# Patient Record
Sex: Female | Born: 1940
Health system: Southern US, Community
[De-identification: ages and names within clinical notes are randomized; demographics above are authoritative.]

## PROBLEM LIST (undated history)

## (undated) DIAGNOSIS — I639 Cerebral infarction, unspecified: Secondary | ICD-10-CM

## (undated) DIAGNOSIS — F419 Anxiety disorder, unspecified: Secondary | ICD-10-CM

## (undated) DIAGNOSIS — E7800 Pure hypercholesterolemia, unspecified: Secondary | ICD-10-CM

## (undated) DIAGNOSIS — I10 Essential (primary) hypertension: Secondary | ICD-10-CM

## (undated) DIAGNOSIS — K219 Gastro-esophageal reflux disease without esophagitis: Secondary | ICD-10-CM

## (undated) DIAGNOSIS — R011 Cardiac murmur, unspecified: Secondary | ICD-10-CM

## (undated) DIAGNOSIS — H9192 Unspecified hearing loss, left ear: Secondary | ICD-10-CM

## (undated) DIAGNOSIS — E039 Hypothyroidism, unspecified: Secondary | ICD-10-CM

## (undated) DIAGNOSIS — M1990 Unspecified osteoarthritis, unspecified site: Secondary | ICD-10-CM

## (undated) DIAGNOSIS — I350 Nonrheumatic aortic (valve) stenosis: Secondary | ICD-10-CM

## (undated) DIAGNOSIS — R112 Nausea with vomiting, unspecified: Secondary | ICD-10-CM

## (undated) DIAGNOSIS — C801 Malignant (primary) neoplasm, unspecified: Secondary | ICD-10-CM

## (undated) DIAGNOSIS — K5792 Diverticulitis of intestine, part unspecified, without perforation or abscess without bleeding: Secondary | ICD-10-CM

## (undated) DIAGNOSIS — F32A Depression, unspecified: Secondary | ICD-10-CM

## (undated) DIAGNOSIS — G5600 Carpal tunnel syndrome, unspecified upper limb: Secondary | ICD-10-CM

## (undated) DIAGNOSIS — F329 Major depressive disorder, single episode, unspecified: Secondary | ICD-10-CM

## (undated) DIAGNOSIS — F0781 Postconcussional syndrome: Secondary | ICD-10-CM

## (undated) DIAGNOSIS — R413 Other amnesia: Secondary | ICD-10-CM

## (undated) DIAGNOSIS — Z9889 Other specified postprocedural states: Secondary | ICD-10-CM

## (undated) HISTORY — PX: EYE SURGERY: SHX253

## (undated) HISTORY — DX: Diverticulitis of intestine, part unspecified, without perforation or abscess without bleeding: K57.92

## (undated) HISTORY — DX: Hypothyroidism, unspecified: E03.9

## (undated) HISTORY — PX: CHOLECYSTECTOMY: SHX55

## (undated) HISTORY — PX: CARDIAC CATHETERIZATION: SHX172

---

## 1984-02-19 HISTORY — PX: ABDOMINAL HYSTERECTOMY: SHX81

## 1993-02-18 HISTORY — PX: OTHER SURGICAL HISTORY: SHX169

## 1999-02-19 HISTORY — PX: OTHER SURGICAL HISTORY: SHX169

## 1999-10-26 ENCOUNTER — Ambulatory Visit (HOSPITAL_BASED_OUTPATIENT_CLINIC_OR_DEPARTMENT_OTHER): Admission: RE | Admit: 1999-10-26 | Discharge: 1999-10-26 | Payer: Self-pay | Admitting: Neurosurgery

## 1999-11-27 ENCOUNTER — Ambulatory Visit (HOSPITAL_COMMUNITY): Admission: RE | Admit: 1999-11-27 | Discharge: 1999-11-27 | Payer: Self-pay | Admitting: Neurosurgery

## 1999-11-27 ENCOUNTER — Encounter: Payer: Self-pay | Admitting: Neurosurgery

## 1999-12-19 ENCOUNTER — Encounter: Payer: Self-pay | Admitting: Neurosurgery

## 1999-12-19 ENCOUNTER — Ambulatory Visit (HOSPITAL_COMMUNITY): Admission: RE | Admit: 1999-12-19 | Discharge: 1999-12-19 | Payer: Self-pay | Admitting: Neurosurgery

## 2000-01-02 ENCOUNTER — Ambulatory Visit (HOSPITAL_COMMUNITY): Admission: RE | Admit: 2000-01-02 | Discharge: 2000-01-02 | Payer: Self-pay | Admitting: Neurosurgery

## 2000-01-02 ENCOUNTER — Encounter: Payer: Self-pay | Admitting: Neurosurgery

## 2001-02-18 HISTORY — PX: OTHER SURGICAL HISTORY: SHX169

## 2001-06-27 ENCOUNTER — Encounter: Payer: Self-pay | Admitting: Neurosurgery

## 2001-06-27 ENCOUNTER — Ambulatory Visit (HOSPITAL_COMMUNITY): Admission: RE | Admit: 2001-06-27 | Discharge: 2001-06-27 | Payer: Self-pay | Admitting: Neurosurgery

## 2001-07-16 ENCOUNTER — Encounter: Payer: Self-pay | Admitting: Neurosurgery

## 2001-07-16 ENCOUNTER — Encounter: Admission: RE | Admit: 2001-07-16 | Discharge: 2001-07-16 | Payer: Self-pay | Admitting: Neurosurgery

## 2001-08-04 ENCOUNTER — Encounter: Payer: Self-pay | Admitting: Neurosurgery

## 2001-08-04 ENCOUNTER — Encounter: Admission: RE | Admit: 2001-08-04 | Discharge: 2001-08-04 | Payer: Self-pay | Admitting: Neurosurgery

## 2001-08-31 ENCOUNTER — Encounter: Admission: RE | Admit: 2001-08-31 | Discharge: 2001-08-31 | Payer: Self-pay | Admitting: Neurosurgery

## 2001-08-31 ENCOUNTER — Encounter: Payer: Self-pay | Admitting: Neurosurgery

## 2002-01-14 ENCOUNTER — Inpatient Hospital Stay (HOSPITAL_COMMUNITY): Admission: EM | Admit: 2002-01-14 | Discharge: 2002-01-18 | Payer: Self-pay

## 2002-01-14 ENCOUNTER — Encounter: Payer: Self-pay | Admitting: Orthopedic Surgery

## 2002-01-15 ENCOUNTER — Encounter: Payer: Self-pay | Admitting: Orthopedic Surgery

## 2003-03-15 ENCOUNTER — Encounter: Admission: RE | Admit: 2003-03-15 | Discharge: 2003-03-15 | Payer: Self-pay | Admitting: Orthopedic Surgery

## 2007-02-19 HISTORY — PX: OTHER SURGICAL HISTORY: SHX169

## 2008-02-19 HISTORY — PX: BACK SURGERY: SHX140

## 2008-10-08 ENCOUNTER — Encounter: Admission: RE | Admit: 2008-10-08 | Discharge: 2008-10-08 | Payer: Self-pay | Admitting: Specialist

## 2008-12-14 ENCOUNTER — Ambulatory Visit (HOSPITAL_COMMUNITY): Admission: RE | Admit: 2008-12-14 | Discharge: 2008-12-15 | Payer: Self-pay | Admitting: Specialist

## 2010-05-24 LAB — URINALYSIS, ROUTINE W REFLEX MICROSCOPIC
Bilirubin Urine: NEGATIVE
Hgb urine dipstick: NEGATIVE
Ketones, ur: NEGATIVE mg/dL
Nitrite: NEGATIVE
Protein, ur: NEGATIVE mg/dL
Urobilinogen, UA: 0.2 mg/dL (ref 0.0–1.0)

## 2010-05-24 LAB — COMPREHENSIVE METABOLIC PANEL
ALT: 53 U/L — ABNORMAL HIGH (ref 0–35)
AST: 51 U/L — ABNORMAL HIGH (ref 0–37)
Albumin: 3.7 g/dL (ref 3.5–5.2)
CO2: 27 mEq/L (ref 19–32)
Calcium: 9.4 mg/dL (ref 8.4–10.5)
Creatinine, Ser: 0.92 mg/dL (ref 0.4–1.2)
Glucose, Bld: 110 mg/dL — ABNORMAL HIGH (ref 70–99)
Total Protein: 7.4 g/dL (ref 6.0–8.3)

## 2010-05-24 LAB — URINE MICROSCOPIC-ADD ON

## 2010-05-24 LAB — CBC
MCHC: 33.3 g/dL (ref 30.0–36.0)
MCV: 92.4 fL (ref 78.0–100.0)
Platelets: 208 10*3/uL (ref 150–400)
RBC: 5.17 MIL/uL — ABNORMAL HIGH (ref 3.87–5.11)
RDW: 13.7 % (ref 11.5–15.5)

## 2010-07-06 NOTE — Op Note (Signed)
NAMETERENA, BOHAN                         ACCOUNT NO.:  [id number]   MEDICAL RECORD NO.:  [id number]                   PATIENT TYPE:  INP   LOCATION:  0450                                 FACILITY:  [facility]   PHYSICIAN:  Dionne Ano. Everlene Other, M.D.         DATE OF BIRTH:  Jun 18, 1940   DATE OF PROCEDURE:  01/15/2002  DATE OF DISCHARGE:                                 OPERATIVE REPORT   PREOPERATIVE DIAGNOSES:  1. Status post fall with fracture-dislocation, left elbow with comminuted     complex fractures of the ulna and radius.  2. Right heel laceration.  3. Left rib fractures.   POSTOPERATIVE DIAGNOSES:  1. Status post fall with fracture-dislocation, left elbow with comminuted     complex fractures of the ulna and radius.  2. Right heel laceration.  3. Left rib fractures   PROCEDURES:  1. Open reduction and internal fixation comminuted complex left ulna     fracture about the olecranon.  2. Reduction of the dislocation, left elbow.  3. Open reduction and internal fixation left radial head fracture.  4. Cubital tunnel release in situ (ulnar nerve release at the elbow), left     upper extremity.  5. Stress radiography.   SURGEON:  Dionne Ano. Amanda Pea, M.D.   ASSISTANT:  Roma Schanz, P.A.   ANESTHESIA:  General.   TOURNIQUET TIME:  Less than two hours.   ESTIMATED BLOOD LOSS:  Less than 75 cc.   DRAINS:  One.   COMPLICATIONS:  None.   INDICATIONS FOR THE PROCEDURE:  This patient is a very pleasant 70 year old  female who fell down stairs on 01/14/02.  She was seen and admitted to the  hospital by myself.  Following this, the patent underwent preoperative  workup which revealed the fracture-dislocation as noted.  She underwent a  plain radiograph and CT scan for preop evaluation purposes and was then  taken to the operative suite on 01/15/02 with the risks and benefits  outlined to her at length.  I specifically discussed with her risks of  bleeding,  infection, anesthesia, damage to normal structures, and failure of  the surgery to __________ restoring function.  With this mind, she desires  to proceed.  All questions have been encouraged and answered preoperatively.   OPERATIVE PROCEDURE IN DETAIL:  The patient was seen by myself and  anesthesia, underwent consent and was then given preoperative antibiotics  and taken to the operative suite where she underwent a general anesthetic  under the direction of Mack Guise, M.D.  She was laid supine and  appropriately padded.  I then held the arm and removed the previous splint  which was applied in the emergency room, and then performed a sterile prep  and drape after turning the patient into the lateral decubitus position with  all pressure points well padded and a bean bag holding her in position.  I  taped the necessary areas so  that there would be good security of the body.  Once this was done, the patient underwent sterile draping.  Once sterile  draping was accomplished, the arm was elevated, and tourniquet was  insufflated to 250 mmHg.  The patient then underwent a posterior midline  incision.  Dissection was carried through the skin with a knife blade.  Subcu was incised.  The triceps fascia was identified and a plane between  the fascia and subcu was created medially and laterally without difficulty.  This was done sharply under 4.0 loupe magnification.  Fracture site was  identified.  The fracture site was irrigated, and following irrigation of  the fracture site, the patient underwent skin flap creation to my  satisfaction and then underwent identification of the fracture.  I palpated  the ulnar nerve and following this, noted that the patient had recurred her  dislocation tendency from her reduction previously performed in the  emergency room.  I then very carefully curettaged and identified the  fracture site about the proximal ulna which was comminuted and complex.  The   patient had a large free fragment in the wound itself.  She also had a  dislocation of the radiocapitellar joint with a large portion of the radial  head in the soft tissue well proximal to this.  At this point in time, I  created a Kocher interval between the anconeus and ECU.  I captured the free  fragment which was one third of the radial head articular surface.  This was  free floating in the soft tissues.  I saved this for later repair.  Once  this was done, I irrigated the radiocapitellar joint removing loose bodies  and foreign debris and following removal of this, then performed reduction  of the radiocapitellar joint to my satisfaction.  This reduced nicely.  Following this, it was held in this position, and then the patient underwent  a very complex ORIF of the proximal ulna with an Accumed plate.  This was  done by initially achieving provisional fixation with K-wire, placing the  plate, checking x-rays, under live fluoroscopy, and then performing  application of the plate with compression mode in mind.  This was done to my  satisfaction without difficulty.  It held the fracture nicely.  I should  note that I took meticulous care and performed tedious dissection in gaining  anatomic restoration of the ulna.  This had a significant dislocation  tendency prior to the ORIF.  Once the ORIF was complete to my satisfaction,  the patient did have good stability for the most part.  I was pleased with  the ORIF, the screw and plate position.  I should note that the most crucial  screw was a bicortical screw which did engage the anterior portion of the  ulna, and this was approximately a 60-mm screw.  This captured the ulna  nicely and provided excellent fixation.  I additionally placed some Vicryl  suture around periosteal edges where comminution was present.  Following  this, I then noted that the patient did have a significant amount of swelling in the cubital tunnel region and thus, I  released the ulnar nerve  in situ.  This was done to my satisfaction without difficulty, releasing the  ulnar nerve, arcade of Struthers, and, of course, the cubital tunnel and two  heads of the FCU.  This nerve sat nicely without constriction.  There was a  bloody hematoma noted in the canal (cubital tunnel).  With this  performed, I  then turned attention back towards the radial head.  I noted that the elbow  was most stable with the forearm in supination due to the fracture  configuration about the radial head.  I took care to preserve the lateral  ulna/humeral ligament complex and following this, performed ORIF of the  radial head with a [facility] headless screw which was countersunk below  the surface of the articular margin.  This was done under 4.0 loupe  magnification with fluoroscopic control.  I should note that I was very  pleased with the purchase and placement.  There was some comminution distal  to this radial head.  I should note that this comminution was present from  the original fracture-dislocation itself.  Once the radial head and ulna  underwent ORIF as necessary, I then performed live fluoroscopy to ensure  stability.  The patient was stable at 30 degrees extension and full flexion,  headache no medial-lateral instability.  Once this was noted, I then  deflated the tourniquet.  Following securing hemostasis, I closed the Kocher  interval with Fibrewire suture and following this, I covered portions of the  plate with Vicryl followed by closure of the subcu with 2-0 and 3-0 Vicryl.  I did place a medium Hemovac drain to allow for egress of fluid.  I then  closed the skin with staple clips.  Adaptic was placed followed by placement  of a long arm posterior plaster splint with a Boyd bridge.  This was done to  my satisfaction without difficulty.  She had excellent refill and good pulse  following this.  She was carefully transported to a recovery room bed after   extubation and was then transferred to the recovery room where she was noted  to be  stable, awake, alert, and oriented after a period of observation and  neurovascularly intact.  Will monitor her care closely, continue her status  as an inpatient and proceed as necessary.  All questions have been  encouraged and answered with the family.                                               Dionne Ano. Everlene Other, M.D.    Nash Mantis  D:  01/16/2002  T:  01/16/2002  Job:  [id number]

## 2010-07-06 NOTE — Op Note (Signed)
[place] [facility]  Patient:    Cheryl Cooper, Cheryl Cooper                      MRN: [id number] Proc. Date: 10/26/99 Adm. Date:  10/26/99 Attending:  Donalee Citrin P                           Operative Report  PREOPERATIVE DIAGNOSIS:  Right carpal tunnel syndrome.  POSTOPERATIVE DIAGNOSIS:  Right carpal tunnel syndrome.  PROCEDURE:  Right carpal tunnel release.  ANESTHESIA:  MAC with bier block.  SURGEON:  Donzetta Sprung. Roney Jaffe., MD  OPERATIVE PROCEDURE:  The patient was brought in the operating room and was induced under MAC anesthesia with bier block placed, and tourniquet applied. The right hand was prepped and draped in usual sterile fashion and placed in the hand holder.  An incision was drawn out just on the ulnar side of the palmar crease and this was incised with a 15 blade scalpel.  Then Metzenbaum and hemostats were used to dissect down to the flexor retinaculum.  This was isolated and then incised with a 15 blade scalpel after the tendon was incised all the way through to the outer surface of the nerve sheath was visualized.  Hemostats were used to free up the tendon from the nerve sheath and this was carefully divided with Metzenbaum scissors both extending towards the distal end towards the fingers as well as this procedure was repeated proximally back towards the wrist.  At the end of the incision of the tendon the hemostats were placed overlying the nerve both deepening the incision towards the fingers and proximally back to where there was noted to be no further compression on the median nerve.  The median nerve was noted to be intact.  Care was taken not to disrupt any of the cutaneous branches as well as to avoid the median recurrent if visualized. This was not visualized and felt to be more radial to its takeoff.  At the end of the case the wound as copiously irrigated and closed.  Skin was approximated with 3-0 nylon Dermalon suture in an  interrupted vertical mattress fashion.  Then the wound was dressed with fluffs and an Ace wrap and patient was to follow up in a week.  At the end of the case, all needle counts and sponge counts were correct, and the patient was sent to the recovery room in stable condition. DD:  11/07/99 TD:  11/08/99 Job: [id number]

## 2010-07-06 NOTE — Discharge Summary (Signed)
NAMELAQUANTA, Cheryl Cooper                         ACCOUNT NO.:  [id number]   MEDICAL RECORD NO.:  [id number]                   PATIENT TYPE:  INP   LOCATION:  0450                                 FACILITY:  [facility]   PHYSICIAN:  Dionne Ano. Everlene Other, M.D.         DATE OF BIRTH:  1940-12-18   DATE OF ADMISSION:  01/14/2002  DATE OF DISCHARGE:  01/18/2002                                 DISCHARGE SUMMARY   HISTORY:  The patient was admitted to [facility] secondary to  multiple injuries, status post fall down a flight of stairs.  The patient  was seen and diagnosed with a fracture dislocation of her left elbow,  laceration to the right heel, and left rib fractures.   HOSPITAL COURSE:  She was admitted to the hospital and following admission  on January 14, 2002, was taken to the operative suite on January 15, 2002,  where she underwent a complex open reduction and internal fixation of her  left elbow with plate and screws, as well as head screw fixation of the  radial head and in situ decompression.  She tolerated the surgery well  without difficulty and had no postoperative complications.   She was stable on postoperative day #1, #2, and #3.  She remained  hospitalized for pain management and therapeutic endeavors.  At the time of  her discharge, her right heel was clean, dry, and intact.  Her left upper  extremity examination revealed a neurovascular examination which was normal.  She had a splint maintained and will continue this splint.  The patient  still has some pain with her left rib fractures with deep inspiration.  However, she is using her incentive spirometer quite well and pushing up to  2000 on it.  She will continue this.  At the present time, she is afebrile.  Vital signs are stable.  She is tolerating a regular diet.  She is stable  for discharge.   DISPOSITION:  She will be discharged home.   FOLLOWUP:  She will return to see [place] in the office in ten days  with a  therapy appointment immediately following.   DISCHARGE MEDICATIONS:  1. Percocet to be taken as directed.  2. Robaxin to be taken as directed.   DISCHARGE INSTRUCTIONS:  I have discussed her dos and do nots, etc.  She  will have a sling for discharge.  She will notify us should any unanswered  questions, problems, or concerns arise.   DISCHARGE DIAGNOSES:  1. Fracture dislocation of the left elbow; status post open reduction     internal fixation January 15, 2002.  2. Right heel laceration.  3. Left rib fractures.  Dionne Ano. Everlene Other, M.D.    Nash Mantis  D:  01/18/2002  T:  01/18/2002  Job:  [id number]

## 2011-11-08 ENCOUNTER — Encounter (HOSPITAL_COMMUNITY): Payer: Self-pay | Admitting: Pharmacy Technician

## 2011-11-08 NOTE — Progress Notes (Signed)
NEED ORDERS FOR 9-30 PRE OP

## 2011-11-15 NOTE — Patient Instructions (Addendum)
20 Cheryl Cooper  11/15/2011   Your procedure is scheduled on:  11-21-2011  Report to [facility] at 0530  AM.  Call this number if you have problems the morning of surgery: [phone]   Remember:   Do not eat food or drink liquids:After Midnight.  .  Take these medicines the morning of surgery with A SIP OF WATER: synthroid, omeprazole, lamotrigene   Do not wear jewelry or make up.  Do not wear lotions, powders, or perfumes. You may wear deodorant.    Do not bring valuables to the hospital.  Contacts, dentures or bridgework may not be worn into surgery.  Leave suitcase in the car. After surgery it may be brought to your room.  For patients admitted to the hospital, checkout time is 11:00 AM the day of discharge                             Patients discharged the day of surgery will not be allowed to drive home. If going home same day of surgery, you must have someone stay with you the first 24 hours at home and arrange for some one to drive you home from hospital.    Special Instructions: See [facility] Preparing for Surgery instruction sheet. Women do not shave legs or underarms for 12 hours before showers. Men may shave face morning of surgery.    Please read over the following fact sheets that you were given: MRSA Information  [facility] pre op nurse phone number [phone], call if needed

## 2011-11-18 ENCOUNTER — Ambulatory Visit (HOSPITAL_COMMUNITY)
Admission: RE | Admit: 2011-11-18 | Discharge: 2011-11-18 | Disposition: A | Discharge status: Home / Self Care | Payer: Medicare Other | Source: Ambulatory Visit | Attending: Specialist | Admitting: Specialist

## 2011-11-18 ENCOUNTER — Encounter (HOSPITAL_COMMUNITY): Payer: Self-pay

## 2011-11-18 ENCOUNTER — Encounter (HOSPITAL_COMMUNITY)
Admission: RE | Admit: 2011-11-18 | Discharge: 2011-11-18 | Disposition: A | Discharge status: Home / Self Care | Payer: Medicare Other | Source: Ambulatory Visit | Attending: Specialist | Admitting: Specialist

## 2011-11-18 DIAGNOSIS — Z01818 Encounter for other preprocedural examination: Secondary | ICD-10-CM | POA: Insufficient documentation

## 2011-11-18 DIAGNOSIS — Z01812 Encounter for preprocedural laboratory examination: Secondary | ICD-10-CM | POA: Insufficient documentation

## 2011-11-18 DIAGNOSIS — Z01810 Encounter for preprocedural cardiovascular examination: Secondary | ICD-10-CM | POA: Insufficient documentation

## 2011-11-18 HISTORY — DX: Gastro-esophageal reflux disease without esophagitis: K21.9

## 2011-11-18 HISTORY — DX: Other specified postprocedural states: Z98.890

## 2011-11-18 HISTORY — DX: Nausea with vomiting, unspecified: R11.2

## 2011-11-18 HISTORY — DX: Essential (primary) hypertension: I10

## 2011-11-18 HISTORY — DX: Unspecified hearing loss, left ear: H91.92

## 2011-11-18 LAB — CBC
MCV: 88.2 fL (ref 78.0–100.0)
Platelets: 220 10*3/uL (ref 150–400)
RDW: 13.6 % (ref 11.5–15.5)
WBC: 6.3 10*3/uL (ref 4.0–10.5)

## 2011-11-18 LAB — BASIC METABOLIC PANEL
Calcium: 9.6 mg/dL (ref 8.4–10.5)
Chloride: 103 mEq/L (ref 96–112)
Creatinine, Ser: 0.86 mg/dL (ref 0.50–1.10)
GFR calc Af Amer: 78 mL/min — ABNORMAL LOW (ref >90)

## 2011-11-21 ENCOUNTER — Encounter (HOSPITAL_COMMUNITY): Payer: Self-pay | Admitting: *Deleted

## 2011-11-21 ENCOUNTER — Ambulatory Visit (HOSPITAL_COMMUNITY): Payer: Medicare Other | Admitting: Anesthesiology

## 2011-11-21 ENCOUNTER — Encounter (HOSPITAL_COMMUNITY)
Admission: RE | Disposition: A | Discharge status: Home / Self Care | Payer: Self-pay | Source: Ambulatory Visit | Attending: Specialist

## 2011-11-21 ENCOUNTER — Encounter (HOSPITAL_COMMUNITY): Payer: Self-pay | Admitting: Anesthesiology

## 2011-11-21 ENCOUNTER — Observation Stay (HOSPITAL_COMMUNITY)
Admission: RE | Admit: 2011-11-21 | Discharge: 2011-11-22 | Disposition: A | Discharge status: Home / Self Care | Payer: Medicare Other | Source: Ambulatory Visit | Attending: Specialist | Admitting: Specialist

## 2011-11-21 DIAGNOSIS — I10 Essential (primary) hypertension: Secondary | ICD-10-CM | POA: Insufficient documentation

## 2011-11-21 DIAGNOSIS — M25819 Other specified joint disorders, unspecified shoulder: Secondary | ICD-10-CM | POA: Insufficient documentation

## 2011-11-21 DIAGNOSIS — E119 Type 2 diabetes mellitus without complications: Secondary | ICD-10-CM | POA: Insufficient documentation

## 2011-11-21 DIAGNOSIS — H9190 Unspecified hearing loss, unspecified ear: Secondary | ICD-10-CM | POA: Insufficient documentation

## 2011-11-21 DIAGNOSIS — Z79899 Other long term (current) drug therapy: Secondary | ICD-10-CM | POA: Insufficient documentation

## 2011-11-21 DIAGNOSIS — S43429A Sprain of unspecified rotator cuff capsule, initial encounter: Principal | ICD-10-CM | POA: Insufficient documentation

## 2011-11-21 DIAGNOSIS — M75101 Unspecified rotator cuff tear or rupture of right shoulder, not specified as traumatic: Secondary | ICD-10-CM | POA: Diagnosis present

## 2011-11-21 DIAGNOSIS — X58XXXA Exposure to other specified factors, initial encounter: Secondary | ICD-10-CM | POA: Insufficient documentation

## 2011-11-21 DIAGNOSIS — K219 Gastro-esophageal reflux disease without esophagitis: Secondary | ICD-10-CM | POA: Insufficient documentation

## 2011-11-21 HISTORY — PX: SHOULDER OPEN ROTATOR CUFF REPAIR: SHX2407

## 2011-11-21 LAB — GLUCOSE, CAPILLARY
Glucose-Capillary: 104 mg/dL — ABNORMAL HIGH (ref 70–99)
Glucose-Capillary: 154 mg/dL — ABNORMAL HIGH (ref 70–99)
Glucose-Capillary: 167 mg/dL — ABNORMAL HIGH (ref 70–99)
Glucose-Capillary: 157 mg/dL — ABNORMAL HIGH (ref 70–99)

## 2011-11-21 SURGERY — REPAIR, ROTATOR CUFF, OPEN
Anesthesia: General | Site: Shoulder | Laterality: Right | Wound class: Clean

## 2011-11-21 MED ORDER — ACETAMINOPHEN 650 MG RE SUPP: 650.0000 mg | Freq: Four times a day (QID) | RECTAL | Status: DC | PRN

## 2011-11-21 MED ORDER — ACETAMINOPHEN 10 MG/ML IV SOLN: 1000.0000 mg | Freq: Once | INTRAVENOUS | Status: DC | PRN

## 2011-11-21 MED ORDER — CEFAZOLIN SODIUM-DEXTROSE 2-3 GM-% IV SOLR
INTRAVENOUS | Status: DC | PRN
  Administered 2011-11-21: 2 g via INTRAVENOUS

## 2011-11-21 MED ORDER — INSULIN ASPART 100 UNIT/ML ~~LOC~~ SOLN
0.0000 [IU] | Freq: Three times a day (TID) | SUBCUTANEOUS | Status: DC
  Administered 2011-11-21 (×2): 2 [IU] via SUBCUTANEOUS
  Administered 2011-11-22 (×2): 1 [IU] via SUBCUTANEOUS

## 2011-11-21 MED ORDER — METHOCARBAMOL 500 MG PO TABS
500.0000 mg | ORAL_TABLET | Freq: Four times a day (QID) | ORAL | Status: DC | PRN
  Administered 2011-11-21: 500 mg via ORAL
  Filled 2011-11-21: qty 1

## 2011-11-21 MED ORDER — MEPERIDINE HCL 50 MG/ML IJ SOLN: 6.2500 mg | INTRAMUSCULAR | Status: DC | PRN

## 2011-11-21 MED ORDER — HYDROCODONE-ACETAMINOPHEN 5-325 MG PO TABS: 1.0000 | ORAL_TABLET | ORAL | Status: DC | PRN

## 2011-11-21 MED ORDER — SODIUM CHLORIDE 0.45 % IV SOLN
INTRAVENOUS | Status: AC
  Administered 2011-11-21: 17:00:00 via INTRAVENOUS

## 2011-11-21 MED ORDER — LACTATED RINGERS IV SOLN
INTRAVENOUS | Status: DC | PRN
  Administered 2011-11-21 (×2): via INTRAVENOUS

## 2011-11-21 MED ORDER — ONDANSETRON HCL 4 MG/2ML IJ SOLN
4.0000 mg | Freq: Four times a day (QID) | INTRAMUSCULAR | Status: DC | PRN
  Administered 2011-11-21: 4 mg via INTRAVENOUS
  Filled 2011-11-21: qty 2

## 2011-11-21 MED ORDER — LIDOCAINE HCL (CARDIAC) 20 MG/ML IV SOLN
INTRAVENOUS | Status: DC | PRN
  Administered 2011-11-21: 80 mg via INTRAVENOUS

## 2011-11-21 MED ORDER — KETOROLAC TROMETHAMINE 15 MG/ML IJ SOLN
INTRAMUSCULAR | Status: DC | PRN
  Administered 2011-11-21: 15 mg via INTRAVENOUS

## 2011-11-21 MED ORDER — BUPIVACAINE-EPINEPHRINE (PF) 0.5% -1:200000 IJ SOLN
INTRAMUSCULAR | Status: AC
  Filled 2011-11-21: qty 10

## 2011-11-21 MED ORDER — LOSARTAN POTASSIUM 25 MG PO TABS
25.0000 mg | ORAL_TABLET | Freq: Every day | ORAL | Status: DC
  Administered 2011-11-21 – 2011-11-22 (×2): 25 mg via ORAL
  Filled 2011-11-21 (×3): qty 1

## 2011-11-21 MED ORDER — MORPHINE SULFATE 2 MG/ML IJ SOLN: 1.0000 mg | INTRAMUSCULAR | Status: DC | PRN

## 2011-11-21 MED ORDER — ACETAMINOPHEN 325 MG PO TABS: 650.0000 mg | ORAL_TABLET | Freq: Four times a day (QID) | ORAL | Status: DC | PRN

## 2011-11-21 MED ORDER — DEXTROSE 5 % IV SOLN
500.0000 mg | Freq: Four times a day (QID) | INTRAVENOUS | Status: DC | PRN
  Administered 2011-11-22: 500 mg via INTRAVENOUS
  Filled 2011-11-21: qty 5

## 2011-11-21 MED ORDER — PROMETHAZINE HCL 25 MG/ML IJ SOLN: 6.2500 mg | INTRAMUSCULAR | Status: DC | PRN

## 2011-11-21 MED ORDER — PANTOPRAZOLE SODIUM 40 MG PO TBEC
40.0000 mg | DELAYED_RELEASE_TABLET | Freq: Every day | ORAL | Status: DC
  Administered 2011-11-22: 40 mg via ORAL
  Filled 2011-11-21 (×2): qty 1

## 2011-11-21 MED ORDER — ROCURONIUM BROMIDE 100 MG/10ML IV SOLN
INTRAVENOUS | Status: DC | PRN
  Administered 2011-11-21: 40 mg via INTRAVENOUS

## 2011-11-21 MED ORDER — LEVOTHYROXINE SODIUM 75 MCG PO TABS
75.0000 ug | ORAL_TABLET | Freq: Every day | ORAL | Status: DC
  Administered 2011-11-22: 75 ug via ORAL
  Filled 2011-11-21 (×2): qty 1

## 2011-11-21 MED ORDER — ENOXAPARIN SODIUM 30 MG/0.3ML ~~LOC~~ SOLN
30.0000 mg | Freq: Two times a day (BID) | SUBCUTANEOUS | Status: AC
  Administered 2011-11-22: 30 mg via SUBCUTANEOUS
  Filled 2011-11-21: qty 0.3

## 2011-11-21 MED ORDER — OXYCODONE-ACETAMINOPHEN 5-325 MG PO TABS
1.0000 | ORAL_TABLET | ORAL | Status: DC | PRN
  Administered 2011-11-21: 1 via ORAL
  Administered 2011-11-21: 2 via ORAL
  Filled 2011-11-21 (×2): qty 2

## 2011-11-21 MED ORDER — GLYCOPYRROLATE 0.2 MG/ML IJ SOLN
INTRAMUSCULAR | Status: DC | PRN
  Administered 2011-11-21: 0.4 mg via INTRAVENOUS

## 2011-11-21 MED ORDER — PHENOL 1.4 % MT LIQD
1.0000 | OROMUCOSAL | Status: DC | PRN
  Filled 2011-11-21: qty 177

## 2011-11-21 MED ORDER — SODIUM CHLORIDE 0.9 % IR SOLN
Status: DC | PRN
  Administered 2011-11-21: 07:00:00

## 2011-11-21 MED ORDER — DOCUSATE SODIUM 100 MG PO CAPS
100.0000 mg | ORAL_CAPSULE | Freq: Two times a day (BID) | ORAL | Status: DC
  Administered 2011-11-21 – 2011-11-22 (×3): 100 mg via ORAL

## 2011-11-21 MED ORDER — EZETIMIBE-SIMVASTATIN 10-80 MG PO TABS
1.0000 | ORAL_TABLET | Freq: Every day | ORAL | Status: DC
  Administered 2011-11-21: 1 via ORAL
  Filled 2011-11-21 (×2): qty 1

## 2011-11-21 MED ORDER — OXYCODONE HCL 5 MG/5ML PO SOLN
5.0000 mg | Freq: Once | ORAL | Status: DC | PRN
  Filled 2011-11-21: qty 5

## 2011-11-21 MED ORDER — CEFAZOLIN SODIUM-DEXTROSE 2-3 GM-% IV SOLR
INTRAVENOUS | Status: AC
  Filled 2011-11-21: qty 50

## 2011-11-21 MED ORDER — ONDANSETRON HCL 4 MG PO TABS: 4.0000 mg | ORAL_TABLET | Freq: Four times a day (QID) | ORAL | Status: DC | PRN

## 2011-11-21 MED ORDER — CEFAZOLIN SODIUM-DEXTROSE 2-3 GM-% IV SOLR
2.0000 g | Freq: Four times a day (QID) | INTRAVENOUS | Status: AC
  Administered 2011-11-21 (×2): 2 g via INTRAVENOUS
  Filled 2011-11-21 (×2): qty 50

## 2011-11-21 MED ORDER — HYDROMORPHONE HCL PF 1 MG/ML IJ SOLN
INTRAMUSCULAR | Status: AC
  Filled 2011-11-21: qty 1

## 2011-11-21 MED ORDER — BUPIVACAINE-EPINEPHRINE 0.5% -1:200000 IJ SOLN
INTRAMUSCULAR | Status: DC | PRN
  Administered 2011-11-21: 10 mL

## 2011-11-21 MED ORDER — FENTANYL CITRATE 0.05 MG/ML IJ SOLN
INTRAMUSCULAR | Status: DC | PRN
  Administered 2011-11-21: 100 ug via INTRAVENOUS
  Administered 2011-11-21: 50 ug via INTRAVENOUS
  Administered 2011-11-21: 100 ug via INTRAVENOUS

## 2011-11-21 MED ORDER — PROPOFOL 10 MG/ML IV BOLUS
INTRAVENOUS | Status: DC | PRN
  Administered 2011-11-21: 160 mg via INTRAVENOUS

## 2011-11-21 MED ORDER — METOCLOPRAMIDE HCL 5 MG/ML IJ SOLN
5.0000 mg | Freq: Three times a day (TID) | INTRAMUSCULAR | Status: DC | PRN
  Administered 2011-11-21: 10 mg via INTRAVENOUS
  Filled 2011-11-21: qty 2

## 2011-11-21 MED ORDER — HYDROMORPHONE HCL PF 1 MG/ML IJ SOLN
0.2500 mg | INTRAMUSCULAR | Status: DC | PRN
  Administered 2011-11-21 (×3): 0.25 mg via INTRAVENOUS

## 2011-11-21 MED ORDER — KETOROLAC TROMETHAMINE 30 MG/ML IJ SOLN
30.0000 mg | Freq: Four times a day (QID) | INTRAMUSCULAR | Status: DC
  Administered 2011-11-21 – 2011-11-22 (×5): 30 mg via INTRAVENOUS
  Filled 2011-11-21 (×8): qty 1

## 2011-11-21 MED ORDER — OXYCODONE HCL 5 MG PO TABS: 5.0000 mg | ORAL_TABLET | Freq: Once | ORAL | Status: DC | PRN

## 2011-11-21 MED ORDER — METOCLOPRAMIDE HCL 10 MG PO TABS: 5.0000 mg | ORAL_TABLET | Freq: Three times a day (TID) | ORAL | Status: DC | PRN

## 2011-11-21 MED ORDER — LACTATED RINGERS IV SOLN
INTRAVENOUS | Status: DC
  Administered 2011-11-21: 1000 mL via INTRAVENOUS

## 2011-11-21 MED ORDER — EPHEDRINE SULFATE 50 MG/ML IJ SOLN
INTRAMUSCULAR | Status: DC | PRN
  Administered 2011-11-21 (×2): 10 mg via INTRAVENOUS
  Administered 2011-11-21: 5 mg via INTRAVENOUS

## 2011-11-21 MED ORDER — MENTHOL 3 MG MT LOZG
1.0000 | LOZENGE | OROMUCOSAL | Status: DC | PRN
  Filled 2011-11-21: qty 9

## 2011-11-21 MED ORDER — NEOSTIGMINE METHYLSULFATE 1 MG/ML IJ SOLN
INTRAMUSCULAR | Status: DC | PRN
  Administered 2011-11-21: 3 mg via INTRAVENOUS

## 2011-11-21 MED ORDER — LAMOTRIGINE 100 MG PO TABS
100.0000 mg | ORAL_TABLET | Freq: Every morning | ORAL | Status: DC
  Administered 2011-11-22: 100 mg via ORAL
  Filled 2011-11-21: qty 1

## 2011-11-21 MED ORDER — ONDANSETRON HCL 4 MG/2ML IJ SOLN
INTRAMUSCULAR | Status: DC | PRN
  Administered 2011-11-21: 4 mg via INTRAVENOUS

## 2011-11-21 MED ORDER — DEXAMETHASONE SODIUM PHOSPHATE 10 MG/ML IJ SOLN
INTRAMUSCULAR | Status: DC | PRN
  Administered 2011-11-21: 10 mg via INTRAVENOUS

## 2011-11-21 MED ORDER — MIDAZOLAM HCL 5 MG/5ML IJ SOLN
INTRAMUSCULAR | Status: DC | PRN
  Administered 2011-11-21 (×2): 1 mg via INTRAVENOUS

## 2011-11-21 SURGICAL SUPPLY — 49 items
ANCHOR ROTATOR CUFF #2 (Anchor) ×8 IMPLANT
BAG ZIPLOCK 12X15 (MISCELLANEOUS) ×2 IMPLANT
BENZOIN TINCTURE PRP APPL 2/3 (GAUZE/BANDAGES/DRESSINGS) IMPLANT
BLADE OSCILLATING/SAGITTAL (BLADE) ×1
BLADE SW THK.38XMED LNG THN (BLADE) ×1 IMPLANT
BNDG COHESIVE 4X5 WHT NS (GAUZE/BANDAGES/DRESSINGS) ×2 IMPLANT
BUR OVAL CARBIDE 4.0 (BURR) ×2 IMPLANT
CHLORAPREP W/TINT 26ML (MISCELLANEOUS) IMPLANT
CLEANER TIP ELECTROSURG 2X2 (MISCELLANEOUS) ×2 IMPLANT
CLOTH BEACON ORANGE TIMEOUT ST (SAFETY) ×2 IMPLANT
CLSR STERI-STRIP ANTIMIC 1/2X4 (GAUZE/BANDAGES/DRESSINGS) ×2 IMPLANT
DECANTER SPIKE VIAL GLASS SM (MISCELLANEOUS) ×2 IMPLANT
DRAPE ORTHO SPLIT 77X108 STRL (DRAPES) ×1
DRAPE POUCH INSTRU U-SHP 10X18 (DRAPES) ×2 IMPLANT
DRAPE SURG ORHT 6 SPLT 77X108 (DRAPES) ×1 IMPLANT
DRSG EMULSION OIL 3X3 NADH (GAUZE/BANDAGES/DRESSINGS) ×2 IMPLANT
DRSG PAD ABDOMINAL 8X10 ST (GAUZE/BANDAGES/DRESSINGS) ×2 IMPLANT
DURAPREP 26ML APPLICATOR (WOUND CARE) ×2 IMPLANT
ELECT NEEDLE TIP 2.8 STRL (NEEDLE) ×2 IMPLANT
ELECT REM PT RETURN 9FT ADLT (ELECTROSURGICAL) ×2
ELECTRODE REM PT RTRN 9FT ADLT (ELECTROSURGICAL) ×1 IMPLANT
GLOVE BIOGEL PI IND STRL 8 (GLOVE) ×1 IMPLANT
GLOVE BIOGEL PI INDICATOR 8 (GLOVE) ×1
GLOVE INDICATOR 6.5 STRL GRN (GLOVE) IMPLANT
GLOVE SURG SS PI 8.0 STRL IVOR (GLOVE) ×4 IMPLANT
GOWN PREVENTION PLUS LG XLONG (DISPOSABLE) ×2 IMPLANT
GOWN STRL REIN XL XLG (GOWN DISPOSABLE) ×2 IMPLANT
KIT BASIN OR (CUSTOM PROCEDURE TRAY) ×2 IMPLANT
MANIFOLD NEPTUNE II (INSTRUMENTS) ×2 IMPLANT
NEEDLE MA TROC 1/2 (NEEDLE) IMPLANT
NEEDLE MA TROC 1/2 CIR (NEEDLE) IMPLANT
PACK SHOULDER CUSTOM OPM052 (CUSTOM PROCEDURE TRAY) ×2 IMPLANT
POSITIONER SURGICAL ARM (MISCELLANEOUS) ×2 IMPLANT
PUSHLOCK PEEK 4.5X24 (Orthopedic Implant) ×4 IMPLANT
SLING ARM IMMOBILIZER LRG (SOFTGOODS) IMPLANT
SLING ULTRA II S (ORTHOPEDIC SUPPLIES) ×2 IMPLANT
SPONGE GAUZE 4X4 12PLY (GAUZE/BANDAGES/DRESSINGS) ×2 IMPLANT
SPONGE LAP 4X18 X RAY DECT (DISPOSABLE) ×2 IMPLANT
STRIP CLOSURE SKIN 1/2X4 (GAUZE/BANDAGES/DRESSINGS) ×2 IMPLANT
SUT BONE WAX W31G (SUTURE) ×2 IMPLANT
SUT ETHIBOND 0 (SUTURE) IMPLANT
SUT ETHIBOND 2 OS 4 DA (SUTURE) IMPLANT
SUT PROLENE 3 0 PS 2 (SUTURE) ×2 IMPLANT
SUT VIC AB 1 CT1 27 (SUTURE) ×1
SUT VIC AB 1 CT1 27XBRD ANTBC (SUTURE) ×1 IMPLANT
SUT VIC AB 1-0 CT2 27 (SUTURE) ×2 IMPLANT
SUT VIC AB 2-0 CT2 27 (SUTURE) ×2 IMPLANT
SUT VICRYL 0 UR6 27IN ABS (SUTURE) ×4 IMPLANT
SUT VICRYL 0-0 OS 2 NEEDLE (SUTURE) IMPLANT

## 2011-11-21 NOTE — Progress Notes (Signed)
Utilization review completed.  

## 2011-11-21 NOTE — Op Note (Signed)
NAMELITSY, EPTING               ACCOUNT NO.:  [id number]  MEDICAL RECORD NO.:  [id number]  LOCATION:  1621                         FACILITY:  [facility]  PHYSICIAN:  Jene Every, M.D.    DATE OF BIRTH:  [birth year]  DATE OF PROCEDURE:  11/21/2011 DATE OF DISCHARGE:                              OPERATIVE REPORT   PREOPERATIVE DIAGNOSES:  Retracted rotator cuff tear, biceps tendon, right shoulder, and impingement syndrome.  POSTOPERATIVE DIAGNOSES:  Retracted rotator cuff tear, biceps tendon, right shoulder, and impingement syndrome.  PROCEDURE PERFORMED: 1. Mini open rotator cuff repair utilizing Mitek suture anchors and     push lock. 2. Subacromial decompression. 3. Biceps tenodesis.  ANESTHESIA:  General.  ASSISTANT:  Skip Mayer, PA.  HISTORY:  A 71 year old, retracted tear, painful rotator cuff, indicated for decompression and repair.  Risk and benefits discussed including bleeding, infection, damage to neurovascular structures, suboptimal range of motion, recurrent tear, nonunion, etc.  TECHNIQUE:  With the patient in supine beach-chair position, after induction of adequate general anesthesia, 2 g Kefzol, the right shoulder and upper extremity was prepped and draped in usual sterile fashion.  A small incision was made over the anterolateral aspect of the acromion. Subcutaneous tissue was dissected.  Electrocautery was utilized to achieve hemostasis.  A 0.25% Marcaine with epinephrine was infiltrated in the subcutaneous tissue and in the deltoid.  The raphe between the anterolateral heads were identified and divided in line with skin incision 2 cm, elevated from the anterolateral and anteromedial aspect of the acromion, preserving the deltoid attachment.  Large anterolateral spur removed with 3 mm Kerrison.  Detached CA ligament.  Bare bone was noted in the rotator cuff repair, infraspinatus and supraspinatus subluxation of biceps medially.  We spent considerable time  meticulously freeing up the rotator cuff infraspinatus, supraspinatus to the level of glenoid.  Irrigating the wound, there was some attenuation of the tendon noted.  A Baer rongeur was utilized to perform a bony trough in the lateral aspect of the articular surface both posteriorly and anteriorly. I advanced the leaflet of the infraspinatus and supraspinatus anterolaterally, placed 2 Mitek suture anchors at the lateral aspect of the articular surface, threaded up them through the advanced tendon into the trough and sutured them with a good surgical knot.  Then 2 leading edges were drawn anterolaterally over the lateral aspect of the greater tuberosity, placing the push lock after utilizing all insertion of the push lock with appropriate tension.  Both had resistance to pullout. Next, in similar fashion, I placed a suture anchor right in the biceps groove as this was subluxed medially and portion of the subscap.  I pulled the subscap biceps tendon into the trough after preparing the bed and placed the suture anchor within that bed of the bicipital groove, threaded up through the biceps tendon and the medial portion of the subscap and advanced it posterolaterally after I placed a good surgical knot on that and threaded over the posterolateral aspect of the humerus with a push lock after utilizing all insertion of push lock and appropriate tension.  We then approximated the anterior and posterior leaflets and then we sewed from side-to-side with #1 Vicryl interrupted  figure-of-eight sutures.  I copiously irrigated the wound.  Full coverage was noted.  Repaired the raphe with #1 Vicryl interrupted figure-of-eight sutures, subcu with 2-0 Vicryl simple sutures.  Skin was reapproximated with 4-0 subcuticular Prolene.  Wound reinforced with Steri-Strips.  Sterile dressing applied.  Placed an abduction pillow, extubated without difficulty, and transported to recovery room in satisfactory  condition.  The patient tolerated the procedure well.  No complications.  Assistant was Skip Mayer, [place].  Minimal blood loss.     Jene Every, M.D.     Cordelia Pen  D:  11/21/2011  T:  11/21/2011  Job:  [id number]

## 2011-11-21 NOTE — Anesthesia Postprocedure Evaluation (Signed)
Anesthesia Post Note  Patient: Cheryl Cooper  Procedure(s) Performed: Procedure(s) (LRB): ROTATOR CUFF REPAIR SHOULDER OPEN (Right)  Anesthesia type: General  Patient location: PACU  Post pain: Pain level controlled  Post assessment: Post-op Vital signs reviewed  Last Vitals: BP 133/63  Pulse 78  Temp 36.1 C  Resp 12  SpO2 99%  Post vital signs: Reviewed  Level of consciousness: sedated  Complications: No apparent anesthesia complications

## 2011-11-21 NOTE — Plan of Care (Signed)
Problem: Phase I Progression Outcomes Goal: Sutures/staples intact Outcome: Not Applicable Date Met:  11/21/11 UTA r/t bulky dressing

## 2011-11-21 NOTE — Brief Op Note (Signed)
11/21/2011  9:15 AM  PATIENT:  Cheryl Cooper  71 y.o. female  PRE-OPERATIVE DIAGNOSIS:  Rotator Cuff Tear on the Right  POST-OPERATIVE DIAGNOSIS:  Rotator Cuff Tear on the Right  PROCEDURE:  Procedure(s) (LRB) with comments: ROTATOR CUFF REPAIR SHOULDER OPEN (Right) - with subacromial decompression Biceps tenodesis SURGEON:  Surgeon(s) and Role:    * Javier Docker, MD - Primary  PHYSICIAN ASSISTANT:   ASSISTANTS: Skip Mayer   ANESTHESIA:   general  EBL:  Total I/O In: 1000 [I.V.:1000] Out: -   BLOOD ADMINISTERED:none  DRAINS: none   LOCAL MEDICATIONS USED:  MARCAINE     SPECIMEN:  No Specimen  DISPOSITION OF SPECIMEN:  N/A  COUNTS:  YES  TOURNIQUET:  * No tourniquets in log *  DICTATION: .Other Dictation: Dictation Number [id number]  PLAN OF CARE: Admit to inpatient   PATIENT DISPOSITION:  PACU - hemodynamically stable.   Delay start of Pharmacological VTE agent (>24hrs) due to surgical blood loss or risk of bleeding: no

## 2011-11-21 NOTE — H&P (Signed)
Cheryl Cooper is an 71 y.o. female.   Chief Complaint: right shoulder pain HPI: Tear rotator cuff right.  Past Medical History  Diagnosis Date  . Hypertension   . GERD (gastroesophageal reflux disease)   . Diabetes mellitus     diet controlled  . PONV (postoperative nausea and vomiting) yrs ago, none recent  . Deafness in left ear     Past Surgical History  Procedure Date  . Abdominal hysterectomy 1986  . R foot surgery 1995  . Right hand surgery 2001  . Left rotator cuff 2001  . Left elobow surgery 2003  . Right index finger surgery 2009  . Back surgery 2010    lower    History reviewed. No pertinent family history. Social History:  reports that she has never smoked. She has never used smokeless tobacco. She reports that she does not drink alcohol or use illicit drugs.  Allergies:  Allergies  Allergen Reactions  . Codeine Nausea And Vomiting  . Other Nausea And Vomiting    Clidinium & chlordiazepoxide (Librax)    Medications Prior to Admission  Medication Sig Dispense Refill  . ezetimibe-simvastatin (VYTORIN) 10-80 MG per tablet Take 1 tablet by mouth at bedtime.      . lamoTRIgine (LAMICTAL) 100 MG tablet Take 100 mg by mouth every morning.       [name] levothyroxine (SYNTHROID, LEVOTHROID) 75 MCG tablet Take 75 mcg by mouth daily with breakfast.      . losartan (COZAAR) 25 MG tablet Take 25 mg by mouth daily with breakfast.      . omeprazole (PRILOSEC) 40 MG capsule Take 40 mg by mouth every morning.       [name] L-Methylfolate-B6-B12 (VITACIRC-B) 3-35-2 MG TABS Take 1 tablet by mouth daily.        Results for orders placed during the hospital encounter of 11/21/11 (from the past 48 hour(s))  GLUCOSE, CAPILLARY     Status: Abnormal   Collection Time   11/21/11  6:01 AM      Component Value Range Comment   Glucose-Capillary 104 (*) 70 - 99 mg/dL    No results found.  Review of Systems  Musculoskeletal: Positive for joint pain.  All other systems reviewed and are  negative.    Blood pressure 148/82, pulse 56, temperature 97.8 F (36.6 C), resp. rate 20, SpO2 97.00%. Physical Exam  Vitals reviewed. Constitutional: She appears well-developed.  HENT:  Head: Normocephalic.  Eyes: Pupils are equal, round, and reactive to light.  Neck: Normal range of motion.  Cardiovascular: Normal rate.   Respiratory: Effort normal.  GI: Soft.  Musculoskeletal: She exhibits tenderness.       Weak abduction right. Tender right SA. + impingement. NVI.  Neurological: She is alert.  Skin: Skin is warm and dry.  Psychiatric: She has a normal mood and affect.   MRI retracted tear right RC. XRay type 2 acromion  Assessment/Plan Right RCT retracted. Plan RCR, SAD. Risks and benefits discussed.  Yusef Lamp C 11/21/2011, 7:19 AM

## 2011-11-21 NOTE — Accreditation Note (Signed)
.  dp

## 2011-11-21 NOTE — Transfer of Care (Signed)
Immediate Anesthesia Transfer of Care Note  Patient: Cheryl Cooper  Procedure(s) Performed: Procedure(s) (LRB) with comments: ROTATOR CUFF REPAIR SHOULDER OPEN (Right) - with subacromial decompression  Patient Location: PACU  Anesthesia Type: General  Level of Consciousness: awake, alert  and oriented  Airway & Oxygen Therapy: Patient Spontanous Breathing and Patient connected to face mask oxygen  Post-op Assessment: Report given to PACU RN and Post -op Vital signs reviewed and stable  Post vital signs: Reviewed and stable  Complications: No apparent anesthesia complications

## 2011-11-21 NOTE — Anesthesia Preprocedure Evaluation (Addendum)
Anesthesia Evaluation  Patient identified by MRN, date of birth, ID band Patient awake    Reviewed: Allergy & Precautions, H&P , NPO status , Patient's Chart, lab work & pertinent test results  History of Anesthesia Complications (+) PONV  Airway Mallampati: III TM Distance: >3 FB Neck ROM: Full  Mouth opening: Limited Mouth Opening  Dental  (+) Teeth Intact and Dental Advisory Given   Pulmonary neg pulmonary ROS,  breath sounds clear to auscultation  Pulmonary exam normal       Cardiovascular Exercise Tolerance: Good hypertension, Pt. on medications - Past MI and - CHF Rhythm:Regular Rate:Normal     Neuro/Psych negative neurological ROS  negative psych ROS   GI/Hepatic Neg liver ROS, GERD-  Medicated,  Endo/Other  diabetes, Type 2  Renal/GU negative Renal ROS     Musculoskeletal negative musculoskeletal ROS (+)   Abdominal   Peds  Hematology negative hematology ROS (+)   Anesthesia Other Findings   Reproductive/Obstetrics                          Anesthesia Physical Anesthesia Plan  ASA: III  Anesthesia Plan: General   Post-op Pain Management:    Induction: Intravenous  Airway Management Planned: Oral ETT  Additional Equipment:   Intra-op Plan:   Post-operative Plan: Extubation in OR  Informed Consent: I have reviewed the patients History and Physical, chart, labs and discussed the procedure including the risks, benefits and alternatives for the proposed anesthesia with the patient or authorized representative who has indicated his/her understanding and acceptance.   Dental advisory given  Plan Discussed with: CRNA  Anesthesia Plan Comments:         Anesthesia Quick Evaluation

## 2011-11-22 ENCOUNTER — Encounter (HOSPITAL_COMMUNITY): Payer: Self-pay | Admitting: Specialist

## 2011-11-22 LAB — BASIC METABOLIC PANEL
GFR calc Af Amer: 80 mL/min — ABNORMAL LOW (ref >90)
GFR calc non Af Amer: 69 mL/min — ABNORMAL LOW (ref >90)
Glucose, Bld: 134 mg/dL — ABNORMAL HIGH (ref 70–99)
Potassium: 4.3 mEq/L (ref 3.5–5.1)
Sodium: 136 mEq/L (ref 135–145)

## 2011-11-22 NOTE — Progress Notes (Signed)
Pt discharged home today by Larena Sox, RN. Dressing CDI to R shoulder. Sling to RUE in place. IV d/c'd. Discharge instructions & RX were given to her by Larena Sox, RN. Husband assisted pt with d/c.

## 2011-11-22 NOTE — Progress Notes (Signed)
Discharge instructions given to patient with husband at bedside. Scripts given . Patient verbalized understanding. Patient discharged via wheelchair.

## 2011-11-22 NOTE — Care Management Note (Signed)
    Page 1 of 2   11/22/2011     3:23:24 PM   CARE MANAGEMENT NOTE 11/22/2011  Patient:  Cheryl Cooper, Cheryl Cooper   Account Number:  [id number]  Date Initiated:  11/22/2011  Documentation initiated by:  Colleen Can  Subjective/Objective Assessment:   dx rotator cuff repair     Action/Plan:   Home upon discharge; no DME or HH needs   Anticipated DC Date:  11/22/2011   Anticipated DC Plan:  HOME/SELF CARE  In-house referral  NA      DC Planning Services  CM consult      PAC Choice  NA   Choice offered to / List presented to:  NA   DME arranged  NA      DME agency  NA     HH arranged  NA      HH agency  NA   Status of service:  Completed, signed off Medicare Important Message given?  NA - LOS <3 / Initial given by admissions (If response is "NO", the following Medicare IM given date fields will be blank) Date Medicare IM given:   Date Additional Medicare IM given:    Discharge Disposition:  HOME/SELF CARE  Per UR Regulation:    If discussed at Long Length of Stay Meetings, dates discussed:    Comments:

## 2011-11-22 NOTE — Evaluation (Addendum)
Occupational Therapy Evaluation Patient Details Name: Cheryl Cooper MRN: [id number] DOB: [birth year] Today's Date: 11/22/2011 Time: 1200-1240 and 1038 1047 OT Time Calculation (min): 40 min + 9 = 49  OT Assessment / Plan / Recommendation Clinical Impression  This 71 year old female was admitted for R RCR.  all education was completed and pt.husband verbalize all.  She will follow up with Dr. Shelle Iron for further OT.      OT Assessment  Progress rehab of shoulder as ordered by MD at follow-up appointment    Follow Up Recommendations       Barriers to Discharge      Equipment Recommendations  None recommended by OT    Recommendations for Other Services    Frequency       Precautions / Restrictions Precautions Precautions: Shoulder (sling on except bathing/dressing)   Pertinent Vitals/Pain R shoulder sore, premedicated    ADL  Upper Body Bathing: Performed;Moderate assistance Where Assessed - Upper Body Bathing: Unsupported sitting Upper Body Dressing: Performed;Maximal assistance Where Assessed - Upper Body Dressing: Unsupported sitting Lower Body Dressing: Performed;Minimal assistance Where Assessed - Lower Body Dressing: Unsupported sit to stand Toilet Transfer: Performed;Supervision/safety.  Pt does not have PT needs  Toilet Transfer Method: Sit to stand Transfers/Ambulation Related to ADLs: Reviewed protocol with pt/husband for shoulder.  Pt/husband verbalize education for adls (do not move shoulder, proper position of sling, pillow under arm while in bed, adl modifications).  Husband practiced sling twice.  He donned this with supervision.  Pt also knows sequence.  Did not change dressing as order asked for dressing supplies to room ADL Comments: Pt does need cues not to actively move arm during adls    OT Diagnosis:    OT Problem List:   OT Treatment Interventions:     OT Goals    Visit Information  Last OT Received On: 11/22/11 Assistance Needed: +1    Subjective  Data  Subjective: My husband has to have surgery in a month   Prior Functioning     Home Living Lives With: Spouse Available Help at Discharge: Family Bathroom Shower/Tub: Walk-in Stage manager: Standard Home Adaptive Equipment: Bedside commode/3-in-1 Prior Function Level of Independence: Independent         Vision/Perception     Cognition  Overall Cognitive Status: Appears within functional limits for tasks assessed/performed Arousal/Alertness: Awake/alert Orientation Level: Appears intact for tasks assessed Behavior During Session: [facility] for tasks performed    Extremity/Trunk Assessment Right Upper Extremity Assessment RUE ROM/Strength/Tone: Due to precautions Left Upper Extremity Assessment LUE ROM/Strength/Tone: Within functional levels     Mobility Bed Mobility Bed Mobility: Supine to Sit Supine to Sit: 6: Modified independent (Device/Increase time);HOB elevated Transfers Transfers: Sit to Stand Sit to Stand: 5: Supervision     Shoulder Instructions Donning/doffing sling/immobilizer: Maximal assistance   Exercise     Balance     End of Session    GO Functional Assessment Tool Used: clinical judgment/observation Functional Limitation: Self care Self Care Current Status (Z3086): At least 20 percent but less than 40 percent impaired, limited or restricted Self Care Goal Status (V7846): At least 20 percent but less than 40 percent impaired, limited or restricted Self Care Discharge Status [phone]): At least 20 percent but less than 40 percent impaired, limited or restricted   Duane Trias 11/22/2011, 1:52 PM Marica Otter, OTR/L [phone] 11/22/2011

## 2011-11-22 NOTE — Discharge Summary (Signed)
Patient ID: Cheryl Cooper MRN: [id number] DOB/AGE: Sep 06, 1940 71 y.o.  Admit date: 11/21/2011 Discharge date: 11/22/2011  Admission Diagnoses:  Principal Problem:  *Rotator cuff tear, right   Discharge Diagnoses:  Same  Past Medical History  Diagnosis Date  . Hypertension   . GERD (gastroesophageal reflux disease)   . Diabetes mellitus     diet controlled  . PONV (postoperative nausea and vomiting) yrs ago, none recent  . Deafness in left ear     Surgeries: Procedure(s): ROTATOR CUFF REPAIR SHOULDER OPEN on 11/21/2011   Consultants:    Discharged Condition: Improved  Hospital Course: Cheryl Cooper is an 71 y.o. female who was admitted 11/21/2011 for operative treatment ofRotator cuff tear, right. Patient has severe unremitting pain that affects sleep, daily activities, and work/hobbies. After pre-op clearance the patient was taken to the operating room on 11/21/2011 and underwent  Procedure(s): ROTATOR CUFF REPAIR SHOULDER OPEN.  Tolerated well. No complications.  Patient was given perioperative antibiotics: Anti-infectives     Start     Dose/Rate Route Frequency Ordered Stop   11/21/11 1400   ceFAZolin (ANCEF) IVPB 2 g/50 mL premix        2 g 100 mL/hr over 30 Minutes Intravenous Every 6 hours 11/21/11 1033 11/21/11 1959   11/21/11 0725   polymyxin B 500,000 Units, bacitracin 50,000 Units in sodium chloride irrigation 0.9 % 500 mL irrigation  Status:  Discontinued          As needed 11/21/11 0726 11/21/11 8295           Patient was given sequential compression devices, early ambulation, and chemoprophylaxis to prevent DVT.  Patient benefited maximally from hospital stay and there were no complications.    Recent vital signs: Patient Vitals for the past 24 hrs:  BP Temp Temp src Pulse Resp SpO2  11/22/11 0432 128/72 mmHg 98.2 F (36.8 C) Oral 79  14  94 %  11/22/11 0157 135/77 mmHg 98.4 F (36.9 C) Oral 76  16  91 %  11/21/11 2130 122/74 mmHg 98.5 F (36.9  C) Oral 88  20  90 %  11/21/11 1333 157/82 mmHg 97.5 F (36.4 C) Oral 99  16  95 %     Recent laboratory studies:  Basename 11/22/11 0401  WBC --  HGB --  HCT --  PLT --  NA 136  K 4.3  CL 102  CO2 24  BUN 12  CREATININE 0.84  GLUCOSE 134*  INR --  CALCIUM 9.2     Discharge Medications:     Medication List     As of 11/22/2011 12:47 PM    TAKE these medications         ezetimibe-simvastatin 10-80 MG per tablet   Commonly known as: VYTORIN   Take 1 tablet by mouth at bedtime.      HYDROcodone-acetaminophen 5-325 MG per tablet   Commonly known as: NORCO/VICODIN   Take 1-2 tablets by mouth every 4 (four) hours as needed for pain. MAXIMUM TOTAL ACETAMINOPHEN DOSE IS 4000 MG PER DAY      lamoTRIgine 100 MG tablet   Commonly known as: LAMICTAL   Take 100 mg by mouth every morning.      levothyroxine 75 MCG tablet   Commonly known as: SYNTHROID, LEVOTHROID   Take 75 mcg by mouth daily with breakfast.      losartan 25 MG tablet   Commonly known as: COZAAR   Take 25 mg by mouth daily with  breakfast.      omeprazole 40 MG capsule   Commonly known as: PRILOSEC   Take 40 mg by mouth every morning.      VITACIRC-B 3-35-2 MG Tabs   Take 1 tablet by mouth daily.        Diagnostic Studies: Dg Chest 2 View  11/18/2011  *RADIOLOGY REPORT*  Clinical Data: Preop rotator cuff repair  CHEST - 2 VIEW  Comparison: 12/12/2008  Findings: Heart size is normal.  No pleural effusion or edema.  No airspace consolidation identified.  Chronic bronchitic changes noted.  Review of the visualized osseous structures is unremarkable.  IMPRESSION:  1.  Chronic bronchitic changes. No acute findings.   Original Report Authenticated By: Rosealee Albee, M.D.     Disposition:       Discharge Orders    Future Orders Please Complete By Expires   Diet - low sodium heart healthy      Call MD / Call 911      Comments:   If you experience chest pain or shortness of breath, CALL 911 and be  transported to the hospital emergency room.  If you develope a fever above 101 F, pus (white drainage) or increased drainage or redness at the wound, or calf pain, call your surgeon's office.   Constipation Prevention      Comments:   Drink plenty of fluids.  Prune juice may be helpful.  You may use a stool softener, such as Colace (over the counter) 100 mg twice a day.  Use MiraLax (over the counter) for constipation as needed.   Increase activity slowly as tolerated         Follow-up Information    Follow up with Javier Docker, MD.   Contact information:   [facility] [address] [place] [address] [phone]           Signed: Javier Docker 11/22/2011, 12:47 PM

## 2011-11-22 NOTE — Progress Notes (Signed)
Subjective: 1 Day Post-Op Procedure(s) (LRB): ROTATOR CUFF REPAIR SHOULDER OPEN (Right) Patient reports pain as 2 on 0-10 scale.    Objective: Vital signs in last 24 hours: Temp:  [97.5 F (36.4 C)-98.5 F (36.9 C)] 98.2 F (36.8 C) (10/04 0432) Pulse Rate:  [76-99] 79  (10/04 0432) Resp:  [14-20] 14  (10/04 0432) BP: (122-157)/(72-82) 128/72 mmHg (10/04 0432) SpO2:  [90 %-95 %] 94 % (10/04 0432)  Intake/Output from previous day: 10/03 0701 - 10/04 0700 In: 4505.4 [P.O.:840; I.V.:3615.4; IV Piggyback:50] Out: -  Intake/Output this shift: Total I/O In: 240 [P.O.:240] Out: -   No results found for this basename: HGB:5 in the last 72 hours No results found for this basename: WBC:2,RBC:2,HCT:2,PLT:2 in the last 72 hours  Basename 11/22/11 0401  NA 136  K 4.3  CL 102  CO2 24  BUN 12  CREATININE 0.84  GLUCOSE 134*  CALCIUM 9.2   No results found for this basename: LABPT:2,INR:2 in the last 72 hours  Neurologically intact Neurovascular intact Sensation intact distally Intact pulses distally Dorsiflexion/Plantar flexion intact Incision: dressing C/D/I and no drainage Compartment soft  Assessment/Plan: 1 Day Post-Op Procedure(s) (LRB): ROTATOR CUFF REPAIR SHOULDER OPEN (Right) Advance diet Discharge home with home health  Cheryl Cooper C 11/22/2011, 12:42 PM

## 2011-12-16 ENCOUNTER — Ambulatory Visit: Payer: Medicare Other | Attending: Specialist | Admitting: Physical Therapy

## 2011-12-16 DIAGNOSIS — M25519 Pain in unspecified shoulder: Secondary | ICD-10-CM | POA: Insufficient documentation

## 2011-12-16 DIAGNOSIS — M25619 Stiffness of unspecified shoulder, not elsewhere classified: Secondary | ICD-10-CM | POA: Insufficient documentation

## 2011-12-16 DIAGNOSIS — IMO0001 Reserved for inherently not codable concepts without codable children: Secondary | ICD-10-CM | POA: Insufficient documentation

## 2011-12-19 ENCOUNTER — Ambulatory Visit: Payer: Medicare Other | Admitting: Physical Therapy

## 2011-12-23 ENCOUNTER — Ambulatory Visit: Payer: Medicare Other | Attending: Specialist | Admitting: Physical Therapy

## 2011-12-23 DIAGNOSIS — M25519 Pain in unspecified shoulder: Secondary | ICD-10-CM | POA: Insufficient documentation

## 2011-12-23 DIAGNOSIS — M25619 Stiffness of unspecified shoulder, not elsewhere classified: Secondary | ICD-10-CM | POA: Insufficient documentation

## 2011-12-23 DIAGNOSIS — IMO0001 Reserved for inherently not codable concepts without codable children: Secondary | ICD-10-CM | POA: Insufficient documentation

## 2011-12-26 ENCOUNTER — Ambulatory Visit: Payer: Medicare Other | Admitting: Physical Therapy

## 2011-12-30 ENCOUNTER — Ambulatory Visit: Payer: Medicare Other | Admitting: Physical Therapy

## 2012-01-02 ENCOUNTER — Ambulatory Visit: Payer: Medicare Other | Admitting: Physical Therapy

## 2012-01-06 ENCOUNTER — Ambulatory Visit: Payer: Medicare Other | Admitting: Physical Therapy

## 2012-01-09 ENCOUNTER — Ambulatory Visit: Payer: Medicare Other | Admitting: Physical Therapy

## 2012-01-13 ENCOUNTER — Ambulatory Visit: Payer: Medicare Other | Admitting: Physical Therapy

## 2012-01-15 ENCOUNTER — Ambulatory Visit: Payer: Medicare Other | Admitting: Physical Therapy

## 2012-01-20 ENCOUNTER — Ambulatory Visit: Payer: Medicare Other | Attending: Specialist | Admitting: Physical Therapy

## 2012-01-20 DIAGNOSIS — M25619 Stiffness of unspecified shoulder, not elsewhere classified: Secondary | ICD-10-CM | POA: Insufficient documentation

## 2012-01-20 DIAGNOSIS — M25519 Pain in unspecified shoulder: Secondary | ICD-10-CM | POA: Insufficient documentation

## 2012-01-20 DIAGNOSIS — IMO0001 Reserved for inherently not codable concepts without codable children: Secondary | ICD-10-CM | POA: Insufficient documentation

## 2012-01-24 ENCOUNTER — Ambulatory Visit: Payer: Medicare Other | Admitting: Physical Therapy

## 2012-01-27 ENCOUNTER — Ambulatory Visit: Payer: Medicare Other | Admitting: Physical Therapy

## 2012-01-30 ENCOUNTER — Ambulatory Visit: Payer: Medicare Other | Admitting: Physical Therapy

## 2012-02-04 ENCOUNTER — Ambulatory Visit: Payer: Medicare Other | Admitting: Physical Therapy

## 2012-02-07 ENCOUNTER — Ambulatory Visit: Payer: Medicare Other | Admitting: Physical Therapy

## 2012-02-10 ENCOUNTER — Ambulatory Visit: Payer: Medicare Other | Admitting: Physical Therapy

## 2012-02-13 ENCOUNTER — Ambulatory Visit: Payer: Medicare Other | Admitting: Physical Therapy

## 2012-02-17 ENCOUNTER — Ambulatory Visit: Payer: Medicare Other | Admitting: Physical Therapy

## 2013-02-16 ENCOUNTER — Telehealth: Payer: Self-pay | Admitting: Psychology

## 2013-02-16 NOTE — Telephone Encounter (Signed)
Patient called requesting an appointment.  Not a patient at the [facility].  Referred her to [facility].

## 2014-12-01 ENCOUNTER — Encounter: Payer: Self-pay | Admitting: *Deleted

## 2014-12-02 ENCOUNTER — Ambulatory Visit (INDEPENDENT_AMBULATORY_CARE_PROVIDER_SITE_OTHER): Payer: Medicare Other | Admitting: Diagnostic Neuroimaging

## 2014-12-02 ENCOUNTER — Encounter: Payer: Self-pay | Admitting: Diagnostic Neuroimaging

## 2014-12-02 VITALS — BP 134/76 | HR 57 | Ht 63.0 in | Wt 161.2 lb

## 2014-12-02 DIAGNOSIS — R42 Dizziness and giddiness: Secondary | ICD-10-CM

## 2014-12-02 DIAGNOSIS — R269 Unspecified abnormalities of gait and mobility: Secondary | ICD-10-CM | POA: Diagnosis not present

## 2014-12-02 DIAGNOSIS — M542 Cervicalgia: Secondary | ICD-10-CM

## 2014-12-02 DIAGNOSIS — F0781 Postconcussional syndrome: Secondary | ICD-10-CM

## 2014-12-02 DIAGNOSIS — R292 Abnormal reflex: Secondary | ICD-10-CM | POA: Diagnosis not present

## 2014-12-02 DIAGNOSIS — G44309 Post-traumatic headache, unspecified, not intractable: Secondary | ICD-10-CM

## 2014-12-02 NOTE — Patient Instructions (Signed)
Thank you for coming to see [place] at [facility]. I hope we have been able to provide you high quality care today.  You may receive a patient satisfaction survey over the next few weeks. We would appreciate your feedback and comments so that we may continue to improve ourselves and the health of our patients.  - I will check MRI brain and neck, ultrasound of heart and neck, lab testing - I will setup physical therapy evaluation - caution with balance, walking, driving   ~~~~~~~~~~~~~~~~~~~~~~~~~~~~~~~~~~~~~~~~~~~~~~~~~~~~~~~~~~~~~~~~~  DR. PENUMALLI'S GUIDE TO HAPPY AND HEALTHY LIVING These are some of my general health and wellness recommendations. Some of them may apply to you better than others. Please use common sense as you try these suggestions and feel free to ask me any questions.   ACTIVITY/FITNESS Mental, social, emotional and physical stimulation are very important for brain and body health. Try learning a new activity (arts, music, language, sports, games).  Keep moving your body to the best of your abilities. You can do this at home, inside or outside, the park, community center, gym or anywhere you like. Consider a physical therapist or personal trainer to get started.   NUTRITION Eat more plants: colorful vegetables, nuts, seeds and berries.  Eat less sugar, salt, preservatives and processed foods.  Avoid toxins such as cigarettes and alcohol.  Drink water when you are thirsty. Warm water with a slice of lemon is an excellent morning drink to start the day.  Consider these websites for more information The Nutrition Source (https://www.henry-hernandez.biz/) [employer] Nutrition (WindowBlog.ch)   RELAXATION Consider practicing mindfulness meditation or other relaxation techniques such as deep breathing, prayer, yoga, tai chi, massage. See website mindful.org or the apps Headspace or Calm to help get  started.   SLEEP Try to get at least 7-8+ hours sleep per day. Regular exercise and reduced caffeine will help you sleep better. Practice good sleep hygeine techniques. See website sleep.org for more information.   PLANNING Prepare estate planning, living will, healthcare POA documents. Sometimes this is best planned with the help of an attorney. Theconversationproject.org and agingwithdignity.org are excellent resources.

## 2014-12-02 NOTE — Progress Notes (Signed)
[facility]  PATIENT: Cheryl Cooper DOB: 11/08/1940  REFERRING CLINICIAN: Everly  HISTORY FROM: patient and husband  REASON FOR VISIT: new consult    HISTORICAL  CHIEF COMPLAINT:  Chief Complaint  Patient presents with  . Concussion    rm 7, New Patient, husband- Herschel    HISTORY OF PRESENT ILLNESS:   74 year old right-handed female here for evaluation of postconcussion syndrome.  11/17/2014 patient was visiting family in [place], in the garage putting a broom away. All of a sudden she felt very dizzy and felt like she was losing her balance. She does not remember exactly what happened next. Apparently she staggered backwards, fell down onto the concrete floor and had a severe scalp laceration. Patient tried to stand up from laying position but was unable to sit up. She felt very swimmy headed. Ambulance was called by her family and she is taken to local emergency room. Her blood pressure was noted to be 189/90. She had CT scan of the head which was apparently unremarkable. EKG and blood testing were unremarkable. She had "staples" to her scalp and was discharged from the emergency room.  Since then patient has continued to have dizziness, pressure on the bitemporal region, neck pain, slowness of words, physical movements and balance. No vision changes.  Over the past 6 months patient has had increasing balance and gait difficulties. She fell twice in the last 6 months. She's been having more balance difficulties in general. More close calls and stumbling/stripping on objects.   REVIEW OF SYSTEMS: Full 14 system review of systems performed and notable only fheadache sleepiness dizziness depression anxiety decreased energy.   ALLERGIES: Allergies  Allergen Reactions  . Codeine Nausea And Vomiting  . Other Nausea And Vomiting    Clidinium & chlordiazepoxide (Librax), dizziness    HOME MEDICATIONS: Outpatient Prescriptions Prior to Visit  Medication  Sig Dispense Refill  . ezetimibe-simvastatin (VYTORIN) 10-80 MG per tablet Take 1 tablet by mouth at bedtime.    [name] HYDROcodone-acetaminophen (NORCO) 5-325 MG per tablet Take 1-2 tablets by mouth every 4 (four) hours as needed for pain. MAXIMUM TOTAL ACETAMINOPHEN DOSE IS 4000 MG PER DAY 40 tablet 1  . L-Methylfolate-B6-B12 (VITACIRC-B) 3-35-2 MG TABS Take 1 tablet by mouth daily.    [name] lamoTRIgine (LAMICTAL) 100 MG tablet Take 100 mg by mouth every morning.     [name] levothyroxine (SYNTHROID, LEVOTHROID) 75 MCG tablet Take 75 mcg by mouth daily with breakfast.    . omeprazole (PRILOSEC) 40 MG capsule Take 40 mg by mouth every morning.     [name] losartan (COZAAR) 25 MG tablet Take 25 mg by mouth daily with breakfast.     No facility-administered medications prior to visit.    PAST MEDICAL HISTORY: Past Medical History  Diagnosis Date  . Hypertension   . GERD (gastroesophageal reflux disease)   . Diabetes mellitus     diet controlled  . PONV (postoperative nausea and vomiting) yrs ago, none recent  . Deafness in left ear   . Hypothyroid     PAST SURGICAL HISTORY: Past Surgical History  Procedure Laterality Date  . Abdominal hysterectomy  1986  . R foot surgery  1995  . Right hand surgery  2001  . Left rotator cuff  2001  . Left elobow surgery  2003  . Right index finger surgery  2009  . Back surgery  2010    lower  . Shoulder open rotator cuff repair  11/21/2011    Procedure:  ROTATOR CUFF REPAIR SHOULDER OPEN;  Surgeon: Johnn Hai, MD;  Location: [facility];  Service: Orthopedics;  Laterality: Right;  with subacromial decompression    FAMILY HISTORY: Family History  Problem Relation Age of Onset  . Stroke Father   . Stroke Sister   . Stroke Brother   . Stroke Sister     TIA    SOCIAL HISTORY:  Social History   Social History  . Marital Status: Married    Spouse Name: Herschel  . Number of Children: 3  . Years of Education: 14   Occupational History  .      retired    Social History Main Topics  . Smoking status: Never Smoker   . Smokeless tobacco: Never Used  . Alcohol Use: Yes     Comment: glass wine/day  . Drug Use: No  . Sexual Activity: Not on file   Other Topics Concern  . Not on file   Social History Narrative   Married, lives at home with husband   caffeine - 1 Coke daily     PHYSICAL EXAM  GENERAL EXAM/CONSTITUTIONAL: Vitals:  Filed Vitals:   12/02/14 0821  BP: 120/75  Pulse: 59  Height: 5\' 3"  (1.6 m)  Weight: 161 lb 3.2 oz (73.12 kg)     Body mass index is 28.56 kg/(m^2).  Visual Acuity Screening   Right eye Left eye Both eyes  Without correction:  20/30   With correction:     Comments: 12/02/14 wears contact for reading, unable to see letters well in eye exam    Patient is in no distress; well developed, nourished and groomed; neck is supple  CARDIOVASCULAR:  Examination of carotid arteries is normal; no carotid bruits  Regular rate and rhythm, no murmurs  Examination of peripheral vascular system by observation and palpation is normal  EYES:  Ophthalmoscopic exam of optic discs and posterior segments is normal; no papilledema or hemorrhages  MUSCULOSKELETAL:  Gait, strength, tone, movements noted in Neurologic exam below  NEUROLOGIC: MENTAL STATUS:  No flowsheet data found.  awake, alert, oriented to person, place and time  recent and remote memory intact  normal attention and concentration  language fluent, comprehension intact, naming intact,   fund of knowledge appropriate  CRANIAL NERVE:   2nd - no papilledema on fundoscopic exam  2nd, 3rd, 4th, 6th - pupils equal and reactive to light, visual fields full to confrontation, extraocular muscles intact, no nystagmus  5th - facial sensation symmetric  7th - facial strength symmetric  8th - hearing intact  9th - palate elevates symmetrically, uvula midline  11th - shoulder shrug symmetric  12th - tongue protrusion  midline  MASKED FACIES  MOTOR:   normal bulk and tone, full strength in the BUE, BLE; MILD BRADYKINESIA  SENSORY:   normal and symmetric to light touch, temperature, vibration  COORDINATION:   finger-nose-finger, fine finger movements --> SLOW  REFLEXES:   deep tendon reflexes present and symmetric; BRISK IN LEGS  POSITIVE MYERSONS, SNOUT AND PALMOMENTAL REFLEXES  GAIT/STATION:   narrow based gait; UNSTEADY; DECR ARM SWING; MILD ATAXIA    DIAGNOSTIC DATA (LABS, IMAGING, TESTING) - I reviewed patient records, labs, notes, testing and imaging myself where available.  Lab Results  Component Value Date   WBC 6.3 11/18/2011   HGB 14.6 11/18/2011   HCT 43.5 11/18/2011   MCV 88.2 11/18/2011   PLT 220 11/18/2011      Component Value Date/Time   NA 136 11/22/2011 0401  K 4.3 11/22/2011 0401   CL 102 11/22/2011 0401   CO2 24 11/22/2011 0401   GLUCOSE 134* 11/22/2011 0401   BUN 12 11/22/2011 0401   CREATININE 0.84 11/22/2011 0401   CALCIUM 9.2 11/22/2011 0401   PROT 7.4 12/12/2008 1025   ALBUMIN 3.7 12/12/2008 1025   AST 51* 12/12/2008 1025   ALT 53* 12/12/2008 1025   ALKPHOS 60 12/12/2008 1025   BILITOT 0.6 12/12/2008 1025   GFRNONAA 69* 11/22/2011 0401   GFRAA 80* 11/22/2011 0401   No results found for: CHOL, HDL, LDLCALC, LDLDIRECT, TRIG, CHOLHDL No results found for: HGBA1C No results found for: VITAMINB12 No results found for: TSH   10/09/08 MRI LUMBAR [I reviewed images myself and agree with interpretation. -VRP]  1. Advanced degenerative disc disease at L5-S1 with a broad-based bulging annulus and osteophytic ridging contributing to mild foraminal stenosis bilaterally, left greater than right.  2. Annular rent and central disc protrusion at L4-5. This in combination with hypertrophic facet disease creates mild to moderate right lateral recess stenosis and could impinge on the right L5 nerve root. 3. Broad-based bulging annulus at L3-4 but no  significant spinal or foraminal stenosis.     ASSESSMENT AND PLAN  74 y.o. year old female here with 6 months progressive gait and balance difficulty, with episode of lightheadedness, dizziness, syncope, resulting in head trauma and now postconcussion syndrome. Needs further workup.  The progressive 6 month gait and balance difficulty problem and neurologic exam findings of bradykinesia, gait difficulty, masked facies, decreased arm swing, raise possibility of parkinsonism.  The event on 11/13/2014 of dizziness and passing out, leading to head trauma and concussion, could have been either TIA, syncope, balance difficulty or other cause of dizziness.   Ddx:   Post concussion syndrome - Plan: MR Brain Wo Contrast, MR Cervical Spine Wo Contrast, US Carotid Bilateral, ECHOCARDIOGRAM COMPLETE, Vitamin B12, TSH, Hemoglobin A1c, Ambulatory referral to Physical Therapy, CANCELED: Ambulatory referral to Physical Therapy  Gait difficulty - Plan: MR Brain Wo Contrast, MR Cervical Spine Wo Contrast, US Carotid Bilateral, ECHOCARDIOGRAM COMPLETE, Vitamin B12, TSH, Hemoglobin A1c, Ambulatory referral to Physical Therapy, CANCELED: Ambulatory referral to Physical Therapy  Dizziness and giddiness - Plan: MR Brain Wo Contrast, MR Cervical Spine Wo Contrast, US Carotid Bilateral, ECHOCARDIOGRAM COMPLETE, Vitamin B12, TSH, Hemoglobin A1c, Ambulatory referral to Physical Therapy, CANCELED: Ambulatory referral to Physical Therapy  Neck pain - Plan: MR Brain Wo Contrast, MR Cervical Spine Wo Contrast, US Carotid Bilateral, ECHOCARDIOGRAM COMPLETE, Vitamin B12, TSH, Hemoglobin A1c, Ambulatory referral to Physical Therapy, CANCELED: Ambulatory referral to Physical Therapy  Hyperreflexia - Plan: MR Brain Wo Contrast, MR Cervical Spine Wo Contrast, US Carotid Bilateral, ECHOCARDIOGRAM COMPLETE, Vitamin B12, TSH, Hemoglobin A1c, Ambulatory referral to Physical Therapy, CANCELED: Ambulatory referral to Physical  Therapy  Post-traumatic headache, not intractable, unspecified chronicity pattern - Plan: MR Brain Wo Contrast, MR Cervical Spine Wo Contrast, US Carotid Bilateral, ECHOCARDIOGRAM COMPLETE, Vitamin B12, TSH, Hemoglobin A1c, Ambulatory referral to Physical Therapy, CANCELED: Ambulatory referral to Physical Therapy    PLAN: - additional workup (for TIA, metabolic causes; follow up with PCP for other syncope/cardiac evaluations) - PT evaluation to help improve balance  Orders Placed This Encounter  Procedures  . MR Brain Wo Contrast  . MR Cervical Spine Wo Contrast  . US Carotid Bilateral  . Vitamin B12  . TSH  . Hemoglobin A1c  . Ambulatory referral to Physical Therapy  . ECHOCARDIOGRAM COMPLETE   Return in about 6 weeks (around  01/13/2015).  I reviewed images, labs, notes, records myself. I summarized findings and reviewed with patient, for this high risk condition (TIA, syncope, concussion) requiring high complexity decision making.    Penni Bombard, MD 03/54/6568, 1:27 AM Certified in Neurology, Neurophysiology and Neuroimaging  [facility] [address] [place],  [address] [phone]

## 2014-12-03 LAB — HEMOGLOBIN A1C
ESTIMATED AVERAGE GLUCOSE: 148 mg/dL
Hgb A1c MFr Bld: 6.8 % — ABNORMAL HIGH (ref 4.8–5.6)

## 2014-12-03 LAB — VITAMIN B12: Vitamin B-12: 627 pg/mL (ref 211–946)

## 2014-12-03 LAB — TSH: TSH: 3.37 u[IU]/mL (ref 0.450–4.500)

## 2014-12-05 ENCOUNTER — Telehealth: Payer: Self-pay | Admitting: *Deleted

## 2014-12-05 NOTE — Telephone Encounter (Signed)
Per Dr Leta Baptist, spoke with patient and informed her all labs from last week are normal except Hgb A1C is elevated at 6.8. She states that the last time her PCP checked it, it was 6.6 and her PCP stated "that's okay." She states she will let her know what the result is now. She verbalized understanding, appreciation for call.

## 2014-12-05 NOTE — Telephone Encounter (Signed)
Unable to reach patient by phone; unable to leave message.

## 2014-12-05 NOTE — Telephone Encounter (Signed)
Pt called returning [facility] phone call.

## 2014-12-05 NOTE — Telephone Encounter (Signed)
-----   Message from Penni Bombard, MD sent at 12/05/2014 10:45 AM EDT ----- Regarding: RE: Re: lab results Agree.    ----- Message -----    From: Minna Antis, RN    Sent: 12/05/2014  10:41 AM      To: Penni Bombard, MD Subject: Re: lab results                                Her Hgb A1C is a bit high, 6.8, other labs ok. I'll be glad to call her. Do you want her to FU with PCP re: A1C?  TY, MC

## 2014-12-08 ENCOUNTER — Ambulatory Visit
Admission: RE | Admit: 2014-12-08 | Discharge: 2014-12-08 | Disposition: A | Discharge status: Home / Self Care | Payer: Medicare Other | Source: Ambulatory Visit | Attending: Diagnostic Neuroimaging | Admitting: Diagnostic Neuroimaging

## 2014-12-08 ENCOUNTER — Ambulatory Visit (INDEPENDENT_AMBULATORY_CARE_PROVIDER_SITE_OTHER): Payer: Medicare Other

## 2014-12-08 DIAGNOSIS — R269 Unspecified abnormalities of gait and mobility: Secondary | ICD-10-CM

## 2014-12-08 DIAGNOSIS — M542 Cervicalgia: Secondary | ICD-10-CM

## 2014-12-08 DIAGNOSIS — F0781 Postconcussional syndrome: Secondary | ICD-10-CM

## 2014-12-08 DIAGNOSIS — G44309 Post-traumatic headache, unspecified, not intractable: Secondary | ICD-10-CM

## 2014-12-08 DIAGNOSIS — R42 Dizziness and giddiness: Secondary | ICD-10-CM | POA: Diagnosis not present

## 2014-12-08 DIAGNOSIS — R292 Abnormal reflex: Secondary | ICD-10-CM

## 2014-12-08 NOTE — Progress Notes (Signed)
Spoke with patient on 12/05/14 re: Hgb A1C and discussing with her PCP.

## 2014-12-15 ENCOUNTER — Encounter: Payer: Self-pay | Admitting: Physical Therapy

## 2014-12-15 ENCOUNTER — Ambulatory Visit: Payer: Medicare Other | Attending: Diagnostic Neuroimaging | Admitting: Physical Therapy

## 2014-12-15 DIAGNOSIS — R42 Dizziness and giddiness: Secondary | ICD-10-CM | POA: Diagnosis not present

## 2014-12-15 DIAGNOSIS — R262 Difficulty in walking, not elsewhere classified: Secondary | ICD-10-CM | POA: Diagnosis present

## 2014-12-15 DIAGNOSIS — G44301 Post-traumatic headache, unspecified, intractable: Secondary | ICD-10-CM | POA: Diagnosis present

## 2014-12-15 DIAGNOSIS — M542 Cervicalgia: Secondary | ICD-10-CM | POA: Diagnosis present

## 2014-12-15 DIAGNOSIS — W19XXXA Unspecified fall, initial encounter: Secondary | ICD-10-CM | POA: Insufficient documentation

## 2014-12-15 NOTE — Therapy (Signed)
[address] [place], [place], [address] Phone: [phone]   Fax:  [phone]  Physical Therapy Evaluation  Patient Details  Name: JAYDON SOROKA MRN: [id number] Date of Birth: 1940/07/04 Referring Provider: Leta Baptist  Encounter Date: 12/15/2014      PT End of Session - 12/15/14 1354    Visit Number 1   Date for PT Re-Evaluation 02/14/15   PT Start Time 1314   PT Stop Time 1408   PT Time Calculation (min) 54 min   Activity Tolerance Patient limited by pain   Behavior During Therapy [facility] for tasks assessed/performed      Past Medical History  Diagnosis Date  . Hypertension   . GERD (gastroesophageal reflux disease)   . Diabetes mellitus     diet controlled  . PONV (postoperative nausea and vomiting) yrs ago, none recent  . Deafness in left ear   . Hypothyroid     Past Surgical History  Procedure Laterality Date  . Abdominal hysterectomy  1986  . R foot surgery  1995  . Right hand surgery  2001  . Left rotator cuff  2001  . Left elobow surgery  2003  . Right index finger surgery  2009  . Back surgery  2010    lower  . Shoulder open rotator cuff repair  11/21/2011    Procedure: ROTATOR CUFF REPAIR SHOULDER OPEN;  Surgeon: Johnn Hai, MD;  Location: [facility];  Service: Orthopedics;  Laterality: Right;  with subacromial decompression    There were no vitals filed for this visit.  Visit Diagnosis:  Dizziness and giddiness - Plan: PT plan of care cert/re-cert  Falls, initial encounter - Plan: PT plan of care cert/re-cert  Difficulty walking - Plan: PT plan of care cert/re-cert  Neck pain - Plan: PT plan of care cert/re-cert  Intractable post-traumatic headache, unspecified chronicity pattern - Plan: PT plan of care cert/re-cert      Subjective Assessment - 12/15/14 1320    Subjective Patient reports a fall in the garage on 11/13/14, she reports that she fell backward and hit her  head, she does not remember a reason for the fall.  She reports some headaches, neck pain, dizziness.  She has not had a subsequent fall.   Limitations Walking   Patient Stated Goals have no pain, have no fear of falling   Currently in Pain? Yes   Pain Score 7    Pain Location Head   Pain Orientation Upper   Pain Descriptors / Indicators Aching;Heaviness   Pain Type Acute pain   Pain Onset More than a month ago   Pain Frequency Constant   Aggravating Factors  activity at times will increase the HA pain   Pain Relieving Factors rest   Effect of Pain on Daily Activities difficulty with activity, unable to do housework            [facility] PT Assessment - 12/15/14 0001    Assessment   Medical Diagnosis concussion, falls   Referring Provider Penumalli   Onset Date/Surgical Date 11/13/14   Prior Therapy no   Precautions   Precautions Fall   Balance Screen   Has the patient fallen in the past 6 months Yes   How many times? 2   Has the patient had a decrease in activity level because of a fear of falling?  Yes   Is the patient reluctant to leave their home because of a fear of  falling?  No   Home Environment   Additional Comments has stairs, normally does her own housework, was doing some yardwork   Prior Function   Level of Independence Independent   Leisure water aerobics 2x/week   Observation/Other Assessments   Observations BP is supine 150/80, then when standing 90/60   AROM   Overall AROM Comments Cervical ROM decreased 25% with some neck pain, shoulder and LE ROM WFL's   Strength   Overall Strength Comments LE's 4+/5, shoulders 4-/5 with some neck pain   Palpation   Palpation comment very tender and tight in the cervical parapsinals   Ambulation/Gait   Gait Comments gait is without assistive device, uses walls and furniture for balance, has unsteady gait   Standardized Balance Assessment   Standardized Balance Assessment Timed Up and Go Test;Berg Balance Test   Berg  Balance Test   Sit to Stand Able to stand without using hands and stabilize independently   Standing Unsupported Able to stand safely 2 minutes   Sitting with Back Unsupported but Feet Supported on Floor or Stool Able to sit safely and securely 2 minutes   Stand to Sit Sits safely with minimal use of hands   Transfers Able to transfer safely, definite need of hands   Standing Unsupported with Eyes Closed Able to stand 10 seconds with supervision   Standing Ubsupported with Feet Together Able to place feet together independently and stand for 1 minute with supervision   From Standing, Reach Forward with Outstretched Arm Can reach forward >12 cm safely (5")   From Standing Position, Pick up Object from Floor Able to pick up shoe, needs supervision   From Standing Position, Turn to Look Behind Over each Shoulder Looks behind one side only/other side shows less weight shift   Turn 360 Degrees Able to turn 360 degrees safely one side only in 4 seconds or less   Standing Unsupported, Alternately Place Feet on Step/Stool Able to complete 4 steps without aid or supervision   Standing Unsupported, One Foot in Front Able to take small step independently and hold 30 seconds   Standing on One Leg Tries to lift leg/unable to hold 3 seconds but remains standing independently   Total Score 42   Timed Up and Go Test   Normal TUG (seconds) 15  unsteady with turn                   [facility] Adult PT Treatment/Exercise - 12/15/14 0001    Modalities   Modalities Electrical Stimulation   Electrical Stimulation   Electrical Stimulation Location to cervical area   Electrical Stimulation Action IFC   Electrical Stimulation Parameters tolerance   Electrical Stimulation Goals Pain                  PT Short Term Goals - 12/15/14 1437    PT SHORT TERM GOAL #1   Title independent with initial HEP   Time 2   Period Weeks   Status New           PT Long Term Goals - 12/15/14 1437    PT  LONG TERM GOAL #1   Title understand fall risks and orthostatic hypotension   Time 8   Period Weeks   Status New   PT LONG TERM GOAL #2   Title decrease HA severity 50%   Time 8   Period Weeks   Status New   PT LONG TERM GOAL #3   Title decrease  HA frequency 50%   Time 8   Period Weeks   Status New   PT LONG TERM GOAL #4   Title increase Berg balance test score to 47/56   Time 8   Period Weeks   Status New               Plan - 12/22/2014 1355    Clinical Impression Statement Patient reports a fall backwards on 11/13/14.  She sustained a concussion, has suffered from neck pain and HA's since that time.  She has a fear of falls now and reports that she feels unsteady.  Her blood pressure did drop from 15/80 while supine to 90/60 when standing.   Pt will benefit from skilled therapeutic intervention in order to improve on the following deficits Abnormal gait;Decreased balance;Decreased mobility;Decreased range of motion;Decreased strength;Dizziness;Difficulty walking;Increased muscle spasms;Postural dysfunction;Pain   Rehab Potential Good   PT Frequency 2x / week   PT Duration 8 weeks   PT Treatment/Interventions Moist Heat;Electrical Stimulation;Cryotherapy;Ultrasound;Therapeutic activities;Therapeutic exercise;Manual techniques;Patient/family education;Balance training   PT Next Visit Plan Slowly add balance and exercises, treat spasms and pain with modalities   Consulted and Agree with Plan of Care Patient          G-Codes - 12-22-2014 1440    Functional Assessment Tool Used foto   Functional Limitation Carrying, moving and handling objects   Carrying, Moving and Handling Objects Current Status (A2505) At least 60 percent but less than 80 percent impaired, limited or restricted   Carrying, Moving and Handling Objects Goal Status (L9767) At least 40 percent but less than 60 percent impaired, limited or restricted       Problem List Patient Active Problem List    Diagnosis Date Noted  . Rotator cuff tear, right 11/21/2011    Sumner Boast., PT 12/22/14, 2:42 PM  [address] [place], [place], [address] Phone: [phone]   Fax:  [phone]  Name: MITCHELLE SULTAN MRN: [id number] Date of Birth: June 03, 1940

## 2014-12-16 ENCOUNTER — Ambulatory Visit (HOSPITAL_COMMUNITY): Payer: Medicare Other | Attending: Cardiology

## 2014-12-16 ENCOUNTER — Encounter: Payer: Self-pay | Admitting: Diagnostic Neuroimaging

## 2014-12-16 ENCOUNTER — Telehealth: Payer: Self-pay | Admitting: *Deleted

## 2014-12-16 ENCOUNTER — Other Ambulatory Visit: Payer: Self-pay

## 2014-12-16 DIAGNOSIS — R42 Dizziness and giddiness: Secondary | ICD-10-CM

## 2014-12-16 DIAGNOSIS — I5189 Other ill-defined heart diseases: Secondary | ICD-10-CM | POA: Diagnosis not present

## 2014-12-16 DIAGNOSIS — R292 Abnormal reflex: Secondary | ICD-10-CM

## 2014-12-16 DIAGNOSIS — I10 Essential (primary) hypertension: Secondary | ICD-10-CM

## 2014-12-16 DIAGNOSIS — E119 Type 2 diabetes mellitus without complications: Secondary | ICD-10-CM | POA: Insufficient documentation

## 2014-12-16 DIAGNOSIS — F0781 Postconcussional syndrome: Secondary | ICD-10-CM | POA: Diagnosis not present

## 2014-12-16 DIAGNOSIS — I517 Cardiomegaly: Secondary | ICD-10-CM | POA: Diagnosis not present

## 2014-12-16 DIAGNOSIS — G44309 Post-traumatic headache, unspecified, not intractable: Secondary | ICD-10-CM

## 2014-12-16 DIAGNOSIS — I700 Atherosclerosis of aorta: Secondary | ICD-10-CM | POA: Insufficient documentation

## 2014-12-16 DIAGNOSIS — R269 Unspecified abnormalities of gait and mobility: Secondary | ICD-10-CM | POA: Diagnosis not present

## 2014-12-16 DIAGNOSIS — M542 Cervicalgia: Secondary | ICD-10-CM

## 2014-12-16 DIAGNOSIS — R55 Syncope and collapse: Secondary | ICD-10-CM | POA: Insufficient documentation

## 2014-12-16 NOTE — Telephone Encounter (Signed)
Error, patient was called re: lab results

## 2014-12-21 ENCOUNTER — Encounter: Payer: Self-pay | Admitting: Physical Therapy

## 2014-12-21 ENCOUNTER — Ambulatory Visit: Payer: Medicare Other | Attending: Diagnostic Neuroimaging | Admitting: Physical Therapy

## 2014-12-21 DIAGNOSIS — R262 Difficulty in walking, not elsewhere classified: Secondary | ICD-10-CM | POA: Diagnosis present

## 2014-12-21 DIAGNOSIS — G44301 Post-traumatic headache, unspecified, intractable: Secondary | ICD-10-CM | POA: Insufficient documentation

## 2014-12-21 DIAGNOSIS — W19XXXA Unspecified fall, initial encounter: Secondary | ICD-10-CM | POA: Insufficient documentation

## 2014-12-21 DIAGNOSIS — R42 Dizziness and giddiness: Secondary | ICD-10-CM | POA: Diagnosis not present

## 2014-12-21 DIAGNOSIS — M542 Cervicalgia: Secondary | ICD-10-CM | POA: Diagnosis present

## 2014-12-21 NOTE — Therapy (Signed)
[address] [place], [place], [address] Phone: [phone]   Fax:  [phone]  Physical Therapy Treatment  Patient Details  Name: Cheryl Cooper MRN: [id number] Date of Birth: 02-03-1941 Referring Provider: Leta Baptist  Encounter Date: 12/21/2014      PT End of Session - 12/21/14 1603    Visit Number 2   PT Start Time 1761   PT Stop Time 6073   PT Time Calculation (min) 58 min      Past Medical History  Diagnosis Date  . Hypertension   . GERD (gastroesophageal reflux disease)   . Diabetes mellitus     diet controlled  . PONV (postoperative nausea and vomiting) yrs ago, none recent  . Deafness in left ear   . Hypothyroid     Past Surgical History  Procedure Laterality Date  . Abdominal hysterectomy  1986  . R foot surgery  1995  . Right hand surgery  2001  . Left rotator cuff  2001  . Left elobow surgery  2003  . Right index finger surgery  2009  . Back surgery  2010    lower  . Shoulder open rotator cuff repair  11/21/2011    Procedure: ROTATOR CUFF REPAIR SHOULDER OPEN;  Surgeon: Johnn Hai, MD;  Location: [facility];  Service: Orthopedics;  Laterality: Right;  with subacromial decompression    There were no vitals filed for this visit.  Visit Diagnosis:  Dizziness and giddiness  Falls, initial encounter  Difficulty walking  Neck pain  Intractable post-traumatic headache, unspecified chronicity pattern      Subjective Assessment - 12/21/14 1515    Subjective Pt reports she is still dealing with frequent dizziness, and LOB with daily activities. Reports that her neck pain is decreasing some and really only has pain when turning her head. Is still getting about one headache a day, feels like they are brought on by neck pain. Pt reports she is most dizzy when rizing or lowering onto the bed.    Currently in Pain? No/denies                         [facility] Adult PT  Treatment/Exercise - 12/21/14 0001    Exercises   Exercises Neck;Knee/Hip;Shoulder   Knee/Hip Exercises: Machines for Strengthening   Cybex Knee Extension 10# 2x10   Cybex Knee Flexion 20# 2x10   Knee/Hip Exercises: Seated   Sit to Sand 2 sets;10 reps   Shoulder Exercises: Standing   Theraband Level (Shoulder Flexion) Level 2 (Red)  Rows, Extensions 2 sets of 10    Modalities   Modalities Retail buyer Location to cervical area   Electrical Stimulation Action IFC   Electrical Stimulation Parameters tolerance    Electrical Stimulation Goals Pain         Vestibular Treatment/Exercise - 12/21/14 0001    Vestibular Treatment/Exercise   Vestibular Treatment Provided Canalith Repositioning;Habituation   Canalith Repositioning Epley Manuever Left   Habituation Exercises Brandt Daroff    EPLEY MANUEVER LEFT   Number of Reps  2   Overall Response  Improved Symptoms   Nestor Lewandowsky   Number of Reps  2                 PT Short Term Goals - 12/15/14 1437    PT SHORT TERM GOAL #1   Title independent with initial HEP  Time 2   Period Weeks   Status New           PT Long Term Goals - 12/15/14 1437    PT LONG TERM GOAL #1   Title understand fall risks and orthostatic hypotension   Time 8   Period Weeks   Status New   PT LONG TERM GOAL #2   Title decrease HA severity 50%   Time 8   Period Weeks   Status New   PT LONG TERM GOAL #3   Title decrease HA frequency 50%   Time 8   Period Weeks   Status New   PT LONG TERM GOAL #4   Title increase Berg balance test score to 47/56   Time 8   Period Weeks   Status New               Plan - 12/21/14 1655    Clinical Impression Statement Patient notified us about when she turns her head she gets dizzy.  I performed the Epley manuever and had a positive test on the left.  Doing Brandt-Daroff ex's seemed to help some.   PT Next Visit Plan Slowly add  balance and exercises, treat spasms and pain with modalities   Consulted and Agree with Plan of Care Patient        Problem List Patient Active Problem List   Diagnosis Date Noted  . Rotator cuff tear, right 11/21/2011    Sumner Boast., PT 12/21/2014, 4:58 PM  [address] [place], [place], [address] Phone: [phone]   Fax:  [phone]  Name: Cheryl Cooper MRN: [id number] Date of Birth: March 13, 1940

## 2014-12-23 ENCOUNTER — Encounter: Payer: Self-pay | Admitting: Physical Therapy

## 2014-12-23 ENCOUNTER — Ambulatory Visit: Payer: Medicare Other | Admitting: Physical Therapy

## 2014-12-23 DIAGNOSIS — G44301 Post-traumatic headache, unspecified, intractable: Secondary | ICD-10-CM

## 2014-12-23 DIAGNOSIS — R42 Dizziness and giddiness: Secondary | ICD-10-CM

## 2014-12-23 DIAGNOSIS — M542 Cervicalgia: Secondary | ICD-10-CM

## 2014-12-23 DIAGNOSIS — W19XXXA Unspecified fall, initial encounter: Secondary | ICD-10-CM

## 2014-12-23 DIAGNOSIS — R262 Difficulty in walking, not elsewhere classified: Secondary | ICD-10-CM

## 2014-12-23 NOTE — Therapy (Signed)
[address] [place], [place], [address] Phone: [phone]   Fax:  [phone]  Physical Therapy Treatment  Patient Details  Name: Cheryl Cooper MRN: [id number] Date of Birth: [birth year] Referring Provider: Leta Baptist  Encounter Date: 12/23/2014      PT End of Session - 12/23/14 0925    Visit Number 3   Date for PT Re-Evaluation 02/14/15   PT Start Time 0846   PT Stop Time 0940   PT Time Calculation (min) 54 min      Past Medical History  Diagnosis Date  . Hypertension   . GERD (gastroesophageal reflux disease)   . Diabetes mellitus     diet controlled  . PONV (postoperative nausea and vomiting) yrs ago, none recent  . Deafness in left ear   . Hypothyroid     Past Surgical History  Procedure Laterality Date  . Abdominal hysterectomy  1986  . R foot surgery  1995  . Right hand surgery  2001  . Left rotator cuff  2001  . Left elobow surgery  2003  . Right index finger surgery  2009  . Back surgery  2010    lower  . Shoulder open rotator cuff repair  11/21/2011    Procedure: ROTATOR CUFF REPAIR SHOULDER OPEN;  Surgeon: Johnn Hai, MD;  Location: [facility];  Service: Orthopedics;  Laterality: Right;  with subacromial decompression    There were no vitals filed for this visit.  Visit Diagnosis:  Falls, initial encounter  Difficulty walking  Neck pain  Intractable post-traumatic headache, unspecified chronicity pattern  Dizziness and giddiness      Subjective Assessment - 12/23/14 0847    Subjective Pt reports that she has a rough morning, "I couldn't get my balance". Reports that shew is doing all right not swimmy headed.    Currently in Pain? Yes   Pain Score 7    Pain Location Arm   Pain Orientation Left   Pain Descriptors / Indicators Aching                         [facility] Adult PT Treatment/Exercise - 12/23/14 0001    High Level Balance   High Level  Balance Activities Side stepping;Backward walking   High Level Balance Comments LOB x 2 with backwards walking.    Neck Exercises: Standing   Other Standing Exercises neck rotation AROM x10   Knee/Hip Exercises: Aerobic   Nustep L4 x7 min    Knee/Hip Exercises: Machines for Strengthening   Cybex Knee Extension 15# 2x10   Cybex Knee Flexion 25# 2x10   Knee/Hip Exercises: Seated   Abduction/Adduction  2 sets;15 reps   Abd/Adduction Limitations black Tband    Sit to Sand 2 sets;10 reps  yellow weighted ball   Shoulder Exercises: Seated   Row 10 reps;Weights  2 sets    Row Weight (lbs) 20   Shoulder Exercises: Standing   Extension 15 reps;Theraband  2 sets    Theraband Level (Shoulder Extension) Level 2 (Red)   Modalities   Modalities Electrical Stimulation;Moist Heat   Moist Heat Therapy   Number Minutes Moist Heat 15 Minutes   Moist Heat Location Cervical   Electrical Stimulation   Electrical Stimulation Location to cervical area   Electrical Stimulation Action IFC   Electrical Stimulation Parameters tolerance    Electrical Stimulation Goals Pain  PT Short Term Goals - 12/23/14 0928    PT SHORT TERM GOAL #1   Title independent with initial HEP   Status Partially Met           PT Long Term Goals - 12/15/14 1437    PT LONG TERM GOAL #1   Title understand fall risks and orthostatic hypotension   Time 8   Period Weeks   Status New   PT LONG TERM GOAL #2   Title decrease HA severity 50%   Time 8   Period Weeks   Status New   PT LONG TERM GOAL #3   Title decrease HA frequency 50%   Time 8   Period Weeks   Status New   PT LONG TERM GOAL #4   Title increase Berg balance test score to 47/56   Time 8   Period Weeks   Status New               Plan - 12/23/14 1504    Clinical Impression Statement Reports that she had a rough morning. Instructed not to perform vestibular HEP exercises this weekend. Tolerated additional  therapeutic exercises this date with increased resistance. Reports pain when turning her head to the right.   Pt will benefit from skilled therapeutic intervention in order to improve on the following deficits Abnormal gait;Decreased balance;Decreased mobility;Decreased range of motion;Decreased strength;Dizziness;Difficulty walking;Increased muscle spasms;Postural dysfunction;Pain   Rehab Potential Good   PT Frequency 2x / week   PT Duration 8 weeks   PT Treatment/Interventions Moist Heat;Electrical Stimulation;Cryotherapy;Ultrasound;Therapeutic activities;Therapeutic exercise;Manual techniques;Patient/family education;Balance training   PT Next Visit Plan MT to cervical spine        Problem List Patient Active Problem List   Diagnosis Date Noted  . Rotator cuff tear, right 11/21/2011    Scot Jun, PTA  12/23/2014, 9:28 AM  [address] [place], [place], [address] Phone: [phone]   Fax:  [phone]  Name: HILDE CHURCHMAN MRN: [id number] Date of Birth: 1940/03/24

## 2014-12-27 ENCOUNTER — Encounter: Payer: Self-pay | Admitting: Diagnostic Neuroimaging

## 2014-12-27 ENCOUNTER — Encounter: Payer: Self-pay | Admitting: Physical Therapy

## 2014-12-27 ENCOUNTER — Ambulatory Visit: Payer: Medicare Other | Admitting: Physical Therapy

## 2014-12-27 DIAGNOSIS — R42 Dizziness and giddiness: Secondary | ICD-10-CM | POA: Diagnosis not present

## 2014-12-27 DIAGNOSIS — G44301 Post-traumatic headache, unspecified, intractable: Secondary | ICD-10-CM

## 2014-12-27 DIAGNOSIS — R262 Difficulty in walking, not elsewhere classified: Secondary | ICD-10-CM

## 2014-12-27 DIAGNOSIS — M542 Cervicalgia: Secondary | ICD-10-CM

## 2014-12-27 DIAGNOSIS — W19XXXA Unspecified fall, initial encounter: Secondary | ICD-10-CM

## 2014-12-27 NOTE — Therapy (Signed)
[address] [place], [place], [address] Phone: [phone]   Fax:  [phone]  Physical Therapy Treatment  Patient Details  Name: Cheryl Cooper MRN: [id number] Date of Birth: Dec 18, 1940 Referring Provider: Leta Baptist  Encounter Date: 12/27/2014      PT End of Session - 12/27/14 1426    Visit Number 4   Date for PT Re-Evaluation 02/14/15   PT Start Time 8413   PT Stop Time 1427   PT Time Calculation (min) 42 min   Activity Tolerance Patient limited by pain;Patient tolerated treatment well   Behavior During Therapy [facility] for tasks assessed/performed      Past Medical History  Diagnosis Date  . Hypertension   . GERD (gastroesophageal reflux disease)   . Diabetes mellitus     diet controlled  . PONV (postoperative nausea and vomiting) yrs ago, none recent  . Deafness in left ear   . Hypothyroid     Past Surgical History  Procedure Laterality Date  . Abdominal hysterectomy  1986  . R foot surgery  1995  . Right hand surgery  2001  . Left rotator cuff  2001  . Left elobow surgery  2003  . Right index finger surgery  2009  . Back surgery  2010    lower  . Shoulder open rotator cuff repair  11/21/2011    Procedure: ROTATOR CUFF REPAIR SHOULDER OPEN;  Surgeon: Johnn Hai, MD;  Location: [facility];  Service: Orthopedics;  Laterality: Right;  with subacromial decompression    There were no vitals filed for this visit.  Visit Diagnosis:  Difficulty walking  Neck pain  Falls, initial encounter  Dizziness and giddiness  Intractable post-traumatic headache, unspecified chronicity pattern      Subjective Assessment - 12/27/14 1347    Subjective Pt reports that she is doing good, still has some balance issues. Pt reports no spells of dizziness. Still wobbly when she sit ups from laying in bed   Currently in Pain? No/denies   Pain Score 0-No pain            [facility] PT Assessment - 12/27/14  0001    Timed Up and Go Test   TUG Normal TUG   Normal TUG (seconds) 7                     [facility] Adult PT Treatment/Exercise - 12/27/14 0001    High Level Balance   High Level Balance Activities Side stepping;Backward walking   High Level Balance Comments LOB x 1 with backwards walking.    Neck Exercises: Standing   Other Standing Exercises neck rotation AROM x10   Neck Exercises: Seated   Other Seated Exercise cervical retractions x10    Knee/Hip Exercises: Aerobic   Nustep L4 x7 min    Knee/Hip Exercises: Machines for Strengthening   Cybex Knee Extension 15# 2x15   Cybex Knee Flexion 25# 2x15   Knee/Hip Exercises: Standing   Other Standing Knee Exercises Standing march on airex 2x10  Pt tends to leans back with airex marching causing LOB    Knee/Hip Exercises: Seated   Abduction/Adduction  2 sets;15 reps   Abd/Adduction Limitations black Tband    Sit to Sand 2 sets;10 reps  yellow weighted ball    Shoulder Exercises: Seated   Row Weights;15 reps  2 sets   Row Weight (lbs) 20   Other Seated Exercises seated trunt rotations with yellow ball  x10                   PT Short Term Goals - 12/23/14 0928    PT SHORT TERM GOAL #1   Title independent with initial HEP   Status Partially Met           PT Long Term Goals - 12/15/14 1437    PT LONG TERM GOAL #1   Title understand fall risks and orthostatic hypotension   Time 8   Period Weeks   Status New   PT LONG TERM GOAL #2   Title decrease HA severity 50%   Time 8   Period Weeks   Status New   PT LONG TERM GOAL #3   Title decrease HA frequency 50%   Time 8   Period Weeks   Status New   PT LONG TERM GOAL #4   Title increase Berg balance test score to 47/56   Time 8   Period Weeks   Status New               Plan - 12/27/14 1427    Clinical Impression Statement Pt continues to perform therapeutic exercises well. Does report improvement overall no pain just balance issues.  Appointment with neurological MD next week Pt does decrease TUG score to 7 seconds.   Pt will benefit from skilled therapeutic intervention in order to improve on the following deficits Abnormal gait;Decreased balance;Decreased mobility;Decreased range of motion;Decreased strength;Dizziness;Difficulty walking;Increased muscle spasms;Postural dysfunction;Pain   Rehab Potential Good   PT Frequency 2x / week   PT Duration 8 weeks   PT Treatment/Interventions Moist Heat;Electrical Stimulation;Cryotherapy;Ultrasound;Therapeutic activities;Therapeutic exercise;Manual techniques;Patient/family education;Balance training   PT Next Visit Plan balance interventions        Problem List Patient Active Problem List   Diagnosis Date Noted  . Rotator cuff tear, right 11/21/2011    Scot Jun, PTA  12/27/2014, 2:30 PM  [address] [place], [place], [address] Phone: [phone]   Fax:  [phone]  Name: Cheryl Cooper MRN: [id number] Date of Birth: March 29, 1940

## 2014-12-28 ENCOUNTER — Telehealth: Payer: Self-pay | Admitting: *Deleted

## 2014-12-28 NOTE — Telephone Encounter (Signed)
Spoke with patient re: My Chart message. Informed her per Dr Tish Frederickson she may return to driving when she feels well enough to drive safely. Also rescheduled her for earlier FU at her request, will ocme in next week. She understands to arrive 15 min early. She verbalized understanding and appreciation for call.

## 2014-12-29 ENCOUNTER — Ambulatory Visit: Payer: Medicare Other | Admitting: Physical Therapy

## 2014-12-29 ENCOUNTER — Encounter: Payer: Self-pay | Admitting: Physical Therapy

## 2014-12-29 DIAGNOSIS — R262 Difficulty in walking, not elsewhere classified: Secondary | ICD-10-CM

## 2014-12-29 DIAGNOSIS — R42 Dizziness and giddiness: Secondary | ICD-10-CM | POA: Diagnosis not present

## 2014-12-29 DIAGNOSIS — W19XXXA Unspecified fall, initial encounter: Secondary | ICD-10-CM

## 2014-12-29 NOTE — Therapy (Signed)
[address] [place], [place], [address] Phone: [phone]   Fax:  [phone]  Physical Therapy Treatment  Patient Details  Name: Cheryl Cooper MRN: [id number] Date of Birth: August 09, 1940 Referring Provider: Leta Baptist  Encounter Date: 12/29/2014      PT End of Session - 12/29/14 1446    Visit Number 5   Date for PT Re-Evaluation 02/14/15   PT Start Time [id number]   PT Stop Time 1446   PT Time Calculation (min) 41 min   Activity Tolerance Patient tolerated treatment well   Behavior During Therapy [facility] for tasks assessed/performed      Past Medical History  Diagnosis Date  . Hypertension   . GERD (gastroesophageal reflux disease)   . Diabetes mellitus     diet controlled  . PONV (postoperative nausea and vomiting) yrs ago, none recent  . Deafness in left ear   . Hypothyroid     Past Surgical History  Procedure Laterality Date  . Abdominal hysterectomy  1986  . R foot surgery  1995  . Right hand surgery  2001  . Left rotator cuff  2001  . Left elobow surgery  2003  . Right index finger surgery  2009  . Back surgery  2010    lower  . Shoulder open rotator cuff repair  11/21/2011    Procedure: ROTATOR CUFF REPAIR SHOULDER OPEN;  Surgeon: Johnn Hai, MD;  Location: [facility];  Service: Orthopedics;  Laterality: Right;  with subacromial decompression    There were no vitals filed for this visit.  Visit Diagnosis:  Difficulty walking  Falls, initial encounter  Dizziness and giddiness      Subjective Assessment - 12/29/14 1420    Subjective Reports that biggest issue now is her balance.  Feels like she is having less dizziness and les neck pain.   Currently in Pain? No/denies                         [facility] Adult PT Treatment/Exercise - 12/29/14 0001    Ambulation/Gait   Gait Comments gait oustide negotiating curbs, uneven surfaces and slopes   High Level Balance   High  Level Balance Activities Side stepping;Backward walking;Tandem walking;Marching forwards   High Level Balance Comments on airex ball toss, kicks   Knee/Hip Exercises: Aerobic   Nustep L5 x7 min    Knee/Hip Exercises: Machines for Strengthening   Cybex Knee Extension 15# 2x15   Cybex Knee Flexion 25# 2x15                  PT Short Term Goals - 12/29/14 1450    PT SHORT TERM GOAL #1   Title independent with initial HEP   Status Achieved           PT Long Term Goals - 12/29/14 1450    PT LONG TERM GOAL #1   Title understand fall risks and orthostatic hypotension   Status Achieved               Plan - 12/29/14 1446    Clinical Impression Statement Patient with biggest issue being balance wiht difficulty with tandem walk and sudden turns.  She reports that she will be seeing MD next Wednesday.   PT Next Visit Plan write MD note next visit, she is now driving   Consulted and Agree with Plan of Care Patient  Problem List Patient Active Problem List   Diagnosis Date Noted  . Rotator cuff tear, right 11/21/2011    Sumner Boast., PT 12/29/2014, 2:51 PM  [address] [place], [place], [address] Phone: [phone]   Fax:  [phone]  Name: Cheryl Cooper MRN: [id number] Date of Birth: [birth year]

## 2015-01-03 ENCOUNTER — Encounter: Payer: Self-pay | Admitting: Physical Therapy

## 2015-01-03 ENCOUNTER — Ambulatory Visit: Payer: Medicare Other | Admitting: Physical Therapy

## 2015-01-03 DIAGNOSIS — M542 Cervicalgia: Secondary | ICD-10-CM

## 2015-01-03 DIAGNOSIS — W19XXXA Unspecified fall, initial encounter: Secondary | ICD-10-CM

## 2015-01-03 DIAGNOSIS — R262 Difficulty in walking, not elsewhere classified: Secondary | ICD-10-CM

## 2015-01-03 DIAGNOSIS — R42 Dizziness and giddiness: Secondary | ICD-10-CM

## 2015-01-03 NOTE — Therapy (Addendum)
[address] [place], [place], [address] Phone: [phone]   Fax:  [phone]  Physical Therapy Treatment  Patient Details  Name: Cheryl Cooper MRN: [id number] Date of Birth: 12/03/1940 Referring Provider: Leta Baptist  Encounter Date: 01/03/2015      PT End of Session - 01/03/15 1511    Visit Number 6   Date for PT Re-Evaluation 02/14/15   PT Start Time 1430   PT Stop Time 1511   PT Time Calculation (min) 41 min   Activity Tolerance Patient tolerated treatment well   Behavior During Therapy [facility] for tasks assessed/performed      Past Medical History  Diagnosis Date  . Hypertension   . GERD (gastroesophageal reflux disease)   . Diabetes mellitus     diet controlled  . PONV (postoperative nausea and vomiting) yrs ago, none recent  . Deafness in left ear   . Hypothyroid     Past Surgical History  Procedure Laterality Date  . Abdominal hysterectomy  1986  . R foot surgery  1995  . Right hand surgery  2001  . Left rotator cuff  2001  . Left elobow surgery  2003  . Right index finger surgery  2009  . Back surgery  2010    lower  . Shoulder open rotator cuff repair  11/21/2011    Procedure: ROTATOR CUFF REPAIR SHOULDER OPEN;  Surgeon: Johnn Hai, MD;  Location: [facility];  Service: Orthopedics;  Laterality: Right;  with subacromial decompression    There were no vitals filed for this visit.  Visit Diagnosis:  Neck pain  Falls, initial encounter  Difficulty walking  Dizziness and giddiness                       [facility] Adult PT Treatment/Exercise - 01/03/15 0001    Ambulation/Gait   Gait Comments gait outside negotiating curbs, uneven surfaces and slopes; Negotiated  one flight of stairs alternating pattern with one rail   High Level Balance   High Level Balance Activities Side stepping;Backward walking;Tandem walking;Marching forwards   High Level Balance Comments  Standing march on airex 2 x10  Pt tends to lean back   Neck Exercises: Seated   Other Seated Exercise Seated rows #25 2x15    Other Seated Exercise Lat pull down #20 2x10   Knee/Hip Exercises: Aerobic   Nustep L5 x7 min    Knee/Hip Exercises: Machines for Strengthening   Cybex Knee Extension 20# 2x10   Cybex Knee Flexion 25# 2x15   Knee/Hip Exercises: Seated   Sit to Sand 2 sets;10 reps;without UE support  2 sets                   PT Short Term Goals - 12/29/14 1450    PT SHORT TERM GOAL #1   Title independent with initial HEP   Status Achieved           PT Long Term Goals - 12/29/14 1450    PT LONG TERM GOAL #1   Title understand fall risks and orthostatic hypotension   Status Achieved               Plan - 01/03/15 1512    Clinical Impression Statement Reports seeing the MD tomorrow. Still reports issues with balance in the morning. Able to ambulate outside on uneven surfaces but has difficulty with interventions on the airex pad.   Pt will benefit  from skilled therapeutic intervention in order to improve on the following deficits Abnormal gait;Decreased balance;Decreased mobility;Decreased range of motion;Decreased strength;Dizziness;Difficulty walking;Increased muscle spasms;Postural dysfunction;Pain   Rehab Potential Good   PT Frequency 2x / week   PT Duration 8 weeks   PT Treatment/Interventions Moist Heat;Electrical Stimulation;Cryotherapy;Ultrasound;Therapeutic activities;Therapeutic exercise;Manual techniques;Patient/family education;Balance training   PT Next Visit Plan get report from MD       [place]  Visits from Start of Care: 6   Plan: Patient agrees to discharge.  Patient goals were partially met. Patient is being discharged due to being pleased with the current functional level.  ?????       Problem List Patient Active Problem List   Diagnosis Date Noted  . Rotator cuff tear, right 11/21/2011     Scot Jun, PTA  01/03/2015, 3:14 PM  [address] [place], [place], [address] Phone: [phone]   Fax:  [phone]  Name: Cheryl Cooper MRN: [id number] Date of Birth: 05-Feb-1941

## 2015-01-04 ENCOUNTER — Ambulatory Visit (INDEPENDENT_AMBULATORY_CARE_PROVIDER_SITE_OTHER): Payer: Medicare Other | Admitting: Diagnostic Neuroimaging

## 2015-01-04 ENCOUNTER — Encounter: Payer: Self-pay | Admitting: Diagnostic Neuroimaging

## 2015-01-04 VITALS — BP 130/74 | HR 63 | Ht 63.0 in | Wt 164.0 lb

## 2015-01-04 DIAGNOSIS — I639 Cerebral infarction, unspecified: Secondary | ICD-10-CM | POA: Insufficient documentation

## 2015-01-04 DIAGNOSIS — F0781 Postconcussional syndrome: Secondary | ICD-10-CM | POA: Insufficient documentation

## 2015-01-04 DIAGNOSIS — R42 Dizziness and giddiness: Secondary | ICD-10-CM | POA: Diagnosis not present

## 2015-01-04 MED ORDER — ASPIRIN 81 MG PO TABS: 81.0000 mg | ORAL_TABLET | Freq: Every day | ORAL | Status: DC

## 2015-01-04 NOTE — Patient Instructions (Signed)
Thank you for coming to see [place] at [facility]. I hope we have been able to provide you high quality care today.  You may receive a patient satisfaction survey over the next few weeks. We would appreciate your feedback and comments so that we may continue to improve ourselves and the health of our patients.  - continue treatment of blood pressure, diabetes and high cholesterol - start daily aspirin 16m daily   ~~~~~~~~~~~~~~~~~~~~~~~~~~~~~~~~~~~~~~~~~~~~~~~~~~~~~~~~~~~~~~~~~  DR. Veleka Djordjevic'S GUIDE TO HAPPY AND HEALTHY LIVING These are some of my general health and wellness recommendations. Some of them may apply to you better than others. Please use common sense as you try these suggestions and feel free to ask me any questions.   ACTIVITY/FITNESS Mental, social, emotional and physical stimulation are very important for brain and body health. Try learning a new activity (arts, music, language, sports, games).  Keep moving your body to the best of your abilities. You can do this at home, inside or outside, the park, community center, gym or anywhere you like. Consider a physical therapist or personal trainer to get started. Consider the app Sworkit. Fitness trackers such as smart-watches, smart-phones or Fitbits can help as well.   NUTRITION Eat more plants: colorful vegetables, nuts, seeds and berries.  Eat less sugar, salt, preservatives and processed foods.  Avoid toxins such as cigarettes and alcohol.  Drink water when you are thirsty. Warm water with a slice of lemon is an excellent morning drink to start the day.  Consider these websites for more information The Nutrition Source (hhttps://www.henry-hernandez.biz/ [employer] Nutrition (wWindowBlog.ch   RELAXATION Consider practicing mindfulness meditation or other relaxation techniques such as deep breathing, prayer, yoga, tai chi, massage. See website mindful.org or  the apps Headspace or Calm to help get started.   SLEEP Try to get at least 7-8+ hours sleep per day. Regular exercise and reduced caffeine will help you sleep better. Practice good sleep hygeine techniques. See website sleep.org for more information.   PLANNING Prepare estate planning, living will, healthcare POA documents. Sometimes this is best planned with the help of an attorney. Theconversationproject.org and agingwithdignity.org are excellent resources.

## 2015-01-04 NOTE — Progress Notes (Signed)
[facility]  PATIENT: DEZLYN ABERNATHY DOB: 1940-04-24  REFERRING CLINICIAN: Everly  HISTORY FROM: patient and husband  REASON FOR VISIT: follow up   HISTORICAL  CHIEF COMPLAINT:  Chief Complaint  Patient presents with  . Post concussion syndrome    rm 7  . Follow-up    1 month    HISTORY OF PRESENT ILLNESS:   UPDATE 01/04/15: Since last visit, symptoms are improved. Still with general balance diff, esp in early AM and uneven surfaces. Not on daily aspirin. Has done PT eval.   PRIOR HPI (12/02/14): 74 year old right-handed female here for evaluation of postconcussion syndrome. 11/17/2014 patient was visiting family in [place], in the garage putting a broom away. All of a sudden she felt very dizzy and felt like she was losing her balance. She does not remember exactly what happened next. Apparently she staggered backwards, fell down onto the concrete floor and had a severe scalp laceration. Patient tried to stand up from laying position but was unable to sit up. She felt very swimmy headed. Ambulance was called by her family and she is taken to local emergency room. Her blood pressure was noted to be 189/90. She had CT scan of the head which was apparently unremarkable. EKG and blood testing were unremarkable. She had "staples" to her scalp and was discharged from the emergency room. Since then patient has continued to have dizziness, pressure on the bitemporal region, neck pain, slowness of words, physical movements and balance. No vision changes. Over the past 6 months patient has had increasing balance and gait difficulties. She fell twice in the last 6 months. She's been having more balance difficulties in general. More close calls and stumbling/stripping on objects.   REVIEW OF SYSTEMS: Full 14 system review of systems performed and notable only fheadache sleepiness dizziness depression anxiety decreased energy.   ALLERGIES: Allergies  Allergen Reactions  .  Codeine Nausea And Vomiting  . Other Nausea And Vomiting    Clidinium & chlordiazepoxide (Librax), dizziness    HOME MEDICATIONS: Outpatient Prescriptions Prior to Visit  Medication Sig Dispense Refill  . clonazePAM (KLONOPIN) 0.5 MG tablet Take 0.5 mg by mouth daily.    [name] escitalopram (LEXAPRO) 10 MG tablet 10 mg.  3  . esomeprazole (NEXIUM) 40 MG capsule 40 mg.  7  . ezetimibe-simvastatin (VYTORIN) 10-80 MG per tablet Take 1 tablet by mouth at bedtime.    [name] HYDROcodone-acetaminophen (NORCO) 5-325 MG per tablet Take 1-2 tablets by mouth every 4 (four) hours as needed for pain. MAXIMUM TOTAL ACETAMINOPHEN DOSE IS 4000 MG PER DAY 40 tablet 1  . L-Methylfolate-B6-B12 (VITACIRC-B) 3-35-2 MG TABS Take 1 tablet by mouth daily.    [name] lamoTRIgine (LAMICTAL) 100 MG tablet Take 100 mg by mouth every morning.     [name] levothyroxine (SYNTHROID, LEVOTHROID) 75 MCG tablet Take 75 mcg by mouth daily with breakfast.    . losartan (COZAAR) 50 MG tablet 50 mg.  1  . oxybutynin (DITROPAN-XL) 10 MG 24 hr tablet 10 mg.  9  . triamcinolone cream (KENALOG) 0.1 % APP AA ON THE SKIN BID FOR 2 WEEKS  0   No facility-administered medications prior to visit.    PAST MEDICAL HISTORY: Past Medical History  Diagnosis Date  . Hypertension   . GERD (gastroesophageal reflux disease)   . Diabetes mellitus     diet controlled  . PONV (postoperative nausea and vomiting) yrs ago, none recent  . Deafness in left ear   .  Hypothyroid     PAST SURGICAL HISTORY: Past Surgical History  Procedure Laterality Date  . Abdominal hysterectomy  1986  . R foot surgery  1995  . Right hand surgery  2001  . Left rotator cuff  2001  . Left elobow surgery  2003  . Right index finger surgery  2009  . Back surgery  2010    lower  . Shoulder open rotator cuff repair  11/21/2011    Procedure: ROTATOR CUFF REPAIR SHOULDER OPEN;  Surgeon: Johnn Hai, MD;  Location: [facility];  Service: Orthopedics;  Laterality: Right;  with  subacromial decompression    FAMILY HISTORY: Family History  Problem Relation Age of Onset  . Stroke Father   . Stroke Sister   . Stroke Brother   . Stroke Sister     TIA    SOCIAL HISTORY:  Social History   Social History  . Marital Status: Married    Spouse Name: Herschel  . Number of Children: 3  . Years of Education: 14   Occupational History  .      retired   Social History Main Topics  . Smoking status: Never Smoker   . Smokeless tobacco: Never Used  . Alcohol Use: Yes     Comment: glass wine/day  . Drug Use: No  . Sexual Activity: Not on file   Other Topics Concern  . Not on file   Social History Narrative   Married, lives at home with husband   caffeine - 1 Coke daily     PHYSICAL EXAM  GENERAL EXAM/CONSTITUTIONAL: Vitals:  Filed Vitals:   01/04/15 1456  BP: 130/74  Pulse: 63  Height: 5\' 3"  (1.6 m)  Weight: 164 lb (74.39 kg)   Body mass index is 29.06 kg/(m^2). No exam data present  Patient is in no distress; well developed, nourished and groomed; neck is supple  CARDIOVASCULAR:  Examination of carotid arteries is normal; no carotid bruits  Regular rate and rhythm, no murmurs  Examination of peripheral vascular system by observation and palpation is normal  EYES:  Ophthalmoscopic exam of optic discs and posterior segments is normal; no papilledema or hemorrhages  MUSCULOSKELETAL:  Gait, strength, tone, movements noted in Neurologic exam below  NEUROLOGIC: MENTAL STATUS:  No flowsheet data found.  awake, alert, oriented to person, place and time  recent and remote memory intact  normal attention and concentration  language fluent, comprehension intact, naming intact,   fund of knowledge appropriate  CRANIAL NERVE:   2nd - no papilledema on fundoscopic exam  2nd, 3rd, 4th, 6th - pupils equal and reactive to light, visual fields full to confrontation, extraocular muscles intact, no nystagmus  5th - facial sensation  symmetric  7th - facial strength symmetric  8th - hearing intact  9th - palate elevates symmetrically, uvula midline  11th - shoulder shrug symmetric  12th - tongue protrusion midline  MASKED FACIES  MOTOR:   normal bulk and tone, full strength in the BUE, BLE; MILD COGWHEELING IN BUE; MILD BRADYKINESIA IN BUE  SENSORY:   normal and symmetric to light touch, temperature, vibration  COORDINATION:   finger-nose-finger, fine finger movements --> SLOW  REFLEXES:   deep tendon reflexes present and symmetric; BRISK IN LEGS  POSITIVE MYERSONS, SNOUT AND PALMOMENTAL REFLEXES  GAIT/STATION:   narrow based gait; UNSTEADY; DECR ARM SWING; MILD ATAXIA    DIAGNOSTIC DATA (LABS, IMAGING, TESTING) - I reviewed patient records, labs, notes, testing and imaging myself where available.  Lab Results  Component Value Date   WBC 6.3 11/18/2011   HGB 14.6 11/18/2011   HCT 43.5 11/18/2011   MCV 88.2 11/18/2011   PLT 220 11/18/2011      Component Value Date/Time   NA 136 11/22/2011 0401   K 4.3 11/22/2011 0401   CL 102 11/22/2011 0401   CO2 24 11/22/2011 0401   GLUCOSE 134* 11/22/2011 0401   BUN 12 11/22/2011 0401   CREATININE 0.84 11/22/2011 0401   CALCIUM 9.2 11/22/2011 0401   PROT 7.4 12/12/2008 1025   ALBUMIN 3.7 12/12/2008 1025   AST 51* 12/12/2008 1025   ALT 53* 12/12/2008 1025   ALKPHOS 60 12/12/2008 1025   BILITOT 0.6 12/12/2008 1025   GFRNONAA 69* 11/22/2011 0401   GFRAA 80* 11/22/2011 0401   No results found for: CHOL, HDL, LDLCALC, LDLDIRECT, TRIG, CHOLHDL Lab Results  Component Value Date   HGBA1C 6.8* 12/02/2014   Lab Results  Component Value Date   [id number] 12/02/2014   Lab Results  Component Value Date   TSH 3.370 12/02/2014     10/09/08 MRI LUMBAR [I reviewed images myself and agree with interpretation. -VRP]  1. Advanced degenerative disc disease at L5-S1 with a broad-based bulging annulus and osteophytic ridging contributing to mild  foraminal stenosis bilaterally, left greater than right.  2. Annular rent and central disc protrusion at L4-5. This in combination with hypertrophic facet disease creates mild to moderate right lateral recess stenosis and could impinge on the right L5 nerve root. 3. Broad-based bulging annulus at L3-4 but no significant spinal or foraminal stenosis.  12/08/14 MRI brain [I reviewed images myself and agree with interpretation. Chronic ischemic changes. Nothing acute. -VRP]  1. Foci in the left mid pons and left lower midbrain consistent with chronic lacunar infarctions. 2. Moderate number of T2/FLAIR hyperintense foci in the hemispheres most consistent with chronic microvascular ischemic change. The extent is more than expected for age. 3. Mild cortical atrophy 4. There are no acute findings.  12/08/14 MRI cervical spine: [I reviewed images myself and agree with interpretation. No cord issues noted. -VRP]  1. At C3-C4, there is moderate right foraminal narrowing due to mild disc bulging and facet hypertrophy. Although there is no definite nerve root compression there is some encroachment upon the exiting right C4 nerve root. 2. At C6-C7 there is moderate loss of disc height, broad disc bulging, mild uncovertebral spurring. This leads to moderate left foraminal narrowing. Although there is no definite nerve root compression there is some encroachment upon the exiting left C7 nerve root. 3. Degenerative changes at C4-C5 and C5-C6 are less likely to lead to nerve root impingement. 4. The spinal cord appears normal.  5. A possible chronic lacunar infarction in the pons is noted on sagittal images. 6. There are no acute findings.  12/08/14 carotid u/s  - no ICA stenosis  12/16/14 TTE  - Vigorous LV systolic function; grade 1 diastolic dysfunction; aortic sclerosis.      ASSESSMENT AND PLAN  74 y.o. year old female here with progressive gait and balance difficulty, with  episode of lightheadedness, dizziness, syncope, resulting in head trauma and postconcussion syndrome, now improved.  The progressive 6 month gait and balance difficulty problem and neurologic exam findings of bradykinesia, gait difficulty, masked facies, decreased arm swing, raise possibility of parkinsonism. Also could be related to prior brainstem strokes.  The event on 11/13/2014 of dizziness and passing out, leading to head trauma and concussion, could have been either  TIA, syncope or balance difficulty due to prior brainstem strokes.    Dx:   Post concussion syndrome  Dizziness and giddiness  Brainstem stroke (HCC)    PLAN: - continue treatment of BP, diabetes and high cholesterol - start daily aspirin 81mg  daily  Return in about 6 months (around 07/04/2015).    Penni Bombard, MD [id number], [id number] PM Certified in Neurology, Neurophysiology and Neuroimaging  [facility] [address] [place], [place] [address] [phone]

## 2015-01-19 ENCOUNTER — Ambulatory Visit: Payer: Medicare Other | Admitting: Diagnostic Neuroimaging

## 2015-01-26 ENCOUNTER — Encounter: Payer: Self-pay | Admitting: Diagnostic Neuroimaging

## 2015-01-30 ENCOUNTER — Telehealth: Payer: Self-pay | Admitting: *Deleted

## 2015-01-30 NOTE — Telephone Encounter (Addendum)
LVM requesting patient call back to schedule FU in 3-4 weeks, no sooner, for re-evaluaiton per Dr Leta Baptist. Advised she may schedule FU with phone staff. Left this caller's name and office number.  01/30/15 5:05 pm  Did not recieve callback. Sent my chart e mail back to patient and req she call to schedule FU in 3-4 weeks; gave number.

## 2015-01-31 NOTE — Telephone Encounter (Signed)
Patient called back, appointment scheduled 02/22/14.

## 2015-02-14 ENCOUNTER — Ambulatory Visit (INDEPENDENT_AMBULATORY_CARE_PROVIDER_SITE_OTHER): Payer: Medicare Other | Admitting: Diagnostic Neuroimaging

## 2015-02-14 ENCOUNTER — Encounter: Payer: Self-pay | Admitting: Diagnostic Neuroimaging

## 2015-02-14 VITALS — BP 147/79 | HR 53 | Wt 167.0 lb

## 2015-02-14 DIAGNOSIS — I639 Cerebral infarction, unspecified: Secondary | ICD-10-CM

## 2015-02-14 DIAGNOSIS — F0781 Postconcussional syndrome: Secondary | ICD-10-CM | POA: Diagnosis not present

## 2015-02-14 DIAGNOSIS — G20 Parkinson's disease: Secondary | ICD-10-CM | POA: Diagnosis not present

## 2015-02-14 DIAGNOSIS — R42 Dizziness and giddiness: Secondary | ICD-10-CM | POA: Diagnosis not present

## 2015-02-14 NOTE — Patient Instructions (Signed)

## 2015-02-14 NOTE — Progress Notes (Signed)
[facility]  PATIENT: Cheryl Cooper DOB: 06-26-1940  REFERRING CLINICIAN: Everly  HISTORY FROM: patient and husband  REASON FOR VISIT: follow up   HISTORICAL  CHIEF COMPLAINT:  Chief Complaint  Patient presents with  . Balance, post concussion syndrome    rm 6, husband, "balance worse in last 6 weeks'  . Follow-up    6 weeks    HISTORY OF PRESENT ILLNESS:   UPDATE 02/14/15: Since last visit, balance still off. Still with blood pressure fluctuations, esp high BP in evening.   UPDATE 01/04/15: Since last visit, symptoms are improved. Still with general balance diff, esp in early AM and uneven surfaces. Not on daily aspirin. Has done PT eval.   PRIOR HPI (12/02/14): 74 year old right-handed female here for evaluation of postconcussion syndrome. 11/17/2014 patient was visiting family in [place], in the garage putting a broom away. All of a sudden she felt very dizzy and felt like she was losing her balance. She does not remember exactly what happened next. Apparently she staggered backwards, fell down onto the concrete floor and had a severe scalp laceration. Patient tried to stand up from laying position but was unable to sit up. She felt very swimmy headed. Ambulance was called by her family and she is taken to local emergency room. Her blood pressure was noted to be 189/90. She had CT scan of the head which was apparently unremarkable. EKG and blood testing were unremarkable. She had "staples" to her scalp and was discharged from the emergency room. Since then patient has continued to have dizziness, pressure on the bitemporal region, neck pain, slowness of words, physical movements and balance. No vision changes. Over the past 6 months patient has had increasing balance and gait difficulties. She fell twice in the last 6 months. She's been having more balance difficulties in general. More close calls and stumbling/stripping on objects.   REVIEW OF SYSTEMS: Full 14  system review of systems performed and notable only fheadache sleepiness dizziness depression anxiety decreased energy.   ALLERGIES: Allergies  Allergen Reactions  . Codeine Nausea And Vomiting  . Other Nausea And Vomiting    Clidinium & chlordiazepoxide (Librax), dizziness    HOME MEDICATIONS: Outpatient Prescriptions Prior to Visit  Medication Sig Dispense Refill  . aspirin 81 MG tablet Take 1 tablet (81 mg total) by mouth daily. 30 tablet   . clonazePAM (KLONOPIN) 0.5 MG tablet Take 0.5 mg by mouth daily.    [name] escitalopram (LEXAPRO) 10 MG tablet 10 mg.  3  . esomeprazole (NEXIUM) 40 MG capsule 40 mg.  7  . ezetimibe-simvastatin (VYTORIN) 10-80 MG per tablet Take 1 tablet by mouth at bedtime.    [name] HYDROcodone-acetaminophen (NORCO) 5-325 MG per tablet Take 1-2 tablets by mouth every 4 (four) hours as needed for pain. MAXIMUM TOTAL ACETAMINOPHEN DOSE IS 4000 MG PER DAY 40 tablet 1  . L-Methylfolate-B6-B12 (VITACIRC-B) 3-35-2 MG TABS Take 1 tablet by mouth daily.    [name] lamoTRIgine (LAMICTAL) 100 MG tablet Take 100 mg by mouth every morning.     [name] levothyroxine (SYNTHROID, LEVOTHROID) 75 MCG tablet Take 75 mcg by mouth daily with breakfast.    . losartan (COZAAR) 50 MG tablet 50 mg.  1  . oxybutynin (DITROPAN-XL) 10 MG 24 hr tablet 10 mg.  9  . triamcinolone cream (KENALOG) 0.1 % APP AA ON THE SKIN BID FOR 2 WEEKS  0   No facility-administered medications prior to visit.    PAST MEDICAL HISTORY: Past  Medical History  Diagnosis Date  . Hypertension   . GERD (gastroesophageal reflux disease)   . Diabetes mellitus     diet controlled  . PONV (postoperative nausea and vomiting) yrs ago, none recent  . Deafness in left ear   . Hypothyroid     PAST SURGICAL HISTORY: Past Surgical History  Procedure Laterality Date  . Abdominal hysterectomy  1986  . R foot surgery  1995  . Right hand surgery  2001  . Left rotator cuff  2001  . Left elobow surgery  2003  . Right index finger  surgery  2009  . Back surgery  2010    lower  . Shoulder open rotator cuff repair  11/21/2011    Procedure: ROTATOR CUFF REPAIR SHOULDER OPEN;  Surgeon: Johnn Hai, MD;  Location: [facility];  Service: Orthopedics;  Laterality: Right;  with subacromial decompression    FAMILY HISTORY: Family History  Problem Relation Age of Onset  . Stroke Father   . Stroke Sister   . Stroke Brother   . Stroke Sister     TIA    SOCIAL HISTORY:  Social History   Social History  . Marital Status: Married    Spouse Name: Herschel  . Number of Children: 3  . Years of Education: 14   Occupational History  .      retired   Social History Main Topics  . Smoking status: Never Smoker   . Smokeless tobacco: Never Used  . Alcohol Use: Yes     Comment: glass wine/day  . Drug Use: No  . Sexual Activity: Not on file   Other Topics Concern  . Not on file   Social History Narrative   Married, lives at home with husband   caffeine - 1 Coke daily     PHYSICAL EXAM  GENERAL EXAM/CONSTITUTIONAL: Vitals:  Filed Vitals:   02/14/15 1449  BP: 147/79  Pulse: 53  Weight: 167 lb (75.751 kg)   Body mass index is 29.59 kg/(m^2). No exam data present  Patient is in no distress; well developed, nourished and groomed; neck is supple  MASKED FACIES  CARDIOVASCULAR:  Examination of carotid arteries is normal; no carotid bruits  Regular rate and rhythm, no murmurs  Examination of peripheral vascular system by observation and palpation is normal  EYES:  Ophthalmoscopic exam of optic discs and posterior segments is normal; no papilledema or hemorrhages  MUSCULOSKELETAL:  Gait, strength, tone, movements noted in Neurologic exam below  NEUROLOGIC: MENTAL STATUS:  No flowsheet data found.  awake, alert, oriented to person, place and time  recent and remote memory intact  normal attention and concentration  language fluent, comprehension intact, naming intact,   fund of knowledge  appropriate  CRANIAL NERVE:   2nd - no papilledema on fundoscopic exam  2nd, 3rd, 4th, 6th - pupils equal and reactive to light, visual fields full to confrontation, extraocular muscles intact, no nystagmus  5th - facial sensation symmetric  7th - facial strength symmetric  8th - hearing intact  9th - palate elevates symmetrically, uvula midline  11th - shoulder shrug symmetric  12th - tongue protrusion midline  MASKED FACIES  MOTOR:   normal bulk and tone, full strength in the BUE, BLE; MILD COGWHEELING IN BUE; MILD BRADYKINESIA IN BUE AND BLE  SENSORY:   normal and symmetric to light touch, temperature, vibration  COORDINATION:   finger-nose-finger, fine finger movements --> SLOW  REFLEXES:   deep tendon reflexes present and symmetric;  BRISK IN LEGS  POSITIVE MYERSONS, SNOUT AND PALMOMENTAL REFLEXES  GAIT/STATION:   narrow based gait; CAUTIOUS; DECR ARM SWING    DIAGNOSTIC DATA (LABS, IMAGING, TESTING) - I reviewed patient records, labs, notes, testing and imaging myself where available.  Lab Results  Component Value Date   WBC 6.3 11/18/2011   HGB 14.6 11/18/2011   HCT 43.5 11/18/2011   MCV 88.2 11/18/2011   PLT 220 11/18/2011      Component Value Date/Time   NA 136 11/22/2011 0401   K 4.3 11/22/2011 0401   CL 102 11/22/2011 0401   CO2 24 11/22/2011 0401   GLUCOSE 134* 11/22/2011 0401   BUN 12 11/22/2011 0401   CREATININE 0.84 11/22/2011 0401   CALCIUM 9.2 11/22/2011 0401   PROT 7.4 12/12/2008 1025   ALBUMIN 3.7 12/12/2008 1025   AST 51* 12/12/2008 1025   ALT 53* 12/12/2008 1025   ALKPHOS 60 12/12/2008 1025   BILITOT 0.6 12/12/2008 1025   GFRNONAA 69* 11/22/2011 0401   GFRAA 80* 11/22/2011 0401   No results found for: CHOL, HDL, LDLCALC, LDLDIRECT, TRIG, CHOLHDL Lab Results  Component Value Date   HGBA1C 6.8* 12/02/2014   Lab Results  Component Value Date   [id number] 12/02/2014   Lab Results  Component Value Date   TSH  3.370 12/02/2014     10/09/08 MRI LUMBAR [I reviewed images myself and agree with interpretation. -VRP]  1. Advanced degenerative disc disease at L5-S1 with a broad-based bulging annulus and osteophytic ridging contributing to mild foraminal stenosis bilaterally, left greater than right.  2. Annular rent and central disc protrusion at L4-5. This in combination with hypertrophic facet disease creates mild to moderate right lateral recess stenosis and could impinge on the right L5 nerve root. 3. Broad-based bulging annulus at L3-4 but no significant spinal or foraminal stenosis.  12/08/14 MRI brain [I reviewed images myself and agree with interpretation. Chronic ischemic changes. Nothing acute. -VRP]  1. Foci in the left mid pons and left lower midbrain consistent with chronic lacunar infarctions. 2. Moderate number of T2/FLAIR hyperintense foci in the hemispheres most consistent with chronic microvascular ischemic change. The extent is more than expected for age. 3. Mild cortical atrophy 4. There are no acute findings.  12/08/14 MRI cervical spine: [I reviewed images myself and agree with interpretation. No cord issues noted. -VRP]  1. At C3-C4, there is moderate right foraminal narrowing due to mild disc bulging and facet hypertrophy. Although there is no definite nerve root compression there is some encroachment upon the exiting right C4 nerve root. 2. At C6-C7 there is moderate loss of disc height, broad disc bulging, mild uncovertebral spurring. This leads to moderate left foraminal narrowing. Although there is no definite nerve root compression there is some encroachment upon the exiting left C7 nerve root. 3. Degenerative changes at C4-C5 and C5-C6 are less likely to lead to nerve root impingement. 4. The spinal cord appears normal.  5. A possible chronic lacunar infarction in the pons is noted on sagittal images. 6. There are no acute findings.  12/08/14 carotid u/s    - no ICA stenosis  12/16/14 TTE  - Vigorous LV systolic function; grade 1 diastolic dysfunction; aortic sclerosis.      ASSESSMENT AND PLAN  74 y.o. year old female here with progressive gait and balance difficulty, with episode of lightheadedness, dizziness, syncope, resulting in head trauma and postconcussion syndrome, now improved.  The progressive 6 month gait and balance difficulty problem and  neurologic exam findings of bradykinesia, gait difficulty, masked facies, decreased arm swing, raise possibility of parkinsonism (? parkinson's plus vs dementia with lewy bodies). Will hold off on carb/levo at this time since balance issues predominate, not tremor or rigidity. Also balance issues could be related to prior brainstem stroke sequelae.  The event on 11/13/2014 of dizziness and passing out, leading to head trauma and concussion, could have been either TIA, syncope or balance difficulty due to prior brainstem strokes.    Dx:  Post concussion syndrome  Dizziness and giddiness  Brainstem stroke (HCC)  Parkinsonism, unspecified Parkinsonism type ([place])    PLAN: - continue treatment of BP, diabetes and high cholesterol + aspirin 81mg  daily - continue PT exercises  Return in about 6 months (around 08/15/2015).    Penni Bombard, MD [id number], [id number] PM Certified in Neurology, Neurophysiology and Neuroimaging  [facility] [address] [place], [place] [address] [phone]

## 2015-02-23 ENCOUNTER — Ambulatory Visit: Payer: Medicare Other | Admitting: Diagnostic Neuroimaging

## 2015-07-03 ENCOUNTER — Telehealth: Payer: Self-pay | Admitting: Diagnostic Neuroimaging

## 2015-07-03 NOTE — Telephone Encounter (Signed)
Answer service: Pt wanted to get clarification about appt time. I called her back and told pt. She expressed understanding.

## 2015-07-04 ENCOUNTER — Encounter: Payer: Self-pay | Admitting: Diagnostic Neuroimaging

## 2015-07-04 ENCOUNTER — Ambulatory Visit (INDEPENDENT_AMBULATORY_CARE_PROVIDER_SITE_OTHER): Payer: Medicare Other | Admitting: Diagnostic Neuroimaging

## 2015-07-04 VITALS — BP 122/67 | HR 59 | Ht 63.0 in | Wt 159.0 lb

## 2015-07-04 DIAGNOSIS — I639 Cerebral infarction, unspecified: Secondary | ICD-10-CM | POA: Diagnosis not present

## 2015-07-04 DIAGNOSIS — F0781 Postconcussional syndrome: Secondary | ICD-10-CM | POA: Diagnosis not present

## 2015-07-04 NOTE — Progress Notes (Signed)
[facility]  PATIENT: Cheryl Cooper DOB: 25-Nov-1940  REFERRING CLINICIAN: Everly  HISTORY FROM: patient and husband  REASON FOR VISIT: follow up   HISTORICAL  CHIEF COMPLAINT:  Chief Complaint  Patient presents with  . Post concussion syndrome    rm 9, hx CVA, husband- Herschel, "pain in L leg, constant, no relieving facrtors; occas numbness L hand"  . Follow-up    6 month    HISTORY OF PRESENT ILLNESS:   UPDATE 07/04/15: Since last visit, doing better with balance, except fell off treadmill x 1 --> 3 weeks ago. Still with intermittent insomnia, worry/anxiety at night.  UPDATE 02/14/15: Since last visit, balance still off. Still with blood pressure fluctuations, esp high BP in evening.   UPDATE 01/04/15: Since last visit, symptoms are improved. Still with general balance diff, esp in early AM and uneven surfaces. Not on daily aspirin. Has done PT eval.   PRIOR HPI (12/02/14): 75 year old right-handed female here for evaluation of postconcussion syndrome. 11/17/2014 patient was visiting family in [place], in the garage putting a broom away. All of a sudden she felt very dizzy and felt like she was losing her balance. She does not remember exactly what happened next. Apparently she staggered backwards, fell down onto the concrete floor and had a severe scalp laceration. Patient tried to stand up from laying position but was unable to sit up. She felt very swimmy headed. Ambulance was called by her family and she is taken to local emergency room. Her blood pressure was noted to be 189/90. She had CT scan of the head which was apparently unremarkable. EKG and blood testing were unremarkable. She had "staples" to her scalp and was discharged from the emergency room. Since then patient has continued to have dizziness, pressure on the bitemporal region, neck pain, slowness of words, physical movements and balance. No vision changes. Over the past 6 months patient has had  increasing balance and gait difficulties. She fell twice in the last 6 months. She's been having more balance difficulties in general. More close calls and stumbling/stripping on objects.   REVIEW OF SYSTEMS: Full 14 system review of systems performed and notable only for headache excess thirst  hearing loss ringing in ears.   ALLERGIES: Allergies  Allergen Reactions  . Atorvastatin Other (See Comments)    Unbalanced  . Chlordiazepoxide-Clidinium     Other reaction(s): Dizziness (intolerance)  . Codeine Nausea And Vomiting  . Other Nausea And Vomiting    Clidinium & chlordiazepoxide (Librax), dizziness    HOME MEDICATIONS: Outpatient Prescriptions Prior to Visit  Medication Sig Dispense Refill  . aspirin 81 MG tablet Take 1 tablet (81 mg total) by mouth daily. 30 tablet   . clonazePAM (KLONOPIN) 0.5 MG tablet Take 0.5 mg by mouth daily.    [name] escitalopram (LEXAPRO) 10 MG tablet 10 mg.  3  . esomeprazole (NEXIUM) 40 MG capsule 40 mg.  7  . ezetimibe-simvastatin (VYTORIN) 10-80 MG per tablet Take 1 tablet by mouth at bedtime.    [name] HYDROcodone-acetaminophen (NORCO) 5-325 MG per tablet Take 1-2 tablets by mouth every 4 (four) hours as needed for pain. MAXIMUM TOTAL ACETAMINOPHEN DOSE IS 4000 MG PER DAY 40 tablet 1  . L-Methylfolate-B6-B12 (VITACIRC-B) 3-35-2 MG TABS Take 1 tablet by mouth daily.    [name] lamoTRIgine (LAMICTAL) 100 MG tablet Take 100 mg by mouth every morning.     [name] levothyroxine (SYNTHROID, LEVOTHROID) 75 MCG tablet Take 75 mcg by mouth daily with breakfast.    .  losartan (COZAAR) 50 MG tablet 50 mg.  1  . oxybutynin (DITROPAN-XL) 10 MG 24 hr tablet 10 mg.  9  . triamcinolone cream (KENALOG) 0.1 % APP AA ON THE SKIN BID FOR 2 WEEKS  0   No facility-administered medications prior to visit.    PAST MEDICAL HISTORY: Past Medical History  Diagnosis Date  . Hypertension   . GERD (gastroesophageal reflux disease)   . Diabetes mellitus     diet controlled  . PONV  (postoperative nausea and vomiting) yrs ago, none recent  . Deafness in left ear   . Hypothyroid     PAST SURGICAL HISTORY: Past Surgical History  Procedure Laterality Date  . Abdominal hysterectomy  1986  . R foot surgery  1995  . Right hand surgery  2001  . Left rotator cuff  2001  . Left elobow surgery  2003  . Right index finger surgery  2009  . Back surgery  2010    lower  . Shoulder open rotator cuff repair  11/21/2011    Procedure: ROTATOR CUFF REPAIR SHOULDER OPEN;  Surgeon: Johnn Hai, MD;  Location: [facility];  Service: Orthopedics;  Laterality: Right;  with subacromial decompression    FAMILY HISTORY: Family History  Problem Relation Age of Onset  . Stroke Father   . Stroke Sister   . Stroke Brother   . Stroke Sister     TIA    SOCIAL HISTORY:  Social History   Social History  . Marital Status: Married    Spouse Name: Herschel  . Number of Children: 3  . Years of Education: 14   Occupational History  .      retired   Social History Main Topics  . Smoking status: Never Smoker   . Smokeless tobacco: Never Used  . Alcohol Use: Yes     Comment: glass wine/day  . Drug Use: No  . Sexual Activity: Not on file   Other Topics Concern  . Not on file   Social History Narrative   Married, lives at home with husband   caffeine - 1 Coke daily     PHYSICAL EXAM  GENERAL EXAM/CONSTITUTIONAL: Vitals:  Filed Vitals:   07/04/15 0821  BP: 122/67  Pulse: 59  Height: 5\' 3"  (1.6 m)  Weight: 159 lb (72.122 kg)   Body mass index is 28.17 kg/(m^2). No exam data present  Patient is in no distress; well developed, nourished and groomed; neck is supple  MASKED FACIES  CARDIOVASCULAR:  Examination of carotid arteries is normal; no carotid bruits  Regular rate and rhythm, no murmurs  Examination of peripheral vascular system by observation and palpation is normal  EYES:  Ophthalmoscopic exam of optic discs and posterior segments is normal; no  papilledema or hemorrhages  MUSCULOSKELETAL:  Gait, strength, tone, movements noted in Neurologic exam below  NEUROLOGIC: MENTAL STATUS:  No flowsheet data found.  awake, alert, oriented to person, place and time  recent and remote memory intact  normal attention and concentration  language fluent, comprehension intact, naming intact,   fund of knowledge appropriate  CRANIAL NERVE:   2nd - no papilledema on fundoscopic exam  2nd, 3rd, 4th, 6th - pupils equal and reactive to light, visual fields full to confrontation, extraocular muscles intact, no nystagmus  5th - facial sensation symmetric  7th - facial strength symmetric  8th - hearing intact  9th - palate elevates symmetrically, uvula midline  11th - shoulder shrug symmetric  12th - tongue  protrusion midline  MASKED FACIES  MOTOR:   normal bulk and tone, full strength in the BUE, BLE; NO COGWHEELING; MILD BRADYKINESIA IN BUE AND BLE  SENSORY:   normal and symmetric to light touch, temperature, vibration  COORDINATION:   finger-nose-finger, fine finger movements --> SLOW  REFLEXES:   deep tendon reflexes present and symmetric; BRISK IN LEGS  POSITIVE MYERSONS, SNOUT AND PALMOMENTAL REFLEXES  GAIT/STATION:   narrow based gait; CAUTIOUS; DECR RIGHT ARM SWING    DIAGNOSTIC DATA (LABS, IMAGING, TESTING) - I reviewed patient records, labs, notes, testing and imaging myself where available.  Lab Results  Component Value Date   WBC 6.3 11/18/2011   HGB 14.6 11/18/2011   HCT 43.5 11/18/2011   MCV 88.2 11/18/2011   PLT 220 11/18/2011      Component Value Date/Time   NA 136 11/22/2011 0401   K 4.3 11/22/2011 0401   CL 102 11/22/2011 0401   CO2 24 11/22/2011 0401   GLUCOSE 134* 11/22/2011 0401   BUN 12 11/22/2011 0401   CREATININE 0.84 11/22/2011 0401   CALCIUM 9.2 11/22/2011 0401   PROT 7.4 12/12/2008 1025   ALBUMIN 3.7 12/12/2008 1025   AST 51* 12/12/2008 1025   ALT 53* 12/12/2008 1025     ALKPHOS 60 12/12/2008 1025   BILITOT 0.6 12/12/2008 1025   GFRNONAA 69* 11/22/2011 0401   GFRAA 80* 11/22/2011 0401   No results found for: CHOL, HDL, LDLCALC, LDLDIRECT, TRIG, CHOLHDL Lab Results  Component Value Date   HGBA1C 6.8* 12/02/2014   Lab Results  Component Value Date   [id number] 12/02/2014   Lab Results  Component Value Date   TSH 3.370 12/02/2014     10/09/08 MRI LUMBAR [I reviewed images myself and agree with interpretation. -VRP]  1. Advanced degenerative disc disease at L5-S1 with a broad-based bulging annulus and osteophytic ridging contributing to mild foraminal stenosis bilaterally, left greater than right.  2. Annular rent and central disc protrusion at L4-5. This in combination with hypertrophic facet disease creates mild to moderate right lateral recess stenosis and could impinge on the right L5 nerve root. 3. Broad-based bulging annulus at L3-4 but no significant spinal or foraminal stenosis.  12/08/14 MRI brain [I reviewed images myself and agree with interpretation. Chronic ischemic changes. Nothing acute. -VRP]  1. Foci in the left mid pons and left lower midbrain consistent with chronic lacunar infarctions. 2. Moderate number of T2/FLAIR hyperintense foci in the hemispheres most consistent with chronic microvascular ischemic change. The extent is more than expected for age. 3. Mild cortical atrophy 4. There are no acute findings.  12/08/14 MRI cervical spine: [I reviewed images myself and agree with interpretation. No cord issues noted. -VRP]  1. At C3-C4, there is moderate right foraminal narrowing due to mild disc bulging and facet hypertrophy. Although there is no definite nerve root compression there is some encroachment upon the exiting right C4 nerve root. 2. At C6-C7 there is moderate loss of disc height, broad disc bulging, mild uncovertebral spurring. This leads to moderate left foraminal narrowing. Although there is no definite  nerve root compression there is some encroachment upon the exiting left C7 nerve root. 3. Degenerative changes at C4-C5 and C5-C6 are less likely to lead to nerve root impingement. 4. The spinal cord appears normal.  5. A possible chronic lacunar infarction in the pons is noted on sagittal images. 6. There are no acute findings.  12/08/14 carotid u/s  - no ICA stenosis  12/16/14  TTE  - Vigorous LV systolic function; grade 1 diastolic dysfunction; aortic sclerosis.      ASSESSMENT AND PLAN  75 y.o. year old female here with progressive gait and balance difficulty, with episode of lightheadedness, dizziness, syncope, resulting in head trauma and postconcussion syndrome, now improved.  The progressive gait and balance difficulty problem and neurologic exam findings of bradykinesia, gait difficulty, masked facies, decreased arm swing, raise possibility of parkinsonism (? parkinson's plus vs dementia with lewy bodies). Will hold off on carb/levo at this time since balance issues predominate, not tremor or rigidity. Also balance issues could be related to prior brainstem stroke sequelae.  The event on 11/13/2014 of dizziness and passing out, leading to head trauma and concussion, could have been either TIA, syncope or balance difficulty due to prior brainstem strokes.    Dx:  Post concussion syndrome  Brainstem stroke (HCC)    PLAN: I spent 25 minutes of face to face time with patient. Greater than 50% of time was spent in counseling and coordination of care with patient. In summary we discussed:  - continue treatment of BP, diabetes and high cholesterol + aspirin 81mg  daily - continue PT exercises; continue water therapy and consider yoga exercises  Return if symptoms worsen or fail to improve, for return to PCP.    Penni Bombard, MD [id number], [id number] AM Certified in Neurology, Neurophysiology and Neuroimaging  [facility] [address], [place], [place] [address] [phone]

## 2015-07-04 NOTE — Patient Instructions (Signed)
Thank you for coming to see us at [facility]. I hope we have been able to provide you high quality care today.  You may receive a patient satisfaction survey over the next few weeks. We would appreciate your feedback and comments so that we may continue to improve ourselves and the health of our patients.  - continue treatment of blood pressure, diabetes and high cholesterol + aspirin 81mg daily - continue water therapy and consider yoga exercises   ~~~~~~~~~~~~~~~~~~~~~~~~~~~~~~~~~~~~~~~~~~~~~~~~~~~~~~~~~~~~~~~~~  DR. 'S GUIDE TO HAPPY AND HEALTHY LIVING These are some of my general health and wellness recommendations. Some of them may apply to you better than others. Please use common sense as you try these suggestions and feel free to ask me any questions.   ACTIVITY/FITNESS Mental, social, emotional and physical stimulation are very important for brain and body health. Try learning a new activity (arts, music, language, sports, games).  Keep moving your body to the best of your abilities. You can do this at home, inside or outside, the park, community center, gym or anywhere you like. Consider a physical therapist or personal trainer to get started. Consider the app Sworkit. Fitness trackers such as smart-watches, smart-phones or Fitbits can help as well.   NUTRITION Eat more plants: colorful vegetables, nuts, seeds and berries.  Eat less sugar, salt, preservatives and processed foods.  Avoid toxins such as cigarettes and alcohol.  Drink water when you are thirsty. Warm water with a slice of lemon is an excellent morning drink to start the day.  Consider these websites for more information The Nutrition Source (https://www.hsph.[employer].edu/nutritionsource) Precision Nutrition (www.precisionnutrition.com/blog/infographics)   RELAXATION Consider practicing mindfulness meditation or other relaxation techniques such as deep breathing, prayer, yoga, tai  chi, massage. See website mindful.org or the apps Headspace or Calm to help get started.   SLEEP Try to get at least 7-8+ hours sleep per day. Regular exercise and reduced caffeine will help you sleep better. Practice good sleep hygeine techniques. See website sleep.org for more information.   PLANNING Prepare estate planning, living will, healthcare POA documents. Sometimes this is best planned with the help of an attorney. Theconversationproject.org and agingwithdignity.org are excellent resources. 

## 2016-02-15 ENCOUNTER — Ambulatory Visit (INDEPENDENT_AMBULATORY_CARE_PROVIDER_SITE_OTHER): Payer: Medicare Other | Admitting: Diagnostic Neuroimaging

## 2016-02-15 ENCOUNTER — Encounter: Payer: Self-pay | Admitting: Diagnostic Neuroimaging

## 2016-02-15 VITALS — BP 129/70 | HR 57 | Wt 157.2 lb

## 2016-02-15 DIAGNOSIS — I639 Cerebral infarction, unspecified: Secondary | ICD-10-CM

## 2016-02-15 DIAGNOSIS — F0781 Postconcussional syndrome: Secondary | ICD-10-CM

## 2016-02-15 DIAGNOSIS — R269 Unspecified abnormalities of gait and mobility: Secondary | ICD-10-CM

## 2016-02-15 DIAGNOSIS — R42 Dizziness and giddiness: Secondary | ICD-10-CM

## 2016-02-15 DIAGNOSIS — G20 Parkinson's disease: Secondary | ICD-10-CM

## 2016-02-15 NOTE — Progress Notes (Signed)
[facility]  PATIENT: Cheryl Cooper DOB: 1939/10/06  REFERRING CLINICIAN: Everly  HISTORY FROM: patient and husband  REASON FOR VISIT: follow up   HISTORICAL  CHIEF COMPLAINT:  Chief Complaint  Patient presents with  . Post concussion syndrome    rm 7,  husband- Herschel, "fallen x 3 , losing balance"  . Follow-up    6 month    HISTORY OF PRESENT ILLNESS:   UPDATE 02/15/16: Since last visit, balance is getting worse. Esp with bending, lifting, climbing. Has fallen down several times. Leans to the right side.   UPDATE 07/04/15: Since last visit, doing better with balance, except fell off treadmill x 1 --> 3 weeks ago. Still with intermittent insomnia, worry/anxiety at night.  UPDATE 02/14/15: Since last visit, balance still off. Still with blood pressure fluctuations, esp high BP in evening.   UPDATE 01/04/15: Since last visit, symptoms are improved. Still with general balance diff, esp in early AM and uneven surfaces. Not on daily aspirin. Has done PT eval.   PRIOR HPI (12/02/14): 75 year old right-handed female here for evaluation of postconcussion syndrome. 11/17/2014 patient was visiting family in [place], in the garage putting a broom away. All of a sudden she felt very dizzy and felt like she was losing her balance. She does not remember exactly what happened next. Apparently she staggered backwards, fell down onto the concrete floor and had a severe scalp laceration. Patient tried to stand up from laying position but was unable to sit up. She felt very swimmy headed. Ambulance was called by her family and she is taken to local emergency room. Her blood pressure was noted to be 189/90. She had CT scan of the head which was apparently unremarkable. EKG and blood testing were unremarkable. She had "staples" to her scalp and was discharged from the emergency room. Since then patient has continued to have dizziness, pressure on the bitemporal region, neck pain,  slowness of words, physical movements and balance. No vision changes. Over the past 6 months patient has had increasing balance and gait difficulties. She fell twice in the last 6 months. She's been having more balance difficulties in general. More close calls and stumbling/stripping on objects.   REVIEW OF SYSTEMS: Full 14 system review of systems performed and negative: except     ALLERGIES: Allergies  Allergen Reactions  . Atorvastatin Other (See Comments)    Unbalanced  . Chlordiazepoxide-Clidinium     Other reaction(s): Dizziness (intolerance)  . Codeine Nausea And Vomiting  . Other Nausea And Vomiting    Clidinium & chlordiazepoxide (Librax), dizziness    HOME MEDICATIONS: Outpatient Medications Prior to Visit  Medication Sig Dispense Refill  . amLODipine (NORVASC) 5 MG tablet Take 5 mg by mouth.    [name] aspirin 81 MG tablet Take 1 tablet (81 mg total) by mouth daily. 30 tablet   . clonazePAM (KLONOPIN) 0.5 MG tablet Take 0.5 mg by mouth daily.    [name] escitalopram (LEXAPRO) 10 MG tablet 10 mg.  3  . esomeprazole (NEXIUM) 40 MG capsule 40 mg.  7  . ezetimibe-simvastatin (VYTORIN) 10-80 MG per tablet Take 1 tablet by mouth at bedtime.    . lamoTRIgine (LAMICTAL) 100 MG tablet Take 100 mg by mouth every morning.     [name] levothyroxine (SYNTHROID, LEVOTHROID) 75 MCG tablet Take 75 mcg by mouth daily with breakfast.    . oxybutynin (DITROPAN-XL) 10 MG 24 hr tablet 10 mg.  9  . triamcinolone cream (KENALOG) 0.1 % APP  AA ON THE SKIN BID FOR 2 WEEKS  0  . HYDROcodone-acetaminophen (NORCO) 5-325 MG per tablet Take 1-2 tablets by mouth every 4 (four) hours as needed for pain. MAXIMUM TOTAL ACETAMINOPHEN DOSE IS 4000 MG PER DAY 40 tablet 1  . L-Methylfolate-B6-B12 (VITACIRC-B) 3-35-2 MG TABS Take 1 tablet by mouth daily.    [name] losartan (COZAAR) 50 MG tablet 50 mg.  1   No facility-administered medications prior to visit.     PAST MEDICAL HISTORY: Past Medical History:  Diagnosis Date  .  Deafness in left ear   . Diabetes mellitus    diet controlled  . GERD (gastroesophageal reflux disease)   . Hypertension   . Hypothyroid   . PONV (postoperative nausea and vomiting) yrs ago, none recent    PAST SURGICAL HISTORY: Past Surgical History:  Procedure Laterality Date  . ABDOMINAL HYSTERECTOMY  1986  . BACK SURGERY  2010   lower  . left elobow surgery  2003  . left rotator cuff  2001  . r foot surgery  1995  . right hand surgery  2001  . right index finger surgery  2009  . SHOULDER OPEN ROTATOR CUFF REPAIR  11/21/2011   Procedure: ROTATOR CUFF REPAIR SHOULDER OPEN;  Surgeon: Johnn Hai, MD;  Location: [facility];  Service: Orthopedics;  Laterality: Right;  with subacromial decompression    FAMILY HISTORY: Family History  Problem Relation Age of Onset  . Stroke Father   . Stroke Sister   . Stroke Brother   . Diabetes Brother   . Stroke Sister     TIA    SOCIAL HISTORY:  Social History   Social History  . Marital status: Married    Spouse name: Herschel  . Number of children: 3  . Years of education: 14   Occupational History  .      retired   Social History Main Topics  . Smoking status: Never Smoker  . Smokeless tobacco: Never Used  . Alcohol use Yes     Comment: glass wine/day  . Drug use: No  . Sexual activity: Not on file   Other Topics Concern  . Not on file   Social History Narrative   Married, lives at home with husband   caffeine - 1 Coke daily     PHYSICAL EXAM  GENERAL EXAM/CONSTITUTIONAL: Vitals:  Vitals:   02/15/16 1429  BP: 129/70  Pulse: (!) 57  Weight: 157 lb 3.2 oz (71.3 kg)   Body mass index is 27.85 kg/m. No exam data present  Patient is in no distress; well developed, nourished and groomed; neck is supple  MASKED FACIES  CARDIOVASCULAR:  Examination of carotid arteries is normal; no carotid bruits  Regular rate and rhythm, no murmurs  Examination of peripheral vascular system by observation and  palpation is normal  EYES:  Ophthalmoscopic exam of optic discs and posterior segments is normal; no papilledema or hemorrhages  MUSCULOSKELETAL:  Gait, strength, tone, movements noted in Neurologic exam below  NEUROLOGIC: MENTAL STATUS:  No flowsheet data found.  awake, alert, oriented to person, place and time  recent and remote memory intact  normal attention and concentration  language fluent, comprehension intact, naming intact,   fund of knowledge appropriate  CRANIAL NERVE:   2nd - no papilledema on fundoscopic exam  2nd, 3rd, 4th, 6th - pupils equal and reactive to light, visual fields full to confrontation, extraocular muscles intact, no nystagmus  5th - facial sensation symmetric  7th -  facial strength symmetric  8th - hearing intact  9th - palate elevates symmetrically, uvula midline  11th - shoulder shrug symmetric  12th - tongue protrusion midline  MASKED FACIES  MOTOR:   normal bulk and tone, full strength in the BUE, BLE; NO COGWHEELING; MILD BRADYKINESIA IN BUE AND BLE  SENSORY:   normal and symmetric to light touch, temperature, vibration  COORDINATION:   finger-nose-finger, fine finger movements --> SLOW  REFLEXES:   deep tendon reflexes present and symmetric; BRISK IN LEGS  POSITIVE MYERSONS, SNOUT AND PALMOMENTAL REFLEXES  GAIT/STATION:   narrow based gait; CAUTIOUS; DECR RIGHT ARM SWING    DIAGNOSTIC DATA (LABS, IMAGING, TESTING) - I reviewed patient records, labs, notes, testing and imaging myself where available.  Lab Results  Component Value Date   WBC 6.3 11/18/2011   HGB 14.6 11/18/2011   HCT 43.5 11/18/2011   MCV 88.2 11/18/2011   PLT 220 11/18/2011      Component Value Date/Time   NA 136 11/22/2011 0401   K 4.3 11/22/2011 0401   CL 102 11/22/2011 0401   CO2 24 11/22/2011 0401   GLUCOSE 134 (H) 11/22/2011 0401   BUN 12 11/22/2011 0401   CREATININE 0.84 11/22/2011 0401   CALCIUM 9.2 11/22/2011 0401    PROT 7.4 12/12/2008 1025   ALBUMIN 3.7 12/12/2008 1025   AST 51 (H) 12/12/2008 1025   ALT 53 (H) 12/12/2008 1025   ALKPHOS 60 12/12/2008 1025   BILITOT 0.6 12/12/2008 1025   GFRNONAA 69 (L) 11/22/2011 0401   GFRAA 80 (L) 11/22/2011 0401   No results found for: CHOL, HDL, LDLCALC, LDLDIRECT, TRIG, CHOLHDL Lab Results  Component Value Date   HGBA1C 6.8 (H) 12/02/2014   Lab Results  Component Value Date   [id number] 12/02/2014   Lab Results  Component Value Date   TSH 3.370 12/02/2014     10/09/08 MRI LUMBAR [I reviewed images myself and agree with interpretation. -VRP]  1. Advanced degenerative disc disease at L5-S1 with a broad-based bulging annulus and osteophytic ridging contributing to mild foraminal stenosis bilaterally, left greater than right.  2. Annular rent and central disc protrusion at L4-5. This in combination with hypertrophic facet disease creates mild to moderate right lateral recess stenosis and could impinge on the right L5 nerve root. 3. Broad-based bulging annulus at L3-4 but no significant spinal or foraminal stenosis.  12/08/14 MRI brain [I reviewed images myself and agree with interpretation. Chronic ischemic changes. Nothing acute. -VRP]  1. Foci in the left mid pons and left lower midbrain consistent with chronic lacunar infarctions. 2. Moderate number of T2/FLAIR hyperintense foci in the hemispheres most consistent with chronic microvascular ischemic change. The extent is more than expected for age. 3. Mild cortical atrophy 4. There are no acute findings.  12/08/14 MRI cervical spine: [I reviewed images myself and agree with interpretation. No cord issues noted. -VRP]  1. At C3-C4, there is moderate right foraminal narrowing due to mild disc bulging and facet hypertrophy. Although there is no definite nerve root compression there is some encroachment upon the exiting right C4 nerve root. 2. At C6-C7 there is moderate loss of disc height,  broad disc bulging, mild uncovertebral spurring. This leads to moderate left foraminal narrowing. Although there is no definite nerve root compression there is some encroachment upon the exiting left C7 nerve root. 3. Degenerative changes at C4-C5 and C5-C6 are less likely to lead to nerve root impingement. 4. The spinal cord appears normal.  5. A possible chronic lacunar infarction in the pons is noted on sagittal images. 6. There are no acute findings.  12/08/14 carotid u/s  - no ICA stenosis  12/16/14 TTE  - Vigorous LV systolic function; grade 1 diastolic dysfunction; aortic sclerosis.      ASSESSMENT AND PLAN  75 y.o. year old female here with progressive gait and balance difficulty, with episode of lightheadedness, dizziness, syncope, resulting in head trauma and postconcussion syndrome, now improved.  The progressive gait and balance difficulty problem and neurologic exam findings of bradykinesia, gait difficulty, masked facies, decreased arm swing, raise possibility of parkinsonism (? parkinson's plus vs dementia with lewy bodies). Will hold off on carb/levo at this time since balance issues predominate, not tremor or rigidity. Also balance issues could be related to prior brainstem stroke sequelae.  The event on 11/13/2014 of dizziness and passing out, leading to head trauma and concussion, could have been either TIA, syncope or balance difficulty due to prior brainstem strokes.    Dx:  Parkinsonism, unspecified Parkinsonism type ([place])  Dizziness and giddiness  Brainstem stroke ([place])  Post concussion syndrome  Gait difficulty    PLAN: - continue treatment of BP, diabetes and high cholesterol + aspirin 81mg  daily - continue PT exercises; continue water therapy and consider yoga exercises - use rollator walker to help reduce falls  Orders Placed This Encounter  Procedures  . For home use only DME 4 wheeled rolling walker with seat   Return in about 6  months (around 08/15/2016).    Penni Bombard, MD [id number], [id number] PM Certified in Neurology, Neurophysiology and Neuroimaging  [facility] [address] [place], [place] [address] [phone]

## 2016-02-15 NOTE — Patient Instructions (Signed)
-   use rollator walker  - continue physical therapy and water therapy exercises

## 2016-04-25 ENCOUNTER — Ambulatory Visit: Payer: Self-pay | Admitting: Orthopedic Surgery

## 2016-04-26 ENCOUNTER — Ambulatory Visit: Payer: Self-pay | Admitting: Orthopedic Surgery

## 2016-04-26 NOTE — H&P (Signed)
Cheryl Cooper is an 76 y.o. female.   Chief Complaint: R shoulder pain HPI: The patient is a 76 year old female who presents today for follow up of their shoulder. The patient is being followed for their right shoulder pain. They are 1 1/2 month out from flare up. Symptoms reported today include: pain. and report their pain level to be 5 / 10. Current treatment includes: NSAIDs (Aleve prn). The following medication has been used for pain control: antiinflammatory medication (prn). The patient presents today following MRI. The patient has reported improvement of their symptoms with: Cortisone injections.  Cheryl Cooper followup. Recheck right shoulder. She states that the injection did give her some relief. She is still having pain with certain motions and positions. She is hurting, but is considering if she needs surgery may be putting that off a little while there is some question of if her husband will need upcoming surgery on his neck or his back. She is otherwise taking just Aleve as needed. Still doing water aerobics, but she has been able to modify, avoid lifting that arm up overhead.  Past Medical History:  Diagnosis Date  . Deafness in left ear   . Diabetes mellitus    diet controlled  . GERD (gastroesophageal reflux disease)   . Hypertension   . Hypothyroid   . PONV (postoperative nausea and vomiting) yrs ago, none recent    Past Surgical History:  Procedure Laterality Date  . ABDOMINAL HYSTERECTOMY  1986  . BACK SURGERY  2010   lower  . left elobow surgery  2003  . left rotator cuff  2001  . r foot surgery  1995  . right hand surgery  2001  . right index finger surgery  2009  . SHOULDER OPEN ROTATOR CUFF REPAIR  11/21/2011   Procedure: ROTATOR CUFF REPAIR SHOULDER OPEN;  Surgeon: Johnn Hai, MD;  Location: [facility];  Service: Orthopedics;  Laterality: Right;  with subacromial decompression    Family History  Problem Relation Age of Onset  . Stroke Father   . Stroke  Sister   . Stroke Brother   . Diabetes Brother   . Stroke Sister     TIA   Social History:  reports that she has never smoked. She has never used smokeless tobacco. She reports that she drinks alcohol. She reports that she does not use drugs.  Allergies:  Allergies  Allergen Reactions  . Atorvastatin Other (See Comments)    Unbalanced  . Chlordiazepoxide-Clidinium     Other reaction(s): Dizziness (intolerance)  . Codeine Nausea And Vomiting  . Other Nausea And Vomiting    Clidinium & chlordiazepoxide (Librax), dizziness     (Not in a hospital admission)  No results found for this or any previous visit (from the past 48 hour(s)). No results found.  Review of Systems  Constitutional: Negative.   HENT: Negative.   Eyes: Negative.   Respiratory: Negative.   Cardiovascular: Negative.   Gastrointestinal: Negative.   Genitourinary: Negative.   Musculoskeletal: Positive for joint pain.  Skin: Negative.   Neurological: Negative.     There were no vitals taken for this visit. Physical Exam  Constitutional: She is oriented to person, place, and time. She appears well-developed.  HENT:  Head: Normocephalic.  Eyes: Pupils are equal, round, and reactive to light.  Neck: Normal range of motion.  Cardiovascular: Normal rate.   Respiratory: Effort normal.  GI: Soft.  Musculoskeletal:  On exam, she is well nourished, well developed,  awake, alert, and oriented x3, in no acute distress. Mildly tender subacromial space. Positive impingement and secondary impingement testing. Mildly decreased internal rotation. Full forward flexion. No instability. No weakness with upper extremity strength testing, abduction, biceps, triceps, internal and external rotation 5/5.  Neurological: She is alert and oriented to person, place, and time.  Skin: Skin is warm and dry.    MRI images and report reviewed today by Dr. Tonita Cong, retained hardware from prior rotator cuff repair. There is a recurrent full  thickness, full width tear of the supraspinatus tendon at the greater tuberosity with approximately 3.5 cm of retraction of the tendon. There is no associated atrophy. Some mild diffuse subscap and infraspinatus tendinosis, some degenerative changes in the glenohumeral joint including some labral degeneration. Mild thickening of the coracoacromial ligament.  Assessment/Plan Right shoulder recurrent rotator cuff tear.  Discussed relevant anatomy, reviewed her MRI images in detail. She does have a full thickness tear retracted 3.5 cm. There is no associated atrophy. We discussed possible rotator cuff repair. She would like to see if her husband is going to require surgery first, so they can work on the timing of that. I urged her that she does not want to delay this surgery too long, so as to avoid atrophy. If this would be unrepairable then that could lead to an eventual total shoulder replacement. She is aware of that. We discussed the procedure itself as well as risks, complications and alternatives including but not limited to DVT, PE, failure of procedure, need for secondary procedure, anesthesia risk, even death. We will proceed with scheduling appropriately. She does need clearance from her PCP, Dr. Suzy Bouchard. After we obtain that we will proceed with scheduling her for right shoulder mini open revision rotator cuff repair and subacromial decompression. She is still experiencing some relief from the injection. Discussed continued activity modifications in the interim and other postop protocols. The patient was seen in conjunction with Dr. Tonita Cong today.  Plan right shoulder mini-open revision RCR, SAD  Cheryl Kirstein M., PA-C for Dr. Tonita Cong 04/26/2016, 9:00 AM

## 2016-05-03 ENCOUNTER — Other Ambulatory Visit (HOSPITAL_COMMUNITY): Payer: Self-pay | Admitting: Emergency Medicine

## 2016-05-03 NOTE — Progress Notes (Signed)
HgA1C, CMP, CBC 04-17-16 epic LOV 10-04-15 Dr Suzy Bouchard epic ECHO 12-16-14 epic

## 2016-05-03 NOTE — Patient Instructions (Signed)
DIA DONATE  05/03/2016   Your procedure is scheduled on: 05-09-16  Report to [facility] Main  Entrance take [facility]  elevators to 3rd floor to  [place] at 815AM.  Call this number if you have problems the morning of surgery [phone]   Remember: ONLY 1 PERSON MAY GO WITH YOU TO SHORT STAY TO GET  READY MORNING OF [place].  Do not eat food or drink liquids :After Midnight.     Take these medicines the morning of surgery with A SIP OF WATER: amlodipine(norvasc), escitalopram(lexapro), esomeprazole(nexium), lamotrigine(lamictal), levothyroxine(synthroid)                                 You may not have any metal on your body including hair pins and              piercings  Do not wear jewelry, make-up, lotions, powders or perfumes, deodorant             Do not wear nail polish.  Do not shave  48 hours prior to surgery.              Men may shave face and neck.   Do not bring valuables to the hospital. [place].  Contacts, dentures or bridgework may not be worn into surgery.  Leave suitcase in the car. After surgery it may be brought to your room.                Please read over the following fact sheets you were given: _____________________________________________________________________             How to Manage Your Diabetes Before and After Surgery  Why is it important to control my blood sugar before and after surgery? . Improving blood sugar levels before and after surgery helps healing and can limit problems. . A way of improving blood sugar control is eating a healthy diet by: o  Eating less sugar and carbohydrates o  Increasing activity/exercise o  Talking with your doctor about reaching your blood sugar goals . High blood sugars (greater than 180 mg/dL) can raise your risk of infections and slow your recovery, so you will need to focus on controlling your diabetes during the  weeks before surgery. . Make sure that the doctor who takes care of your diabetes knows about your planned surgery including the date and location.  How do I manage my blood sugar before surgery? . Check your blood sugar at least 4 times a day, starting 2 days before surgery, to make sure that the level is not too high or low. o Check your blood sugar the morning of your surgery when you wake up and every 2 hours until you get to the Short Stay unit. . If your blood sugar is less than 70 mg/dL, you will need to treat for low blood sugar: o Do not take insulin. o Treat a low blood sugar (less than 70 mg/dL) with  cup of clear juice (cranberry or apple), 4 glucose tablets, OR glucose gel. o Recheck blood sugar in 15 minutes after treatment (to make sure it is greater than 70 mg/dL). If your blood sugar is not greater than 70 mg/dL on recheck, call  [phone] for further instructions. . Report your blood sugar to the short stay nurse when you get to Short Stay.  . If you are admitted to the hospital after surgery: o Your blood sugar will be checked by the staff and you will probably be given insulin after surgery (instead of oral diabetes medicines) to make sure you have good blood sugar levels. o The goal for blood sugar control after surgery is 80-180 mg/dL.    Patient Signature:  Date:   Nurse Signature:  Date:   Reviewed and Endorsed by [facility] Patient Education Committee, August 2015  [facility] - Preparing for Surgery Before surgery, you can play an important role.  Because skin is not sterile, your skin needs to be as free of germs as possible.  You can reduce the number of germs on your skin by washing with CHG (chlorahexidine gluconate) soap before surgery.  CHG is an antiseptic cleaner which kills germs and bonds with the skin to continue killing germs even after washing. Please DO NOT use if you have an allergy to CHG or antibacterial soaps.  If your skin becomes  reddened/irritated stop using the CHG and inform your nurse when you arrive at Short Stay. Do not shave (including legs and underarms) for at least 48 hours prior to the first CHG shower.  You may shave your face/neck. Please follow these instructions carefully:  1.  Shower with CHG Soap the night before surgery and the  morning of Surgery.  2.  If you choose to wash your hair, wash your hair first as usual with your  normal  shampoo.  3.  After you shampoo, rinse your hair and body thoroughly to remove the  shampoo.                           4.  Use CHG as you would any other liquid soap.  You can apply chg directly  to the skin and wash                       Gently with a scrungie or clean washcloth.  5.  Apply the CHG Soap to your body ONLY FROM THE NECK DOWN.   Do not use on face/ open                           Wound or open sores. Avoid contact with eyes, ears mouth and genitals (private parts).                       Wash face,  Genitals (private parts) with your normal soap.             6.  Wash thoroughly, paying special attention to the area where your surgery  will be performed.  7.  Thoroughly rinse your body with warm water from the neck down.  8.  DO NOT shower/wash with your normal soap after using and rinsing off  the CHG Soap.                9.  Pat yourself dry with a clean towel.            10.  Wear clean pajamas.            11.  Place clean sheets on your bed the night of your first shower and do not  sleep  with pets. Day of Surgery : Do not apply any lotions/deodorants the morning of surgery.  Please wear clean clothes to the hospital/surgery center.   Incentive Spirometer  An incentive spirometer is a tool that can help keep your lungs clear and active. This tool measures how well you are filling your lungs with each breath. Taking long deep breaths may help reverse or decrease the chance of developing breathing (pulmonary) problems (especially infection) following:  A  long period of time when you are unable to move or be active. BEFORE THE PROCEDURE   If the spirometer includes an indicator to show your best effort, your nurse or respiratory therapist will set it to a desired goal.  If possible, sit up straight or lean slightly forward. Try not to slouch.  Hold the incentive spirometer in an upright position. INSTRUCTIONS FOR USE  1. Sit on the edge of your bed if possible, or sit up as far as you can in bed or on a chair. 2. Hold the incentive spirometer in an upright position. 3. Breathe out normally. 4. Place the mouthpiece in your mouth and seal your lips tightly around it. 5. Breathe in slowly and as deeply as possible, raising the piston or the ball toward the top of the column. 6. Hold your breath for 3-5 seconds or for as long as possible. Allow the piston or ball to fall to the bottom of the column. 7. Remove the mouthpiece from your mouth and breathe out normally. 8. Rest for a few seconds and repeat Steps 1 through 7 at least 10 times every 1-2 hours when you are awake. Take your time and take a few normal breaths between deep breaths. 9. The spirometer may include an indicator to show your best effort. Use the indicator as a goal to work toward during each repetition. 10. After each set of 10 deep breaths, practice coughing to be sure your lungs are clear. If you have an incision (the cut made at the time of surgery), support your incision when coughing by placing a pillow or rolled up towels firmly against it. Once you are able to get out of bed, walk around indoors and cough well. You may stop using the incentive spirometer when instructed by your caregiver.  RISKS AND COMPLICATIONS  Take your time so you do not get dizzy or light-headed.  If you are in pain, you may need to take or ask for pain medication before doing incentive spirometry. It is harder to take a deep breath if you are having pain. AFTER USE  Rest and breathe slowly and  easily.  It can be helpful to keep track of a log of your progress. Your caregiver can provide you with a simple table to help with this. If you are using the spirometer at home, follow these instructions: [place] IF:   You are having difficultly using the spirometer.  You have trouble using the spirometer as often as instructed.  Your pain medication is not giving enough relief while using the spirometer.  You develop fever of 100.5 F (38.1 C) or higher. SEEK IMMEDIATE MEDICAL CARE IF:   You cough up bloody sputum that had not been present before.  You develop fever of 102 F (38.9 C) or greater.  You develop worsening pain at or near the incision site. MAKE SURE YOU:   Understand these instructions.  Will watch your condition.  Will get help right away if you are not doing well or get  worse. Document Released: 06/17/2006 Document Revised: 04/29/2011 Document Reviewed: 08/18/2006 [facility] Patient Information 2014 [place], [place].   ________________________________________________________________________

## 2016-05-06 ENCOUNTER — Encounter (HOSPITAL_COMMUNITY): Payer: Self-pay

## 2016-05-06 ENCOUNTER — Encounter (HOSPITAL_COMMUNITY)
Admission: RE | Admit: 2016-05-06 | Discharge: 2016-05-06 | Disposition: A | Discharge status: Home / Self Care | Payer: Medicare Other | Source: Ambulatory Visit | Attending: Specialist | Admitting: Specialist

## 2016-05-06 DIAGNOSIS — Z01810 Encounter for preprocedural cardiovascular examination: Secondary | ICD-10-CM | POA: Insufficient documentation

## 2016-05-06 DIAGNOSIS — I10 Essential (primary) hypertension: Secondary | ICD-10-CM | POA: Insufficient documentation

## 2016-05-06 DIAGNOSIS — Z01812 Encounter for preprocedural laboratory examination: Secondary | ICD-10-CM | POA: Diagnosis not present

## 2016-05-06 DIAGNOSIS — M75101 Unspecified rotator cuff tear or rupture of right shoulder, not specified as traumatic: Secondary | ICD-10-CM | POA: Insufficient documentation

## 2016-05-06 LAB — SURGICAL PCR SCREEN
MRSA, PCR: NEGATIVE
STAPHYLOCOCCUS AUREUS: NEGATIVE

## 2016-05-09 ENCOUNTER — Ambulatory Visit (HOSPITAL_COMMUNITY)
Admission: RE | Admit: 2016-05-09 | Discharge: 2016-05-10 | Disposition: A | Discharge status: Home / Self Care | Payer: Medicare Other | Source: Ambulatory Visit | Attending: Specialist | Admitting: Specialist

## 2016-05-09 ENCOUNTER — Ambulatory Visit (HOSPITAL_COMMUNITY): Payer: Medicare Other | Admitting: Anesthesiology

## 2016-05-09 ENCOUNTER — Encounter (HOSPITAL_COMMUNITY): Payer: Self-pay | Admitting: *Deleted

## 2016-05-09 ENCOUNTER — Encounter (HOSPITAL_COMMUNITY)
Admission: RE | Disposition: A | Discharge status: Home / Self Care | Payer: Self-pay | Source: Ambulatory Visit | Attending: Specialist

## 2016-05-09 DIAGNOSIS — Z79899 Other long term (current) drug therapy: Secondary | ICD-10-CM | POA: Insufficient documentation

## 2016-05-09 DIAGNOSIS — Z888 Allergy status to other drugs, medicaments and biological substances status: Secondary | ICD-10-CM | POA: Insufficient documentation

## 2016-05-09 DIAGNOSIS — M7501 Adhesive capsulitis of right shoulder: Secondary | ICD-10-CM | POA: Insufficient documentation

## 2016-05-09 DIAGNOSIS — K219 Gastro-esophageal reflux disease without esophagitis: Secondary | ICD-10-CM | POA: Diagnosis not present

## 2016-05-09 DIAGNOSIS — M75101 Unspecified rotator cuff tear or rupture of right shoulder, not specified as traumatic: Secondary | ICD-10-CM | POA: Diagnosis present

## 2016-05-09 DIAGNOSIS — I10 Essential (primary) hypertension: Secondary | ICD-10-CM | POA: Insufficient documentation

## 2016-05-09 DIAGNOSIS — G20 Parkinson's disease: Secondary | ICD-10-CM | POA: Diagnosis not present

## 2016-05-09 DIAGNOSIS — M7541 Impingement syndrome of right shoulder: Secondary | ICD-10-CM | POA: Diagnosis not present

## 2016-05-09 DIAGNOSIS — E119 Type 2 diabetes mellitus without complications: Secondary | ICD-10-CM | POA: Insufficient documentation

## 2016-05-09 DIAGNOSIS — H918X2 Other specified hearing loss, left ear: Secondary | ICD-10-CM | POA: Diagnosis not present

## 2016-05-09 DIAGNOSIS — Z90710 Acquired absence of both cervix and uterus: Secondary | ICD-10-CM | POA: Insufficient documentation

## 2016-05-09 DIAGNOSIS — E039 Hypothyroidism, unspecified: Secondary | ICD-10-CM | POA: Diagnosis not present

## 2016-05-09 DIAGNOSIS — Z885 Allergy status to narcotic agent status: Secondary | ICD-10-CM | POA: Diagnosis not present

## 2016-05-09 DIAGNOSIS — Z9889 Other specified postprocedural states: Secondary | ICD-10-CM | POA: Diagnosis not present

## 2016-05-09 DIAGNOSIS — Z833 Family history of diabetes mellitus: Secondary | ICD-10-CM | POA: Diagnosis not present

## 2016-05-09 DIAGNOSIS — M75120 Complete rotator cuff tear or rupture of unspecified shoulder, not specified as traumatic: Secondary | ICD-10-CM | POA: Diagnosis present

## 2016-05-09 DIAGNOSIS — Z823 Family history of stroke: Secondary | ICD-10-CM | POA: Insufficient documentation

## 2016-05-09 DIAGNOSIS — Z7982 Long term (current) use of aspirin: Secondary | ICD-10-CM | POA: Insufficient documentation

## 2016-05-09 HISTORY — PX: SHOULDER OPEN ROTATOR CUFF REPAIR: SHX2407

## 2016-05-09 LAB — GLUCOSE, CAPILLARY
GLUCOSE-CAPILLARY: 206 mg/dL — AB (ref 65–99)
Glucose-Capillary: 132 mg/dL — ABNORMAL HIGH (ref 65–99)

## 2016-05-09 SURGERY — REPAIR, ROTATOR CUFF, OPEN
Anesthesia: General | Laterality: Right

## 2016-05-09 MED ORDER — MIDAZOLAM HCL 2 MG/2ML IJ SOLN
1.0000 mg | INTRAMUSCULAR | Status: DC | PRN
  Administered 2016-05-09: 1 mg via INTRAVENOUS

## 2016-05-09 MED ORDER — METHOCARBAMOL 500 MG PO TABS
500.0000 mg | ORAL_TABLET | Freq: Four times a day (QID) | ORAL | Status: DC | PRN
  Administered 2016-05-09 – 2016-05-10 (×2): 500 mg via ORAL
  Filled 2016-05-09 (×2): qty 1

## 2016-05-09 MED ORDER — SUGAMMADEX SODIUM 200 MG/2ML IV SOLN
INTRAVENOUS | Status: DC | PRN
  Administered 2016-05-09: 200 mg via INTRAVENOUS

## 2016-05-09 MED ORDER — BISACODYL 5 MG PO TBEC: 5.0000 mg | DELAYED_RELEASE_TABLET | Freq: Every day | ORAL | Status: DC | PRN

## 2016-05-09 MED ORDER — PROPOFOL 10 MG/ML IV BOLUS
INTRAVENOUS | Status: AC
  Filled 2016-05-09: qty 20

## 2016-05-09 MED ORDER — CEFAZOLIN SODIUM-DEXTROSE 2-4 GM/100ML-% IV SOLN
2.0000 g | INTRAVENOUS | Status: AC
  Administered 2016-05-09: 2 g via INTRAVENOUS
  Filled 2016-05-09: qty 100

## 2016-05-09 MED ORDER — BUPIVACAINE-EPINEPHRINE (PF) 0.5% -1:200000 IJ SOLN
INTRAMUSCULAR | Status: AC
  Filled 2016-05-09: qty 30

## 2016-05-09 MED ORDER — FENTANYL CITRATE (PF) 100 MCG/2ML IJ SOLN
INTRAMUSCULAR | Status: AC
  Filled 2016-05-09: qty 2

## 2016-05-09 MED ORDER — PHENYLEPHRINE HCL 10 MG/ML IJ SOLN
INTRAVENOUS | Status: DC | PRN
  Administered 2016-05-09: 50 ug/min via INTRAVENOUS

## 2016-05-09 MED ORDER — RISAQUAD PO CAPS
1.0000 | ORAL_CAPSULE | Freq: Every day | ORAL | Status: DC
  Administered 2016-05-10: 1 via ORAL
  Filled 2016-05-09: qty 1

## 2016-05-09 MED ORDER — ONDANSETRON HCL 4 MG PO TABS: 4.0000 mg | ORAL_TABLET | Freq: Four times a day (QID) | ORAL | Status: DC | PRN

## 2016-05-09 MED ORDER — ALUM & MAG HYDROXIDE-SIMETH 200-200-20 MG/5ML PO SUSP: 30.0000 mL | ORAL | Status: DC | PRN

## 2016-05-09 MED ORDER — OXYCODONE HCL 5 MG PO TABS: 5.0000 mg | ORAL_TABLET | Freq: Once | ORAL | Status: DC | PRN

## 2016-05-09 MED ORDER — PANTOPRAZOLE SODIUM 40 MG PO TBEC
40.0000 mg | DELAYED_RELEASE_TABLET | Freq: Every day | ORAL | Status: DC
  Administered 2016-05-10: 40 mg via ORAL
  Filled 2016-05-09: qty 1

## 2016-05-09 MED ORDER — HYDROMORPHONE HCL 1 MG/ML IJ SOLN: 0.5000 mg | INTRAMUSCULAR | Status: DC | PRN

## 2016-05-09 MED ORDER — SUCCINYLCHOLINE CHLORIDE 200 MG/10ML IV SOSY
PREFILLED_SYRINGE | INTRAVENOUS | Status: AC
  Filled 2016-05-09: qty 10

## 2016-05-09 MED ORDER — MIDAZOLAM HCL 2 MG/2ML IJ SOLN
INTRAMUSCULAR | Status: AC
  Administered 2016-05-09: 1 mg via INTRAVENOUS
  Filled 2016-05-09: qty 2

## 2016-05-09 MED ORDER — AMLODIPINE BESYLATE 5 MG PO TABS
5.0000 mg | ORAL_TABLET | Freq: Every day | ORAL | Status: DC
  Administered 2016-05-10: 5 mg via ORAL
  Filled 2016-05-09: qty 1

## 2016-05-09 MED ORDER — FENTANYL CITRATE (PF) 100 MCG/2ML IJ SOLN
50.0000 ug | INTRAMUSCULAR | Status: DC | PRN
  Administered 2016-05-09: 50 ug via INTRAVENOUS

## 2016-05-09 MED ORDER — LACTATED RINGERS IV SOLN
INTRAVENOUS | Status: DC
  Administered 2016-05-09: 09:00:00 via INTRAVENOUS

## 2016-05-09 MED ORDER — POLYETHYLENE GLYCOL 3350 17 G PO PACK: 17.0000 g | PACK | Freq: Every day | ORAL | 0 refills | Status: DC

## 2016-05-09 MED ORDER — ACETAMINOPHEN 325 MG PO TABS
650.0000 mg | ORAL_TABLET | Freq: Four times a day (QID) | ORAL | Status: DC | PRN
  Administered 2016-05-09 – 2016-05-10 (×3): 650 mg via ORAL
  Filled 2016-05-09 (×3): qty 2

## 2016-05-09 MED ORDER — ONDANSETRON HCL 4 MG/2ML IJ SOLN
4.0000 mg | Freq: Four times a day (QID) | INTRAMUSCULAR | Status: DC | PRN
  Administered 2016-05-09: 4 mg via INTRAVENOUS
  Filled 2016-05-09: qty 2

## 2016-05-09 MED ORDER — DOCUSATE SODIUM 100 MG PO CAPS: 100.0000 mg | ORAL_CAPSULE | Freq: Two times a day (BID) | ORAL | 1 refills | Status: DC | PRN

## 2016-05-09 MED ORDER — ONDANSETRON HCL 4 MG/2ML IJ SOLN
INTRAMUSCULAR | Status: AC
  Filled 2016-05-09: qty 2

## 2016-05-09 MED ORDER — LIDOCAINE 2% (20 MG/ML) 5 ML SYRINGE
INTRAMUSCULAR | Status: AC
  Filled 2016-05-09: qty 5

## 2016-05-09 MED ORDER — BUPIVACAINE-EPINEPHRINE 0.5% -1:200000 IJ SOLN
INTRAMUSCULAR | Status: DC | PRN
  Administered 2016-05-09: 10 mL

## 2016-05-09 MED ORDER — METOCLOPRAMIDE HCL 5 MG PO TABS: 5.0000 mg | ORAL_TABLET | Freq: Three times a day (TID) | ORAL | Status: DC | PRN

## 2016-05-09 MED ORDER — LEVOTHYROXINE SODIUM 75 MCG PO TABS
75.0000 ug | ORAL_TABLET | Freq: Every day | ORAL | Status: DC
  Administered 2016-05-10: 75 ug via ORAL
  Filled 2016-05-09: qty 1

## 2016-05-09 MED ORDER — SODIUM CHLORIDE 0.9 % IR SOLN
Status: DC | PRN
  Administered 2016-05-09: 500 mL

## 2016-05-09 MED ORDER — METOCLOPRAMIDE HCL 5 MG/ML IJ SOLN: 5.0000 mg | Freq: Three times a day (TID) | INTRAMUSCULAR | Status: DC | PRN

## 2016-05-09 MED ORDER — DOCUSATE SODIUM 100 MG PO CAPS
100.0000 mg | ORAL_CAPSULE | Freq: Two times a day (BID) | ORAL | Status: DC
  Administered 2016-05-09 – 2016-05-10 (×2): 100 mg via ORAL
  Filled 2016-05-09 (×2): qty 1

## 2016-05-09 MED ORDER — KCL IN DEXTROSE-NACL 20-5-0.45 MEQ/L-%-% IV SOLN
INTRAVENOUS | Status: DC
  Administered 2016-05-09: 14:00:00 via INTRAVENOUS
  Filled 2016-05-09 (×2): qty 1000

## 2016-05-09 MED ORDER — ROCURONIUM BROMIDE 50 MG/5ML IV SOSY
PREFILLED_SYRINGE | INTRAVENOUS | Status: AC
  Filled 2016-05-09: qty 5

## 2016-05-09 MED ORDER — FENTANYL CITRATE (PF) 100 MCG/2ML IJ SOLN
INTRAMUSCULAR | Status: AC
  Administered 2016-05-09: 50 ug via INTRAVENOUS
  Filled 2016-05-09: qty 2

## 2016-05-09 MED ORDER — FENTANYL CITRATE (PF) 100 MCG/2ML IJ SOLN: 25.0000 ug | INTRAMUSCULAR | Status: DC | PRN

## 2016-05-09 MED ORDER — LIDOCAINE 2% (20 MG/ML) 5 ML SYRINGE
INTRAMUSCULAR | Status: DC | PRN
  Administered 2016-05-09: 50 mg via INTRAVENOUS

## 2016-05-09 MED ORDER — DEXAMETHASONE SODIUM PHOSPHATE 10 MG/ML IJ SOLN
INTRAMUSCULAR | Status: DC | PRN
  Administered 2016-05-09: 10 mg via INTRAVENOUS

## 2016-05-09 MED ORDER — EZETIMIBE-SIMVASTATIN 10-80 MG PO TABS
1.0000 | ORAL_TABLET | Freq: Every day | ORAL | Status: DC
  Administered 2016-05-09: 23:00:00 1 via ORAL
  Filled 2016-05-09: qty 1

## 2016-05-09 MED ORDER — ROCURONIUM BROMIDE 10 MG/ML (PF) SYRINGE
PREFILLED_SYRINGE | INTRAVENOUS | Status: DC | PRN
  Administered 2016-05-09: 30 mg via INTRAVENOUS

## 2016-05-09 MED ORDER — CHLORHEXIDINE GLUCONATE 4 % EX LIQD: 60.0000 mL | Freq: Once | CUTANEOUS | Status: DC

## 2016-05-09 MED ORDER — MENTHOL 3 MG MT LOZG: 1.0000 | LOZENGE | OROMUCOSAL | Status: DC | PRN

## 2016-05-09 MED ORDER — ONDANSETRON HCL 4 MG/2ML IJ SOLN: 4.0000 mg | Freq: Four times a day (QID) | INTRAMUSCULAR | Status: DC | PRN

## 2016-05-09 MED ORDER — SUCCINYLCHOLINE CHLORIDE 200 MG/10ML IV SOSY
PREFILLED_SYRINGE | INTRAVENOUS | Status: DC | PRN
  Administered 2016-05-09: 100 mg via INTRAVENOUS

## 2016-05-09 MED ORDER — OXYBUTYNIN CHLORIDE ER 5 MG PO TB24
10.0000 mg | ORAL_TABLET | Freq: Every day | ORAL | Status: DC
  Administered 2016-05-09 – 2016-05-10 (×2): 10 mg via ORAL
  Filled 2016-05-09 (×2): qty 2

## 2016-05-09 MED ORDER — SODIUM CHLORIDE 0.9 % IR SOLN
Status: AC
  Filled 2016-05-09: qty 500000

## 2016-05-09 MED ORDER — CEFAZOLIN SODIUM-DEXTROSE 2-4 GM/100ML-% IV SOLN
2.0000 g | Freq: Four times a day (QID) | INTRAVENOUS | Status: AC
  Administered 2016-05-09: 2 g via INTRAVENOUS
  Filled 2016-05-09: qty 100

## 2016-05-09 MED ORDER — DEXAMETHASONE SODIUM PHOSPHATE 10 MG/ML IJ SOLN
INTRAMUSCULAR | Status: AC
  Filled 2016-05-09: qty 1

## 2016-05-09 MED ORDER — ACETAMINOPHEN 650 MG RE SUPP: 650.0000 mg | Freq: Four times a day (QID) | RECTAL | Status: DC | PRN

## 2016-05-09 MED ORDER — OXYCODONE HCL 5 MG PO TABS
5.0000 mg | ORAL_TABLET | ORAL | Status: DC | PRN
  Filled 2016-05-09: qty 1

## 2016-05-09 MED ORDER — FENTANYL CITRATE (PF) 100 MCG/2ML IJ SOLN
INTRAMUSCULAR | Status: DC | PRN
  Administered 2016-05-09: 100 ug via INTRAVENOUS

## 2016-05-09 MED ORDER — ONDANSETRON HCL 4 MG/2ML IJ SOLN
INTRAMUSCULAR | Status: DC | PRN
  Administered 2016-05-09: 4 mg via INTRAVENOUS

## 2016-05-09 MED ORDER — CLONAZEPAM 0.5 MG PO TABS
0.5000 mg | ORAL_TABLET | Freq: Every day | ORAL | Status: DC
  Administered 2016-05-09: 23:00:00 0.5 mg via ORAL
  Filled 2016-05-09: qty 1

## 2016-05-09 MED ORDER — BUPIVACAINE-EPINEPHRINE (PF) 0.5% -1:200000 IJ SOLN
INTRAMUSCULAR | Status: DC | PRN
Start: 2016-05-09 — End: 2016-05-09
  Administered 2016-05-09: 30 mL via PERINEURAL

## 2016-05-09 MED ORDER — PHENOL 1.4 % MT LIQD
1.0000 | OROMUCOSAL | Status: DC | PRN
Start: 2016-05-09 — End: 2016-05-10

## 2016-05-09 MED ORDER — EZETIMIBE-SIMVASTATIN 10-80 MG PO TABS: 1.0000 | ORAL_TABLET | Freq: Every day | ORAL | Status: DC

## 2016-05-09 MED ORDER — PROPOFOL 10 MG/ML IV BOLUS
INTRAVENOUS | Status: DC | PRN
  Administered 2016-05-09: 150 mg via INTRAVENOUS

## 2016-05-09 MED ORDER — METHOCARBAMOL 1000 MG/10ML IJ SOLN: 500.0000 mg | Freq: Four times a day (QID) | INTRAVENOUS | Status: DC | PRN

## 2016-05-09 MED ORDER — DIPHENHYDRAMINE HCL 12.5 MG/5ML PO ELIX: 12.5000 mg | ORAL_SOLUTION | ORAL | Status: DC | PRN

## 2016-05-09 MED ORDER — OXYCODONE HCL 5 MG/5ML PO SOLN
5.0000 mg | Freq: Once | ORAL | Status: DC | PRN
  Filled 2016-05-09: qty 5

## 2016-05-09 MED ORDER — HYDROCODONE-ACETAMINOPHEN 5-325 MG PO TABS: 1.0000 | ORAL_TABLET | ORAL | 0 refills | Status: DC | PRN

## 2016-05-09 MED ORDER — ESCITALOPRAM OXALATE 10 MG PO TABS
10.0000 mg | ORAL_TABLET | Freq: Every day | ORAL | Status: DC
  Administered 2016-05-10: 10 mg via ORAL
  Filled 2016-05-09: qty 1

## 2016-05-09 MED ORDER — ASPIRIN EC 81 MG PO TBEC
81.0000 mg | DELAYED_RELEASE_TABLET | Freq: Every day | ORAL | Status: DC
  Administered 2016-05-09 – 2016-05-10 (×2): 81 mg via ORAL
  Filled 2016-05-09 (×2): qty 1

## 2016-05-09 MED ORDER — POLYETHYLENE GLYCOL 3350 17 G PO PACK: 17.0000 g | PACK | Freq: Every day | ORAL | Status: DC | PRN

## 2016-05-09 MED ORDER — MAGNESIUM CITRATE PO SOLN: 1.0000 | Freq: Once | ORAL | Status: DC | PRN

## 2016-05-09 MED ORDER — LAMOTRIGINE 100 MG PO TABS
100.0000 mg | ORAL_TABLET | Freq: Every morning | ORAL | Status: DC
  Administered 2016-05-10: 100 mg via ORAL
  Filled 2016-05-09: qty 1

## 2016-05-09 MED ORDER — KETOROLAC TROMETHAMINE 10 MG PO TABS: 10.0000 mg | ORAL_TABLET | Freq: Four times a day (QID) | ORAL | 0 refills | Status: DC | PRN

## 2016-05-09 MED ORDER — ASPIRIN EC 81 MG PO TBEC: 81.0000 mg | DELAYED_RELEASE_TABLET | Freq: Every day | ORAL | 1 refills | Status: DC

## 2016-05-09 SURGICAL SUPPLY — 43 items
ANCHOR NEEDLE 9/16 CIR SZ 8 (NEEDLE) IMPLANT
ANCHOR PEEK 4.75X19.1 SWLK C (Anchor) ×6 IMPLANT
BAG ZIPLOCK 12X15 (MISCELLANEOUS) IMPLANT
BLADE OSCILLATING/SAGITTAL (BLADE)
BLADE SW THK.38XMED LNG THN (BLADE) IMPLANT
CLOSURE WOUND 1/2 X4 (GAUZE/BANDAGES/DRESSINGS) ×1
CLOTH 2% CHLOROHEXIDINE 3PK (PERSONAL CARE ITEMS) ×3 IMPLANT
DRAPE POUCH INSTRU U-SHP 10X18 (DRAPES) ×3 IMPLANT
DRSG AQUACEL AG ADV 3.5X 4 (GAUZE/BANDAGES/DRESSINGS) ×3 IMPLANT
DRSG AQUACEL AG ADV 3.5X 6 (GAUZE/BANDAGES/DRESSINGS) IMPLANT
DURAPREP 26ML APPLICATOR (WOUND CARE) ×3 IMPLANT
ELECT NEEDLE TIP 2.8 STRL (NEEDLE) ×3 IMPLANT
ELECT REM PT RETURN 15FT ADLT (MISCELLANEOUS) ×3 IMPLANT
GLOVE BIOGEL PI IND STRL 7.0 (GLOVE) ×1 IMPLANT
GLOVE BIOGEL PI IND STRL 8 (GLOVE) ×1 IMPLANT
GLOVE BIOGEL PI INDICATOR 7.0 (GLOVE) ×2
GLOVE BIOGEL PI INDICATOR 8 (GLOVE) ×2
GLOVE SURG SS PI 7.0 STRL IVOR (GLOVE) ×3 IMPLANT
GLOVE SURG SS PI 7.5 STRL IVOR (GLOVE) ×3 IMPLANT
GLOVE SURG SS PI 8.0 STRL IVOR (GLOVE) ×3 IMPLANT
GOWN STRL REUS W/TWL XL LVL3 (GOWN DISPOSABLE) ×6 IMPLANT
KIT BASIN OR (CUSTOM PROCEDURE TRAY) ×3 IMPLANT
KIT POSITION SHOULDER SCHLEI (MISCELLANEOUS) ×3 IMPLANT
MANIFOLD NEPTUNE II (INSTRUMENTS) ×3 IMPLANT
MARKER SKIN DUAL TIP RULER LAB (MISCELLANEOUS) ×3 IMPLANT
NEEDLE SCORPION MULTI FIRE (NEEDLE) IMPLANT
PACK SHOULDER (CUSTOM PROCEDURE TRAY) ×3 IMPLANT
POSITIONER SURGICAL ARM (MISCELLANEOUS) IMPLANT
SLING ARM IMMOBILIZER LRG (SOFTGOODS) IMPLANT
SLING ULTRA II L (ORTHOPEDIC SUPPLIES) IMPLANT
STRIP CLOSURE SKIN 1/2X4 (GAUZE/BANDAGES/DRESSINGS) ×2 IMPLANT
SUT BONE WAX W31G (SUTURE) IMPLANT
SUT ETHIBOND NAB CT1 #1 30IN (SUTURE) IMPLANT
SUT FIBERWIRE #2 38 T-5 BLUE (SUTURE)
SUT PROLENE 3 0 PS 2 (SUTURE) ×3 IMPLANT
SUT TIGER TAPE 7 IN WHITE (SUTURE) ×3 IMPLANT
SUT VIC AB 1-0 CT2 27 (SUTURE) ×3 IMPLANT
SUT VIC AB 2-0 CT2 27 (SUTURE) ×3 IMPLANT
SUTURE FIBERWR #2 38 T-5 BLUE (SUTURE) IMPLANT
TAPE FIBER 2MM 7IN #2 BLUE (SUTURE) IMPLANT
TOWEL OR 17X26 10 PK STRL BLUE (TOWEL DISPOSABLE) ×3 IMPLANT
TOWEL OR NON WOVEN STRL DISP B (DISPOSABLE) ×3 IMPLANT
YANKAUER SUCT BULB TIP NO VENT (SUCTIONS) ×3 IMPLANT

## 2016-05-09 NOTE — Transfer of Care (Signed)
Immediate Anesthesia Transfer of Care Note  Patient: Cheryl [place]  Procedure(s) Performed: Procedure(s): Right shoulder mini open revision rotator cuff repair, subacromial decompression (Right)  Patient Location: PACU  Anesthesia Type:General  Level of Consciousness: awake and alert   Airway & Oxygen Therapy: Patient Spontanous Breathing and Patient connected to face mask oxygen  Post-op Assessment: Report given to RN and Post -op Vital signs reviewed and stable  Post vital signs: Reviewed and stable  Last Vitals:  Vitals:   05/09/16 0924 05/09/16 0926  BP:    Pulse: 70 73  Resp: 15 16  Temp:      Last Pain:  Vitals:   05/09/16 0855  TempSrc:   PainSc: 6       Patients Stated Pain Goal: 5 (78/67/67 2094)  Complications: No apparent anesthesia complications

## 2016-05-09 NOTE — Interval H&P Note (Signed)
History and Physical Interval Note:  05/09/2016 8:58 AM  Cheryl [place]  has presented today for surgery, with the diagnosis of Right shoulder recurrent rotator cuff tear  The various methods of treatment have been discussed with the patient and family. After consideration of risks, benefits and other options for treatment, the patient has consented to  Procedure(s): Right shoulder mini open revision rotator cuff repair, subacromial decompression (Right) as a surgical intervention .  The patient's history has been reviewed, patient examined, no change in status, stable for surgery.  I have reviewed the patient's chart and labs.  Questions were answered to the patient's satisfaction.     [name]

## 2016-05-09 NOTE — H&P (View-Only) (Signed)
Cheryl [facility] is an 76 y.o. female.   Chief Complaint: R shoulder pain HPI: The patient is a 76 year old female who presents today for follow up of their shoulder. The patient is being followed for their right shoulder pain. They are 1 1/2 month out from flare up. Symptoms reported today include: pain. and report their pain level to be 5 / 10. Current treatment includes: NSAIDs (Aleve prn). The following medication has been used for pain control: antiinflammatory medication (prn). The patient presents today following MRI. The patient has reported improvement of their symptoms with: Cortisone injections.  Allyn Kenner followup. Recheck right shoulder. She states that the injection did give her some relief. She is still having pain with certain motions and positions. She is hurting, but is considering if she needs surgery may be putting that off a little while there is some question of if her husband will need upcoming surgery on his neck or his back. She is otherwise taking just Aleve as needed. Still doing water aerobics, but she has been able to modify, avoid lifting that arm up overhead.  Past Medical History:  Diagnosis Date  . Deafness in left ear   . Diabetes mellitus    diet controlled  . GERD (gastroesophageal reflux disease)   . Hypertension   . Hypothyroid   . PONV (postoperative nausea and vomiting) yrs ago, none recent    Past Surgical History:  Procedure Laterality Date  . ABDOMINAL HYSTERECTOMY  1986  . BACK SURGERY  2010   lower  . left elobow surgery  2003  . left rotator cuff  2001  . r foot surgery  1995  . right hand surgery  2001  . right index finger surgery  2009  . SHOULDER OPEN ROTATOR CUFF REPAIR  11/21/2011   Procedure: ROTATOR CUFF REPAIR SHOULDER OPEN;  Surgeon: Johnn Hai, MD;  Location: [facility]S;  Service: Orthopedics;  Laterality: Right;  with subacromial decompression    Family History  Problem Relation Age of Onset  . Stroke Father   . Stroke  Sister   . Stroke Brother   . Diabetes Brother   . Stroke Sister     TIA   Social History:  reports that she has never smoked. She has never used smokeless tobacco. She reports that she drinks alcohol. She reports that she does not use drugs.  Allergies:  Allergies  Allergen Reactions  . Atorvastatin Other (See Comments)    Unbalanced  . Chlordiazepoxide-Clidinium     Other reaction(s): Dizziness (intolerance)  . Codeine Nausea And Vomiting  . Other Nausea And Vomiting    Clidinium & chlordiazepoxide (Librax), dizziness     (Not in a hospital admission)  No results found for this or any previous visit (from the past 48 hour(s)). No results found.  Review of Systems  Constitutional: Negative.   HENT: Negative.   Eyes: Negative.   Respiratory: Negative.   Cardiovascular: Negative.   Gastrointestinal: Negative.   Genitourinary: Negative.   Musculoskeletal: Positive for joint pain.  Skin: Negative.   Neurological: Negative.     There were no vitals taken for this visit. Physical Exam  Constitutional: She is oriented to person, place, and time. She appears well-developed.  HENT:  Head: Normocephalic.  Eyes: Pupils are equal, round, and reactive to light.  Neck: Normal range of motion.  Cardiovascular: Normal rate.   Respiratory: Effort normal.  GI: Soft.  Musculoskeletal:  On exam, she is well nourished, well developed,  awake, alert, and oriented x3, in no acute distress. Mildly tender subacromial space. Positive impingement and secondary impingement testing. Mildly decreased internal rotation. Full forward flexion. No instability. No weakness with upper extremity strength testing, abduction, biceps, triceps, internal and external rotation 5/5.  Neurological: She is alert and oriented to person, place, and time.  Skin: Skin is warm and dry.    MRI images and report reviewed today by Dr. Tonita Cong, retained hardware from prior rotator cuff repair. There is a recurrent full  thickness, full width tear of the supraspinatus tendon at the greater tuberosity with approximately 3.5 cm of retraction of the tendon. There is no associated atrophy. Some mild diffuse subscap and infraspinatus tendinosis, some degenerative changes in the glenohumeral joint including some labral degeneration. Mild thickening of the coracoacromial ligament.  Assessment/Plan Right shoulder recurrent rotator cuff tear.  Discussed relevant anatomy, reviewed her MRI images in detail. She does have a full thickness tear retracted 3.5 cm. There is no associated atrophy. We discussed possible rotator cuff repair. She would like to see if her husband is going to require surgery first, so they can work on the timing of that. I urged her that she does not want to delay this surgery too long, so as to avoid atrophy. If this would be unrepairable then that could lead to an eventual total shoulder replacement. She is aware of that. We discussed the procedure itself as well as risks, complications and alternatives including but not limited to DVT, PE, failure of procedure, need for secondary procedure, anesthesia risk, even death. We will proceed with scheduling appropriately. She does need clearance from her PCP, Dr. Suzy Bouchard. After we obtain that we will proceed with scheduling her for right shoulder mini open revision rotator cuff repair and subacromial decompression. She is still experiencing some relief from the injection. Discussed continued activity modifications in the interim and other postop protocols. The patient was seen in conjunction with Dr. Tonita Cong today.  Plan right shoulder mini-open revision RCR, SAD  BISSELL, JACLYN M., PA-C for Dr. Tonita Cong 04/26/2016, 9:00 AM

## 2016-05-09 NOTE — Anesthesia Procedure Notes (Signed)
Anesthesia Regional Block: Interscalene brachial plexus block   Pre-Anesthetic Checklist: ,, timeout performed, Correct Patient, Correct Site, Correct Laterality, Correct Procedure, Correct Position, site marked, Risks and benefits discussed,  Surgical consent,  Pre-op evaluation,  At surgeon's request and post-op pain management  Laterality: Right  Prep: chloraprep       Needles:  Injection technique: Single-shot  Needle Type: Echogenic Stimulator Needle     Needle Length: 5cm  Needle Gauge: 22     Additional Needles:   Procedures: ultrasound guided, nerve stimulator,,,,,,   Nerve Stimulator or Paresthesia:  Response: biceps flexion, 0.45 mA,   Additional Responses:   Narrative:  Start time: 05/09/2016 9:08 AM End time: 05/09/2016 9:14 AM Injection made incrementally with aspirations every 5 mL.  Performed by: Personally  Anesthesiologist: Analucia Hush  Additional Notes: Functioning IV was confirmed and monitors were applied.  A 35mm 22ga Arrow echogenic stimulator needle was used. Sterile prep and drape,hand hygiene and sterile gloves were used.  Negative aspiration and negative test dose prior to incremental administration of local anesthetic. The patient tolerated the procedure well.  Ultrasound guidance: relevent anatomy identified, needle position confirmed, local anesthetic spread visualized around nerve(s), vascular puncture avoided.  Image printed for medical record.

## 2016-05-09 NOTE — Brief Op Note (Signed)
05/09/2016  10:42 AM  PATIENT:  Cheryl [facility]  76 y.o. female  PRE-OPERATIVE DIAGNOSIS:  rotator cuff tear right shoulder  POST-OPERATIVE DIAGNOSIS:  rotator cuff tear right shoulder  PROCEDURE:  Procedure(s): Right shoulder mini open revision rotator cuff repair, subacromial decompression (Right)  SURGEON:  Surgeon(s) and Role:    * Susa Day, MD - Primary  PHYSICIAN ASSISTANT:   ASSISTANTS: [place]   ANESTHESIA:   general  EBL:  No intake/output data recorded.  BLOOD ADMINISTERED:none  DRAINS: none   LOCAL MEDICATIONS USED:  MARCAINE     SPECIMEN:  No Specimen  DISPOSITION OF SPECIMEN:  N/A  COUNTS:  YES  TOURNIQUET:  * No tourniquets in log *  DICTATION: .Other Dictation: Dictation Number [id number]  PLAN OF CARE: Admit for overnight observation  PATIENT DISPOSITION:  PACU - hemodynamically stable.   Delay start of Pharmacological VTE agent (>24hrs) due to surgical blood loss or risk of bleeding: no

## 2016-05-09 NOTE — Progress Notes (Signed)
Assisted Dr. Hodierne with right, ultrasound guided, interscalene  block. Side rails up, monitors on throughout procedure. See vital signs in flow sheet. Tolerated Procedure well. 

## 2016-05-09 NOTE — Discharge Instructions (Signed)

## 2016-05-09 NOTE — Anesthesia Procedure Notes (Signed)
Procedure Name: Intubation Date/Time: 05/09/2016 9:45 AM Performed by: Danley Danker L Patient Re-evaluated:Patient Re-evaluated prior to inductionOxygen Delivery Method: Circle system utilized Preoxygenation: Pre-oxygenation with 100% oxygen Intubation Type: IV induction Ventilation: Mask ventilation without difficulty Laryngoscope Size: Miller and 2 Grade View: Grade II Tube type: Oral Tube size: 7.5 mm Number of attempts: 1 Airway Equipment and Method: Stylet Placement Confirmation: ETT inserted through vocal cords under direct vision,  positive ETCO2 and breath sounds checked- equal and bilateral Secured at: 21 cm Tube secured with: Tape Dental Injury: Teeth and Oropharynx as per pre-operative assessment

## 2016-05-09 NOTE — Anesthesia Preprocedure Evaluation (Signed)
Anesthesia Evaluation  Patient identified by MRN, date of birth, ID band Patient awake    Reviewed: Allergy & Precautions, H&P , NPO status , Patient's Chart, lab work & pertinent test results  History of Anesthesia Complications (+) PONV and history of anesthetic complications  Airway Mallampati: II   Neck ROM: full    Dental   Pulmonary neg pulmonary ROS,    breath sounds clear to auscultation       Cardiovascular hypertension,  Rhythm:regular Rate:Normal     Neuro/Psych Parkinson's dz CVA    GI/Hepatic GERD  ,  Endo/Other  diabetes, Type 2Hypothyroidism   Renal/GU      Musculoskeletal   Abdominal   Peds  Hematology   Anesthesia Other Findings   Reproductive/Obstetrics                             Anesthesia Physical Anesthesia Plan  ASA: III  Anesthesia Plan: General   Post-op Pain Management: GA combined w/ Regional for post-op pain   Induction: Intravenous  Airway Management Planned: Oral ETT  Additional Equipment:   Intra-op Plan:   Post-operative Plan: Extubation in OR  Informed Consent: I have reviewed the patients History and Physical, chart, labs and discussed the procedure including the risks, benefits and alternatives for the proposed anesthesia with the patient or authorized representative who has indicated his/her understanding and acceptance.     Plan Discussed with: CRNA, Anesthesiologist and Surgeon  Anesthesia Plan Comments:         Anesthesia Quick Evaluation

## 2016-05-09 NOTE — Anesthesia Postprocedure Evaluation (Addendum)
Anesthesia Post Note  Patient: Cheryl [place]  Procedure(s) Performed: Procedure(s) (LRB): Right shoulder mini open revision rotator cuff repair, subacromial decompression (Right)  Patient location during evaluation: PACU Anesthesia Type: General Level of consciousness: awake and alert and patient cooperative Pain management: pain level controlled Vital Signs Assessment: post-procedure vital signs reviewed and stable Respiratory status: spontaneous breathing and respiratory function stable Cardiovascular status: stable Anesthetic complications: no       Last Vitals:  Vitals:   05/09/16 1230 05/09/16 1337  BP: (!) 148/64 (!) 162/74  Pulse: 64 68  Resp: 16 16  Temp: 36.5 C 36.6 C    Last Pain:  Vitals:   05/09/16 1337  TempSrc: Oral  PainSc:                  [place] S

## 2016-05-10 ENCOUNTER — Encounter (HOSPITAL_COMMUNITY): Payer: Self-pay

## 2016-05-10 DIAGNOSIS — M75101 Unspecified rotator cuff tear or rupture of right shoulder, not specified as traumatic: Secondary | ICD-10-CM | POA: Diagnosis not present

## 2016-05-10 LAB — BASIC METABOLIC PANEL
Anion gap: 8 (ref 5–15)
BUN: 11 mg/dL (ref 6–20)
CO2: 24 mmol/L (ref 22–32)
Calcium: 9.4 mg/dL (ref 8.9–10.3)
Chloride: 105 mmol/L (ref 101–111)
Creatinine, Ser: 0.91 mg/dL (ref 0.44–1.00)
GFR calc Af Amer: >60 mL/min (ref >60)
GLUCOSE: 183 mg/dL — AB (ref 65–99)
POTASSIUM: 4.3 mmol/L (ref 3.5–5.1)
Sodium: 137 mmol/L (ref 135–145)

## 2016-05-10 NOTE — Progress Notes (Signed)
RN reviewed discharge instructions with patient and family. All questions answered.   Paperwork and prescriptions given.   NT rolled patient down with all belongings to family car. 

## 2016-05-10 NOTE — Progress Notes (Signed)
Subjective: 1 Day Post-Op Procedure(s) (LRB): Right shoulder mini open revision rotator cuff repair, subacromial decompression (Right) Patient reports pain as mild.   Reports block has worn off, denies numbness or tingling Denies CP, SOB, N/V. Is off O2 this AM does not appear SOB. Not tachycardic. States she feels like she has extra phlegm in her throat which was occurring prior to surgery as she has had a cold the last week. She has been using her incentive spirometer.  Objective: Vital signs in last 24 hours: Temp:  [97.2 F (36.2 C)-98.3 F (36.8 C)] 98.3 F (36.8 C) (03/23 0446) Pulse Rate:  [60-75] 75 (03/23 0446) Resp:  [12-21] 18 (03/23 0446) BP: (107-162)/(57-77) 159/71 (03/23 0446) SpO2:  [91 %-100 %] 94 % (03/23 0446)  Intake/Output from previous day: 03/22 0701 - 03/23 0700 In: 2435.8 [P.O.:840; I.V.:1495.8; IV Piggyback:100] Out: 2775 [Urine:2750; Blood:25] Intake/Output this shift: Total I/O In: 457.5 [P.O.:120; I.V.:337.5] Out: 300 [Urine:300]  No results for input(s): HGB in the last 72 hours. No results for input(s): WBC, RBC, HCT, PLT in the last 72 hours.  Recent Labs  05/10/16 0430  NA 137  K 4.3  CL 105  CO2 24  BUN 11  CREATININE 0.91  GLUCOSE 183*  CALCIUM 9.4   No results for input(s): LABPT, INR in the last 72 hours.  Neurologically intact ABD soft Neurovascular intact Sensation intact distally Intact pulses distally Dorsiflexion/Plantar flexion intact Incision: dressing C/D/I and no drainage No cellulitis present Compartment soft no calf pain or sign of DVT  Assessment/Plan: 1 Day Post-Op Procedure(s) (LRB): Right shoulder mini open revision rotator cuff repair, subacromial decompression (Right) Advance diet Up with therapy D/C IV fluids  Continue incentive spirometry PT/OT to see today per protocol Plan D/C today as long as O2 Sat is stable on RA Discussed with Dr. Mliss Fritz, [place] Rolls. 05/10/2016, 8:45 AM

## 2016-05-10 NOTE — Discharge Summary (Signed)
Patient ID: Cheryl Cooper MRN: [id number] DOB/AGE: [birth year] 76 y.o.  Admit date: 05/09/2016 Discharge date: 05/10/2016  Admission Diagnoses:  Principal Problem:   Rotator cuff tear, right Active Problems:   Complete rotator cuff tear   Discharge Diagnoses:  Same  Past Medical History:  Diagnosis Date  . Deafness in right ear   . Diabetes mellitus    diet controlled  . GERD (gastroesophageal reflux disease)   . Hypertension   . Hypothyroid   . Parkinson's disease [facility])    "my doctor says i have a form of parkinsons the middle one that affects you balance"  . PONV (postoperative nausea and vomiting) yrs ago, none recent    Surgeries: Procedure(s): Right shoulder mini open revision rotator cuff repair, subacromial decompression on 05/09/2016   Consultants:   Discharged Condition: Improved  Hospital Course: CAYLE CORDOBA is an 76 y.o. female who was admitted 05/09/2016 for operative treatment ofRotator cuff tear, right. Patient has severe unremitting pain that affects sleep, daily activities, and work/hobbies. After pre-op clearance the patient was taken to the operating room on 05/09/2016 and underwent  Procedure(s): Right shoulder mini open revision rotator cuff repair, subacromial decompression.    Patient was given perioperative antibiotics: Anti-infectives    Start     Dose/Rate Route Frequency Ordered Stop   05/09/16 1600  ceFAZolin (ANCEF) IVPB 2g/100 mL premix     2 g 200 mL/hr over 30 Minutes Intravenous Every 6 hours 05/09/16 1301 05/09/16 1703   05/09/16 1004  polymyxin B 500,000 Units, bacitracin 50,000 Units in sodium chloride irrigation 0.9 % 500 mL irrigation  Status:  Discontinued       As needed 05/09/16 1020 05/09/16 1052   05/09/16 0815  ceFAZolin (ANCEF) IVPB 2g/100 mL premix     2 g 200 mL/hr over 30 Minutes Intravenous On call to O.R. 05/09/16 3149 05/09/16 0950       Patient was given sequential compression devices, early ambulation, and  chemoprophylaxis to prevent DVT.  Patient benefited maximally from hospital stay and there were no complications.    Recent vital signs: Patient Vitals for the past 24 hrs:  BP Temp Temp src Pulse Resp SpO2  05/10/16 0900 138/65 - - 68 16 91 %  05/10/16 0446 (!) 159/71 98.3 F (36.8 C) Oral 75 18 94 %  05/10/16 0155 (!) 147/69 97.9 F (36.6 C) Oral 70 18 92 %  05/09/16 2222 (!) 146/63 97.6 F (36.4 C) Oral 75 16 93 %  05/09/16 1730 (!) 150/64 97.7 F (36.5 C) Oral 69 17 91 %  05/09/16 1526 (!) 152/61 98 F (36.7 C) Oral 73 16 93 %  05/09/16 1431 140/63 97.9 F (36.6 C) Oral 68 16 92 %  05/09/16 1337 (!) 162/74 97.9 F (36.6 C) Oral 68 16 92 %  05/09/16 1230 (!) 148/64 97.7 F (36.5 C) - 64 16 93 %  05/09/16 1215 112/65 97.2 F (36.2 C) - 70 14 93 %  05/09/16 1200 (!) 116/57 - - 70 12 92 %  05/09/16 1145 (!) 107/59 - - 65 17 93 %  05/09/16 1130 115/66 - - 69 17 93 %  05/09/16 1115 120/66 - - 69 12 97 %  05/09/16 1100 (!) 115/58 - - 71 18 94 %  05/09/16 1056 (!) 116/58 97.7 F (36.5 C) - 71 14 94 %     Recent laboratory studies:  Recent Labs  05/10/16 0430  NA 137  K 4.3  CL 105  CO2 24  BUN 11  CREATININE 0.91  GLUCOSE 183*  CALCIUM 9.4     Discharge Medications:   Allergies as of 05/10/2016      Reactions   Atorvastatin Other (See Comments)   Unbalanced   Chlordiazepoxide-clidinium    Dizziness (intolerance)   Codeine Nausea Only   Other    Tomato sauce - severe acid reflux       Medication List    STOP taking these medications   aspirin 81 MG tablet Replaced by:  aspirin EC 81 MG tablet     TAKE these medications   amLODipine 5 MG tablet Commonly known as:  NORVASC Take 5 mg by mouth daily.   aspirin EC 81 MG tablet Take 1 tablet (81 mg total) by mouth daily. Replaces:  aspirin 81 MG tablet   azithromycin 250 MG tablet Commonly known as:  ZITHROMAX Take 250-500 mg by mouth as directed. Take 500mg s on day 1 then take 250mg s daily for the  next 4 days   CALCIUM 600+D PO Take 1 tablet by mouth daily.   clonazePAM 0.5 MG tablet Commonly known as:  KLONOPIN Take 0.5 mg by mouth at bedtime.   CORICIDIN HBP CONGESTION/COUGH PO Take 1 tablet by mouth 2 (two) times daily as needed (congestion).   docusate sodium 100 MG capsule Commonly known as:  COLACE Take 1 capsule (100 mg total) by mouth 2 (two) times daily as needed for mild constipation.   escitalopram 10 MG tablet Commonly known as:  LEXAPRO Take 10 mg by mouth daily.   esomeprazole 40 MG capsule Commonly known as:  NEXIUM Take 40 mg by mouth 2 (two) times daily.   ezetimibe-simvastatin 10-80 MG tablet Commonly known as:  VYTORIN Take 1 tablet by mouth at bedtime.   HYDROcodone-acetaminophen 5-325 MG tablet Commonly known as:  NORCO/VICODIN Take 1-2 tablets by mouth every 4 (four) hours as needed for severe pain.   ketorolac 10 MG tablet Commonly known as:  TORADOL Take 1 tablet (10 mg total) by mouth every 6 (six) hours as needed. Do not take with other anti-inflammatories   lamoTRIgine 100 MG tablet Commonly known as:  LAMICTAL Take 100 mg by mouth every morning.   levothyroxine 75 MCG tablet Commonly known as:  SYNTHROID, LEVOTHROID Take 75 mcg by mouth daily with breakfast.   multivitamin tablet Take 2 tablets by mouth daily.   oxybutynin 10 MG 24 hr tablet Commonly known as:  DITROPAN-XL Take 10 mg by mouth daily.   polyethylene glycol packet Commonly known as:  MIRALAX / GLYCOLAX Take 17 g by mouth daily.   SUPER B COMPLEX/C PO Take 1 tablet by mouth daily.   Vitamin D3 2000 units capsule Take 2,000 Units by mouth daily.       Diagnostic Studies: No results found.  Disposition: 01-Home or Self Care  Discharge Instructions    Call MD / Call 911    Complete by:  As directed    If you experience chest pain or shortness of breath, CALL 911 and be transported to the hospital emergency room.  If you develope a fever above 101 F,  pus (white drainage) or increased drainage or redness at the wound, or calf pain, call your surgeon's office.   Constipation Prevention    Complete by:  As directed    Drink plenty of fluids.  Prune juice may be helpful.  You may use a stool softener, such as Colace (over the counter) 100 mg twice a day.  Use MiraLax (over the counter) for constipation as needed.   Diet - low sodium heart healthy    Complete by:  As directed    Increase activity slowly as tolerated    Complete by:  As directed       Follow-up Information    BEANE,JEFFREY C, MD Follow up in 2 week(s).   Specialty:  Orthopedic Surgery Contact information: [address] [place] [address] [phone]            Signed: Cecilie Kicks. 05/10/2016, 9:34 AM

## 2016-05-10 NOTE — Evaluation (Signed)
Occupational Therapy Evaluation Patient Details Name: YUMALAY CIRCLE MRN: [id number] DOB: April 09, 1940 Today's Date: 05/10/2016    History of Present Illness 76 year old female admitted for revision R RCR and SAD.  PMH:  Parkinson's disease, DM, deaf L ear   Clinical Impression   Pt was admitted for the above.  She had RCR in '13. She is very unsteady and has h/o Parkinson's disease, but has been mod I with ADLs prior to sx. Will see again prior to d/c to perform pendulum exercises and reinforce education with pt/husband.  He was not present during evaluation    Follow Up Recommendations  Other (comment) (pt will follow up with Dr Tonita Cong)    Equipment Recommendations  None recommended by OT    Recommendations for Other Services       Precautions / Restrictions Precautions Precautions: Fall;Shoulder Type of Shoulder Precautions: passive protocol. Sling except bathing/dressing/pendulum exercises Shoulder Interventions: Shoulder sling/immobilizer Precaution Booklet Issued: Yes (comment) Required Braces or Orthoses: Sling Restrictions Weight Bearing Restrictions: Yes Other Position/Activity Restrictions: NWB      Mobility Bed Mobility          supervision, HOB raised        Transfers          sit to stand:  Min A, hand held assistance            Balance Overall balance assessment: History of Falls                                         ADL either performed or assessed with clinical judgement   ADL Overall ADL's : Needs assistance/impaired Eating/Feeding: Independent   Grooming: Set up;Sitting   Upper Body Bathing: Moderate assistance;Sitting   Lower Body Bathing: Sit to/from stand;Minimal assistance   Upper Body Dressing : Maximal assistance;Sitting   Lower Body Dressing: Maximal assistance;Sit to/from stand   Toilet Transfer: Minimal assistance;Ambulation;Comfort height toilet   Toileting- Clothing Manipulation and Hygiene:  Minimal assistance;Sit to/from stand         General ADL Comments: performed ADl and educated on shoulder precautions. Pt has a tendency to move her R shoulder actively. Cued not to do this and immobilizer strap placed around pt's waist.  She has Parkinson's Disease and has had several falls.       Vision         Perception     Praxis      Pertinent Vitals/Pain Pain Assessment: Faces Pain Score: 4  Pain Location: R shoulder Pain Descriptors / Indicators: Aching Pain Intervention(s): Limited activity within patient's tolerance;Monitored during session;Premedicated before session;Repositioned     Hand Dominance     Extremity/Trunk Assessment             Communication Communication Communication:  (deaf in L ear)   Cognition Arousal/Alertness: Awake/alert Behavior During Therapy: WFL for tasks assessed/performed Overall Cognitive Status: Within Functional Limits for tasks assessed                                     General Comments       Exercises     Shoulder Instructions      Home Living Family/patient expects to be discharged to:: Private residence Living Arrangements: Spouse/significant other   Type of Home: House  Bathroom Shower/Tub: Occupational psychologist: Standard     Home Equipment: Bedside commode;Shower seat          Prior Functioning/Environment Level of Independence: Independent                 OT Problem List: Impaired balance (sitting and/or standing);Impaired UE functional use;Decreased knowledge of precautions      OT Treatment/Interventions: Self-care/ADL training;Therapeutic exercise;Patient/family education    OT Goals(Current goals can be found in the care plan section) Acute Rehab OT Goals Patient Stated Goal: good recovery OT Goal Formulation: With patient Time For Goal Achievement: 05/17/16 Potential to Achieve Goals: Good ADL Goals Additional ADL Goal #1: husband will  demonstrate vs verbalize donning of sling and precautions Additional ADL Goal #2: pt will perform pendulum exercises with supervision (passively)  OT Frequency: Min 1X/week   Barriers to D/C:            Co-evaluation              End of Session Nurse Communication: Mobility status;Other (comment) (nausea)  Activity Tolerance: Patient tolerated treatment well Patient left: in bed;with call bell/phone within reach;with bed alarm set  OT Visit Diagnosis: Unsteadiness on feet (R26.81);Pain Pain - Right/Left: Right Pain - part of body: Shoulder                Time: 0865-7846 OT Time Calculation (min): 25 min Charges:  OT General Charges $OT Visit: 1 Procedure OT Evaluation $OT Eval Moderate Complexity: 1 Procedure OT Treatments $Self Care/Home Management : 8-22 mins G-Codes: OT G-codes **NOT FOR INPATIENT CLASS** Functional Assessment Tool Used: Clinical judgement;AM-PAC 6 Clicks Daily Activity Functional Limitation: Self care Self Care Current Status (N6295): At least 40 percent but less than 60 percent impaired, limited or restricted Self Care Goal Status (M8413): At least 40 percent but less than 60 percent impaired, limited or restricted   Lesle Chris, OTR/L [phone] 05/10/2016  Keeton Kassebaum 05/10/2016, 10:37 AM

## 2016-05-10 NOTE — Progress Notes (Signed)
   05/10/16 1200  OT Visit Information  Last OT Received On 05/10/16  Assistance Needed +1  History of Present Illness 76 year old female admitted for revision R RCR and SAD.  PMH:  Parkinson's disease, DM, deaf L ear  Precautions  Precautions Fall;Shoulder  Type of Shoulder Precautions passive protocol. Sling except bathing/dressing/pendulum exercises  Shoulder Interventions Shoulder sling/immobilizer  Precaution Booklet Issued Yes (comment)  Required Braces or Orthoses Sling  Pain Assessment  Pain Score 4  Pain Location R shoulder  Pain Descriptors / Indicators Aching  Pain Intervention(s) Limited activity within patient's tolerance;Monitored during session;Premedicated before session;Repositioned  Cognition  Arousal/Alertness Awake/alert  Behavior During Therapy WFL for tasks assessed/performed  Overall Cognitive Status Within Functional Limits for tasks assessed  ADL  General ADL Comments husband present.  Showed him donning a shirt and donning/doffing sling.  He verbalizes understanding and comfort with this, as well as adjusting back part of shoulder strap if pt does not have enough support.  She was shrugging shoulder up, and we did this.  Attempted pendulums, but pt had poor balance and she was not getting movement in shoulder passively.  Educated to skip these for now.  Educated on elbow to finger AROM  Bed Mobility  General bed mobility comments supervision, HOB raised  Restrictions  Other Position/Activity Restrictions NWB  Transfers  Equipment used 1 person hand held assist  General transfer comment min A sit to stand  OT - End of Session  Activity Tolerance Patient tolerated treatment well  Patient left in bed;with call bell/phone within reach;with bed alarm set;with family/visitor present  OT Assessment/Plan  OT Visit Diagnosis Unsteadiness on feet (R26.81);Pain  Pain - Right/Left Right  Pain - part of body Shoulder  Follow Up Recommendations (pt to follow up with Dr  Tonita Cong)  OT Equipment None recommended by OT (has BSC)  AM-PAC OT "6 Clicks" Daily Activity Outcome Measure  Help from another person eating meals? 4  Help from another person taking care of personal grooming? 3  Help from another person toileting, which includes using toliet, bedpan, or urinal? 3  Help from another person bathing (including washing, rinsing, drying)? 3  Help from another person to put on and taking off regular upper body clothing? 2  Help from another person to put on and taking off regular lower body clothing? 2  6 Click Score 17  ADL G Code Conversion CK  OT Goal Progression  Progress towards OT goals Goals met/education completed, patient discharged from OT (discontinued pendulums for safety)  OT Time Calculation  OT Start Time (ACUTE ONLY) 1122  OT Stop Time (ACUTE ONLY) 1146  OT Time Calculation (min) 24 min  OT G-codes **NOT FOR INPATIENT CLASS**  Self Care Discharge Status (V9166) CK  OT General Charges  $OT Visit 1 Procedure  OT Treatments  $Self Care/Home Management  8-22 mins  Lesle Chris, OTR/L [phone] 05/10/2016

## 2016-05-10 NOTE — Op Note (Signed)
NAME:  Cheryl Cooper, Cheryl Cooper                    ACCOUNT NO.:  MEDICAL RECORD NO.:  [id number]  LOCATION:                                 FACILITY:  PHYSICIAN:  Susa Day, M.D.         DATE OF BIRTH:  DATE OF PROCEDURE:  05/09/2016 DATE OF DISCHARGE:                              OPERATIVE REPORT   PREOPERATIVE DIAGNOSES: 1. Recurrent rotator cuff tear, right shoulder. 2. Impingement syndrome, right shoulder.  POSTOPERATIVE DIAGNOSES: 1. Recurrent rotator cuff tear, right shoulder. 2. Impingement syndrome, right shoulder.  PROCEDURE PERFORMED: 1. Revision of right rotator cuff repair. 2. Subacromial decompression. 3. Bursectomy.  ANESTHESIA:  General.  ASSISTANT:  Cleophas Dunker, PA.  HISTORY:  This is a 76 year old with recurrent pain in the shoulder, noted was a recurrent tear with retraction, which is indicative for repair.  Risks and benefits were discussed including bleeding, infection, suboptimal range of motion, inability to repair, DVT, PE, anesthetic complications, etc.  TECHNIQUE:  With the patient in supine beach-chair position, after induction of adequate anesthesia 2 g of Kefzol, the right shoulder was prepped and draped in the usual sterile fashion.  Surgical marker utilized to delineate the acromion AC joint and coracoid.  She had good range of motion of the shoulder.  Previous surgical incision was utilized 2 cm over the anterior lateral aspect of the acromion.  A 0.25% Marcaine with epinephrine was infiltrated into the deltoid for hemostasis.  I divided the raphae in line with the skin incision and a self-retaining retractor was placed, digitally lysed subacromial adhesions.  Small spur off the anterolateral aspect of the acromion was removed with 3 mm Kerrison.  A retracted severe shredded rotator cuff was noted particularly of the supraspinatus.  It was retracted and multiple attempts to retrieve it resulted in dissolution of the tendon. The subscap  was intact as was the biceps tendon within its groove.  The infraspinatus was torn off as well.  Posteriorly I was able to find the infraspinatus and gently mobilize it anteriorly and laterally.  Had a trough in the bone lateral to the articular process in the mid-substance where the supraspinatus was, and fashioned a pilot hole with an awl, I put a swivel lock in that with fiber tape into the bone after I had threaded the fiber tape through the infraspinatus tendon with a scorpion suture passer.  There was well advancement of the infraspinatus performed.  These ends were placed down in the swivel lock without undue tension in the arm at the side and securing it.  The swivel that was first placed lateral to the articular process and after threading the fiber tape through that, and then threading it through the infraspinatus with a scorpion suture passer, that was then advanced 1.5 cm anteriorly and laterally, and then secured in the second swivel lock over the lateral aspect of the greater tuberosity, fashioning the pilot hole and then inserting it into the 2nd swivel lock with the arm at the side.  No undue tension.  Excellent purchase was achieved.  This helped cover some of the defect noted.  We copiously irrigated the wound.  We had  performed a bursectomy as well.  Again on re-examining, there was no opportunity to close down the insufficiency from the tuberosity to the glenoid due to the supraspinatus.  I therefore copiously irrigated the wound, repaired the raphe with 1 Vicryl, subcu with 2-0, and skin with Prolene.  Sterile dressing applied.  We did have scar tissue that was excised and removed and digitally lysed in the subacromial space. Previous Ethibond suture was incorporated into the bone, was stable, and we did not disrupt that tissue for that purpose.  Placed in a sling, extubated without difficulty, and transported to the recovery room in satisfactory condition.  The  patient tolerated the procedure well.  No complications.  Assistant, Lacie Draft, [place].     Susa Day, M.D.     Geralynn Rile  D:  05/09/2016  T:  05/09/2016  Job:  [id number]

## 2016-05-13 ENCOUNTER — Encounter (HOSPITAL_COMMUNITY): Payer: Self-pay | Admitting: Specialist

## 2016-08-16 ENCOUNTER — Ambulatory Visit: Payer: Medicare Other | Admitting: Diagnostic Neuroimaging

## 2016-10-09 NOTE — Addendum Note (Signed)
Addendum  created 10/09/16 1347 by Sandia Pfund, MD   Sign clinical note    

## 2017-04-09 ENCOUNTER — Ambulatory Visit: Payer: Self-pay | Admitting: Orthopedic Surgery

## 2017-04-16 ENCOUNTER — Ambulatory Visit: Payer: Self-pay | Admitting: Orthopedic Surgery

## 2017-04-16 NOTE — H&P (Signed)
TOTAL HIP ADMISSION H&P  Patient is admitted for right total hip arthroplasty.  Subjective:  Chief Complaint: right hip pain  HPI: Cheryl Cooper, 77 y.o. female, has a history of pain and functional disability in the right hip(s) due to subchondral insufficiency fracture and patient has failed non-surgical conservative treatments for greater than 12 weeks to include flexibility and strengthening excercises, use of assistive devices, weight reduction as appropriate and activity modification.  Onset of symptoms was abrupt starting 2 years ago with rapidlly worsening course since that time.The patient noted no past surgery on the right hip(s).  Patient currently rates pain in the right hip at 10 out of 10 with activity. Patient has night pain, worsening of pain with activity and weight bearing, pain that interfers with activities of daily living, pain with passive range of motion and crepitus. Patient has evidence of subchondral sclerosis, periarticular osteophytes, joint space narrowing and subchondral fracture with collapse by imaging studies. This condition presents safety issues increasing the risk of falls.  There is no current active infection.  Patient Active Problem List   Diagnosis Date Noted  . Complete rotator cuff tear 05/09/2016  . Brainstem stroke ([place]) 01/04/2015  . Dizziness and giddiness 01/04/2015  . Post concussion syndrome 01/04/2015  . Rotator cuff tear, right 11/21/2011   Past Medical History:  Diagnosis Date  . Deafness in right ear   . Diabetes mellitus    diet controlled  . GERD (gastroesophageal reflux disease)   . Hypertension   . Hypothyroid   . Parkinson's disease [facility])    "my doctor says i have a form of parkinsons the middle one that affects you balance"  . PONV (postoperative nausea and vomiting) yrs ago, none recent    Past Surgical History:  Procedure Laterality Date  . ABDOMINAL HYSTERECTOMY  1986  . BACK SURGERY  2010   lower  . left elobow  surgery  2003  . left rotator cuff  2001  . r foot surgery  1995  . right hand surgery  2001  . right index finger surgery  2009  . SHOULDER OPEN ROTATOR CUFF REPAIR  11/21/2011   Procedure: ROTATOR CUFF REPAIR SHOULDER OPEN;  Surgeon: Johnn Hai, MD;  Location: [facility];  Service: Orthopedics;  Laterality: Right;  with subacromial decompression  . SHOULDER OPEN ROTATOR CUFF REPAIR Right 05/09/2016   Procedure: Right shoulder mini open revision rotator cuff repair, subacromial decompression;  Surgeon: Susa Day, MD;  Location: [facility];  Service: Orthopedics;  Laterality: Right;    Current Outpatient Medications  Medication Sig Dispense Refill Last Dose  . acetaminophen (TYLENOL) 500 MG tablet Take 1,000 mg by mouth 2 (two) times daily as needed (for pain.).     [name] alendronate (FOSAMAX) 70 MG tablet Take 70 mg by mouth every Monday. Take with a full glass of water on an empty stomach.     [name] amLODipine (NORVASC) 5 MG tablet Take 5 mg by mouth daily.    05/09/2016 at Unknown time  . aspirin EC 81 MG tablet Take 1 tablet (81 mg total) by mouth daily. (Patient taking differently: Take 81 mg by mouth 4 (four) times a week. ) 14 tablet 1   . Calcium-Vitamin D-Vitamin K (VIACTIV) 034-742-59 MG-UNT-MCG CHEW Chew 2 tablets by mouth daily.     . Cholecalciferol (VITAMIN D3) 2000 units capsule Take 2,000 Units by mouth daily.   Past Month at Unknown time  . Dextromethorphan-Guaifenesin (CORICIDIN HBP CONGESTION/COUGH PO) Take 1 tablet  by mouth 2 (two) times daily as needed (congestion).   Past Month at Unknown time  . escitalopram (LEXAPRO) 10 MG tablet Take 10 mg by mouth daily.   3 05/09/2016 at Unknown time  . esomeprazole (NEXIUM) 40 MG capsule Take 40 mg by mouth 2 (two) times daily.   7 05/09/2016 at Unknown time  . ezetimibe-simvastatin (VYTORIN) 10-80 MG per tablet Take 1 tablet by mouth at bedtime.   05/08/2016 at Unknown time  . lamoTRIgine (LAMICTAL) 100 MG tablet Take 100 mg by mouth daily.     05/09/2016 at Unknown time  . levothyroxine (SYNTHROID, LEVOTHROID) 75 MCG tablet Take 75 mcg by mouth daily with breakfast.   05/09/2016 at Unknown time  . Multiple Vitamin (MULTIVITAMIN) tablet Take 2 tablets by mouth daily. Women's Multivitamin (Gummiest)   Past Month at Unknown time  . oxybutynin (DITROPAN-XL) 10 MG 24 hr tablet Take 10 mg by mouth daily.   9 05/08/2016 at Unknown time  . SUPER B COMPLEX/C PO Take 1 tablet by mouth daily.   Past Month at Unknown time   No current facility-administered medications for this visit.    Allergies  Allergen Reactions  . Atorvastatin Other (See Comments)    Unbalanced  . Chlordiazepoxide-Clidinium     Dizziness (intolerance)  . Codeine Nausea Only  . Other     Tomato sauce - severe acid reflux     Social History   Tobacco Use  . Smoking status: Never Smoker  . Smokeless tobacco: Never Used  Substance Use Topics  . Alcohol use: Yes    Comment: glass wine/day    Family History  Problem Relation Age of Onset  . Stroke Father   . Stroke Sister   . Stroke Brother   . Diabetes Brother   . Stroke Sister        TIA     Review of Systems  Constitutional: Negative.   HENT: Positive for tinnitus.   Eyes: Negative.   Respiratory: Negative.   Cardiovascular: Negative.   Gastrointestinal: Positive for heartburn.  Genitourinary: Negative.   Musculoskeletal: Positive for joint pain.  Skin: Negative.   Neurological: Negative.   Endo/Heme/Allergies: Negative.   Psychiatric/Behavioral: Negative.     Objective:  Physical Exam  Vitals reviewed. Constitutional: She is oriented to person, place, and time. She appears well-developed and well-nourished.  HENT:  Head: Normocephalic and atraumatic.  Eyes: Conjunctivae and EOM are normal. Pupils are equal, round, and reactive to light.  Neck: Normal range of motion. Neck supple.  Cardiovascular: Normal rate, regular rhythm and intact distal pulses.  Respiratory: Effort normal. No  respiratory distress.  GI: Soft. She exhibits no distension.  Genitourinary:  Genitourinary Comments: deferred  Musculoskeletal:       Right shoulder: She exhibits decreased range of motion and crepitus.  Neurological: She is alert and oriented to person, place, and time. She has normal reflexes.  Skin: Skin is warm and dry.  Psychiatric: She has a normal mood and affect. Her behavior is normal. Judgment and thought content normal.    Vital signs in last 24 hours: @VSRANGES @  Labs:   Estimated body mass index is 28.17 kg/m as calculated from the following:   Height as of 05/09/16: 5\' 3"  (1.6 m).   Weight as of 05/09/16: 72.1 kg (159 lb).   Imaging Review Plain radiographs demonstrate severe degenerative joint disease of the right hip(s). The bone quality appears to be adequate for age and reported activity level.  Assessment/Plan:  End stage arthritis, right hip(s)  The patient history, physical examination, clinical judgement of the provider and imaging studies are consistent with end stage degenerative joint disease of the right hip(s) and total hip arthroplasty is deemed medically necessary. The treatment options including medical management, injection therapy, arthroscopy and arthroplasty were discussed at length. The risks and benefits of total hip arthroplasty were presented and reviewed. The risks due to aseptic loosening, infection, stiffness, dislocation/subluxation,  thromboembolic complications and other imponderables were discussed.  The patient acknowledged the explanation, agreed to proceed with the plan and consent was signed. Patient is being admitted for inpatient treatment for surgery, pain control, PT, OT, prophylactic antibiotics, VTE prophylaxis, progressive ambulation and ADL's and discharge planning.The patient is planning to be discharged home with HEP

## 2017-04-16 NOTE — Patient Instructions (Addendum)
Cheryl Cooper  04/16/2017   Your procedure is scheduled on: 04-24-17   Report to [facility] Main  Entrance Report to Admitting at 7:30  AM    Call this number if you have problems the morning of surgery [phone]   Remember: Do not eat food or drink liquids :After Midnight.     Take these medicines the morning of surgery with A SIP OF WATER: Amlodipine (Norvasc), Escitalopram (Lexapro), Levothyroxine (Synthroid), Lamotrigine (Lamitical), and Esomeprazole (Nexium)                                You may not have any metal on your body including hair pins and              piercings  Do not wear jewelry, make-up, lotions, powders or perfumes, deodorant             Do not wear nail polish.  Do not shave  48 hours prior to surgery.               Do not bring valuables to the hospital. [place].  Contacts, dentures or bridgework may not be worn into surgery.  Leave suitcase in the car. After surgery it may be brought to your room.                 Please read over the following fact sheets you were given: _____________________________________________________________________         [facility] - Preparing for Surgery Before surgery, you can play an important role.  Because skin is not sterile, your skin needs to be as free of germs as possible.  You can reduce the number of germs on your skin by washing with CHG (chlorahexidine gluconate) soap before surgery.  CHG is an antiseptic cleaner which kills germs and bonds with the skin to continue killing germs even after washing. Please DO NOT use if you have an allergy to CHG or antibacterial soaps.  If your skin becomes reddened/irritated stop using the CHG and inform your nurse when you arrive at Short Stay. Do not shave (including legs and underarms) for at least 48 hours prior to the first CHG shower.  You may shave your face/neck. Please follow these instructions  carefully:  1.  Shower with CHG Soap the night before surgery and the  morning of Surgery.  2.  If you choose to wash your hair, wash your hair first as usual with your  normal  shampoo.  3.  After you shampoo, rinse your hair and body thoroughly to remove the  shampoo.                           4.  Use CHG as you would any other liquid soap.  You can apply chg directly  to the skin and wash                       Gently with a scrungie or clean washcloth.  5.  Apply the CHG Soap to your body ONLY FROM THE NECK DOWN.   Do not use on face/ open  Wound or open sores. Avoid contact with eyes, ears mouth and genitals (private parts).                       Wash face,  Genitals (private parts) with your normal soap.             6.  Wash thoroughly, paying special attention to the area where your surgery  will be performed.  7.  Thoroughly rinse your body with warm water from the neck down.  8.  DO NOT shower/wash with your normal soap after using and rinsing off  the CHG Soap.                9.  Pat yourself dry with a clean towel.            10.  Wear clean pajamas.            11.  Place clean sheets on your bed the night of your first shower and do not  sleep with pets. Day of Surgery : Do not apply any lotions/deodorants the morning of surgery.  Please wear clean clothes to the hospital/surgery center.  FAILURE TO FOLLOW THESE INSTRUCTIONS MAY RESULT IN THE CANCELLATION OF YOUR SURGERY PATIENT SIGNATURE_________________________________  NURSE SIGNATURE__________________________________  ________________________________________________________________________   Adam Phenix  An incentive spirometer is a tool that can help keep your lungs clear and active. This tool measures how well you are filling your lungs with each breath. Taking long deep breaths may help reverse or decrease the chance of developing breathing (pulmonary) problems (especially infection)  following:  A long period of time when you are unable to move or be active. BEFORE THE PROCEDURE   If the spirometer includes an indicator to show your best effort, your nurse or respiratory therapist will set it to a desired goal.  If possible, sit up straight or lean slightly forward. Try not to slouch.  Hold the incentive spirometer in an upright position. INSTRUCTIONS FOR USE  1. Sit on the edge of your bed if possible, or sit up as far as you can in bed or on a chair. 2. Hold the incentive spirometer in an upright position. 3. Breathe out normally. 4. Place the mouthpiece in your mouth and seal your lips tightly around it. 5. Breathe in slowly and as deeply as possible, raising the piston or the ball toward the top of the column. 6. Hold your breath for 3-5 seconds or for as long as possible. Allow the piston or ball to fall to the bottom of the column. 7. Remove the mouthpiece from your mouth and breathe out normally. 8. Rest for a few seconds and repeat Steps 1 through 7 at least 10 times every 1-2 hours when you are awake. Take your time and take a few normal breaths between deep breaths. 9. The spirometer may include an indicator to show your best effort. Use the indicator as a goal to work toward during each repetition. 10. After each set of 10 deep breaths, practice coughing to be sure your lungs are clear. If you have an incision (the cut made at the time of surgery), support your incision when coughing by placing a pillow or rolled up towels firmly against it. Once you are able to get out of bed, walk around indoors and cough well. You may stop using the incentive spirometer when instructed by your caregiver.  RISKS AND COMPLICATIONS  Take your time so you do not get  dizzy or light-headed.  If you are in pain, you may need to take or ask for pain medication before doing incentive spirometry. It is harder to take a deep breath if you are having pain. AFTER USE  Rest and  breathe slowly and easily.  It can be helpful to keep track of a log of your progress. Your caregiver can provide you with a simple table to help with this. If you are using the spirometer at home, follow these instructions: [place] IF:   You are having difficultly using the spirometer.  You have trouble using the spirometer as often as instructed.  Your pain medication is not giving enough relief while using the spirometer.  You develop fever of 100.5 F (38.1 C) or higher. SEEK IMMEDIATE MEDICAL CARE IF:   You cough up bloody sputum that had not been present before.  You develop fever of 102 F (38.9 C) or greater.  You develop worsening pain at or near the incision site. MAKE SURE YOU:   Understand these instructions.  Will watch your condition.  Will get help right away if you are not doing well or get worse. Document Released: 06/17/2006 Document Revised: 04/29/2011 Document Reviewed: 08/18/2006 Exit[employer] Patient Information 2014 [employer], [place].   ________________________________________________________________________  WHAT IS A BLOOD TRANSFUSION? Blood Transfusion Information  A transfusion is the replacement of blood or some of its parts. Blood is made up of multiple cells which provide different functions.  Red blood cells carry oxygen and are used for blood loss replacement.  White blood cells fight against infection.  Platelets control bleeding.  Plasma helps clot blood.  Other blood products are available for specialized needs, such as hemophilia or other clotting disorders. BEFORE THE TRANSFUSION  Who gives blood for transfusions?   Healthy volunteers who are fully evaluated to make sure their blood is safe. This is blood bank blood. Transfusion therapy is the safest it has ever been in the practice of medicine. Before blood is taken from a donor, a complete history is taken to make sure that person has no history of diseases nor engages in  risky social behavior (examples are intravenous drug use or sexual activity with multiple partners). The donor's travel history is screened to minimize risk of transmitting infections, such as malaria. The donated blood is tested for signs of infectious diseases, such as HIV and hepatitis. The blood is then tested to be sure it is compatible with you in order to minimize the chance of a transfusion reaction. If you or a relative donates blood, this is often done in anticipation of surgery and is not appropriate for emergency situations. It takes many days to process the donated blood. RISKS AND COMPLICATIONS Although transfusion therapy is very safe and saves many lives, the main dangers of transfusion include:   Getting an infectious disease.  Developing a transfusion reaction. This is an allergic reaction to something in the blood you were given. Every precaution is taken to prevent this. The decision to have a blood transfusion has been considered carefully by your caregiver before blood is given. Blood is not given unless the benefits outweigh the risks. AFTER THE TRANSFUSION  Right after receiving a blood transfusion, you will usually feel much better and more energetic. This is especially true if your red blood cells have gotten low (anemic). The transfusion raises the level of the red blood cells which carry oxygen, and this usually causes an energy increase.  The nurse administering the transfusion will  monitor you carefully for complications. HOME CARE INSTRUCTIONS  No special instructions are needed after a transfusion. You may find your energy is better. Speak with your caregiver about any limitations on activity for underlying diseases you may have. SEEK MEDICAL CARE IF:   Your condition is not improving after your transfusion.  You develop redness or irritation at the intravenous (IV) site. SEEK IMMEDIATE MEDICAL CARE IF:  Any of the following symptoms occur over the next 12  hours:  Shaking chills.  You have a temperature by mouth above 102 F (38.9 C), not controlled by medicine.  Chest, back, or muscle pain.  People around you feel you are not acting correctly or are confused.  Shortness of breath or difficulty breathing.  Dizziness and fainting.  You get a rash or develop hives.  You have a decrease in urine output.  Your urine turns a dark color or changes to pink, red, or brown. Any of the following symptoms occur over the next 10 days:  You have a temperature by mouth above 102 F (38.9 C), not controlled by medicine.  Shortness of breath.  Weakness after normal activity.  The white part of the eye turns yellow (jaundice).  You have a decrease in the amount of urine or are urinating less often.  Your urine turns a dark color or changes to pink, red, or brown. Document Released: 02/02/2000 Document Revised: 04/29/2011 Document Reviewed: 09/21/2007 [facility] Patient Information 2014 [place], [place].  _______________________________________________________________________

## 2017-04-16 NOTE — H&P (View-Only) (Signed)
TOTAL HIP ADMISSION H&P  Patient is admitted for right total hip arthroplasty.  Subjective:  Chief Complaint: right hip pain  HPI: Cheryl Cooper, 77 y.o. female, has a history of pain and functional disability in the right hip(s) due to subchondral insufficiency fracture and patient has failed non-surgical conservative treatments for greater than 12 weeks to include flexibility and strengthening excercises, use of assistive devices, weight reduction as appropriate and activity modification.  Onset of symptoms was abrupt starting 2 years ago with rapidlly worsening course since that time.The patient noted no past surgery on the right hip(s).  Patient currently rates pain in the right hip at 10 out of 10 with activity. Patient has night pain, worsening of pain with activity and weight bearing, pain that interfers with activities of daily living, pain with passive range of motion and crepitus. Patient has evidence of subchondral sclerosis, periarticular osteophytes, joint space narrowing and subchondral fracture with collapse by imaging studies. This condition presents safety issues increasing the risk of falls.  There is no current active infection.  Patient Active Problem List   Diagnosis Date Noted  . Complete rotator cuff tear 05/09/2016  . Brainstem stroke ([place]) 01/04/2015  . Dizziness and giddiness 01/04/2015  . Post concussion syndrome 01/04/2015  . Rotator cuff tear, right 11/21/2011   Past Medical History:  Diagnosis Date  . Deafness in right ear   . Diabetes mellitus    diet controlled  . GERD (gastroesophageal reflux disease)   . Hypertension   . Hypothyroid   . Parkinson's disease [facility])    "my doctor says i have a form of parkinsons the middle one that affects you balance"  . PONV (postoperative nausea and vomiting) yrs ago, none recent    Past Surgical History:  Procedure Laterality Date  . ABDOMINAL HYSTERECTOMY  1986  . BACK SURGERY  2010   lower  . left elobow  surgery  2003  . left rotator cuff  2001  . r foot surgery  1995  . right hand surgery  2001  . right index finger surgery  2009  . SHOULDER OPEN ROTATOR CUFF REPAIR  11/21/2011   Procedure: ROTATOR CUFF REPAIR SHOULDER OPEN;  Surgeon: Johnn Hai, MD;  Location: [facility];  Service: Orthopedics;  Laterality: Right;  with subacromial decompression  . SHOULDER OPEN ROTATOR CUFF REPAIR Right 05/09/2016   Procedure: Right shoulder mini open revision rotator cuff repair, subacromial decompression;  Surgeon: Susa Day, MD;  Location: [facility];  Service: Orthopedics;  Laterality: Right;    Current Outpatient Medications  Medication Sig Dispense Refill Last Dose  . acetaminophen (TYLENOL) 500 MG tablet Take 1,000 mg by mouth 2 (two) times daily as needed (for pain.).     [name] alendronate (FOSAMAX) 70 MG tablet Take 70 mg by mouth every Monday. Take with a full glass of water on an empty stomach.     [name] amLODipine (NORVASC) 5 MG tablet Take 5 mg by mouth daily.    05/09/2016 at Unknown time  . aspirin EC 81 MG tablet Take 1 tablet (81 mg total) by mouth daily. (Patient taking differently: Take 81 mg by mouth 4 (four) times a week. ) 14 tablet 1   . Calcium-Vitamin D-Vitamin K (VIACTIV) 161-096-04 MG-UNT-MCG CHEW Chew 2 tablets by mouth daily.     . Cholecalciferol (VITAMIN D3) 2000 units capsule Take 2,000 Units by mouth daily.   Past Month at Unknown time  . Dextromethorphan-Guaifenesin (CORICIDIN HBP CONGESTION/COUGH PO) Take 1 tablet  by mouth 2 (two) times daily as needed (congestion).   Past Month at Unknown time  . escitalopram (LEXAPRO) 10 MG tablet Take 10 mg by mouth daily.   3 05/09/2016 at Unknown time  . esomeprazole (NEXIUM) 40 MG capsule Take 40 mg by mouth 2 (two) times daily.   7 05/09/2016 at Unknown time  . ezetimibe-simvastatin (VYTORIN) 10-80 MG per tablet Take 1 tablet by mouth at bedtime.   05/08/2016 at Unknown time  . lamoTRIgine (LAMICTAL) 100 MG tablet Take 100 mg by mouth daily.     05/09/2016 at Unknown time  . levothyroxine (SYNTHROID, LEVOTHROID) 75 MCG tablet Take 75 mcg by mouth daily with breakfast.   05/09/2016 at Unknown time  . Multiple Vitamin (MULTIVITAMIN) tablet Take 2 tablets by mouth daily. Women's Multivitamin (Gummiest)   Past Month at Unknown time  . oxybutynin (DITROPAN-XL) 10 MG 24 hr tablet Take 10 mg by mouth daily.   9 05/08/2016 at Unknown time  . SUPER B COMPLEX/C PO Take 1 tablet by mouth daily.   Past Month at Unknown time   No current facility-administered medications for this visit.    Allergies  Allergen Reactions  . Atorvastatin Other (See Comments)    Unbalanced  . Chlordiazepoxide-Clidinium     Dizziness (intolerance)  . Codeine Nausea Only  . Other     Tomato sauce - severe acid reflux     Social History   Tobacco Use  . Smoking status: Never Smoker  . Smokeless tobacco: Never Used  Substance Use Topics  . Alcohol use: Yes    Comment: glass wine/day    Family History  Problem Relation Age of Onset  . Stroke Father   . Stroke Sister   . Stroke Brother   . Diabetes Brother   . Stroke Sister        TIA     Review of Systems  Constitutional: Negative.   HENT: Positive for tinnitus.   Eyes: Negative.   Respiratory: Negative.   Cardiovascular: Negative.   Gastrointestinal: Positive for heartburn.  Genitourinary: Negative.   Musculoskeletal: Positive for joint pain.  Skin: Negative.   Neurological: Negative.   Endo/Heme/Allergies: Negative.   Psychiatric/Behavioral: Negative.     Objective:  Physical Exam  Vitals reviewed. Constitutional: She is oriented to person, place, and time. She appears well-developed and well-nourished.  HENT:  Head: Normocephalic and atraumatic.  Eyes: Conjunctivae and EOM are normal. Pupils are equal, round, and reactive to light.  Neck: Normal range of motion. Neck supple.  Cardiovascular: Normal rate, regular rhythm and intact distal pulses.  Respiratory: Effort normal. No  respiratory distress.  GI: Soft. She exhibits no distension.  Genitourinary:  Genitourinary Comments: deferred  Musculoskeletal:       Right shoulder: She exhibits decreased range of motion and crepitus.  Neurological: She is alert and oriented to person, place, and time. She has normal reflexes.  Skin: Skin is warm and dry.  Psychiatric: She has a normal mood and affect. Her behavior is normal. Judgment and thought content normal.    Vital signs in last 24 hours: @VSRANGES @  Labs:   Estimated body mass index is 28.17 kg/m as calculated from the following:   Height as of 05/09/16: 5\' 3"  (1.6 m).   Weight as of 05/09/16: 72.1 kg (159 lb).   Imaging Review Plain radiographs demonstrate severe degenerative joint disease of the right hip(s). The bone quality appears to be adequate for age and reported activity level.  Assessment/Plan:  End stage arthritis, right hip(s)  The patient history, physical examination, clinical judgement of the provider and imaging studies are consistent with end stage degenerative joint disease of the right hip(s) and total hip arthroplasty is deemed medically necessary. The treatment options including medical management, injection therapy, arthroscopy and arthroplasty were discussed at length. The risks and benefits of total hip arthroplasty were presented and reviewed. The risks due to aseptic loosening, infection, stiffness, dislocation/subluxation,  thromboembolic complications and other imponderables were discussed.  The patient acknowledged the explanation, agreed to proceed with the plan and consent was signed. Patient is being admitted for inpatient treatment for surgery, pain control, PT, OT, prophylactic antibiotics, VTE prophylaxis, progressive ambulation and ADL's and discharge planning.The patient is planning to be discharged home with HEP

## 2017-04-16 NOTE — Progress Notes (Addendum)
04-04-17 Surgical clearance from Dr. Suzy Bouchard, EKG,               HGA1C,  CMP (WNL),  CBC w/Diff (WNL) on chart

## 2017-04-17 ENCOUNTER — Other Ambulatory Visit: Payer: Self-pay

## 2017-04-17 ENCOUNTER — Encounter (HOSPITAL_COMMUNITY): Payer: Self-pay

## 2017-04-17 ENCOUNTER — Encounter (HOSPITAL_COMMUNITY)
Admission: RE | Admit: 2017-04-17 | Discharge: 2017-04-17 | Disposition: A | Discharge status: Home / Self Care | Payer: Medicare Other | Source: Ambulatory Visit | Attending: Orthopedic Surgery | Admitting: Orthopedic Surgery

## 2017-04-17 DIAGNOSIS — M1612 Unilateral primary osteoarthritis, left hip: Secondary | ICD-10-CM | POA: Insufficient documentation

## 2017-04-17 DIAGNOSIS — Z01818 Encounter for other preprocedural examination: Secondary | ICD-10-CM | POA: Diagnosis not present

## 2017-04-17 LAB — HEMOGLOBIN A1C
Hgb A1c MFr Bld: 7.1 % — ABNORMAL HIGH (ref 4.8–5.6)
MEAN PLASMA GLUCOSE: 157.07 mg/dL

## 2017-04-17 LAB — SURGICAL PCR SCREEN
MRSA, PCR: NEGATIVE
Staphylococcus aureus: NEGATIVE

## 2017-04-18 LAB — ABO/RH: ABO/RH(D): A POS

## 2017-04-18 NOTE — Pre-Procedure Instructions (Signed)
Hgb A1C results 04/17/17 faxed to Dr. Lyla Glassing via epic.

## 2017-04-23 MED ORDER — TRANEXAMIC ACID 1000 MG/10ML IV SOLN
1000.0000 mg | INTRAVENOUS | Status: AC
  Administered 2017-04-24: 1000 mg via INTRAVENOUS
  Filled 2017-04-23: qty 1100

## 2017-04-24 ENCOUNTER — Inpatient Hospital Stay (HOSPITAL_COMMUNITY): Payer: Medicare Other | Admitting: Anesthesiology

## 2017-04-24 ENCOUNTER — Encounter (HOSPITAL_COMMUNITY): Payer: Self-pay | Admitting: Emergency Medicine

## 2017-04-24 ENCOUNTER — Inpatient Hospital Stay (HOSPITAL_COMMUNITY): Payer: Medicare Other

## 2017-04-24 ENCOUNTER — Inpatient Hospital Stay (HOSPITAL_COMMUNITY)
Admission: RE | Admit: 2017-04-24 | Discharge: 2017-04-25 | DRG: 470 | Disposition: A | Discharge status: Home / Self Care | Payer: Medicare Other | Source: Ambulatory Visit | Attending: Orthopedic Surgery | Admitting: Orthopedic Surgery

## 2017-04-24 ENCOUNTER — Encounter (HOSPITAL_COMMUNITY)
Admission: RE | Disposition: A | Discharge status: Home / Self Care | Payer: Self-pay | Source: Ambulatory Visit | Attending: Orthopedic Surgery

## 2017-04-24 ENCOUNTER — Other Ambulatory Visit: Payer: Self-pay

## 2017-04-24 DIAGNOSIS — M1611 Unilateral primary osteoarthritis, right hip: Principal | ICD-10-CM | POA: Diagnosis present

## 2017-04-24 DIAGNOSIS — K219 Gastro-esophageal reflux disease without esophagitis: Secondary | ICD-10-CM | POA: Diagnosis present

## 2017-04-24 DIAGNOSIS — Z7983 Long term (current) use of bisphosphonates: Secondary | ICD-10-CM

## 2017-04-24 DIAGNOSIS — Z7982 Long term (current) use of aspirin: Secondary | ICD-10-CM | POA: Diagnosis not present

## 2017-04-24 DIAGNOSIS — I10 Essential (primary) hypertension: Secondary | ICD-10-CM | POA: Diagnosis present

## 2017-04-24 DIAGNOSIS — H9192 Unspecified hearing loss, left ear: Secondary | ICD-10-CM | POA: Diagnosis present

## 2017-04-24 DIAGNOSIS — Z419 Encounter for procedure for purposes other than remedying health state, unspecified: Secondary | ICD-10-CM

## 2017-04-24 DIAGNOSIS — Z823 Family history of stroke: Secondary | ICD-10-CM

## 2017-04-24 DIAGNOSIS — Z833 Family history of diabetes mellitus: Secondary | ICD-10-CM

## 2017-04-24 DIAGNOSIS — E039 Hypothyroidism, unspecified: Secondary | ICD-10-CM | POA: Diagnosis present

## 2017-04-24 DIAGNOSIS — Z09 Encounter for follow-up examination after completed treatment for conditions other than malignant neoplasm: Secondary | ICD-10-CM

## 2017-04-24 DIAGNOSIS — E119 Type 2 diabetes mellitus without complications: Secondary | ICD-10-CM | POA: Diagnosis present

## 2017-04-24 HISTORY — PX: TOTAL HIP ARTHROPLASTY: SHX124

## 2017-04-24 LAB — TYPE AND SCREEN
ABO/RH(D): A POS
Antibody Screen: NEGATIVE

## 2017-04-24 LAB — GLUCOSE, CAPILLARY
GLUCOSE-CAPILLARY: 141 mg/dL — AB (ref 65–99)
Glucose-Capillary: 112 mg/dL — ABNORMAL HIGH (ref 65–99)

## 2017-04-24 SURGERY — ARTHROPLASTY, HIP, TOTAL, ANTERIOR APPROACH
Anesthesia: Spinal | Site: Hip | Laterality: Right

## 2017-04-24 MED ORDER — LACTATED RINGERS IV SOLN
INTRAVENOUS | Status: DC
  Administered 2017-04-24: 08:00:00 via INTRAVENOUS

## 2017-04-24 MED ORDER — ISOPROPYL ALCOHOL 70 % SOLN
Status: DC | PRN
  Administered 2017-04-24: 1 via TOPICAL

## 2017-04-24 MED ORDER — KETOROLAC TROMETHAMINE 15 MG/ML IJ SOLN
7.5000 mg | Freq: Four times a day (QID) | INTRAMUSCULAR | Status: AC
  Administered 2017-04-24 – 2017-04-25 (×4): 7.5 mg via INTRAVENOUS
  Filled 2017-04-24 (×4): qty 1

## 2017-04-24 MED ORDER — METHOCARBAMOL 500 MG PO TABS: 500.0000 mg | ORAL_TABLET | Freq: Four times a day (QID) | ORAL | Status: DC | PRN

## 2017-04-24 MED ORDER — CHLORHEXIDINE GLUCONATE 4 % EX LIQD: 60.0000 mL | Freq: Once | CUTANEOUS | Status: DC

## 2017-04-24 MED ORDER — BUPIVACAINE HCL (PF) 0.25 % IJ SOLN
INTRAMUSCULAR | Status: DC | PRN
  Administered 2017-04-24: 30 mL

## 2017-04-24 MED ORDER — LEVOTHYROXINE SODIUM 75 MCG PO TABS
75.0000 ug | ORAL_TABLET | Freq: Every day | ORAL | Status: DC
  Administered 2017-04-25: 08:00:00 75 ug via ORAL
  Filled 2017-04-24: qty 1

## 2017-04-24 MED ORDER — FENTANYL CITRATE (PF) 100 MCG/2ML IJ SOLN: 25.0000 ug | INTRAMUSCULAR | Status: DC | PRN

## 2017-04-24 MED ORDER — ASPIRIN 81 MG PO CHEW
81.0000 mg | CHEWABLE_TABLET | Freq: Two times a day (BID) | ORAL | Status: DC
  Administered 2017-04-24 – 2017-04-25 (×2): 81 mg via ORAL
  Filled 2017-04-24 (×2): qty 1

## 2017-04-24 MED ORDER — PROPOFOL 10 MG/ML IV BOLUS
INTRAVENOUS | Status: AC
  Filled 2017-04-24: qty 40

## 2017-04-24 MED ORDER — SODIUM CHLORIDE 0.9 % IV SOLN: INTRAVENOUS | Status: DC

## 2017-04-24 MED ORDER — MORPHINE SULFATE (PF) 2 MG/ML IV SOLN: 0.5000 mg | INTRAVENOUS | Status: DC | PRN

## 2017-04-24 MED ORDER — SODIUM CHLORIDE 0.9 % IJ SOLN
INTRAMUSCULAR | Status: AC
  Filled 2017-04-24: qty 50

## 2017-04-24 MED ORDER — PROPOFOL 10 MG/ML IV BOLUS
INTRAVENOUS | Status: AC
  Filled 2017-04-24: qty 20

## 2017-04-24 MED ORDER — DOCUSATE SODIUM 100 MG PO CAPS
100.0000 mg | ORAL_CAPSULE | Freq: Two times a day (BID) | ORAL | Status: DC
  Administered 2017-04-24 – 2017-04-25 (×2): 100 mg via ORAL
  Filled 2017-04-24 (×2): qty 1

## 2017-04-24 MED ORDER — PROPOFOL 500 MG/50ML IV EMUL
INTRAVENOUS | Status: DC | PRN
  Administered 2017-04-24: 50 ug/kg/min via INTRAVENOUS

## 2017-04-24 MED ORDER — ONDANSETRON HCL 4 MG PO TABS: 4.0000 mg | ORAL_TABLET | Freq: Four times a day (QID) | ORAL | Status: DC | PRN

## 2017-04-24 MED ORDER — SODIUM CHLORIDE 0.9 % IR SOLN
Status: DC | PRN
  Administered 2017-04-24: 1000 mL

## 2017-04-24 MED ORDER — ONDANSETRON HCL 4 MG/2ML IJ SOLN
INTRAMUSCULAR | Status: DC | PRN
  Administered 2017-04-24: 4 mg via INTRAVENOUS

## 2017-04-24 MED ORDER — ONDANSETRON HCL 4 MG/2ML IJ SOLN
INTRAMUSCULAR | Status: AC
  Filled 2017-04-24: qty 2

## 2017-04-24 MED ORDER — POVIDONE-IODINE 10 % EX SWAB
2.0000 "application " | Freq: Once | CUTANEOUS | Status: AC
  Administered 2017-04-24: 2 via TOPICAL

## 2017-04-24 MED ORDER — DEXAMETHASONE SODIUM PHOSPHATE 10 MG/ML IJ SOLN
INTRAMUSCULAR | Status: AC
  Filled 2017-04-24: qty 1

## 2017-04-24 MED ORDER — METHOCARBAMOL 1000 MG/10ML IJ SOLN
500.0000 mg | Freq: Four times a day (QID) | INTRAMUSCULAR | Status: DC | PRN
  Administered 2017-04-24: 500 mg via INTRAVENOUS
  Filled 2017-04-24: qty 550

## 2017-04-24 MED ORDER — ONDANSETRON HCL 4 MG/2ML IJ SOLN: 4.0000 mg | Freq: Four times a day (QID) | INTRAMUSCULAR | Status: DC | PRN

## 2017-04-24 MED ORDER — BUPIVACAINE IN DEXTROSE 0.75-8.25 % IT SOLN
INTRATHECAL | Status: DC | PRN
  Administered 2017-04-24: 2 mL via INTRATHECAL

## 2017-04-24 MED ORDER — ACETAMINOPHEN 325 MG PO TABS: 325.0000 mg | ORAL_TABLET | Freq: Four times a day (QID) | ORAL | Status: DC | PRN

## 2017-04-24 MED ORDER — SODIUM CHLORIDE 0.9 % IV SOLN
INTRAVENOUS | Status: DC
  Administered 2017-04-24: 15:00:00 via INTRAVENOUS

## 2017-04-24 MED ORDER — FENTANYL CITRATE (PF) 100 MCG/2ML IJ SOLN
INTRAMUSCULAR | Status: AC
  Filled 2017-04-24: qty 2

## 2017-04-24 MED ORDER — DIPHENHYDRAMINE HCL 12.5 MG/5ML PO ELIX: 12.5000 mg | ORAL_SOLUTION | ORAL | Status: DC | PRN

## 2017-04-24 MED ORDER — KETOROLAC TROMETHAMINE 30 MG/ML IJ SOLN
INTRAMUSCULAR | Status: AC
  Filled 2017-04-24: qty 1

## 2017-04-24 MED ORDER — HYDROCODONE-ACETAMINOPHEN 5-325 MG PO TABS
1.0000 | ORAL_TABLET | ORAL | Status: DC | PRN
  Administered 2017-04-24 – 2017-04-25 (×3): 2 via ORAL
  Filled 2017-04-24 (×4): qty 2

## 2017-04-24 MED ORDER — MENTHOL 3 MG MT LOZG: 1.0000 | LOZENGE | OROMUCOSAL | Status: DC | PRN

## 2017-04-24 MED ORDER — SODIUM CHLORIDE 0.9 % IJ SOLN
INTRAMUSCULAR | Status: DC | PRN
  Administered 2017-04-24: 30 mL

## 2017-04-24 MED ORDER — FENTANYL CITRATE (PF) 100 MCG/2ML IJ SOLN
INTRAMUSCULAR | Status: DC | PRN
  Administered 2017-04-24: 50 ug via INTRAVENOUS

## 2017-04-24 MED ORDER — ESCITALOPRAM OXALATE 10 MG PO TABS
10.0000 mg | ORAL_TABLET | Freq: Every day | ORAL | Status: DC
  Administered 2017-04-25: 10 mg via ORAL
  Filled 2017-04-24: qty 1

## 2017-04-24 MED ORDER — ALUM & MAG HYDROXIDE-SIMETH 200-200-20 MG/5ML PO SUSP
30.0000 mL | ORAL | Status: DC | PRN
  Administered 2017-04-24: 21:00:00 30 mL via ORAL
  Filled 2017-04-24: qty 30

## 2017-04-24 MED ORDER — METOCLOPRAMIDE HCL 5 MG/ML IJ SOLN: 5.0000 mg | Freq: Three times a day (TID) | INTRAMUSCULAR | Status: DC | PRN

## 2017-04-24 MED ORDER — EPHEDRINE SULFATE 50 MG/ML IJ SOLN
INTRAMUSCULAR | Status: DC | PRN
  Administered 2017-04-24 (×2): 5 mg via INTRAVENOUS
  Administered 2017-04-24 (×2): 10 mg via INTRAVENOUS
  Administered 2017-04-24 (×2): 5 mg via INTRAVENOUS

## 2017-04-24 MED ORDER — METOCLOPRAMIDE HCL 5 MG PO TABS: 5.0000 mg | ORAL_TABLET | Freq: Three times a day (TID) | ORAL | Status: DC | PRN

## 2017-04-24 MED ORDER — ACETAMINOPHEN 10 MG/ML IV SOLN
1000.0000 mg | INTRAVENOUS | Status: AC
  Administered 2017-04-24: 1000 mg via INTRAVENOUS
  Filled 2017-04-24: qty 100

## 2017-04-24 MED ORDER — PHENOL 1.4 % MT LIQD
1.0000 | OROMUCOSAL | Status: DC | PRN
  Filled 2017-04-24: qty 177

## 2017-04-24 MED ORDER — OXYBUTYNIN CHLORIDE ER 5 MG PO TB24
10.0000 mg | ORAL_TABLET | Freq: Every day | ORAL | Status: DC
  Administered 2017-04-24 – 2017-04-25 (×2): 10 mg via ORAL
  Filled 2017-04-24 (×2): qty 2

## 2017-04-24 MED ORDER — KETOROLAC TROMETHAMINE 30 MG/ML IJ SOLN
INTRAMUSCULAR | Status: DC | PRN
  Administered 2017-04-24: 30 mg

## 2017-04-24 MED ORDER — AMLODIPINE BESYLATE 5 MG PO TABS
5.0000 mg | ORAL_TABLET | Freq: Every day | ORAL | Status: DC
  Administered 2017-04-25: 10:00:00 5 mg via ORAL
  Filled 2017-04-24: qty 1

## 2017-04-24 MED ORDER — LAMOTRIGINE 100 MG PO TABS
100.0000 mg | ORAL_TABLET | Freq: Every day | ORAL | Status: DC
  Administered 2017-04-25: 10:00:00 100 mg via ORAL
  Filled 2017-04-24: qty 1

## 2017-04-24 MED ORDER — CEFAZOLIN SODIUM-DEXTROSE 2-4 GM/100ML-% IV SOLN
2.0000 g | INTRAVENOUS | Status: AC
  Administered 2017-04-24: 2 g via INTRAVENOUS
  Filled 2017-04-24: qty 100

## 2017-04-24 MED ORDER — DEXAMETHASONE SODIUM PHOSPHATE 10 MG/ML IJ SOLN
10.0000 mg | Freq: Once | INTRAMUSCULAR | Status: AC
  Administered 2017-04-25: 10 mg via INTRAVENOUS
  Filled 2017-04-24: qty 1

## 2017-04-24 MED ORDER — WATER FOR IRRIGATION, STERILE IR SOLN
Status: DC | PRN
  Administered 2017-04-24: 2000 mL

## 2017-04-24 MED ORDER — POLYETHYLENE GLYCOL 3350 17 G PO PACK: 17.0000 g | PACK | Freq: Every day | ORAL | Status: DC | PRN

## 2017-04-24 MED ORDER — BUPIVACAINE HCL (PF) 0.25 % IJ SOLN
INTRAMUSCULAR | Status: AC
  Filled 2017-04-24: qty 30

## 2017-04-24 MED ORDER — EZETIMIBE-SIMVASTATIN 10-80 MG PO TABS
1.0000 | ORAL_TABLET | Freq: Every day | ORAL | Status: DC
  Administered 2017-04-24: 1 via ORAL
  Filled 2017-04-24: qty 1

## 2017-04-24 MED ORDER — ISOPROPYL ALCOHOL 70 % SOLN
Status: AC
  Filled 2017-04-24: qty 480

## 2017-04-24 MED ORDER — PANTOPRAZOLE SODIUM 40 MG PO TBEC
80.0000 mg | DELAYED_RELEASE_TABLET | Freq: Every day | ORAL | Status: DC
  Administered 2017-04-25: 80 mg via ORAL
  Filled 2017-04-24: qty 2

## 2017-04-24 MED ORDER — CEFAZOLIN SODIUM-DEXTROSE 2-4 GM/100ML-% IV SOLN
2.0000 g | Freq: Four times a day (QID) | INTRAVENOUS | Status: AC
  Administered 2017-04-24 (×2): 2 g via INTRAVENOUS
  Filled 2017-04-24 (×2): qty 100

## 2017-04-24 SURGICAL SUPPLY — 49 items
BAG DECANTER FOR FLEXI CONT (MISCELLANEOUS) IMPLANT
BAG ZIPLOCK 12X15 (MISCELLANEOUS) IMPLANT
BALL HIP CERAMIC (Hips) ×1 IMPLANT
CAPT HIP TOTAL 2 ×3 IMPLANT
CHLORAPREP W/TINT 26ML (MISCELLANEOUS) ×3 IMPLANT
CLOTH BEACON ORANGE TIMEOUT ST (SAFETY) ×3 IMPLANT
COVER PERINEAL POST (MISCELLANEOUS) ×3 IMPLANT
COVER SURGICAL LIGHT HANDLE (MISCELLANEOUS) ×3 IMPLANT
DECANTER SPIKE VIAL GLASS SM (MISCELLANEOUS) ×3 IMPLANT
DERMABOND ADVANCED (GAUZE/BANDAGES/DRESSINGS) ×4
DERMABOND ADVANCED .7 DNX12 (GAUZE/BANDAGES/DRESSINGS) ×2 IMPLANT
DRAPE SHEET LG 3/4 BI-LAMINATE (DRAPES) ×9 IMPLANT
DRAPE STERI IOBAN 125X83 (DRAPES) ×3 IMPLANT
DRAPE U-SHAPE 47X51 STRL (DRAPES) ×6 IMPLANT
DRSG AQUACEL AG ADV 3.5X10 (GAUZE/BANDAGES/DRESSINGS) ×3 IMPLANT
ELECT PENCIL ROCKER SW 15FT (MISCELLANEOUS) ×3 IMPLANT
ELECT REM PT RETURN 15FT ADLT (MISCELLANEOUS) ×3 IMPLANT
GAUZE SPONGE 4X4 12PLY STRL (GAUZE/BANDAGES/DRESSINGS) ×3 IMPLANT
GLOVE BIO SURGEON STRL SZ8.5 (GLOVE) ×6 IMPLANT
GLOVE BIOGEL M STRL SZ7.5 (GLOVE) ×3 IMPLANT
GLOVE BIOGEL PI IND STRL 8.5 (GLOVE) ×1 IMPLANT
GLOVE BIOGEL PI INDICATOR 8.5 (GLOVE) ×2
GOWN SPEC L3 XXLG W/TWL (GOWN DISPOSABLE) ×3 IMPLANT
GOWN STRL REUS W/ TWL LRG LVL3 (GOWN DISPOSABLE) ×2 IMPLANT
GOWN STRL REUS W/ TWL XL LVL3 (GOWN DISPOSABLE) ×2 IMPLANT
GOWN STRL REUS W/TWL LRG LVL3 (GOWN DISPOSABLE) ×4
GOWN STRL REUS W/TWL XL LVL3 (GOWN DISPOSABLE) ×4
HANDPIECE INTERPULSE COAX TIP (DISPOSABLE) ×2
HIP BALL CERAMIC (Hips) ×3 IMPLANT
HOLDER FOLEY CATH W/STRAP (MISCELLANEOUS) ×3 IMPLANT
HOOD PEEL AWAY FLYTE STAYCOOL (MISCELLANEOUS) ×9 IMPLANT
MARKER SKIN DUAL TIP RULER LAB (MISCELLANEOUS) ×3 IMPLANT
NEEDLE SPNL 18GX3.5 QUINCKE PK (NEEDLE) ×3 IMPLANT
PACK ANTERIOR HIP CUSTOM (KITS) ×3 IMPLANT
SAW OSC TIP CART 19.5X105X1.3 (SAW) ×3 IMPLANT
SEALER BIPOLAR AQUA 6.0 (INSTRUMENTS) ×3 IMPLANT
SET HNDPC FAN SPRY TIP SCT (DISPOSABLE) ×1 IMPLANT
SUT ETHIBOND NAB CT1 #1 30IN (SUTURE) ×6 IMPLANT
SUT MNCRL AB 3-0 PS2 18 (SUTURE) ×3 IMPLANT
SUT MON AB 2-0 CT1 36 (SUTURE) ×6 IMPLANT
SUT STRATAFIX PDO 1 14 VIOLET (SUTURE) ×2
SUT STRATFX PDO 1 14 VIOLET (SUTURE) ×1
SUT VIC AB 2-0 CT1 27 (SUTURE) ×2
SUT VIC AB 2-0 CT1 TAPERPNT 27 (SUTURE) ×1 IMPLANT
SUTURE STRATFX PDO 1 14 VIOLET (SUTURE) ×1 IMPLANT
SYR 50ML LL SCALE MARK (SYRINGE) ×3 IMPLANT
TRAY FOLEY CATH 14FR (SET/KITS/TRAYS/PACK) ×3 IMPLANT
WATER STERILE IRR 1000ML POUR (IV SOLUTION) ×3 IMPLANT
YANKAUER SUCT BULB TIP 10FT TU (MISCELLANEOUS) ×3 IMPLANT

## 2017-04-24 NOTE — Anesthesia Postprocedure Evaluation (Signed)
Anesthesia Post Note  Patient: Cheryl Cooper  Procedure(s) Performed: RIGHT TOTAL HIP ARTHROPLASTY ANTERIOR APPROACH (Right Hip)     Patient location during evaluation: PACU Anesthesia Type: Spinal Level of consciousness: oriented and awake and alert Pain management: pain level controlled Vital Signs Assessment: post-procedure vital signs reviewed and stable Respiratory status: spontaneous breathing, respiratory function stable and patient connected to nasal cannula oxygen Cardiovascular status: blood pressure returned to baseline and stable Postop Assessment: no headache, no backache and no apparent nausea or vomiting Anesthetic complications: no    Last Vitals:  Vitals:   04/24/17 1223 04/24/17 1230  BP: (!) 114/54 107/69  Pulse: 64 62  Resp: 14 19  Temp:    SpO2: 96% 97%    Last Pain:  Vitals:   04/24/17 0818  TempSrc:   PainSc: 2                  Jolena Kittle,W. EDMOND

## 2017-04-24 NOTE — Discharge Instructions (Signed)
°Dr. Atleigh Gruen °Joint Replacement Specialist °[facility] °[address] °[place], [place] [address] °[phone] ° ° °TOTAL HIP REPLACEMENT POSTOPERATIVE DIRECTIONS ° ° ° °Hip Rehabilitation, Guidelines Following Surgery  ° °WEIGHT BEARING °Weight bearing as tolerated with assist device (walker, cane, etc) as directed, use it as long as suggested by your surgeon or therapist, typically at least 4-6 weeks. ° °The results of a hip operation are greatly improved after range of motion and muscle strengthening exercises. Follow all safety measures which are given to protect your hip. If any of these exercises cause increased pain or swelling in your joint, decrease the amount until you are comfortable again. Then slowly increase the exercises. Call your caregiver if you have problems or questions.  ° °HOME CARE INSTRUCTIONS  °Most of the following instructions are designed to prevent the dislocation of your new hip.  °Remove items at home which could result in a fall. This includes throw rugs or furniture in walking pathways.  °Continue medications as instructed at time of discharge. °· You may have some home medications which will be placed on hold until you complete the course of blood thinner medication. °· You may start showering once you are discharged home. Do not remove your dressing. °Do not put on socks or shoes without following the instructions of your caregivers.   °Sit on chairs with arms. Use the chair arms to help push yourself up when arising.  °Arrange for the use of a toilet seat elevator so you are not sitting low.  °· Walk with walker as instructed.  °You may resume a sexual relationship in one month or when given the OK by your caregiver.  °Use walker as long as suggested by your caregivers.  °You may put full weight on your legs and walk as much as is comfortable. °Avoid periods of inactivity such as sitting longer than an hour when not asleep. This helps prevent  blood clots.  °You may return to work once you are cleared by your surgeon.  °Do not drive a car for 6 weeks or until released by your surgeon.  °Do not drive while taking narcotics.  °Wear elastic stockings for two weeks following surgery during the day but you may remove then at night.  °Make sure you keep all of your appointments after your operation with all of your doctors and caregivers. You should call the office at the above phone number and make an appointment for approximately two weeks after the date of your surgery. °Please pick up a stool softener and laxative for home use as long as you are requiring pain medications. °· ICE to the affected hip every three hours for 30 minutes at a time and then as needed for pain and swelling. Continue to use ice on the hip for pain and swelling from surgery. You may notice swelling that will progress down to the foot and ankle.  This is normal after surgery.  Elevate the leg when you are not up walking on it.   °It is important for you to complete the blood thinner medication as prescribed by your doctor. °· Continue to use the breathing machine which will help keep your temperature down.  It is common for your temperature to cycle up and down following surgery, especially at night when you are not up moving around and exerting yourself.  The breathing machine keeps your lungs expanded and your temperature down. ° °RANGE OF MOTION AND STRENGTHENING EXERCISES  °These exercises are   designed to help you keep full movement of your hip joint. Follow your caregiver's or physical therapist's instructions. Perform all exercises about fifteen times, three times per day or as directed. Exercise both hips, even if you have had only one joint replacement. These exercises can be done on a training (exercise) mat, on the floor, on a table or on a bed. Use whatever works the best and is most comfortable for you. Use music or television while you are exercising so that the exercises  are a pleasant break in your day. This will make your life better with the exercises acting as a break in routine you can look forward to.  °Lying on your back, slowly slide your foot toward your buttocks, raising your knee up off the floor. Then slowly slide your foot back down until your leg is straight again.  °Lying on your back spread your legs as far apart as you can without causing discomfort.  °Lying on your side, raise your upper leg and foot straight up from the floor as far as is comfortable. Slowly lower the leg and repeat.  °Lying on your back, tighten up the muscle in the front of your thigh (quadriceps muscles). You can do this by keeping your leg straight and trying to raise your heel off the floor. This helps strengthen the largest muscle supporting your knee.  °Lying on your back, tighten up the muscles of your buttocks both with the legs straight and with the knee bent at a comfortable angle while keeping your heel on the floor.  ° °SKILLED REHAB INSTRUCTIONS: °If the patient is transferred to a skilled rehab facility following release from the hospital, a list of the current medications will be sent to the facility for the patient to continue.  When discharged from the skilled rehab facility, please have the facility set up the patient's Home Health Physical Therapy prior to being released. Also, the skilled facility will be responsible for providing the patient with their medications at time of release from the facility to include their pain medication and their blood thinner medication. If the patient is still at the rehab facility at time of the two week follow up appointment, the skilled rehab facility will also need to assist the patient in arranging follow up appointment in our office and any transportation needs. ° °MAKE SURE YOU:  °Understand these instructions.  °Will watch your condition.  °Will get help right away if you are not doing well or get worse. ° °Pick up stool softner and  laxative for home use following surgery while on pain medications. °Do not remove your dressing. °The dressing is waterproof--it is OK to take showers. °Continue to use ice for pain and swelling after surgery. °Do not use any lotions or creams on the incision until instructed by your surgeon. °Total Hip Protocol. ° ° °

## 2017-04-24 NOTE — Op Note (Signed)
OPERATIVE REPORT  SURGEON: Rod Can, MD   ASSISTANT: Nehemiah Massed, PA-C.  PREOPERATIVE DIAGNOSIS: Right hip arthritis / subchondral insufficiency fracture.   POSTOPERATIVE DIAGNOSIS: Right hip arthritis.   PROCEDURE: Right total hip arthroplasty, anterior approach / subchondral insufficiency fracture.   IMPLANTS: DePuy Tri Lock stem, size 3, std offset. DePuy Pinnacle Cup, size 52 mm. DePuy Altrx liner, size 32 by 52 mm, neutral. DePuy Biolox ceramic head ball, size 32 + 9 mm.  ANESTHESIA:  Spinal  ESTIMATED BLOOD LOSS:-300 mL    ANTIBIOTICS: 2 g Ancef.  DRAINS: None.  COMPLICATIONS: None.   CONDITION: PACU - hemodynamically stable.   BRIEF CLINICAL NOTE: Cheryl Cooper is a 77 y.o. female with a long-standing history of Right hip arthritis. After failing conservative management, the patient was indicated for total hip arthroplasty. The risks, benefits, and alternatives to the procedure were explained, and the patient elected to proceed.  PROCEDURE IN DETAIL: Surgical site was marked by myself in the pre-op holding area. Once inside the operating room, spinal anesthesia was obtained, and a foley catheter was inserted. The patient was then positioned on the Hana table. All bony prominences were well padded. The hip was prepped and draped in the normal sterile surgical fashion. A time-out was called verifying side and site of surgery. The patient received IV antibiotics within 60 minutes of beginning the procedure.  The direct anterior approach to the hip was performed through the Hueter interval. Lateral femoral circumflex vessels were treated with the Auqumantys. The anterior capsule was exposed and an inverted T capsulotomy was made.The femoral neck cut was made to the level of the templated cut. A corkscrew was placed into the head and the head was removed. The femoral head was found to have eburnated bone. The head was passed to the back table and was  measured.  Acetabular exposure was achieved, and the pulvinar and labrum were excised. Sequential reaming of the acetabulum was then performed up to a size 51 mm reamer. A 52 mm cup was then opened and impacted into place at approximately 40 degrees of abduction and 20 degrees of anteversion. The final polyethylene liner was impacted into place and acetabular osteophytes were removed.   I then gained femoral exposure taking care to protect the abductors and greater trochanter. This was performed using standard external rotation, extension, and adduction. The capsule was peeled off the inner aspect of the greater trochanter, taking care to preserve the short external rotators. A cookie cutter was used to enter the femoral canal, and then the femoral canal finder was placed. Sequential broaching was performed up to a size 3. Calcar planer was used on the femoral neck remnant. I placed a std offset neck and a trial head ball. The hip was reduced. Leg lengths and offset were checked fluoroscopically. The hip was dislocated and trial components were removed. The final implants were placed, and the hip was reduced.  Fluoroscopy was used to confirm component position and leg lengths. At 90 degrees of external rotation and full extension, the hip was stable to an anterior directed force.  The wound was copiously irrigated with normal saline using pulse lavage. Marcaine solution was injected into the periarticular soft tissue. The wound was closed in layers using #1 Vicryl and V-Loc for the fascia, 2-0 Vicryl for the subcutaneous fat, 2-0 Monocryl for the deep dermal layer, 3-0 running Monocryl subcuticular stitch, and Dermabond for the skin. Once the glue was fully dried, an Aquacell Ag dressing was applied.  The patient was transported to the recovery room in stable condition. Sponge, needle, and instrument counts were correct at the end of the case x2. The patient tolerated the procedure well and  there were no known complications.  Please note that a surgical assistant was a medical necessity for this procedure to perform it in a safe and expeditious manner. Assistant was necessary to provide appropriate retraction of vital neurovascular structures, to prevent femoral fracture, and to allow for anatomic placement of the prosthesis.

## 2017-04-24 NOTE — Anesthesia Procedure Notes (Addendum)
Procedure Name: MAC Date/Time: 04/24/2017 9:43 AM Performed by: West Pugh, CRNA Pre-anesthesia Checklist: Patient identified, Emergency Drugs available, Suction available, Patient being monitored and Timeout performed Patient Re-evaluated:Patient Re-evaluated prior to induction Oxygen Delivery Method: Simple face mask Placement Confirmation: positive ETCO2 Dental Injury: Teeth and Oropharynx as per pre-operative assessment

## 2017-04-24 NOTE — Anesthesia Procedure Notes (Addendum)
Spinal  Patient location during procedure: OR Start time: 04/24/2017 9:43 AM End time: 04/24/2017 9:52 AM Reason for block: at surgeon's request Staffing Resident/CRNA: West Pugh, CRNA Performed: resident/CRNA  Preanesthetic Checklist Completed: patient identified, site marked, surgical consent, pre-op evaluation, timeout performed, IV checked, risks and benefits discussed and monitors and equipment checked Spinal Block Patient position: sitting Prep: DuraPrep Patient monitoring: heart rate, continuous pulse ox, blood pressure and cardiac monitor Approach: right paramedian Location: L3-4 Injection technique: single-shot Needle Needle type: Pencan  Needle gauge: 24 G Needle length: 9 cm Assessment Sensory level: T4 Additional Notes Expiration of kit checked and confirmed. Patient tolerated procedure well,without complications  with noted clear CSF. Loss of motor and sensory on exam post injection.

## 2017-04-24 NOTE — Evaluation (Signed)
Physical Therapy Evaluation Patient Details Name: Cheryl Cooper MRN: [id number] DOB: 09-Feb-1941 Today's Date: 04/24/2017   History of Present Illness  77 yo female s/p R THA-direct anterior 04/24/17. Hx of Parkinson's, deaf L ear, DM, R RCR.   Clinical Impression  On eval POD 0, pt walked ~100 feet with a RW. Pain rated 4/10 with activity. Pt tolerated activity well. Will follow and progress activity as tolerated.     Follow Up Recommendations Follow surgeon's recommendation for DC plan and follow-up therapies    Equipment Recommendations  Other (comment)(pt is undecided if she wants a bsc)    Recommendations for Other Services       Precautions / Restrictions Precautions Precautions: Fall Restrictions Weight Bearing Restrictions: No RLE Weight Bearing: Weight bearing as tolerated      Mobility  Bed Mobility Overal bed mobility: Needs Assistance Bed Mobility: Supine to Sit     Supine to sit: Supervision     General bed mobility comments: for safety.   Transfers Overall transfer level: Needs assistance Equipment used: Rolling walker (2 wheeled) Transfers: Sit to/from Stand Sit to Stand: Min guard         General transfer comment: VCs safety, hand placement.   Ambulation/Gait Ambulation/Gait assistance: Min assist Ambulation Distance (Feet): 100 Feet Assistive device: Rolling walker (2 wheeled) Gait Pattern/deviations: Step-through pattern;Decreased stride length     General Gait Details: Intermittent assist to steady. Pt moves quickly, and a bit unsafe, at times.   Stairs            Wheelchair Mobility    Modified Rankin (Stroke Patients Only)       Balance                                             Pertinent Vitals/Pain Pain Assessment: 0-10 Pain Score: 4  Pain Location: R hip/LE Pain Descriptors / Indicators: Sore;Aching Pain Intervention(s): Monitored during session;Repositioned;Ice applied    Home Living  Family/patient expects to be discharged to:: Private residence Living Arrangements: Spouse/significant other Available Help at Discharge: Family Type of Home: House Home Access: Stairs to enter Entrance Stairs-Rails: Right Entrance Stairs-Number of Steps: 2 Home Layout: One level Home Equipment: Environmental consultant - 2 wheels;Walker - 4 wheels;Bedside commode      Prior Function Level of Independence: Independent               Hand Dominance        Extremity/Trunk Assessment   Upper Extremity Assessment Upper Extremity Assessment: Overall WFL for tasks assessed    Lower Extremity Assessment Lower Extremity Assessment: Generalized weakness(s/p R THA)    Cervical / Trunk Assessment Cervical / Trunk Assessment: Normal  Communication   Communication: No difficulties  Cognition Arousal/Alertness: Awake/alert Behavior During Therapy: WFL for tasks assessed/performed Overall Cognitive Status: Within Functional Limits for tasks assessed                                        General Comments      Exercises     Assessment/Plan    PT Assessment Patient needs continued PT services  PT Problem List Decreased strength;Decreased range of motion;Decreased balance;Decreased mobility;Decreased activity tolerance;Pain       PT Treatment Interventions DME instruction;Gait training;Functional mobility training;Therapeutic activities;Balance training;Patient/family education;Therapeutic exercise;Stair  training    PT Goals (Current goals can be found in the Care Plan section)  Acute Rehab PT Goals Patient Stated Goal: home PT Goal Formulation: With patient Time For Goal Achievement: 05/08/17 Potential to Achieve Goals: Good    Frequency 7X/week   Barriers to discharge        Co-evaluation               AM-PAC PT "6 Clicks" Daily Activity  Outcome Measure Difficulty turning over in bed (including adjusting bedclothes, sheets and blankets)?:  None Difficulty moving from lying on back to sitting on the side of the bed? : None Difficulty sitting down on and standing up from a chair with arms (e.g., wheelchair, bedside commode, etc,.)?: A Little Help needed moving to and from a bed to chair (including a wheelchair)?: A Little Help needed walking in hospital room?: A Little Help needed climbing 3-5 steps with a railing? : A Little 6 Click Score: 20    End of Session Equipment Utilized During Treatment: Gait belt Activity Tolerance: Patient tolerated treatment well Patient left: in chair;with call bell/phone within reach   PT Visit Diagnosis: Muscle weakness (generalized) (M62.81);Difficulty in walking, not elsewhere classified (R26.2)    Time: 3267-1245 PT Time Calculation (min) (ACUTE ONLY): 18 min   Charges:   PT Evaluation $PT Eval Low Complexity: 1 Low     PT G Codes:         Weston Anna, MPT Pager: [phone]

## 2017-04-24 NOTE — Interval H&P Note (Signed)
History and Physical Interval Note:  04/24/2017 9:34 AM  Cheryl Cooper  has presented today for surgery, with the diagnosis of Degenerative joint disease right hip  The various methods of treatment have been discussed with the patient and family. After consideration of risks, benefits and other options for treatment, the patient has consented to  Procedure(s) with comments: RIGHT TOTAL HIP ARTHROPLASTY ANTERIOR APPROACH (Right) - Needs RNFA as a surgical intervention .  The patient's history has been reviewed, patient examined, no change in status, stable for surgery.  I have reviewed the patient's chart and labs.  Questions were answered to the patient's satisfaction.     Hilton Cork Tai Syfert

## 2017-04-24 NOTE — Transfer of Care (Signed)
Immediate Anesthesia Transfer of Care Note  Patient: Cheryl Cooper  Procedure(s) Performed: Procedure(s) with comments: RIGHT TOTAL HIP ARTHROPLASTY ANTERIOR APPROACH (Right) - Needs RNFA  Patient Location: PACU  Anesthesia Type:MAC and Spinal  Level of Consciousness: Patient easily awoken, sedated, comfortable, cooperative, following commands, responds to stimulation.   Airway & Oxygen Therapy: Patient spontaneously breathing, ventilating well, oxygen via simple oxygen mask.  Post-op Assessment: Report given to PACU RN, vital signs reviewed and stable.   Post vital signs: Reviewed and stable.  Complications: No apparent anesthesia complications  Last Vitals:  Vitals:   04/24/17 0753  BP: 138/75  Pulse: (!) 55  Resp: 18  Temp: (!) 36.4 C  SpO2: 95%    Last Pain:  Vitals:   04/24/17 0818  TempSrc:   PainSc: 2       Patients Stated Pain Goal: 4 (01/60/10 9323)  Complications: No apparent anesthesia complications

## 2017-04-24 NOTE — Anesthesia Preprocedure Evaluation (Addendum)
Anesthesia Evaluation  Patient identified by MRN, date of birth, ID band Patient awake    Reviewed: Allergy & Precautions, H&P , NPO status , Patient's Chart, lab work & pertinent test results  History of Anesthesia Complications (+) PONV  Airway Mallampati: IV  TM Distance: >3 FB Neck ROM: Full    Dental no notable dental hx. (+) Teeth Intact, Dental Advisory Given   Pulmonary neg pulmonary ROS,    Pulmonary exam normal breath sounds clear to auscultation       Cardiovascular hypertension, Pt. on medications  Rhythm:Regular Rate:Normal     Neuro/Psych negative neurological ROS  negative psych ROS   GI/Hepatic Neg liver ROS, GERD  Medicated and Controlled,  Endo/Other  diabetesHypothyroidism   Renal/GU negative Renal ROS  negative genitourinary   Musculoskeletal   Abdominal   Peds  Hematology negative hematology ROS (+)   Anesthesia Other Findings   Reproductive/Obstetrics negative OB ROS                            Anesthesia Physical Anesthesia Plan  ASA: II  Anesthesia Plan: Spinal   Post-op Pain Management:    Induction: Intravenous  PONV Risk Score and Plan: 4 or greater and Ondansetron, Propofol infusion, Dexamethasone and Treatment may vary due to age or medical condition  Airway Management Planned: Simple Face Mask  Additional Equipment:   Intra-op Plan:   Post-operative Plan:   Informed Consent: I have reviewed the patients History and Physical, chart, labs and discussed the procedure including the risks, benefits and alternatives for the proposed anesthesia with the patient or authorized representative who has indicated his/her understanding and acceptance.   Dental advisory given  Plan Discussed with: CRNA  Anesthesia Plan Comments:         Anesthesia Quick Evaluation

## 2017-04-25 LAB — BASIC METABOLIC PANEL
Anion gap: 6 (ref 5–15)
BUN: 10 mg/dL (ref 6–20)
CHLORIDE: 108 mmol/L (ref 101–111)
CO2: 24 mmol/L (ref 22–32)
Calcium: 8.1 mg/dL — ABNORMAL LOW (ref 8.9–10.3)
Creatinine, Ser: 0.76 mg/dL (ref 0.44–1.00)
GFR calc non Af Amer: >60 mL/min (ref >60)
Glucose, Bld: 206 mg/dL — ABNORMAL HIGH (ref 65–99)
Potassium: 4.6 mmol/L (ref 3.5–5.1)
SODIUM: 138 mmol/L (ref 135–145)

## 2017-04-25 LAB — CBC
HCT: 30.7 % — ABNORMAL LOW (ref 36.0–46.0)
HEMOGLOBIN: 9.9 g/dL — AB (ref 12.0–15.0)
MCH: 28.3 pg (ref 26.0–34.0)
MCHC: 32.2 g/dL (ref 30.0–36.0)
MCV: 87.7 fL (ref 78.0–100.0)
PLATELETS: 152 10*3/uL (ref 150–400)
RBC: 3.5 MIL/uL — ABNORMAL LOW (ref 3.87–5.11)
RDW: 15.5 % (ref 11.5–15.5)
WBC: 15.6 10*3/uL — AB (ref 4.0–10.5)

## 2017-04-25 MED ORDER — HYDROCODONE-ACETAMINOPHEN 5-325 MG PO TABS: 1.0000 | ORAL_TABLET | ORAL | 0 refills | Status: DC | PRN

## 2017-04-25 MED ORDER — ASPIRIN 81 MG PO CHEW: 81.0000 mg | CHEWABLE_TABLET | Freq: Two times a day (BID) | ORAL | 1 refills | Status: DC

## 2017-04-25 MED ORDER — DOCUSATE SODIUM 100 MG PO CAPS: 100.0000 mg | ORAL_CAPSULE | Freq: Two times a day (BID) | ORAL | 1 refills | Status: DC

## 2017-04-25 MED ORDER — ONDANSETRON HCL 4 MG PO TABS: 4.0000 mg | ORAL_TABLET | Freq: Four times a day (QID) | ORAL | 0 refills | Status: DC | PRN

## 2017-04-25 MED ORDER — SENNA 8.6 MG PO TABS: 2.0000 | ORAL_TABLET | Freq: Every day | ORAL | 3 refills | Status: DC

## 2017-04-25 NOTE — Progress Notes (Signed)
Spoke with patient at bedside. Confirmed plan for HEP, already arranged. Has RW and 3n1. [phone] 

## 2017-04-25 NOTE — Discharge Summary (Signed)
Physician Discharge Summary  Patient ID: Cheryl Cooper MRN: [id number] DOB/AGE: [birth year] 77 y.o.  Admit date: 04/24/2017 Discharge date: 04/25/2017  Admission Diagnoses:  Osteoarthritis of right hip  Discharge Diagnoses:  Principal Problem:   Osteoarthritis of right hip Active Problems:   Primary osteoarthritis of right hip   Past Medical History:  Diagnosis Date  . Deafness in left ear   . Diabetes mellitus    diet controlled  . GERD (gastroesophageal reflux disease)   . Hypertension   . Hypothyroid   . Parkinson's disease [facility])    "my doctor says i have a form of parkinsons the middle one that affects you balance"  . PONV (postoperative nausea and vomiting) yrs ago, none recent    Surgeries: Procedure(s): RIGHT TOTAL HIP ARTHROPLASTY ANTERIOR APPROACH on 04/24/2017   Consultants (if any):   Discharged Condition: Improved  Hospital Course: Cheryl Cooper is an 77 y.o. female who was admitted 04/24/2017 with a diagnosis of Osteoarthritis of right hip and went to the operating room on 04/24/2017 and underwent the above named procedures.    She was given perioperative antibiotics:  Anti-infectives (From admission, onward)   Start     Dose/Rate Route Frequency Ordered Stop   04/24/17 1600  ceFAZolin (ANCEF) IVPB 2g/100 mL premix     2 g 200 mL/hr over 30 Minutes Intravenous Every 6 hours 04/24/17 1307 04/24/17 2157   04/24/17 0743  ceFAZolin (ANCEF) IVPB 2g/100 mL premix     2 g 200 mL/hr over 30 Minutes Intravenous On call to O.R. 04/24/17 8563 04/24/17 1023    .  She was given sequential compression devices, early ambulation, and ASA for DVT prophylaxis.  She benefited maximally from the hospital stay and there were no complications.    Recent vital signs:  Vitals:   04/25/17 1021 04/25/17 1410  BP: 139/68 (!) 143/57  Pulse: 94 96  Resp: 16 15  Temp: 98.4 F (36.9 C) 98.4 F (36.9 C)  SpO2: 96%     Recent laboratory studies:  Lab Results  Component Value  Date   HGB 9.9 (L) 04/25/2017   HGB 14.6 11/18/2011   HGB 15.9 (H) 12/12/2008   Lab Results  Component Value Date   WBC 15.6 (H) 04/25/2017   PLT 152 04/25/2017   Lab Results  Component Value Date   INR 0.96 12/12/2008   Lab Results  Component Value Date   NA 138 04/25/2017   K 4.6 04/25/2017   CL 108 04/25/2017   CO2 24 04/25/2017   BUN 10 04/25/2017   CREATININE 0.76 04/25/2017   GLUCOSE 206 (H) 04/25/2017    Discharge Medications:   Allergies as of 04/25/2017      Reactions   Atorvastatin Other (See Comments)   Unbalanced   Chlordiazepoxide-clidinium    Dizziness (intolerance)   Codeine Nausea Only   Other    Tomato sauce, garlic, onion - severe acid reflux       Medication List    STOP taking these medications   acetaminophen 500 MG tablet Commonly known as:  TYLENOL   alendronate 70 MG tablet Commonly known as:  FOSAMAX   aspirin EC 81 MG tablet Replaced by:  aspirin 81 MG chewable tablet     TAKE these medications   amLODipine 5 MG tablet Commonly known as:  NORVASC Take 5 mg by mouth daily.   aspirin 81 MG chewable tablet Chew 1 tablet (81 mg total) by mouth 2 (two) times daily. Replaces:  aspirin EC 81 MG tablet   CORICIDIN HBP CONGESTION/COUGH PO Take 1 tablet by mouth 2 (two) times daily as needed (congestion).   docusate sodium 100 MG capsule Commonly known as:  COLACE Take 1 capsule (100 mg total) by mouth 2 (two) times daily.   escitalopram 10 MG tablet Commonly known as:  LEXAPRO Take 10 mg by mouth daily.   esomeprazole 40 MG capsule Commonly known as:  NEXIUM Take 40 mg by mouth 2 (two) times daily.   ezetimibe-simvastatin 10-80 MG tablet Commonly known as:  VYTORIN Take 1 tablet by mouth at bedtime.   HYDROcodone-acetaminophen 5-325 MG tablet Commonly known as:  NORCO/VICODIN Take 1-2 tablets by mouth every 4 (four) hours as needed.   lamoTRIgine 100 MG tablet Commonly known as:  LAMICTAL Take 100 mg by mouth daily.    levothyroxine 75 MCG tablet Commonly known as:  SYNTHROID, LEVOTHROID Take 75 mcg by mouth daily with breakfast.   multivitamin tablet Take 2 tablets by mouth daily. Women's Multivitamin (Gummiest)   ondansetron 4 MG tablet Commonly known as:  ZOFRAN Take 1 tablet (4 mg total) by mouth every 6 (six) hours as needed for nausea.   oxybutynin 10 MG 24 hr tablet Commonly known as:  DITROPAN-XL Take 10 mg by mouth daily.   senna 8.6 MG Tabs tablet Commonly known as:  SENOKOT Take 2 tablets (17.2 mg total) by mouth at bedtime.   SUPER B COMPLEX/C PO Take 1 tablet by mouth daily.   VIACTIV [id number] MG-UNT-MCG Chew Generic drug:  Calcium-Vitamin D-Vitamin K Chew 2 tablets by mouth daily.   Vitamin D3 2000 units capsule Take 2,000 Units by mouth daily.       Diagnostic Studies: Dg Pelvis Portable  Result Date: 04/24/2017 CLINICAL DATA:  Right total hip arthroplasty. EXAM: PORTABLE PELVIS 1-2 VIEWS COMPARISON:  04/24/2017. FINDINGS: Right total hip arthroplasty. Subcutaneous and joint air are seen in association. Left hip is unremarkable. Sacroiliac joints are patent. Degenerative changes are seen at the L5-S1 junction. IMPRESSION: Right total hip arthroplasty with expected postoperative findings. Electronically Signed   By: Lorin Picket M.D.   On: 04/24/2017 12:37   Dg C-arm 1-60 Min-no Report  Result Date: 04/24/2017 Fluoroscopy was utilized by the requesting physician.  No radiographic interpretation.   Dg Hip Operative Unilat W Or W/o Pelvis Right  Result Date: 04/24/2017 CLINICAL DATA:  Intraoperative right anterior hip replacement images. Reported fluoro time is 15.9 seconds with dose of 1.04 mGy EXAM: OPERATIVE right HIP (WITH PELVIS IF PERFORMED) 3 VIEWS TECHNIQUE: Fluoroscopic spot image(s) were submitted for interpretation post-operatively. COMPARISON:  None in PACs FINDINGS: The patient has undergone right total hip joint prosthesis placement. Radiographic positioning  of the prosthetic components is good. The interface with the native bone appears normal. IMPRESSION: No immediate postprocedure complication following right total hip joint prosthesis placement. Electronically Signed   By: David  Martinique M.D.   On: 04/24/2017 11:40    Disposition: 01-Home or Self Care  Discharge Instructions    Call MD / Call 911   Complete by:  As directed    If you experience chest pain or shortness of breath, CALL 911 and be transported to the hospital emergency room.  If you develope a fever above 101 F, pus (white drainage) or increased drainage or redness at the wound, or calf pain, call your surgeon's office.   Constipation Prevention   Complete by:  As directed    Drink plenty of fluids.  Prune juice may  be helpful.  You may use a stool softener, such as Colace (over the counter) 100 mg twice a day.  Use MiraLax (over the counter) for constipation as needed.   Diet - low sodium heart healthy   Complete by:  As directed    Driving restrictions   Complete by:  As directed    No driving for 6 weeks   Increase activity slowly as tolerated   Complete by:  As directed    Lifting restrictions   Complete by:  As directed    No lifting for 6 weeks   TED hose   Complete by:  As directed    Use stockings (TED hose) for 2 weeks on both leg(s).  You may remove them at night for sleeping.      Follow-up Information    Saquoia Sianez, Aaron Edelman, MD. Schedule an appointment as soon as possible for a visit in 2 weeks.   Specialty:  Orthopedic Surgery Why:  For wound re-check Contact information: [address] [place] [address] [phone]            Signed: Hilton Cork Jevaun Strick 04/25/2017, 4:43 PM

## 2017-04-25 NOTE — Progress Notes (Signed)
    Subjective:  Patient reports pain as mild to moderate.  Denies N/V/CP/SOB.   Objective:   VITALS:   Vitals:   04/24/17 1604 04/24/17 2123 04/25/17 0150 04/25/17 0627  BP: 110/60 (!) 110/51 126/62 130/70  Pulse: 76 81  74  Resp: 16 17 16 17   Temp: 97.6 F (36.4 C) 98.5 F (36.9 C) 98.6 F (37 C) 98.4 F (36.9 C)  TempSrc: Oral Oral Oral Oral  SpO2: 97% 92% 92% 95%  Weight:      Height:        NAD Sensation intact distally Intact pulses distally Dorsiflexion/Plantar flexion intact Incision: dressing C/D/I Compartment soft   Lab Results  Component Value Date   WBC 15.6 (H) 04/25/2017   HGB 9.9 (L) 04/25/2017   HCT 30.7 (L) 04/25/2017   MCV 87.7 04/25/2017   PLT 152 04/25/2017   BMET    Component Value Date/Time   NA 138 04/25/2017 0559   K 4.6 04/25/2017 0559   CL 108 04/25/2017 0559   CO2 24 04/25/2017 0559   GLUCOSE 206 (H) 04/25/2017 0559   BUN 10 04/25/2017 0559   CREATININE 0.76 04/25/2017 0559   CALCIUM 8.1 (L) 04/25/2017 0559   GFRNONAA >60 04/25/2017 0559   GFRAA >60 04/25/2017 0559     Assessment/Plan: 1 Day Post-Op   Principal Problem:   Osteoarthritis of right hip Active Problems:   Primary osteoarthritis of right hip   WBAT with walker DVT ppx: ASA, SCDs, TEDS PO pain control PT/OT Dispo: D/C home with HEP   Cheryl Cooper 04/25/2017, 8:05 AM   Rod Can, MD Cell [phone]

## 2017-04-25 NOTE — Progress Notes (Signed)
Physical Therapy Treatment Patient Details Name: Cheryl Cooper MRN: [id number] DOB: 08/01/1940 Today's Date: 04/25/2017    History of Present Illness 76 yo female s/p R THA-direct anterior 04/24/17. Hx of Parkinson's, deaf L ear, DM, R RCR.     PT Comments    POD # 1 pm session.  Assisted with amb in hallway and performed all THR TE's following handout HEP.   Pt has met goals to D/C to home.   Follow Up Recommendations  Follow surgeon's recommendation for DC plan and follow-up therapies;No PT follow up(HEP)     Equipment Recommendations  None recommended by PT    Recommendations for Other Services       Precautions / Restrictions Precautions Precautions: Fall Restrictions Weight Bearing Restrictions: No RLE Weight Bearing: Weight bearing as tolerated    Mobility  Bed Mobility Overal bed mobility: Modified Independent Bed Mobility: Supine to Sit     Supine to sit: Supervision     General bed mobility comments: able with increased time  Transfers Overall transfer level: Needs assistance Equipment used: Rolling walker (2 wheeled) Transfers: Sit to/from Stand Sit to Stand: Supervision         General transfer comment: one VCs safety, hand placement.   Ambulation/Gait Ambulation/Gait assistance: Supervision Ambulation Distance (Feet): 85 Feet Assistive device: Rolling walker (2 wheeled) Gait Pattern/deviations: Step-through pattern;Decreased stride length Gait velocity: WFL   General Gait Details: good alternating gait and functional gait speed.  One VC safety with turns.    Stairs Stairs: Yes   Stair Management: Step to pattern;Forwards;One rail Left Number of Stairs: 2 General stair comments: VCs safety, technique, sequence. Close guard for safety.   Wheelchair Mobility    Modified Rankin (Stroke Patients Only)       Balance                                            Cognition Arousal/Alertness: Awake/alert Behavior  During Therapy: WFL for tasks assessed/performed Overall Cognitive Status: Within Functional Limits for tasks assessed                                        Exercises Total Hip Replacement TE's 10 reps ankle pumps 10 reps knee presses 10 reps heel slides 10 reps SAQ's 10 reps ABD 10 reps standing TE's  Followed by ICE   General Comments        Pertinent Vitals/Pain Pain Assessment: 0-10 Pain Score: 3  Pain Location: R hip/LE Pain Descriptors / Indicators: Sore;Aching;Operative site guarding Pain Intervention(s): Premedicated before session;Monitored during session;Repositioned;Ice applied    Home Living                      Prior Function            PT Goals (current goals can now be found in the care plan section) Progress towards PT goals: Progressing toward goals    Frequency    7X/week      PT Plan Current plan remains appropriate    Co-evaluation              AM-PAC PT "6 Clicks" Daily Activity  Outcome Measure  Difficulty turning over in bed (including adjusting bedclothes, sheets and blankets)?: None Difficulty moving from lying on back   to sitting on the side of the bed? : None Difficulty sitting down on and standing up from a chair with arms (e.g., wheelchair, bedside commode, etc,.)?: None Help needed moving to and from a bed to chair (including a wheelchair)?: A Little Help needed walking in hospital room?: A Little Help needed climbing 3-5 steps with a railing? : A Little 6 Click Score: 21    End of Session Equipment Utilized During Treatment: Gait belt Activity Tolerance: Patient tolerated treatment well Patient left: in bed Nurse Communication: (pt has met goals to D/C to home ) PT Visit Diagnosis: Muscle weakness (generalized) (M62.81);Difficulty in walking, not elsewhere classified (R26.2)     Time: 1418-1441 PT Time Calculation (min) (ACUTE ONLY): 23 min  Charges:  $Gait Training: 8-22  mins $Therapeutic Exercise: 8-22 mins                    G Codes:       {   PTA [facility]  Acute  Rehab Pager      [phone]  

## 2017-04-25 NOTE — Progress Notes (Signed)
Physical Therapy Treatment Patient Details Name: Cheryl Cooper MRN: [id number] DOB: 11-18-1940 Today's Date: 04/25/2017    History of Present Illness 77 yo female s/p R THA-direct anterior 04/24/17. Hx of Parkinson's, deaf L ear, DM, R RCR.     PT Comments    Progressing with mobility. Plan for d/c later today.   Follow Up Recommendations  Follow surgeon's recommendation for DC plan and follow-up therapies     Equipment Recommendations  None recommended by PT    Recommendations for Other Services       Precautions / Restrictions Precautions Precautions: Fall Restrictions Weight Bearing Restrictions: No RLE Weight Bearing: Weight bearing as tolerated    Mobility  Bed Mobility Overal bed mobility: Needs Assistance Bed Mobility: Supine to Sit     Supine to sit: Supervision     General bed mobility comments: for safety.   Transfers Overall transfer level: Needs assistance Equipment used: Rolling walker (2 wheeled) Transfers: Sit to/from Stand Sit to Stand: Supervision         General transfer comment: VCs safety, hand placement.   Ambulation/Gait Ambulation/Gait assistance: Min guard Ambulation Distance (Feet): 150 Feet Assistive device: Rolling walker (2 wheeled) Gait Pattern/deviations: Step-through pattern     General Gait Details: Close guard for safety.   Stairs Stairs: Yes Min guard assist  Stair Management: Step to pattern;Forwards;One rail Left Number of Stairs: 2 General stair comments: VCs safety, technique, sequence. Close guard for safety.   Wheelchair Mobility    Modified Rankin (Stroke Patients Only)       Balance                                            Cognition Arousal/Alertness: Awake/alert Behavior During Therapy: WFL for tasks assessed/performed Overall Cognitive Status: Within Functional Limits for tasks assessed                                        Exercises Total Joint  Exercises Ankle Circles/Pumps: AROM;Both;10 reps;Supine Quad Sets: AROM;Both;10 reps;Supine Heel Slides: AROM;Right;10 reps;Supine Hip ABduction/ADduction: AROM;Right;10 reps;Supine    General Comments        Pertinent Vitals/Pain Pain Assessment: 0-10 Pain Score: 4  Pain Location: R hip/LE Pain Descriptors / Indicators: Sore;Aching Pain Intervention(s): Monitored during session;Repositioned;Ice applied    Home Living                      Prior Function            PT Goals (current goals can now be found in the care plan section) Progress towards PT goals: Progressing toward goals    Frequency    7X/week      PT Plan Current plan remains appropriate    Co-evaluation              AM-PAC PT "6 Clicks" Daily Activity  Outcome Measure  Difficulty turning over in bed (including adjusting bedclothes, sheets and blankets)?: None Difficulty moving from lying on back to sitting on the side of the bed? : None Difficulty sitting down on and standing up from a chair with arms (e.g., wheelchair, bedside commode, etc,.)?: A Little Help needed moving to and from a bed to chair (including a wheelchair)?: A Little Help needed walking in hospital room?:  A Little Help needed climbing 3-5 steps with a railing? : A Little 6 Click Score: 20    End of Session Equipment Utilized During Treatment: Gait belt Activity Tolerance: Patient tolerated treatment well Patient left: in bed;with call bell/phone within reach;with bed alarm set   PT Visit Diagnosis: Muscle weakness (generalized) (M62.81);Difficulty in walking, not elsewhere classified (R26.2)     Time: 0300-9233 PT Time Calculation (min) (ACUTE ONLY): 20 min  Charges:  $Gait Training: 8-22 mins                    G Codes:          Weston Anna, MPT Pager: [phone]

## 2017-04-28 ENCOUNTER — Ambulatory Visit: Payer: Medicare Other | Admitting: Family Medicine

## 2017-06-17 ENCOUNTER — Encounter: Payer: Self-pay | Admitting: Family Medicine

## 2017-06-17 ENCOUNTER — Ambulatory Visit: Payer: Medicare Other | Admitting: Family Medicine

## 2017-06-17 VITALS — BP 132/58 | HR 66 | Temp 98.0°F | Ht 64.0 in | Wt 159.4 lb

## 2017-06-17 DIAGNOSIS — F0781 Postconcussional syndrome: Secondary | ICD-10-CM | POA: Diagnosis not present

## 2017-06-17 DIAGNOSIS — G4700 Insomnia, unspecified: Secondary | ICD-10-CM

## 2017-06-17 MED ORDER — HYDROXYZINE HCL 10 MG PO TABS: 10.0000 mg | ORAL_TABLET | Freq: Every evening | ORAL | 0 refills | Status: DC | PRN

## 2017-06-17 NOTE — Progress Notes (Signed)
Subjective:  By signing my name below, I, Moises Blood, attest that this documentation has been prepared under the direction and in the presence of Merri Ray, MD. Electronically Signed: Moises Blood, [place]. 06/17/2017 , 4:09 PM .  Patient was seen in Room 1 .   Patient ID: Cheryl Cooper, female    DOB: August 21, 1940, 77 y.o.   MRN: [id number] Chief Complaint  Patient presents with  . Establish Care    unable to sleep   HPI Cheryl Cooper is a 77 y.o. female  Here for sleep difficulty. She is a new patient to me. Her PCP is Dr. Suzy Bouchard, Barbarann Ehlers, DO. She has a history of brain stem stroke and post concussion syndrome per problem list. Patient has been seen by neurologist, Dr. Leta Baptist, last seen in Dec 2017. At that time, she had progressive gait and balance difficulty, question of possible Parkinsonism versus balance issues from brain stem stroke sequelae. Appears she has had relatively recent visit on March 7th by Dr. Lyla Glassing with a right total hip arthroplasty. Phone note April 11th noted from [facility] on [facility] requesting refill of klonopin. Appears by that note, office visit was requested but patient declined appointment on the 11th as symptoms had improved from coming off cold [place] with hydrocodone and worsening anxiety. Did review multiple outside and previous records.   Patient requests medication refill of her klonopin 0.5mg . Previously, her OBGYN, Dr. Kris Mouton, prescribed it for her, but he retired a year ago. She was taking klonopin at night. She talked to her PCP (Dr. Suzy Bouchard) about it, but wouldn't prescribe klonopin for her as her she is taking Lamictal. She hasn't taken klonopin lately, and hasn't taken it in a long time. She's tried OTC melatonin and tylenol-PM, but the tylenol made her jittery. She denies currently followed by psychologist; Dr. Suzy Bouchard is prescribing her Lamictal and Lexapro.   She denies balance issues, confusion or  dizziness recently. She states these symptoms were from the concussion. She describes having trouble getting to sleep. She hasn't tried vistaril and hydroxyzine. She denies having a TV in her bedroom. She drinks an occasional caffeine in the afternoon. She started riding her bicycle in the mornings after hip replacement. She denies reading in bed. She has a clock visible to her when in bed. She usually takes a shower at 7:00PM, and usually sleeps at 10-11:00PM. In a typical night, she watches TV between shower and sleep. She's never tried drinking tea before bed time. She denies SI. She denies waking up throughout the night.   Patient Active Problem List   Diagnosis Date Noted  . Osteoarthritis of right hip 04/24/2017  . Primary osteoarthritis of right hip 04/24/2017  . Complete rotator cuff tear 05/09/2016  . Brainstem stroke ([place]) 01/04/2015  . Dizziness and giddiness 01/04/2015  . Post concussion syndrome 01/04/2015  . Rotator cuff tear, right 11/21/2011   Past Medical History:  Diagnosis Date  . Deafness in left ear   . Diabetes mellitus    diet controlled  . GERD (gastroesophageal reflux disease)   . Hypertension   . Hypothyroid   . Parkinson's disease [facility])    "my doctor says i have a form of parkinsons the middle one that affects you balance"  . PONV (postoperative nausea and vomiting) yrs ago, none recent   Past Surgical History:  Procedure Laterality Date  . ABDOMINAL HYSTERECTOMY  1986  . BACK SURGERY  2010   lower  . left  elobow surgery  2003  . left rotator cuff  2001  . r foot surgery  1995  . right hand surgery  2001  . right index finger surgery  2009  . SHOULDER OPEN ROTATOR CUFF REPAIR  11/21/2011   Procedure: ROTATOR CUFF REPAIR SHOULDER OPEN;  Surgeon: Johnn Hai, MD;  Location: [facility];  Service: Orthopedics;  Laterality: Right;  with subacromial decompression  . SHOULDER OPEN ROTATOR CUFF REPAIR Right 05/09/2016   Procedure: Right shoulder mini open  revision rotator cuff repair, subacromial decompression;  Surgeon: Susa Day, MD;  Location: [facility];  Service: Orthopedics;  Laterality: Right;  . TOTAL HIP ARTHROPLASTY Right 04/24/2017   Procedure: RIGHT TOTAL HIP ARTHROPLASTY ANTERIOR APPROACH;  Surgeon: Rod Can, MD;  Location: [facility];  Service: Orthopedics;  Laterality: Right;  Needs RNFA   Allergies  Allergen Reactions  . Atorvastatin Other (See Comments)    Unbalanced  . Chlordiazepoxide-Clidinium     Dizziness (intolerance)  . Codeine Nausea Only  . Other     Tomato sauce, garlic, onion - severe acid reflux    Prior to Admission medications   Medication Sig Start Date End Date Taking? Authorizing Provider  amLODipine (NORVASC) 5 MG tablet Take 5 mg by mouth daily.  04/05/15   [provider]  aspirin 81 MG chewable tablet Chew 1 tablet (81 mg total) by mouth 2 (two) times daily. 04/25/17   Swinteck, Aaron Edelman, MD  Calcium-Vitamin D-Vitamin K (VIACTIV) [id number] MG-UNT-MCG CHEW Chew 2 tablets by mouth daily.    [provider]  Cholecalciferol (VITAMIN D3) 2000 units capsule Take 2,000 Units by mouth daily.    [provider]  Dextromethorphan-Guaifenesin (CORICIDIN HBP CONGESTION/COUGH PO) Take 1 tablet by mouth 2 (two) times daily as needed (congestion).    [provider]  docusate sodium (COLACE) 100 MG capsule Take 1 capsule (100 mg total) by mouth 2 (two) times daily. 04/25/17   Swinteck, Aaron Edelman, MD  escitalopram (LEXAPRO) 10 MG tablet Take 10 mg by mouth daily.  09/03/14   [provider]  esomeprazole (NEXIUM) 40 MG capsule Take 40 mg by mouth 2 (two) times daily.  09/24/14   [provider]  ezetimibe-simvastatin (VYTORIN) 10-80 MG per tablet Take 1 tablet by mouth at bedtime.    [provider]  HYDROcodone-acetaminophen (NORCO/VICODIN) 5-325 MG tablet Take 1-2 tablets by mouth every 4 (four) hours as needed. 04/25/17   Swinteck, Aaron Edelman, MD  lamoTRIgine (LAMICTAL) 100  MG tablet Take 100 mg by mouth daily.     [provider]  levothyroxine (SYNTHROID, LEVOTHROID) 75 MCG tablet Take 75 mcg by mouth daily with breakfast.    [provider]  Multiple Vitamin (MULTIVITAMIN) tablet Take 2 tablets by mouth daily. Women's Multivitamin (Gummiest)    [provider]  ondansetron (ZOFRAN) 4 MG tablet Take 1 tablet (4 mg total) by mouth every 6 (six) hours as needed for nausea. 04/25/17   Swinteck, Aaron Edelman, MD  oxybutynin (DITROPAN-XL) 10 MG 24 hr tablet Take 10 mg by mouth daily.  11/06/14   [provider]  senna (SENOKOT) 8.6 MG TABS tablet Take 2 tablets (17.2 mg total) by mouth at bedtime. 04/25/17   Swinteck, Aaron Edelman, MD  SUPER B COMPLEX/C PO Take 1 tablet by mouth daily.    [provider]   Social History   Socioeconomic History  . Marital status: Married    Spouse name: Herschel  . Number of children: 3  . Years of education:  14  . Highest education level: Not on file  Occupational History    Comment: retired  Scientific laboratory technician  . Financial resource strain: Not on file  . Food insecurity:    Worry: Not on file    Inability: Not on file  . Transportation needs:    Medical: Not on file    Non-medical: Not on file  Tobacco Use  . Smoking status: Never Smoker  . Smokeless tobacco: Never Used  Substance and Sexual Activity  . Alcohol use: Yes    Comment: glass wine/day  . Drug use: No  . Sexual activity: Not on file  Lifestyle  . Physical activity:    Days per week: Not on file    Minutes per session: Not on file  . Stress: Not on file  Relationships  . Social connections:    Talks on phone: Not on file    Gets together: Not on file    Attends religious service: Not on file    Active member of club or organization: Not on file    Attends meetings of clubs or organizations: Not on file    Relationship status: Not on file  . Intimate partner violence:    Fear of current or ex partner: Not on file     Emotionally abused: Not on file    Physically abused: Not on file    Forced sexual activity: Not on file  Other Topics Concern  . Not on file  Social History Narrative   Married, lives at home with husband   caffeine - 1 Coke daily   Review of Systems  Constitutional: Negative for chills, fatigue, fever and unexpected weight change.  Respiratory: Negative for cough.   Gastrointestinal: Negative for constipation, diarrhea, nausea and vomiting.  Skin: Negative for rash and wound.  Neurological: Negative for dizziness, weakness and headaches.  Psychiatric/Behavioral: Positive for sleep disturbance. Negative for confusion, self-injury and suicidal ideas. The patient is not nervous/anxious.        Objective:   Physical Exam  Constitutional: She is oriented to person, place, and time. She appears well-developed and well-nourished. No distress.  HENT:  Head: Normocephalic and atraumatic.  Eyes: Pupils are equal, round, and reactive to light. EOM are normal.  Neck: Neck supple.  Cardiovascular: Normal rate.  Pulmonary/Chest: Effort normal. No respiratory distress.  Musculoskeletal: Normal range of motion.  Neurological: She is alert and oriented to person, place, and time.  Skin: Skin is warm and dry.  Psychiatric: She has a normal mood and affect. Her behavior is normal.  Nursing note and vitals reviewed.   Vitals:   06/17/17 1524  BP: (!) 132/58  Pulse: 66  Temp: 98 F (36.7 C)  TempSrc: Oral  SpO2: 93%  Weight: 159 lb 6.4 oz (72.3 kg)  Height: 5\' 4"  (1.626 m)       Assessment & Plan:   Cheryl Cooper is a 77 y.o. female Insomnia, unspecified type - Plan: hydrOXYzine (ATARAX/VISTARIL) 10 MG tablet  Postconcussion syndrome  Based on age and risks of benzodiazepines, discussed my concerns with prescribing that medication for her at this time.    -Other treatment of insomnia including sleep hygiene was discussed, with information provided on handout.  -  Agreed on  short-term low-dose hydroxyzine for now, but if paradoxical effect of increased irritation or irritability, stop that medication right away.  Potential side effects and risks of hydroxyzine were discussed.  -Consider meeting with psychologist/sleep psychologist if persistent symptoms  Meds ordered  this encounter  Medications  . hydrOXYzine (ATARAX/VISTARIL) 10 MG tablet    Sig: Take 1 tablet (10 mg total) by mouth at bedtime as needed for anxiety (and sleep.).    Dispense:  30 tablet    Refill:  0   Patient Instructions   See information on sleep below.  - avoid any caffeine after lunchtime.  -exercise as recommended by your orthopaedist during the day can help (not within a few hours of sleep).  -turn clock away so you can not see time from bed.  -try relaxing activity before bedtime, and read a book prior to bedtime - try to avoid television or other electronic media within an hour or two before going to sleep.  - low dose hydroxyzine for now if needed, but be careful with side effects of sedation and risk of fall with that medication. Stop if new side effects.   If continued difficulty getting to sleep, it may be worth meeting with counselor or sleep psychologist.   Return to the clinic or go to the nearest emergency room if any of your symptoms worsen or new symptoms occur.   Insomnia Insomnia is a sleep disorder that makes it difficult to fall asleep or to stay asleep. Insomnia can cause tiredness (fatigue), low energy, difficulty concentrating, mood swings, and poor performance at work or school. There are three different ways to classify insomnia:  Difficulty falling asleep.  Difficulty staying asleep.  Waking up too early in the morning.  Any type of insomnia can be long-term (chronic) or short-term (acute). Both are common. Short-term insomnia usually lasts for three months or less. Chronic insomnia occurs at least three times a week for longer than three months. What are  the causes? Insomnia may be caused by another condition, situation, or substance, such as:  Anxiety.  Certain medicines.  Gastroesophageal reflux disease (GERD) or other gastrointestinal conditions.  Asthma or other breathing conditions.  Restless legs syndrome, sleep apnea, or other sleep disorders.  Chronic pain.  Menopause. This may include hot flashes.  Stroke.  Abuse of alcohol, tobacco, or illegal drugs.  Depression.  Caffeine.  Neurological disorders, such as Alzheimer disease.  An overactive thyroid (hyperthyroidism).  The cause of insomnia may not be known. What increases the risk? Risk factors for insomnia include:  Gender. Women are more commonly affected than men.  Age. Insomnia is more common as you get older.  Stress. This may involve your professional or personal life.  Income. Insomnia is more common in people with lower income.  Lack of exercise.  Irregular work schedule or night shifts.  Traveling between different time zones.  What are the signs or symptoms? If you have insomnia, trouble falling asleep or trouble staying asleep is the main symptom. This may lead to other symptoms, such as:  Feeling fatigued.  Feeling nervous about going to sleep.  Not feeling rested in the morning.  Having trouble concentrating.  Feeling irritable, anxious, or depressed.  How is this treated? Treatment for insomnia depends on the cause. If your insomnia is caused by an underlying condition, treatment will focus on addressing the condition. Treatment may also include:  Medicines to help you sleep.  Counseling or therapy.  Lifestyle adjustments.  Follow these instructions at home:  Take medicines only as directed by your health care provider.  Keep regular sleeping and waking hours. Avoid naps.  Keep a sleep diary to help you and your health care provider figure out what could be causing your insomnia.  Include: ? When you sleep. ? When you  wake up during the night. ? How well you sleep. ? How rested you feel the next day. ? Any side effects of medicines you are taking. ? What you eat and drink.  Make your bedroom a comfortable place where it is easy to fall asleep: ? Put up shades or special blackout curtains to block light from outside. ? Use a white noise machine to block noise. ? Keep the temperature cool.  Exercise regularly as directed by your health care provider. Avoid exercising right before bedtime.  Use relaxation techniques to manage stress. Ask your health care provider to suggest some techniques that may work well for you. These may include: ? Breathing exercises. ? Routines to release muscle tension. ? Visualizing peaceful scenes.  Cut back on alcohol, caffeinated beverages, and cigarettes, especially close to bedtime. These can disrupt your sleep.  Do not overeat or eat spicy foods right before bedtime. This can lead to digestive discomfort that can make it hard for you to sleep.  Limit screen use before bedtime. This includes: ? Watching TV. ? Using your smartphone, tablet, and computer.  Stick to a routine. This can help you fall asleep faster. Try to do a quiet activity, brush your teeth, and go to bed at the same time each night.  Get out of bed if you are still awake after 15 minutes of trying to sleep. Keep the lights down, but try reading or doing a quiet activity. When you feel sleepy, go back to bed.  Make sure that you drive carefully. Avoid driving if you feel very sleepy.  Keep all follow-up appointments as directed by your health care provider. This is important. Contact a health care provider if:  You are tired throughout the day or have trouble in your daily routine due to sleepiness.  You continue to have sleep problems or your sleep problems get worse. Get help right away if:  You have serious thoughts about hurting yourself or someone else. This information is not intended to  replace advice given to you by your health care provider. Make sure you discuss any questions you have with your health care provider. Document Released: 02/02/2000 Document Revised: 07/07/2015 Document Reviewed: 11/05/2013 Elsevier Interactive Patient Education  2018 [employer].   IF you received an x-ray today, you will receive an invoice from [facility]. Please contact [facility] at [phone] with questions or concerns regarding your invoice.   IF you received labwork today, you will receive an invoice from [place]. Please contact [facility] at [phone] with questions or concerns regarding your invoice.   Our billing staff will not be able to assist you with questions regarding bills from these companies.  You will be contacted with the lab results as soon as they are available. The fastest way to get your results is to activate your My Chart account. Instructions are located on the last page of this paperwork. If you have not heard from [place] regarding the results in 2 weeks, please contact this office.       I personally performed the services described in this documentation, which was scribed in my presence. The recorded information has been reviewed and considered for accuracy and completeness, addended by me as needed, and agree with information above.  Signed,   Merri Ray, MD [facility] [place].  06/18/17 10:05 PM

## 2017-06-17 NOTE — Patient Instructions (Addendum)
See information on sleep below.  - avoid any caffeine after lunchtime.  -exercise as recommended by your orthopaedist during the day can help (not within a few hours of sleep).  -turn clock away so you can not see time from bed.  -try relaxing activity before bedtime, and read a book prior to bedtime - try to avoid television or other electronic media within an hour or two before going to sleep.  - low dose hydroxyzine for now if needed, but be careful with side effects of sedation and risk of fall with that medication. Stop if new side effects.   If continued difficulty getting to sleep, it may be worth meeting with counselor or sleep psychologist.   Return to the clinic or go to the nearest emergency room if any of your symptoms worsen or new symptoms occur.   Insomnia Insomnia is a sleep disorder that makes it difficult to fall asleep or to stay asleep. Insomnia can cause tiredness (fatigue), low energy, difficulty concentrating, mood swings, and poor performance at work or school. There are three different ways to classify insomnia:  Difficulty falling asleep.  Difficulty staying asleep.  Waking up too early in the morning.  Any type of insomnia can be long-term (chronic) or short-term (acute). Both are common. Short-term insomnia usually lasts for three months or less. Chronic insomnia occurs at least three times a week for longer than three months. What are the causes? Insomnia may be caused by another condition, situation, or substance, such as:  Anxiety.  Certain medicines.  Gastroesophageal reflux disease (GERD) or other gastrointestinal conditions.  Asthma or other breathing conditions.  Restless legs syndrome, sleep apnea, or other sleep disorders.  Chronic pain.  Menopause. This may include hot flashes.  Stroke.  Abuse of alcohol, tobacco, or illegal drugs.  Depression.  Caffeine.  Neurological disorders, such as Alzheimer disease.  An overactive thyroid  (hyperthyroidism).  The cause of insomnia may not be known. What increases the risk? Risk factors for insomnia include:  Gender. Women are more commonly affected than men.  Age. Insomnia is more common as you get older.  Stress. This may involve your professional or personal life.  Income. Insomnia is more common in people with lower income.  Lack of exercise.  Irregular work schedule or night shifts.  Traveling between different time zones.  What are the signs or symptoms? If you have insomnia, trouble falling asleep or trouble staying asleep is the main symptom. This may lead to other symptoms, such as:  Feeling fatigued.  Feeling nervous about going to sleep.  Not feeling rested in the morning.  Having trouble concentrating.  Feeling irritable, anxious, or depressed.  How is this treated? Treatment for insomnia depends on the cause. If your insomnia is caused by an underlying condition, treatment will focus on addressing the condition. Treatment may also include:  Medicines to help you sleep.  Counseling or therapy.  Lifestyle adjustments.  Follow these instructions at home:  Take medicines only as directed by your health care provider.  Keep regular sleeping and waking hours. Avoid naps.  Keep a sleep diary to help you and your health care provider figure out what could be causing your insomnia. Include: ? When you sleep. ? When you wake up during the night. ? How well you sleep. ? How rested you feel the next day. ? Any side effects of medicines you are taking. ? What you eat and drink.  Make your bedroom a comfortable place where it  is easy to fall asleep: ? Put up shades or special blackout curtains to block light from outside. ? Use a white noise machine to block noise. ? Keep the temperature cool.  Exercise regularly as directed by your health care provider. Avoid exercising right before bedtime.  Use relaxation techniques to manage stress. Ask  your health care provider to suggest some techniques that may work well for you. These may include: ? Breathing exercises. ? Routines to release muscle tension. ? Visualizing peaceful scenes.  Cut back on alcohol, caffeinated beverages, and cigarettes, especially close to bedtime. These can disrupt your sleep.  Do not overeat or eat spicy foods right before bedtime. This can lead to digestive discomfort that can make it hard for you to sleep.  Limit screen use before bedtime. This includes: ? Watching TV. ? Using your smartphone, tablet, and computer.  Stick to a routine. This can help you fall asleep faster. Try to do a quiet activity, brush your teeth, and go to bed at the same time each night.  Get out of bed if you are still awake after 15 minutes of trying to sleep. Keep the lights down, but try reading or doing a quiet activity. When you feel sleepy, go back to bed.  Make sure that you drive carefully. Avoid driving if you feel very sleepy.  Keep all follow-up appointments as directed by your health care provider. This is important. Contact a health care provider if:  You are tired throughout the day or have trouble in your daily routine due to sleepiness.  You continue to have sleep problems or your sleep problems get worse. Get help right away if:  You have serious thoughts about hurting yourself or someone else. This information is not intended to replace advice given to you by your health care provider. Make sure you discuss any questions you have with your health care provider. Document Released: 02/02/2000 Document Revised: 07/07/2015 Document Reviewed: 11/05/2013 Elsevier Interactive Patient Education  2018 [employer].   IF you received an x-ray today, you will receive an invoice from [facility]. Please contact [facility] at [phone] with questions or concerns regarding your invoice.   IF you received labwork today, you will receive an  invoice from [facility]. Please contact [facility] at [phone] with questions or concerns regarding your invoice.   Our billing staff will not be able to assist you with questions regarding bills from these companies.  You will be contacted with the lab results as soon as they are available. The fastest way to get your results is to activate your My Chart account. Instructions are located on the last page of this paperwork. If you have not heard from [place] regarding the results in 2 weeks, please contact this office.

## 2017-06-18 ENCOUNTER — Encounter: Payer: Self-pay | Admitting: Family Medicine

## 2017-06-28 ENCOUNTER — Encounter: Payer: Self-pay | Admitting: Family Medicine

## 2017-07-03 ENCOUNTER — Encounter: Payer: Self-pay | Admitting: Family Medicine

## 2017-07-21 ENCOUNTER — Encounter: Payer: Self-pay | Admitting: Family Medicine

## 2017-07-21 ENCOUNTER — Ambulatory Visit: Payer: Medicare Other | Admitting: Family Medicine

## 2017-07-21 VITALS — BP 140/60 | HR 67 | Ht 63.0 in | Wt 154.4 lb

## 2017-07-21 DIAGNOSIS — E785 Hyperlipidemia, unspecified: Secondary | ICD-10-CM

## 2017-07-21 DIAGNOSIS — Z8673 Personal history of transient ischemic attack (TIA), and cerebral infarction without residual deficits: Secondary | ICD-10-CM | POA: Diagnosis not present

## 2017-07-21 DIAGNOSIS — Z8619 Personal history of other infectious and parasitic diseases: Secondary | ICD-10-CM

## 2017-07-21 DIAGNOSIS — F32A Depression, unspecified: Secondary | ICD-10-CM

## 2017-07-21 DIAGNOSIS — G4700 Insomnia, unspecified: Secondary | ICD-10-CM

## 2017-07-21 DIAGNOSIS — F329 Major depressive disorder, single episode, unspecified: Secondary | ICD-10-CM

## 2017-07-21 MED ORDER — EZETIMIBE 10 MG PO TABS: 10.0000 mg | ORAL_TABLET | Freq: Every day | ORAL | 1 refills | Status: DC

## 2017-07-21 MED ORDER — SIMVASTATIN 40 MG PO TABS: 40.0000 mg | ORAL_TABLET | Freq: Every day | ORAL | 1 refills | Status: DC

## 2017-07-21 NOTE — Progress Notes (Signed)
Subjective:  By signing my name below, I, Moises Blood, attest that this documentation has been prepared under the direction and in the presence of Merri Ray, MD. Electronically Signed: Moises Blood, [place]. 07/21/2017 , 12:07 PM .  Patient was seen in Room 10 .   Patient ID: Cheryl Cooper, female    DOB: Jul 03, 1940, 77 y.o.   MRN: [id number] Chief Complaint  Patient presents with  . sleep med    follow up and wants to talk about perscriptions   HPI Cheryl Cooper is a 77 y.o. female  Patient is here for follow up of insomnia. She was seen on April 30th, her previous PCP was Dr. Suzy Bouchard at that time. She had been seen by neurology for post-concussion syndrome, and brain stem stroke. She had requested klonopin to help with sleep as she had taken that previously. He previous PCP was concerned about that medication including with other medications like Lamictal. We discussed sleep hygiene, and short term hydroxyzine was prescribed. Advised to avoid caffeine in the afternoon. She sent an e-mail on May 11th with sounds of paradoxical symptoms of insomnia. I did recommend meeting with her psychiatrist to discuss other medications or treatments with her Lamictal. She is also followed by Dr. Iran Planas at [facility] endocrinology for diabetes, which she's been diet controlled. She also takes synthroid for hypothyroidism.   She mentions her sleep medications cause her to become jittery with tremors. She's tried taking melatonin 12 mg with some relief. She informs trying benadryl and tylenol-PM in the past, but they "make her dance". She isn't able to take a bath. She believes she takes Lamictal for depression; her daughter has a history of bipolar disorder. She denies feeling anxious or depressed. She still has occasional balance issues. She drinks some caffeine/coke/pepsi in the evening when going out to eat.   Hyperlipidemia She was followed by cardiologist in the past years ago, seen for  HTN as she's had history of brain stem strokes. She had been on vytorin 10-80 mg. She has about a week's worth left, and takes it daily. Per her allergy list, she is allergic to atorvastatin and notes feeling unbalanced, but she doesn't recall this.   HTN When followed by cardiologist, her BP was sky high. She is taking amlodipine daily.   Health maintenance Breast cancer screening: she is due for mammogram.  OBGYN: her OBGYN (Dr. Kris Mouton) retired. Intermittent burning, and occasional yeast infections.   Patient Active Problem List   Diagnosis Date Noted  . Osteoarthritis of right hip 04/24/2017  . Primary osteoarthritis of right hip 04/24/2017  . Complete rotator cuff tear 05/09/2016  . Brainstem stroke ([place]) 01/04/2015  . Dizziness and giddiness 01/04/2015  . Post concussion syndrome 01/04/2015  . Rotator cuff tear, right 11/21/2011   Past Medical History:  Diagnosis Date  . Deafness in left ear   . Diabetes mellitus    diet controlled  . GERD (gastroesophageal reflux disease)   . Hypertension   . Hypothyroid   . Parkinson's disease [facility])    "my doctor says i have a form of parkinsons the middle one that affects you balance"  . PONV (postoperative nausea and vomiting) yrs ago, none recent   Past Surgical History:  Procedure Laterality Date  . ABDOMINAL HYSTERECTOMY  1986  . BACK SURGERY  2010   lower  . left elobow surgery  2003  . left rotator cuff  2001  . r foot surgery  1995  .  right hand surgery  2001  . right index finger surgery  2009  . SHOULDER OPEN ROTATOR CUFF REPAIR  11/21/2011   Procedure: ROTATOR CUFF REPAIR SHOULDER OPEN;  Surgeon: Johnn Hai, MD;  Location: [facility];  Service: Orthopedics;  Laterality: Right;  with subacromial decompression  . SHOULDER OPEN ROTATOR CUFF REPAIR Right 05/09/2016   Procedure: Right shoulder mini open revision rotator cuff repair, subacromial decompression;  Surgeon: Susa Day, MD;  Location: [facility];  Service:  Orthopedics;  Laterality: Right;  . TOTAL HIP ARTHROPLASTY Right 04/24/2017   Procedure: RIGHT TOTAL HIP ARTHROPLASTY ANTERIOR APPROACH;  Surgeon: Rod Can, MD;  Location: [facility];  Service: Orthopedics;  Laterality: Right;  Needs RNFA   Allergies  Allergen Reactions  . Atorvastatin Other (See Comments)    Unbalanced  . Chlordiazepoxide-Clidinium     Dizziness (intolerance)  . Codeine Nausea Only  . Other     Tomato sauce, garlic, onion - severe acid reflux    Prior to Admission medications   Medication Sig Start Date End Date Taking? Authorizing Provider  alendronate (FOSAMAX) 70 MG tablet Take 70 mg by mouth once a week. Take with a full glass of water on an empty stomach.    [provider]  amLODipine (NORVASC) 5 MG tablet Take 5 mg by mouth daily.  04/05/15   [provider]  aspirin 81 MG chewable tablet Chew 1 tablet (81 mg total) by mouth 2 (two) times daily. 04/25/17   Swinteck, Aaron Edelman, MD  Calcium-Vitamin D-Vitamin K (VIACTIV) 130-865-78 MG-UNT-MCG CHEW Chew 2 tablets by mouth daily.    [provider]  Cholecalciferol (VITAMIN D3) 2000 units capsule Take 2,000 Units by mouth daily.    [provider]  escitalopram (LEXAPRO) 10 MG tablet Take 10 mg by mouth daily.  09/03/14   [provider]  esomeprazole (NEXIUM) 40 MG capsule Take 40 mg by mouth 2 (two) times daily.  09/24/14   [provider]  ezetimibe-simvastatin (VYTORIN) 10-80 MG per tablet Take 1 tablet by mouth at bedtime.    [provider]  hydrOXYzine (ATARAX/VISTARIL) 10 MG tablet Take 1 tablet (10 mg total) by mouth at bedtime as needed for anxiety (and sleep.). 06/17/17   Wendie Agreste, MD  lamoTRIgine (LAMICTAL) 100 MG tablet Take 100 mg by mouth daily.     [provider]  levothyroxine (SYNTHROID, LEVOTHROID) 75 MCG tablet Take 75 mcg by mouth daily with breakfast.    [provider]  Multiple Vitamin (MULTIVITAMIN) tablet Take 2  tablets by mouth daily. Women's Multivitamin (Gummiest)    [provider]  oxybutynin (DITROPAN-XL) 10 MG 24 hr tablet Take 10 mg by mouth daily.  11/06/14   [provider]  SUPER B COMPLEX/C PO Take 1 tablet by mouth daily.    [provider]   Social History   Socioeconomic History  . Marital status: Married    Spouse name: Herschel  . Number of children: 3  . Years of education: 85  . Highest education level: Not on file  Occupational History    Comment: retired  Scientific laboratory technician  . Financial resource strain: Not on file  . Food insecurity:    Worry: Not on file    Inability: Not on file  . Transportation needs:    Medical: Not on file    Non-medical: Not on file  Tobacco Use  . Smoking status: Never Smoker  . Smokeless tobacco: Never Used  Substance and Sexual  Activity  . Alcohol use: Yes    Comment: glass wine/day  . Drug use: No  . Sexual activity: Not on file  Lifestyle  . Physical activity:    Days per week: Not on file    Minutes per session: Not on file  . Stress: Not on file  Relationships  . Social connections:    Talks on phone: Not on file    Gets together: Not on file    Attends religious service: Not on file    Active member of club or organization: Not on file    Attends meetings of clubs or organizations: Not on file    Relationship status: Not on file  . Intimate partner violence:    Fear of current or ex partner: Not on file    Emotionally abused: Not on file    Physically abused: Not on file    Forced sexual activity: Not on file  Other Topics Concern  . Not on file  Social History Narrative   Married, lives at home with husband   caffeine - 1 Coke daily   Review of Systems  Constitutional: Negative for fatigue and unexpected weight change.  Respiratory: Negative for chest tightness and shortness of breath.   Cardiovascular: Negative for chest pain, palpitations and leg swelling.  Gastrointestinal: Negative for  abdominal pain and blood in stool.  Neurological: Negative for dizziness, syncope, light-headedness and headaches.  Psychiatric/Behavioral: Positive for sleep disturbance.       Objective:   Physical Exam  Constitutional: She is oriented to person, place, and time. She appears well-developed and well-nourished.  HENT:  Head: Normocephalic and atraumatic.  Eyes: Pupils are equal, round, and reactive to light. Conjunctivae and EOM are normal.  Neck: Carotid bruit is not present.  Cardiovascular: Normal rate, regular rhythm, normal heart sounds and intact distal pulses.  Pulmonary/Chest: Effort normal and breath sounds normal.  Abdominal: Soft. She exhibits no pulsatile midline mass. There is no tenderness.  Neurological: She is alert and oriented to person, place, and time.  Skin: Skin is warm and dry.  Psychiatric: She has a normal mood and affect. Her behavior is normal.  Vitals reviewed.   Vitals:   07/21/17 1122  BP: 140/60  Pulse: 67  SpO2: 97%  Weight: 154 lb 6.4 oz (70 kg)  Height: 5\' 3"  (1.6 m)       Assessment & Plan:   MARSHELLE BILGER is a 77 y.o. female Hyperlipidemia, unspecified hyperlipidemia type - Plan: Comprehensive metabolic panel, Lipid panel, ezetimibe (ZETIA) 10 MG tablet, simvastatin (ZOCOR) 40 MG tablet  -Separate Zetia and simvastatin to see if that will be more cost effective.  Check labs.  History of CVA in adulthood  -Monitor blood pressures, continue statin as above for secondary prevention   Insomnia, unspecified type - Plan: Ambulatory referral to Psychiatry Depression, unspecified depression type - Plan: Ambulatory referral to Psychiatry  -Refer to psychiatry discussed her current medication regimen and for consideration of other changes to help with sleep.  Would prefer not to use benzodiazepine given age and previous  balance issues.  Did not tolerate hydroxyzine.    -We did discuss sleep hygiene last visit and asked that she try some of  those techniques again presently.  Could consider medication such as low-dose TCA but again would discuss with psychiatry.  RTC precautions if worse symptoms prior to seeing psychiatry. History of candidiasis - Plan: Ambulatory referral to Gynecology  -Denies current symptoms.  Would like to establish  with GYN.  New referral placed.  RTC precautions if symptomatic for testing/treatment  Meds ordered this encounter  Medications  . ezetimibe (ZETIA) 10 MG tablet    Sig: Take 1 tablet (10 mg total) by mouth daily.    Dispense:  90 tablet    Refill:  1  . simvastatin (ZOCOR) 40 MG tablet    Sig: Take 1 tablet (40 mg total) by mouth at bedtime.    Dispense:  90 tablet    Refill:  1   Patient Instructions    Return for fasting labwork in the next few days to check levels to determine med changes. I do have Lipitor (atorvastatin) as an allergy. Let me know if that is not an allergy. I will separate the cholesterol meds for now, but did decrease the simvastatin to 40mg  per day due to your other medications and possible interaction.   I would like you to meet with a psychiatrist to discuss medications for sleep and your other medications.I will place that referral.  Avoid any caffeine after lunch. See other recommendations for sleep below as we discussed last visit.   Please schedule physical to review other health maintenance.  Call and schedule mammogram at [facility] and make sure they send me a report. here are other locations if you would like to schedule that study elsewhere:  We recommend that you schedule a mammogram for breast cancer screening. Typically, you do not need a referral to do this. Please contact a local imaging center to schedule your mammogram.  [facility] - [phone]  *ask for the Radiology Department [facility] - [phone] or [phone]  [facility] - [phone] [place] [phone] [facility] - [phone]  *ask for the [facility] - [phone]  *ask for the Radiology Department [facility] - [phone]  *ask for the [facility] - [phone]  I will refer you to gynecology as requested.   Return to the clinic or go to the nearest emergency room if any of your symptoms worsen or new symptoms occur.  Insomnia Insomnia is a sleep disorder that makes it difficult to fall asleep or to stay asleep. Insomnia can cause tiredness (fatigue), low energy, difficulty concentrating, mood swings, and poor performance at work or school. There are three different ways to classify insomnia:  Difficulty falling asleep.  Difficulty staying asleep.  Waking up too early in the morning.  Any type of insomnia can be long-term (chronic) or short-term (acute). Both are common. Short-term insomnia usually lasts for three months or less. Chronic insomnia occurs at least three times a week for longer than three months. What are the causes? Insomnia may be caused by another condition, situation, or substance, such as:  Anxiety.  Certain medicines.  Gastroesophageal reflux disease (GERD) or other gastrointestinal conditions.  Asthma or other breathing conditions.  Restless legs syndrome, sleep apnea, or other sleep disorders.  Chronic pain.  Menopause. This may include hot flashes.  Stroke.  Abuse of alcohol, tobacco, or illegal drugs.  Depression.  Caffeine.  Neurological disorders, such as Alzheimer disease.  An overactive thyroid (hyperthyroidism).  The cause of insomnia may not be known. What increases the risk? Risk factors for insomnia include:  Gender. Women are more commonly affected than men.  Age. Insomnia is more common as you get older.  Stress. This may involve your professional or personal life.  Income. Insomnia is more common in people with lower  income.  Lack of exercise.  Irregular work schedule or night shifts.  Traveling between different time zones.  What are the signs or symptoms? If you have insomnia, trouble falling asleep or trouble staying asleep is the main symptom. This may lead to other symptoms, such as:  Feeling fatigued.  Feeling nervous about going to sleep.  Not feeling rested in the morning.  Having trouble concentrating.  Feeling irritable, anxious, or depressed.  How is this treated? Treatment for insomnia depends on the cause. If your insomnia is caused by an underlying condition, treatment will focus on addressing the condition. Treatment may also include:  Medicines to help you sleep.  Counseling or therapy.  Lifestyle adjustments.  Follow these instructions at home:  Take medicines only as directed by your health care provider.  Keep regular sleeping and waking hours. Avoid naps.  Keep a sleep diary to help you and your health care provider figure out what could be causing your insomnia. Include: ? When you sleep. ? When you wake up during the night. ? How well you sleep. ? How rested you feel the next day. ? Any side effects of medicines you are taking. ? What you eat and drink.  Make your bedroom a comfortable place where it is easy to fall asleep: ? Put up shades or special blackout curtains to block light from outside. ? Use a white noise machine to block noise. ? Keep the temperature cool.  Exercise regularly as directed by your health care provider. Avoid exercising right before bedtime.  Use relaxation techniques to manage stress. Ask your health care provider to suggest some techniques that may work well for you. These may include: ? Breathing exercises. ? Routines to release muscle tension. ? Visualizing peaceful scenes.  Cut back on alcohol, caffeinated beverages, and cigarettes, especially close to bedtime. These can disrupt your sleep.  Do not overeat or eat spicy  foods right before bedtime. This can lead to digestive discomfort that can make it hard for you to sleep.  Limit screen use before bedtime. This includes: ? Watching TV. ? Using your smartphone, tablet, and computer.  Stick to a routine. This can help you fall asleep faster. Try to do a quiet activity, brush your teeth, and go to bed at the same time each night.  Get out of bed if you are still awake after 15 minutes of trying to sleep. Keep the lights down, but try reading or doing a quiet activity. When you feel sleepy, go back to bed.  Make sure that you drive carefully. Avoid driving if you feel very sleepy.  Keep all follow-up appointments as directed by your health care provider. This is important. Contact a health care provider if:  You are tired throughout the day or have trouble in your daily routine due to sleepiness.  You continue to have sleep problems or your sleep problems get worse. Get help right away if:  You have serious thoughts about hurting yourself or someone else. This information is not intended to replace advice given to you by your health care provider. Make sure you discuss any questions you have with your health care provider. Document Released: 02/02/2000 Document Revised: 07/07/2015 Document Reviewed: 11/05/2013 Elsevier Interactive Patient Education  2018 [employer].   IF you received an x-ray today, you will receive an invoice from [facility]. Please contact [facility] at [phone] with questions or concerns regarding your invoice.  IF you received labwork today, you will receive an invoice from [facility]. Please contact [facility] at [phone] with questions or concerns regarding your invoice.   Our billing staff will not be able to assist you with questions regarding bills from these companies.  You will be contacted with the lab results as soon as they are available. The fastest way to get your results is to activate your  My Chart account. Instructions are located on the last page of this paperwork. If you have not heard from [place] regarding the results in 2 weeks, please contact this office.       I personally performed the services described in this documentation, which was scribed in my presence. The recorded information has been reviewed and considered for accuracy and completeness, addended by me as needed, and agree with information above.  Signed,   Merri Ray, MD [facility] [place].  07/23/17 9:27 PM

## 2017-07-21 NOTE — Patient Instructions (Addendum)
Return for fasting labwork in the next few days to check levels to determine med changes. I do have Lipitor (atorvastatin) as an allergy. Let me know if that is not an allergy. I will separate the cholesterol meds for now, but did decrease the simvastatin to 40mg  per day due to your other medications and possible interaction.   I would like you to meet with a psychiatrist to discuss medications for sleep and your other medications.I will place that referral.  Avoid any caffeine after lunch. See other recommendations for sleep below as we discussed last visit.   Please schedule physical to review other health maintenance.  Call and schedule mammogram at [facility] and make sure they send me a report. here are other locations if you would like to schedule that study elsewhere:  We recommend that you schedule a mammogram for breast cancer screening. Typically, you do not need a referral to do this. Please contact a local imaging center to schedule your mammogram.  [facility] - [phone]  *ask for the Radiology Department [facility] ([place]) - [phone] or [phone]  [facility] - [phone] [place] [phone] [facility] - [phone]  *ask for the [facility] - [phone]  *ask for the Radiology Department [facility] - [phone]  *ask for the [place] - [phone]  I will refer you to gynecology as requested.   Return to the clinic or go to the nearest emergency room if any of your symptoms worsen or new symptoms occur.  Insomnia Insomnia is a sleep disorder that makes it difficult to fall asleep or to stay asleep. Insomnia can cause tiredness (fatigue), low energy, difficulty concentrating, mood swings, and poor performance at work or school. There are three different ways to classify insomnia:  Difficulty  falling asleep.  Difficulty staying asleep.  Waking up too early in the morning.  Any type of insomnia can be long-term (chronic) or short-term (acute). Both are common. Short-term insomnia usually lasts for three months or less. Chronic insomnia occurs at least three times a week for longer than three months. What are the causes? Insomnia may be caused by another condition, situation, or substance, such as:  Anxiety.  Certain medicines.  Gastroesophageal reflux disease (GERD) or other gastrointestinal conditions.  Asthma or other breathing conditions.  Restless legs syndrome, sleep apnea, or other sleep disorders.  Chronic pain.  Menopause. This may include hot flashes.  Stroke.  Abuse of alcohol, tobacco, or illegal drugs.  Depression.  Caffeine.  Neurological disorders, such as Alzheimer disease.  An overactive thyroid (hyperthyroidism).  The cause of insomnia may not be known. What increases the risk? Risk factors for insomnia include:  Gender. Women are more commonly affected than men.  Age. Insomnia is more common as you get older.  Stress. This may involve your professional or personal life.  Income. Insomnia is more common in people with lower income.  Lack of exercise.  Irregular work schedule or night shifts.  Traveling between different time zones.  What are the signs or symptoms? If you have insomnia, trouble falling asleep or trouble staying asleep is the main symptom. This may lead to other symptoms, such as:  Feeling fatigued.  Feeling nervous about going to sleep.  Not feeling rested in the morning.  Having trouble concentrating.  Feeling irritable, anxious, or depressed.  How is this treated? Treatment for insomnia depends  on the cause. If your insomnia is caused by an underlying condition, treatment will focus on addressing the condition. Treatment may also include:  Medicines to help you sleep.  Counseling or  therapy.  Lifestyle adjustments.  Follow these instructions at home:  Take medicines only as directed by your health care provider.  Keep regular sleeping and waking hours. Avoid naps.  Keep a sleep diary to help you and your health care provider figure out what could be causing your insomnia. Include: ? When you sleep. ? When you wake up during the night. ? How well you sleep. ? How rested you feel the next day. ? Any side effects of medicines you are taking. ? What you eat and drink.  Make your bedroom a comfortable place where it is easy to fall asleep: ? Put up shades or special blackout curtains to block light from outside. ? Use a white noise machine to block noise. ? Keep the temperature cool.  Exercise regularly as directed by your health care provider. Avoid exercising right before bedtime.  Use relaxation techniques to manage stress. Ask your health care provider to suggest some techniques that may work well for you. These may include: ? Breathing exercises. ? Routines to release muscle tension. ? Visualizing peaceful scenes.  Cut back on alcohol, caffeinated beverages, and cigarettes, especially close to bedtime. These can disrupt your sleep.  Do not overeat or eat spicy foods right before bedtime. This can lead to digestive discomfort that can make it hard for you to sleep.  Limit screen use before bedtime. This includes: ? Watching TV. ? Using your smartphone, tablet, and computer.  Stick to a routine. This can help you fall asleep faster. Try to do a quiet activity, brush your teeth, and go to bed at the same time each night.  Get out of bed if you are still awake after 15 minutes of trying to sleep. Keep the lights down, but try reading or doing a quiet activity. When you feel sleepy, go back to bed.  Make sure that you drive carefully. Avoid driving if you feel very sleepy.  Keep all follow-up appointments as directed by your health care provider. This is  important. Contact a health care provider if:  You are tired throughout the day or have trouble in your daily routine due to sleepiness.  You continue to have sleep problems or your sleep problems get worse. Get help right away if:  You have serious thoughts about hurting yourself or someone else. This information is not intended to replace advice given to you by your health care provider. Make sure you discuss any questions you have with your health care provider. Document Released: 02/02/2000 Document Revised: 07/07/2015 Document Reviewed: 11/05/2013 Elsevier Interactive Patient Education  2018 [employer].   IF you received an x-ray today, you will receive an invoice from [facility]. Please contact [facility] at [phone] with questions or concerns regarding your invoice.   IF you received labwork today, you will receive an invoice from [place]. Please contact [facility] at [phone] with questions or concerns regarding your invoice.   Our billing staff will not be able to assist you with questions regarding bills from these companies.  You will be contacted with the lab results as soon as they are available. The fastest way to get your results is to activate your My Chart account. Instructions are located on the last page of this paperwork. If you have not heard from [place] regarding the results  in 2 weeks, please contact this office.

## 2017-07-22 ENCOUNTER — Ambulatory Visit (INDEPENDENT_AMBULATORY_CARE_PROVIDER_SITE_OTHER): Payer: Medicare Other | Admitting: Family Medicine

## 2017-07-22 DIAGNOSIS — E785 Hyperlipidemia, unspecified: Secondary | ICD-10-CM

## 2017-07-22 LAB — COMPREHENSIVE METABOLIC PANEL
ALT: 43 IU/L — ABNORMAL HIGH (ref 0–32)
AST: 44 IU/L — AB (ref 0–40)
Albumin/Globulin Ratio: 1.6 (ref 1.2–2.2)
Albumin: 4.3 g/dL (ref 3.5–4.8)
Alkaline Phosphatase: 56 IU/L (ref 39–117)
BUN/Creatinine Ratio: 15 (ref 12–28)
BUN: 13 mg/dL (ref 8–27)
Bilirubin Total: 0.2 mg/dL (ref 0.0–1.2)
CALCIUM: 9.5 mg/dL (ref 8.7–10.3)
CO2: 25 mmol/L (ref 20–29)
CREATININE: 0.84 mg/dL (ref 0.57–1.00)
Chloride: 103 mmol/L (ref 96–106)
GFR, EST AFRICAN AMERICAN: 79 mL/min/{1.73_m2} (ref >59)
GFR, EST NON AFRICAN AMERICAN: 68 mL/min/{1.73_m2} (ref >59)
Globulin, Total: 2.7 g/dL (ref 1.5–4.5)
Glucose: 129 mg/dL — ABNORMAL HIGH (ref 65–99)
Potassium: 4.6 mmol/L (ref 3.5–5.2)
Sodium: 141 mmol/L (ref 134–144)
TOTAL PROTEIN: 7 g/dL (ref 6.0–8.5)

## 2017-07-22 LAB — LIPID PANEL
CHOL/HDL RATIO: 2.4 ratio (ref 0.0–4.4)
CHOLESTEROL TOTAL: 137 mg/dL (ref 100–199)
HDL: 58 mg/dL (ref >39)
LDL CALC: 62 mg/dL (ref 0–99)
Triglycerides: 87 mg/dL (ref 0–149)
VLDL Cholesterol Cal: 17 mg/dL (ref 5–40)

## 2017-07-22 NOTE — Progress Notes (Signed)
Pt came for a lab only visit. Pt did not see provider

## 2017-07-22 NOTE — Progress Notes (Signed)
Lab only visit - patient not seen.  

## 2017-07-23 ENCOUNTER — Encounter: Payer: Self-pay | Admitting: Family Medicine

## 2017-07-31 ENCOUNTER — Ambulatory Visit: Payer: Medicare Other | Admitting: Gynecology

## 2017-07-31 ENCOUNTER — Encounter: Payer: Self-pay | Admitting: Gynecology

## 2017-07-31 VITALS — BP 124/80 | Ht 63.5 in | Wt 157.0 lb

## 2017-07-31 DIAGNOSIS — N898 Other specified noninflammatory disorders of vagina: Secondary | ICD-10-CM | POA: Diagnosis not present

## 2017-07-31 DIAGNOSIS — N952 Postmenopausal atrophic vaginitis: Secondary | ICD-10-CM

## 2017-07-31 DIAGNOSIS — Z01419 Encounter for gynecological examination (general) (routine) without abnormal findings: Secondary | ICD-10-CM

## 2017-07-31 LAB — WET PREP FOR TRICH, YEAST, CLUE

## 2017-07-31 MED ORDER — NYSTATIN-TRIAMCINOLONE 100000-0.1 UNIT/GM-% EX OINT: 1.0000 "application " | TOPICAL_OINTMENT | Freq: Two times a day (BID) | CUTANEOUS | 0 refills | Status: DC

## 2017-07-31 NOTE — Progress Notes (Signed)
    AMARRIA ANDREASEN 08/18/1940 [id number]        77 y.o.  [id number] new patient presents to establish care as well as complaining of several months of vaginal irritation and itching.  No discharge or odor.  No urinary symptoms such as frequency, dysuria, urgency, low back pain, fever or chills.  Status post hysterectomy a number of years ago due to bleeding.  Still retains her ovaries.  Reports no issues with Pap smears in the past.  Past medical history,surgical history, problem list, medications, allergies, family history and social history were all reviewed and documented as reviewed in the EPIC chart.  ROS:  Performed with pertinent positives and negatives included in the history, assessment and plan.   Additional significant findings : None   Exam: Caryn Bee assistant Vitals:   07/31/17 1401  BP: 124/80  Weight: 157 lb (71.2 kg)  Height: 5' 3.5" (1.613 m)   Body mass index is 27.38 kg/m.  General appearance:  Normal affect, orientation and appearance. Skin: Grossly normal HEENT: Without gross lesions.  No cervical or supraclavicular adenopathy. Thyroid normal.  Lungs:  Clear without wheezing, rales or rhonchi Cardiac: RR, without RMG Abdominal:  Soft, nontender, without masses, guarding, rebound, organomegaly or hernia Breasts:  Examined lying and sitting without masses, retractions, discharge or axillary adenopathy. Pelvic:  Ext, BUS, Vagina: With atrophic changes.  Scant discharge noted.  Adnexa: Without masses or tenderness    Anus and perineum: Normal   Rectovaginal: Normal sphincter tone without palpated masses or tenderness.    Assessment/Plan:  77 y.o. [id number] female for annual gynecologic exam.   1. Vaginal irritation.  Exam shows atrophic changes but no significant discharge.  Wet prep was negative.  Will check urine analysis today.  Discussed possible etiologies.  As it is relatively new in onset unlikely atrophic.  Will cover for low-grade yeast with Mycolog cream  externally twice daily as needed 30 g tube.  We will follow-up if the symptoms persist despite this. 2. Osteopenia.  DEXA 2018 T score -1.6.  FRAX 17% / 3.3%.  Is on alendronate by her other physician.  She will continue to follow-up with them in reference to bone health. 3. Pap smear a number of years ago.  No Pap smear done today.  No history of abnormal Pap smears.  Discussed current screening options and we both agree to stop screening due to age and hysterectomy history. 4. Colonoscopy 2018.  Repeat at their recommended interval. 5. Mammogram 07/2016.  Due now and I reminded her to call and schedule this.  Breast exam normal today. 6. Health maintenance.  Patient asked me if I could prescribe clonazepam that she she has used before for sleep.  Dr. Nyoka Cowden is actively working with her in reference to her sleep issues and I declined to prescribe anything at this time referring her back to him as we discussed one person really needs to oversee this and manage it for her.  No routine lab work done as patient does this elsewhere.  Follow-up in 1 year, sooner as needed.   Anastasio Auerbach MD, 2:18 PM 07/31/2017

## 2017-07-31 NOTE — Patient Instructions (Signed)
Schedule your mammogram as it is due now.  Use the prescribed cream on the outside of the vagina for the itching symptoms.  Follow-up if this does not relieve your itching.  Follow-up in 1 year for exam.

## 2017-08-01 ENCOUNTER — Telehealth: Payer: Self-pay | Admitting: Family Medicine

## 2017-08-01 DIAGNOSIS — F411 Generalized anxiety disorder: Secondary | ICD-10-CM

## 2017-08-01 DIAGNOSIS — G4700 Insomnia, unspecified: Secondary | ICD-10-CM

## 2017-08-01 DIAGNOSIS — F0781 Postconcussional syndrome: Secondary | ICD-10-CM

## 2017-08-01 NOTE — Telephone Encounter (Signed)
Copied from [place] [id number]. Topic: Quick Communication - See Telephone Encounter >> Aug 01, 2017 12:04 PM Percell Belt A wrote: CRM for notification. See Telephone encounter for: 08/01/17.  Pt called in and stated that she was referred to phycologist for meds that she was taking.  She stated it was going to cost her $150.00 to see them and she said that she could not afford to go?  What other options does she have?  She stated that she is up for changing her meds if Dr green would like to  Best number [phone]

## 2017-08-02 LAB — URINALYSIS, COMPLETE W/RFL CULTURE
BACTERIA UA: NONE SEEN /HPF
BILIRUBIN URINE: NEGATIVE
Glucose, UA: NEGATIVE
Hgb urine dipstick: NEGATIVE
Ketones, ur: NEGATIVE
LEUKOCYTE ESTERASE: NEGATIVE
Nitrites, Initial: NEGATIVE
Protein, ur: NEGATIVE
Specific Gravity, Urine: 1.021 (ref 1.001–1.03)
WBC UA: NONE SEEN /HPF (ref 0–5)

## 2017-08-02 LAB — URINE CULTURE
MICRO NUMBER: 90715544
SPECIMEN QUALITY: ADEQUATE

## 2017-08-02 LAB — CULTURE INDICATED

## 2017-08-05 ENCOUNTER — Other Ambulatory Visit: Payer: Self-pay | Admitting: Gynecology

## 2017-08-05 DIAGNOSIS — Z1231 Encounter for screening mammogram for malignant neoplasm of breast: Secondary | ICD-10-CM

## 2017-08-05 NOTE — Telephone Encounter (Signed)
Can we look at other psychiatry options that may be more cost effective? I would like her to be followed by psychiatry and not comfortable changing her meds.

## 2017-08-06 ENCOUNTER — Ambulatory Visit (HOSPITAL_BASED_OUTPATIENT_CLINIC_OR_DEPARTMENT_OTHER)
Admission: RE | Admit: 2017-08-06 | Discharge: 2017-08-06 | Disposition: A | Discharge status: Home / Self Care | Payer: Medicare Other | Source: Ambulatory Visit | Attending: Gynecology | Admitting: Gynecology

## 2017-08-06 DIAGNOSIS — Z1231 Encounter for screening mammogram for malignant neoplasm of breast: Secondary | ICD-10-CM | POA: Insufficient documentation

## 2017-08-06 NOTE — Telephone Encounter (Signed)
Dr. Carlota Raspberry, would it be an option for pt to be seen at the [facility] off  [address] They do have a fund they draw from for pts with limited income.  There is a psychiatrist there.  Thanks.

## 2017-08-06 NOTE — Telephone Encounter (Signed)
I think that is a great option.  If referral needed, let me know.

## 2017-08-06 NOTE — Telephone Encounter (Signed)
Yes, referral to psychiatrist is needed. Please include pt has limited income. Thanks

## 2017-08-07 NOTE — Addendum Note (Signed)
Addended by: Merri Ray R on: 08/07/2017 02:55 PM   Modules accepted: Orders

## 2017-09-10 ENCOUNTER — Telehealth: Payer: Self-pay | Admitting: Family Medicine

## 2017-09-10 NOTE — Telephone Encounter (Signed)
Copied from [place] [id number]. Topic: Quick Communication - See Telephone Encounter >> Sep 10, 2017  2:59 PM Burchel, Abbi R wrote: CRM for notification. See Telephone encounter for: 09/10/17.  Pt states that she had an appt w/psych (Dr. Frederico Hamman) today which was cancelled by the provider.  It has been resched. for 10/15/17, should she keep this appt, and will Dr Carlota Raspberry fill her rxs until the 8/28?  Pt: [phone]

## 2017-09-11 ENCOUNTER — Encounter: Payer: Medicare Other | Admitting: Family Medicine

## 2017-09-18 ENCOUNTER — Ambulatory Visit (INDEPENDENT_AMBULATORY_CARE_PROVIDER_SITE_OTHER): Payer: Medicare Other | Admitting: Family Medicine

## 2017-09-18 ENCOUNTER — Encounter: Payer: Self-pay | Admitting: Family Medicine

## 2017-09-18 ENCOUNTER — Other Ambulatory Visit: Payer: Self-pay

## 2017-09-18 VITALS — BP 130/78 | HR 64 | Temp 97.9°F | Ht 63.0 in | Wt 156.4 lb

## 2017-09-18 DIAGNOSIS — Z23 Encounter for immunization: Secondary | ICD-10-CM | POA: Diagnosis not present

## 2017-09-18 DIAGNOSIS — R519 Headache, unspecified: Secondary | ICD-10-CM

## 2017-09-18 DIAGNOSIS — F0781 Postconcussional syndrome: Secondary | ICD-10-CM | POA: Diagnosis not present

## 2017-09-18 DIAGNOSIS — Z0000 Encounter for general adult medical examination without abnormal findings: Secondary | ICD-10-CM

## 2017-09-18 DIAGNOSIS — G44219 Episodic tension-type headache, not intractable: Secondary | ICD-10-CM

## 2017-09-18 DIAGNOSIS — R51 Headache: Secondary | ICD-10-CM

## 2017-09-18 NOTE — Patient Instructions (Addendum)
Let me know if I can refer you to headache specialist.  Keep follow up with psychiatry as planned.  Keep appointment with ENT tomorrow. If any unsteadiness/dizziness or any further falls, will need to discuss with neurology.  I would recommend meeting with neurology to discuss memory testing and headaches.  Call them for follow up, but let me know if they need a new referral and I can place that order. I will check a blood test for one type of headaches today.   Return to the clinic or go to the nearest emergency room if any of your symptoms worsen or new symptoms occur.   Preventive Care 30 Years and Older, Female Preventive care refers to lifestyle choices and visits with your health care provider that can promote health and wellness. What does preventive care include?  A yearly physical exam. This is also called an annual well check.  Dental exams once or twice a year.  Routine eye exams. Ask your health care provider how often you should have your eyes checked.  Personal lifestyle choices, including: ? Daily care of your teeth and gums. ? Regular physical activity. ? Eating a healthy diet. ? Avoiding tobacco and drug use. ? Limiting alcohol use. ? Practicing safe sex. ? Taking low-dose aspirin every day. ? Taking vitamin and mineral supplements as recommended by your health care provider. What happens during an annual well check? The services and screenings done by your health care provider during your annual well check will depend on your age, overall health, lifestyle risk factors, and family history of disease. Counseling Your health care provider may ask you questions about your:  Alcohol use.  Tobacco use.  Drug use.  Emotional well-being.  Home and relationship well-being.  Sexual activity.  Eating habits.  History of falls.  Memory and ability to understand (cognition).  Work and work Statistician.  Reproductive health.  Screening You may have the  following tests or measurements:  Height, weight, and BMI.  Blood pressure.  Lipid and cholesterol levels. These may be checked every 5 years, or more frequently if you are over 49 years old.  Skin check.  Lung cancer screening. You may have this screening every year starting at age 53 if you have a 30-pack-year history of smoking and currently smoke or have quit within the past 15 years.  Fecal occult blood test (FOBT) of the stool. You may have this test every year starting at age 75.  Flexible sigmoidoscopy or colonoscopy. You may have a sigmoidoscopy every 5 years or a colonoscopy every 10 years starting at age 41.  Hepatitis C blood test.  Hepatitis B blood test.  Sexually transmitted disease (STD) testing.  Diabetes screening. This is done by checking your blood sugar (glucose) after you have not eaten for a while (fasting). You may have this done every 1-3 years.  Bone density scan. This is done to screen for osteoporosis. You may have this done starting at age 42.  Mammogram. This may be done every 1-2 years. Talk to your health care provider about how often you should have regular mammograms.  Talk with your health care provider about your test results, treatment options, and if necessary, the need for more tests. Vaccines Your health care provider may recommend certain vaccines, such as:  Influenza vaccine. This is recommended every year.  Tetanus, diphtheria, and acellular pertussis (Tdap, Td) vaccine. You may need a Td booster every 10 years.  Varicella vaccine. You may need this if you  have not been vaccinated.  Zoster vaccine. You may need this after age 23.  Measles, mumps, and rubella (MMR) vaccine. You may need at least one dose of MMR if you were born in 1957 or later. You may also need a second dose.  Pneumococcal 13-valent conjugate (PCV13) vaccine. One dose is recommended after age 32.  Pneumococcal polysaccharide (PPSV23) vaccine. One dose is  recommended after age 64.  Meningococcal vaccine. You may need this if you have certain conditions.  Hepatitis A vaccine. You may need this if you have certain conditions or if you travel or work in places where you may be exposed to hepatitis A.  Hepatitis B vaccine. You may need this if you have certain conditions or if you travel or work in places where you may be exposed to hepatitis B.  Haemophilus influenzae type b (Hib) vaccine. You may need this if you have certain conditions.  Talk to your health care provider about which screenings and vaccines you need and how often you need them. This information is not intended to replace advice given to you by your health care provider. Make sure you discuss any questions you have with your health care provider. Document Released: 03/03/2015 Document Revised: 10/25/2015 Document Reviewed: 12/06/2014 Elsevier Interactive Patient Education  2018 [employer].      IF you received an x-ray today, you will receive an invoice from [facility]. Please contact [facility] at [phone] with questions or concerns regarding your invoice.   IF you received labwork today, you will receive an invoice from [place]. Please contact [facility] at [phone] with questions or concerns regarding your invoice.   Our billing staff will not be able to assist you with questions regarding bills from these companies.  You will be contacted with the lab results as soon as they are available. The fastest way to get your results is to activate your My Chart account. Instructions are located on the last page of this paperwork. If you have not heard from [place] regarding the results in 2 weeks, please contact this office.

## 2017-09-18 NOTE — Progress Notes (Signed)
Subjective:    Patient ID: Cheryl Cooper, female    DOB: 1940-04-30, 77 y.o.   MRN: [id number]  HPI Here for wellness exam.  Cheryl Cooper is a 77 y.o. female Presents today for: Chief Complaint  Patient presents with  . Annual Exam    CPE   history of multiple medical problems per problem list. Followed by endocrine for diabetes and thyroid, gastroenterology for reflux. Hx of "silent stroke", no recent neuro eval, postconcussion syndrome, and anxiety and insomnia. Planned follow up with psychiatry on 8/28. Has meds, and sleep is better.   Tension headaches once every day or two. No new weakness or slurred speech. Taking tylenol for headache. Has had headaches for years. No recent HA specialist eval, prior treated with biofeedback. Does not want to meet with headache specialist at this time as not new or changing, and improve with tylenol.  Does experience headaches in left temple. No vision changes or darkening of vision.   Cancer screening: Colon: GI is Dr. Liane Comber, [facility]. Colonoscopy 11/05/16 Breast: mammogram 08/07/17 Bone density: on fosamax since December. Has had some GI intolerance but has discussed with GI. Last BMD 11/12/16 - t score -1.6.  Cervical: history of hysterectomy - benign reasons. GYN: Fontaine.   Immunization: No known history of pna vaccine.  Deferring tetanus.  Had shingles vaccine at [facility] on [address].  Flu shot yearly.   Fall screen: 1 fall after hip replacement in March. Had xrays and was ok. No falling or difficulty with balance recently.   Depression screen: Depression screen [facility] 2/9 09/18/2017 07/21/2017 06/17/2017  Decreased Interest 0 0 1  Down, Depressed, Hopeless 0 0 0  PHQ - 2 Score 0 0 1   Vision screen:  Visual Acuity Screening   Right eye Left eye Both eyes  Without correction:  20/200   With correction: 20/30  20/30  not wearing contact in left eye. optho every October.   Functional Status Survey: Is the patient deaf or  have difficulty hearing?: Yes(cant hear in left ear) Does the patient have difficulty seeing, even when wearing glasses/contacts?: No Does the patient have difficulty concentrating, remembering, or making decisions?: No Does the patient have difficulty walking or climbing stairs?: Yes(stairs) Does the patient have difficulty dressing or bathing?: No Does the patient have difficulty doing errands alone such as visiting a doctor's office or shopping?: No Chronic hearing loss on left, followed by ENT Improved walking after hip replacement   Memory screen: 6CIT Screen 09/18/2017  What Year? 0 points  What month? 0 points  What time? 0 points  Count back from 20 2 points  Months in reverse 2 points  Repeat phrase 4 points  Total Score 8  denies difficulty with memory or confusion. Denies ADl or IADL deficits. Brother has a form of dementia.   Dentist: every 6 months.   Exercise: water aerobics twice per week.   Advanced directives: has a living will.   Lipid panel in June.   Patient Active Problem List   Diagnosis Date Noted  . Osteoarthritis of right hip 04/24/2017  . Primary osteoarthritis of right hip 04/24/2017  . Complete rotator cuff tear 05/09/2016  . Brainstem stroke ([place]) 01/04/2015  . Dizziness and giddiness 01/04/2015  . Post concussion syndrome 01/04/2015  . Rotator cuff tear, right 11/21/2011   Past Medical History:  Diagnosis Date  . Deafness in left ear   . Diabetes mellitus    diet controlled  .  GERD (gastroesophageal reflux disease)   . Hypertension   . Hypothyroid   . Parkinson's disease [facility])    "my doctor says i have a form of parkinsons the middle one that affects you balance"  . PONV (postoperative nausea and vomiting) yrs ago, none recent   Past Surgical History:  Procedure Laterality Date  . ABDOMINAL HYSTERECTOMY  1986  . BACK SURGERY  2010   lower  . left elobow surgery  2003  . left rotator cuff  2001  . r foot surgery  1995  . right hand  surgery  2001  . right index finger surgery  2009  . SHOULDER OPEN ROTATOR CUFF REPAIR  11/21/2011   Procedure: ROTATOR CUFF REPAIR SHOULDER OPEN;  Surgeon: Johnn Hai, MD;  Location: [facility];  Service: Orthopedics;  Laterality: Right;  with subacromial decompression  . SHOULDER OPEN ROTATOR CUFF REPAIR Right 05/09/2016   Procedure: Right shoulder mini open revision rotator cuff repair, subacromial decompression;  Surgeon: Susa Day, MD;  Location: [facility];  Service: Orthopedics;  Laterality: Right;  . TOTAL HIP ARTHROPLASTY Right 04/24/2017   Procedure: RIGHT TOTAL HIP ARTHROPLASTY ANTERIOR APPROACH;  Surgeon: Rod Can, MD;  Location: [facility];  Service: Orthopedics;  Laterality: Right;  Needs RNFA   Allergies  Allergen Reactions  . Atorvastatin Other (See Comments)    Unbalanced  . Chlordiazepoxide-Clidinium     Dizziness (intolerance)  . Codeine Nausea Only  . Other     Tomato sauce, garlic, onion - severe acid reflux    Prior to Admission medications   Medication Sig Start Date End Date Taking? Authorizing Provider  alendronate (FOSAMAX) 70 MG tablet Take 70 mg by mouth once a week. Take with a full glass of water on an empty stomach.   Yes [provider]  amLODipine (NORVASC) 5 MG tablet Take 5 mg by mouth daily.  04/05/15  Yes [provider]  aspirin 81 MG chewable tablet Chew 1 tablet (81 mg total) by mouth 2 (two) times daily. 04/25/17  Yes Swinteck, Aaron Edelman, MD  Calcium-Vitamin D-Vitamin K (VIACTIV) 330-076-22 MG-UNT-MCG CHEW Chew 2 tablets by mouth daily.   Yes [provider]  Cholecalciferol (VITAMIN D3) 2000 units capsule Take 2,000 Units by mouth daily.   Yes [provider]  escitalopram (LEXAPRO) 10 MG tablet Take 10 mg by mouth daily.  09/03/14  Yes [provider]  esomeprazole (NEXIUM) 40 MG capsule Take 40 mg by mouth 2 (two) times daily.  09/24/14  Yes [provider]  ezetimibe (ZETIA) 10 MG tablet Take 1 tablet  (10 mg total) by mouth daily. 07/21/17  Yes Wendie Agreste, MD  ezetimibe-simvastatin (VYTORIN) 10-80 MG per tablet Take 1 tablet by mouth at bedtime.   Yes [provider]  hydrOXYzine (ATARAX/VISTARIL) 10 MG tablet Take 1 tablet (10 mg total) by mouth at bedtime as needed for anxiety (and sleep.). 06/17/17  Yes Wendie Agreste, MD  lamoTRIgine (LAMICTAL) 100 MG tablet Take 100 mg by mouth daily.    Yes [provider]  levothyroxine (SYNTHROID, LEVOTHROID) 75 MCG tablet Take 75 mcg by mouth daily with breakfast.   Yes [provider]  Multiple Vitamin (MULTIVITAMIN) tablet Take 2 tablets by mouth daily. Women's Multivitamin (Gummiest)   Yes [provider]  nystatin-triamcinolone ointment (MYCOLOG) Apply 1 application topically 2 (two) times daily. 07/31/17  Yes Fontaine, Belinda Block, MD  oxybutynin (DITROPAN-XL) 10 MG 24 hr tablet Take 10 mg by mouth daily.  11/06/14  Yes [provider]  simvastatin (ZOCOR) 40 MG tablet Take 1 tablet (40 mg total) by mouth at bedtime. 07/21/17  Yes Wendie Agreste, MD  SUPER B COMPLEX/C PO Take 1 tablet by mouth daily.   Yes [provider]   Social History   Socioeconomic History  . Marital status: Married    Spouse name: Herschel  . Number of children: 3  . Years of education: 81  . Highest education level: Not on file  Occupational History    Comment: retired  Scientific laboratory technician  . Financial resource strain: Not on file  . Food insecurity:    Worry: Not on file    Inability: Not on file  . Transportation needs:    Medical: Not on file    Non-medical: Not on file  Tobacco Use  . Smoking status: Never Smoker  . Smokeless tobacco: Never Used  Substance and Sexual Activity  . Alcohol use: Yes    Comment: glass wine/day  . Drug use: No  . Sexual activity: Not Currently    Comment: 1st intercourse 77 yo-Fewer than 5 partners  Lifestyle  . Physical activity:    Days per week: Not on file     Minutes per session: Not on file  . Stress: Not on file  Relationships  . Social connections:    Talks on phone: Not on file    Gets together: Not on file    Attends religious service: Not on file    Active member of club or organization: Not on file    Attends meetings of clubs or organizations: Not on file    Relationship status: Not on file  . Intimate partner violence:    Fear of current or ex partner: Not on file    Emotionally abused: Not on file    Physically abused: Not on file    Forced sexual activity: Not on file  Other Topics Concern  . Not on file  Social History Narrative   Married, lives at home with husband   caffeine - 1 Coke daily    Review of Systems  HENT: Positive for hearing loss (had for years. goint to ENT tomorrow. ) and sneezing.   Allergic/Immunologic: Positive for environmental allergies.  Neurological: Positive for headaches.  Psychiatric/Behavioral: Positive for dysphoric mood (sad mood, no SI. see HPI. ). The patient is nervous/anxious.   All other systems reviewed and are negative.      Objective:   Physical Exam  Constitutional: She is oriented to person, place, and time. She appears well-developed and well-nourished.  HENT:  Head: Normocephalic and atraumatic.  Right Ear: External ear normal.  Left Ear: External ear normal.  Mouth/Throat: Oropharynx is clear and moist.  Eyes: Pupils are equal, round, and reactive to light. Conjunctivae are normal.  Neck: Normal range of motion. Neck supple. No thyromegaly present.  Cardiovascular: Normal rate, regular rhythm, normal heart sounds and intact distal pulses.  No murmur heard. Pulmonary/Chest: Effort normal and breath sounds normal. No respiratory distress. She has no wheezes.  Abdominal: Soft. Bowel sounds are normal. There is no tenderness.  Musculoskeletal: Normal range of motion. She exhibits no edema or tenderness.  Lymphadenopathy:    She has no cervical adenopathy.  Neurological: She  is alert and oriented to person, place, and time.  Skin: Skin is warm and dry. No rash noted.  Psychiatric: She has a normal mood and affect. Her behavior is normal. Thought content normal.  Assessment & Plan:   NELTA CAUDILL is a 77 y.o. female Encounter for Medicare annual wellness exam  -  - anticipatory guidance as below in AVS, screening labs if needed. Health maintenance items as above in HPI discussed/recommended as applicable.   - no concerning responses on depression, fall, or functional status screening. Any positive responses noted as above. Advanced directives discussed as in CH[facility].   Episodic tension-type headache, not intractable - Plan: Sedimentation Rate  -Sed rate reassuring.  Suspected tension headache/chronic headache, episodic.  Did recommend follow-up with neurology given history of postconcussive syndrome and some difficulty with memory testing.  Declined at this time.  Advised to let me know if she changes her mind, RTC/ER precautions if acute worsening.   Need for pneumococcal vaccination - Plan: Pneumococcal conjugate vaccine 13-valent IM given  Left temporal headache - Plan: Sedimentation Rate  - as above.   Postconcussion syndrome  - as above.   No orders of the defined types were placed in this encounter.  Patient Instructions   Let me know if I can refer you to headache specialist.  Keep follow up with psychiatry as planned.  Keep appointment with ENT tomorrow. If any unsteadiness/dizziness or any further falls, will need to discuss with neurology.  I would recommend meeting with neurology to discuss memory testing and headaches.  Call them for follow up, but let me know if they need a new referral and I can place that order. I will check a blood test for one type of headaches today.   Return to the clinic or go to the nearest emergency room if any of your symptoms worsen or new symptoms occur.   Preventive Care 34 Years and Older,  Female Preventive care refers to lifestyle choices and visits with your health care provider that can promote health and wellness. What does preventive care include?  A yearly physical exam. This is also called an annual well check.  Dental exams once or twice a year.  Routine eye exams. Ask your health care provider how often you should have your eyes checked.  Personal lifestyle choices, including: ? Daily care of your teeth and gums. ? Regular physical activity. ? Eating a healthy diet. ? Avoiding tobacco and drug use. ? Limiting alcohol use. ? Practicing safe sex. ? Taking low-dose aspirin every day. ? Taking vitamin and mineral supplements as recommended by your health care provider. What happens during an annual well check? The services and screenings done by your health care provider during your annual well check will depend on your age, overall health, lifestyle risk factors, and family history of disease. Counseling Your health care provider may ask you questions about your:  Alcohol use.  Tobacco use.  Drug use.  Emotional well-being.  Home and relationship well-being.  Sexual activity.  Eating habits.  History of falls.  Memory and ability to understand (cognition).  Work and work Statistician.  Reproductive health.  Screening You may have the following tests or measurements:  Height, weight, and BMI.  Blood pressure.  Lipid and cholesterol levels. These may be checked every 5 years, or more frequently if you are over 15 years old.  Skin check.  Lung cancer screening. You may have this screening every year starting at age 25 if you have a 30-pack-year history of smoking and currently smoke or have quit within the past 15 years.  Fecal occult blood test (FOBT) of the stool. You may have this test every year starting at age  50.  Flexible sigmoidoscopy or colonoscopy. You may have a sigmoidoscopy every 5 years or a colonoscopy every 10 years  starting at age 54.  Hepatitis C blood test.  Hepatitis B blood test.  Sexually transmitted disease (STD) testing.  Diabetes screening. This is done by checking your blood sugar (glucose) after you have not eaten for a while (fasting). You may have this done every 1-3 years.  Bone density scan. This is done to screen for osteoporosis. You may have this done starting at age 74.  Mammogram. This may be done every 1-2 years. Talk to your health care provider about how often you should have regular mammograms.  Talk with your health care provider about your test results, treatment options, and if necessary, the need for more tests. Vaccines Your health care provider may recommend certain vaccines, such as:  Influenza vaccine. This is recommended every year.  Tetanus, diphtheria, and acellular pertussis (Tdap, Td) vaccine. You may need a Td booster every 10 years.  Varicella vaccine. You may need this if you have not been vaccinated.  Zoster vaccine. You may need this after age 58.  Measles, mumps, and rubella (MMR) vaccine. You may need at least one dose of MMR if you were born in 1957 or later. You may also need a second dose.  Pneumococcal 13-valent conjugate (PCV13) vaccine. One dose is recommended after age 61.  Pneumococcal polysaccharide (PPSV23) vaccine. One dose is recommended after age 49.  Meningococcal vaccine. You may need this if you have certain conditions.  Hepatitis A vaccine. You may need this if you have certain conditions or if you travel or work in places where you may be exposed to hepatitis A.  Hepatitis B vaccine. You may need this if you have certain conditions or if you travel or work in places where you may be exposed to hepatitis B.  Haemophilus influenzae type b (Hib) vaccine. You may need this if you have certain conditions.  Talk to your health care provider about which screenings and vaccines you need and how often you need them. This information is  not intended to replace advice given to you by your health care provider. Make sure you discuss any questions you have with your health care provider. Document Released: 03/03/2015 Document Revised: 10/25/2015 Document Reviewed: 12/06/2014 Elsevier Interactive Patient Education  2018 [employer].      IF you received an x-ray today, you will receive an invoice from [facility]. Please contact [facility] at [phone] with questions or concerns regarding your invoice.   IF you received labwork today, you will receive an invoice from [facility]. Please contact [facility] at [phone] with questions or concerns regarding your invoice.   Our billing staff will not be able to assist you with questions regarding bills from these companies.  You will be contacted with the lab results as soon as they are available. The fastest way to get your results is to activate your My Chart account. Instructions are located on the last page of this paperwork. If you have not heard from [place] regarding the results in 2 weeks, please contact this office.       Signed,   Merri Ray, MD [facility].  09/21/17 12:12 PM

## 2017-09-19 LAB — SEDIMENTATION RATE: SED RATE: 11 mm/h (ref 0–40)

## 2017-09-21 ENCOUNTER — Encounter: Payer: Self-pay | Admitting: Family Medicine

## 2017-09-22 ENCOUNTER — Telehealth: Payer: Self-pay | Admitting: Diagnostic Neuroimaging

## 2017-09-22 NOTE — Telephone Encounter (Signed)
Spoke with patient to schedule her to be seen again for new problems of headaches and memory loss. She was last seen Dec 2017. Her husband is having surgery this Thurs; she prefers to come before he has surgery. Scheduled her for Wed, and advised she arrive 30 minutes early, bring new insurance card and medications. She verbalized understanding, appreciation.

## 2017-09-22 NOTE — Telephone Encounter (Signed)
Pt saw Dr Cindee Lame and he suggested she follow up with Dr Mamie Nick. She is having HA above the eyes and some memory issues. Dr Mamie Nick is booked into December. Please call to advise of an appt.

## 2017-09-24 ENCOUNTER — Encounter: Payer: Self-pay | Admitting: Diagnostic Neuroimaging

## 2017-09-24 ENCOUNTER — Ambulatory Visit: Payer: Medicare Other | Admitting: Diagnostic Neuroimaging

## 2017-09-24 VITALS — BP 143/77 | HR 66 | Ht 63.0 in | Wt 156.2 lb

## 2017-09-24 DIAGNOSIS — I639 Cerebral infarction, unspecified: Secondary | ICD-10-CM

## 2017-09-24 DIAGNOSIS — R269 Unspecified abnormalities of gait and mobility: Secondary | ICD-10-CM | POA: Diagnosis not present

## 2017-09-24 DIAGNOSIS — R42 Dizziness and giddiness: Secondary | ICD-10-CM

## 2017-09-24 DIAGNOSIS — R413 Other amnesia: Secondary | ICD-10-CM

## 2017-09-24 DIAGNOSIS — G44209 Tension-type headache, unspecified, not intractable: Secondary | ICD-10-CM

## 2017-09-24 NOTE — Patient Instructions (Signed)
  MEMORY LOSS - caution with driving; ask spouse to monitor symptoms  HEADACHES (tension vs stress headaches) - monitor for now; continue tylenol

## 2017-09-24 NOTE — Progress Notes (Signed)
[facility]  PATIENT: Cheryl Cooper DOB: 1941/01/09  REFERRING CLINICIAN:  HISTORY FROM: patient REASON FOR VISIT: follow up   HISTORICAL  CHIEF COMPLAINT:  Chief Complaint  Patient presents with  . Headache    rm 6, new problems, MMSE 22 "I get stress headaches in my left temple, get them daily"  . Memory Loss    HISTORY OF PRESENT ILLNESS:   UPDATE (09/24/17, VRP): Since last visit, had right hip surgery for right hip fracture, s/p surgery, now balance is better. Some HA and memory loss issues. Symptoms are mild. No alleviating or aggravating factors. Tolerating meds.    HA are left sided, triggered by stress. More stress lately. Throbbing. No nausea / vomiting. No photophobia or phonophobia. Takes tylenol.   Patient does not feel memory loss. No decline in ADLs.    UPDATE 02/15/16: Since last visit, balance is getting worse. Esp with bending, lifting, climbing. Has fallen down several times. Leans to the right side.   UPDATE 07/04/15: Since last visit, doing better with balance, except fell off treadmill x 1 --> 3 weeks ago. Still with intermittent insomnia, worry/anxiety at night.  UPDATE 02/14/15: Since last visit, balance still off. Still with blood pressure fluctuations, esp high BP in evening.   UPDATE 01/04/15: Since last visit, symptoms are improved. Still with general balance diff, esp in early AM and uneven surfaces. Not on daily aspirin. Has done PT eval.   PRIOR HPI (12/02/14): 77 year old right-handed female here for evaluation of postconcussion syndrome. 11/17/2014 patient was visiting family in [place], in the garage putting a broom away. All of a sudden she felt very dizzy and felt like she was losing her balance. She does not remember exactly what happened next. Apparently she staggered backwards, fell down onto the concrete floor and had a severe scalp laceration. Patient tried to stand up from laying position but was unable to sit up. She  felt very swimmy headed. Ambulance was called by her family and she is taken to local emergency room. Her blood pressure was noted to be 189/90. She had CT scan of the head which was apparently unremarkable. EKG and blood testing were unremarkable. She had "staples" to her scalp and was discharged from the emergency room. Since then patient has continued to have dizziness, pressure on the bitemporal region, neck pain, slowness of words, physical movements and balance. No vision changes. Over the past 6 months patient has had increasing balance and gait difficulties. She fell twice in the last 6 months. She's been having more balance difficulties in general. More close calls and stumbling/stripping on objects.   REVIEW OF SYSTEMS: Full 14 system review of systems performed and negative: except     ALLERGIES: Allergies  Allergen Reactions  . Atorvastatin Other (See Comments)    Unbalanced  . Chlordiazepoxide-Clidinium     Dizziness (intolerance)  . Codeine Nausea Only  . Other     Tomato sauce, garlic, onion - severe acid reflux     HOME MEDICATIONS: Outpatient Medications Prior to Visit  Medication Sig Dispense Refill  . alendronate (FOSAMAX) 70 MG tablet Take 70 mg by mouth once a week. Take with a full glass of water on an empty stomach.    [name] amLODipine (NORVASC) 5 MG tablet Take 5 mg by mouth daily.     [name] aspirin 81 MG chewable tablet Chew 1 tablet (81 mg total) by mouth 2 (two) times daily. 60 tablet 1  . Calcium-Vitamin D-Vitamin K (VIACTIV)  [phone] MG-UNT-MCG CHEW Chew 2 tablets by mouth daily.    . Cholecalciferol (VITAMIN D3) 2000 units capsule Take 2,000 Units by mouth daily.    [name] escitalopram (LEXAPRO) 10 MG tablet Take 10 mg by mouth daily.   3  . esomeprazole (NEXIUM) 40 MG capsule Take 40 mg by mouth 2 (two) times daily.   7  . ezetimibe (ZETIA) 10 MG tablet Take 1 tablet (10 mg total) by mouth daily. 90 tablet 1  . ezetimibe-simvastatin (VYTORIN) 10-80 MG per tablet Take  1 tablet by mouth at bedtime.    . hydrOXYzine (ATARAX/VISTARIL) 10 MG tablet Take 1 tablet (10 mg total) by mouth at bedtime as needed for anxiety (and sleep.). 30 tablet 0  . lamoTRIgine (LAMICTAL) 100 MG tablet Take 100 mg by mouth daily.     [name] levothyroxine (SYNTHROID, LEVOTHROID) 75 MCG tablet Take 75 mcg by mouth daily with breakfast.    . Multiple Vitamin (MULTIVITAMIN) tablet Take 2 tablets by mouth daily. Women's Multivitamin (Gummiest)    . nystatin-triamcinolone ointment (MYCOLOG) Apply 1 application topically 2 (two) times daily. 30 g 0  . oxybutynin (DITROPAN-XL) 10 MG 24 hr tablet Take 10 mg by mouth daily.   9  . simvastatin (ZOCOR) 40 MG tablet Take 1 tablet (40 mg total) by mouth at bedtime. 90 tablet 1  . SUPER B COMPLEX/C PO Take 1 tablet by mouth daily.     No facility-administered medications prior to visit.     PAST MEDICAL HISTORY: Past Medical History:  Diagnosis Date  . Deafness in left ear   . Diabetes mellitus    diet controlled  . GERD (gastroesophageal reflux disease)   . Hypertension   . Hypothyroid   . Parkinson's disease [facility])    "my doctor says i have a form of parkinsons the middle one that affects you balance"  . PONV (postoperative nausea and vomiting) yrs ago, none recent    PAST SURGICAL HISTORY: Past Surgical History:  Procedure Laterality Date  . ABDOMINAL HYSTERECTOMY  1986  . BACK SURGERY  2010   lower  . left elobow surgery  2003  . left rotator cuff  2001  . r foot surgery  1995  . right hand surgery  2001  . right index finger surgery  2009  . SHOULDER OPEN ROTATOR CUFF REPAIR  11/21/2011   Procedure: ROTATOR CUFF REPAIR SHOULDER OPEN;  Surgeon: Johnn Hai, MD;  Location: [facility];  Service: Orthopedics;  Laterality: Right;  with subacromial decompression  . SHOULDER OPEN ROTATOR CUFF REPAIR Right 05/09/2016   Procedure: Right shoulder mini open revision rotator cuff repair, subacromial decompression;  Surgeon: Susa Day, MD;   Location: [facility];  Service: Orthopedics;  Laterality: Right;  . TOTAL HIP ARTHROPLASTY Right 04/24/2017   Procedure: RIGHT TOTAL HIP ARTHROPLASTY ANTERIOR APPROACH;  Surgeon: Rod Can, MD;  Location: [facility];  Service: Orthopedics;  Laterality: Right;  Needs RNFA    FAMILY HISTORY: Family History  Problem Relation Age of Onset  . Stroke Father   . Stroke Sister   . Stroke Brother   . Diabetes Brother   . Dementia Brother   . Stroke Sister        TIA    SOCIAL HISTORY:  Social History   Socioeconomic History  . Marital status: Married    Spouse name: Herschel  . Number of children: 3  . Years of education: 5  . Highest education level: Not on file  Occupational History  Comment: retired  Scientific laboratory technician  . Financial resource strain: Not on file  . Food insecurity:    Worry: Not on file    Inability: Not on file  . Transportation needs:    Medical: Not on file    Non-medical: Not on file  Tobacco Use  . Smoking status: Never Smoker  . Smokeless tobacco: Never Used  Substance and Sexual Activity  . Alcohol use: Yes    Comment: glass wine/day  . Drug use: No  . Sexual activity: Not Currently    Comment: 1st intercourse 77 yo-Fewer than 5 partners  Lifestyle  . Physical activity:    Days per week: Not on file    Minutes per session: Not on file  . Stress: Not on file  Relationships  . Social connections:    Talks on phone: Not on file    Gets together: Not on file    Attends religious service: Not on file    Active member of club or organization: Not on file    Attends meetings of clubs or organizations: Not on file    Relationship status: Not on file  . Intimate partner violence:    Fear of current or ex partner: Not on file    Emotionally abused: Not on file    Physically abused: Not on file    Forced sexual activity: Not on file  Other Topics Concern  . Not on file  Social History Narrative   Married, lives at home with husband   caffeine - 1 Coke  daily     PHYSICAL EXAM  GENERAL EXAM/CONSTITUTIONAL: Vitals:  Vitals:   09/24/17 1022  BP: (!) 143/77  Pulse: 66  Weight: 156 lb 3.2 oz (70.9 kg)  Height: 5\' 3"  (1.6 m)     Body mass index is 27.67 kg/m. Wt Readings from Last 3 Encounters:  09/24/17 156 lb 3.2 oz (70.9 kg)  09/18/17 156 lb 6.4 oz (70.9 kg)  07/31/17 157 lb (71.2 kg)     Patient is in no distress; well developed, nourished and groomed; neck is supple  CARDIOVASCULAR:  Examination of carotid arteries is normal; no carotid bruits  Regular rate and rhythm, no murmurs  Examination of peripheral vascular system by observation and palpation is normal  EYES:  Ophthalmoscopic exam of optic discs and posterior segments is normal; no papilledema or hemorrhages  No exam data present  MUSCULOSKELETAL:  Gait, strength, tone, movements noted in Neurologic exam below  NEUROLOGIC: MENTAL STATUS:  MMSE - [place] Exam 09/24/2017  Orientation to time 4  Orientation to Place 4  Registration 3  Attention/ Calculation 2  Recall 1  Language- name 2 objects 2  Language- repeat 1  Language- follow 3 step command 3  Language- read & follow direction 1  Write a sentence 1  Copy design 0  Total score 22    awake, alert, oriented to person, place and time  recent and remote memory intact  normal attention and concentration  language fluent, comprehension intact, naming intact  fund of knowledge appropriate  CRANIAL NERVE:   2nd - no papilledema on fundoscopic exam  2nd, 3rd, 4th, 6th - pupils equal and reactive to light, visual fields full to confrontation, extraocular muscles intact, no nystagmus  5th - facial sensation symmetric  7th - facial strength symmetric  8th - hearing intact  9th - palate elevates symmetrically, uvula midline  11th - shoulder shrug symmetric  12th - tongue protrusion midline  MASKED  FACIES  MOTOR:   MILD COGWHEELING IN RIGHT UPPER EXT  NO  BRADYKINESIA; NO TREMOR  full strength in the BUE, BLE  SENSORY:   normal and symmetric to light touch, temperature, vibration  COORDINATION:   finger-nose-finger, fine finger movements normal  REFLEXES:   deep tendon reflexes TRACE and symmetric  GAIT/STATION:   narrow based gait; DIFF WITH TANDEM      DIAGNOSTIC DATA (LABS, IMAGING, TESTING) - I reviewed patient records, labs, notes, testing and imaging myself where available.  Lab Results  Component Value Date   WBC 15.6 (H) 04/25/2017   HGB 9.9 (L) 04/25/2017   HCT 30.7 (L) 04/25/2017   MCV 87.7 04/25/2017   PLT 152 04/25/2017      Component Value Date/Time   NA 141 07/22/2017 1145   K 4.6 07/22/2017 1145   CL 103 07/22/2017 1145   CO2 25 07/22/2017 1145   GLUCOSE 129 (H) 07/22/2017 1145   GLUCOSE 206 (H) 04/25/2017 0559   BUN 13 07/22/2017 1145   CREATININE 0.84 07/22/2017 1145   CALCIUM 9.5 07/22/2017 1145   PROT 7.0 07/22/2017 1145   ALBUMIN 4.3 07/22/2017 1145   AST 44 (H) 07/22/2017 1145   ALT 43 (H) 07/22/2017 1145   ALKPHOS 56 07/22/2017 1145   BILITOT 0.2 07/22/2017 1145   GFRNONAA 68 07/22/2017 1145   GFRAA 79 07/22/2017 1145   Lab Results  Component Value Date   CHOL 137 07/22/2017   HDL 58 07/22/2017   LDLCALC 62 07/22/2017   TRIG 87 07/22/2017   CHOLHDL 2.4 07/22/2017   Lab Results  Component Value Date   HGBA1C 7.1 (H) 04/17/2017   Lab Results  Component Value Date   PZWCHENI77 824 12/02/2014   Lab Results  Component Value Date   TSH 3.370 12/02/2014     10/09/08 MRI LUMBAR [I reviewed images myself and agree with interpretation. -VRP]  1. Advanced degenerative disc disease at L5-S1 with a broad-based bulging annulus and osteophytic ridging contributing to mild foraminal stenosis bilaterally, left greater than right.  2. Annular rent and central disc protrusion at L4-5. This in combination with hypertrophic facet disease creates mild to moderate right lateral recess  stenosis and could impinge on the right L5 nerve root. 3. Broad-based bulging annulus at L3-4 but no significant spinal or foraminal stenosis.  12/08/14 MRI brain [I reviewed images myself and agree with interpretation. Chronic ischemic changes. Nothing acute. -VRP]  1. Foci in the left mid pons and left lower midbrain consistent with chronic lacunar infarctions. 2. Moderate number of T2/FLAIR hyperintense foci in the hemispheres most consistent with chronic microvascular ischemic change. The extent is more than expected for age. 3. Mild cortical atrophy 4. There are no acute findings.  12/08/14 MRI cervical spine: [I reviewed images myself and agree with interpretation. No cord issues noted. -VRP]  1. At C3-C4, there is moderate right foraminal narrowing due to mild disc bulging and facet hypertrophy. Although there is no definite nerve root compression there is some encroachment upon the exiting right C4 nerve root. 2. At C6-C7 there is moderate loss of disc height, broad disc bulging, mild uncovertebral spurring. This leads to moderate left foraminal narrowing. Although there is no definite nerve root compression there is some encroachment upon the exiting left C7 nerve root. 3. Degenerative changes at C4-C5 and C5-C6 are less likely to lead to nerve root impingement. 4. The spinal cord appears normal.  5. A possible chronic lacunar infarction in the pons is  noted on sagittal images. 6. There are no acute findings.  12/08/14 carotid u/s  - no ICA stenosis  12/16/14 TTE  - Vigorous LV systolic function; grade 1 diastolic dysfunction; aortic sclerosis.      ASSESSMENT AND PLAN  77 y.o. year old female here with progressive gait and balance difficulty, with episode of lightheadedness, dizziness, syncope, resulting in head trauma and postconcussion syndrome, now improved.  Gait and balance difficulty problem and neurologic exam findings of gait difficulty, masked  facies, decreased arm swing, raise possibility of parkinsonism (? parkinson's plus vs dementia with lewy bodies). However, symptoms are mild so will hold off on carb/levo at this time. Also balance issues could be related to prior brainstem stroke sequelae.  The event on 11/13/2014 of dizziness and passing out, leading to head trauma and concussion, could have been either TIA, syncope or balance difficulty due to prior brainstem strokes.    Dx:  Gait difficulty  Brainstem stroke (HCC)  Dizziness and giddiness  Memory loss  Tension headache    PLAN:  GAIT DIFFICULTY  (established, improved; parkinsonism vs brainstem stroke vs deconditioning vs arthritis) - monitor; follow up PT exercises  STROKE PREVENTION (stable) - continue BP, lipid control - continue aspirin 81mg  daily  MEMORY LOSS / NAVIGATION DIFFICULTY (new problem, worsening) - ? Depression, anxiety vs MCI vs mild dementia (DLB?) - caution with driving, finances, ADLs; ask spouse to monitor symptoms  HEADACHES (tension HA vs stress reaction; new problem) - monitor for now; OTC tylenol  Return if symptoms worsen or fail to improve, for return to PCP.    Penni Bombard, MD 10/22/4965, 59:16 AM Certified in Neurology, Neurophysiology and Neuroimaging  [facility] [address] [place], [place] [address] [phone]

## 2017-10-08 ENCOUNTER — Encounter: Payer: Self-pay | Admitting: Family Medicine

## 2017-10-27 ENCOUNTER — Telehealth: Payer: Self-pay | Admitting: Family Medicine

## 2017-10-27 NOTE — Telephone Encounter (Signed)
Copied from [place] [phone]. Topic: General - Other >> Oct 27, 2017 11:02 AM Judyann Munson wrote: Reason for CRM:  kelly - from [place] is calling in regards to a drug interaction.  She is wanting to confirm the MG for simvastatin (ZOCOR) 40 MG tablet. She stated with it being more then 10mg  it doesn't interact with amLODipine (NORVASC) 5 MG tablet.   Please Advise

## 2017-10-28 NOTE — Telephone Encounter (Signed)
Shanon Brow wit [facility] calling to check status of this. Please advise

## 2017-10-29 NOTE — Telephone Encounter (Addendum)
Should be max of 20mg  simvastatin per day while also taking amlodipine. Adjust dose to 20mg  (1/2 of 40mg  per day) to lessen chance of interaction/side effects. I called pharmacy but busy signal x2.  Please contact with this info. Thanks.

## 2017-10-29 NOTE — Telephone Encounter (Signed)
Please advise drug interaction

## 2017-10-31 NOTE — Telephone Encounter (Signed)
Left message on pharmacy voicemail.  Was unable to get through to speak to someone

## 2017-11-13 ENCOUNTER — Other Ambulatory Visit: Payer: Self-pay | Admitting: Family Medicine

## 2017-11-13 NOTE — Telephone Encounter (Signed)
Copied from [place] [phone]. Topic: Quick Communication - See Telephone Encounter >> Nov 13, 2017 10:08 AM Ahmed Prima L wrote: CRM for notification. See Telephone encounter for: 11/13/17.  Patient said she would like Dr Carlota Raspberry to take over this medication for her " oxybutynin (DITROPAN-XL) 10 MG 24 hr tablet " and she would like that to be sent to [facility] [place], [place] - [address] [place] [phone]

## 2017-11-13 NOTE — Telephone Encounter (Signed)
Request for ditropan xl request sent to Dr. Carlota Raspberry Med rx by another provider 2016

## 2017-11-13 NOTE — Telephone Encounter (Signed)
DitropanXL refill Last Refill:11/06/14  Expired Historical provider Last OV: 09/18/17 Med listed but problem not addressed PCP: [facility], [place], [facility]  Original Rx written Dr Kris Mouton in 2015 Refilled 04/11/14. By Dr Kris Mouton

## 2017-11-14 MED ORDER — OXYBUTYNIN CHLORIDE ER 10 MG PO TB24: 10.0000 mg | ORAL_TABLET | Freq: Every day | ORAL | 0 refills | Status: DC

## 2017-11-14 NOTE — Telephone Encounter (Signed)
Refilled, but please return to discuss that med and use prior to current Rx expiring. mychart message sent.

## 2017-12-18 ENCOUNTER — Other Ambulatory Visit: Payer: Self-pay | Admitting: Neurosurgery

## 2017-12-20 ENCOUNTER — Encounter (HOSPITAL_COMMUNITY): Payer: Self-pay | Admitting: *Deleted

## 2017-12-20 NOTE — Progress Notes (Signed)
Patient denies CP, shob, or cardiology visit.

## 2017-12-23 ENCOUNTER — Inpatient Hospital Stay (HOSPITAL_COMMUNITY): Payer: Medicare Other

## 2017-12-23 ENCOUNTER — Observation Stay (HOSPITAL_COMMUNITY)
Admission: RE | Admit: 2017-12-23 | Discharge: 2017-12-23 | Disposition: A | Discharge status: Home / Self Care | Payer: Medicare Other | Source: Ambulatory Visit | Attending: Neurosurgery | Admitting: Neurosurgery

## 2017-12-23 ENCOUNTER — Other Ambulatory Visit: Payer: Self-pay

## 2017-12-23 ENCOUNTER — Encounter (HOSPITAL_COMMUNITY): Payer: Self-pay

## 2017-12-23 ENCOUNTER — Inpatient Hospital Stay (HOSPITAL_COMMUNITY): Payer: Medicare Other | Admitting: Registered Nurse

## 2017-12-23 ENCOUNTER — Encounter (HOSPITAL_COMMUNITY)
Admission: RE | Disposition: A | Discharge status: Home / Self Care | Payer: Self-pay | Source: Ambulatory Visit | Attending: Neurosurgery

## 2017-12-23 DIAGNOSIS — M5137 Other intervertebral disc degeneration, lumbosacral region: Secondary | ICD-10-CM | POA: Insufficient documentation

## 2017-12-23 DIAGNOSIS — Z820 Family history of epilepsy and other diseases of the nervous system: Secondary | ICD-10-CM | POA: Diagnosis not present

## 2017-12-23 DIAGNOSIS — Z419 Encounter for procedure for purposes other than remedying health state, unspecified: Secondary | ICD-10-CM

## 2017-12-23 DIAGNOSIS — Z885 Allergy status to narcotic agent status: Secondary | ICD-10-CM | POA: Insufficient documentation

## 2017-12-23 DIAGNOSIS — Z79899 Other long term (current) drug therapy: Secondary | ICD-10-CM | POA: Diagnosis not present

## 2017-12-23 DIAGNOSIS — E119 Type 2 diabetes mellitus without complications: Secondary | ICD-10-CM | POA: Diagnosis not present

## 2017-12-23 DIAGNOSIS — E7800 Pure hypercholesterolemia, unspecified: Secondary | ICD-10-CM | POA: Insufficient documentation

## 2017-12-23 DIAGNOSIS — E039 Hypothyroidism, unspecified: Secondary | ICD-10-CM | POA: Diagnosis not present

## 2017-12-23 DIAGNOSIS — M48061 Spinal stenosis, lumbar region without neurogenic claudication: Secondary | ICD-10-CM | POA: Insufficient documentation

## 2017-12-23 DIAGNOSIS — F419 Anxiety disorder, unspecified: Secondary | ICD-10-CM | POA: Diagnosis not present

## 2017-12-23 DIAGNOSIS — K219 Gastro-esophageal reflux disease without esophagitis: Secondary | ICD-10-CM | POA: Diagnosis not present

## 2017-12-23 DIAGNOSIS — H9192 Unspecified hearing loss, left ear: Secondary | ICD-10-CM | POA: Diagnosis not present

## 2017-12-23 DIAGNOSIS — R413 Other amnesia: Secondary | ICD-10-CM | POA: Diagnosis not present

## 2017-12-23 DIAGNOSIS — M5412 Radiculopathy, cervical region: Secondary | ICD-10-CM | POA: Diagnosis not present

## 2017-12-23 DIAGNOSIS — Z823 Family history of stroke: Secondary | ICD-10-CM | POA: Insufficient documentation

## 2017-12-23 DIAGNOSIS — G5600 Carpal tunnel syndrome, unspecified upper limb: Secondary | ICD-10-CM | POA: Diagnosis not present

## 2017-12-23 DIAGNOSIS — Z833 Family history of diabetes mellitus: Secondary | ICD-10-CM | POA: Diagnosis not present

## 2017-12-23 DIAGNOSIS — Z7982 Long term (current) use of aspirin: Secondary | ICD-10-CM | POA: Diagnosis not present

## 2017-12-23 DIAGNOSIS — F329 Major depressive disorder, single episode, unspecified: Secondary | ICD-10-CM | POA: Diagnosis not present

## 2017-12-23 DIAGNOSIS — M7138 Other bursal cyst, other site: Secondary | ICD-10-CM | POA: Diagnosis present

## 2017-12-23 DIAGNOSIS — I10 Essential (primary) hypertension: Secondary | ICD-10-CM | POA: Insufficient documentation

## 2017-12-23 DIAGNOSIS — Z96641 Presence of right artificial hip joint: Secondary | ICD-10-CM | POA: Insufficient documentation

## 2017-12-23 DIAGNOSIS — Z90710 Acquired absence of both cervix and uterus: Secondary | ICD-10-CM | POA: Diagnosis not present

## 2017-12-23 DIAGNOSIS — Z888 Allergy status to other drugs, medicaments and biological substances status: Secondary | ICD-10-CM | POA: Insufficient documentation

## 2017-12-23 DIAGNOSIS — Z8673 Personal history of transient ischemic attack (TIA), and cerebral infarction without residual deficits: Secondary | ICD-10-CM | POA: Diagnosis not present

## 2017-12-23 HISTORY — PX: LUMBAR LAMINECTOMY/DECOMPRESSION MICRODISCECTOMY: SHX5026

## 2017-12-23 HISTORY — DX: Depression, unspecified: F32.A

## 2017-12-23 HISTORY — DX: Postconcussional syndrome: F07.81

## 2017-12-23 HISTORY — DX: Cerebral infarction, unspecified: I63.9

## 2017-12-23 HISTORY — DX: Carpal tunnel syndrome, unspecified upper limb: G56.00

## 2017-12-23 HISTORY — DX: Major depressive disorder, single episode, unspecified: F32.9

## 2017-12-23 HISTORY — DX: Other amnesia: R41.3

## 2017-12-23 HISTORY — DX: Anxiety disorder, unspecified: F41.9

## 2017-12-23 HISTORY — DX: Pure hypercholesterolemia, unspecified: E78.00

## 2017-12-23 LAB — CBC WITH DIFFERENTIAL/PLATELET
Abs Immature Granulocytes: 0.05 10*3/uL (ref 0.00–0.07)
Basophils Absolute: 0.1 10*3/uL (ref 0.0–0.1)
Basophils Relative: 1 %
Eosinophils Absolute: 0.1 10*3/uL (ref 0.0–0.5)
Eosinophils Relative: 2 %
HCT: 43.8 % (ref 36.0–46.0)
Hemoglobin: 13 g/dL (ref 12.0–15.0)
Immature Granulocytes: 1 %
Lymphocytes Relative: 42 %
Lymphs Abs: 2.7 10*3/uL (ref 0.7–4.0)
MCH: 24.9 pg — ABNORMAL LOW (ref 26.0–34.0)
MCHC: 29.7 g/dL — ABNORMAL LOW (ref 30.0–36.0)
MCV: 83.7 fL (ref 80.0–100.0)
Monocytes Absolute: 0.6 10*3/uL (ref 0.1–1.0)
Monocytes Relative: 9 %
Neutro Abs: 2.8 10*3/uL (ref 1.7–7.7)
Neutrophils Relative %: 45 %
Platelets: 262 10*3/uL (ref 150–400)
RBC: 5.23 MIL/uL — ABNORMAL HIGH (ref 3.87–5.11)
RDW: 18 % — ABNORMAL HIGH (ref 11.5–15.5)
WBC: 6.3 10*3/uL (ref 4.0–10.5)
nRBC: 0 % (ref 0.0–0.2)

## 2017-12-23 LAB — BASIC METABOLIC PANEL
Anion gap: 7 (ref 5–15)
BUN: 7 mg/dL — ABNORMAL LOW (ref 8–23)
CALCIUM: 9.1 mg/dL (ref 8.9–10.3)
CHLORIDE: 105 mmol/L (ref 98–111)
CO2: 27 mmol/L (ref 22–32)
CREATININE: 0.86 mg/dL (ref 0.44–1.00)
GFR calc Af Amer: >60 mL/min (ref >60)
GFR calc non Af Amer: >60 mL/min (ref >60)
Glucose, Bld: 114 mg/dL — ABNORMAL HIGH (ref 70–99)
Potassium: 3.4 mmol/L — ABNORMAL LOW (ref 3.5–5.1)
Sodium: 139 mmol/L (ref 135–145)

## 2017-12-23 LAB — GLUCOSE, CAPILLARY
Glucose-Capillary: 118 mg/dL — ABNORMAL HIGH (ref 70–99)
Glucose-Capillary: 129 mg/dL — ABNORMAL HIGH (ref 70–99)
Glucose-Capillary: 149 mg/dL — ABNORMAL HIGH (ref 70–99)

## 2017-12-23 LAB — HEMOGLOBIN A1C
Hgb A1c MFr Bld: 6.6 % — ABNORMAL HIGH (ref 4.8–5.6)
Mean Plasma Glucose: 142.72 mg/dL

## 2017-12-23 SURGERY — LUMBAR LAMINECTOMY/DECOMPRESSION MICRODISCECTOMY 1 LEVEL
Anesthesia: General | Site: Back | Laterality: Left

## 2017-12-23 MED ORDER — CEFAZOLIN SODIUM-DEXTROSE 2-4 GM/100ML-% IV SOLN
2.0000 g | INTRAVENOUS | Status: AC
  Administered 2017-12-23: 2 g via INTRAVENOUS
  Filled 2017-12-23: qty 100

## 2017-12-23 MED ORDER — CHLORHEXIDINE GLUCONATE CLOTH 2 % EX PADS: 6.0000 | MEDICATED_PAD | Freq: Once | CUTANEOUS | Status: DC

## 2017-12-23 MED ORDER — HYDROCODONE-ACETAMINOPHEN 5-325 MG PO TABS: 1.0000 | ORAL_TABLET | ORAL | 0 refills | Status: DC | PRN

## 2017-12-23 MED ORDER — GLYCOPYRROLATE PF 0.2 MG/ML IJ SOSY
PREFILLED_SYRINGE | INTRAMUSCULAR | Status: AC
  Filled 2017-12-23: qty 2

## 2017-12-23 MED ORDER — FENTANYL CITRATE (PF) 250 MCG/5ML IJ SOLN
INTRAMUSCULAR | Status: AC
  Filled 2017-12-23: qty 5

## 2017-12-23 MED ORDER — SODIUM CHLORIDE 0.9 % IV SOLN: 250.0000 mL | INTRAVENOUS | Status: DC

## 2017-12-23 MED ORDER — HYDROMORPHONE HCL 1 MG/ML IJ SOLN: 1.0000 mg | INTRAMUSCULAR | Status: DC | PRN

## 2017-12-23 MED ORDER — ESCITALOPRAM OXALATE 10 MG PO TABS: 10.0000 mg | ORAL_TABLET | Freq: Every day | ORAL | Status: DC

## 2017-12-23 MED ORDER — THROMBIN 5000 UNITS EX SOLR
CUTANEOUS | Status: DC | PRN
  Administered 2017-12-23 (×2): 5000 [IU] via TOPICAL

## 2017-12-23 MED ORDER — ONDANSETRON HCL 4 MG PO TABS: 4.0000 mg | ORAL_TABLET | Freq: Four times a day (QID) | ORAL | Status: DC | PRN

## 2017-12-23 MED ORDER — TRAZODONE HCL 50 MG PO TABS
50.0000 mg | ORAL_TABLET | Freq: Every day | ORAL | Status: DC
  Filled 2017-12-23: qty 1

## 2017-12-23 MED ORDER — MIDAZOLAM HCL 2 MG/2ML IJ SOLN
INTRAMUSCULAR | Status: AC
  Filled 2017-12-23: qty 2

## 2017-12-23 MED ORDER — EZETIMIBE 10 MG PO TABS: 10.0000 mg | ORAL_TABLET | Freq: Every day | ORAL | Status: DC

## 2017-12-23 MED ORDER — ACETAMINOPHEN 650 MG RE SUPP: 650.0000 mg | RECTAL | Status: DC | PRN

## 2017-12-23 MED ORDER — MEPERIDINE HCL 50 MG/ML IJ SOLN: 6.2500 mg | INTRAMUSCULAR | Status: DC | PRN

## 2017-12-23 MED ORDER — HYDROCODONE-ACETAMINOPHEN 10-325 MG PO TABS
2.0000 | ORAL_TABLET | ORAL | Status: DC | PRN
  Administered 2017-12-23: 2 via ORAL
  Filled 2017-12-23: qty 2

## 2017-12-23 MED ORDER — BUPIVACAINE HCL (PF) 0.25 % IJ SOLN
INTRAMUSCULAR | Status: DC | PRN
  Administered 2017-12-23: 20 mL

## 2017-12-23 MED ORDER — SODIUM CHLORIDE 0.9% FLUSH: 3.0000 mL | INTRAVENOUS | Status: DC | PRN

## 2017-12-23 MED ORDER — PANTOPRAZOLE SODIUM 40 MG PO TBEC: 40.0000 mg | DELAYED_RELEASE_TABLET | Freq: Every day | ORAL | Status: DC

## 2017-12-23 MED ORDER — CALCIUM CARBONATE-VITAMIN D 500-200 MG-UNIT PO TABS: ORAL_TABLET | Freq: Every day | ORAL | Status: DC

## 2017-12-23 MED ORDER — 0.9 % SODIUM CHLORIDE (POUR BTL) OPTIME
TOPICAL | Status: DC | PRN
  Administered 2017-12-23: 1000 mL

## 2017-12-23 MED ORDER — PHENOL 1.4 % MT LIQD: 1.0000 | OROMUCOSAL | Status: DC | PRN

## 2017-12-23 MED ORDER — SODIUM CHLORIDE 0.9% FLUSH: 3.0000 mL | Freq: Two times a day (BID) | INTRAVENOUS | Status: DC

## 2017-12-23 MED ORDER — LORATADINE 10 MG PO TABS: 10.0000 mg | ORAL_TABLET | Freq: Every day | ORAL | Status: DC | PRN

## 2017-12-23 MED ORDER — OXYBUTYNIN CHLORIDE ER 10 MG PO TB24: 10.0000 mg | ORAL_TABLET | Freq: Every day | ORAL | Status: DC

## 2017-12-23 MED ORDER — FENTANYL CITRATE (PF) 100 MCG/2ML IJ SOLN
INTRAMUSCULAR | Status: DC | PRN
  Administered 2017-12-23 (×4): 50 ug via INTRAVENOUS

## 2017-12-23 MED ORDER — DEXAMETHASONE SODIUM PHOSPHATE 10 MG/ML IJ SOLN
10.0000 mg | INTRAMUSCULAR | Status: AC
  Administered 2017-12-23: 10 mg via INTRAVENOUS
  Filled 2017-12-23: qty 1

## 2017-12-23 MED ORDER — BUPIVACAINE HCL (PF) 0.25 % IJ SOLN
INTRAMUSCULAR | Status: AC
  Filled 2017-12-23: qty 30

## 2017-12-23 MED ORDER — KETOROLAC TROMETHAMINE 15 MG/ML IJ SOLN
30.0000 mg | Freq: Four times a day (QID) | INTRAMUSCULAR | Status: DC
  Administered 2017-12-23: 30 mg via INTRAVENOUS
  Filled 2017-12-23: qty 2

## 2017-12-23 MED ORDER — EPHEDRINE SULFATE-NACL 50-0.9 MG/10ML-% IV SOSY
PREFILLED_SYRINGE | INTRAVENOUS | Status: DC | PRN
  Administered 2017-12-23 (×2): 10 mg via INTRAVENOUS

## 2017-12-23 MED ORDER — SIMVASTATIN 20 MG PO TABS: 40.0000 mg | ORAL_TABLET | Freq: Every day | ORAL | Status: DC

## 2017-12-23 MED ORDER — CYCLOBENZAPRINE HCL 10 MG PO TABS: 10.0000 mg | ORAL_TABLET | Freq: Three times a day (TID) | ORAL | 0 refills | Status: DC | PRN

## 2017-12-23 MED ORDER — FENTANYL CITRATE (PF) 100 MCG/2ML IJ SOLN
INTRAMUSCULAR | Status: AC
  Filled 2017-12-23: qty 2

## 2017-12-23 MED ORDER — CYCLOBENZAPRINE HCL 10 MG PO TABS
10.0000 mg | ORAL_TABLET | Freq: Three times a day (TID) | ORAL | Status: DC | PRN
  Administered 2017-12-23: 10 mg via ORAL
  Filled 2017-12-23: qty 1

## 2017-12-23 MED ORDER — KETOROLAC TROMETHAMINE 30 MG/ML IJ SOLN
INTRAMUSCULAR | Status: DC | PRN
  Administered 2017-12-23: 30 mg via INTRAVENOUS

## 2017-12-23 MED ORDER — LAMOTRIGINE 100 MG PO TABS: 100.0000 mg | ORAL_TABLET | Freq: Every day | ORAL | Status: DC

## 2017-12-23 MED ORDER — LACTATED RINGERS IV SOLN
INTRAVENOUS | Status: DC | PRN
  Administered 2017-12-23 (×2): via INTRAVENOUS

## 2017-12-23 MED ORDER — PROPOFOL 10 MG/ML IV BOLUS
INTRAVENOUS | Status: AC
  Filled 2017-12-23: qty 20

## 2017-12-23 MED ORDER — ONDANSETRON HCL 4 MG/2ML IJ SOLN
INTRAMUSCULAR | Status: DC | PRN
  Administered 2017-12-23: 4 mg via INTRAVENOUS

## 2017-12-23 MED ORDER — NEOSTIGMINE METHYLSULFATE 5 MG/5ML IV SOSY
PREFILLED_SYRINGE | INTRAVENOUS | Status: DC | PRN
  Administered 2017-12-23: 3 mg via INTRAVENOUS

## 2017-12-23 MED ORDER — ADULT MULTIVITAMIN W/MINERALS CH: 1.0000 | ORAL_TABLET | Freq: Every day | ORAL | Status: DC

## 2017-12-23 MED ORDER — SODIUM CHLORIDE 0.9 % IV SOLN
INTRAVENOUS | Status: DC | PRN
  Administered 2017-12-23: 500 mL

## 2017-12-23 MED ORDER — ONDANSETRON HCL 4 MG/2ML IJ SOLN: 4.0000 mg | Freq: Four times a day (QID) | INTRAMUSCULAR | Status: DC | PRN

## 2017-12-23 MED ORDER — LEVOTHYROXINE SODIUM 75 MCG PO TABS: 75.0000 ug | ORAL_TABLET | Freq: Every day | ORAL | Status: DC

## 2017-12-23 MED ORDER — PROPOFOL 10 MG/ML IV BOLUS
INTRAVENOUS | Status: DC | PRN
  Administered 2017-12-23: 20 mg via INTRAVENOUS
  Administered 2017-12-23: 150 mg via INTRAVENOUS

## 2017-12-23 MED ORDER — FENTANYL CITRATE (PF) 100 MCG/2ML IJ SOLN
25.0000 ug | INTRAMUSCULAR | Status: DC | PRN
  Administered 2017-12-23 (×2): 25 ug via INTRAVENOUS

## 2017-12-23 MED ORDER — ROCURONIUM BROMIDE 10 MG/ML (PF) SYRINGE
PREFILLED_SYRINGE | INTRAVENOUS | Status: DC | PRN
  Administered 2017-12-23: 50 mg via INTRAVENOUS

## 2017-12-23 MED ORDER — B COMPLEX-C PO TABS
ORAL_TABLET | Freq: Every day | ORAL | Status: DC
  Administered 2017-12-23: 1 via ORAL
  Filled 2017-12-23: qty 1

## 2017-12-23 MED ORDER — HYDROCODONE-ACETAMINOPHEN 5-325 MG PO TABS: 1.0000 | ORAL_TABLET | ORAL | Status: DC | PRN

## 2017-12-23 MED ORDER — METOCLOPRAMIDE HCL 5 MG/ML IJ SOLN: 10.0000 mg | Freq: Once | INTRAMUSCULAR | Status: DC | PRN

## 2017-12-23 MED ORDER — NEOSTIGMINE METHYLSULFATE 3 MG/3ML IV SOSY
PREFILLED_SYRINGE | INTRAVENOUS | Status: AC
  Filled 2017-12-23: qty 3

## 2017-12-23 MED ORDER — LIDOCAINE 2% (20 MG/ML) 5 ML SYRINGE
INTRAMUSCULAR | Status: DC | PRN
  Administered 2017-12-23: 100 mg via INTRAVENOUS

## 2017-12-23 MED ORDER — DEXAMETHASONE SODIUM PHOSPHATE 10 MG/ML IJ SOLN
INTRAMUSCULAR | Status: AC
  Filled 2017-12-23: qty 1

## 2017-12-23 MED ORDER — EPHEDRINE 5 MG/ML INJ
INTRAVENOUS | Status: AC
  Filled 2017-12-23: qty 10

## 2017-12-23 MED ORDER — KETOROLAC TROMETHAMINE 30 MG/ML IJ SOLN
INTRAMUSCULAR | Status: AC
  Filled 2017-12-23: qty 1

## 2017-12-23 MED ORDER — AMLODIPINE BESYLATE 5 MG PO TABS: 5.0000 mg | ORAL_TABLET | Freq: Every day | ORAL | Status: DC

## 2017-12-23 MED ORDER — ROCURONIUM BROMIDE 50 MG/5ML IV SOSY
PREFILLED_SYRINGE | INTRAVENOUS | Status: AC
  Filled 2017-12-23: qty 5

## 2017-12-23 MED ORDER — CEFAZOLIN SODIUM-DEXTROSE 1-4 GM/50ML-% IV SOLN
1.0000 g | Freq: Three times a day (TID) | INTRAVENOUS | Status: DC
  Administered 2017-12-23: 1 g via INTRAVENOUS
  Filled 2017-12-23: qty 50

## 2017-12-23 MED ORDER — ACETAMINOPHEN 325 MG PO TABS: 650.0000 mg | ORAL_TABLET | ORAL | Status: DC | PRN

## 2017-12-23 MED ORDER — THROMBIN 5000 UNITS EX SOLR
CUTANEOUS | Status: AC
  Filled 2017-12-23: qty 15000

## 2017-12-23 MED ORDER — ONDANSETRON HCL 4 MG/2ML IJ SOLN
INTRAMUSCULAR | Status: AC
  Filled 2017-12-23: qty 2

## 2017-12-23 MED ORDER — MENTHOL 3 MG MT LOZG: 1.0000 | LOZENGE | OROMUCOSAL | Status: DC | PRN

## 2017-12-23 MED ORDER — HEMOSTATIC AGENTS (NO CHARGE) OPTIME
TOPICAL | Status: DC | PRN
  Administered 2017-12-23: 1 via TOPICAL

## 2017-12-23 MED ORDER — GLYCOPYRROLATE PF 0.2 MG/ML IJ SOSY
PREFILLED_SYRINGE | INTRAMUSCULAR | Status: DC | PRN
  Administered 2017-12-23: 0.4 mg via INTRAVENOUS

## 2017-12-23 MED ORDER — LIDOCAINE 2% (20 MG/ML) 5 ML SYRINGE
INTRAMUSCULAR | Status: AC
  Filled 2017-12-23: qty 5

## 2017-12-23 SURGICAL SUPPLY — 58 items
BAG DECANTER FOR FLEXI CONT (MISCELLANEOUS) ×3 IMPLANT
BENZOIN TINCTURE PRP APPL 2/3 (GAUZE/BANDAGES/DRESSINGS) ×3 IMPLANT
BLADE CLIPPER SURG (BLADE) IMPLANT
BUR CUTTER 7.0 ROUND (BURR) ×3 IMPLANT
CANISTER SUCT 3000ML PPV (MISCELLANEOUS) ×3 IMPLANT
CARTRIDGE OIL MAESTRO DRILL (MISCELLANEOUS) ×1 IMPLANT
CLOSURE STERI-STRIP 1/2X4 (GAUZE/BANDAGES/DRESSINGS) ×1
CLOSURE WOUND 1/2 X4 (GAUZE/BANDAGES/DRESSINGS) ×1
CLSR STERI-STRIP ANTIMIC 1/2X4 (GAUZE/BANDAGES/DRESSINGS) ×2 IMPLANT
COVER WAND RF STERILE (DRAPES) ×3 IMPLANT
DECANTER SPIKE VIAL GLASS SM (MISCELLANEOUS) ×3 IMPLANT
DERMABOND ADVANCED (GAUZE/BANDAGES/DRESSINGS) ×2
DERMABOND ADVANCED .7 DNX12 (GAUZE/BANDAGES/DRESSINGS) ×1 IMPLANT
DIFFUSER DRILL AIR PNEUMATIC (MISCELLANEOUS) ×3 IMPLANT
DRAPE HALF SHEET 40X57 (DRAPES) ×3 IMPLANT
DRAPE LAPAROTOMY 100X72X124 (DRAPES) ×3 IMPLANT
DRAPE MICROSCOPE LEICA (MISCELLANEOUS) ×3 IMPLANT
DRAPE SURG 17X23 STRL (DRAPES) ×6 IMPLANT
DRSG OPSITE POSTOP 4X6 (GAUZE/BANDAGES/DRESSINGS) ×3 IMPLANT
DURAPREP 26ML APPLICATOR (WOUND CARE) ×3 IMPLANT
ELECT REM PT RETURN 9FT ADLT (ELECTROSURGICAL) ×3
ELECTRODE REM PT RTRN 9FT ADLT (ELECTROSURGICAL) ×1 IMPLANT
GAUZE 4X4 16PLY RFD (DISPOSABLE) IMPLANT
GAUZE SPONGE 4X4 12PLY STRL (GAUZE/BANDAGES/DRESSINGS) IMPLANT
GLOVE BIO SURGEON STRL SZ 6.5 (GLOVE) ×2 IMPLANT
GLOVE BIO SURGEONS STRL SZ 6.5 (GLOVE) ×1
GLOVE BIOGEL PI IND STRL 6.5 (GLOVE) ×1 IMPLANT
GLOVE BIOGEL PI IND STRL 7.0 (GLOVE) ×2 IMPLANT
GLOVE BIOGEL PI IND STRL 7.5 (GLOVE) ×2 IMPLANT
GLOVE BIOGEL PI IND STRL 8 (GLOVE) ×3 IMPLANT
GLOVE BIOGEL PI INDICATOR 6.5 (GLOVE) ×2
GLOVE BIOGEL PI INDICATOR 7.0 (GLOVE) ×4
GLOVE BIOGEL PI INDICATOR 7.5 (GLOVE) ×4
GLOVE BIOGEL PI INDICATOR 8 (GLOVE) ×6
GLOVE ECLIPSE 7.5 STRL STRAW (GLOVE) ×6 IMPLANT
GLOVE ECLIPSE 9.0 STRL (GLOVE) ×3 IMPLANT
GLOVE EXAM NITRILE XL STR (GLOVE) IMPLANT
GOWN STRL REUS W/ TWL LRG LVL3 (GOWN DISPOSABLE) ×1 IMPLANT
GOWN STRL REUS W/ TWL XL LVL3 (GOWN DISPOSABLE) ×1 IMPLANT
GOWN STRL REUS W/TWL 2XL LVL3 (GOWN DISPOSABLE) ×3 IMPLANT
GOWN STRL REUS W/TWL LRG LVL3 (GOWN DISPOSABLE) ×2
GOWN STRL REUS W/TWL XL LVL3 (GOWN DISPOSABLE) ×2
KIT BASIN OR (CUSTOM PROCEDURE TRAY) ×3 IMPLANT
KIT TURNOVER KIT B (KITS) ×3 IMPLANT
NEEDLE HYPO 22GX1.5 SAFETY (NEEDLE) ×3 IMPLANT
NEEDLE SPNL 22GX3.5 QUINCKE BK (NEEDLE) ×3 IMPLANT
NS IRRIG 1000ML POUR BTL (IV SOLUTION) ×3 IMPLANT
OIL CARTRIDGE MAESTRO DRILL (MISCELLANEOUS) ×3
PACK LAMINECTOMY NEURO (CUSTOM PROCEDURE TRAY) ×3 IMPLANT
PAD ARMBOARD 7.5X6 YLW CONV (MISCELLANEOUS) ×9 IMPLANT
RUBBERBAND STERILE (MISCELLANEOUS) ×6 IMPLANT
SPONGE SURGIFOAM ABS GEL SZ50 (HEMOSTASIS) ×3 IMPLANT
STRIP CLOSURE SKIN 1/2X4 (GAUZE/BANDAGES/DRESSINGS) ×2 IMPLANT
SUT VIC AB 2-0 CT1 18 (SUTURE) ×3 IMPLANT
SUT VIC AB 3-0 SH 8-18 (SUTURE) ×3 IMPLANT
TOWEL GREEN STERILE (TOWEL DISPOSABLE) ×3 IMPLANT
TOWEL GREEN STERILE FF (TOWEL DISPOSABLE) ×3 IMPLANT
WATER STERILE IRR 1000ML POUR (IV SOLUTION) ×3 IMPLANT

## 2017-12-23 NOTE — Anesthesia Postprocedure Evaluation (Signed)
Anesthesia Post Note  Patient: Cheryl Cooper  Procedure(s) Performed: Laminectomy for facet/synovial cyst - left - Lumbar three-Lumbar four (Left Back)     Patient location during evaluation: PACU Anesthesia Type: General Level of consciousness: awake and alert Pain management: pain level controlled Vital Signs Assessment: post-procedure vital signs reviewed and stable Respiratory status: spontaneous breathing, nonlabored ventilation, respiratory function stable and patient connected to nasal cannula oxygen Cardiovascular status: blood pressure returned to baseline and stable Postop Assessment: no apparent nausea or vomiting Anesthetic complications: no    Last Vitals:  Vitals:   12/23/17 1114 12/23/17 1518  BP: 129/67 130/60  Pulse: 69 72  Resp: 18 19  Temp: (!) 36.4 C (!) 36.4 C  SpO2: 95% 92%    Last Pain:  Vitals:   12/23/17 1518  TempSrc: Oral  PainSc:                  Montez Hageman

## 2017-12-23 NOTE — Transfer of Care (Signed)
Immediate Anesthesia Transfer of Care Note  Patient: Cheryl Cooper  Procedure(s) Performed: Laminectomy for facet/synovial cyst - left - Lumbar three-Lumbar four (Left Back)  Patient Location: PACU  Anesthesia Type:General  Level of Consciousness: patient cooperative and responds to stimulation  Airway & Oxygen Therapy: Patient Spontanous Breathing and Patient connected to face mask oxygen  Post-op Assessment: Report given to RN, Post -op Vital signs reviewed and stable and Patient moving all extremities X 4  Post vital signs: Reviewed and stable  Last Vitals:  Vitals Value Taken Time  BP 129/65 12/23/2017  9:47 AM  Temp    Pulse 74 12/23/2017  9:51 AM  Resp 15 12/23/2017  9:51 AM  SpO2 91 % 12/23/2017  9:51 AM  Vitals shown include unvalidated device data.  Last Pain:  Vitals:   12/23/17 0635  TempSrc: Oral  PainSc:       Patients Stated Pain Goal: 1 (13/24/40 1027)  Complications: No apparent anesthesia complications

## 2017-12-23 NOTE — Progress Notes (Signed)
Patient is discharged from room 3C10 at this time. Alert and in stable condition. IV site d/c'd and instructions read to patient and spouse with understanding verbalized. Left unit via wheelchair with all belongings at side.  

## 2017-12-23 NOTE — H&P (Signed)
Cheryl Cooper is an 77 y.o. female.   Chief Complaint: Back and left leg pain HPI: 77 year old female with severe back and left lower extremity pain worsened with standing and ambulation.  Work-up demonstrates evidence of spondylosis with associated stenosis and a large left-sided L3-L4 synovial cyst causing severe compression upon the thecal sac and nerve roots.  Patient has failed conservative management and presents now for laminotomy and resection of synovial cyst in hopes of improving her symptoms.  Past Medical History:  Diagnosis Date  . Anxiety   . Carpal tunnel syndrome   . Deafness in left ear   . Depression   . Diabetes mellitus    diet controlled  . GERD (gastroesophageal reflux disease)   . Hypercholesteremia   . Hypertension   . Hypothyroid   . Memory loss    reports d/t concussion  . PONV (postoperative nausea and vomiting) yrs ago, none recent  . Post concussion syndrome   . Stroke [facility])    reports they were silent    Past Surgical History:  Procedure Laterality Date  . ABDOMINAL HYSTERECTOMY  1986  . BACK SURGERY  2010   lower  . left elobow surgery  2003  . left rotator cuff  2001  . r foot surgery  1995  . right hand surgery  2001  . right index finger surgery  2009  . SHOULDER OPEN ROTATOR CUFF REPAIR  11/21/2011   Procedure: ROTATOR CUFF REPAIR SHOULDER OPEN;  Surgeon: Johnn Hai, MD;  Location: [facility];  Service: Orthopedics;  Laterality: Right;  with subacromial decompression  . SHOULDER OPEN ROTATOR CUFF REPAIR Right 05/09/2016   Procedure: Right shoulder mini open revision rotator cuff repair, subacromial decompression;  Surgeon: Susa Day, MD;  Location: [facility];  Service: Orthopedics;  Laterality: Right;  . TOTAL HIP ARTHROPLASTY Right 04/24/2017   Procedure: RIGHT TOTAL HIP ARTHROPLASTY ANTERIOR APPROACH;  Surgeon: Rod Can, MD;  Location: [facility];  Service: Orthopedics;  Laterality: Right;  Needs RNFA    Family History  Problem  Relation Age of Onset  . Stroke Father   . Stroke Sister   . Stroke Brother   . Diabetes Brother   . Dementia Brother   . Stroke Sister        TIA   Social History:  reports that she has never smoked. She has never used smokeless tobacco. She reports that she drinks about 1.0 standard drinks of alcohol per week. She reports that she does not use drugs.  Allergies:  Allergies  Allergen Reactions  . Other Other (See Comments)    Tomato sauce, garlic, onion - severe acid reflux   . Atorvastatin Other (See Comments)    Unbalanced  . Chlordiazepoxide-Clidinium Other (See Comments)    Dizziness (intolerance)  . Codeine Nausea Only    Medications Prior to Admission  Medication Sig Dispense Refill  . alendronate (FOSAMAX) 70 MG tablet Take 70 mg by mouth every Monday. Take with a full glass of water on an empty stomach.     [name] amLODipine (NORVASC) 5 MG tablet Take 5 mg by mouth daily.     [name] aspirin 81 MG chewable tablet Chew 1 tablet (81 mg total) by mouth 2 (two) times daily. (Patient taking differently: Chew 81 mg by mouth 2 (two) times a week. ) 60 tablet 1  . Calcium Carbonate-Vitamin D (CALCIUM-D PO) Take 2 tablets by mouth daily.    [name] escitalopram (LEXAPRO) 10 MG tablet Take 10 mg by  mouth daily.   3  . esomeprazole (NEXIUM) 40 MG capsule Take 40 mg by mouth daily.   7  . ezetimibe (ZETIA) 10 MG tablet Take 1 tablet (10 mg total) by mouth daily. 90 tablet 1  . lamoTRIgine (LAMICTAL) 100 MG tablet Take 100 mg by mouth daily.     [name] levothyroxine (SYNTHROID, LEVOTHROID) 75 MCG tablet Take 75 mcg by mouth daily with breakfast.    . Multiple Vitamin (MULTIVITAMIN WITH MINERALS) TABS tablet Take 1 tablet by mouth daily.    . naproxen sodium (ALEVE) 220 MG tablet Take 220-440 mg by mouth daily as needed (pain).    [name] oxybutynin (DITROPAN-XL) 10 MG 24 hr tablet Take 1 tablet (10 mg total) by mouth daily. 90 tablet 0  . simvastatin (ZOCOR) 40 MG tablet Take 1 tablet (40 mg total) by mouth at  bedtime. 90 tablet 1  . SUPER B COMPLEX/C PO Take 1 tablet by mouth daily.    . traZODone (DESYREL) 50 MG tablet Take 50 mg by mouth at bedtime.    . hydrOXYzine (ATARAX/VISTARIL) 10 MG tablet Take 1 tablet (10 mg total) by mouth at bedtime as needed for anxiety (and sleep.). (Patient not taking: Reported on 12/19/2017) 30 tablet 0  . loratadine (CLARITIN) 10 MG tablet Take 10 mg by mouth daily as needed for allergies.    [name] nystatin-triamcinolone ointment (MYCOLOG) Apply 1 application topically 2 (two) times daily. (Patient not taking: Reported on 12/19/2017) 30 g 0    Results for orders placed or performed during the hospital encounter of 12/23/17 (from the past 48 hour(s))  CBC WITH DIFFERENTIAL     Status: Abnormal   Collection Time: 12/23/17  6:34 AM  Result Value Ref Range   WBC 6.3 4.0 - 10.5 K/uL   RBC 5.23 (H) 3.87 - 5.11 MIL/uL   Hemoglobin 13.0 12.0 - 15.0 g/dL   HCT 43.8 36.0 - 46.0 %   MCV 83.7 80.0 - 100.0 fL   MCH 24.9 (L) 26.0 - 34.0 pg   MCHC 29.7 (L) 30.0 - 36.0 g/dL   RDW 18.0 (H) 11.5 - 15.5 %   Platelets 262 150 - 400 K/uL   nRBC 0.0 0.0 - 0.2 %   Neutrophils Relative % 45 %   Neutro Abs 2.8 1.7 - 7.7 K/uL   Lymphocytes Relative 42 %   Lymphs Abs 2.7 0.7 - 4.0 K/uL   Monocytes Relative 9 %   Monocytes Absolute 0.6 0.1 - 1.0 K/uL   Eosinophils Relative 2 %   Eosinophils Absolute 0.1 0.0 - 0.5 K/uL   Basophils Relative 1 %   Basophils Absolute 0.1 0.0 - 0.1 K/uL   Immature Granulocytes 1 %   Abs Immature Granulocytes 0.05 0.00 - 0.07 K/uL    Comment: Performed at [facility], [address] [place], [place] [address]  Hemoglobin A1c     Status: Abnormal   Collection Time: 12/23/17  6:34 AM  Result Value Ref Range   Hgb A1c MFr Bld 6.6 (H) 4.8 - 5.6 %    Comment: (NOTE) Pre diabetes:          5.7%-6.4% Diabetes:              >6.4% Glycemic control for   <7.0% adults with diabetes    Mean Plasma Glucose 142.72 mg/dL    Comment: Performed at [facility] [address] [place], [place] [address]  Glucose, capillary     Status:  Abnormal   Collection Time: 12/23/17  6:38 AM  Result Value Ref Range   Glucose-Capillary 118 (H) 70 - 99 mg/dL   No results found.  Pertinent items noted in HPI and remainder of comprehensive ROS otherwise negative.  Blood pressure (!) 173/72, pulse 63, temperature 98.4 F (36.9 C), temperature source Oral, resp. rate 20, SpO2 99 %.  Patient is awake and alert.  She is oriented and appropriate.  Speech is fluent.  Judgment and insight are intact.  Cranial nerve function normal bilateral.  Motor exam reveals weakness of her left-sided hip flexors grading out at 4/5.  She also has weakness of her left-sided quadriceps muscle group grading at 4-/5 with some wasting.  Sensory examination with decreased sensation pinprick light touch in her left L4 dermatome.  Deep tender mixes normal active.  Except her left patellar reflexes diminished.  Achilles reflexes are absent bilaterally.  Examination head ears eyes no throats unremarkable her chest and abdomen are benign.  Extremities are free from injury deformity. Assessment/Plan Left L3-L4 synovial cyst with radiculopathy.  Plan left L3-4 laminotomy and resection of synovial cyst.  Risks and benefits of been explained.  Patient wishes to proceed.  Mallie Mussel A Cephas Revard 12/23/2017, 7:37 AM

## 2017-12-23 NOTE — Anesthesia Preprocedure Evaluation (Signed)
Anesthesia Evaluation  Patient identified by MRN, date of birth, ID band Patient awake    Reviewed: Allergy & Precautions, NPO status , Patient's Chart, lab work & pertinent test results  History of Anesthesia Complications (+) PONV  Airway Mallampati: II  TM Distance: >3 FB Neck ROM: Full    Dental no notable dental hx.    Pulmonary neg pulmonary ROS,    Pulmonary exam normal breath sounds clear to auscultation       Cardiovascular hypertension, Pt. on medications Normal cardiovascular exam Rhythm:Regular Rate:Normal     Neuro/Psych CVA, No Residual Symptoms negative psych ROS   GI/Hepatic negative GI ROS, Neg liver ROS,   Endo/Other  diabetes, Type 2  Renal/GU negative Renal ROS  negative genitourinary   Musculoskeletal negative musculoskeletal ROS (+)   Abdominal   Peds negative pediatric ROS (+)  Hematology negative hematology ROS (+)   Anesthesia Other Findings   Reproductive/Obstetrics negative OB ROS                             Anesthesia Physical Anesthesia Plan  ASA: II  Anesthesia Plan: General   Post-op Pain Management:    Induction: Intravenous  PONV Risk Score and Plan: 4 or greater and Ondansetron, Treatment may vary due to age or medical condition and Dexamethasone  Airway Management Planned: Oral ETT  Additional Equipment:   Intra-op Plan:   Post-operative Plan: Extubation in OR  Informed Consent: I have reviewed the patients History and Physical, chart, labs and discussed the procedure including the risks, benefits and alternatives for the proposed anesthesia with the patient or authorized representative who has indicated his/her understanding and acceptance.   Dental advisory given  Plan Discussed with: CRNA  Anesthesia Plan Comments:        Anesthesia Quick Evaluation

## 2017-12-23 NOTE — Op Note (Signed)
Date of procedure: 12/23/2017  Date of dictation: Same  Service: Neurosurgery  Preoperative diagnosis: Left L3-L4 synovial cyst with radiculopathy  Postoperative diagnosis: Same  Procedure Name: Left L3-L4 decompressive laminotomy with resection of adherent synovial cyst, microdissection  Surgeon:Mischele Detter A.Dazja Houchin, M.D.  Asst. Surgeon: Reinaldo Meeker, NP  Anesthesia: General  Indication: 77 year old female with severe back and left lower extremity pain failing conservative management.  Work-up demonstrates evidence of a large leftward L3-4 dorsal synovial cyst causing marked compression of the thecal sac and left L4 nerve root.  Patient is failed conservative management and presents now for laminotomy and resection of cyst.  Operative note: After induction anesthesia, patient position prone on the Wilson frame and properly padded.  Lumbar region prepped and draped sterilely.  Incision made overlying L3-4.  Dissection performed on the left.  Retractor placed.  X-ray taken in the L4-5 level was exposed.  Dissection redirected one level cephalad.  Retractor replaced.  Laminotomy then performed using high-speed drill and Kerrison Rogers.  Ligament flavum elevated and resected.  Microscope brought in field using microdissection of the spinal canal.  The cyst was tightly adherent to the underlying thecal sac and left L4 nerve root.  This was gradually dissected free and completely resected.  There was no evidence of injury to thecal sac or nerve roots.  Spinal canal was further explored and there was no evidence of any residual compression.  Wound was then irrigated with fanlike solution.  Gelfoam was placed topically for hemostasis.  Wound is then closed in layers with Vicryl sutures.  Steri-Strips and sterile dressing were applied.  No apparent complications.  Patient tolerated the procedure well and she returns to the recovery room postop.

## 2017-12-23 NOTE — Brief Op Note (Signed)
12/23/2017  9:33 AM  PATIENT:  [name]  77 y.o. female  PRE-OPERATIVE DIAGNOSIS:  Synovial cyst  POST-OPERATIVE DIAGNOSIS:  Synovial cyst  PROCEDURE:  Procedure(s): Laminectomy for facet/synovial cyst - left - Lumbar three-Lumbar four (Left)  SURGEON:  Surgeon(s) and Role:    Earnie Larsson, MD - Primary  PHYSICIAN ASSISTANT:   ASSISTANTS: [place]    ANESTHESIA:   general  EBL:  75 cc   BLOOD ADMINISTERED:none  DRAINS: none   LOCAL MEDICATIONS USED:  MARCAINE     SPECIMEN:  No Specimen  DISPOSITION OF SPECIMEN:  N/A  COUNTS:  YES  TOURNIQUET:  * No tourniquets in log *  DICTATION: .Dragon Dictation  PLAN OF CARE: Admit to inpatient   PATIENT DISPOSITION:  PACU - hemodynamically stable.   Delay start of Pharmacological VTE agent (>24hrs) due to surgical blood loss or risk of bleeding: yes

## 2017-12-23 NOTE — Discharge Summary (Signed)
Physician Discharge Summary  Patient ID: Cheryl Cooper MRN: [id number] DOB/AGE: [birth year] 77 y.o.  Admit date: 12/23/2017 Discharge date: 12/23/2017  Admission Diagnoses:  Discharge Diagnoses:  Active Problems:   Synovial cyst of lumbar facet joint   Discharged Condition: good  Hospital Course: Patient admitted to the hospital where she underwent uncomplicated lumbar decompressive surgery with resection of synovial cyst.  Postoperative patient doing very well.  Preoperative back and lower extremity pain much improved.  Standing and walking without difficulty.  Ready for discharge home.  Consults:   Significant Diagnostic Studies:   Treatments:   Discharge Exam: Blood pressure 129/67, pulse 69, temperature (!) 97.5 F (36.4 C), temperature source Oral, resp. rate 18, SpO2 95 %. Awake and alert.  Oriented and appropriate.  Motor and sensory function intact.  Wound clean and dry.  Chest and abdomen benign.  Disposition: Discharge disposition: 01-Home or Self Care        Allergies as of 12/23/2017      Reactions   Other Other (See Comments)   Tomato sauce, garlic, onion - severe acid reflux    Atorvastatin Other (See Comments)   Unbalanced   Chlordiazepoxide-clidinium Other (See Comments)   Dizziness (intolerance)   Codeine Nausea Only      Medication List    TAKE these medications   alendronate 70 MG tablet Commonly known as:  FOSAMAX Take 70 mg by mouth every Monday. Take with a full glass of water on an empty stomach.   amLODipine 5 MG tablet Commonly known as:  NORVASC Take 5 mg by mouth daily.   aspirin 81 MG chewable tablet Chew 1 tablet (81 mg total) by mouth 2 (two) times daily. What changed:  when to take this   CALCIUM-D PO Take 2 tablets by mouth daily.   cyclobenzaprine 10 MG tablet Commonly known as:  FLEXERIL Take 1 tablet (10 mg total) by mouth 3 (three) times daily as needed for muscle spasms.   escitalopram 10 MG tablet Commonly known  as:  LEXAPRO Take 10 mg by mouth daily.   esomeprazole 40 MG capsule Commonly known as:  NEXIUM Take 40 mg by mouth daily.   ezetimibe 10 MG tablet Commonly known as:  ZETIA Take 1 tablet (10 mg total) by mouth daily.   HYDROcodone-acetaminophen 5-325 MG tablet Commonly known as:  NORCO/VICODIN Take 1-2 tablets by mouth every 4 (four) hours as needed for moderate pain ((score 4 to 6)).   hydrOXYzine 10 MG tablet Commonly known as:  ATARAX/VISTARIL Take 1 tablet (10 mg total) by mouth at bedtime as needed for anxiety (and sleep.).   lamoTRIgine 100 MG tablet Commonly known as:  LAMICTAL Take 100 mg by mouth daily.   levothyroxine 75 MCG tablet Commonly known as:  SYNTHROID, LEVOTHROID Take 75 mcg by mouth daily with breakfast.   loratadine 10 MG tablet Commonly known as:  CLARITIN Take 10 mg by mouth daily as needed for allergies.   multivitamin with minerals Tabs tablet Take 1 tablet by mouth daily.   naproxen sodium 220 MG tablet Commonly known as:  ALEVE Take 220-440 mg by mouth daily as needed (pain).   nystatin-triamcinolone ointment Commonly known as:  MYCOLOG Apply 1 application topically 2 (two) times daily.   oxybutynin 10 MG 24 hr tablet Commonly known as:  DITROPAN-XL Take 1 tablet (10 mg total) by mouth daily.   simvastatin 40 MG tablet Commonly known as:  ZOCOR Take 1 tablet (40 mg total) by mouth at bedtime.  SUPER B COMPLEX/C PO Take 1 tablet by mouth daily.   traZODone 50 MG tablet Commonly known as:  DESYREL Take 50 mg by mouth at bedtime.        Signed: Cooper Render Gregori Abril 12/23/2017, 2:26 PM

## 2017-12-23 NOTE — Discharge Instructions (Signed)
Wound Care Keep incision covered and dry for two days.   Do not put any creams, lotions, or ointments on incision. Leave steri-strips on back.  They will fall off by themselves.  Activity Walk each and every day, increasing distance each day. No lifting greater than 5 lbs.  Avoid excessive neck motion. No driving for 2 weeks; may ride as a passenger locally.  Diet Resume your normal diet.   Return to Work Will be discussed at you follow up appointment.  Call Your Doctor If Any of These Occur Redness, drainage, or swelling at the wound.  Temperature greater than 101 degrees. Severe pain not relieved by pain medication. Incision starts to come apart. Follow Up Appt Call today for appointment in 1-2 weeks ([phone]) or for problems.  If you have any hardware placed in your spine, you will need an x-ray before your appointment.  

## 2017-12-24 ENCOUNTER — Encounter (HOSPITAL_COMMUNITY): Payer: Self-pay | Admitting: Neurosurgery

## 2017-12-30 ENCOUNTER — Encounter: Payer: Self-pay | Admitting: Family Medicine

## 2017-12-30 ENCOUNTER — Other Ambulatory Visit: Payer: Self-pay

## 2017-12-30 ENCOUNTER — Ambulatory Visit: Payer: Medicare Other | Admitting: Family Medicine

## 2017-12-30 VITALS — BP 127/76 | HR 66 | Temp 98.4°F | Resp 16 | Ht 63.0 in | Wt 154.2 lb

## 2017-12-30 DIAGNOSIS — J029 Acute pharyngitis, unspecified: Secondary | ICD-10-CM

## 2017-12-30 DIAGNOSIS — T884XXD Failed or difficult intubation, subsequent encounter: Secondary | ICD-10-CM

## 2017-12-30 NOTE — Patient Instructions (Addendum)
  Saltwater Mouthwash 16-ounce bottle of water  1 teaspoon salt  1 teaspoon baking soda Keep a bottle by bathroom and kitchen sinks. Rinse, gargle, and spit after meals, before bed, and anytime your mouth feels dry or sore.    If you have lab work done today you will be contacted with your lab results within the next 2 weeks.  If you have not heard from [place] then please contact us. The fastest way to get your results is to register for My Chart.   IF you received an x-ray today, you will receive an invoice from [facility]. Please contact [facility] at [phone] with questions or concerns regarding your invoice.   IF you received labwork today, you will receive an invoice from [facility]. Please contact [facility] at [phone] with questions or concerns regarding your invoice.   Our billing staff will not be able to assist you with questions regarding bills from these companies.  You will be contacted with the lab results as soon as they are available. The fastest way to get your results is to activate your My Chart account. Instructions are located on the last page of this paperwork. If you have not heard from [place] regarding the results in 2 weeks, please contact this office.

## 2017-12-30 NOTE — Progress Notes (Signed)
Chief Complaint  Patient presents with  . sore throat since last after having surgery    Per pt throat is really dry.    HPI   Pt is here for complaint of dry throat since her surgery on 12/23/17 Based on the notes the patient had an ET tube placed on 12/23/17  She reports that her throat is dry and she has some soreness She does not wheeze or have difficulty drinking water   Past Medical History:  Diagnosis Date  . Anxiety   . Carpal tunnel syndrome   . Deafness in left ear   . Depression   . Diabetes mellitus    diet controlled  . GERD (gastroesophageal reflux disease)   . Hypercholesteremia   . Hypertension   . Hypothyroid   . Memory loss    reports d/t concussion  . PONV (postoperative nausea and vomiting) yrs ago, none recent  . Post concussion syndrome   . Stroke [facility])    reports they were silent    Current Outpatient Medications  Medication Sig Dispense Refill  . alendronate (FOSAMAX) 70 MG tablet Take 70 mg by mouth every Monday. Take with a full glass of water on an empty stomach.     [name] amLODipine (NORVASC) 5 MG tablet Take 5 mg by mouth daily.     [name] aspirin 81 MG chewable tablet Chew 1 tablet (81 mg total) by mouth 2 (two) times daily. (Patient taking differently: Chew 81 mg by mouth 2 (two) times a week. ) 60 tablet 1  . Calcium Carbonate-Vitamin D (CALCIUM-D PO) Take 2 tablets by mouth daily.    [name] escitalopram (LEXAPRO) 10 MG tablet Take 10 mg by mouth daily.   3  . esomeprazole (NEXIUM) 40 MG capsule Take 40 mg by mouth daily.   7  . ezetimibe (ZETIA) 10 MG tablet Take 1 tablet (10 mg total) by mouth daily. 90 tablet 1  . hydrOXYzine (ATARAX/VISTARIL) 10 MG tablet Take 1 tablet (10 mg total) by mouth at bedtime as needed for anxiety (and sleep.). 30 tablet 0  . lamoTRIgine (LAMICTAL) 100 MG tablet Take 100 mg by mouth daily.     [name] levothyroxine (SYNTHROID, LEVOTHROID) 75 MCG tablet Take 75 mcg by mouth daily with breakfast.    . Multiple Vitamin  (MULTIVITAMIN WITH MINERALS) TABS tablet Take 1 tablet by mouth daily.    [name] oxybutynin (DITROPAN-XL) 10 MG 24 hr tablet Take 1 tablet (10 mg total) by mouth daily. 90 tablet 0  . simvastatin (ZOCOR) 40 MG tablet Take 1 tablet (40 mg total) by mouth at bedtime. 90 tablet 1  . SUPER B COMPLEX/C PO Take 1 tablet by mouth daily.    . traZODone (DESYREL) 50 MG tablet Take 50 mg by mouth at bedtime.    . cyclobenzaprine (FLEXERIL) 10 MG tablet Take 1 tablet (10 mg total) by mouth 3 (three) times daily as needed for muscle spasms. (Patient not taking: Reported on 12/30/2017) 30 tablet 0  . HYDROcodone-acetaminophen (NORCO/VICODIN) 5-325 MG tablet Take 1-2 tablets by mouth every 4 (four) hours as needed for moderate pain ((score 4 to 6)). (Patient not taking: Reported on 12/30/2017) 40 tablet 0  . loratadine (CLARITIN) 10 MG tablet Take 10 mg by mouth daily as needed for allergies.    . naproxen sodium (ALEVE) 220 MG tablet Take 220-440 mg by mouth daily as needed (pain).    . nystatin-triamcinolone ointment (MYCOLOG) Apply 1 application topically 2 (two) times daily. (Patient not taking:  Reported on 12/19/2017) 30 g 0   No current facility-administered medications for this visit.     Allergies:  Allergies  Allergen Reactions  . Other Other (See Comments)    Tomato sauce, garlic, onion - severe acid reflux   . Atorvastatin Other (See Comments)    Unbalanced  . Chlordiazepoxide-Clidinium Other (See Comments)    Dizziness (intolerance)  . Codeine Nausea Only    Past Surgical History:  Procedure Laterality Date  . ABDOMINAL HYSTERECTOMY  1986  . BACK SURGERY  2010   lower  . left elobow surgery  2003  . left rotator cuff  2001  . LUMBAR LAMINECTOMY/DECOMPRESSION MICRODISCECTOMY Left 12/23/2017   Procedure: Laminectomy for facet/synovial cyst - left - Lumbar three-Lumbar four;  Surgeon: Earnie Larsson, MD;  Location: [facility];  Service: Neurosurgery;  Laterality: Left;  . r foot surgery  1995  .  right hand surgery  2001  . right index finger surgery  2009  . SHOULDER OPEN ROTATOR CUFF REPAIR  11/21/2011   Procedure: ROTATOR CUFF REPAIR SHOULDER OPEN;  Surgeon: Johnn Hai, MD;  Location: [facility];  Service: Orthopedics;  Laterality: Right;  with subacromial decompression  . SHOULDER OPEN ROTATOR CUFF REPAIR Right 05/09/2016   Procedure: Right shoulder mini open revision rotator cuff repair, subacromial decompression;  Surgeon: Susa Day, MD;  Location: [facility];  Service: Orthopedics;  Laterality: Right;  . TOTAL HIP ARTHROPLASTY Right 04/24/2017   Procedure: RIGHT TOTAL HIP ARTHROPLASTY ANTERIOR APPROACH;  Surgeon: Rod Can, MD;  Location: [facility];  Service: Orthopedics;  Laterality: Right;  Needs RNFA    Social History   Socioeconomic History  . Marital status: Married    Spouse name: Herschel  . Number of children: 3  . Years of education: 63  . Highest education level: Not on file  Occupational History    Comment: retired  Scientific laboratory technician  . Financial resource strain: Not on file  . Food insecurity:    Worry: Not on file    Inability: Not on file  . Transportation needs:    Medical: Not on file    Non-medical: Not on file  Tobacco Use  . Smoking status: Never Smoker  . Smokeless tobacco: Never Used  Substance and Sexual Activity  . Alcohol use: Yes    Alcohol/week: 1.0 standard drinks    Types: 1 Glasses of wine per week    Comment: glass wine/day  . Drug use: No  . Sexual activity: Not Currently    Comment: 1st intercourse 77 yo-Fewer than 5 partners  Lifestyle  . Physical activity:    Days per week: Not on file    Minutes per session: Not on file  . Stress: Not on file  Relationships  . Social connections:    Talks on phone: Not on file    Gets together: Not on file    Attends religious service: Not on file    Active member of club or organization: Not on file    Attends meetings of clubs or organizations: Not on file    Relationship status: Not on  file  Other Topics Concern  . Not on file  Social History Narrative   Married, lives at home with husband   caffeine - 1 Coke daily    Family History  Problem Relation Age of Onset  . Stroke Father   . Stroke Sister   . Stroke Brother   . Diabetes Brother   . Dementia Brother   .  Stroke Sister        TIA     ROS Review of Systems See HPI Constitution: No fevers or chills No malaise No diaphoresis Skin: No rash or itching Eyes: no blurry vision, no double vision GU: no dysuria or hematuria Neuro: no dizziness or headaches all others reviewed and negative   Objective: Vitals:   12/30/17 1137  BP: 127/76  Pulse: 66  Resp: 16  Temp: 98.4 F (36.9 C)  TempSrc: Oral  SpO2: 94%  Weight: 154 lb 3.2 oz (69.9 kg)  Height: 5\' 3"  (1.6 m)    Physical Exam  General: alert, oriented, in NAD Head: normocephalic, atraumatic, no sinus tenderness Eyes: EOM intact, no scleral icterus or conjunctival injection Ears: TM clear bilaterally Nose: mucosa nonerythematous, nonedematous Throat: no pharyngeal exudate or erythema Lymph: no posterior auricular, submental or cervical lymph adenopathy Heart: normal rate, normal sinus rhythm, no murmurs Lungs: clear to auscultation bilaterally, no wheezing   Assessment and Plan Evella was seen today for sore throat since last after having surgery.  Diagnoses and all orders for this visit:  Difficult airway for intubation, subsequent encounter  Sore throat  -  Discussed ET and the irritation that can result At this point there is no vocal cord paralysis Wrote a handwritten prescription for [place]

## 2018-01-05 ENCOUNTER — Other Ambulatory Visit: Payer: Self-pay

## 2018-01-05 ENCOUNTER — Emergency Department (HOSPITAL_COMMUNITY)
Admission: EM | Admit: 2018-01-05 | Discharge: 2018-01-05 | Disposition: A | Discharge status: Home / Self Care | Payer: Medicare Other | Attending: Emergency Medicine | Admitting: Emergency Medicine

## 2018-01-05 ENCOUNTER — Encounter (HOSPITAL_COMMUNITY): Payer: Self-pay | Admitting: Emergency Medicine

## 2018-01-05 DIAGNOSIS — E119 Type 2 diabetes mellitus without complications: Secondary | ICD-10-CM | POA: Insufficient documentation

## 2018-01-05 DIAGNOSIS — Z7982 Long term (current) use of aspirin: Secondary | ICD-10-CM | POA: Insufficient documentation

## 2018-01-05 DIAGNOSIS — E039 Hypothyroidism, unspecified: Secondary | ICD-10-CM | POA: Insufficient documentation

## 2018-01-05 DIAGNOSIS — D692 Other nonthrombocytopenic purpura: Secondary | ICD-10-CM | POA: Diagnosis not present

## 2018-01-05 DIAGNOSIS — Z79899 Other long term (current) drug therapy: Secondary | ICD-10-CM | POA: Diagnosis not present

## 2018-01-05 DIAGNOSIS — R21 Rash and other nonspecific skin eruption: Secondary | ICD-10-CM

## 2018-01-05 DIAGNOSIS — Z8673 Personal history of transient ischemic attack (TIA), and cerebral infarction without residual deficits: Secondary | ICD-10-CM | POA: Insufficient documentation

## 2018-01-05 DIAGNOSIS — I10 Essential (primary) hypertension: Secondary | ICD-10-CM | POA: Diagnosis not present

## 2018-01-05 LAB — CBC WITH DIFFERENTIAL/PLATELET
ABS IMMATURE GRANULOCYTES: 0.04 10*3/uL (ref 0.00–0.07)
BASOS PCT: 1 %
Basophils Absolute: 0 10*3/uL (ref 0.0–0.1)
Eosinophils Absolute: 0.1 10*3/uL (ref 0.0–0.5)
Eosinophils Relative: 2 %
HCT: 36.3 % (ref 36.0–46.0)
HEMOGLOBIN: 10.9 g/dL — AB (ref 12.0–15.0)
IMMATURE GRANULOCYTES: 1 %
LYMPHS ABS: 1.9 10*3/uL (ref 0.7–4.0)
LYMPHS PCT: 33 %
MCH: 25.8 pg — ABNORMAL LOW (ref 26.0–34.0)
MCHC: 30 g/dL (ref 30.0–36.0)
MCV: 85.8 fL (ref 80.0–100.0)
MONOS PCT: 8 %
Monocytes Absolute: 0.5 10*3/uL (ref 0.1–1.0)
NEUTROS ABS: 3.3 10*3/uL (ref 1.7–7.7)
NEUTROS PCT: 55 %
PLATELETS: 370 10*3/uL (ref 150–400)
RBC: 4.23 MIL/uL (ref 3.87–5.11)
RDW: 17.4 % — ABNORMAL HIGH (ref 11.5–15.5)
WBC: 5.9 10*3/uL (ref 4.0–10.5)
nRBC: 0 % (ref 0.0–0.2)

## 2018-01-05 LAB — COMPREHENSIVE METABOLIC PANEL
ALT: 24 U/L (ref 0–44)
AST: 32 U/L (ref 15–41)
Albumin: 2.9 g/dL — ABNORMAL LOW (ref 3.5–5.0)
Alkaline Phosphatase: 46 U/L (ref 38–126)
Anion gap: 7 (ref 5–15)
BUN: 8 mg/dL (ref 8–23)
CHLORIDE: 104 mmol/L (ref 98–111)
CO2: 27 mmol/L (ref 22–32)
CREATININE: 0.86 mg/dL (ref 0.44–1.00)
Calcium: 8.5 mg/dL — ABNORMAL LOW (ref 8.9–10.3)
GFR calc Af Amer: >60 mL/min (ref >60)
GFR calc non Af Amer: >60 mL/min (ref >60)
Glucose, Bld: 113 mg/dL — ABNORMAL HIGH (ref 70–99)
Potassium: 3.7 mmol/L (ref 3.5–5.1)
Sodium: 138 mmol/L (ref 135–145)
Total Bilirubin: 0.6 mg/dL (ref 0.3–1.2)
Total Protein: 6.3 g/dL — ABNORMAL LOW (ref 6.5–8.1)

## 2018-01-05 LAB — SEDIMENTATION RATE: Sed Rate: 20 mm/hr (ref 0–22)

## 2018-01-05 LAB — C-REACTIVE PROTEIN: CRP: 1.6 mg/dL — ABNORMAL HIGH (ref <1.0)

## 2018-01-05 MED ORDER — FENTANYL CITRATE (PF) 100 MCG/2ML IJ SOLN
50.0000 ug | Freq: Once | INTRAMUSCULAR | Status: AC
  Administered 2018-01-05: 50 ug via INTRAVENOUS
  Filled 2018-01-05: qty 2

## 2018-01-05 MED ORDER — HYDROCODONE-ACETAMINOPHEN 5-325 MG PO TABS: 1.0000 | ORAL_TABLET | Freq: Four times a day (QID) | ORAL | 0 refills | Status: DC | PRN

## 2018-01-05 MED ORDER — HYDROCODONE-ACETAMINOPHEN 5-325 MG PO TABS
1.0000 | ORAL_TABLET | Freq: Once | ORAL | Status: AC
  Administered 2018-01-05: 1 via ORAL
  Filled 2018-01-05: qty 1

## 2018-01-05 NOTE — Discharge Instructions (Signed)
Please read and follow all provided instructions.  Your diagnoses today include:  1. Palpable purpura ([place])   2. Rash    Your rash is likely being caused by a problem called vasculitis.  This causes 'purpura' which is the medical name for the type of rash that you have.  There are many reasons that this can occur which is why follow-up with your outpatient doctor is very important.    Tests performed today include:  Blood counts and electrolytes -shows normal platelets and blood counts  Vital signs. See below for your results today.   Medications prescribed:   Vicodin (hydrocodone/acetaminophen) - narcotic pain medication  DO NOT drive or perform any activities that require you to be awake and alert because this medicine can make you drowsy. BE VERY CAREFUL not to take multiple medicines containing Tylenol (also called acetaminophen). Doing so can lead to an overdose which can damage your liver and cause liver failure and possibly death.  Take any prescribed medications only as directed.  Home care instructions:  Follow any educational materials contained in this packet.  BE VERY CAREFUL not to take multiple medicines containing Tylenol (also called acetaminophen). Doing so can lead to an overdose which can damage your liver and cause liver failure and possibly death.   Follow-up instructions: Please follow-up with your primary care provider in the next 5 days for further evaluation of your symptoms.   Return instructions:   Please return to the Emergency Department if you experience worsening symptoms.   Return to the emergency department if you develop a fever, trouble breathing, any kind of bleeding or easy bruising.  Please return if you have any other emergent concerns.  Additional Information:  Your vital signs today were: BP (!) 148/64 (BP Location: Right Arm)    Pulse 76    Temp 97.6 F (36.4 C) (Oral)    Resp 18    SpO2 97%  If your blood pressure (BP) was elevated  above 135/85 this visit, please have this repeated by your doctor within one month. --------------

## 2018-01-05 NOTE — ED Triage Notes (Addendum)
Patient c/o burning rash to bilateral legs since yesterday. Rash extends up to the knee. Ambulatory. Recent back surgery. Denies taking blood thinners.

## 2018-01-05 NOTE — ED Provider Notes (Signed)
[facility] DEPT Provider Note   CSN: [id number] Arrival date & time: 01/05/18  1323     History   Chief Complaint Chief Complaint  Patient presents with  . Rash    HPI Cheryl Cooper is a 77 y.o. female.  Patient with history of diabetes, depression, recent back surgery --presents the emergency department with complaint of a burning red rash which she first noticed on her legs yesterday morning.  Rash was initially "splotchy" and became more red over time.  She denies any fevers, URI symptoms, nausea, vomiting, or diarrhea.  No chest pain or abdominal pain.  No urinary symptoms.  She denies blood in her stool, urine, easy bruising or bleeding otherwise.  She was recently on a course of amoxicillin for her throat.  She has been having pain since she was intubated during surgery.  She last took the antibiotic 3 days ago and does not report other reactions to this.  Patient is also on lamotrigine but has been for a long period of time.  No anticoagulation history.  She has never had a rash like this before.  The onset of this condition was acute. The course is constant. Aggravating factors: none. Alleviating factors: none.       Past Medical History:  Diagnosis Date  . Anxiety   . Carpal tunnel syndrome   . Deafness in left ear   . Depression   . Diabetes mellitus    diet controlled  . GERD (gastroesophageal reflux disease)   . Hypercholesteremia   . Hypertension   . Hypothyroid   . Memory loss    reports d/t concussion  . PONV (postoperative nausea and vomiting) yrs ago, none recent  . Post concussion syndrome   . Stroke [facility])    reports they were silent    Patient Active Problem List   Diagnosis Date Noted  . Synovial cyst of lumbar facet joint 12/23/2017  . Osteoarthritis of right hip 04/24/2017  . Primary osteoarthritis of right hip 04/24/2017  . Complete rotator cuff tear 05/09/2016  . Brainstem stroke ([place]) 01/04/2015  .  Dizziness and giddiness 01/04/2015  . Post concussion syndrome 01/04/2015  . Rotator cuff tear, right 11/21/2011    Past Surgical History:  Procedure Laterality Date  . ABDOMINAL HYSTERECTOMY  1986  . BACK SURGERY  2010   lower  . left elobow surgery  2003  . left rotator cuff  2001  . LUMBAR LAMINECTOMY/DECOMPRESSION MICRODISCECTOMY Left 12/23/2017   Procedure: Laminectomy for facet/synovial cyst - left - Lumbar three-Lumbar four;  Surgeon: Earnie Larsson, MD;  Location: [facility];  Service: Neurosurgery;  Laterality: Left;  . r foot surgery  1995  . right hand surgery  2001  . right index finger surgery  2009  . SHOULDER OPEN ROTATOR CUFF REPAIR  11/21/2011   Procedure: ROTATOR CUFF REPAIR SHOULDER OPEN;  Surgeon: Johnn Hai, MD;  [facility];  Service: Orthopedics;  Laterality: Right;  with subacromial decompression  . SHOULDER OPEN ROTATOR CUFF REPAIR Right 05/09/2016   Procedure: Right shoulder mini open revision rotator cuff repair, subacromial decompression;  Surgeon: Susa Day, MD;  Location: [facility];  Service: Orthopedics;  Laterality: Right;  . TOTAL HIP ARTHROPLASTY Right 04/24/2017   Procedure: RIGHT TOTAL HIP ARTHROPLASTY ANTERIOR APPROACH;  Surgeon: Rod Can, MD;  [facility];  Service: Orthopedics;  Laterality: Right;  Needs RNFA     OB History    Gravida  5   Para  3   Term      Preterm      AB  2   Living  3     SAB  2   TAB      Ectopic      Multiple      Live Births               Home Medications    Prior to Admission medications   Medication Sig Start Date End Date Taking? Authorizing Provider  alendronate (FOSAMAX) 70 MG tablet Take 70 mg by mouth every Monday. Take with a full glass of water on an empty stomach.     [provider]  amLODipine (NORVASC) 5 MG tablet Take 5 mg by mouth daily.  04/05/15   [provider]  aspirin 81 MG chewable tablet Chew 1 tablet (81 mg total) by mouth 2 (two) times  daily. Patient taking differently: Chew 81 mg by mouth 2 (two) times a week.  04/25/17   Swinteck, Aaron Edelman, MD  Calcium Carbonate-Vitamin D (CALCIUM-D PO) Take 2 tablets by mouth daily.    [provider]  cyclobenzaprine (FLEXERIL) 10 MG tablet Take 1 tablet (10 mg total) by mouth 3 (three) times daily as needed for muscle spasms. Patient not taking: Reported on 12/30/2017 12/23/17   Earnie Larsson, MD  escitalopram (LEXAPRO) 10 MG tablet Take 10 mg by mouth daily.  09/03/14   [provider]  esomeprazole (NEXIUM) 40 MG capsule Take 40 mg by mouth daily.  09/24/14   [provider]  ezetimibe (ZETIA) 10 MG tablet Take 1 tablet (10 mg total) by mouth daily. 07/21/17   Wendie Agreste, MD  HYDROcodone-acetaminophen (NORCO/VICODIN) 5-325 MG tablet Take 1-2 tablets by mouth every 4 (four) hours as needed for moderate pain ((score 4 to 6)). Patient not taking: Reported on 12/30/2017 12/23/17   Earnie Larsson, MD  hydrOXYzine (ATARAX/VISTARIL) 10 MG tablet Take 1 tablet (10 mg total) by mouth at bedtime as needed for anxiety (and sleep.). 06/17/17   Wendie Agreste, MD  lamoTRIgine (LAMICTAL) 100 MG tablet Take 100 mg by mouth daily.     [provider]  levothyroxine (SYNTHROID, LEVOTHROID) 75 MCG tablet Take 75 mcg by mouth daily with breakfast.    [provider]  loratadine (CLARITIN) 10 MG tablet Take 10 mg by mouth daily as needed for allergies.    [provider]  Multiple Vitamin (MULTIVITAMIN WITH MINERALS) TABS tablet Take 1 tablet by mouth daily.    [provider]  naproxen sodium (ALEVE) 220 MG tablet Take 220-440 mg by mouth daily as needed (pain).    [provider]  nystatin-triamcinolone ointment (MYCOLOG) Apply 1 application topically 2 (two) times daily. Patient not taking: Reported on 12/19/2017 07/31/17   Fontaine, Belinda Block, MD  oxybutynin (DITROPAN-XL) 10 MG 24 hr tablet Take 1 tablet (10 mg total) by mouth daily. 11/14/17    Wendie Agreste, MD  simvastatin (ZOCOR) 40 MG tablet Take 1 tablet (40 mg total) by mouth at bedtime. 07/21/17   Wendie Agreste, MD  SUPER B COMPLEX/C PO Take 1 tablet by mouth daily.    [provider]  traZODone (DESYREL) 50 MG tablet Take 50 mg by mouth at bedtime.    [provider]    Family History Family History  Problem Relation Age of Onset  . Stroke Father   . Stroke Sister   . Stroke Brother   . Diabetes Brother   .  Dementia Brother   . Stroke Sister        TIA    Social History Social History   Tobacco Use  . Smoking status: Never Smoker  . Smokeless tobacco: Never Used  Substance Use Topics  . Alcohol use: Yes    Alcohol/week: 1.0 standard drinks    Types: 1 Glasses of wine per week    Comment: glass wine/day  . Drug use: No     Allergies   Other; Atorvastatin; Chlordiazepoxide-clidinium; and Codeine   Review of Systems Review of Systems  Constitutional: Negative for fever.  HENT: Negative for rhinorrhea and sore throat.   Eyes: Negative for redness.  Respiratory: Negative for cough.   Cardiovascular: Negative for chest pain.  Gastrointestinal: Negative for abdominal pain, diarrhea, nausea and vomiting.  Genitourinary: Negative for dysuria.  Musculoskeletal: Positive for myalgias.  Skin: Positive for rash.  Neurological: Negative for headaches.     Physical Exam Updated Vital Signs BP (!) 148/64 (BP Location: Right Arm)   Pulse 76   Temp 97.6 F (36.4 C) (Oral)   Resp 18   SpO2 97%   Physical Exam  Constitutional: She appears well-developed and well-nourished.  HENT:  Head: Normocephalic and atraumatic.  Mouth/Throat: Oropharynx is clear and moist.  No intraoral lesions noted.  Eyes: Conjunctivae are normal. Right eye exhibits no discharge. Left eye exhibits no discharge.  Conjunctive is normal without injection.  Neck: Normal range of motion. Neck supple.  No meningeal signs.  Cardiovascular: Normal rate,  regular rhythm and normal heart sounds.  Pulses:      Dorsalis pedis pulses are 2+ on the right side, and 2+ on the left side.  Distal pulses intact.  Pulmonary/Chest: Effort normal and breath sounds normal. No respiratory distress. She has no wheezes. She has no rales.  Abdominal: Soft. There is no tenderness.  Musculoskeletal: She exhibits no edema.  Neurological: She is alert.  Skin: Skin is warm and dry. Rash noted.  Patient with palpable purpuric lesions noted to the bilateral lower extremities up to the hips.  They are more coalescence distally and become more sparse proximally.  Minimal tenderness to palpation.  No warmth to palpation.  Rash spares the upper extremities, abdomen and chest.  Psychiatric: She has a normal mood and affect.  Nursing note and vitals reviewed.        ED Treatments / Results  Labs (all labs ordered are listed, but only abnormal results are displayed) Labs Reviewed  CBC WITH DIFFERENTIAL/PLATELET - Abnormal; Notable for the following components:      Result Value   Hemoglobin 10.9 (*)    MCH 25.8 (*)    RDW 17.4 (*)    All other components within normal limits  COMPREHENSIVE METABOLIC PANEL - Abnormal; Notable for the following components:   Glucose, Bld 113 (*)    Calcium 8.5 (*)    Total Protein 6.3 (*)    Albumin 2.9 (*)    All other components within normal limits  CULTURE, BLOOD (ROUTINE X 2)  CULTURE, BLOOD (ROUTINE X 2)  SEDIMENTATION RATE  C-REACTIVE PROTEIN  CRYOGLOBULIN  ANTINUCLEAR ANTIBODIES, IFA  COMPLEMENT, TOTAL  ANCA TITERS  MPO/PR-3 (ANCA) ANTIBODIES    EKG None  Radiology No results found.  Procedures Procedures (including critical care time)  Medications Ordered in ED Medications  fentaNYL (SUBLIMAZE) injection 50 mcg (50 mcg Intravenous Given 01/05/18 1433)     Initial Impression / Assessment and Plan / ED Course  I have reviewed  the triage vital signs and the nursing notes.  Pertinent labs & imaging  results that were available during my care of the patient were reviewed by me and considered in my medical decision making (see chart for details).     Patient seen and examined. Work-up initiated. Discussed patient with Dr. Regenia Skeeter who will see. Pt appears very well. No fever or other signs of meningitis.  Will check lab work.  Vital signs reviewed and are as follows: BP (!) 148/64 (BP Location: Right Arm)   Pulse 76   Temp 97.6 F (36.4 C) (Oral)   Resp 18   SpO2 97%   3:44 PM Platelets are normal. I discussed patient case with Dr. Maryland Pink of [facility]. He agrees that since the patient appears well she does not necessarily require admission, but he recommends checking additional labs.  These were ordered. Dr. Regenia Skeeter has seen patient, agrees with plan.   Pt updated. She is willing to follow-up with her doctor.  She originally called the office today but was told that she could not get an appointment until Thursday --which prompted them to come to the emergency department.  Anticipate that patient could be discharged home once her labs are drawn.  We discussed signs and symptoms which should cause her to return including the development of any bleeding, fevers, shortness of breath.  She verbalizes understanding agrees with plan.  Final Clinical Impressions(s) / ED Diagnoses   Final diagnoses:  Palpable purpura ([place])  Rash   Patient with palpable purpura developing the past 48 hours.  Initial work-up is reassuring.  Patient does not have any thrombocytopenia or other signs of infection.  Normal blood counts.  She is not febrile.  No concerning features for meningitis.  No anticoagulation.  No active bleeding.  Discussion with internal medicine as above.  Patient and husband are aware of signs and symptoms which should cause them to return.  I sent an electronic message via epic to the patient's PCP requesting close outpatient follow-up.  ED Discharge Orders    None        Carlisle Cater, PA-C 01/05/18 [place], MD 01/05/18 1622

## 2018-01-06 LAB — BLOOD CULTURE ID PANEL (REFLEXED)
Acinetobacter baumannii: NOT DETECTED
CANDIDA GLABRATA: NOT DETECTED
CANDIDA TROPICALIS: NOT DETECTED
Candida albicans: NOT DETECTED
Candida krusei: NOT DETECTED
Candida parapsilosis: NOT DETECTED
ENTEROBACTER CLOACAE COMPLEX: NOT DETECTED
ESCHERICHIA COLI: NOT DETECTED
Enterobacteriaceae species: NOT DETECTED
Enterococcus species: NOT DETECTED
Haemophilus influenzae: NOT DETECTED
KLEBSIELLA PNEUMONIAE: NOT DETECTED
Klebsiella oxytoca: NOT DETECTED
Listeria monocytogenes: NOT DETECTED
NEISSERIA MENINGITIDIS: NOT DETECTED
PROTEUS SPECIES: NOT DETECTED
Pseudomonas aeruginosa: NOT DETECTED
STAPHYLOCOCCUS SPECIES: NOT DETECTED
STREPTOCOCCUS AGALACTIAE: NOT DETECTED
STREPTOCOCCUS PNEUMONIAE: NOT DETECTED
Serratia marcescens: NOT DETECTED
Staphylococcus aureus (BCID): NOT DETECTED
Streptococcus pyogenes: NOT DETECTED
Streptococcus species: DETECTED — AB

## 2018-01-07 LAB — HEPATITIS PANEL, ACUTE
Hep A IgM: NEGATIVE
Hep B C IgM: NEGATIVE
Hepatitis B Surface Ag: NEGATIVE

## 2018-01-08 LAB — CULTURE, BLOOD (ROUTINE X 2): SPECIAL REQUESTS: ADEQUATE

## 2018-01-09 ENCOUNTER — Telehealth: Payer: Self-pay | Admitting: Emergency Medicine

## 2018-01-09 LAB — ANCA TITERS: Atypical P-ANCA titer: <1:20 {titer}

## 2018-01-09 LAB — ANTINUCLEAR ANTIBODIES, IFA: ANTINUCLEAR ANTIBODIES, IFA: NEGATIVE

## 2018-01-09 LAB — COMPLEMENT, TOTAL: Compl, Total (CH50): >60 U/mL

## 2018-01-09 LAB — CRYOGLOBULIN

## 2018-01-09 LAB — MPO/PR-3 (ANCA) ANTIBODIES: ANCA Proteinase 3: 3.5 U/mL (ref 0.0–3.5)

## 2018-01-09 NOTE — Telephone Encounter (Signed)
Post ED Visit - Positive Culture Follow-up  Culture report reviewed by antimicrobial stewardship pharmacist:  []  Elenor Quinones, Pharm.D. []  Heide Guile, Pharm.D., BCPS AQ-ID []  Parks Neptune, Pharm.D., BCPS []  Alycia Rossetti, Pharm.D., BCPS []  [place], Pharm.D., BCPS, AAHIVP []  Legrand Como, Pharm.D., BCPS, AAHIVP [x]  Salome Arnt, PharmD, BCPS []  Johnnette Gourd, PharmD, BCPS []  Hughes Better, PharmD, BCPS []  Leeroy Cha, PharmD  Positive Blood culture Likely contaminant,  no further patient follow-up is required at this time.  Larene Beach Jamel Dunton 01/09/2018, 2:58 PM

## 2018-01-10 LAB — CULTURE, BLOOD (ROUTINE X 2)
CULTURE: NO GROWTH
Special Requests: ADEQUATE

## 2018-01-11 ENCOUNTER — Emergency Department (HOSPITAL_COMMUNITY): Payer: Medicare Other

## 2018-01-11 ENCOUNTER — Encounter (HOSPITAL_COMMUNITY): Payer: Self-pay | Admitting: Emergency Medicine

## 2018-01-11 ENCOUNTER — Observation Stay (HOSPITAL_COMMUNITY)
Admission: EM | Admit: 2018-01-11 | Discharge: 2018-01-13 | Disposition: A | Discharge status: Home / Self Care | Payer: Medicare Other | Attending: Internal Medicine | Admitting: Internal Medicine

## 2018-01-11 ENCOUNTER — Other Ambulatory Visit: Payer: Self-pay

## 2018-01-11 DIAGNOSIS — E869 Volume depletion, unspecified: Secondary | ICD-10-CM | POA: Diagnosis not present

## 2018-01-11 DIAGNOSIS — B370 Candidal stomatitis: Secondary | ICD-10-CM | POA: Diagnosis not present

## 2018-01-11 DIAGNOSIS — L959 Vasculitis limited to the skin, unspecified: Secondary | ICD-10-CM

## 2018-01-11 DIAGNOSIS — E039 Hypothyroidism, unspecified: Secondary | ICD-10-CM | POA: Insufficient documentation

## 2018-01-11 DIAGNOSIS — F329 Major depressive disorder, single episode, unspecified: Secondary | ICD-10-CM | POA: Insufficient documentation

## 2018-01-11 DIAGNOSIS — F419 Anxiety disorder, unspecified: Secondary | ICD-10-CM | POA: Diagnosis not present

## 2018-01-11 DIAGNOSIS — R112 Nausea with vomiting, unspecified: Secondary | ICD-10-CM | POA: Diagnosis present

## 2018-01-11 DIAGNOSIS — E7800 Pure hypercholesterolemia, unspecified: Secondary | ICD-10-CM | POA: Insufficient documentation

## 2018-01-11 DIAGNOSIS — Z885 Allergy status to narcotic agent status: Secondary | ICD-10-CM | POA: Insufficient documentation

## 2018-01-11 DIAGNOSIS — E119 Type 2 diabetes mellitus without complications: Secondary | ICD-10-CM | POA: Diagnosis not present

## 2018-01-11 DIAGNOSIS — Z79899 Other long term (current) drug therapy: Secondary | ICD-10-CM | POA: Insufficient documentation

## 2018-01-11 DIAGNOSIS — K2980 Duodenitis without bleeding: Secondary | ICD-10-CM

## 2018-01-11 DIAGNOSIS — K210 Gastro-esophageal reflux disease with esophagitis: Secondary | ICD-10-CM | POA: Diagnosis not present

## 2018-01-11 DIAGNOSIS — K5750 Diverticulosis of both small and large intestine without perforation or abscess without bleeding: Secondary | ICD-10-CM | POA: Insufficient documentation

## 2018-01-11 DIAGNOSIS — Z96641 Presence of right artificial hip joint: Secondary | ICD-10-CM | POA: Insufficient documentation

## 2018-01-11 DIAGNOSIS — M1611 Unilateral primary osteoarthritis, right hip: Secondary | ICD-10-CM | POA: Diagnosis not present

## 2018-01-11 DIAGNOSIS — Z8673 Personal history of transient ischemic attack (TIA), and cerebral infarction without residual deficits: Secondary | ICD-10-CM | POA: Insufficient documentation

## 2018-01-11 DIAGNOSIS — M5137 Other intervertebral disc degeneration, lumbosacral region: Secondary | ICD-10-CM | POA: Diagnosis not present

## 2018-01-11 DIAGNOSIS — K449 Diaphragmatic hernia without obstruction or gangrene: Secondary | ICD-10-CM | POA: Insufficient documentation

## 2018-01-11 DIAGNOSIS — K269 Duodenal ulcer, unspecified as acute or chronic, without hemorrhage or perforation: Secondary | ICD-10-CM | POA: Insufficient documentation

## 2018-01-11 DIAGNOSIS — K2270 Barrett's esophagus without dysplasia: Principal | ICD-10-CM | POA: Insufficient documentation

## 2018-01-11 DIAGNOSIS — I700 Atherosclerosis of aorta: Secondary | ICD-10-CM | POA: Diagnosis not present

## 2018-01-11 DIAGNOSIS — K2950 Unspecified chronic gastritis without bleeding: Secondary | ICD-10-CM | POA: Diagnosis not present

## 2018-01-11 DIAGNOSIS — M5136 Other intervertebral disc degeneration, lumbar region: Secondary | ICD-10-CM | POA: Diagnosis not present

## 2018-01-11 DIAGNOSIS — I10 Essential (primary) hypertension: Secondary | ICD-10-CM | POA: Insufficient documentation

## 2018-01-11 DIAGNOSIS — Z794 Long term (current) use of insulin: Secondary | ICD-10-CM | POA: Insufficient documentation

## 2018-01-11 DIAGNOSIS — Z7982 Long term (current) use of aspirin: Secondary | ICD-10-CM | POA: Insufficient documentation

## 2018-01-11 LAB — URINALYSIS, ROUTINE W REFLEX MICROSCOPIC
BACTERIA UA: NONE SEEN
BILIRUBIN URINE: NEGATIVE
GLUCOSE, UA: NEGATIVE mg/dL
HGB URINE DIPSTICK: NEGATIVE
KETONES UR: 20 mg/dL — AB
NITRITE: NEGATIVE
PH: 7 (ref 5.0–8.0)
Protein, ur: NEGATIVE mg/dL
Specific Gravity, Urine: >1.046 — ABNORMAL HIGH (ref 1.005–1.030)

## 2018-01-11 LAB — CBC
HCT: 43.6 % (ref 36.0–46.0)
HEMOGLOBIN: 13.6 g/dL (ref 12.0–15.0)
MCH: 25.9 pg — AB (ref 26.0–34.0)
MCHC: 31.2 g/dL (ref 30.0–36.0)
MCV: 83 fL (ref 80.0–100.0)
NRBC: 0 % (ref 0.0–0.2)
Platelets: 533 10*3/uL — ABNORMAL HIGH (ref 150–400)
RBC: 5.25 MIL/uL — AB (ref 3.87–5.11)
RDW: 17.1 % — ABNORMAL HIGH (ref 11.5–15.5)
WBC: 12.6 10*3/uL — ABNORMAL HIGH (ref 4.0–10.5)

## 2018-01-11 LAB — TSH: TSH: 4.032 u[IU]/mL (ref 0.350–4.500)

## 2018-01-11 LAB — COMPREHENSIVE METABOLIC PANEL
ALT: 15 U/L (ref 0–44)
ANION GAP: 11 (ref 5–15)
AST: 18 U/L (ref 15–41)
Albumin: 3 g/dL — ABNORMAL LOW (ref 3.5–5.0)
Alkaline Phosphatase: 38 U/L (ref 38–126)
BUN: 17 mg/dL (ref 8–23)
CO2: 23 mmol/L (ref 22–32)
Calcium: 8.7 mg/dL — ABNORMAL LOW (ref 8.9–10.3)
Chloride: 102 mmol/L (ref 98–111)
Creatinine, Ser: 0.69 mg/dL (ref 0.44–1.00)
GFR calc non Af Amer: >60 mL/min (ref >60)
Glucose, Bld: 152 mg/dL — ABNORMAL HIGH (ref 70–99)
POTASSIUM: 3.9 mmol/L (ref 3.5–5.1)
SODIUM: 136 mmol/L (ref 135–145)
TOTAL PROTEIN: 6.7 g/dL (ref 6.5–8.1)
Total Bilirubin: 0.3 mg/dL (ref 0.3–1.2)

## 2018-01-11 LAB — LIPASE, BLOOD: Lipase: 20 U/L (ref 11–51)

## 2018-01-11 LAB — MAGNESIUM: Magnesium: 2 mg/dL (ref 1.7–2.4)

## 2018-01-11 LAB — GLUCOSE, CAPILLARY: Glucose-Capillary: 171 mg/dL — ABNORMAL HIGH (ref 70–99)

## 2018-01-11 LAB — PHOSPHORUS: Phosphorus: 1.8 mg/dL — ABNORMAL LOW (ref 2.5–4.6)

## 2018-01-11 LAB — C-REACTIVE PROTEIN: CRP: 0.8 mg/dL (ref <1.0)

## 2018-01-11 LAB — I-STAT CG4 LACTIC ACID, ED: Lactic Acid, Venous: 1.38 mmol/L (ref 0.5–1.9)

## 2018-01-11 MED ORDER — MORPHINE SULFATE (PF) 4 MG/ML IV SOLN
6.0000 mg | Freq: Once | INTRAVENOUS | Status: AC
  Administered 2018-01-11: 6 mg via INTRAVENOUS
  Filled 2018-01-11: qty 2

## 2018-01-11 MED ORDER — IOPAMIDOL (ISOVUE-300) INJECTION 61%
100.0000 mL | Freq: Once | INTRAVENOUS | Status: AC | PRN
  Administered 2018-01-11: 100 mL via INTRAVENOUS

## 2018-01-11 MED ORDER — ONDANSETRON HCL 4 MG/2ML IJ SOLN
4.0000 mg | Freq: Once | INTRAMUSCULAR | Status: AC
  Administered 2018-01-11: 4 mg via INTRAVENOUS
  Filled 2018-01-11: qty 2

## 2018-01-11 MED ORDER — METHYLPREDNISOLONE SODIUM SUCC 125 MG IJ SOLR
125.0000 mg | Freq: Once | INTRAMUSCULAR | Status: AC
  Administered 2018-01-11: 125 mg via INTRAVENOUS
  Filled 2018-01-11: qty 2

## 2018-01-11 MED ORDER — ESCITALOPRAM OXALATE 10 MG PO TABS
10.0000 mg | ORAL_TABLET | Freq: Every day | ORAL | Status: DC
  Administered 2018-01-12: 10 mg via ORAL
  Filled 2018-01-11: qty 1

## 2018-01-11 MED ORDER — ALENDRONATE SODIUM 70 MG PO TABS: 70.0000 mg | ORAL_TABLET | ORAL | Status: DC

## 2018-01-11 MED ORDER — AMPICILLIN 500 MG PO CAPS
500.0000 mg | ORAL_CAPSULE | Freq: Two times a day (BID) | ORAL | Status: DC
  Administered 2018-01-11 – 2018-01-12 (×3): 500 mg via ORAL
  Filled 2018-01-11 (×4): qty 1

## 2018-01-11 MED ORDER — AMLODIPINE BESYLATE 5 MG PO TABS
5.0000 mg | ORAL_TABLET | Freq: Every day | ORAL | Status: DC
  Administered 2018-01-11 – 2018-01-12 (×2): 5 mg via ORAL
  Filled 2018-01-11 (×2): qty 1

## 2018-01-11 MED ORDER — PANTOPRAZOLE SODIUM 40 MG IV SOLR
40.0000 mg | Freq: Two times a day (BID) | INTRAVENOUS | Status: DC
  Administered 2018-01-11 – 2018-01-12 (×3): 40 mg via INTRAVENOUS
  Filled 2018-01-11 (×3): qty 40

## 2018-01-11 MED ORDER — TRAZODONE HCL 50 MG PO TABS
50.0000 mg | ORAL_TABLET | Freq: Every day | ORAL | Status: DC
  Administered 2018-01-11: 50 mg via ORAL
  Administered 2018-01-12: 25 mg via ORAL
  Filled 2018-01-11 (×2): qty 1

## 2018-01-11 MED ORDER — EZETIMIBE 10 MG PO TABS
10.0000 mg | ORAL_TABLET | Freq: Every day | ORAL | Status: DC
  Administered 2018-01-11 – 2018-01-12 (×2): 10 mg via ORAL
  Filled 2018-01-11 (×2): qty 1

## 2018-01-11 MED ORDER — SODIUM CHLORIDE (PF) 0.9 % IJ SOLN
INTRAMUSCULAR | Status: AC
  Filled 2018-01-11: qty 50

## 2018-01-11 MED ORDER — INSULIN ASPART 100 UNIT/ML ~~LOC~~ SOLN
0.0000 [IU] | Freq: Three times a day (TID) | SUBCUTANEOUS | Status: DC
  Administered 2018-01-12: 1 [IU] via SUBCUTANEOUS

## 2018-01-11 MED ORDER — ASPIRIN 81 MG PO CHEW: 81.0000 mg | CHEWABLE_TABLET | ORAL | Status: DC

## 2018-01-11 MED ORDER — SODIUM CHLORIDE 0.9 % IV SOLN
INTRAVENOUS | Status: DC
  Administered 2018-01-11 – 2018-01-13 (×5): via INTRAVENOUS

## 2018-01-11 MED ORDER — SODIUM CHLORIDE 0.9 % IV BOLUS
2000.0000 mL | Freq: Once | INTRAVENOUS | Status: AC
  Administered 2018-01-11: 2000 mL via INTRAVENOUS

## 2018-01-11 MED ORDER — CALCIUM CARBONATE-VITAMIN D 500-200 MG-UNIT PO TABS
ORAL_TABLET | Freq: Every day | ORAL | Status: DC
  Administered 2018-01-12: 1 via ORAL
  Filled 2018-01-11: qty 1

## 2018-01-11 MED ORDER — IOPAMIDOL (ISOVUE-300) INJECTION 61%
INTRAVENOUS | Status: AC
  Filled 2018-01-11: qty 100

## 2018-01-11 MED ORDER — ENOXAPARIN SODIUM 40 MG/0.4ML ~~LOC~~ SOLN
40.0000 mg | SUBCUTANEOUS | Status: DC
  Administered 2018-01-11 – 2018-01-12 (×2): 40 mg via SUBCUTANEOUS
  Filled 2018-01-11 (×2): qty 0.4

## 2018-01-11 MED ORDER — ONDANSETRON HCL 4 MG/2ML IJ SOLN: 4.0000 mg | Freq: Four times a day (QID) | INTRAMUSCULAR | Status: DC | PRN

## 2018-01-11 MED ORDER — LEVOTHYROXINE SODIUM 75 MCG PO TABS
75.0000 ug | ORAL_TABLET | Freq: Every day | ORAL | Status: DC
  Administered 2018-01-12 – 2018-01-13 (×2): 75 ug via ORAL
  Filled 2018-01-11 (×2): qty 1

## 2018-01-11 MED ORDER — TRIAMCINOLONE ACETONIDE 0.1 % EX CREA
1.0000 "application " | TOPICAL_CREAM | Freq: Two times a day (BID) | CUTANEOUS | Status: DC
  Administered 2018-01-11 – 2018-01-12 (×3): 1 via TOPICAL
  Filled 2018-01-11 (×2): qty 15

## 2018-01-11 MED ORDER — OXYBUTYNIN CHLORIDE ER 5 MG PO TB24
10.0000 mg | ORAL_TABLET | Freq: Every day | ORAL | Status: DC
  Administered 2018-01-12: 10 mg via ORAL
  Filled 2018-01-11: qty 2

## 2018-01-11 MED ORDER — SIMVASTATIN 40 MG PO TABS
40.0000 mg | ORAL_TABLET | Freq: Every day | ORAL | Status: DC
  Administered 2018-01-11 – 2018-01-12 (×2): 40 mg via ORAL
  Filled 2018-01-11 (×2): qty 1

## 2018-01-11 MED ORDER — PANTOPRAZOLE SODIUM 40 MG IV SOLR
40.0000 mg | Freq: Once | INTRAVENOUS | Status: AC
  Administered 2018-01-11: 40 mg via INTRAVENOUS
  Filled 2018-01-11: qty 40

## 2018-01-11 MED ORDER — LAMOTRIGINE 100 MG PO TABS
100.0000 mg | ORAL_TABLET | Freq: Every day | ORAL | Status: DC
  Administered 2018-01-11 – 2018-01-12 (×2): 100 mg via ORAL
  Filled 2018-01-11 (×2): qty 1

## 2018-01-11 NOTE — ED Triage Notes (Signed)
Patient c/o vomiting since last night. Denies abdominal pain and diarrhea. Dx with purpura to bilateral legs last week. Denies changes in rash.

## 2018-01-11 NOTE — H&P (Signed)
History and Physical  Cheryl Cooper VHQ:[id number] DOB: Feb 09, 1941 DOA: 01/11/2018  Referring physician: ER provider PCP: Wendie Agreste, MD  Outpatient Specialists: Dermatology for new rash, query vasculitic rash. Patient coming from: Home  Chief Complaint: Nausea and vomiting  HPI: Patient is a 77 year old female, with past medical history significant for newly diagnosed rash of the lower extremities (about 2-week ago), for which patient is said to be followed up by dermatologist, and possibility of vasculitic rash is being considered.  Patient also carries a prior diagnosis of CVA, memory loss, hypothyroidism, hypertension, hyperlipidemia, GERD, diabetes mellitus that is diet controlled, depression, deafness of the left ear, anxiety and carpal tunnel syndrome.  Patient developed significant nausea and vomiting about 2 days ago.  No associated fever or chills, no abdominal pain, no diarrhea no joint pains.  Last vomiting was early this morning.  CBC reveals mild leukocytosis, UA revealed moderate leukocyte and specific gravity that is greater than 1.046.  No headache, no neck pain, no shortness of breath, no chest pain and no urinary symptoms.  Patient be admitted for further assessment and management.  ED Course: Patient was volume resuscitated.  GI team has been consulted.  ER physician was worried about possible vasculitis, and has administered 1 dose of IV steroids.  Pertinent labs: Chemistry revealed sodium of 136, potassium of 3.9, chloride 102, CO2 of 23, BUN of 17, creatinine of 0.79 with blood sugar 152.  Lipase is 20.  Lactic acid is 1.38.  CBC reveals WBC of 12.6, hemoglobin of 13.6, hematocrit of 43.6, MCV of 83 with platelet count of 533.  Urinalysis reveals 20 of ketones, moderate leukocyte esterase, specific gravity that is greater than 1.046 and no bacteria was seen.  CT scan of the abdomen and pelvis with contrast revealed findings compatible with acute duodenitis of the mid and  distal portions of the duodenum, occultly excluding ulcer disease, diverticulosis.  Imaging: independently reviewed.   Review of Systems:  Negative for fever, visual changes, new muscle aches, chest pain, SOB, dysuria, bleeding, abdominal pain.  Past Medical History:  Diagnosis Date  . Anxiety   . Carpal tunnel syndrome   . Deafness in left ear   . Depression   . Diabetes mellitus    diet controlled  . GERD (gastroesophageal reflux disease)   . Hypercholesteremia   . Hypertension   . Hypothyroid   . Memory loss    reports d/t concussion  . PONV (postoperative nausea and vomiting) yrs ago, none recent  . Post concussion syndrome   . Stroke [facility])    reports they were silent    Past Surgical History:  Procedure Laterality Date  . ABDOMINAL HYSTERECTOMY  1986  . BACK SURGERY  2010   lower  . left elobow surgery  2003  . left rotator cuff  2001  . LUMBAR LAMINECTOMY/DECOMPRESSION MICRODISCECTOMY Left 12/23/2017   Procedure: Laminectomy for facet/synovial cyst - left - Lumbar three-Lumbar four;  Surgeon: Earnie Larsson, MD;  Location: [place];  Service: Neurosurgery;  Laterality: Left;  . r foot surgery  1995  . right hand surgery  2001  . right index finger surgery  2009  . SHOULDER OPEN ROTATOR CUFF REPAIR  11/21/2011   Procedure: ROTATOR CUFF REPAIR SHOULDER OPEN;  Surgeon: Johnn Hai, MD;  Location: [facility];  Service: Orthopedics;  Laterality: Right;  with subacromial decompression  . SHOULDER OPEN ROTATOR CUFF REPAIR Right 05/09/2016   Procedure: Right shoulder mini open revision rotator cuff repair, subacromial  decompression;  Surgeon: Susa Day, MD;  Location: [facility];  Service: Orthopedics;  Laterality: Right;  . TOTAL HIP ARTHROPLASTY Right 04/24/2017   Procedure: RIGHT TOTAL HIP ARTHROPLASTY ANTERIOR APPROACH;  Surgeon: Rod Can, MD;  Location: [facility];  Service: Orthopedics;  Laterality: Right;  Needs RNFA     reports that she has never smoked. She has never used  smokeless tobacco. She reports that she drinks about 1.0 standard drinks of alcohol per week. She reports that she does not use drugs.  Allergies  Allergen Reactions  . Other Other (See Comments)    Tomato sauce, garlic, onion - severe acid reflux   . Flexeril [Cyclobenzaprine] Other (See Comments)    Per spouse "she felt like a zombie"  . Atorvastatin Other (See Comments)    Unbalanced  . Chlordiazepoxide-Clidinium Other (See Comments)    Dizziness (intolerance)  . Codeine Nausea Only    Family History  Problem Relation Age of Onset  . Stroke Father   . Stroke Sister   . Stroke Brother   . Diabetes Brother   . Dementia Brother   . Stroke Sister        TIA     Prior to Admission medications   Medication Sig Start Date End Date Taking? Authorizing Provider  alendronate (FOSAMAX) 70 MG tablet Take 70 mg by mouth every Monday. Take with a full glass of water on an empty stomach.    Yes [provider]  amLODipine (NORVASC) 5 MG tablet Take 5 mg by mouth daily.  04/05/15  Yes [provider]  ampicillin (PRINCIPEN) 500 MG capsule Take 500 mg by mouth 2 (two) times daily. 01/07/18  Yes [provider]  aspirin 81 MG chewable tablet Chew 1 tablet (81 mg total) by mouth 2 (two) times daily. Patient taking differently: Chew 81 mg by mouth 2 (two) times a week.  04/25/17  Yes Swinteck, Aaron Edelman, MD  Calcium Carbonate-Vitamin D (CALCIUM-D PO) Take 2 tablets by mouth daily.   Yes [provider]  escitalopram (LEXAPRO) 10 MG tablet Take 10 mg by mouth daily.  09/03/14  Yes [provider]  esomeprazole (NEXIUM) 40 MG capsule Take 40 mg by mouth daily.  09/24/14  Yes [provider]  ezetimibe (ZETIA) 10 MG tablet Take 1 tablet (10 mg total) by mouth daily. 07/21/17  Yes Wendie Agreste, MD  hydrOXYzine (ATARAX/VISTARIL) 10 MG tablet Take 1 tablet (10 mg total) by mouth at bedtime as needed for anxiety (and sleep.). 06/17/17  Yes Wendie Agreste, MD  lamoTRIgine (LAMICTAL) 100 MG tablet Take 100 mg by mouth daily.    Yes [provider]  levothyroxine (SYNTHROID, LEVOTHROID) 75 MCG tablet Take 75 mcg by mouth daily with breakfast.   Yes [provider]  naproxen sodium (ALEVE) 220 MG tablet Take 220-440 mg by mouth daily as needed (for pain or headache).    Yes [provider]  oxybutynin (DITROPAN-XL) 10 MG 24 hr tablet Take 1 tablet (10 mg total) by mouth daily. 11/14/17  Yes Wendie Agreste, MD  simvastatin (ZOCOR) 40 MG tablet Take 1 tablet (40 mg total) by mouth at bedtime. 07/21/17  Yes Wendie Agreste, MD  SUPER B COMPLEX/C PO Take 1 tablet by mouth daily.   Yes [provider]  traZODone (DESYREL) 50 MG tablet Take 50 mg by mouth at bedtime.   Yes [provider]  triamcinolone cream (KENALOG) 0.1 % Apply 1 application topically 2 (two) times  daily.   Yes [provider]  HYDROcodone-acetaminophen (NORCO/VICODIN) 5-325 MG tablet Take 1 tablet by mouth every 6 (six) hours as needed. Patient not taking: Reported on 01/11/2018 01/05/18   Carlisle Cater, PA-C    Physical Exam: Vitals:   01/11/18 1153 01/11/18 1330 01/11/18 1530  BP: (!) 159/88 (!) 158/78 (!) 170/85  Pulse: 79 77 72  Resp: 20 20 16   Temp: 97.8 F (36.6 C)    TempSrc: Oral    SpO2: 97% 98% 97%   Constitutional:  . Appears calm and comfortable Eyes:  . No pallor. No jaundice.  ENMT:  . external ears, nose appear normal. Dry buccal mucosa. Neck:  . Neck is supple. No JVD Respiratory:  . CTA bilaterally, no w/r/r.  . Respiratory effort normal. No retractions or accessory muscle use Cardiovascular:  . S1S2 . No LE extremity edema   Abdomen:  . Abdomen is obese, soft and non tender. Organs are difficult to assess. Neurologic:  . Awake and alert. . Moves all limbs.  Skin: Rash both lower legs and lower abdomen.  Wt Readings from Last 3 Encounters:  12/30/17 69.9 kg  09/24/17 70.9 kg    09/18/17 70.9 kg    I have personally reviewed following labs and imaging studies  Labs on Admission:  CBC: Recent Labs  Lab 01/05/18 1431 01/11/18 1202  WBC 5.9 12.6*  NEUTROABS 3.3  --   HGB 10.9* 13.6  HCT 36.3 43.6  MCV 85.8 83.0  PLT 370 242*   Basic Metabolic Panel: Recent Labs  Lab 01/05/18 1431 01/11/18 1202  NA 138 136  K 3.7 3.9  CL 104 102  CO2 27 23  GLUCOSE 113* 152*  BUN 8 17  CREATININE 0.86 0.69  CALCIUM 8.5* 8.7*   Liver Function Tests: Recent Labs  Lab 01/05/18 1431 01/11/18 1202  AST 32 18  ALT 24 15  ALKPHOS 46 38  BILITOT 0.6 0.3  PROT 6.3* 6.7  ALBUMIN 2.9* 3.0*   Recent Labs  Lab 01/11/18 1202  LIPASE 20   No results for input(s): AMMONIA in the last 168 hours. Coagulation Profile: No results for input(s): INR, PROTIME in the last 168 hours. Cardiac Enzymes: No results for input(s): CKTOTAL, CKMB, CKMBINDEX, TROPONINI in the last 168 hours. BNP (last 3 results) No results for input(s): PROBNP in the last 8760 hours. HbA1C: No results for input(s): HGBA1C in the last 72 hours. CBG: No results for input(s): GLUCAP in the last 168 hours. Lipid Profile: No results for input(s): CHOL, HDL, LDLCALC, TRIG, CHOLHDL, LDLDIRECT in the last 72 hours. Thyroid Function Tests: No results for input(s): TSH, T4TOTAL, FREET4, T3FREE, THYROIDAB in the last 72 hours. Anemia Panel: No results for input(s): VITAMINB12, FOLATE, FERRITIN, TIBC, IRON, RETICCTPCT in the last 72 hours. Urine analysis:    Component Value Date/Time   COLORURINE YELLOW 01/11/2018 [place] 01/11/2018 1422   LABSPEC >1.046 (H) 01/11/2018 1422   PHURINE 7.0 01/11/2018 1422   GLUCOSEU NEGATIVE 01/11/2018 1422   HGBUR NEGATIVE 01/11/2018 1422   BILIRUBINUR NEGATIVE 01/11/2018 1422   KETONESUR 20 (A) 01/11/2018 1422   PROTEINUR NEGATIVE 01/11/2018 1422   UROBILINOGEN 0.2 12/12/2008 1020   NITRITE NEGATIVE 01/11/2018 1422   LEUKOCYTESUR MODERATE  (A) 01/11/2018 1422   Sepsis Labs: @LABRCNTIP (procalcitonin:4,lacticidven:4) ) Recent Results (from the past 240 hour(s))  Blood culture (routine x 2)     Status: None   Collection Time: 01/05/18  3:56 PM  Result Value Ref Range  Status   Specimen Description   Final    RIGHT ANTECUBITAL Performed at [facility] [address] [place], [place] [address]    Special Requests   Final    BOTTLES DRAWN AEROBIC AND ANAEROBIC Blood Culture adequate volume Performed at [facility] [address] [place], [place] [address]    Culture   Final    NO GROWTH 5 DAYS Performed at [facility], [place] [address] [place], [place] [address]    Report Status 01/10/2018 FINAL  Final  Blood culture (routine x 2)     Status: Abnormal   Collection Time: 01/05/18  3:56 PM  Result Value Ref Range Status   Specimen Description   Final    LEFT ANTECUBITAL Performed at [facility] [address] [place], [place] [address]    Special Requests   Final    BOTTLES DRAWN AEROBIC AND ANAEROBIC Blood Culture adequate volume Performed at [facility] [address] [place], [place] [address]    Culture  Setup Time   Final    GRAM POSITIVE COCCI IN CHAINS ANAEROBIC BOTTLE ONLY Organism ID to follow CRITICAL RESULT CALLED TO, READ BACK BY AND VERIFIED WITH: Evalyn Casco  RN 14:45 01/06/18 ([email or web address]) Performed at [facility], [place] [address], [place], [place] [address]    Culture VIRIDANS STREPTOCOCCUS (A)  Final   Report Status 01/08/2018 FINAL  Final  Blood Culture ID Panel (Reflexed)     Status: Abnormal   Collection Time: 01/05/18  3:56 PM  Result Value Ref Range Status   Enterococcus species NOT DETECTED NOT DETECTED Final   Listeria monocytogenes NOT DETECTED NOT DETECTED Final   Staphylococcus species NOT DETECTED NOT DETECTED Final   Staphylococcus aureus (BCID) NOT DETECTED NOT DETECTED Final    Streptococcus species DETECTED (A) NOT DETECTED Final    Comment: Not Enterococcus species, Streptococcus agalactiae, Streptococcus pyogenes, or Streptococcus pneumoniae. CRITICAL RESULT CALLED TO, READ BACK BY AND VERIFIED WITH: Evalyn Casco RN 14:45 01/06/18 ([email or web address])    Streptococcus agalactiae NOT DETECTED NOT DETECTED Final   Streptococcus pneumoniae NOT DETECTED NOT DETECTED Final   Streptococcus pyogenes NOT DETECTED NOT DETECTED Final   Acinetobacter baumannii NOT DETECTED NOT DETECTED Final   Enterobacteriaceae species NOT DETECTED NOT DETECTED Final   Enterobacter cloacae complex NOT DETECTED NOT DETECTED Final   Escherichia coli NOT DETECTED NOT DETECTED Final   Klebsiella oxytoca NOT DETECTED NOT DETECTED Final   Klebsiella pneumoniae NOT DETECTED NOT DETECTED Final   Proteus species NOT DETECTED NOT DETECTED Final   Serratia marcescens NOT DETECTED NOT DETECTED Final   Haemophilus influenzae NOT DETECTED NOT DETECTED Final   Neisseria meningitidis NOT DETECTED NOT DETECTED Final   Pseudomonas aeruginosa NOT DETECTED NOT DETECTED Final   Candida albicans NOT DETECTED NOT DETECTED Final   Candida glabrata NOT DETECTED NOT DETECTED Final   Candida krusei NOT DETECTED NOT DETECTED Final   Candida parapsilosis NOT DETECTED NOT DETECTED Final   Candida tropicalis NOT DETECTED NOT DETECTED Final    Comment: Performed at [facility], [place] [address], [place], [place] [address]      Radiological Exams on Admission: Ct Abdomen Pelvis W Contrast  Result Date: 01/11/2018 CLINICAL DATA:  Acute abdominal pain, nausea vomiting. Recent lumbar surgery 12/23/2017 EXAM: CT ABDOMEN AND PELVIS WITH CONTRAST TECHNIQUE: Multidetector CT imaging of the abdomen and pelvis was performed using the standard protocol following bolus administration of  intravenous contrast. CONTRAST:  136mL ISOVUE-300 IOPAMIDOL (ISOVUE-300) INJECTION 61% COMPARISON:  01/15/2002 FINDINGS: Lower chest: Minor basilar  atelectasis, worse on the right. Normal heart size. No pericardial or pleural effusion. Moderate sized hiatal hernia. Hepatobiliary: Slight liver hypoattenuation suggesting mild fatty infiltration. Moderate gallbladder distention. Minimal biliary prominence without obstruction. No focal hepatic abnormality. Hepatic and portal veins are patent. Pancreas: Mild atrophy without ductal dilatation. Spleen: Normal in size without focal abnormality. Adrenals/Urinary Tract: Adrenal glands are unremarkable. Kidneys are normal, without renal calculi, focal lesion, or hydronephrosis. Bladder is unremarkable. Stomach/Bowel: Hiatal hernia again noted. Mid and distal portions of the duodenum demonstrate wall thickening, mucosal enhancement and surrounding retroperitoneal strandy edema compatible with acute duodenitis. No evidence of perforation, pneumatosis, fluid collection, or abscess. No associated obstruction pattern. Scattered colonic diverticulosis. No other acute inflammatory process. Appendix not visualized. Vascular/Lymphatic: Aortic atherosclerosis noted. Negative for aneurysm or occlusive disease. No retroperitoneal hemorrhage or hematoma. Mesenteric and renal vasculature have atherosclerotic origins but appear patent. No veno-occlusive process. No adenopathy. Reproductive: Status post hysterectomy. No adnexal masses. Other: No abdominal wall hernia or abnormality. No abdominopelvic ascites. Musculoskeletal: Degenerative changes noted spine. Postop changes from recent lumbar surgery. Small posterior paraspinous and subcutaneous ill-defined fluid collection measuring 3.8 x 1.6 cm, suspect postop hematoma, seroma, less likely abscess. This is at the L4 level. Remote right hip arthroplasty creating artifact across the pelvis. No acute osseous finding. IMPRESSION: CT findings compatible with acute duodenitis of the mid and distal portions of the duodenum as above, usually infectious/inflammatory, unlikely to be ischemic as  the mesenteric vasculature appear patent. Difficult to exclude underlying ulcer disease. No associated obstruction, perforation, or abscess. Small hiatal hernia Aortic atherosclerosis without aneurysm Diverticulosis Remote hysterectomy Posterior paraspinous and subcutaneous L4 level fluid collection, suspect postoperative as above. Electronically Signed   By: Jerilynn Mages.  Shick M.D.   On: 01/11/2018 14:42    Active Problems:   * No active hospital problems. *   Assessment/Plan Nausea and vomiting: Cause is unclear. Supportive care IVF Monitor renal function and electrolytes. IV Zofran PRN  Volume Depletion: IVF as above  Rash: Cause unclear Patient is seeing a dermatologist ?Need for biopsy  Hypertension: Continue current medication (Amlodipine)  Diabetes Mellitus: Said to be diet controlled Check HbA1c Accucheck ACHS  Hyperlipidemia: Continue Zocor.  DVT prophylaxis: Subcu Lovenox Code Status: Full code Family Communication:  Disposition Plan: Home eventually Consults called: None for now. Admission status: Observation  Time spent: 65 minutes  Dana Allan, MD  [facility] Pager #: [phone] 7PM-7AM contact night coverage as above  01/11/2018, 5:21 PM

## 2018-01-11 NOTE — ED Notes (Signed)
Pt notified of need for urine, pt stated she tried to urinate, but is currently unable.

## 2018-01-11 NOTE — ED Notes (Signed)
ED Provider at bedside. 

## 2018-01-11 NOTE — ED Notes (Signed)
Patient transported to CT 

## 2018-01-11 NOTE — ED Notes (Signed)
ED TO INPATIENT HANDOFF REPORT  Name/Age/Gender Cheryl Cooper 77 y.o. female  Code Status Code Status History    Date Active Date Inactive Code Status Order ID Comments User Context   12/23/2017 1120 12/23/2017 1930 Full Code [id number]  Pool, Henry, MD Inpatient   04/24/2017 1307 04/25/2017 1934 Full Code [id number]  Swinteck, Brian, MD Inpatient   05/09/2016 1301 05/10/2016 1606 Full Code [id number]  Beane, Jeffrey, MD Inpatient    Advance Directive Documentation     Most Recent Value  Type of Advance Directive  Healthcare Power of Attorney, Living will  Pre-existing out of facility DNR order (yellow form or pink MOST form)  -  "MOST" Form in Place?  -      Home/SNF/Other Home  Chief Complaint vomiting, abd pain x 7 days  Level of Care/Admitting Diagnosis ED Disposition    ED Disposition Condition Comment   Admit  Hospital Area: [facility] [[id number]]  Level of Care: Med-Surg [16]  Diagnosis: Nausea and vomiting [744752]  Admitting Physician: OGBATA, SYLVESTER I [[id number]]  Attending Physician: OGBATA, SYLVESTER I [[id number]]  PT Class (Do Not Modify): Observation [104]  PT Acc Code (Do Not Modify): Observation [10022]       Medical History Past Medical History:  Diagnosis Date  . Anxiety   . Carpal tunnel syndrome   . Deafness in left ear   . Depression   . Diabetes mellitus    diet controlled  . GERD (gastroesophageal reflux disease)   . Hypercholesteremia   . Hypertension   . Hypothyroid   . Memory loss    reports d/t concussion  . PONV (postoperative nausea and vomiting) yrs ago, none recent  . Post concussion syndrome   . Stroke (HCC)    reports they were silent    Allergies Allergies  Allergen Reactions  . Other Other (See Comments)    Tomato sauce, garlic, onion - severe acid reflux   . Flexeril [Cyclobenzaprine] Other (See Comments)    Per spouse "she felt like a zombie"  . Atorvastatin Other (See Comments)    Unbalanced  .  Chlordiazepoxide-Clidinium Other (See Comments)    Dizziness (intolerance)  . Codeine Nausea Only    IV Location/Drains/Wounds Patient Lines/Drains/Airways Status   Active Line/Drains/Airways    Name:   Placement date:   Placement time:   Site:   Days:   Peripheral IV 01/05/18 Left Antecubital   01/05/18    1426    Antecubital   6   Peripheral IV 01/11/18 Right Antecubital   01/11/18    1202    Antecubital   less than 1   Incision (Closed) 05/09/16 Right   05/09/16    1039     612   Incision (Closed) 04/24/17 Hip Right   04/24/17    1127     262   Incision (Closed) 12/23/17 Back   12/23/17    0856     19          Labs/Imaging Results for orders placed or performed during the hospital encounter of 01/11/18 (from the past 48 hour(s))  Lipase, blood     Status: None   Collection Time: 01/11/18 12:02 PM  Result Value Ref Range   Lipase 20 11 - 51 U/L    Comment: Performed at [facility], [address] [place], [place] [address]  Comprehensive metabolic panel     Status: Abnormal   Collection Time: 01/11/18 12:02 PM  Result Value Ref   Range   Sodium 136 135 - 145 mmol/L   Potassium 3.9 3.5 - 5.1 mmol/L   Chloride 102 98 - 111 mmol/L   CO2 23 22 - 32 mmol/L   Glucose, Bld 152 (H) 70 - 99 mg/dL   BUN 17 8 - 23 mg/dL   Creatinine, Ser 0.69 0.44 - 1.00 mg/dL   Calcium 8.7 (L) 8.9 - 10.3 mg/dL   Total Protein 6.7 6.5 - 8.1 g/dL   Albumin 3.0 (L) 3.5 - 5.0 g/dL   AST 18 15 - 41 U/L   ALT 15 0 - 44 U/L   Alkaline Phosphatase 38 38 - 126 U/L   Total Bilirubin 0.3 0.3 - 1.2 mg/dL   GFR calc non Af Amer >60 >60 mL/min   GFR calc Af Amer >60 >60 mL/min    Comment: (NOTE) The eGFR has been calculated using the CKD EPI equation. This calculation has not been validated in all clinical situations. eGFR's persistently <60 mL/min signify possible Chronic Kidney Disease.    Anion gap 11 5 - 15    Comment: Performed at [facility], [address] [place], [place] [address]  CBC     Status: Abnormal   Collection Time: 01/11/18 12:02 PM  Result Value Ref Range   WBC 12.6 (H) 4.0 - 10.5 K/uL   RBC 5.25 (H) 3.87 - 5.11 MIL/uL   Hemoglobin 13.6 12.0 - 15.0 g/dL   HCT 43.6 36.0 - 46.0 %   MCV 83.0 80.0 - 100.0 fL   MCH 25.9 (L) 26.0 - 34.0 pg   MCHC 31.2 30.0 - 36.0 g/dL   RDW 17.1 (H) 11.5 - 15.5 %   Platelets 533 (H) 150 - 400 K/uL   nRBC 0.0 0.0 - 0.2 %    Comment: Performed at [facility], [address] [place], [place] [address]  I-Stat CG4 Lactic Acid, ED     Status: None   Collection Time: 01/11/18  1:30 PM  Result Value Ref Range   Lactic Acid, Venous 1.38 0.5 - 1.9 mmol/L  Urinalysis, Routine w reflex microscopic     Status: Abnormal   Collection Time: 01/11/18  2:22 PM  Result Value Ref Range   Color, Urine YELLOW YELLOW   APPearance CLEAR CLEAR   Specific Gravity, Urine >1.046 (H) 1.005 - 1.030   pH 7.0 5.0 - 8.0   Glucose, UA NEGATIVE NEGATIVE mg/dL   Hgb urine dipstick NEGATIVE NEGATIVE   Bilirubin Urine NEGATIVE NEGATIVE   Ketones, ur 20 (A) NEGATIVE mg/dL   Protein, ur NEGATIVE NEGATIVE mg/dL   Nitrite NEGATIVE NEGATIVE   Leukocytes, UA MODERATE (A) NEGATIVE   RBC / HPF 0-5 0 - 5 RBC/hpf   WBC, UA 6-10 0 - 5 WBC/hpf   Bacteria, UA NONE SEEN NONE SEEN   Squamous Epithelial / LPF 0-5 0 - 5    Comment: Performed at [facility], [address] , [place] [address]   Ct Abdomen Pelvis W Contrast  Result Date: 01/11/2018 CLINICAL DATA:  Acute abdominal pain, nausea vomiting. Recent lumbar surgery 12/23/2017 EXAM: CT ABDOMEN AND PELVIS WITH CONTRAST TECHNIQUE: Multidetector CT imaging of the abdomen and pelvis was performed using the standard protocol following bolus administration of intravenous contrast. CONTRAST:  100mL ISOVUE-300 IOPAMIDOL (ISOVUE-300) INJECTION 61% COMPARISON:  01/15/2002 FINDINGS: Lower chest: Minor basilar atelectasis, worse on the right.  Normal heart size. No pericardial or pleural effusion. Moderate sized hiatal hernia. Hepatobiliary: Slight liver   hypoattenuation suggesting mild fatty infiltration. Moderate gallbladder distention. Minimal biliary prominence without obstruction. No focal hepatic abnormality. Hepatic and portal veins are patent. Pancreas: Mild atrophy without ductal dilatation. Spleen: Normal in size without focal abnormality. Adrenals/Urinary Tract: Adrenal glands are unremarkable. Kidneys are normal, without renal calculi, focal lesion, or hydronephrosis. Bladder is unremarkable. Stomach/Bowel: Hiatal hernia again noted. Mid and distal portions of the duodenum demonstrate wall thickening, mucosal enhancement and surrounding retroperitoneal strandy edema compatible with acute duodenitis. No evidence of perforation, pneumatosis, fluid collection, or abscess. No associated obstruction pattern. Scattered colonic diverticulosis. No other acute inflammatory process. Appendix not visualized. Vascular/Lymphatic: Aortic atherosclerosis noted. Negative for aneurysm or occlusive disease. No retroperitoneal hemorrhage or hematoma. Mesenteric and renal vasculature have atherosclerotic origins but appear patent. No veno-occlusive process. No adenopathy. Reproductive: Status post hysterectomy. No adnexal masses. Other: No abdominal wall hernia or abnormality. No abdominopelvic ascites. Musculoskeletal: Degenerative changes noted spine. Postop changes from recent lumbar surgery. Small posterior paraspinous and subcutaneous ill-defined fluid collection measuring 3.8 x 1.6 cm, suspect postop hematoma, seroma, less likely abscess. This is at the L4 level. Remote right hip arthroplasty creating artifact across the pelvis. No acute osseous finding. IMPRESSION: CT findings compatible with acute duodenitis of the mid and distal portions of the duodenum as above, usually infectious/inflammatory, unlikely to be ischemic as the mesenteric vasculature appear  patent. Difficult to exclude underlying ulcer disease. No associated obstruction, perforation, or abscess. Small hiatal hernia Aortic atherosclerosis without aneurysm Diverticulosis Remote hysterectomy Posterior paraspinous and subcutaneous L4 level fluid collection, suspect postoperative as above. Electronically Signed   By: Jerilynn Mages.  Shick M.D.   On: 01/11/2018 14:42    Pending Labs [employer] (From admission, onward)    Start     Ordered   Signed and Held  CBC  (enoxaparin (LOVENOX)    CrCl >/= 30 ml/min)  Once,   R    Comments:  Baseline for enoxaparin therapy IF NOT ALREADY DRAWN.  Notify MD if PLT < 100 K.    Signed and Held   Signed and Held  Creatinine, serum  (enoxaparin (LOVENOX)    CrCl >/= 30 ml/min)  Once,   R    Comments:  Baseline for enoxaparin therapy IF NOT ALREADY DRAWN.    Signed and Held   Signed and Held  Creatinine, serum  (enoxaparin (LOVENOX)    CrCl >/= 30 ml/min)  Weekly,   R    Comments:  while on enoxaparin therapy    Signed and Held   Signed and Held  Magnesium  Once,   R     Signed and Held   Signed and Held  Phosphorus  Once,   R     Signed and Occupational psychologist and Held  Urine culture  Once,   R     Signed and Held   Signed and Held  TSH  Once,   R     Signed and Held   Signed and Held  Hemoglobin A1c  Once,   R     Signed and Held   Signed and Occupational hygienist morning,   R     Signed and Held   Visual merchandiser and Held  CBC  Tomorrow morning,   R     Signed and Held   Signed and Held  ANA, IFA (with reflex)  Once,   R     Signed and Held   Signed and Held  C-reactive protein  Once,  R     Signed and Held   Signed and Held  Magnesium  Tomorrow morning,   R     Signed and Held          Vitals/Pain Today's Vitals   01/11/18 1330 01/11/18 1421 01/11/18 1530 01/11/18 1700  BP: (!) 158/78  (!) 170/85 (!) 155/76  Pulse: 77  72 67  Resp: _0 Temp:      TempSrc:      SpO2: 98%  97% 97%  PainSc:  2       Isolation  Precautions No active isolations  Medications Medications  iopamidol (ISOVUE-300) 61 % injection (has no administration in time range)  sodium chloride (PF) 0.9 % injection (has no administration in time range)  ondansetron (ZOFRAN) injection 4 mg (4 mg Intravenous Given 01/11/18 1323)  sodium chloride 0.9 % bolus 2,000 mL (2,000 mLs Intravenous New Bag/Given 01/11/18 1333)  morphine 4 MG/ML injection 6 mg (6 mg Intravenous Given 01/11/18 1327)  iopamidol (ISOVUE-300) 61 % injection 100 mL (100 mLs Intravenous Contrast Given 01/11/18 1357)  methylPREDNISolone sodium succinate (SOLU-MEDROL) 125 mg/2 mL injection 125 mg (125 mg Intravenous Given 01/11/18 1542)  pantoprazole (PROTONIX) injection 40 mg (40 mg Intravenous Given 01/11/18 1543)    Mobility walks

## 2018-01-11 NOTE — ED Provider Notes (Addendum)
[facility] DEPT Provider Note   CSN: [id number] Arrival date & time: 01/11/18  1142     History   Chief Complaint Chief Complaint  Patient presents with  . Emesis    HPI KORINNA TAT is a 77 y.o. female.  HPI Patient recently seen in the emergency department with rash and purpura of her lower extremities.  She has seen dermatology and this is thought to be a vasculitis.  She presents now with 2 to 3 days of worsening nausea vomiting and upper abdominal pain.  She states inability to keep anything down.  She feels weak and dehydrated.  She reports the rash seems somewhat worse.  She denies low back pain.  No chest pain or shortness of breath.  No fevers or chills.   Past Medical History:  Diagnosis Date  . Anxiety   . Carpal tunnel syndrome   . Deafness in left ear   . Depression   . Diabetes mellitus    diet controlled  . GERD (gastroesophageal reflux disease)   . Hypercholesteremia   . Hypertension   . Hypothyroid   . Memory loss    reports d/t concussion  . PONV (postoperative nausea and vomiting) yrs ago, none recent  . Post concussion syndrome   . Stroke [facility])    reports they were silent    Patient Active Problem List   Diagnosis Date Noted  . Synovial cyst of lumbar facet joint 12/23/2017  . Osteoarthritis of right hip 04/24/2017  . Primary osteoarthritis of right hip 04/24/2017  . Complete rotator cuff tear 05/09/2016  . Brainstem stroke ([place]) 01/04/2015  . Dizziness and giddiness 01/04/2015  . Post concussion syndrome 01/04/2015  . Rotator cuff tear, right 11/21/2011    Past Surgical History:  Procedure Laterality Date  . ABDOMINAL HYSTERECTOMY  1986  . BACK SURGERY  2010   lower  . left elobow surgery  2003  . left rotator cuff  2001  . LUMBAR LAMINECTOMY/DECOMPRESSION MICRODISCECTOMY Left 12/23/2017   Procedure: Laminectomy for facet/synovial cyst - left - Lumbar three-Lumbar four;  Surgeon: Earnie Larsson, MD;   Location: [facility];  Service: Neurosurgery;  Laterality: Left;  . r foot surgery  1995  . right hand surgery  2001  . right index finger surgery  2009  . SHOULDER OPEN ROTATOR CUFF REPAIR  11/21/2011   Procedure: ROTATOR CUFF REPAIR SHOULDER OPEN;  Surgeon: Johnn Hai, MD;  Location: [facility];  Service: Orthopedics;  Laterality: Right;  with subacromial decompression  . SHOULDER OPEN ROTATOR CUFF REPAIR Right 05/09/2016   Procedure: Right shoulder mini open revision rotator cuff repair, subacromial decompression;  Surgeon: Susa Day, MD;  Location: [facility];  Service: Orthopedics;  Laterality: Right;  . TOTAL HIP ARTHROPLASTY Right 04/24/2017   Procedure: RIGHT TOTAL HIP ARTHROPLASTY ANTERIOR APPROACH;  Surgeon: Rod Can, MD;  Location: [facility];  Service: Orthopedics;  Laterality: Right;  Needs RNFA     OB History    Gravida  5   Para  3   Term      Preterm      AB  2   Living  3     SAB  2   TAB      Ectopic      Multiple      Live Births               Home Medications    Prior to Admission medications   Medication Sig Start  Date End Date Taking? Authorizing Provider  alendronate (FOSAMAX) 70 MG tablet Take 70 mg by mouth every Monday. Take with a full glass of water on an empty stomach.    Yes [provider]  amLODipine (NORVASC) 5 MG tablet Take 5 mg by mouth daily.  04/05/15  Yes [provider]  ampicillin (PRINCIPEN) 500 MG capsule Take 500 mg by mouth 2 (two) times daily. 01/07/18  Yes [provider]  aspirin 81 MG chewable tablet Chew 1 tablet (81 mg total) by mouth 2 (two) times daily. Patient taking differently: Chew 81 mg by mouth 2 (two) times a week.  04/25/17  Yes Swinteck, Aaron Edelman, MD  Calcium Carbonate-Vitamin D (CALCIUM-D PO) Take 2 tablets by mouth daily.   Yes [provider]  escitalopram (LEXAPRO) 10 MG tablet Take 10 mg by mouth daily.  09/03/14  Yes [provider]  esomeprazole (NEXIUM) 40 MG  capsule Take 40 mg by mouth daily.  09/24/14  Yes [provider]  ezetimibe (ZETIA) 10 MG tablet Take 1 tablet (10 mg total) by mouth daily. 07/21/17  Yes Wendie Agreste, MD  hydrOXYzine (ATARAX/VISTARIL) 10 MG tablet Take 1 tablet (10 mg total) by mouth at bedtime as needed for anxiety (and sleep.). 06/17/17  Yes Wendie Agreste, MD  lamoTRIgine (LAMICTAL) 100 MG tablet Take 100 mg by mouth daily.    Yes [provider]  levothyroxine (SYNTHROID, LEVOTHROID) 75 MCG tablet Take 75 mcg by mouth daily with breakfast.   Yes [provider]  naproxen sodium (ALEVE) 220 MG tablet Take 220-440 mg by mouth daily as needed (for pain or headache).    Yes [provider]  oxybutynin (DITROPAN-XL) 10 MG 24 hr tablet Take 1 tablet (10 mg total) by mouth daily. 11/14/17  Yes Wendie Agreste, MD  simvastatin (ZOCOR) 40 MG tablet Take 1 tablet (40 mg total) by mouth at bedtime. 07/21/17  Yes Wendie Agreste, MD  SUPER B COMPLEX/C PO Take 1 tablet by mouth daily.   Yes [provider]  traZODone (DESYREL) 50 MG tablet Take 50 mg by mouth at bedtime.   Yes [provider]  triamcinolone cream (KENALOG) 0.1 % Apply 1 application topically 2 (two) times daily.   Yes [provider]  HYDROcodone-acetaminophen (NORCO/VICODIN) 5-325 MG tablet Take 1 tablet by mouth every 6 (six) hours as needed. Patient not taking: Reported on 01/11/2018 01/05/18   Carlisle Cater, PA-C    Family History Family History  Problem Relation Age of Onset  . Stroke Father   . Stroke Sister   . Stroke Brother   . Diabetes Brother   . Dementia Brother   . Stroke Sister        TIA    Social History Social History   Tobacco Use  . Smoking status: Never Smoker  . Smokeless tobacco: Never Used  Substance Use Topics  . Alcohol use: Yes    Alcohol/week: 1.0 standard drinks    Types: 1 Glasses of wine per week    Comment: glass wine/day  . Drug use: No      Allergies   Other; Flexeril [cyclobenzaprine]; Atorvastatin; Chlordiazepoxide-clidinium; and Codeine   Review of Systems Review of Systems  All other systems reviewed and are negative.    Physical Exam Updated Vital Signs BP (!) 158/78   Pulse 77   Temp 97.8 F (36.6 C) (Oral)   Resp 20   SpO2 98%   Physical Exam  Constitutional: She  is oriented to person, place, and time. She appears well-developed and well-nourished. No distress.  HENT:  Head: Normocephalic and atraumatic.  Eyes: EOM are normal.  Neck: Normal range of motion.  Cardiovascular: Normal rate, regular rhythm and normal heart sounds.  Pulmonary/Chest: Effort normal and breath sounds normal.  Abdominal: Soft. She exhibits no distension. There is no tenderness.  Musculoskeletal: Normal range of motion.  Neurological: She is alert and oriented to person, place, and time.  Skin: Skin is warm and dry.  Purpura and vasculitic appearing rash of her lower extremities  Psychiatric: She has a normal mood and affect. Judgment normal.  Nursing note and vitals reviewed.    ED Treatments / Results  Labs (all labs ordered are listed, but only abnormal results are displayed) Labs Reviewed  COMPREHENSIVE METABOLIC PANEL - Abnormal; Notable for the following components:      Result Value   Glucose, Bld 152 (*)    Calcium 8.7 (*)    Albumin 3.0 (*)    All other components within normal limits  CBC - Abnormal; Notable for the following components:   WBC 12.6 (*)    RBC 5.25 (*)    MCH 25.9 (*)    RDW 17.1 (*)    Platelets 533 (*)    All other components within normal limits  LIPASE, BLOOD  URINALYSIS, ROUTINE W REFLEX MICROSCOPIC  I-STAT CG4 LACTIC ACID, ED    EKG None  Radiology Ct Abdomen Pelvis W Contrast  Result Date: 01/11/2018 CLINICAL DATA:  Acute abdominal pain, nausea vomiting. Recent lumbar surgery 12/23/2017 EXAM: CT ABDOMEN AND PELVIS WITH CONTRAST TECHNIQUE: Multidetector CT imaging of  the abdomen and pelvis was performed using the standard protocol following bolus administration of intravenous contrast. CONTRAST:  131mL ISOVUE-300 IOPAMIDOL (ISOVUE-300) INJECTION 61% COMPARISON:  01/15/2002 FINDINGS: Lower chest: Minor basilar atelectasis, worse on the right. Normal heart size. No pericardial or pleural effusion. Moderate sized hiatal hernia. Hepatobiliary: Slight liver hypoattenuation suggesting mild fatty infiltration. Moderate gallbladder distention. Minimal biliary prominence without obstruction. No focal hepatic abnormality. Hepatic and portal veins are patent. Pancreas: Mild atrophy without ductal dilatation. Spleen: Normal in size without focal abnormality. Adrenals/Urinary Tract: Adrenal glands are unremarkable. Kidneys are normal, without renal calculi, focal lesion, or hydronephrosis. Bladder is unremarkable. Stomach/Bowel: Hiatal hernia again noted. Mid and distal portions of the duodenum demonstrate wall thickening, mucosal enhancement and surrounding retroperitoneal strandy edema compatible with acute duodenitis. No evidence of perforation, pneumatosis, fluid collection, or abscess. No associated obstruction pattern. Scattered colonic diverticulosis. No other acute inflammatory process. Appendix not visualized. Vascular/Lymphatic: Aortic atherosclerosis noted. Negative for aneurysm or occlusive disease. No retroperitoneal hemorrhage or hematoma. Mesenteric and renal vasculature have atherosclerotic origins but appear patent. No veno-occlusive process. No adenopathy. Reproductive: Status post hysterectomy. No adnexal masses. Other: No abdominal wall hernia or abnormality. No abdominopelvic ascites. Musculoskeletal: Degenerative changes noted spine. Postop changes from recent lumbar surgery. Small posterior paraspinous and subcutaneous ill-defined fluid collection measuring 3.8 x 1.6 cm, suspect postop hematoma, seroma, less likely abscess. This is at the L4 level. Remote right hip  arthroplasty creating artifact across the pelvis. No acute osseous finding. IMPRESSION: CT findings compatible with acute duodenitis of the mid and distal portions of the duodenum as above, usually infectious/inflammatory, unlikely to be ischemic as the mesenteric vasculature appear patent. Difficult to exclude underlying ulcer disease. No associated obstruction, perforation, or abscess. Small hiatal hernia Aortic atherosclerosis without aneurysm Diverticulosis Remote hysterectomy Posterior paraspinous and subcutaneous L4 level fluid collection, suspect postoperative  as above. Electronically Signed   By: Jerilynn Mages.  Shick M.D.   On: 01/11/2018 14:42    Procedures Procedures (including critical care time)  Medications Ordered in ED Medications  iopamidol (ISOVUE-300) 61 % injection (has no administration in time range)  sodium chloride (PF) 0.9 % injection (has no administration in time range)  methylPREDNISolone sodium succinate (SOLU-MEDROL) 125 mg/2 mL injection 125 mg (has no administration in time range)  ondansetron (ZOFRAN) injection 4 mg (4 mg Intravenous Given 01/11/18 1323)  sodium chloride 0.9 % bolus 2,000 mL (2,000 mLs Intravenous New Bag/Given 01/11/18 1333)  morphine 4 MG/ML injection 6 mg (6 mg Intravenous Given 01/11/18 1327)  iopamidol (ISOVUE-300) 61 % injection 100 mL (100 mLs Intravenous Contrast Given 01/11/18 1357)     Initial Impression / Assessment and Plan / ED Course  I have reviewed the triage vital signs and the nursing notes.  Pertinent labs & imaging results that were available during my care of the patient were reviewed by me and considered in my medical decision making (see chart for details).     I suspect this is adult onset HSP with associated duodenitis based on her rash and her duodenitis seen on CT imaging.  IV steroids now.  Patient will be admitted for symptom control given her progression of symptoms and her acute dehydration.  I think she will benefit from  GI consultation.  No signs of mesenteric ischemia at this time.  No sign of intra-abdominal perforation.  Final Clinical Impressions(s) / ED Diagnoses   Final diagnoses:  HSP ([place] purpura) ([place])  Acute duodenitis    ED Discharge Orders    None       Jola Schmidt, MD 01/11/18 1456    Jola Schmidt, MD 01/11/18 [phone]

## 2018-01-12 ENCOUNTER — Inpatient Hospital Stay: Payer: Medicare Other | Admitting: Family Medicine

## 2018-01-12 ENCOUNTER — Encounter (HOSPITAL_COMMUNITY): Payer: Self-pay | Admitting: *Deleted

## 2018-01-12 ENCOUNTER — Encounter (HOSPITAL_COMMUNITY)
Admission: EM | Disposition: A | Discharge status: Home / Self Care | Payer: Self-pay | Source: Home / Self Care | Attending: Emergency Medicine

## 2018-01-12 DIAGNOSIS — R935 Abnormal findings on diagnostic imaging of other abdominal regions, including retroperitoneum: Secondary | ICD-10-CM

## 2018-01-12 DIAGNOSIS — E119 Type 2 diabetes mellitus without complications: Secondary | ICD-10-CM | POA: Diagnosis not present

## 2018-01-12 DIAGNOSIS — R21 Rash and other nonspecific skin eruption: Secondary | ICD-10-CM | POA: Diagnosis not present

## 2018-01-12 DIAGNOSIS — R1011 Right upper quadrant pain: Secondary | ICD-10-CM | POA: Diagnosis not present

## 2018-01-12 DIAGNOSIS — R112 Nausea with vomiting, unspecified: Secondary | ICD-10-CM | POA: Diagnosis not present

## 2018-01-12 DIAGNOSIS — K2980 Duodenitis without bleeding: Secondary | ICD-10-CM

## 2018-01-12 HISTORY — PX: ESOPHAGOGASTRODUODENOSCOPY (EGD) WITH PROPOFOL: SHX5813

## 2018-01-12 HISTORY — PX: BIOPSY: SHX5522

## 2018-01-12 LAB — CBC
HCT: 33.8 % — ABNORMAL LOW (ref 36.0–46.0)
Hemoglobin: 10.4 g/dL — ABNORMAL LOW (ref 12.0–15.0)
MCH: 26.4 pg (ref 26.0–34.0)
MCHC: 30.8 g/dL (ref 30.0–36.0)
MCV: 85.8 fL (ref 80.0–100.0)
Platelets: 373 10*3/uL (ref 150–400)
RBC: 3.94 MIL/uL (ref 3.87–5.11)
RDW: 17.2 % — ABNORMAL HIGH (ref 11.5–15.5)
WBC: 9.4 10*3/uL (ref 4.0–10.5)
nRBC: 0 % (ref 0.0–0.2)

## 2018-01-12 LAB — GLUCOSE, CAPILLARY
Glucose-Capillary: 125 mg/dL — ABNORMAL HIGH (ref 70–99)
Glucose-Capillary: 103 mg/dL — ABNORMAL HIGH (ref 70–99)
Glucose-Capillary: 102 mg/dL — ABNORMAL HIGH (ref 70–99)

## 2018-01-12 LAB — HEMOGLOBIN A1C
Hgb A1c MFr Bld: 6.6 % — ABNORMAL HIGH (ref 4.8–5.6)
Mean Plasma Glucose: 142.72 mg/dL

## 2018-01-12 LAB — BASIC METABOLIC PANEL
Anion gap: 7 (ref 5–15)
BUN: 12 mg/dL (ref 8–23)
CO2: 22 mmol/L (ref 22–32)
Calcium: 7.7 mg/dL — ABNORMAL LOW (ref 8.9–10.3)
Chloride: 109 mmol/L (ref 98–111)
Creatinine, Ser: 0.7 mg/dL (ref 0.44–1.00)
GFR calc Af Amer: >60 mL/min (ref >60)
GFR calc non Af Amer: >60 mL/min (ref >60)
Glucose, Bld: 156 mg/dL — ABNORMAL HIGH (ref 70–99)
Potassium: 3.6 mmol/L (ref 3.5–5.1)
Sodium: 138 mmol/L (ref 135–145)

## 2018-01-12 LAB — MAGNESIUM: Magnesium: 2.1 mg/dL (ref 1.7–2.4)

## 2018-01-12 SURGERY — ESOPHAGOGASTRODUODENOSCOPY (EGD) WITH PROPOFOL
Anesthesia: Moderate Sedation

## 2018-01-12 MED ORDER — MIDAZOLAM HCL (PF) 5 MG/ML IJ SOLN
INTRAMUSCULAR | Status: AC
  Filled 2018-01-12: qty 1

## 2018-01-12 MED ORDER — BUTAMBEN-TETRACAINE-BENZOCAINE 2-2-14 % EX AERO
INHALATION_SPRAY | CUTANEOUS | Status: DC | PRN
  Administered 2018-01-12: 2 via TOPICAL

## 2018-01-12 MED ORDER — MIDAZOLAM HCL (PF) 5 MG/ML IJ SOLN
INTRAMUSCULAR | Status: DC | PRN
  Administered 2018-01-12 (×7): 1 mg via INTRAVENOUS
  Administered 2018-01-12: 2 mg via INTRAVENOUS
  Administered 2018-01-12 (×2): 1 mg via INTRAVENOUS

## 2018-01-12 MED ORDER — FLUTICASONE PROPIONATE 50 MCG/ACT NA SUSP
1.0000 | Freq: Two times a day (BID) | NASAL | Status: DC
  Administered 2018-01-12 (×2): 1 via NASAL
  Filled 2018-01-12 (×5): qty 16

## 2018-01-12 MED ORDER — FENTANYL CITRATE (PF) 100 MCG/2ML IJ SOLN
INTRAMUSCULAR | Status: AC
  Filled 2018-01-12: qty 2

## 2018-01-12 MED ORDER — MIDAZOLAM HCL (PF) 5 MG/ML IJ SOLN
INTRAMUSCULAR | Status: AC
  Filled 2018-01-12: qty 2

## 2018-01-12 MED ORDER — SODIUM CHLORIDE 0.9 % IV SOLN: INTRAVENOUS | Status: DC

## 2018-01-12 MED ORDER — FENTANYL CITRATE (PF) 100 MCG/2ML IJ SOLN
INTRAMUSCULAR | Status: DC | PRN
  Administered 2018-01-12 (×5): 25 ug via INTRAVENOUS

## 2018-01-12 SURGICAL SUPPLY — 15 items

## 2018-01-12 NOTE — H&P (Signed)
GASTROENTEROLOGY PROCEDURE H&P NOTE   Primary Care Physician: Wendie Agreste, MD  HPI: Cheryl Cooper is a 77 y.o. female who presents for EGD.  Past Medical History:  Diagnosis Date  . Anxiety   . Carpal tunnel syndrome   . Deafness in left ear   . Depression   . Diabetes mellitus    diet controlled  . GERD (gastroesophageal reflux disease)   . Hypercholesteremia   . Hypertension   . Hypothyroid   . Memory loss    reports d/t concussion  . PONV (postoperative nausea and vomiting) yrs ago, none recent  . Post concussion syndrome   . Stroke [facility])    reports they were silent   Past Surgical History:  Procedure Laterality Date  . ABDOMINAL HYSTERECTOMY  1986  . BACK SURGERY  2010   lower  . left elobow surgery  2003  . left rotator cuff  2001  . LUMBAR LAMINECTOMY/DECOMPRESSION MICRODISCECTOMY Left 12/23/2017   Procedure: Laminectomy for facet/synovial cyst - left - Lumbar three-Lumbar four;  Surgeon: Earnie Larsson, MD;  Location: [place];  Service: Neurosurgery;  Laterality: Left;  . r foot surgery  1995  . right hand surgery  2001  . right index finger surgery  2009  . SHOULDER OPEN ROTATOR CUFF REPAIR  11/21/2011   Procedure: ROTATOR CUFF REPAIR SHOULDER OPEN;  Surgeon: Johnn Hai, MD;  Location: [facility];  Service: Orthopedics;  Laterality: Right;  with subacromial decompression  . SHOULDER OPEN ROTATOR CUFF REPAIR Right 05/09/2016   Procedure: Right shoulder mini open revision rotator cuff repair, subacromial decompression;  Surgeon: Susa Day, MD;  Location: [facility];  Service: Orthopedics;  Laterality: Right;  . TOTAL HIP ARTHROPLASTY Right 04/24/2017   Procedure: RIGHT TOTAL HIP ARTHROPLASTY ANTERIOR APPROACH;  Surgeon: Rod Can, MD;  Location: [facility];  Service: Orthopedics;  Laterality: Right;  Needs RNFA   Current Facility-Administered Medications  Medication Dose Route Frequency Provider Last Rate Last Dose  . 0.9 %  sodium chloride infusion    Intravenous Continuous Dana Allan I, MD 100 mL/hr at 01/12/18 0541    . 0.9 %  sodium chloride infusion   Intravenous Continuous Levin Erp, [place]      . Doug Sou Hold] amLODipine (NORVASC) tablet 5 mg  5 mg Oral Daily Dana Allan I, MD   5 mg at 01/12/18 0920  . [MAR Hold] ampicillin (PRINCIPEN) capsule 500 mg  500 mg Oral BID Dana Allan I, MD   500 mg at 01/12/18 0920  . [MAR Hold] aspirin chewable tablet 81 mg  81 mg Oral Once per day on Sun Wed O[place]ata, Sylvester I, MD      . Doug Sou Hold] calcium-vitamin D (OSCAL WITH D) 500-200 MG-UNIT per tablet   Oral Daily Bonnell Public, MD   1 tablet at 01/12/18 0919  . [MAR Hold] enoxaparin (LOVENOX) injection 40 mg  40 mg Subcutaneous Q24H Dana Allan I, MD   40 mg at 01/11/18 2010  . [MAR Hold] escitalopram (LEXAPRO) tablet 10 mg  10 mg Oral Daily Dana Allan I, MD   10 mg at 01/12/18 0919  . [MAR Hold] ezetimibe (ZETIA) tablet 10 mg  10 mg Oral Daily Dana Allan I, MD   10 mg at 01/12/18 0919  . [MAR Hold] fluticasone (FLONASE) 50 MCG/ACT nasal spray 1 spray  1 spray Each Nare BID Cristal Ford, DO   1 spray at 01/12/18 2831  . [MAR Hold] insulin aspart (novoLOG) injection  0-9 Units  0-9 Units Subcutaneous TID WC Dana Allan I, MD   1 Units at 01/12/18 0804  . [MAR Hold] lamoTRIgine (LAMICTAL) tablet 100 mg  100 mg Oral Daily Dana Allan I, MD   100 mg at 01/12/18 0919  . [MAR Hold] levothyroxine (SYNTHROID, LEVOTHROID) tablet 75 mcg  75 mcg Oral Q breakfast Dana Allan I, MD   75 mcg at 01/12/18 0540  . [MAR Hold] ondansetron (ZOFRAN) injection 4 mg  4 mg Intravenous Q6H PRN Bonnell Public, MD      . Doug Sou Hold] oxybutynin (DITROPAN-XL) 24 hr tablet 10 mg  10 mg Oral Daily Dana Allan I, MD   10 mg at 01/12/18 0919  . [MAR Hold] pantoprazole (PROTONIX) injection 40 mg  40 mg Intravenous Q12H Dana Allan I, MD   40 mg at 01/12/18 0920  . [MAR Hold] simvastatin (ZOCOR)  tablet 40 mg  40 mg Oral QHS Dana Allan I, MD   40 mg at 01/11/18 2203  . [MAR Hold] traZODone (DESYREL) tablet 50 mg  50 mg Oral QHS Dana Allan I, MD   50 mg at 01/11/18 2202  . [MAR Hold] triamcinolone cream (KENALOG) 0.1 % 1 application  1 application Topical BID Bonnell Public, MD   1 application at 26/71/24 5809   Allergies  Allergen Reactions  . Other Other (See Comments)    Tomato sauce, garlic, onion - severe acid reflux   . Flexeril [Cyclobenzaprine] Other (See Comments)    Per spouse "she felt like a zombie"  . Atorvastatin Other (See Comments)    Unbalanced  . Chlordiazepoxide-Clidinium Other (See Comments)    Dizziness (intolerance)  . Codeine Nausea Only   Family History  Problem Relation Age of Onset  . Stroke Father   . Stroke Sister   . Stroke Brother   . Diabetes Brother   . Dementia Brother   . Stroke Sister        TIA   Social History   Socioeconomic History  . Marital status: Married    Spouse name: Herschel  . Number of children: 3  . Years of education: 69  . Highest education level: Not on file  Occupational History    Comment: retired  Scientific laboratory technician  . Financial resource strain: Not on file  . Food insecurity:    Worry: Not on file    Inability: Not on file  . Transportation needs:    Medical: Not on file    Non-medical: Not on file  Tobacco Use  . Smoking status: Never Smoker  . Smokeless tobacco: Never Used  Substance and Sexual Activity  . Alcohol use: Yes    Alcohol/week: 1.0 standard drinks    Types: 1 Glasses of wine per week    Comment: glass wine/day  . Drug use: No  . Sexual activity: Not Currently    Comment: 1st intercourse 77 yo-Fewer than 5 partners  Lifestyle  . Physical activity:    Days per week: Not on file    Minutes per session: Not on file  . Stress: Not on file  Relationships  . Social connections:    Talks on phone: Not on file    Gets together: Not on file    Attends religious service: Not  on file    Active member of club or organization: Not on file    Attends meetings of clubs or organizations: Not on file    Relationship status: Not on file  . Intimate  partner violence:    Fear of current or ex partner: Not on file    Emotionally abused: Not on file    Physically abused: Not on file    Forced sexual activity: Not on file  Other Topics Concern  . Not on file  Social History Narrative   Married, lives at home with husband   caffeine - 1 Coke daily    Physical Exam: Vital signs in last 24 hours: Temp:  [97.6 F (36.4 C)-98.3 F (36.8 C)] 98.1 F (36.7 C) (11/25 1505) Pulse Rate:  [66-76] 76 (11/25 1505) Resp:  [16-18] 17 (11/25 1505) BP: (115-170)/(63-85) 150/63 (11/25 1505) SpO2:  [95 %-98 %] 96 % (11/25 1505) Weight:  [69.9 kg] 69.9 kg (11/25 1505) Last BM Date: 01/11/18 GEN: NAD EYE: Sclerae anicteric ENT: MMM CV: RR without R/Gs  RESP: CTAB posteriorly GI: Soft, mild TTP in RUQ, ND NEURO:  Alert & Oriented x 3 SKIN: Purpuric rash on bilateral LEs  Lab Results: Recent Labs    01/11/18 1202 01/12/18 0428  WBC 12.6* 9.4  HGB 13.6 10.4*  HCT 43.6 33.8*  PLT 533* 373   BMET Recent Labs    01/11/18 1202 01/12/18 0428  NA 136 138  K 3.9 3.6  CL 102 109  CO2 23 22  GLUCOSE 152* 156*  BUN 17 12  CREATININE 0.69 0.70  CALCIUM 8.7* 7.7*   LFT Recent Labs    01/11/18 1202  PROT 6.7  ALBUMIN 3.0*  AST 18  ALT 15  ALKPHOS 38  BILITOT 0.3   PT/INR No results for input(s): LABPROT, INR in the last 72 hours.   Impression / Plan: This is a 77 y.o.femalewho presents for   The risks and benefits of endoscopic evaluation were discussed with the patient; these include but are not limited to the risk of perforation, infection, bleeding, missed lesions, lack of diagnosis, severe illness requiring hospitalization, as well as anesthesia and sedation related illnesses.  The patient is agreeable to proceed.    Justice Britain, MD [place]  [facility]astro[facility] Advanced Endoscopy Office # [phone]

## 2018-01-12 NOTE — Op Note (Addendum)
[facility] Patient Name: Cheryl Cooper Procedure Date: 01/12/2018 MRN: [id number] Attending MD: Justice Britain , MD Date of Birth: 02-02-1941 CSN: [id number] Age: 77 Admit Type: Inpatient Procedure:                Upper GI endoscopy Indications:              Abdominal pain in the right upper quadrant,                            Abnormal CT of the GI tract Providers:                Justice Britain, MD, Zenon Mayo, RN, Alan Mulder, Technician Referring MD:              Medicines:                Fentanyl 125 micrograms IV, Midazolam 11 mg IV Complications:            No immediate complications. Estimated Blood Loss:     Estimated blood loss was minimal. Procedure:                Pre-Anesthesia Assessment:                           - Prior to the procedure, a History and Physical                            was performed, and patient medications and                            allergies were reviewed. The patient's tolerance of                            previous anesthesia was also reviewed. The risks                            and benefits of the procedure and the sedation                            options and risks were discussed with the patient.                            All questions were answered, and informed consent                            was obtained. Prior Anticoagulants: The patient has                            taken no previous anticoagulant or antiplatelet                            agents. ASA Grade Assessment: II - A patient with  mild systemic disease. After reviewing the risks                            and benefits, the patient was deemed in                            satisfactory condition to undergo the procedure.                           After obtaining informed consent, the endoscope was                            passed under direct vision. Throughout the                             procedure, the patient's blood pressure, pulse, and                            oxygen saturations were monitored continuously. The                            GIF-H190 ([id number]) Olympus adult endoscope was                            introduced through the mouth, and advanced to the                            second part of duodenum. The upper GI endoscopy was                            accomplished without difficulty. The patient                            tolerated the procedure. Scope In: Scope Out: Findings:      White nummular lesions were noted in the entire esophagus. Biopsies were       taken with a cold forceps for histology to rule out Candida and to rule       out EoE.      LA Grade C (one or more mucosal breaks continuous between tops of 2 or       more mucosal folds, less than 75% circumference) esophagitis with no       bleeding was found at the gastroesophageal junction. Mucosa was biopsied       with a cold forceps for histology at the gastroesophageal junction. One       specimen bottle was sent to pathology.      Hiatal hernia of 3 cm noted.      Striped erythema in the gastric antrum noted.      No other gross lesions were noted in the entire examined stomach.       Biopsies were taken with a cold forceps for histology and Helicobacter       pylori testing from the antrum/incisura/greater curve/lesser curve.      No gross lesions were noted in the duodenal bulb.      The major papilla was normal and was found on  a pantaloon diverticulum       as noted below.      A medium diverticulum was found in the area of the papilla.      Multiple non-bleeding superficial duodenal ulcers with a clean ulcer       base (Forrest Class III) were found in the second portion of the       duodenum, in the area of the papilla and in the third portion of the       duodenum. The largest lesion was 10 mm in largest dimension. Biopsies       were taken with a cold forceps for  histology. Impression:               - White nummular lesions in esophageal mucosa.                            Biopsied.                           - LA Grade C esophagitis. Biopsied.                           - Hiatal hernia of 3 cm noted. Striped erythema in                            the gastric antrum. No other gross lesions in the                            stomach. Biopsied for HP.                           - No gross lesions in the duodenal bulb.                           - Normal major papilla.                           - Duodenal diverticulum.                           - Multiple non-bleeding duodenal ulcers with a                            clean ulcer base (Forrest Class III). Biopsied. Moderate Sedation:      Not Applicable - Patient had care per Anesthesia. Recommendation:           - The patient will be observed post-procedure,                            until all discharge criteria are met.                           - Return patient to hospital ward for ongoing care.                           - Patient has a contact number available for  emergencies. The signs and symptoms of potential                            delayed complications were discussed with the                            patient. Return to normal activities tomorrow.                            Written discharge instructions were provided to the                            patient.                           - Advance diet as tolerated.                           - Please send Gastrin Level (may be artificially                            high due to PPI use) but helpful nonetheless.                           - Continue PPI 40 mg BID moving forward.                           - Observe patient's clinical course.                           - Await pathology results.                           - Repeat upper endoscopy in 3 months to check                            healing.                            - Consider role of MRI/MRCP in 2-3 months before                            repeat EGD to ensure that there is no evidence of a                            mass/lesion.                           - Patient has a primary GI in [place] that she will                            want to follow up with. We are happy to see if she                            wants to  follow up here though.                           - The findings and recommendations were discussed                            with the patient.                           - The findings and recommendations were discussed                            with the referring physician. Procedure Code(s):        --- Professional ---                           [phone], Esophagogastroduodenoscopy, flexible,                            transoral; with biopsy, single or multiple Diagnosis Code(s):        --- Professional ---                           K22.8, Other specified diseases of esophagus                           K20.9, Esophagitis, unspecified                           K26.9, Duodenal ulcer, unspecified as acute or                            chronic, without hemorrhage or perforation                           R10.11, Right upper quadrant pain                           K57.10, Diverticulosis of small intestine without                            perforation or abscess without bleeding                           R93.3, Abnormal findings on diagnostic imaging of                            other parts of digestive tract CPT copyright 2018 [employer]. All rights reserved. The codes documented in this report are preliminary and upon coder review may  be revised to meet current compliance requirements. Justice Britain, MD 01/12/2018 5:11:03 PM Number of Addenda: 0

## 2018-01-12 NOTE — Consult Note (Signed)
Consultation  Referring Provider: Dr. Ree Kida     Primary Care Physician:  Wendie Agreste, MD Primary Gastroenterologist: [facility] gastroenterology       Reason for Consultation: Nausea and vomiting            HPI:   Cheryl Cooper is a 77 y.o. female with a past medical history as listed below including GERD and Barrett's esophagus, also new rash of the lower extremities (about 2 weeks ago) thought possibly vasculitic, who presented to the hospital 01/11/2018 with a complaint of nausea and vomiting.    Today, patient explains that she had surgery on her back about 3 weeks ago and apparently she has a "small throat" so they had to use something extra when they intubated her which made her throat somewhat sore.  She followed up with them and was given some Magic mouthwash which did help with the soreness.  Then developed a rash a couple of weeks ago spread all over her legs for which she is seeing dermatology.      Now over the weekend on 01/09/2018 the patient started with a feeling of a "coating in my throat like mucus or Vaseline spread all down it" and started vomiting.  Patient tells me that she vomited multiple times on Friday and on Saturday, "anytime I would get up I would spit and then vomit".  The last time she had an episode of vomiting was yesterday morning but does continue with this feeling of a coating down her throat.  Patient has not attempted to eat anything, but she has been taking small sips of water which seem to go down okay.  Denies abdominal pain.    History of uncontrolled reflux but patient tells me that she was on Nexium 40 mg twice daily and decreased this to 40 mg every morning and since then has had very little problems with reflux.    Denies fever, chills, weight loss, anorexia, change in bowel habits, melena or symptoms that awaken her from sleep.  ED course: ER physician worried about possible vasculitis and administered a dose of IV steroids,  leukocytosis, UA with moderate leukocytes and specific gravity greater than 1.046; CT scan of the abdomen pelvis with contrast revealed findings compatible with acute duodenitis of the mid and distal portions of the duodenum, occultly excluding ulcer disease, diverticulosis  GI history: 11/10/2017 esophageal manometry-[place]: Normal 10/08/2017 EGD: Do not have actual procedural report, biopsy showed gastric mucosa with surface hyperemia and focal mild chronic inflammation, negative for H. pylori, fragments of gastric mucosa with lamina propria edema, negative for intestinal metaplasia 09/17/2017 esophagram: Marked gastroesophageal reflux, small hiatal hernia, and possible esophagitis, marked tertiary contractions 09/17/2017 office visit-[place]: Patient has been on Nexium 40 mg twice daily and continued with markedly worsening reflux symptoms over the past 1 to 2 months, having to supplement Tums and Gaviscon after eating also nocturnal awakening, history of small hiatal hernia on upper endoscopy and upper GI series in the past, on Fosamax once weekly for osteoporosis; plan: Barium swallow, if unrewarding then EGD for decision about possible fundoplication for treatment of severe reflux  Past Medical History:  Diagnosis Date  . Anxiety   . Carpal tunnel syndrome   . Deafness in left ear   . Depression   . Diabetes mellitus    diet controlled  . GERD (gastroesophageal reflux disease)   . Hypercholesteremia   . Hypertension   . Hypothyroid   . Memory loss  reports d/t concussion  . PONV (postoperative nausea and vomiting) yrs ago, none recent  . Post concussion syndrome   . Stroke [facility])    reports they were silent    Past Surgical History:  Procedure Laterality Date  . ABDOMINAL HYSTERECTOMY  1986  . BACK SURGERY  2010   lower  . left elobow surgery  2003  . left rotator cuff  2001  . LUMBAR LAMINECTOMY/DECOMPRESSION MICRODISCECTOMY Left 12/23/2017   Procedure: Laminectomy  for facet/synovial cyst - left - Lumbar three-Lumbar four;  Surgeon: Earnie Larsson, MD;  Location: [place];  Service: Neurosurgery;  Laterality: Left;  . r foot surgery  1995  . right hand surgery  2001  . right index finger surgery  2009  . SHOULDER OPEN ROTATOR CUFF REPAIR  11/21/2011   Procedure: ROTATOR CUFF REPAIR SHOULDER OPEN;  Surgeon: Johnn Hai, MD;  Location: [facility];  Service: Orthopedics;  Laterality: Right;  with subacromial decompression  . SHOULDER OPEN ROTATOR CUFF REPAIR Right 05/09/2016   Procedure: Right shoulder mini open revision rotator cuff repair, subacromial decompression;  Surgeon: Susa Day, MD;  Location: [facility];  Service: Orthopedics;  Laterality: Right;  . TOTAL HIP ARTHROPLASTY Right 04/24/2017   Procedure: RIGHT TOTAL HIP ARTHROPLASTY ANTERIOR APPROACH;  Surgeon: Rod Can, MD;  Location: [facility];  Service: Orthopedics;  Laterality: Right;  Needs RNFA    Family History  Problem Relation Age of Onset  . Stroke Father   . Stroke Sister   . Stroke Brother   . Diabetes Brother   . Dementia Brother   . Stroke Sister        TIA     Social History   Tobacco Use  . Smoking status: Never Smoker  . Smokeless tobacco: Never Used  Substance Use Topics  . Alcohol use: Yes    Alcohol/week: 1.0 standard drinks    Types: 1 Glasses of wine per week    Comment: glass wine/day  . Drug use: No    Prior to Admission medications   Medication Sig Start Date End Date Taking? Authorizing Provider  alendronate (FOSAMAX) 70 MG tablet Take 70 mg by mouth every Monday. Take with a full glass of water on an empty stomach.    Yes [provider]  amLODipine (NORVASC) 5 MG tablet Take 5 mg by mouth daily.  04/05/15  Yes [provider]  ampicillin (PRINCIPEN) 500 MG capsule Take 500 mg by mouth 2 (two) times daily. 01/07/18  Yes [provider]  aspirin 81 MG chewable tablet Chew 1 tablet (81 mg total) by mouth 2 (two) times daily. Patient  taking differently: Chew 81 mg by mouth 2 (two) times a week.  04/25/17  Yes Swinteck, Aaron Edelman, MD  Calcium Carbonate-Vitamin D (CALCIUM-D PO) Take 2 tablets by mouth daily.   Yes [provider]  escitalopram (LEXAPRO) 10 MG tablet Take 10 mg by mouth daily.  09/03/14  Yes [provider]  esomeprazole (NEXIUM) 40 MG capsule Take 40 mg by mouth daily.  09/24/14  Yes [provider]  ezetimibe (ZETIA) 10 MG tablet Take 1 tablet (10 mg total) by mouth daily. 07/21/17  Yes Wendie Agreste, MD  hydrOXYzine (ATARAX/VISTARIL) 10 MG tablet Take 1 tablet (10 mg total) by mouth at bedtime as needed for anxiety (and sleep.). 06/17/17  Yes Wendie Agreste, MD  lamoTRIgine (LAMICTAL) 100 MG tablet Take 100 mg by mouth daily.    Yes [provider]  levothyroxine (SYNTHROID,  LEVOTHROID) 75 MCG tablet Take 75 mcg by mouth daily with breakfast.   Yes [provider]  naproxen sodium (ALEVE) 220 MG tablet Take 220-440 mg by mouth daily as needed (for pain or headache).    Yes [provider]  oxybutynin (DITROPAN-XL) 10 MG 24 hr tablet Take 1 tablet (10 mg total) by mouth daily. 11/14/17  Yes Wendie Agreste, MD  simvastatin (ZOCOR) 40 MG tablet Take 1 tablet (40 mg total) by mouth at bedtime. 07/21/17  Yes Wendie Agreste, MD  SUPER B COMPLEX/C PO Take 1 tablet by mouth daily.   Yes [provider]  traZODone (DESYREL) 50 MG tablet Take 50 mg by mouth at bedtime.   Yes [provider]  triamcinolone cream (KENALOG) 0.1 % Apply 1 application topically 2 (two) times daily.   Yes [provider]    Current Facility-Administered Medications  Medication Dose Route Frequency Provider Last Rate Last Dose  . 0.9 %  sodium chloride infusion   Intravenous Continuous Dana Allan I, MD 100 mL/hr at 01/12/18 0541    . amLODipine (NORVASC) tablet 5 mg  5 mg Oral Daily Dana Allan I, MD   5 mg at 01/11/18 2010  . ampicillin (PRINCIPEN)  capsule 500 mg  500 mg Oral BID Dana Allan I, MD   500 mg at 01/11/18 2203  . [START ON 01/14/2018] aspirin chewable tablet 81 mg  81 mg Oral Once per day on Sun Wed Ogbata, Sylvester I, MD      . calcium-vitamin D (OSCAL WITH D) 500-200 MG-UNIT per tablet   Oral Daily Dana Allan I, MD      . enoxaparin (LOVENOX) injection 40 mg  40 mg Subcutaneous Q24H Dana Allan I, MD   40 mg at 01/11/18 2010  . escitalopram (LEXAPRO) tablet 10 mg  10 mg Oral Daily Dana Allan I, MD      . ezetimibe (ZETIA) tablet 10 mg  10 mg Oral Daily Dana Allan I, MD   10 mg at 01/11/18 2010  . fluticasone (FLONASE) 50 MCG/ACT nasal spray 1 spray  1 spray Each Nare BID Mikhail, Maryann, DO      . insulin aspart (novoLOG) injection 0-9 Units  0-9 Units Subcutaneous TID WC Bonnell Public, MD   1 Units at 01/12/18 0804  . lamoTRIgine (LAMICTAL) tablet 100 mg  100 mg Oral Daily Dana Allan I, MD   100 mg at 01/11/18 2010  . levothyroxine (SYNTHROID, LEVOTHROID) tablet 75 mcg  75 mcg Oral Q breakfast Dana Allan I, MD   75 mcg at 01/12/18 0540  . ondansetron (ZOFRAN) injection 4 mg  4 mg Intravenous Q6H PRN Dana Allan I, MD      . oxybutynin (DITROPAN-XL) 24 hr tablet 10 mg  10 mg Oral Daily Dana Allan I, MD      . pantoprazole (PROTONIX) injection 40 mg  40 mg Intravenous Q12H Dana Allan I, MD   40 mg at 01/11/18 2202  . simvastatin (ZOCOR) tablet 40 mg  40 mg Oral QHS Dana Allan I, MD   40 mg at 01/11/18 2203  . traZODone (DESYREL) tablet 50 mg  50 mg Oral QHS Dana Allan I, MD   50 mg at 01/11/18 2202  . triamcinolone cream (KENALOG) 0.1 % 1 application  1 application Topical BID Dana Allan I, MD   1 application at 74/25/95 2211    Allergies as of 01/11/2018 - Review Complete 01/11/2018  Allergen Reaction Noted  .  Other Other (See Comments) 11/18/2011  . Flexeril [cyclobenzaprine] Other (See Comments) 01/05/2018  . Atorvastatin Other  (See Comments) 07/04/2015  . Chlordiazepoxide-clidinium Other (See Comments) 07/04/2015  . Codeine Nausea Only 11/21/2011     Review of Systems:    Constitutional: No weight loss, fever or chills Skin: +rash Cardiovascular: No chest pain  Respiratory: No SOB  Gastrointestinal: See HPI and otherwise negative Genitourinary: No dysuria  Neurological: No headache, dizziness or syncope Musculoskeletal: No new muscle or joint pain Hematologic: No bleeding  Psychiatric: No history of depression or anxiety    Physical Exam:  Vital signs in last 24 hours: Temp:  [97.6 F (36.4 C)-98.3 F (36.8 C)] 97.6 F (36.4 C) (11/25 0411) Pulse Rate:  [66-79] 70 (11/25 0411) Resp:  [16-20] 18 (11/25 0411) BP: (115-170)/(64-88) 115/64 (11/25 0411) SpO2:  [95 %-98 %] 95 % (11/25 0411) Weight:  [69.9 kg] 69.9 kg (11/24 1945) Last BM Date: 01/11/18 General:   Pleasant Caucasian female appears to be in NAD, Well developed, Well nourished, alert and cooperative Head:  Normocephalic and atraumatic. Eyes:   PEERL, EOMI. No icterus. Conjunctiva pink. Ears:  Normal auditory acuity. Neck:  Supple Throat: Oral cavity and pharynx without inflammation, swelling or lesion.  Lungs: Respirations even and unlabored. Lungs clear to auscultation bilaterally.   No wheezes, crackles, or rhonchi.  Heart: Normal S1, S2. No MRG. Regular rate and rhythm. No peripheral edema, cyanosis or pallor.  Abdomen:  Soft, nondistended, nontender. No rebound or guarding. Normal bowel sounds. No appreciable masses or hepatomegaly. Rectal:  Not performed.  Msk:  Symmetrical without gross deformities. Peripheral pulses intact.  Extremities:  Without edema, no deformity or joint abnormality.  Neurologic:  Alert and  oriented x4;  grossly normal neurologically.  Skin:   Dry and intact without significant lesions or rashes. Psychiatric: Demonstrates good judgement and reason without abnormal affect or behaviors.   LAB  RESULTS: Recent Labs    01/11/18 1202 01/12/18 0428  WBC 12.6* 9.4  HGB 13.6 10.4*  HCT 43.6 33.8*  PLT 533* 373   BMET Recent Labs    01/11/18 1202 01/12/18 0428  NA 136 138  K 3.9 3.6  CL 102 109  CO2 23 22  GLUCOSE 152* 156*  BUN 17 12  CREATININE 0.69 0.70  CALCIUM 8.7* 7.7*   LFT Recent Labs    01/11/18 1202  PROT 6.7  ALBUMIN 3.0*  AST 18  ALT 15  ALKPHOS 38  BILITOT 0.3   STUDIES: Ct Abdomen Pelvis W Contrast  Result Date: 01/11/2018 CLINICAL DATA:  Acute abdominal pain, nausea vomiting. Recent lumbar surgery 12/23/2017 EXAM: CT ABDOMEN AND PELVIS WITH CONTRAST TECHNIQUE: Multidetector CT imaging of the abdomen and pelvis was performed using the standard protocol following bolus administration of intravenous contrast. CONTRAST:  159mL ISOVUE-300 IOPAMIDOL (ISOVUE-300) INJECTION 61% COMPARISON:  01/15/2002 FINDINGS: Lower chest: Minor basilar atelectasis, worse on the right. Normal heart size. No pericardial or pleural effusion. Moderate sized hiatal hernia. Hepatobiliary: Slight liver hypoattenuation suggesting mild fatty infiltration. Moderate gallbladder distention. Minimal biliary prominence without obstruction. No focal hepatic abnormality. Hepatic and portal veins are patent. Pancreas: Mild atrophy without ductal dilatation. Spleen: Normal in size without focal abnormality. Adrenals/Urinary Tract: Adrenal glands are unremarkable. Kidneys are normal, without renal calculi, focal lesion, or hydronephrosis. Bladder is unremarkable. Stomach/Bowel: Hiatal hernia again noted. Mid and distal portions of the duodenum demonstrate wall thickening, mucosal enhancement and surrounding retroperitoneal strandy edema compatible with acute duodenitis. No evidence of perforation, pneumatosis,  fluid collection, or abscess. No associated obstruction pattern. Scattered colonic diverticulosis. No other acute inflammatory process. Appendix not visualized. Vascular/Lymphatic: Aortic  atherosclerosis noted. Negative for aneurysm or occlusive disease. No retroperitoneal hemorrhage or hematoma. Mesenteric and renal vasculature have atherosclerotic origins but appear patent. No veno-occlusive process. No adenopathy. Reproductive: Status post hysterectomy. No adnexal masses. Other: No abdominal wall hernia or abnormality. No abdominopelvic ascites. Musculoskeletal: Degenerative changes noted spine. Postop changes from recent lumbar surgery. Small posterior paraspinous and subcutaneous ill-defined fluid collection measuring 3.8 x 1.6 cm, suspect postop hematoma, seroma, less likely abscess. This is at the L4 level. Remote right hip arthroplasty creating artifact across the pelvis. No acute osseous finding. IMPRESSION: CT findings compatible with acute duodenitis of the mid and distal portions of the duodenum as above, usually infectious/inflammatory, unlikely to be ischemic as the mesenteric vasculature appear patent. Difficult to exclude underlying ulcer disease. No associated obstruction, perforation, or abscess. Small hiatal hernia Aortic atherosclerosis without aneurysm Diverticulosis Remote hysterectomy Posterior paraspinous and subcutaneous L4 level fluid collection, suspect postoperative as above. Electronically Signed   By: Jerilynn Mages.  Shick M.D.   On: 01/11/2018 14:42    Impression / Plan:   Impression: 1.  Nausea and vomiting: Uncertain etiology, seems under control on IV Zofran as patient has not vomited since 01/11/2018 a.m., patient still complains of a "coating" in her throat 2.  Abnormal CT of the abdomen: Showing acute duodenitis in the mid and distal portions of the duodenum; consider infectious versus inflammatory 3.  Recent back surgery: 3 weeks ago 4.  Rash: Unclear cause, patient is seeing dermatology  Plan: 1.  Plan for repeat EGD today with Dr. Rush Landmark.  Did discuss risk, benefits, limitations and alternatives and the patient agrees to proceed.  This was scheduled for  around 330 this afternoon. 2.  Patient will be strict n.p.o. until after time of procedure. 3.  Continue other supportive measures including fluids, Protonix and Zofran 4.  Please await further recommendations from Dr. Rush Landmark after time of procedure.  Thank you for your kind consultation, we will continue to follow.  Lavone Nian [facility]  01/12/2018, 8:47 AM

## 2018-01-12 NOTE — Progress Notes (Signed)
PROGRESS NOTE    Cheryl Cooper  NOI:[id number] DOB: 02/04/1941 DOA: 01/11/2018 PCP: Cheryl Agreste, MD   Brief Narrative:  HPI On 01/11/2018 by Dr. Dana Cooper Patient is a 77 year old female, with past medical history significant for newly diagnosed rash of the lower extremities (about 2-week ago), for which patient is said to be followed up by dermatologist, and possibility of vasculitic rash is being considered.  Patient also carries a prior diagnosis of CVA, memory loss, hypothyroidism, hypertension, hyperlipidemia, GERD, diabetes mellitus that is diet controlled, depression, deafness of the left ear, anxiety and carpal tunnel syndrome.  Patient developed significant nausea and vomiting about 2 days ago.  No associated fever or chills, no abdominal pain, no diarrhea no joint pains.  Last vomiting was early this morning.  CBC reveals mild leukocytosis, UA revealed moderate leukocyte and specific gravity that is greater than 1.046.  No headache, no neck pain, no shortness of breath, no chest pain and no urinary symptoms.  Patient be admitted for further assessment and management.  Interim history Admitted for nausea and vomiting. Found to have duodenitis on CT scan. GI consulted and planning for EGD today.  Assessment & Plan   Acute duodenitis with nausea and vomiting -?unknown etiology  -CT abdomen/pelvis: Acute duodenitis of the mid and distal portions of the duodenum.  Difficult to exclude underlying ulcer disease. -Continue antiemetics, IVF -Currently NPO -Gastroenterology consulted and appreciated, planning for EGD later today -Was with gastroenterologist in [place].  States that she was supposed to be on Nexium 40 mg twice daily however when she stopped taking the night dose, her acid reflux improved.  ?Thrush -none seen, but states she was recently on magic mouthwash -feels "vaseline is coating the back of her throat" -?post nasal drip, started flonase  Recent  back surgery -Occurred 3 weeks ago -Continue pain control -Will order K-pad  Rash -Unknown etiology, currently seeing a dermatologist. Was placed on ampicillin  -Suspect patient will need biopsy as an outpatient  Essential hypertension -Continue amlodipine  Diabetes mellitus, type II -On no home medications, appears to be diet controlled -Hemoglobin A1c 6.6  Hyperlipidemia -Continue Statin  DVT Prophylaxis  Lovenox  Code Status: Full  Family Communication: none at bedside  Disposition Plan: observation- pending EGD today and further recommendations from GI  Consultants Gastroenterology  Procedures  None  Antibiotics   Anti-infectives (From admission, onward)   Start     Dose/Rate Route Frequency Ordered Stop   01/11/18 2200  ampicillin (PRINCIPEN) capsule 500 mg     500 mg Oral 2 times daily 01/11/18 1840        Subjective:   Cheryl Cooper seen and examined today.  Has had no further nausea or vomiting today.  Denies current abdominal pain.  States she feels there is Vaseline coating the back of her throat.  Denies current chest pain, shortness of breath, diarrhea constipation, dizziness or headache.  Does complain of back discomfort as she recently had surgery.  Objective:   Vitals:   01/11/18 1840 01/11/18 1945 01/12/18 0411 01/12/18 0918  BP: (!) 153/68  115/64 136/73  Pulse: 66  70 72  Resp: 18  18   Temp: 98.3 F (36.8 C)  97.6 F (36.4 C)   TempSrc: Oral  Oral   SpO2: 95%  95%   Weight:  69.9 kg    Height:  5\' 3"  (1.6 m)      Intake/Output Summary (Last 24 hours) at 01/12/2018 1251 Last data filed at  01/12/2018 1000 Gross per 24 hour  Intake 3421.83 ml  Output 1350 ml  Net 2071.83 ml   Filed Weights   01/11/18 1945  Weight: 69.9 kg    Exam  General: Well developed, well nourished, NAD, appears stated age  59: NCAT, mucous membranes moist.   Neck: Supple  Cardiovascular: S1 S2 auscultated, RRR, no murmur  Respiratory: Clear to  auscultation bilaterally with equal chest rise  Abdomen: Soft, nontender, nondistended, + bowel sounds  Extremities: warm dry without cyanosis clubbing or edema  Neuro: AAOx3, nonfocal  Skin: Rash, vasculitic on lower ext extending to abdomen  Psych: Normal affect and demeanor with intact judgement and insight   Data Reviewed: I have personally reviewed following labs and imaging studies  CBC: Recent Labs  Lab 01/05/18 1431 01/11/18 1202 01/12/18 0428  WBC 5.9 12.6* 9.4  NEUTROABS 3.3  --   --   HGB 10.9* 13.6 10.4*  HCT 36.3 43.6 33.8*  MCV 85.8 83.0 85.8  PLT 370 533* 893   Basic Metabolic Panel: Recent Labs  Lab 01/05/18 1431 01/11/18 1202 01/11/18 1910 01/12/18 0428  NA 138 136  --  138  K 3.7 3.9  --  3.6  CL 104 102  --  109  CO2 27 23  --  22  GLUCOSE 113* 152*  --  156*  BUN 8 17  --  12  CREATININE 0.86 0.69  --  0.70  CALCIUM 8.5* 8.7*  --  7.7*  MG  --   --  2.0 2.1  PHOS  --   --  1.8*  --    GFR: Estimated Creatinine Clearance: 57 mL/min (by C-G formula based on SCr of 0.7 mg/dL). Liver Function Tests: Recent Labs  Lab 01/05/18 1431 01/11/18 1202  AST 32 18  ALT 24 15  ALKPHOS 46 38  BILITOT 0.6 0.3  PROT 6.3* 6.7  ALBUMIN 2.9* 3.0*   Recent Labs  Lab 01/11/18 1202  LIPASE 20   No results for input(s): AMMONIA in the last 168 hours. Coagulation Profile: No results for input(s): INR, PROTIME in the last 168 hours. Cardiac Enzymes: No results for input(s): CKTOTAL, CKMB, CKMBINDEX, TROPONINI in the last 168 hours. BNP (last 3 results) No results for input(s): PROBNP in the last 8760 hours. HbA1C: Recent Labs    01/11/18 1910  HGBA1C 6.6*   CBG: Recent Labs  Lab 01/11/18 2212 01/12/18 0800 01/12/18 1147  GLUCAP 171* 125* 103*   Lipid Profile: No results for input(s): CHOL, HDL, LDLCALC, TRIG, CHOLHDL, LDLDIRECT in the last 72 hours. Thyroid Function Tests: Recent Labs    01/11/18 1910  TSH 4.032   Anemia  Panel: No results for input(s): VITAMINB12, FOLATE, FERRITIN, TIBC, IRON, RETICCTPCT in the last 72 hours. Urine analysis:    Component Value Date/Time   COLORURINE YELLOW 01/11/2018 [place] 01/11/2018 1422   LABSPEC >1.046 (H) 01/11/2018 1422   PHURINE 7.0 01/11/2018 1422   GLUCOSEU NEGATIVE 01/11/2018 1422   HGBUR NEGATIVE 01/11/2018 1422   BILIRUBINUR NEGATIVE 01/11/2018 1422   KETONESUR 20 (A) 01/11/2018 1422   PROTEINUR NEGATIVE 01/11/2018 1422   UROBILINOGEN 0.2 12/12/2008 1020   NITRITE NEGATIVE 01/11/2018 1422   LEUKOCYTESUR MODERATE (A) 01/11/2018 1422   Sepsis Labs: @LABRCNTIP (procalcitonin:4,lacticidven:4)  ) Recent Results (from the past 240 hour(s))  Blood culture (routine x 2)     Status: None   Collection Time: 01/05/18  3:56 PM  Result Value Ref Range Status  Specimen Description   Final    RIGHT ANTECUBITAL Performed at [facility] [address], [place], [place] [address]    Special Requests   Final    BOTTLES DRAWN AEROBIC AND ANAEROBIC Blood Culture adequate volume Performed at [facility] [address], [place], [place] [address]    Culture   Final    NO GROWTH 5 DAYS Performed at [facility], [place] [address] [place], [place] [address]    Report Status 01/10/2018 FINAL  Final  Blood culture (routine x 2)     Status: Abnormal   Collection Time: 01/05/18  3:56 PM  Result Value Ref Range Status   Specimen Description   Final    LEFT ANTECUBITAL Performed at [facility] [address] [place], [place] [address]    Special Requests   Final    BOTTLES DRAWN AEROBIC AND ANAEROBIC Blood Culture adequate volume Performed at [facility] [address], [place], [place] [address]    Culture  Setup Time   Final    GRAM POSITIVE COCCI IN CHAINS ANAEROBIC BOTTLE ONLY Organism ID to follow CRITICAL RESULT CALLED TO, READ BACK BY AND VERIFIED  WITH: Evalyn Casco  RN 14:45 01/06/18 ([email or web address]) Performed at [facility], [place] [address], [place], [place] [address]    Culture VIRIDANS STREPTOCOCCUS (A)  Final   Report Status 01/08/2018 FINAL  Final  Blood Culture ID Panel (Reflexed)     Status: Abnormal   Collection Time: 01/05/18  3:56 PM  Result Value Ref Range Status   Enterococcus species NOT DETECTED NOT DETECTED Final   Listeria monocytogenes NOT DETECTED NOT DETECTED Final   Staphylococcus species NOT DETECTED NOT DETECTED Final   Staphylococcus aureus (BCID) NOT DETECTED NOT DETECTED Final   Streptococcus species DETECTED (A) NOT DETECTED Final    Comment: Not Enterococcus species, Streptococcus agalactiae, Streptococcus pyogenes, or Streptococcus pneumoniae. CRITICAL RESULT CALLED TO, READ BACK BY AND VERIFIED WITH: Evalyn Casco RN 14:45 01/06/18 ([email or web address])    Streptococcus agalactiae NOT DETECTED NOT DETECTED Final   Streptococcus pneumoniae NOT DETECTED NOT DETECTED Final   Streptococcus pyogenes NOT DETECTED NOT DETECTED Final   Acinetobacter baumannii NOT DETECTED NOT DETECTED Final   Enterobacteriaceae species NOT DETECTED NOT DETECTED Final   Enterobacter cloacae complex NOT DETECTED NOT DETECTED Final   Escherichia coli NOT DETECTED NOT DETECTED Final   Klebsiella oxytoca NOT DETECTED NOT DETECTED Final   Klebsiella pneumoniae NOT DETECTED NOT DETECTED Final   Proteus species NOT DETECTED NOT DETECTED Final   Serratia marcescens NOT DETECTED NOT DETECTED Final   Haemophilus influenzae NOT DETECTED NOT DETECTED Final   Neisseria meningitidis NOT DETECTED NOT DETECTED Final   Pseudomonas aeruginosa NOT DETECTED NOT DETECTED Final   Candida albicans NOT DETECTED NOT DETECTED Final   Candida glabrata NOT DETECTED NOT DETECTED Final   Candida krusei NOT DETECTED NOT DETECTED Final   Candida parapsilosis NOT DETECTED NOT DETECTED Final   Candida tropicalis NOT DETECTED NOT DETECTED Final    Comment: Performed at  [facility], [place] [address] [place],  [address]      Radiology Studies: Ct Abdomen Pelvis W Contrast  Result Date: 01/11/2018 CLINICAL DATA:  Acute abdominal pain, nausea vomiting. Recent lumbar surgery 12/23/2017 EXAM: CT ABDOMEN AND PELVIS WITH CONTRAST TECHNIQUE: Multidetector CT imaging of the abdomen and pelvis was performed using the standard protocol following bolus administration of intravenous contrast. CONTRAST:  129mL ISOVUE-300 IOPAMIDOL (ISOVUE-300) INJECTION 61% COMPARISON:  01/15/2002 FINDINGS: Lower chest: Minor basilar atelectasis, worse on the right. Normal heart size. No pericardial or pleural effusion. Moderate sized hiatal hernia. Hepatobiliary: Slight liver hypoattenuation suggesting mild fatty infiltration. Moderate gallbladder distention. Minimal biliary prominence without obstruction. No focal hepatic abnormality. Hepatic and portal veins are patent. Pancreas: Mild atrophy without ductal dilatation. Spleen: Normal in size without focal abnormality. Adrenals/Urinary Tract: Adrenal glands are unremarkable. Kidneys are normal, without renal calculi, focal lesion, or hydronephrosis. Bladder is unremarkable. Stomach/Bowel: Hiatal hernia again noted. Mid and distal portions of the duodenum demonstrate wall thickening, mucosal enhancement and surrounding retroperitoneal strandy edema compatible with acute duodenitis. No evidence of perforation, pneumatosis, fluid collection, or abscess. No associated obstruction pattern. Scattered colonic diverticulosis. No other acute inflammatory process. Appendix not visualized. Vascular/Lymphatic: Aortic atherosclerosis noted. Negative for aneurysm or occlusive disease. No retroperitoneal hemorrhage or hematoma. Mesenteric and renal vasculature have atherosclerotic origins but appear patent. No veno-occlusive process. No adenopathy. Reproductive: Status post hysterectomy. No adnexal masses. Other: No abdominal wall hernia or abnormality.  No abdominopelvic ascites. Musculoskeletal: Degenerative changes noted spine. Postop changes from recent lumbar surgery. Small posterior paraspinous and subcutaneous ill-defined fluid collection measuring 3.8 x 1.6 cm, suspect postop hematoma, seroma, less likely abscess. This is at the L4 level. Remote right hip arthroplasty creating artifact across the pelvis. No acute osseous finding. IMPRESSION: CT findings compatible with acute duodenitis of the mid and distal portions of the duodenum as above, usually infectious/inflammatory, unlikely to be ischemic as the mesenteric vasculature appear patent. Difficult to exclude underlying ulcer disease. No associated obstruction, perforation, or abscess. Small hiatal hernia Aortic atherosclerosis without aneurysm Diverticulosis Remote hysterectomy Posterior paraspinous and subcutaneous L4 level fluid collection, suspect postoperative as above. Electronically Signed   By: Jerilynn Mages.  Shick M.D.   On: 01/11/2018 14:42     Scheduled Meds: . amLODipine  5 mg Oral Daily  . ampicillin  500 mg Oral BID  . [START ON 01/14/2018] aspirin  81 mg Oral Once per day on Sun Wed  . calcium-vitamin D   Oral Daily  . enoxaparin (LOVENOX) injection  40 mg Subcutaneous Q24H  . escitalopram  10 mg Oral Daily  . ezetimibe  10 mg Oral Daily  . fluticasone  1 spray Each Nare BID  . insulin aspart  0-9 Units Subcutaneous TID WC  . lamoTRIgine  100 mg Oral Daily  . levothyroxine  75 mcg Oral Q breakfast  . oxybutynin  10 mg Oral Daily  . pantoprazole (PROTONIX) IV  40 mg Intravenous Q12H  . simvastatin  40 mg Oral QHS  . traZODone  50 mg Oral QHS  . triamcinolone cream  1 application Topical BID   Continuous Infusions: . sodium chloride 100 mL/hr at 01/12/18 0541     LOS: 0 days   Time Spent in minutes   45 minutes (greater than 50% of time spent with patient face to face, as well as reviewing old records, calling consults, and formulating a plan)   Jaidin Ugarte D.O. on  01/12/2018 at 12:51 PM  Between 7am to 7pm - Please see pager noted on [email or web address]  After 7pm go to [email or web address]  And look for the night coverage person covering for me after hours  [facility] Office  [phone]

## 2018-01-13 DIAGNOSIS — R935 Abnormal findings on diagnostic imaging of other abdominal regions, including retroperitoneum: Secondary | ICD-10-CM | POA: Diagnosis not present

## 2018-01-13 DIAGNOSIS — Z9889 Other specified postprocedural states: Secondary | ICD-10-CM

## 2018-01-13 DIAGNOSIS — R1011 Right upper quadrant pain: Secondary | ICD-10-CM | POA: Diagnosis not present

## 2018-01-13 DIAGNOSIS — R112 Nausea with vomiting, unspecified: Secondary | ICD-10-CM | POA: Diagnosis not present

## 2018-01-13 DIAGNOSIS — K2980 Duodenitis without bleeding: Secondary | ICD-10-CM | POA: Diagnosis not present

## 2018-01-13 DIAGNOSIS — R21 Rash and other nonspecific skin eruption: Secondary | ICD-10-CM | POA: Diagnosis not present

## 2018-01-13 LAB — URINE CULTURE: Culture: <10000 — AB

## 2018-01-13 LAB — HEMOGLOBIN AND HEMATOCRIT, BLOOD
HCT: 35.6 % — ABNORMAL LOW (ref 36.0–46.0)
HEMOGLOBIN: 10.8 g/dL — AB (ref 12.0–15.0)

## 2018-01-13 LAB — GLUCOSE, CAPILLARY: Glucose-Capillary: 105 mg/dL — ABNORMAL HIGH (ref 70–99)

## 2018-01-13 LAB — ANTINUCLEAR ANTIBODIES, IFA: ANA Ab, IFA: NEGATIVE

## 2018-01-13 MED ORDER — SIMETHICONE 80 MG PO CHEW
80.0000 mg | CHEWABLE_TABLET | Freq: Four times a day (QID) | ORAL | Status: DC | PRN
  Administered 2018-01-13: 80 mg via ORAL
  Filled 2018-01-13: qty 1

## 2018-01-13 MED ORDER — LIP MEDEX EX OINT
TOPICAL_OINTMENT | CUTANEOUS | Status: AC
  Filled 2018-01-13: qty 7

## 2018-01-13 MED ORDER — ACETAMINOPHEN 325 MG PO TABS
650.0000 mg | ORAL_TABLET | Freq: Four times a day (QID) | ORAL | Status: DC | PRN
  Administered 2018-01-13: 650 mg via ORAL
  Filled 2018-01-13: qty 2

## 2018-01-13 MED ORDER — ESOMEPRAZOLE MAGNESIUM 40 MG PO CPDR: 40.0000 mg | DELAYED_RELEASE_CAPSULE | Freq: Two times a day (BID) | ORAL | 0 refills | Status: DC

## 2018-01-13 NOTE — Plan of Care (Signed)
Reviewed discharge instructions with patient; copy given. IV removed. Patient ready for discharge.  

## 2018-01-13 NOTE — Discharge Instructions (Signed)
Duodenitis °Duodenitis is inflammation of the lining of the first part of the small intestine (duodenum). It is commonly caused by a bacterial infection, which may also lead to open sores (ulcers) in the intestine. °Duodenitis may develop suddenly and last for a short time (acute) or it may develop gradually and last for months or years (chronic). °What are the causes? °The most common cause of duodenitis is an infection from H. pylori bacteria. Other causes of this condition include: °· Long-term use of NSAIDs. °· Excessive use of alcohol. °· An infection of the small intestine (giardiasis). °· Crohn’s disease. °· Certain diseases of the immune system. °· Certain treatments for cancer. ° °What increases the risk? ° °The following factors may make you more likely to develop duodenitis: °· Smoking cigarettes. °· Drinking alcohol. °· Having a family history of duodenitis. °· Taking NSAIDs. °· Eating a high-fat diet. ° °What are the signs or symptoms? °Symptoms of this condition may include: °· Gnawing or burning pain in the upper center of the abdomen (epigastric pain). This may get worse when the stomach is empty and may get better after eating. °· Abdominal cramps. °· Nausea and vomiting. °· Bloody vomit. °· Stools that are bloody, dark, or look like tar. °· Diarrhea. °· Weight loss. °· Fatigue. ° °How is this diagnosed? °This condition may be diagnosed based on your medical history and a physical exam. You may also have tests, such as: °· Blood tests. °· Stool tests. °· A test that checks the gases in your breath. °· An X-ray that is done after you swallow a liquid (barium) that makes your digestive tract easier to see. °· Endoscopy. This is an exam of the duodenum that is done by putting a thin tube with a tiny camera on the end down your throat (endoscopy). A sample of tissue from your duodenum (biopsy) may be removed with the endoscope and examined under a microscope for signs of inflammation and  infection. ° °How is this treated? °Treatment depends on the cause of your condition. Treatment may include: °· Antibiotic medicine to treat H. pylori infection. °· Stopping your intake of NSAIDs. °· Medicine to reduce stomach acids. °· Medicine to treat Crohn’s disease. °· Surgery to treat severe inflammation that causes scarring or severe bleeding. ° °Follow these instructions at home: °Medicines °· Take over-the-counter and prescription medicines only as told by your health care provider. °· If you were prescribed antibiotic medicine, take it as told by your health care provider. Do not stop taking the antibiotic even if you start to feel better. °Eating and drinking ° °· Eat small, frequent meals. °· Do not drink alcohol. °· Drink enough water to keep your urine clear or pale yellow. °· Follow instructions from your health care provider about eating or drinking restrictions. You may be asked to avoid: °? Caffeinated drinks. °? Chocolate. °? Peppermint or mint-flavored food or drinks. °? Garlic. °? Onions. °? Spicy foods. °? Citrus fruits. °? Tomato-based foods. °? Fatty foods. °? Fried foods. °Contact a health care provider if: °· You have a fever. °· Your symptoms come back, get worse, or do not get better with treatment. °Get help right away if: °· You vomit blood. °· You have severe abdominal pain. °· Your abdomen swells and is painful. °· You have a lot of blood in your stool. °· You feel dizzy or light-headed. °This information is not intended to replace advice given to you by your health care provider. Make sure you discuss   any questions you have with your health care provider. °Document Released: 06/01/2012 Document Revised: 07/13/2015 Document Reviewed: 12/08/2014 °[employer] Interactive Patient Education © 2018 [employer] ° °

## 2018-01-13 NOTE — Discharge Summary (Signed)
Physician Discharge Summary  Cheryl Cooper PZW:[id number] DOB: September 06, 1940 DOA: 01/11/2018  PCP: Wendie Agreste, MD  Admit date: 01/11/2018 Discharge date: 01/13/2018  Time spent: 45 minutes  Recommendations for Outpatient Follow-up:  Patient will be discharged to home.  Patient will need to follow up with primary care provider within one week of discharge, repeat CBC.  Follow up with gastroenterology.  Patient should continue medications as prescribed.  Patient should follow a heart healthy/carb modified diet.   Discharge Diagnoses:  Acute duodenitis with nausea and vomiting ?Thrush Recent back surgery Rash Essential hypertension Diabetes mellitus, type II Hyperlipidemia  Discharge Condition: Stable  Diet recommendation: Heart healthy/carb modified  Filed Weights   01/11/18 1945 01/12/18 1505  Weight: 69.9 kg 69.9 kg    History of present illness:  On 01/11/2018 by Dr. Dana Allan Patient is a 77 year old female, with past medical history significant for newly diagnosed rash of the lower extremities(about 2-week ago), for which patient is said to be followed up by dermatologist, and possibility of vasculitic rash is being considered. Patient also carries a prior diagnosis of CVA, memory loss, hypothyroidism, hypertension, hyperlipidemia, GERD, diabetes mellitus that is diet controlled, depression, deafness of the left ear, anxiety and carpal tunnel syndrome.Patient developed significant nausea and vomiting about 2 days ago. No associated fever or chills, no abdominal pain, no diarrhea no joint pains. Last vomiting was early this morning. CBC reveals mild leukocytosis, UA revealed moderate leukocyte and specific gravity that is greater than 1.046. No headache, no neck pain, no shortness of breath, no chest pain and no urinary symptoms. Patient be admitted for further assessment and management.  Hospital Course:  Acute duodenitis with nausea and vomiting -?unknown  etiology  -Resolved -CT abdomen/pelvis: Acute duodenitis of the mid and distal portions of the duodenum.  Difficult to exclude underlying ulcer disease. -Was with gastroenterologist in [place].  States that she was supposed to be on Nexium 40 mg twice daily however when she stopped taking the night dose, her acid reflux improved. -Gastroenterology consulted and appreciated, status post EGD showing white nummular lesions and esophageal mucosa, biopsied.  LA grade C esophagitis.  Hiatal hernia.  Multiple nonbleeding duodenal ulcers with a clean ulcer base (force class III). -Pending biopsy results -Gastrin level ordered, pending -Gastroenterology recommended PPI twice daily, follow-up EGD in approximately 3 months.  Biopsy results and gastrin level will be followed up on as an outpatient  ?Thrush -none seen, but states she was recently on magic mouthwash -feels "vaseline is coating the back of her throat" -?post nasal drip, started flonase  Recent back surgery -Occurred 3 weeks ago -Continue pain control  Rash -Unknown etiology, currently seeing a dermatologist. Was placed on ampicillin  -Suspect patient will need biopsy as an outpatient  Essential hypertension -Continue amlodipine  Diabetes mellitus, type II -On no home medications, appears to be diet controlled -Hemoglobin A1c 6.6  Hyperlipidemia -Continue Statin  Normocytic anemia -Hemoglobin dropped to 10.8, suspect this was due to IV fluids (dilutional component) -Would repeat CBC in 1 week  Procedures: EGD  Consultations: Gastroenterology  Discharge Exam: Vitals:   01/12/18 2149 01/13/18 0520  BP: 128/67 (!) 142/65  Pulse: 68 66  Resp: 18 18  Temp: (!) 97.5 F (36.4 C) 98.1 F (36.7 C)  SpO2: 94% 92%   Patient feeling better today. No further episodes of nausea or vomiting. Feels bloated. Denies current chest pain, shortness of breath, abdominal pain, dizziness, headache.    General: Well  developed, well nourished,  NAD, appears stated age  62: NCAT, mucous membranes moist.  Cardiovascular: S1 S2 auscultated, RRR, no murmur  Respiratory: Clear to auscultation bilaterally with equal chest rise  Abdomen: Soft, nontender, nondistended, + bowel sounds  Extremities: warm dry without cyanosis clubbing or edema  Neuro: AAOx3, nonfocal  Psych: Normal affect and demeanor, pleasant   Discharge Instructions Discharge Instructions    Discharge instructions   Complete by:  As directed    Patient will be discharged to home.  Patient will need to follow up with primary care provider within one week of discharge, repeat CBC.  Follow up with gastroenterology.  Patient should continue medications as prescribed.  Patient should follow a heart healthy/carb modified diet.     Allergies as of 01/13/2018      Reactions   Other Other (See Comments)   Tomato sauce, garlic, onion - severe acid reflux    Flexeril [cyclobenzaprine] Other (See Comments)   Per spouse "she felt like a zombie"   Atorvastatin Other (See Comments)   Unbalanced   Chlordiazepoxide-clidinium Other (See Comments)   Dizziness (intolerance)   Codeine Nausea Only      Medication List    STOP taking these medications   naproxen sodium 220 MG tablet Commonly known as:  ALEVE     TAKE these medications   alendronate 70 MG tablet Commonly known as:  FOSAMAX Take 70 mg by mouth every Monday. Take with a full glass of water on an empty stomach.   amLODipine 5 MG tablet Commonly known as:  NORVASC Take 5 mg by mouth daily.   ampicillin 500 MG capsule Commonly known as:  PRINCIPEN Take 500 mg by mouth 2 (two) times daily.   aspirin 81 MG chewable tablet Chew 1 tablet (81 mg total) by mouth 2 (two) times daily. What changed:  when to take this   CALCIUM-D PO Take 2 tablets by mouth daily.   escitalopram 10 MG tablet Commonly known as:  LEXAPRO Take 10 mg by mouth daily.   esomeprazole 40 MG  capsule Commonly known as:  NEXIUM Take 1 capsule (40 mg total) by mouth 2 (two) times daily before a meal. What changed:  when to take this   ezetimibe 10 MG tablet Commonly known as:  ZETIA Take 1 tablet (10 mg total) by mouth daily.   hydrOXYzine 10 MG tablet Commonly known as:  ATARAX/VISTARIL Take 1 tablet (10 mg total) by mouth at bedtime as needed for anxiety (and sleep.).   lamoTRIgine 100 MG tablet Commonly known as:  LAMICTAL Take 100 mg by mouth daily.   levothyroxine 75 MCG tablet Commonly known as:  SYNTHROID, LEVOTHROID Take 75 mcg by mouth daily with breakfast.   oxybutynin 10 MG 24 hr tablet Commonly known as:  DITROPAN-XL Take 1 tablet (10 mg total) by mouth daily.   simvastatin 40 MG tablet Commonly known as:  ZOCOR Take 1 tablet (40 mg total) by mouth at bedtime.   SUPER B COMPLEX/C PO Take 1 tablet by mouth daily.   traZODone 50 MG tablet Commonly known as:  DESYREL Take 50 mg by mouth at bedtime.   triamcinolone cream 0.1 % Commonly known as:  KENALOG Apply 1 application topically 2 (two) times daily.      Allergies  Allergen Reactions  . Other Other (See Comments)    Tomato sauce, garlic, onion - severe acid reflux   . Flexeril [Cyclobenzaprine] Other (See Comments)    Per spouse "she felt like a zombie"  .  Atorvastatin Other (See Comments)    Unbalanced  . Chlordiazepoxide-Clidinium Other (See Comments)    Dizziness (intolerance)  . Codeine Nausea Only   Follow-up Information    Wendie Agreste, MD.   Specialties:  Family Medicine, Sports Medicine Contact information: [place] [address] [phone]        Mansouraty, Telford Nab., MD. Schedule an appointment as soon as possible for a visit in 1 month(s).   Specialties:  Gastroenterology, Internal Medicine Why:  Hospital follow up Contact information: [place] [address] [phone]            The results of significant  diagnostics from this hospitalization (including imaging, microbiology, ancillary and laboratory) are listed below for reference.    Significant Diagnostic Studies: Ct Abdomen Pelvis W Contrast  Result Date: 01/11/2018 CLINICAL DATA:  Acute abdominal pain, nausea vomiting. Recent lumbar surgery 12/23/2017 EXAM: CT ABDOMEN AND PELVIS WITH CONTRAST TECHNIQUE: Multidetector CT imaging of the abdomen and pelvis was performed using the standard protocol following bolus administration of intravenous contrast. CONTRAST:  173mL ISOVUE-300 IOPAMIDOL (ISOVUE-300) INJECTION 61% COMPARISON:  01/15/2002 FINDINGS: Lower chest: Minor basilar atelectasis, worse on the right. Normal heart size. No pericardial or pleural effusion. Moderate sized hiatal hernia. Hepatobiliary: Slight liver hypoattenuation suggesting mild fatty infiltration. Moderate gallbladder distention. Minimal biliary prominence without obstruction. No focal hepatic abnormality. Hepatic and portal veins are patent. Pancreas: Mild atrophy without ductal dilatation. Spleen: Normal in size without focal abnormality. Adrenals/Urinary Tract: Adrenal glands are unremarkable. Kidneys are normal, without renal calculi, focal lesion, or hydronephrosis. Bladder is unremarkable. Stomach/Bowel: Hiatal hernia again noted. Mid and distal portions of the duodenum demonstrate wall thickening, mucosal enhancement and surrounding retroperitoneal strandy edema compatible with acute duodenitis. No evidence of perforation, pneumatosis, fluid collection, or abscess. No associated obstruction pattern. Scattered colonic diverticulosis. No other acute inflammatory process. Appendix not visualized. Vascular/Lymphatic: Aortic atherosclerosis noted. Negative for aneurysm or occlusive disease. No retroperitoneal hemorrhage or hematoma. Mesenteric and renal vasculature have atherosclerotic origins but appear patent. No veno-occlusive process. No adenopathy. Reproductive: Status post  hysterectomy. No adnexal masses. Other: No abdominal wall hernia or abnormality. No abdominopelvic ascites. Musculoskeletal: Degenerative changes noted spine. Postop changes from recent lumbar surgery. Small posterior paraspinous and subcutaneous ill-defined fluid collection measuring 3.8 x 1.6 cm, suspect postop hematoma, seroma, less likely abscess. This is at the L4 level. Remote right hip arthroplasty creating artifact across the pelvis. No acute osseous finding. IMPRESSION: CT findings compatible with acute duodenitis of the mid and distal portions of the duodenum as above, usually infectious/inflammatory, unlikely to be ischemic as the mesenteric vasculature appear patent. Difficult to exclude underlying ulcer disease. No associated obstruction, perforation, or abscess. Small hiatal hernia Aortic atherosclerosis without aneurysm Diverticulosis Remote hysterectomy Posterior paraspinous and subcutaneous L4 level fluid collection, suspect postoperative as above. Electronically Signed   By: Jerilynn Mages.  Shick M.D.   On: 01/11/2018 14:42   Dg Lumbar Spine 1 View  Result Date: 12/23/2017 CLINICAL DATA:  L3-4 laminectomy EXAM: LUMBAR SPINE - 1 VIEW COMPARISON:  MR lumbar spine of 10/08/2008, no more recent evaluation of the lumbar spine is present currently. FINDINGS: Compared to the MR lumbar spine from 2010, using the same numbering system, the instrument for localization is directed toward the L4-5 interspace. Degenerative disc disease is noted at L5-S1. There is some narrowing of the L3-4 disc space as well. IMPRESSION: 1. Instrument posteriorly directed toward the L4-5 interspace. 2. Degenerative disc disease at L5-S1 and to  a lesser degree at L3-4. Electronically Signed   By: Ivar Drape M.D.   On: 12/23/2017 09:05    Microbiology: Recent Results (from the past 240 hour(s))  Blood culture (routine x 2)     Status: None   Collection Time: 01/05/18  3:56 PM  Result Value Ref Range Status   Specimen Description    Final    RIGHT ANTECUBITAL Performed at [facility], [place] [address] [place], [place] [address]    Special Requests   Final    BOTTLES DRAWN AEROBIC AND ANAEROBIC Blood Culture adequate volume Performed at [place] [address] [place], [place] [address]    Culture   Final    NO GROWTH 5 DAYS Performed at [facility], [place] [address] [place], [place] [address]    Report Status 01/10/2018 FINAL  Final  Blood culture (routine x 2)     Status: Abnormal   Collection Time: 01/05/18  3:56 PM  Result Value Ref Range Status   Specimen Description   Final    LEFT ANTECUBITAL Performed at [place] [address], [place], [place] [address]    Special Requests   Final    BOTTLES DRAWN AEROBIC AND ANAEROBIC Blood Culture adequate volume Performed at [place] [address] [place], [place] [address]    Culture  Setup Time   Final    GRAM POSITIVE COCCI IN CHAINS ANAEROBIC BOTTLE ONLY Organism ID to follow CRITICAL RESULT CALLED TO, READ BACK BY AND VERIFIED WITH: Evalyn Casco  RN 14:45 01/06/18 ([email or web address]) Performed at [facility], [place] [address], [place], [place] [address]    Culture VIRIDANS STREPTOCOCCUS (A)  Final   Report Status 01/08/2018 FINAL  Final  Blood Culture ID Panel (Reflexed)     Status: Abnormal   Collection Time: 01/05/18  3:56 PM  Result Value Ref Range Status   Enterococcus species NOT DETECTED NOT DETECTED Final   Listeria monocytogenes NOT DETECTED NOT DETECTED Final   Staphylococcus species NOT DETECTED NOT DETECTED Final   Staphylococcus aureus (BCID) NOT DETECTED NOT DETECTED Final   Streptococcus species DETECTED (A) NOT DETECTED Final    Comment: Not Enterococcus species, Streptococcus agalactiae, Streptococcus pyogenes, or Streptococcus pneumoniae. CRITICAL RESULT CALLED TO, READ BACK BY AND VERIFIED WITH: Evalyn Casco RN 14:45 01/06/18 ([email or web address])     Streptococcus agalactiae NOT DETECTED NOT DETECTED Final   Streptococcus pneumoniae NOT DETECTED NOT DETECTED Final   Streptococcus pyogenes NOT DETECTED NOT DETECTED Final   Acinetobacter baumannii NOT DETECTED NOT DETECTED Final   Enterobacteriaceae species NOT DETECTED NOT DETECTED Final   Enterobacter cloacae complex NOT DETECTED NOT DETECTED Final   Escherichia coli NOT DETECTED NOT DETECTED Final   Klebsiella oxytoca NOT DETECTED NOT DETECTED Final   Klebsiella pneumoniae NOT DETECTED NOT DETECTED Final   Proteus species NOT DETECTED NOT DETECTED Final   Serratia marcescens NOT DETECTED NOT DETECTED Final   Haemophilus influenzae NOT DETECTED NOT DETECTED Final   Neisseria meningitidis NOT DETECTED NOT DETECTED Final   Pseudomonas aeruginosa NOT DETECTED NOT DETECTED Final   Candida albicans NOT DETECTED NOT DETECTED Final   Candida glabrata NOT DETECTED NOT DETECTED Final   Candida krusei NOT DETECTED NOT DETECTED Final   Candida parapsilosis NOT DETECTED NOT DETECTED Final   Candida tropicalis NOT DETECTED NOT DETECTED Final    Comment: Performed at [facility], [place] [address], [place], [place] [address]  Urine culture     Status: Abnormal   Collection Time: 01/11/18  6:41 PM  Result Value Ref Range Status   Specimen Description   Final    URINE, CLEAN CATCH Performed at [facility], [place] [address] [place], [place] [address]    Special Requests   Final    NONE Performed at [facility], [place] [address], [place], [place] [address]    Culture (A)  Final    <10,000 COLONIES/mL INSIGNIFICANT GROWTH Performed at [place] [address], [place], [place] [address]    Report Status 01/13/2018 FINAL  Final     Labs: Basic Metabolic Panel: Recent Labs  Lab 01/11/18 1202 01/11/18 1910 01/12/18 0428  NA 136  --  138  K 3.9  --  3.6  CL 102  --  109  CO2 23  --  22  GLUCOSE 152*  --  156*  BUN 17  --  12   CREATININE 0.69  --  0.70  CALCIUM 8.7*  --  7.7*  MG  --  2.0 2.1  PHOS  --  1.8*  --    Liver Function Tests: Recent Labs  Lab 01/11/18 1202  AST 18  ALT 15  ALKPHOS 38  BILITOT 0.3  PROT 6.7  ALBUMIN 3.0*   Recent Labs  Lab 01/11/18 1202  LIPASE 20   No results for input(s): AMMONIA in the last 168 hours. CBC: Recent Labs  Lab 01/11/18 1202 01/12/18 0428 01/13/18 0723  WBC 12.6* 9.4  --   HGB 13.6 10.4* 10.8*  HCT 43.6 33.8* 35.6*  MCV 83.0 85.8  --   PLT 533* 373  --    Cardiac Enzymes: No results for input(s): CKTOTAL, CKMB, CKMBINDEX, TROPONINI in the last 168 hours. BNP: BNP (last 3 results) No results for input(s): BNP in the last 8760 hours.  ProBNP (last 3 results) No results for input(s): PROBNP in the last 8760 hours.  CBG: Recent Labs  Lab 01/11/18 2212 01/12/18 0800 01/12/18 1147 01/12/18 2144 01/13/18 0737  GLUCAP 171* 125* 103* 102* 105*       Signed:  Delta Deshmukh  [facility] 01/13/2018, 10:42 AM

## 2018-01-14 ENCOUNTER — Encounter (HOSPITAL_COMMUNITY): Payer: Self-pay | Admitting: Gastroenterology

## 2018-01-14 LAB — GASTRIN: Gastrin: 98 pg/mL (ref 0–115)

## 2018-01-15 ENCOUNTER — Encounter: Payer: Self-pay | Admitting: Gastroenterology

## 2018-01-20 ENCOUNTER — Other Ambulatory Visit: Payer: Self-pay | Admitting: Family Medicine

## 2018-01-20 DIAGNOSIS — E785 Hyperlipidemia, unspecified: Secondary | ICD-10-CM

## 2018-01-21 NOTE — Telephone Encounter (Signed)
Requested Prescriptions  Pending Prescriptions Disp Refills  . ezetimibe (ZETIA) 10 MG tablet [Pharmacy Med Name: EZETIMIBE 10 MG TABLET] 90 tablet 0    Sig: TAKE ONE TABLET BY MOUTH DAILY     Cardiovascular:  Antilipid - Sterol Transport Inhibitors Passed - 01/20/2018  2:09 PM      Passed - Total Cholesterol in normal range and within 360 days    Cholesterol, Total  Date Value Ref Range Status  07/22/2017 137 100 - 199 mg/dL Final         Passed - LDL in normal range and within 360 days    LDL Calculated  Date Value Ref Range Status  07/22/2017 62 0 - 99 mg/dL Final         Passed - HDL in normal range and within 360 days    HDL  Date Value Ref Range Status  07/22/2017 58 >39 mg/dL Final         Passed - Triglycerides in normal range and within 360 days    Triglycerides  Date Value Ref Range Status  07/22/2017 87 0 - 149 mg/dL Final         Passed - Valid encounter within last 12 months    Recent Outpatient Visits          3 weeks ago Difficult airway for intubation, subsequent encounter   Primary Care at [facility], Arlie Solomons, MD   4 months ago Encounter for [employer] annual wellness exam   Primary Care at [facility], Ranell Patrick, MD   6 months ago Hyperlipidemia, unspecified hyperlipidemia type   Primary Care at [facility], Ranell Patrick, MD   6 months ago Hyperlipidemia, unspecified hyperlipidemia type   Primary Care at [facility], Ranell Patrick, MD   7 months ago Insomnia, unspecified type   Primary Care at [facility], Ranell Patrick, MD

## 2018-02-18 ENCOUNTER — Other Ambulatory Visit: Payer: Self-pay | Admitting: Family Medicine

## 2018-02-19 NOTE — Telephone Encounter (Signed)
Requested Prescriptions  Pending Prescriptions Disp Refills  . oxybutynin (DITROPAN-XL) 10 MG 24 hr tablet [Pharmacy Med Name: OXYBUTYNIN CL ER 10 MG TABLET] 90 tablet 1    Sig: TAKE ONE TABLET BY MOUTH DAILY     Urology:  Bladder Agents Passed - 02/18/2018 12:30 PM      Passed - Valid encounter within last 12 months    Recent Outpatient Visits          1 month ago Difficult airway for intubation, subsequent encounter   Primary Care at [facility], Arlie Solomons, MD   5 months ago Encounter for Medicare annual wellness exam   Primary Care at [facility], Ranell Patrick, MD   7 months ago Hyperlipidemia, unspecified hyperlipidemia type   Primary Care at [facility], Ranell Patrick, MD   7 months ago Hyperlipidemia, unspecified hyperlipidemia type   Primary Care at [facility], Ranell Patrick, MD   8 months ago Insomnia, unspecified type   Primary Care at [facility], Ranell Patrick, MD

## 2018-03-03 ENCOUNTER — Other Ambulatory Visit: Payer: Self-pay

## 2018-03-03 ENCOUNTER — Encounter: Payer: Self-pay | Admitting: Family Medicine

## 2018-03-03 ENCOUNTER — Ambulatory Visit: Payer: Medicare Other | Admitting: Family Medicine

## 2018-03-03 VITALS — BP 108/65 | HR 59 | Temp 98.1°F | Resp 14 | Ht 63.0 in | Wt 142.0 lb

## 2018-03-03 DIAGNOSIS — H01135 Eczematous dermatitis of left lower eyelid: Secondary | ICD-10-CM

## 2018-03-03 DIAGNOSIS — J3089 Other allergic rhinitis: Secondary | ICD-10-CM | POA: Diagnosis not present

## 2018-03-03 DIAGNOSIS — Z9181 History of falling: Secondary | ICD-10-CM | POA: Diagnosis not present

## 2018-03-03 MED ORDER — HYDROCORTISONE 2.5 % EX CREA: TOPICAL_CREAM | Freq: Two times a day (BID) | CUTANEOUS | 0 refills | Status: DC

## 2018-03-03 NOTE — Patient Instructions (Addendum)
   If you have lab work done today you will be contacted with your lab results within the next 2 weeks.  If you have not heard from us then please contact us. The fastest way to get your results is to register for My Chart.   IF you received an x-ray today, you will receive an invoice from [facility]. Please contact [facility] at [phone] with questions or concerns regarding your invoice.   IF you received labwork today, you will receive an invoice from [facility]. Please contact [facility] at [phone] with questions or concerns regarding your invoice.   Our billing staff will not be able to assist you with questions regarding bills from these companies.  You will be contacted with the lab results as soon as they are available. The fastest way to get your results is to activate your My Chart account. Instructions are located on the last page of this paperwork. If you have not heard from us regarding the results in 2 weeks, please contact this office.    Allergic Rhinitis, Adult Allergic rhinitis is an allergic reaction that affects the mucous membrane inside the nose. It causes sneezing, a runny or stuffy nose, and the feeling of mucus going down the back of the throat (postnasal drip). Allergic rhinitis can be mild to severe. There are two types of allergic rhinitis:  Seasonal. This type is also called hay fever. It happens only during certain seasons.  Perennial. This type can happen at any time of the year. What are the causes? This condition happens when the body's defense system (immune system) responds to certain harmless substances called allergens as though they were germs.  Seasonal allergic rhinitis is triggered by pollen, which can come from grasses, trees, and weeds. Perennial allergic rhinitis may be caused by:  House dust mites.  Pet dander.  Mold spores. What are the signs or symptoms? Symptoms of this condition include:  Sneezing.  Runny  or stuffy nose (nasal congestion).  Postnasal drip.  Itchy nose.  Tearing of the eyes.  Trouble sleeping.  Daytime sleepiness. How is this diagnosed? This condition may be diagnosed based on:  Your medical history.  A physical exam.  Tests to check for related conditions, such as: ? Asthma. ? Pink eye. ? Ear infection. ? Upper respiratory infection.  Tests to find out which allergens trigger your symptoms. These may include skin or blood tests. How is this treated? There is no cure for this condition, but treatment can help control symptoms. Treatment may include:  Taking medicines that block allergy symptoms, such as antihistamines. Medicine may be given as a shot, nasal spray, or pill.  Avoiding the allergen.  Desensitization. This treatment involves getting ongoing shots until your body becomes less sensitive to the allergen. This treatment may be done if other treatments do not help.  If taking medicine and avoiding the allergen does not work, new, stronger medicines may be prescribed. Follow these instructions at home:  Find out what you are allergic to. Common allergens include smoke, dust, and pollen.  Avoid the things you are allergic to. These are some things you can do to help avoid allergens: ? Replace carpet with wood, tile, or vinyl flooring. Carpet can trap dander and dust. ? Do not smoke. Do not allow smoking in your home. ? Change your heating and air conditioning filter at least once a month. ? During allergy season:  Keep windows closed as much as possible.  Plan outdoor activities when   pollen counts are lowest. This is usually during the evening hours.  When coming indoors, change clothing and shower before sitting on furniture or bedding.  Take over-the-counter and prescription medicines only as told by your health care provider.  Keep all follow-up visits as told by your health care provider. This is important. Contact a health care provider  if:  You have a fever.  You develop a persistent cough.  You make whistling sounds when you breathe (you wheeze).  Your symptoms interfere with your normal daily activities. Get help right away if:  You have shortness of breath. Summary  This condition can be managed by taking medicines as directed and avoiding allergens.  Contact your health care provider if you develop a persistent cough or fever.  During allergy season, keep windows closed as much as possible. This information is not intended to replace advice given to you by your health care provider. Make sure you discuss any questions you have with your health care provider. Document Released: 10/30/2000 Document Revised: 03/14/2016 Document Reviewed: 03/14/2016 Elsevier Interactive Patient Education  2019 [employer]  

## 2018-03-03 NOTE — Progress Notes (Signed)
Established Patient Office Visit  Subjective:  Patient ID: Cheryl Cooper, female    DOB: 08/02/1940  Age: 78 y.o. MRN: [id number]  CC:  Chief Complaint  Patient presents with  . Facial Swelling    swelling of the left bottom eye lid, no itchy, litlle burning, little irritation x 3.5 wks. Want to talk about b12 to see if i need to be on any b12     HPI WARREN KUGELMAN presents for   3.5 weeks of itching and redness of the eye lid No conjunctival irritation No vision changes She reports that she is also sneezing a lot more  She reports that she gets the itching mostly on the bottom left eye She has dry eyes She sneezing and also gets some runny nose from seasonal allergies     She reports that she saw on the news about b12 and that deficiency can affect balance She reports that she has been having a few falls as well  She has had some reflux and takes PPIs so she was wondering if she is absorbing enough B12 Lab Results  Component Value Date   VITAMINB12 627 12/02/2014     Past Medical History:  Diagnosis Date  . Anxiety   . Carpal tunnel syndrome   . Deafness in left ear   . Depression   . Diabetes mellitus    diet controlled  . GERD (gastroesophageal reflux disease)   . Hypercholesteremia   . Hypertension   . Hypothyroid   . Memory loss    reports d/t concussion  . PONV (postoperative nausea and vomiting) yrs ago, none recent  . Post concussion syndrome   . Stroke [facility])    reports they were silent    Past Surgical History:  Procedure Laterality Date  . ABDOMINAL HYSTERECTOMY  1986  . BACK SURGERY  2010   lower  . BIOPSY  01/12/2018   Procedure: BIOPSY;  Surgeon: Irving Copas., MD;  Location: [facility];  Service: Gastroenterology;;  . ESOPHAGOGASTRODUODENOSCOPY (EGD) WITH PROPOFOL N/A 01/12/2018   Procedure: ESOPHAGOGASTRODUODENOSCOPY (EGD) WITH PROPOFOL;  Surgeon: Irving Copas., MD;  Location: [facility];  Service:  Gastroenterology;  Laterality: N/A;  . left elobow surgery  2003  . left rotator cuff  2001  . LUMBAR LAMINECTOMY/DECOMPRESSION MICRODISCECTOMY Left 12/23/2017   Procedure: Laminectomy for facet/synovial cyst - left - Lumbar three-Lumbar four;  Surgeon: Earnie Larsson, MD;  Location: [place];  Service: Neurosurgery;  Laterality: Left;  . r foot surgery  1995  . right hand surgery  2001  . right index finger surgery  2009  . SHOULDER OPEN ROTATOR CUFF REPAIR  11/21/2011   Procedure: ROTATOR CUFF REPAIR SHOULDER OPEN;  Surgeon: Johnn Hai, MD;  Location: [facility];  Service: Orthopedics;  Laterality: Right;  with subacromial decompression  . SHOULDER OPEN ROTATOR CUFF REPAIR Right 05/09/2016   Procedure: Right shoulder mini open revision rotator cuff repair, subacromial decompression;  Surgeon: Susa Day, MD;  [facility];  Service: Orthopedics;  Laterality: Right;  . TOTAL HIP ARTHROPLASTY Right 04/24/2017   Procedure: RIGHT TOTAL HIP ARTHROPLASTY ANTERIOR APPROACH;  Surgeon: Rod Can, MD;  [facility];  Service: Orthopedics;  Laterality: Right;  Needs RNFA    Family History  Problem Relation Age of Onset  . Stroke Father   . Stroke Sister   . Stroke Brother   . Diabetes Brother   . Dementia Brother   . Stroke Sister  TIA    Social History   Socioeconomic History  . Marital status: Married    Spouse name: Herschel  . Number of children: 3  . Years of education: 79  . Highest education level: Not on file  Occupational History    Comment: retired  Scientific laboratory technician  . Financial resource strain: Not on file  . Food insecurity:    Worry: Not on file    Inability: Not on file  . Transportation needs:    Medical: Not on file    Non-medical: Not on file  Tobacco Use  . Smoking status: Never Smoker  . Smokeless tobacco: Never Used  Substance and Sexual Activity  . Alcohol use: Yes    Alcohol/week: 1.0 standard drinks    Types: 1 Glasses of wine per week     Comment: glass wine/day  . Drug use: No  . Sexual activity: Not Currently    Comment: 1st intercourse 78 yo-Fewer than 5 partners  Lifestyle  . Physical activity:    Days per week: Not on file    Minutes per session: Not on file  . Stress: Not on file  Relationships  . Social connections:    Talks on phone: Not on file    Gets together: Not on file    Attends religious service: Not on file    Active member of club or organization: Not on file    Attends meetings of clubs or organizations: Not on file    Relationship status: Not on file  . Intimate partner violence:    Fear of current or ex partner: Not on file    Emotionally abused: Not on file    Physically abused: Not on file    Forced sexual activity: Not on file  Other Topics Concern  . Not on file  Social History Narrative   Married, lives at home with husband   caffeine - 1 Coke daily    Outpatient Medications Prior to Visit  Medication Sig Dispense Refill  . alendronate (FOSAMAX) 70 MG tablet Take 70 mg by mouth every Monday. Take with a full glass of water on an empty stomach.     [name] amLODipine (NORVASC) 5 MG tablet Take 5 mg by mouth daily.     [name] aspirin 81 MG chewable tablet Chew 1 tablet (81 mg total) by mouth 2 (two) times daily. (Patient taking differently: Chew 81 mg by mouth 2 (two) times a week. ) 60 tablet 1  . Calcium Carbonate-Vitamin D (CALCIUM-D PO) Take 2 tablets by mouth daily.    [name] escitalopram (LEXAPRO) 10 MG tablet Take 10 mg by mouth daily.   3  . esomeprazole (NEXIUM) 40 MG capsule Take 1 capsule (40 mg total) by mouth 2 (two) times daily before a meal. 60 capsule 0  . ezetimibe (ZETIA) 10 MG tablet TAKE ONE TABLET BY MOUTH DAILY 90 tablet 0  . levothyroxine (SYNTHROID, LEVOTHROID) 75 MCG tablet Take 75 mcg by mouth daily with breakfast.    . oxybutynin (DITROPAN-XL) 10 MG 24 hr tablet TAKE ONE TABLET BY MOUTH DAILY 90 tablet 1  . simvastatin (ZOCOR) 40 MG tablet Take 1 tablet (40 mg total) by mouth  at bedtime. 90 tablet 1  . SUPER B COMPLEX/C PO Take 1 tablet by mouth daily.    . traZODone (DESYREL) 50 MG tablet Take 50 mg by mouth at bedtime.    [name] ampicillin (PRINCIPEN) 500 MG capsule Take 500 mg by mouth 2 (two) times daily.    [name]  hydrOXYzine (ATARAX/VISTARIL) 10 MG tablet Take 1 tablet (10 mg total) by mouth at bedtime as needed for anxiety (and sleep.). 30 tablet 0  . lamoTRIgine (LAMICTAL) 100 MG tablet Take 100 mg by mouth daily.     [name] triamcinolone cream (KENALOG) 0.1 % Apply 1 application topically 2 (two) times daily.     No facility-administered medications prior to visit.     Allergies  Allergen Reactions  . Other Other (See Comments)    Tomato sauce, garlic, onion - severe acid reflux   . Flexeril [Cyclobenzaprine] Other (See Comments)    Per spouse "she felt like a zombie"  . Atorvastatin Other (See Comments)    Unbalanced  . Chlordiazepoxide-Clidinium Other (See Comments)    Dizziness (intolerance)  . Codeine Nausea Only    ROS Review of Systems  No cough, wheezing, sob She reports some falls on ROS    Objective:    Physical Exam  BP 108/65 (BP Location: Right Arm, Patient Position: Sitting, Cuff Size: Normal)   Pulse (!) 59   Temp 98.1 F (36.7 C) (Oral)   Resp 14   Ht 5\' 3"  (1.6 m)   Wt 142 lb (64.4 kg)   SpO2 97%   BMI 25.15 kg/m  Wt Readings from Last 3 Encounters:  03/03/18 142 lb (64.4 kg)  01/12/18 154 lb 1.6 oz (69.9 kg)  12/30/17 154 lb 3.2 oz (69.9 kg)   General: alert, oriented, in NAD Head: normocephalic, atraumatic, no sinus tenderness Eyes: EOM intact, no scleral icterus or conjunctival injection, PERRLA Ears: TM clear bilaterally Nose: mucosa nonerythematous, nonedematous Throat: no pharyngeal exudate or erythema Lymph: no posterior auricular, submental or cervical lymph adenopathy Heart: normal rate, normal sinus rhythm, no murmurs Lungs: clear to auscultation bilaterally, no wheezing    Health Maintenance Due  Topic  Date Due  . URINE MICROALBUMIN  06/23/1950    There are no preventive care reminders to display for this patient.  Lab Results  Component Value Date   TSH 4.032 01/11/2018   Lab Results  Component Value Date   WBC 9.4 01/12/2018   HGB 10.8 (L) 01/13/2018   HCT 35.6 (L) 01/13/2018   MCV 85.8 01/12/2018   PLT 373 01/12/2018     Assessment & Plan:   Problem List Items Addressed This Visit    None    Visit Diagnoses    At risk for falls    -  Primary Will check levels of B12 today Last levels were good and patient is not a vegetarian so offered reassurance but will check levels    Relevant Orders   Vitamin B12   Non-seasonal allergic rhinitis due to other allergic trigger       Eczematous dermatitis of left lower eyelid    -  ADVISED CLARITIN AND HYDROCORTISONE externally for 2 weeks  If no improvement return to clinic      Meds ordered this encounter  Medications  . hydrocortisone 2.5 % cream    Sig: Apply topically 2 (two) times daily. For 2 weeks    Dispense:  30 g    Refill:  0    Follow-up: No follow-ups on file.    Forrest Moron, MD

## 2018-03-04 LAB — VITAMIN B12: VITAMIN B 12: 406 pg/mL (ref 232–1245)

## 2018-03-21 ENCOUNTER — Encounter: Payer: Self-pay | Admitting: Family Medicine

## 2018-03-21 DIAGNOSIS — K279 Peptic ulcer, site unspecified, unspecified as acute or chronic, without hemorrhage or perforation: Secondary | ICD-10-CM | POA: Insufficient documentation

## 2018-05-07 ENCOUNTER — Telehealth: Payer: Self-pay | Admitting: Family Medicine

## 2018-05-07 DIAGNOSIS — E785 Hyperlipidemia, unspecified: Secondary | ICD-10-CM

## 2018-05-07 NOTE — Telephone Encounter (Signed)
Pharm is calling and last rx they received was on 01-21-2018 # [phone] with no refills. Pharm received a reply that said pt should have enough med until may 2020. Please verify

## 2018-05-10 ENCOUNTER — Other Ambulatory Visit: Payer: Self-pay | Admitting: Family Medicine

## 2018-05-10 DIAGNOSIS — E785 Hyperlipidemia, unspecified: Secondary | ICD-10-CM

## 2018-05-11 ENCOUNTER — Other Ambulatory Visit: Payer: Self-pay | Admitting: Family Medicine

## 2018-05-11 DIAGNOSIS — E785 Hyperlipidemia, unspecified: Secondary | ICD-10-CM

## 2018-05-11 NOTE — Telephone Encounter (Signed)
The pharmacy states it is time for the medication to be refilled and that the pt is now out of medication.

## 2018-05-11 NOTE — Telephone Encounter (Signed)
Please advise 

## 2018-05-15 ENCOUNTER — Telehealth: Payer: Self-pay | Admitting: Family Medicine

## 2018-05-15 NOTE — Telephone Encounter (Signed)
See other encounter. Paper from pharmacy re: zetia refill it appears this was sent in 3/23 for #[phone]. Tried to verify with pharmacy but closed.

## 2018-05-21 ENCOUNTER — Telehealth: Payer: Self-pay | Admitting: Family Medicine

## 2018-05-21 NOTE — Telephone Encounter (Signed)
Copied from [place] [id number]. Topic: Quick Communication - Rx Refill/Question >> May 21, 2018  3:07 PM Sheran Luz wrote: Medication: amLODipine (NORVASC) 5 MG tablet   Patient is requesting refill of this medication.   Preferred Pharmacy (with phone number or street name):[facility] at [place], [place] - [address] [phone] (Phone) [phone] (Fax

## 2018-05-22 ENCOUNTER — Encounter: Payer: Self-pay | Admitting: Family Medicine

## 2018-05-22 ENCOUNTER — Telehealth (INDEPENDENT_AMBULATORY_CARE_PROVIDER_SITE_OTHER): Payer: Medicare Other | Admitting: Family Medicine

## 2018-05-22 ENCOUNTER — Other Ambulatory Visit: Payer: Self-pay

## 2018-05-22 DIAGNOSIS — Z8673 Personal history of transient ischemic attack (TIA), and cerebral infarction without residual deficits: Secondary | ICD-10-CM

## 2018-05-22 DIAGNOSIS — E039 Hypothyroidism, unspecified: Secondary | ICD-10-CM | POA: Diagnosis not present

## 2018-05-22 DIAGNOSIS — I10 Essential (primary) hypertension: Secondary | ICD-10-CM

## 2018-05-22 MED ORDER — AMLODIPINE BESYLATE 5 MG PO TABS: 5.0000 mg | ORAL_TABLET | Freq: Every day | ORAL | 1 refills | Status: DC

## 2018-05-22 NOTE — Progress Notes (Signed)
Pt c/o medication refill for amlodipine and would like to discuss levothyroxine so it will be on file when she needs a refill. Will take BP at home to tell you her reading. No travel.

## 2018-05-22 NOTE — Progress Notes (Signed)
Virtual Visit via Telephone Note  I connected with Cheryl Cooper on 05/22/18 at 12:38 PM by telephone and verified that I am speaking with the correct person using two identifiers.   I discussed the limitations, risks, security and privacy concerns of performing an evaluation and management service by telephone and the availability of in person appointments. I also discussed with the patient that there may be a patient responsible charge related to this service. The patient expressed understanding and agreed to proceed, consent obtained  Chief complaint: HTN, med refills.   History of Present Illness: Hypertension: BP Readings from Last 3 Encounters:  03/03/18 108/65  01/13/18 (!) 142/65  01/05/18 (!) 143/74   Lab Results  Component Value Date   CREATININE 0.70 01/12/2018  no new lightheadedness/dizziness, new weakness or chest pains. No recent falls or unsteadiness.  BP: 151/75 today. Home readings about once per week. Ranges 140's on upper number, rare 150.  Usually in 70's on bottom number.  On norvasc 5mg  qd - no missed doses.  Hx of CVA.   Hypothyroidism: Lab Results  Component Value Date   TSH 4.032 01/11/2018  stable in November.  Synthroid 81mcg per day. Lost weight since GB surgery and hospitalization last year. 138 at home - staying at same weight past few months. No recent decreases.  Wt Readings from Last 3 Encounters:  03/03/18 142 lb (64.4 kg)  01/12/18 154 lb 1.6 oz (69.9 kg)  12/30/17 154 lb 3.2 oz (69.9 kg)      Patient Active Problem List   Diagnosis Date Noted  . PUD (peptic ulcer disease) 03/21/2018  . Nausea and vomiting 01/11/2018  . Synovial cyst of lumbar facet joint 12/23/2017  . Osteoarthritis of right hip 04/24/2017  . Primary osteoarthritis of right hip 04/24/2017  . Complete rotator cuff tear 05/09/2016  . Brainstem stroke ([place]) 01/04/2015  . Dizziness and giddiness 01/04/2015  . Post concussion syndrome 01/04/2015  . Rotator cuff  tear, right 11/21/2011   Past Medical History:  Diagnosis Date  . Anxiety   . Carpal tunnel syndrome   . Deafness in left ear   . Depression   . Diabetes mellitus    diet controlled  . GERD (gastroesophageal reflux disease)   . Hypercholesteremia   . Hypertension   . Hypothyroid   . Memory loss    reports d/t concussion  . PONV (postoperative nausea and vomiting) yrs ago, none recent  . Post concussion syndrome   . Stroke [facility])    reports they were silent   Past Surgical History:  Procedure Laterality Date  . ABDOMINAL HYSTERECTOMY  1986  . BACK SURGERY  2010   lower  . BIOPSY  01/12/2018   Procedure: BIOPSY;  Surgeon: Irving Copas., MD;  Location: [facility];  Service: Gastroenterology;;  . ESOPHAGOGASTRODUODENOSCOPY (EGD) WITH PROPOFOL N/A 01/12/2018   Procedure: ESOPHAGOGASTRODUODENOSCOPY (EGD) WITH PROPOFOL;  Surgeon: Irving Copas., MD;  Location: [facility];  Service: Gastroenterology;  Laterality: N/A;  . left elobow surgery  2003  . left rotator cuff  2001  . LUMBAR LAMINECTOMY/DECOMPRESSION MICRODISCECTOMY Left 12/23/2017   Procedure: Laminectomy for facet/synovial cyst - left - Lumbar three-Lumbar four;  Surgeon: Earnie Larsson, MD;  Location: [place];  Service: Neurosurgery;  Laterality: Left;  . r foot surgery  1995  . right hand surgery  2001  . right index finger surgery  2009  . SHOULDER OPEN ROTATOR CUFF REPAIR  11/21/2011   Procedure: ROTATOR CUFF REPAIR SHOULDER  OPEN;  Surgeon: Johnn Hai, MD;  [facility];  Service: Orthopedics;  Laterality: Right;  with subacromial decompression  . SHOULDER OPEN ROTATOR CUFF REPAIR Right 05/09/2016   Procedure: Right shoulder mini open revision rotator cuff repair, subacromial decompression;  Surgeon: Susa Day, MD;  [facility];  Service: Orthopedics;  Laterality: Right;  . TOTAL HIP ARTHROPLASTY Right 04/24/2017   Procedure: RIGHT TOTAL HIP ARTHROPLASTY ANTERIOR APPROACH;  Surgeon:  Rod Can, MD;  [facility];  Service: Orthopedics;  Laterality: Right;  Needs RNFA   Allergies  Allergen Reactions  . Other Other (See Comments)    Tomato sauce, garlic, onion - severe acid reflux   . Flexeril [Cyclobenzaprine] Other (See Comments)    Per spouse "she felt like a zombie"  . Atorvastatin Other (See Comments)    Unbalanced  . Chlordiazepoxide-Clidinium Other (See Comments)    Dizziness (intolerance)  . Codeine Nausea Only   Prior to Admission medications   Medication Sig Start Date End Date Taking? Authorizing Provider  amLODipine (NORVASC) 5 MG tablet Take 5 mg by mouth daily.  04/05/15  Yes [provider]  aspirin 81 MG chewable tablet Chew 1 tablet (81 mg total) by mouth 2 (two) times daily. Patient taking differently: Chew 81 mg by mouth 2 (two) times a week.  04/25/17  Yes Swinteck, Aaron Edelman, MD  Calcium Carbonate-Vitamin D (CALCIUM-D PO) Take 2 tablets by mouth daily.   Yes [provider]  escitalopram (LEXAPRO) 10 MG tablet Take 10 mg by mouth daily.  09/03/14  Yes [provider]  esomeprazole (NEXIUM) 40 MG capsule Take 1 capsule (40 mg total) by mouth 2 (two) times daily before a meal. 01/13/18  Yes Mikhail, Norco, DO  ezetimibe (ZETIA) 10 MG tablet TAKE ONE TABLET BY MOUTH DAILY 05/11/18  Yes Wendie Agreste, MD  levothyroxine (SYNTHROID, LEVOTHROID) 75 MCG tablet Take 75 mcg by mouth daily with breakfast.   Yes [provider]  simvastatin (ZOCOR) 40 MG tablet Take 1 tablet (40 mg total) by mouth at bedtime. 07/21/17  Yes Wendie Agreste, MD  SUPER B COMPLEX/C PO Take 1 tablet by mouth daily.   Yes [provider]  traZODone (DESYREL) 50 MG tablet Take 50 mg by mouth at bedtime.   Yes [provider]  alendronate (FOSAMAX) 70 MG tablet Take 70 mg by mouth every Monday. Take with a full glass of water on an empty stomach.     [provider]  hydrocortisone 2.5 % cream Apply topically 2 (two)  times daily. For 2 weeks Patient not taking: Reported on 05/22/2018 03/03/18   Forrest Moron, MD  oxybutynin (DITROPAN-XL) 10 MG 24 hr tablet TAKE ONE TABLET BY MOUTH DAILY Patient not taking: Reported on 05/22/2018 02/19/18   Wendie Agreste, MD   Social History   Socioeconomic History  . Marital status: Married    Spouse name: Herschel  . Number of children: 3  . Years of education: 29  . Highest education level: Not on file  Occupational History    Comment: retired  Scientific laboratory technician  . Financial resource strain: Not on file  . Food insecurity:    Worry: Not on file    Inability: Not on file  . Transportation needs:    Medical: Not on file    Non-medical: Not on file  Tobacco Use  . Smoking status: Never Smoker  . Smokeless tobacco: Never Used  Substance and Sexual Activity  . Alcohol  use: Yes    Alcohol/week: 1.0 standard drinks    Types: 1 Glasses of wine per week    Comment: glass wine/day  . Drug use: No  . Sexual activity: Not Currently    Comment: 1st intercourse 78 yo-Fewer than 5 partners  Lifestyle  . Physical activity:    Days per week: Not on file    Minutes per session: Not on file  . Stress: Not on file  Relationships  . Social connections:    Talks on phone: Not on file    Gets together: Not on file    Attends religious service: Not on file    Active member of club or organization: Not on file    Attends meetings of clubs or organizations: Not on file    Relationship status: Not on file  . Intimate partner violence:    Fear of current or ex partner: Not on file    Emotionally abused: Not on file    Physically abused: Not on file    Forced sexual activity: Not on file  Other Topics Concern  . Not on file  Social History Narrative   Married, lives at home with husband   caffeine - 1 Coke daily     Observations/Objective: No distress on phone.  Intelligible speech, appropriate responses.  Assessment and Plan: Essential hypertension History of  CVA in adulthood  -Ideally would like to see readings under 130/80 with history of CVA.  Some variability in home readings, but 71I diastolic.  Memory in office was much better in January so hesitant to change meds  -Monitor home readings and if remaining over 130 advise me so we can discuss plan.  Potentially may need an office BP check to correlate with her home meter to make sure it is obtain accurate readings before changing meds.  -In office assessment in the next 2 months with labs at that time  Hypothyroidism, unspecified type  -Stable less than 6 months ago.  Denies recent change in symptoms.  Continue same dose of Synthroid for now with plan for repeat testing in 2 months.  Will hold on in office labs at this time due to current COVID-19 pandemic but if new symptoms can have lab only visit if needed  Follow Up Instructions:   2 months, sooner if persistent elevated blood pressure readings at home.    Patient Instructions  Blood pressure in office few months ago was better than your home readings.  Keep a record of your blood pressures outside of the office using info below, but if those numbers remain over 130 on upper number -  let me know in the next week or two.  Return to the clinic or go to the nearest emergency room if any of your symptoms worsen or new symptoms occur.   Follow-up with an office visit in approximately 2 months and we can review your other medications and perform fasting lab work at that time.  Please let me know if there are questions sooner.     How to Take Your Blood Pressure You can take your blood pressure at home with a machine. You may need to check your blood pressure at home:  To check if you have high blood pressure (hypertension).  To check your blood pressure over time.  To make sure your blood pressure medicine is working. Supplies needed: You will need a blood pressure machine, or monitor. You can buy one at a drugstore or online. When  choosing one:  Choose one with an arm cuff.  Choose one that wraps around your upper arm. Only one finger should fit between your arm and the cuff.  Do not choose one that measures your blood pressure from your wrist or finger. Your doctor can suggest a monitor. How to prepare Avoid these things for 30 minutes before checking your blood pressure:  Drinking caffeine.  Drinking alcohol.  Eating.  Smoking.  Exercising. Five minutes before checking your blood pressure:  Pee.  Sit in a dining chair. Avoid sitting in a soft couch or armchair.  Be quiet. Do not talk. How to take your blood pressure Follow the instructions that came with your machine. If you have a digital blood pressure monitor, these may be the instructions: 1. Sit up straight. 2. Place your feet on the floor. Do not cross your ankles or legs. 3. Rest your left arm at the level of your heart. You may rest it on a table, desk, or chair. 4. Pull up your shirt sleeve. 5. Wrap the blood pressure cuff around the upper part of your left arm. The cuff should be 1 inch (2.5 cm) above your elbow. It is best to wrap the cuff around bare skin. 6. Fit the cuff snugly around your arm. You should be able to place only one finger between the cuff and your arm. 7. Put the cord inside the groove of your elbow. 8. Press the power button. 9. Sit quietly while the cuff fills with air and loses air. 10. Write down the numbers on the screen. 11. Wait 2-3 minutes and then repeat steps 1-10. What do the numbers mean? Two numbers make up your blood pressure. The first number is called systolic pressure. The second is called diastolic pressure. An example of a blood pressure reading is "120 over 80" (or 120/80). If you are an adult and do not have a medical condition, use this guide to find out if your blood pressure is normal: Normal  First number: below 120.  Second number: below 80. Elevated  First number: 120-129.  Second  number: below 80. Hypertension stage 1  First number: 130-139.  Second number: 80-89. Hypertension stage 2  First number: 140 or above.  Second number: 92 or above. Your blood pressure is above normal even if only the top or bottom number is above normal. Follow these instructions at home:  Check your blood pressure as often as your doctor tells you to.  Take your monitor to your next doctor's appointment. Your doctor will: ? Make sure you are using it correctly. ? Make sure it is working right.  Make sure you understand what your blood pressure numbers should be.  Tell your doctor if your medicines are causing side effects. Contact a doctor if:  Your blood pressure keeps being high. Get help right away if:  Your first blood pressure number is higher than 180.  Your second blood pressure number is higher than 120. This information is not intended to replace advice given to you by your health care provider. Make sure you discuss any questions you have with your health care provider. Document Released: 01/18/2008 Document Revised: 01/03/2016 Document Reviewed: 07/14/2015 Elsevier Interactive Patient Education  2019 [employer].    I discussed the assessment and treatment plan with the patient. The patient was provided an opportunity to ask questions and all were answered. The patient agreed with the plan and demonstrated an understanding of the instructions.   The patient was advised to call  back or seek an in-person evaluation if the symptoms worsen or if the condition fails to improve as anticipated.  I provided 15 minutes of non-face-to-face time during this encounter.  Signed,   Merri Ray, MD [facility] [place].  05/22/18

## 2018-05-22 NOTE — Telephone Encounter (Signed)
Spoke with patient and she stated she knows she has never received amlodipine from [place] (primary care at [place]) before.  She states that [facility] has just been refilling it for her.  I advised her that she will need lab work and blood pressure check to evaluate her metabolic valves and get a baseline of her current blood pressure.  Patient fully understood and she was transferred to clerical Velna Hatchet) to schedule an appointment with Dr. Carlota Raspberry.  Patient was also advised her appointment will possibly be conducted over the phone and the only office visit needed will be to draw labs and check blood pressure. She agreed to this plan.

## 2018-05-22 NOTE — Patient Instructions (Addendum)
Blood pressure in office few months ago was better than your home readings.  Keep a record of your blood pressures outside of the office using info below, but if those numbers remain over 130 on upper number -  let me know in the next week or two.  Return to the clinic or go to the nearest emergency room if any of your symptoms worsen or new symptoms occur.   Follow-up with an office visit in approximately 2 months and we can review your other medications and perform fasting lab work at that time.  Please let me know if there are questions sooner.     How to Take Your Blood Pressure You can take your blood pressure at home with a machine. You may need to check your blood pressure at home:  To check if you have high blood pressure (hypertension).  To check your blood pressure over time.  To make sure your blood pressure medicine is working. Supplies needed: You will need a blood pressure machine, or monitor. You can buy one at a drugstore or online. When choosing one:  Choose one with an arm cuff.  Choose one that wraps around your upper arm. Only one finger should fit between your arm and the cuff.  Do not choose one that measures your blood pressure from your wrist or finger. Your doctor can suggest a monitor. How to prepare Avoid these things for 30 minutes before checking your blood pressure:  Drinking caffeine.  Drinking alcohol.  Eating.  Smoking.  Exercising. Five minutes before checking your blood pressure:  Pee.  Sit in a dining chair. Avoid sitting in a soft couch or armchair.  Be quiet. Do not talk. How to take your blood pressure Follow the instructions that came with your machine. If you have a digital blood pressure monitor, these may be the instructions: 1. Sit up straight. 2. Place your feet on the floor. Do not cross your ankles or legs. 3. Rest your left arm at the level of your heart. You may rest it on a table, desk, or chair. 4. Pull up your shirt  sleeve. 5. Wrap the blood pressure cuff around the upper part of your left arm. The cuff should be 1 inch (2.5 cm) above your elbow. It is best to wrap the cuff around bare skin. 6. Fit the cuff snugly around your arm. You should be able to place only one finger between the cuff and your arm. 7. Put the cord inside the groove of your elbow. 8. Press the power button. 9. Sit quietly while the cuff fills with air and loses air. 10. Write down the numbers on the screen. 11. Wait 2-3 minutes and then repeat steps 1-10. What do the numbers mean? Two numbers make up your blood pressure. The first number is called systolic pressure. The second is called diastolic pressure. An example of a blood pressure reading is "120 over 80" (or 120/80). If you are an adult and do not have a medical condition, use this guide to find out if your blood pressure is normal: Normal  First number: below 120.  Second number: below 80. Elevated  First number: 120-129.  Second number: below 80. Hypertension stage 1  First number: 130-139.  Second number: 80-89. Hypertension stage 2  First number: 140 or above.  Second number: 40 or above. Your blood pressure is above normal even if only the top or bottom number is above normal. Follow these instructions at home:  Check  your blood pressure as often as your doctor tells you to.  Take your monitor to your next doctor's appointment. Your doctor will: ? Make sure you are using it correctly. ? Make sure it is working right.  Make sure you understand what your blood pressure numbers should be.  Tell your doctor if your medicines are causing side effects. Contact a doctor if:  Your blood pressure keeps being high. Get help right away if:  Your first blood pressure number is higher than 180.  Your second blood pressure number is higher than 120. This information is not intended to replace advice given to you by your health care provider. Make sure you  discuss any questions you have with your health care provider. Document Released: 01/18/2008 Document Revised: 01/03/2016 Document Reviewed: 07/14/2015 Elsevier Interactive Patient Education  2019 [employer].

## 2018-05-27 ENCOUNTER — Other Ambulatory Visit: Payer: Self-pay | Admitting: Family Medicine

## 2018-05-27 NOTE — Telephone Encounter (Signed)
Requested Prescriptions  Pending Prescriptions Disp Refills  . simvastatin (ZOCOR) 20 MG tablet [Pharmacy Med Name: SIMVASTATIN 20 MG TABLET] 90 tablet 0    Sig: TAKE ONE TABLET BY MOUTH AT BEDTIME     Cardiovascular:  Antilipid - Statins Passed - 05/27/2018 10:50 AM      Passed - Total Cholesterol in normal range and within 360 days    Cholesterol, Total  Date Value Ref Range Status  07/22/2017 137 100 - 199 mg/dL Final         Passed - LDL in normal range and within 360 days    LDL Calculated  Date Value Ref Range Status  07/22/2017 62 0 - 99 mg/dL Final         Passed - HDL in normal range and within 360 days    HDL  Date Value Ref Range Status  07/22/2017 58 >39 mg/dL Final         Passed - Triglycerides in normal range and within 360 days    Triglycerides  Date Value Ref Range Status  07/22/2017 87 0 - 149 mg/dL Final         Passed - Patient is not pregnant      Passed - Valid encounter within last 12 months    Recent Outpatient Visits          2 months ago At risk for falls   [facility] at [facility], Arlie Solomons, MD   4 months ago Difficult airway for intubation, subsequent encounter   [facility] at [facility], Arlie Solomons, MD   8 months ago Encounter for Medicare annual wellness exam   [facility] at [place], [place]   10 months ago Hyperlipidemia, unspecified hyperlipidemia type   [facility] at [place], MD   10 months ago Hyperlipidemia, unspecified hyperlipidemia type   [facility] at [facility], Ranell Patrick, MD      Future Appointments            In 1 month Carlota Raspberry Ranell Patrick, MD [facility] at [place], [facility]

## 2018-07-15 ENCOUNTER — Other Ambulatory Visit (HOSPITAL_BASED_OUTPATIENT_CLINIC_OR_DEPARTMENT_OTHER): Payer: Self-pay | Admitting: Gynecology

## 2018-07-15 DIAGNOSIS — Z1231 Encounter for screening mammogram for malignant neoplasm of breast: Secondary | ICD-10-CM

## 2018-07-21 ENCOUNTER — Other Ambulatory Visit: Payer: Self-pay

## 2018-07-21 ENCOUNTER — Ambulatory Visit: Payer: Medicare Other | Admitting: Family Medicine

## 2018-07-21 ENCOUNTER — Encounter: Payer: Self-pay | Admitting: Family Medicine

## 2018-07-21 VITALS — BP 116/61 | HR 62 | Temp 97.9°F | Resp 14 | Wt 141.6 lb

## 2018-07-21 DIAGNOSIS — Z131 Encounter for screening for diabetes mellitus: Secondary | ICD-10-CM

## 2018-07-21 DIAGNOSIS — R350 Frequency of micturition: Secondary | ICD-10-CM | POA: Diagnosis not present

## 2018-07-21 DIAGNOSIS — N3281 Overactive bladder: Secondary | ICD-10-CM

## 2018-07-21 DIAGNOSIS — E785 Hyperlipidemia, unspecified: Secondary | ICD-10-CM | POA: Diagnosis not present

## 2018-07-21 DIAGNOSIS — I10 Essential (primary) hypertension: Secondary | ICD-10-CM | POA: Diagnosis not present

## 2018-07-21 DIAGNOSIS — E039 Hypothyroidism, unspecified: Secondary | ICD-10-CM | POA: Diagnosis not present

## 2018-07-21 LAB — POCT URINALYSIS DIP (MANUAL ENTRY)
Bilirubin, UA: NEGATIVE
Blood, UA: NEGATIVE
Glucose, UA: NEGATIVE mg/dL
Ketones, POC UA: NEGATIVE mg/dL
Leukocytes, UA: NEGATIVE
Nitrite, UA: NEGATIVE
Protein Ur, POC: NEGATIVE mg/dL
Spec Grav, UA: 1.015 (ref 1.010–1.025)
Urobilinogen, UA: 0.2 E.U./dL
pH, UA: 5.5 (ref 5.0–8.0)

## 2018-07-21 LAB — LIPID PANEL

## 2018-07-21 MED ORDER — OXYBUTYNIN CHLORIDE ER 15 MG PO TB24: 15.0000 mg | ORAL_TABLET | Freq: Every day | ORAL | 1 refills | Status: DC

## 2018-07-21 MED ORDER — SIMVASTATIN 20 MG PO TABS: 20.0000 mg | ORAL_TABLET | Freq: Every day | ORAL | 1 refills | Status: DC

## 2018-07-21 MED ORDER — EZETIMIBE 10 MG PO TABS: 10.0000 mg | ORAL_TABLET | Freq: Every day | ORAL | 1 refills | Status: DC

## 2018-07-21 MED ORDER — AMLODIPINE BESYLATE 5 MG PO TABS: 5.0000 mg | ORAL_TABLET | Freq: Every day | ORAL | 1 refills | Status: DC

## 2018-07-21 MED ORDER — LEVOTHYROXINE SODIUM 75 MCG PO TABS: 75.0000 ug | ORAL_TABLET | Freq: Every day | ORAL | 1 refills | Status: DC

## 2018-07-21 NOTE — Progress Notes (Signed)
Subjective:    Patient ID: Cheryl Cooper, female    DOB: 05-Jul-1940, 78 y.o.   MRN: [id number]  HPI Cheryl Cooper is a 78 y.o. female Presents today for: Chief Complaint  Patient presents with  . Hypothyroidism     2 month f/u   . Hypertension    patient stated bp at home 140/70.   [name] Hyperlipidemia    need a refill on her levothyroxine. patient also would like to know she can increase her oxybutynin so she would not have to use the bath room as much   Hypothyroidism: Lab Results  Component Value Date   TSH 4.032 01/11/2018  Telemedicine visit April 3.  75 mcg/day of Synthroid at that time.  Plan for TSH today.  Cold natured- always, no recent temp/hair/skin changes.  No palpitations.   Hypertension: BP Readings from Last 3 Encounters:  07/21/18 116/61  03/03/18 108/65  01/13/18 (!) 142/65   Lab Results  Component Value Date   CREATININE 0.70 01/12/2018  Last discussed April 3, borderline elevated numbers at home, better in office was on Norvasc 5 mg daily.  History of CVA.  Ideally would like to see below 130/80 with history of CVA.  Labs today.  No new side effects. Occasional stress headache.   Hyperlipidemia:  Lab Results  Component Value Date   CHOL 137 07/22/2017   HDL 58 07/22/2017   LDLCALC 62 07/22/2017   TRIG 87 07/22/2017   CHOLHDL 2.4 07/22/2017   Lab Results  Component Value Date   ALT 15 01/11/2018   AST 18 01/11/2018   ALKPHOS 38 01/11/2018   BILITOT 0.3 01/11/2018  Zocor 20 mg daily.  Zetia 10 mg daily. No new myalgias, or other known side effects.   Urinary frequency/overactive bladder  Has been on Ditropan XL 10 mg daily for years. Past few months has noted more frequency. No fever, dysuria or abd pain. Similar as in past, but feels like needs higher dose of Ditropan.    Review of Systems  Constitutional: Negative for fatigue and unexpected weight change.  Respiratory: Negative for chest tightness and shortness of breath.    Cardiovascular: Negative for chest pain, palpitations and leg swelling.  Gastrointestinal: Negative for abdominal pain and blood in stool.  Neurological: Negative for dizziness, syncope, light-headedness and headaches.   Patient Active Problem List   Diagnosis Date Noted  . PUD (peptic ulcer disease) 03/21/2018  . Nausea and vomiting 01/11/2018  . Synovial cyst of lumbar facet joint 12/23/2017  . Osteoarthritis of right hip 04/24/2017  . Primary osteoarthritis of right hip 04/24/2017  . Complete rotator cuff tear 05/09/2016  . Brainstem stroke ([place]) 01/04/2015  . Dizziness and giddiness 01/04/2015  . Post concussion syndrome 01/04/2015  . Rotator cuff tear, right 11/21/2011   Past Medical History:  Diagnosis Date  . Anxiety   . Carpal tunnel syndrome   . Deafness in left ear   . Depression   . Diabetes mellitus    diet controlled  . GERD (gastroesophageal reflux disease)   . Hypercholesteremia   . Hypertension   . Hypothyroid   . Memory loss    reports d/t concussion  . PONV (postoperative nausea and vomiting) yrs ago, none recent  . Post concussion syndrome   . Stroke [facility])    reports they were silent   Past Surgical History:  Procedure Laterality Date  . ABDOMINAL HYSTERECTOMY  1986  . BACK SURGERY  2010   lower  .  BIOPSY  01/12/2018   Procedure: BIOPSY;  Surgeon: Irving Copas., MD;  Location: [facility];  Service: Gastroenterology;;  . ESOPHAGOGASTRODUODENOSCOPY (EGD) WITH PROPOFOL N/A 01/12/2018   Procedure: ESOPHAGOGASTRODUODENOSCOPY (EGD) WITH PROPOFOL;  Surgeon: Irving Copas., MD;  Location: [facility];  Service: Gastroenterology;  Laterality: N/A;  . left elobow surgery  2003  . left rotator cuff  2001  . LUMBAR LAMINECTOMY/DECOMPRESSION MICRODISCECTOMY Left 12/23/2017   Procedure: Laminectomy for facet/synovial cyst - left - Lumbar three-Lumbar four;  Surgeon: Earnie Larsson, MD;  Location: [facility];  Service: Neurosurgery;  Laterality:  Left;  . r foot surgery  1995  . right hand surgery  2001  . right index finger surgery  2009  . SHOULDER OPEN ROTATOR CUFF REPAIR  11/21/2011   Procedure: ROTATOR CUFF REPAIR SHOULDER OPEN;  Surgeon: Johnn Hai, MD;  Location: [facility];  Service: Orthopedics;  Laterality: Right;  with subacromial decompression  . SHOULDER OPEN ROTATOR CUFF REPAIR Right 05/09/2016   Procedure: Right shoulder mini open revision rotator cuff repair, subacromial decompression;  Surgeon: Susa Day, MD;  Location: [facility];  Service: Orthopedics;  Laterality: Right;  . TOTAL HIP ARTHROPLASTY Right 04/24/2017   Procedure: RIGHT TOTAL HIP ARTHROPLASTY ANTERIOR APPROACH;  Surgeon: Rod Can, MD;  Location: [facility];  Service: Orthopedics;  Laterality: Right;  Needs RNFA   Allergies  Allergen Reactions  . Other Other (See Comments)    Tomato sauce, garlic, onion - severe acid reflux   . Flexeril [Cyclobenzaprine] Other (See Comments)    Per spouse "she felt like a zombie"  . Atorvastatin Other (See Comments)    Unbalanced  . Chlordiazepoxide-Clidinium Other (See Comments)    Dizziness (intolerance)  . Codeine Nausea Only   Prior to Admission medications   Medication Sig Start Date End Date Taking? Authorizing Provider  alendronate (FOSAMAX) 70 MG tablet Take 70 mg by mouth every Monday. Take with a full glass of water on an empty stomach.    Yes [provider]  amLODipine (NORVASC) 5 MG tablet Take 1 tablet (5 mg total) by mouth daily. 05/22/18  Yes Wendie Agreste, MD  aspirin 81 MG chewable tablet Chew 1 tablet (81 mg total) by mouth 2 (two) times daily. Patient taking differently: Chew 81 mg by mouth 2 (two) times a week.  04/25/17  Yes Swinteck, Aaron Edelman, MD  Calcium Carbonate-Vitamin D (CALCIUM-D PO) Take 2 tablets by mouth daily.   Yes [provider]  escitalopram (LEXAPRO) 10 MG tablet Take 10 mg by mouth daily.  09/03/14  Yes [provider]  esomeprazole (NEXIUM) 40 MG  capsule Take 1 capsule (40 mg total) by mouth 2 (two) times daily before a meal. 01/13/18  Yes Mikhail, [place], DO  ezetimibe (ZETIA) 10 MG tablet TAKE ONE TABLET BY MOUTH DAILY 05/11/18  Yes Wendie Agreste, MD  levothyroxine (SYNTHROID, LEVOTHROID) 75 MCG tablet Take 75 mcg by mouth daily with breakfast.   Yes [provider]  oxybutynin (DITROPAN-XL) 10 MG 24 hr tablet TAKE ONE TABLET BY MOUTH DAILY 02/19/18  Yes Wendie Agreste, MD  simvastatin (ZOCOR) 20 MG tablet TAKE ONE TABLET BY MOUTH AT BEDTIME 05/27/18  Yes Wendie Agreste, MD  simvastatin (ZOCOR) 40 MG tablet Take 1 tablet (40 mg total) by mouth at bedtime. 07/21/17  Yes Wendie Agreste, MD  SUPER B COMPLEX/C PO Take 1 tablet by mouth daily.   Yes [provider]  traZODone (DESYREL) 50 MG tablet Take 50  mg by mouth at bedtime.   Yes [provider]   Social History   Socioeconomic History  . Marital status: Married    Spouse name: Herschel  . Number of children: 3  . Years of education: 27  . Highest education level: Not on file  Occupational History    Comment: retired  Scientific laboratory technician  . Financial resource strain: Not on file  . Food insecurity:    Worry: Not on file    Inability: Not on file  . Transportation needs:    Medical: Not on file    Non-medical: Not on file  Tobacco Use  . Smoking status: Never Smoker  . Smokeless tobacco: Never Used  Substance and Sexual Activity  . Alcohol use: Yes    Alcohol/week: 1.0 standard drinks    Types: 1 Glasses of wine per week    Comment: glass wine/day  . Drug use: No  . Sexual activity: Not Currently    Comment: 1st intercourse 78 yo-Fewer than 5 partners  Lifestyle  . Physical activity:    Days per week: Not on file    Minutes per session: Not on file  . Stress: Not on file  Relationships  . Social connections:    Talks on phone: Not on file    Gets together: Not on file    Attends religious service: Not on file    Active member of  club or organization: Not on file    Attends meetings of clubs or organizations: Not on file    Relationship status: Not on file  . Intimate partner violence:    Fear of current or ex partner: Not on file    Emotionally abused: Not on file    Physically abused: Not on file    Forced sexual activity: Not on file  Other Topics Concern  . Not on file  Social History Narrative   Married, lives at home with husband   caffeine - 1 Coke daily       Objective:   Physical Exam Vitals signs reviewed.  Constitutional:      Appearance: She is well-developed.  HENT:     Head: Normocephalic and atraumatic.  Eyes:     Conjunctiva/sclera: Conjunctivae normal.     Pupils: Pupils are equal, round, and reactive to light.  Neck:     Vascular: No carotid bruit.  Cardiovascular:     Rate and Rhythm: Normal rate and regular rhythm.     Heart sounds: Normal heart sounds.  Pulmonary:     Effort: Pulmonary effort is normal.     Breath sounds: Normal breath sounds.  Abdominal:     General: Bowel sounds are normal. There is no distension.     Palpations: Abdomen is soft. There is no pulsatile mass.     Tenderness: There is no abdominal tenderness. There is no right CVA tenderness, left CVA tenderness or guarding.  Skin:    General: Skin is warm and dry.  Neurological:     Mental Status: She is alert and oriented to person, place, and time.  Psychiatric:        Behavior: Behavior normal.    Vitals:   07/21/18 0752  BP: 116/61  Pulse: 62  Resp: 14  Temp: 97.9 F (36.6 C)  TempSrc: Oral  SpO2: 96%  Weight: 141 lb 9.6 oz (64.2 kg)   Results for orders placed or performed in visit on 07/21/18  POCT urinalysis dipstick  Result Value Ref Range  Color, UA yellow yellow   Clarity, UA clear clear   Glucose, UA negative negative mg/dL   Bilirubin, UA negative negative   Ketones, POC UA negative negative mg/dL   Spec Grav, UA 1.015 1.010 - 1.025   Blood, UA negative negative   pH, UA 5.5  5.0 - 8.0   Protein Ur, POC negative negative mg/dL   Urobilinogen, UA 0.2 0.2 or 1.0 E.U./dL   Nitrite, UA Negative Negative   Leukocytes, UA Negative Negative        Assessment & Plan:    Cheryl Cooper is a 78 y.o. female Essential hypertension - Plan: Comprehensive metabolic panel, Microalbumin/Creatinine Ratio, Urine, amLODipine (NORVASC) 5 MG tablet  -  Stable, tolerating current regimen. Medications refilled. Labs pending as above.   Hypothyroidism, unspecified type - Plan: TSH, levothyroxine (SYNTHROID) 75 MCG tablet  -  Stable, tolerating current regimen. Medications refilled. Labs pending as above.   Hyperlipidemia, unspecified hyperlipidemia type - Plan: Lipid Panel, ezetimibe (ZETIA) 10 MG tablet, simvastatin (ZOCOR) 20 MG tablet   Stable, tolerating current regimen. Medications refilled. Labs pending as above.   Screening for diabetes mellitus  - CMP  Overactive bladder - Plan: POCT urinalysis dipstick, oxybutynin (DITROPAN XL) 15 MG 24 hr tablet Urinary frequency - Plan: Microalbumin/Creatinine Ratio, Urine, POCT urinalysis dipstick, oxybutynin (DITROPAN XL) 15 MG 24 hr tablet  -Typical symptoms of overactive bladder, denies infectious symptoms.  Check urinalysis, but will try higher dose of Ditropan XL for now with potential side effects and risks discussed, RTC precautions.  9-month recheck.  Meds ordered this encounter  Medications  . ezetimibe (ZETIA) 10 MG tablet    Sig: Take 1 tablet (10 mg total) by mouth daily.    Dispense:  90 tablet    Refill:  1  . simvastatin (ZOCOR) 20 MG tablet    Sig: Take 1 tablet (20 mg total) by mouth at bedtime.    Dispense:  90 tablet    Refill:  1  . amLODipine (NORVASC) 5 MG tablet    Sig: Take 1 tablet (5 mg total) by mouth daily.    Dispense:  90 tablet    Refill:  1  . levothyroxine (SYNTHROID) 75 MCG tablet    Sig: Take 1 tablet (75 mcg total) by mouth daily with breakfast.    Dispense:  90 tablet    Refill:  1   . oxybutynin (DITROPAN XL) 15 MG 24 hr tablet    Sig: Take 1 tablet (15 mg total) by mouth at bedtime.    Dispense:  90 tablet    Refill:  1   Patient Instructions    We can try higher dose of Ditropan at this time.  Watch for lightheadedness, dizziness, dry mouth.  If any difficulty initiating urination, be seen right away.  No other change in medications at this time.  Follow-up in 6 months.   Overactive Bladder, Adult  Overactive bladder refers to a condition in which a person has a sudden need to pass urine. The person may leak urine if he or she cannot get to the bathroom fast enough (urinary incontinence). A person with this condition may also wake up several times in the night to go to the bathroom. Overactive bladder is associated with poor nerve signals between your bladder and your brain. Your bladder may get the signal to empty before it is full. You may also have very sensitive muscles that make your bladder squeeze too soon. These symptoms might interfere  with daily work or social activities. What are the causes? This condition may be associated with or caused by:  Urinary tract infection.  Infection of nearby tissues, such as the prostate.  Prostate enlargement.  Surgery on the uterus or urethra.  Bladder stones, inflammation, or tumors.  Drinking too much caffeine or alcohol.  Certain medicines, especially medicines that get rid of extra fluid in the body (diuretics).  Muscle or nerve weakness, especially from: ? A spinal cord injury. ? Stroke. ? Multiple sclerosis. ? Parkinson's disease.  Diabetes.  Constipation. What increases the risk? You may be at greater risk for overactive bladder if you:  Are an older adult.  Smoke.  Are going through menopause.  Have prostate problems.  Have a neurological disease, such as stroke, dementia, Parkinson's disease, or multiple sclerosis (MS).  Eat or drink things that irritate the bladder. These include  alcohol, spicy food, and caffeine.  Are overweight or obese. What are the signs or symptoms? Symptoms of this condition include:  Sudden, strong urge to urinate.  Leaking urine.  Urinating 8 or more times a day.  Waking up to urinate 2 or more times a night. How is this diagnosed? Your health care provider may suspect overactive bladder based on your symptoms. He or she will diagnose this condition by:  A physical exam and medical history.  Blood or urine tests. You might need bladder or urine tests to help determine what is causing your overactive bladder. You might also need to see a health care provider who specializes in urinary tract problems (urologist). How is this treated? Treatment for overactive bladder depends on the cause of your condition and whether it is mild or severe. You can also make lifestyle changes at home. Options include:  Bladder training. This may include: ? Learning to control the urge to urinate by following a schedule that directs you to urinate at regular intervals (timed voiding). ? Doing Kegel exercises to strengthen your pelvic floor muscles, which support your bladder. Toning these muscles can help you control urination, even if your bladder muscles are overactive.  Special devices. This may include: ? Biofeedback, which uses sensors to help you become aware of your body's signals. ? Electrical stimulation, which uses electrodes placed inside the body (implanted) or outside the body. These electrodes send gentle pulses of electricity to strengthen the nerves or muscles that control the bladder. ? Women may use a plastic device that fits into the vagina and supports the bladder (pessary).  Medicines. ? Antibiotics to treat bladder infection. ? Antispasmodics to stop the bladder from releasing urine at the wrong time. ? Tricyclic antidepressants to relax bladder muscles. ? Injections of botulinum toxin type A directly into the bladder tissue to  relax bladder muscles.  Lifestyle changes. This may include: ? Weight loss. Talk to your health care provider about weight loss methods that would work best for you. ? Diet changes. This may include reducing how much alcohol and caffeine you consume, or drinking fluids at different times of the day. ? Not smoking. Do not use any products that contain nicotine or tobacco, such as cigarettes and e-cigarettes. If you need help quitting, ask your health care provider.  Surgery. ? A device may be implanted to help manage the nerve signals that control urination. ? An electrode may be implanted to stimulate electrical signals in the bladder. ? A procedure may be done to change the shape of the bladder. This is done only in very severe cases.  Follow these instructions at home: Lifestyle  Make any diet or lifestyle changes that are recommended by your health care provider. These may include: ? Drinking less fluid or drinking fluids at different times of the day. ? Cutting down on caffeine or alcohol. ? Doing Kegel exercises. ? Losing weight if needed. ? Eating a healthy and balanced diet to prevent constipation. This may include:  Eating foods that are high in fiber, such as fresh fruits and vegetables, whole grains, and beans.  Limiting foods that are high in fat and processed sugars, such as fried and sweet foods. General instructions  Take over-the-counter and prescription medicines only as told by your health care provider.  If you were prescribed an antibiotic medicine, take it as told by your health care provider. Do not stop taking the antibiotic even if you start to feel better.  Use any implants or pessary as told by your health care provider.  If needed, wear pads to absorb urine leakage.  Keep a journal or log to track how much and when you drink and when you feel the need to urinate. This will help your health care provider monitor your condition.  Keep all follow-up visits as  told by your health care provider. This is important. Contact a health care provider if:  You have a fever.  Your symptoms do not get better with treatment.  Your pain and discomfort get worse.  You have more frequent urges to urinate. Get help right away if:  You are not able to control your bladder. Summary  Overactive bladder refers to a condition in which a person has a sudden need to pass urine.  Several conditions may lead to an overactive bladder.  Treatment for overactive bladder depends on the cause and severity of your condition.  Follow your health care provider's instructions about lifestyle changes, doing Kegel exercises, keeping a journal, and taking medicines. This information is not intended to replace advice given to you by your health care provider. Make sure you discuss any questions you have with your health care provider. Document Released: 12/01/2008 Document Revised: 02/20/2017 Document Reviewed: 02/20/2017 Elsevier Interactive Patient Education  [employer].    If you have lab work done today you will be contacted with your lab results within the next 2 weeks.  If you have not heard from [place] then please contact us. The fastest way to get your results is to register for My Chart.   IF you received an x-ray today, you will receive an invoice from [facility]. Please contact [facility] at [phone] with questions or concerns regarding your invoice.   IF you received labwork today, you will receive an invoice from [facility]. Please contact [facility] at [phone] with questions or concerns regarding your invoice.   Our billing staff will not be able to assist you with questions regarding bills from these companies.  You will be contacted with the lab results as soon as they are available. The fastest way to get your results is to activate your My Chart account. Instructions are located on the last page of this paperwork. If you have  not heard from [place] regarding the results in 2 weeks, please contact this office.       Signed,   Merri Ray, MD [facility].  07/21/18 8:40 AM

## 2018-07-21 NOTE — Patient Instructions (Addendum)
We can try higher dose of Ditropan at this time.  Watch for lightheadedness, dizziness, dry mouth.  If any difficulty initiating urination, be seen right away.  No other change in medications at this time.  Follow-up in 6 months.   Overactive Bladder, Adult  Overactive bladder refers to a condition in which a person has a sudden need to pass urine. The person may leak urine if he or she cannot get to the bathroom fast enough (urinary incontinence). A person with this condition may also wake up several times in the night to go to the bathroom. Overactive bladder is associated with poor nerve signals between your bladder and your brain. Your bladder may get the signal to empty before it is full. You may also have very sensitive muscles that make your bladder squeeze too soon. These symptoms might interfere with daily work or social activities. What are the causes? This condition may be associated with or caused by:  Urinary tract infection.  Infection of nearby tissues, such as the prostate.  Prostate enlargement.  Surgery on the uterus or urethra.  Bladder stones, inflammation, or tumors.  Drinking too much caffeine or alcohol.  Certain medicines, especially medicines that get rid of extra fluid in the body (diuretics).  Muscle or nerve weakness, especially from: ? A spinal cord injury. ? Stroke. ? Multiple sclerosis. ? Parkinson's disease.  Diabetes.  Constipation. What increases the risk? You may be at greater risk for overactive bladder if you:  Are an older adult.  Smoke.  Are going through menopause.  Have prostate problems.  Have a neurological disease, such as stroke, dementia, Parkinson's disease, or multiple sclerosis (MS).  Eat or drink things that irritate the bladder. These include alcohol, spicy food, and caffeine.  Are overweight or obese. What are the signs or symptoms? Symptoms of this condition include:  Sudden, strong urge to  urinate.  Leaking urine.  Urinating 8 or more times a day.  Waking up to urinate 2 or more times a night. How is this diagnosed? Your health care provider may suspect overactive bladder based on your symptoms. He or she will diagnose this condition by:  A physical exam and medical history.  Blood or urine tests. You might need bladder or urine tests to help determine what is causing your overactive bladder. You might also need to see a health care provider who specializes in urinary tract problems (urologist). How is this treated? Treatment for overactive bladder depends on the cause of your condition and whether it is mild or severe. You can also make lifestyle changes at home. Options include:  Bladder training. This may include: ? Learning to control the urge to urinate by following a schedule that directs you to urinate at regular intervals (timed voiding). ? Doing Kegel exercises to strengthen your pelvic floor muscles, which support your bladder. Toning these muscles can help you control urination, even if your bladder muscles are overactive.  Special devices. This may include: ? Biofeedback, which uses sensors to help you become aware of your body's signals. ? Electrical stimulation, which uses electrodes placed inside the body (implanted) or outside the body. These electrodes send gentle pulses of electricity to strengthen the nerves or muscles that control the bladder. ? Women may use a plastic device that fits into the vagina and supports the bladder (pessary).  Medicines. ? Antibiotics to treat bladder infection. ? Antispasmodics to stop the bladder from releasing urine at the wrong time. ? Tricyclic antidepressants to relax bladder  muscles. ? Injections of botulinum toxin type A directly into the bladder tissue to relax bladder muscles.  Lifestyle changes. This may include: ? Weight loss. Talk to your health care provider about weight loss methods that would work best for  you. ? Diet changes. This may include reducing how much alcohol and caffeine you consume, or drinking fluids at different times of the day. ? Not smoking. Do not use any products that contain nicotine or tobacco, such as cigarettes and e-cigarettes. If you need help quitting, ask your health care provider.  Surgery. ? A device may be implanted to help manage the nerve signals that control urination. ? An electrode may be implanted to stimulate electrical signals in the bladder. ? A procedure may be done to change the shape of the bladder. This is done only in very severe cases. Follow these instructions at home: Lifestyle  Make any diet or lifestyle changes that are recommended by your health care provider. These may include: ? Drinking less fluid or drinking fluids at different times of the day. ? Cutting down on caffeine or alcohol. ? Doing Kegel exercises. ? Losing weight if needed. ? Eating a healthy and balanced diet to prevent constipation. This may include:  Eating foods that are high in fiber, such as fresh fruits and vegetables, whole grains, and beans.  Limiting foods that are high in fat and processed sugars, such as fried and sweet foods. General instructions  Take over-the-counter and prescription medicines only as told by your health care provider.  If you were prescribed an antibiotic medicine, take it as told by your health care provider. Do not stop taking the antibiotic even if you start to feel better.  Use any implants or pessary as told by your health care provider.  If needed, wear pads to absorb urine leakage.  Keep a journal or log to track how much and when you drink and when you feel the need to urinate. This will help your health care provider monitor your condition.  Keep all follow-up visits as told by your health care provider. This is important. Contact a health care provider if:  You have a fever.  Your symptoms do not get better with  treatment.  Your pain and discomfort get worse.  You have more frequent urges to urinate. Get help right away if:  You are not able to control your bladder. Summary  Overactive bladder refers to a condition in which a person has a sudden need to pass urine.  Several conditions may lead to an overactive bladder.  Treatment for overactive bladder depends on the cause and severity of your condition.  Follow your health care provider's instructions about lifestyle changes, doing Kegel exercises, keeping a journal, and taking medicines. This information is not intended to replace advice given to you by your health care provider. Make sure you discuss any questions you have with your health care provider. Document Released: 12/01/2008 Document Revised: 02/20/2017 Document Reviewed: 02/20/2017 Elsevier Interactive Patient Education  [employer].    If you have lab work done today you will be contacted with your lab results within the next 2 weeks.  If you have not heard from [place] then please contact us. The fastest way to get your results is to register for My Chart.   IF you received an x-ray today, you will receive an invoice from [facility]. Please contact [facility] at [phone] with questions or concerns regarding your invoice.   IF you received  labwork today, you will receive an invoice from [employer]. Please contact [facility] at [phone] with questions or concerns regarding your invoice.   Our billing staff will not be able to assist you with questions regarding bills from these companies.  You will be contacted with the lab results as soon as they are available. The fastest way to get your results is to activate your My Chart account. Instructions are located on the last page of this paperwork. If you have not heard from [place] regarding the results in 2 weeks, please contact this office.

## 2018-07-22 LAB — COMPREHENSIVE METABOLIC PANEL
ALT: 27 IU/L (ref 0–32)
AST: 28 IU/L (ref 0–40)
Albumin/Globulin Ratio: 1.7 (ref 1.2–2.2)
Albumin: 4 g/dL (ref 3.7–4.7)
Alkaline Phosphatase: 36 IU/L — ABNORMAL LOW (ref 39–117)
BUN/Creatinine Ratio: 10 — ABNORMAL LOW (ref 12–28)
BUN: 9 mg/dL (ref 8–27)
Bilirubin Total: <0.2 mg/dL (ref 0.0–1.2)
CO2: 24 mmol/L (ref 20–29)
Calcium: 9.5 mg/dL (ref 8.7–10.3)
Chloride: 102 mmol/L (ref 96–106)
Creatinine, Ser: 0.9 mg/dL (ref 0.57–1.00)
GFR calc Af Amer: 72 mL/min/{1.73_m2} (ref >59)
GFR calc non Af Amer: 62 mL/min/{1.73_m2} (ref >59)
Globulin, Total: 2.4 g/dL (ref 1.5–4.5)
Glucose: 125 mg/dL — ABNORMAL HIGH (ref 65–99)
Potassium: 3.9 mmol/L (ref 3.5–5.2)
Sodium: 143 mmol/L (ref 134–144)
Total Protein: 6.4 g/dL (ref 6.0–8.5)

## 2018-07-22 LAB — LIPID PANEL
Chol/HDL Ratio: 3.5 ratio (ref 0.0–4.4)
Cholesterol, Total: 166 mg/dL (ref 100–199)
HDL: 47 mg/dL (ref >39)
LDL Calculated: 67 mg/dL (ref 0–99)
Triglycerides: 261 mg/dL — ABNORMAL HIGH (ref 0–149)
VLDL Cholesterol Cal: 52 mg/dL — ABNORMAL HIGH (ref 5–40)

## 2018-07-22 LAB — MICROALBUMIN / CREATININE URINE RATIO
Creatinine, Urine: 149.8 mg/dL
Microalb/Creat Ratio: 6 mg/g creat (ref 0–29)
Microalbumin, Urine: 8.7 ug/mL

## 2018-07-22 LAB — TSH: TSH: 3.61 u[IU]/mL (ref 0.450–4.500)

## 2018-08-03 ENCOUNTER — Other Ambulatory Visit: Payer: Self-pay | Admitting: Emergency Medicine

## 2018-08-03 NOTE — Telephone Encounter (Signed)
Dr Carlota Raspberry , a fax came in from [place] for one touch ultra blue test strip to check her blood sugar once daily. I do not see where this patient has an dx for diabetes. Is this ok to still fill for patient? I do she we have checked patients glucose here in office a few times. Just wanted to clarify with you before I filled this request.

## 2018-08-04 ENCOUNTER — Encounter: Payer: Medicare Other | Admitting: Gynecology

## 2018-08-07 ENCOUNTER — Inpatient Hospital Stay (HOSPITAL_BASED_OUTPATIENT_CLINIC_OR_DEPARTMENT_OTHER): Admission: RE | Admit: 2018-08-07 | Payer: Medicare Other | Source: Ambulatory Visit

## 2018-08-10 ENCOUNTER — Other Ambulatory Visit: Payer: Self-pay

## 2018-08-10 ENCOUNTER — Ambulatory Visit (HOSPITAL_BASED_OUTPATIENT_CLINIC_OR_DEPARTMENT_OTHER)
Admission: RE | Admit: 2018-08-10 | Discharge: 2018-08-10 | Disposition: A | Discharge status: Home / Self Care | Payer: Medicare Other | Source: Ambulatory Visit | Attending: Gynecology | Admitting: Gynecology

## 2018-08-10 ENCOUNTER — Encounter (HOSPITAL_BASED_OUTPATIENT_CLINIC_OR_DEPARTMENT_OTHER): Payer: Self-pay

## 2018-08-10 DIAGNOSIS — Z1231 Encounter for screening mammogram for malignant neoplasm of breast: Secondary | ICD-10-CM | POA: Diagnosis not present

## 2018-08-11 ENCOUNTER — Other Ambulatory Visit: Payer: Self-pay

## 2018-08-11 ENCOUNTER — Other Ambulatory Visit: Payer: Self-pay | Admitting: Gynecology

## 2018-08-11 DIAGNOSIS — R928 Other abnormal and inconclusive findings on diagnostic imaging of breast: Secondary | ICD-10-CM

## 2018-08-12 ENCOUNTER — Ambulatory Visit: Payer: Medicare Other | Admitting: Gynecology

## 2018-08-12 ENCOUNTER — Encounter: Payer: Self-pay | Admitting: Gynecology

## 2018-08-12 VITALS — BP 124/70 | Ht 63.0 in | Wt 143.0 lb

## 2018-08-12 DIAGNOSIS — Z01419 Encounter for gynecological examination (general) (routine) without abnormal findings: Secondary | ICD-10-CM | POA: Diagnosis not present

## 2018-08-12 DIAGNOSIS — N952 Postmenopausal atrophic vaginitis: Secondary | ICD-10-CM | POA: Diagnosis not present

## 2018-08-12 DIAGNOSIS — M8580 Other specified disorders of bone density and structure, unspecified site: Secondary | ICD-10-CM | POA: Diagnosis not present

## 2018-08-12 NOTE — Progress Notes (Signed)
    CHEYENNE BORDEAUX [birth year] [id number]        78 y.o.  [id number] for annual gynecologic exam.  Without gynecologic complaints  Past medical history,surgical history, problem list, medications, allergies, family history and social history were all reviewed and documented as reviewed in the EPIC chart.  ROS:  Performed with pertinent positives and negatives included in the history, assessment and plan.   Additional significant findings : None   Exam: Caryn Bee assistant Vitals:   08/12/18 1112  BP: 124/70  Weight: 143 lb (64.9 kg)  Height: 5\' 3"  (1.6 m)   Body mass index is 25.33 kg/m.  General appearance:  Normal affect, orientation and appearance. Skin: Grossly normal HEENT: Without gross lesions.  No cervical or supraclavicular adenopathy. Thyroid normal.  Lungs:  Clear without wheezing, rales or rhonchi Cardiac: RR, without RMG Abdominal:  Soft, nontender, without masses, guarding, rebound, organomegaly or hernia Breasts:  Examined lying and sitting without masses, retractions, discharge or axillary adenopathy. Pelvic:  Ext, BUS, Vagina: With atrophic changes  Adnexa: Without masses or tenderness    Anus and perineum: Normal   Rectovaginal: Normal sphincter tone without palpated masses or tenderness.    Assessment/Plan:  78 y.o. [id number] female for annual gynecologic exam.   1. Postmenopausal.  Status post hysterectomy in the past for benign reasons.  Still retains her ovaries.  Exam today is normal for age.  No significant menopausal symptoms. 2. Osteopenia.  DEXA 2018 T score -1.6 FRAX 17% / 3.3%.  Is on alendronate through her primary physician.  We will continue to follow-up with them in reference to this.  She will ask them when they want to repeat her bone density. 3. Pap smear a number of years ago.  No Pap smear done today.  No history of abnormal Pap smears.  We both agree to stop screening per current screening guidelines. 4. Colonoscopy 2018.  Repeat at their  recommended interval. 5. Mammogram 2 days ago.  Has follow-up diagnostic with ultrasound due to area seen on screening mammogram this coming Friday.  We will follow-up for that.  Will triage based upon these results.  Breast exam normal today. 6. Health maintenance.  No routine lab work done as patient does this elsewhere.  Follow-up 1 year, sooner as needed.   Anastasio Auerbach MD, 11:42 AM 08/12/2018

## 2018-08-12 NOTE — Patient Instructions (Signed)
Follow-up in 1 year for annual exam 

## 2018-08-14 ENCOUNTER — Ambulatory Visit
Admission: RE | Admit: 2018-08-14 | Discharge: 2018-08-14 | Disposition: A | Discharge status: Home / Self Care | Payer: Medicare Other | Source: Ambulatory Visit | Attending: Gynecology | Admitting: Gynecology

## 2018-08-14 ENCOUNTER — Other Ambulatory Visit: Payer: Self-pay | Admitting: Gynecology

## 2018-08-14 ENCOUNTER — Other Ambulatory Visit: Payer: Self-pay

## 2018-08-14 DIAGNOSIS — R928 Other abnormal and inconclusive findings on diagnostic imaging of breast: Secondary | ICD-10-CM

## 2018-08-14 DIAGNOSIS — N6320 Unspecified lump in the left breast, unspecified quadrant: Secondary | ICD-10-CM

## 2018-08-19 ENCOUNTER — Other Ambulatory Visit: Payer: Self-pay

## 2018-08-19 ENCOUNTER — Ambulatory Visit
Admission: RE | Admit: 2018-08-19 | Discharge: 2018-08-19 | Disposition: A | Discharge status: Home / Self Care | Payer: Medicare Other | Source: Ambulatory Visit | Attending: Gynecology | Admitting: Gynecology

## 2018-08-19 DIAGNOSIS — N6320 Unspecified lump in the left breast, unspecified quadrant: Secondary | ICD-10-CM

## 2018-09-28 ENCOUNTER — Ambulatory Visit (INDEPENDENT_AMBULATORY_CARE_PROVIDER_SITE_OTHER): Payer: Medicare Other

## 2018-09-28 ENCOUNTER — Ambulatory Visit (INDEPENDENT_AMBULATORY_CARE_PROVIDER_SITE_OTHER): Payer: Medicare Other | Admitting: Family Medicine

## 2018-09-28 ENCOUNTER — Ambulatory Visit: Payer: Medicare Other

## 2018-09-28 ENCOUNTER — Other Ambulatory Visit: Payer: Self-pay

## 2018-09-28 ENCOUNTER — Encounter: Payer: Self-pay | Admitting: Family Medicine

## 2018-09-28 VITALS — BP 123/67 | HR 68 | Temp 98.3°F | Ht 63.0 in | Wt 144.4 lb

## 2018-09-28 DIAGNOSIS — Z130 Encounter for screening for diseases of the blood and blood-forming organs and certain disorders involving the immune mechanism: Secondary | ICD-10-CM

## 2018-09-28 DIAGNOSIS — Z01818 Encounter for other preprocedural examination: Secondary | ICD-10-CM | POA: Diagnosis not present

## 2018-09-28 DIAGNOSIS — Z8673 Personal history of transient ischemic attack (TIA), and cerebral infarction without residual deficits: Secondary | ICD-10-CM

## 2018-09-28 DIAGNOSIS — N3281 Overactive bladder: Secondary | ICD-10-CM

## 2018-09-28 DIAGNOSIS — I10 Essential (primary) hypertension: Secondary | ICD-10-CM

## 2018-09-28 DIAGNOSIS — Z23 Encounter for immunization: Secondary | ICD-10-CM | POA: Diagnosis not present

## 2018-09-28 DIAGNOSIS — E039 Hypothyroidism, unspecified: Secondary | ICD-10-CM | POA: Diagnosis not present

## 2018-09-28 DIAGNOSIS — E119 Type 2 diabetes mellitus without complications: Secondary | ICD-10-CM

## 2018-09-28 LAB — POCT URINALYSIS DIP (MANUAL ENTRY)
Bilirubin, UA: NEGATIVE
Blood, UA: NEGATIVE
Glucose, UA: NEGATIVE mg/dL
Ketones, POC UA: NEGATIVE mg/dL
Leukocytes, UA: NEGATIVE
Nitrite, UA: NEGATIVE
Protein Ur, POC: NEGATIVE mg/dL
Spec Grav, UA: 1.02 (ref 1.010–1.025)
Urobilinogen, UA: 0.2 E.U./dL
pH, UA: 5.5 (ref 5.0–8.0)

## 2018-09-28 NOTE — Patient Instructions (Addendum)
  Based on history obtained today and review of labs/exam I do not see any increased risk of major adverse cardiac event for your planned surgery.  I can fill out paperwork once it is sent and I have reviewed the other studies from today.  Let me know if there are questions in the meantime, and follow-up in the next few months to discuss your current medications and bone density screening at that time.  Take care.    If you have lab work done today you will be contacted with your lab results within the next 2 weeks.  If you have not heard from [place] then please contact us. The fastest way to get your results is to register for My Chart.   IF you received an x-ray today, you will receive an invoice from [facility]. Please contact [facility] at [phone] with questions or concerns regarding your invoice.   IF you received labwork today, you will receive an invoice from [facility]. Please contact [facility] at [phone] with questions or concerns regarding your invoice.   Our billing staff will not be able to assist you with questions regarding bills from these companies.  You will be contacted with the lab results as soon as they are available. The fastest way to get your results is to activate your My Chart account. Instructions are located on the last page of this paperwork. If you have not heard from [place] regarding the results in 2 weeks, please contact this office.

## 2018-09-28 NOTE — Progress Notes (Signed)
Subjective:    Patient ID: Cheryl Cooper, female    DOB: 06-20-1940, 78 y.o.   MRN: [id number]  HPI Cheryl Cooper is a 78 y.o. female Presents today for: Chief Complaint  Patient presents with  . Left shoulder Replacement Clearance    Dr. Veverly Fells    Here for preop clearance for L shoulder replacement, Dr. Veverly Fells. Unknown date, but met with orhto last week.  She did have surgery for cholecystectomy in February, neurosurgery (lumbar laminectomy) in 12/2017.   History of  HTN, hypothyroidism, HLD, diabetes, overactive bladder, remote hx of brainstem strokes.   Diabetes - A1c 6.4 in June - followed at [facility] endocrinology [place]. POC glucose 166. Blood sugar 117 this mam - diet controlled.   Hypothyroidism - last tsh 3.6  In June. - followed by endocrine.  On meds.   Hypertension: BP Readings from Last 3 Encounters:  09/28/18 123/67  08/12/18 124/70  07/21/18 116/61   Lab Results  Component Value Date   CREATININE 0.90 07/21/2018   No difficulty with anesthesia with prior surgeries.   No chest pain or dyspnea with exercise (water aerobics). No dyspnea/CO with walking up stairs to attic or basement (14 stairs).  No known hx of heart disease.   No known hx of OSA. No known hist of bleeding ulcer.  Non smoker. No known COPD, asthma or chronic lung disease.   Overactive bladder controlled with Ditropan XL 32m - on new lightheadedness or other new side effects.   Trazodone to sleep. Pneumovax updated today.  Plan on repeat Dexa later this year.   CMP with albumin 4.0 07/21/18.   Immunization History  Administered Date(s) Administered  . Influenza, High Dose Seasonal PF 11/27/2015, 12/31/2016  . Influenza,inj,quad, With Preservative 11/18/2016  . Influenza-Unspecified 12/31/2006, 11/19/2008, 11/06/2011, 11/17/2013, 11/12/2017  . Pneumococcal Conjugate-13 08/12/2013, 09/18/2017  . Pneumococcal Polysaccharide-23 01/30/2007, 09/28/2018  .  Pneumococcal-Unspecified 07/19/2016  . Td 09/10/2008  . Tdap 11/12/2017  . Zoster 11/17/2007  . Zoster Recombinat (Shingrix) 07/11/2016, 10/09/2016     Patient Active Problem List   Diagnosis Date Noted  . PUD (peptic ulcer disease) 03/21/2018  . Nausea and vomiting 01/11/2018  . Synovial cyst of lumbar facet joint 12/23/2017  . Osteoarthritis of right hip 04/24/2017  . Primary osteoarthritis of right hip 04/24/2017  . Complete rotator cuff tear 05/09/2016  . [place]  . Dizziness and giddiness 01/04/2015  . Post concussion syndrome 01/04/2015  . Rotator cuff tear, right 11/21/2011   Past Medical History:  Diagnosis Date  . Anxiety   . Carpal tunnel syndrome   . Deafness in left ear   . Depression   . Diabetes mellitus    diet controlled  . GERD (gastroesophageal reflux disease)   . Hypercholesteremia   . Hypertension   . Hypothyroid   . Memory loss    reports d/t concussion  . PONV (postoperative nausea and vomiting) yrs ago, none recent  . Post concussion syndrome   . Stroke ([facility]    reports they were silent   Past Surgical History:  Procedure Laterality Date  . ABDOMINAL HYSTERECTOMY  1986  . BACK SURGERY  2010   lower  . BIOPSY  01/12/2018   Procedure: BIOPSY;  Surgeon: MRush LandmarkGTelford Nab, MD;  [facility];  Service: Gastroenterology;;  . CHOLECYSTECTOMY    . ESOPHAGOGASTRODUODENOSCOPY (EGD) WITH PROPOFOL N/A 01/12/2018   Procedure: ESOPHAGOGASTRODUODENOSCOPY (EGD) WITH PROPOFOL;  Surgeon: MIrving Copas, MD;  [facility]  [facility];  Service: Gastroenterology;  Laterality: N/A;  . left elobow surgery  2003  . left rotator cuff  2001  . LUMBAR LAMINECTOMY/DECOMPRESSION MICRODISCECTOMY Left 12/23/2017   Procedure: Laminectomy for facet/synovial cyst - left - Lumbar three-Lumbar four;  Surgeon: Earnie Larsson, MD;  Location: [place];  Service: Neurosurgery;  Laterality: Left;  . r foot surgery  1995  . right hand  surgery  2001  . right index finger surgery  2009  . SHOULDER OPEN ROTATOR CUFF REPAIR  11/21/2011   Procedure: ROTATOR CUFF REPAIR SHOULDER OPEN;  Surgeon: Johnn Hai, MD;  [facility];  Service: Orthopedics;  Laterality: Right;  with subacromial decompression  . SHOULDER OPEN ROTATOR CUFF REPAIR Right 05/09/2016   Procedure: Right shoulder mini open revision rotator cuff repair, subacromial decompression;  Surgeon: Susa Day, MD;  [facility];  Service: Orthopedics;  Laterality: Right;  . TOTAL HIP ARTHROPLASTY Right 04/24/2017   Procedure: RIGHT TOTAL HIP ARTHROPLASTY ANTERIOR APPROACH;  Surgeon: Rod Can, MD;  [facility];  Service: Orthopedics;  Laterality: Right;  Needs RNFA   Allergies  Allergen Reactions  . Other Other (See Comments)    Tomato sauce, garlic, onion - severe acid reflux   . Flexeril [Cyclobenzaprine] Other (See Comments)    Per spouse "she felt like a zombie"  . Atorvastatin Other (See Comments)    Unbalanced  . Chlordiazepoxide-Clidinium Other (See Comments)    Dizziness (intolerance)  . Codeine Nausea Only   Prior to Admission medications   Medication Sig Start Date End Date Taking? Authorizing Provider  alendronate (FOSAMAX) 70 MG tablet Take 70 mg by mouth every Monday. Take with a full glass of water on an empty stomach.    Yes [provider]  amLODipine (NORVASC) 5 MG tablet Take 1 tablet (5 mg total) by mouth daily. 07/21/18  Yes Wendie Agreste, MD  aspirin 81 MG chewable tablet Chew 1 tablet (81 mg total) by mouth 2 (two) times daily. Patient taking differently: Chew 81 mg by mouth 2 (two) times a week.  04/25/17  Yes Swinteck, Aaron Edelman, MD  Calcium Carbonate-Vitamin D (CALCIUM-D PO) Take 2 tablets by mouth daily.   Yes [provider]  escitalopram (LEXAPRO) 10 MG tablet Take 10 mg by mouth daily.  09/03/14  Yes [provider]  esomeprazole (NEXIUM) 40 MG capsule Take 1 capsule (40 mg total) by mouth 2  (two) times daily before a meal. 01/13/18  Yes Mikhail, [place], DO  ezetimibe (ZETIA) 10 MG tablet Take 1 tablet (10 mg total) by mouth daily. 07/21/18  Yes Wendie Agreste, MD  levothyroxine (SYNTHROID) 75 MCG tablet Take 1 tablet (75 mcg total) by mouth daily with breakfast. 07/21/18  Yes Wendie Agreste, MD  oxybutynin (DITROPAN XL) 15 MG 24 hr tablet Take 1 tablet (15 mg total) by mouth at bedtime. 07/21/18  Yes Wendie Agreste, MD  simvastatin (ZOCOR) 20 MG tablet Take 1 tablet (20 mg total) by mouth at bedtime. 07/21/18  Yes Wendie Agreste, MD  SUPER B COMPLEX/C PO Take 1 tablet by mouth daily.   Yes [provider]  traZODone (DESYREL) 50 MG tablet Take 50 mg by mouth at bedtime.   Yes [provider]   Social History   Socioeconomic History  . Marital status: Married    Spouse name: Cheryl Cooper  . Number of children: 3  . Years of education: 10  . Highest education level: Not on file  Occupational  History    Comment: retired  Scientific laboratory technician  . Financial resource strain: Not on file  . Food insecurity    Worry: Not on file    Inability: Not on file  . Transportation needs    Medical: Not on file    Non-medical: Not on file  Tobacco Use  . Smoking status: Never Smoker  . Smokeless tobacco: Never Used  Substance and Sexual Activity  . Alcohol use: Yes    Alcohol/week: 4.0 standard drinks    Types: 4 Glasses of wine per week  . Drug use: No  . Sexual activity: Not Currently    Comment: 1st intercourse 78 yo-Fewer than 5 partners  Lifestyle  . Physical activity    Days per week: Not on file    Minutes per session: Not on file  . Stress: Not on file  Relationships  . Social Herbalist on phone: Not on file    Gets together: Not on file    Attends religious service: Not on file    Active member of club or organization: Not on file    Attends meetings of clubs or organizations: Not on file    Relationship status: Not on file  . Intimate  partner violence    Fear of current or ex partner: Not on file    Emotionally abused: Not on file    Physically abused: Not on file    Forced sexual activity: Not on file  Other Topics Concern  . Not on file  Social History Narrative   Married, lives at home with husband   caffeine - 1 Coke daily    Review of Systems     Objective:   Physical Exam Vitals signs reviewed.  Constitutional:      Appearance: She is well-developed.  HENT:     Head: Normocephalic and atraumatic.  Eyes:     Conjunctiva/sclera: Conjunctivae normal.     Pupils: Pupils are equal, round, and reactive to light.  Neck:     Vascular: No carotid bruit.  Cardiovascular:     Rate and Rhythm: Normal rate and regular rhythm.     Heart sounds: Normal heart sounds.  Pulmonary:     Effort: Pulmonary effort is normal.     Breath sounds: Normal breath sounds.  Abdominal:     Palpations: Abdomen is soft. There is no pulsatile mass.     Tenderness: There is no abdominal tenderness.  Skin:    General: Skin is warm and dry.  Neurological:     Mental Status: She is alert and oriented to person, place, and time.  Psychiatric:        Behavior: Behavior normal.    Vitals:   09/28/18 1009  BP: 123/67  Pulse: 68  Temp: 98.3 F (36.8 C)  TempSrc: Oral  SpO2: 94%  Weight: 144 lb 6.4 oz (65.5 kg)  Height: 5' 3"  (1.6 m)   Dg Chest 2 View  Result Date: 09/28/2018 CLINICAL DATA:  Preoperative clearance. EXAM: CHEST - 2 VIEW COMPARISON:  None. FINDINGS: Heart size is normal. Atherosclerotic changes are noted at the aortic arch. Lungs are hyperexpanded. There is increased lucency. No focal airspace disease is present. Is no edema or effusion. Postsurgical changes are noted at the right shoulder. IMPRESSION: 1. No acute cardiopulmonary disease. 2. Changes of COPD. 3. Aortic atherosclerosis. Electronically Signed   By: San Morelle M.D.   On: 09/28/2018 12:37     EKG: sinus bradycardia, rate 54,  no acute  findings.  Not on betablocker. Rate 62 on 12/2017 ekg.     Assessment & Plan:  MANASVINI WHATLEY is a 78 y.o. female History of CVA in adulthood - Plan: EKG 12-Lead  Preoperative clearance - Plan: CBC, POCT urinalysis dipstick, DG Chest 2 View, Protime-INR  Type 2 diabetes, diet controlled (HCC)  Hypothyroidism, unspecified type  Overactive bladder  Screening, anemia, deficiency, iron  Essential hypertension  Preoperative clearance.  Able to achieve 6 METs activity without chest pain or dyspnea.  No known cardiac history.  Blood pressure, diabetes, hypothyroidism controlled.  Recent CMP with normal abdomen and up-to-date on hemoglobin A1c from June.  Sinus bradycardia on EKG, minimal decreased from prior EKG without acute findings.   -Check chest x-ray, PT/INR, CBC for standard preoperative labs then can complete paperwork for her surgeon.  Appears to be low risk, without apparent increased risk of major adverse cardiac event based on history and exam today.   Reviewed her immunizations with her, Pneumovax 23 updated, she is updated on shingles vaccine.  Plan for bone density next few months as not due till September.  No orders of the defined types were placed in this encounter.  Patient Instructions    Based on history obtained today and review of labs/exam I do not see any increased risk of major adverse cardiac event for your planned surgery.  I can fill out paperwork once it is sent and I have reviewed the other studies from today.  Let me know if there are questions in the meantime, and follow-up in the next few months to discuss your current medications and bone density screening at that time.  Take care.    If you have lab work done today you will be contacted with your lab results within the next 2 weeks.  If you have not heard from [place] then please contact us. The fastest way to get your results is to register for My Chart.   IF you received an x-ray today, you will receive  an invoice from [facility]. Please contact [facility] at [phone] with questions or concerns regarding your invoice.   IF you received labwork today, you will receive an invoice from [facility]. Please contact [facility] at [phone] with questions or concerns regarding your invoice.   Our billing staff will not be able to assist you with questions regarding bills from these companies.  You will be contacted with the lab results as soon as they are available. The fastest way to get your results is to activate your My Chart account. Instructions are located on the last page of this paperwork. If you have not heard from [place] regarding the results in 2 weeks, please contact this office.       Signed,   Merri Ray, MD [facility].  09/28/18 12:15 PM

## 2018-09-29 LAB — CBC
Hematocrit: 40.8 % (ref 34.0–46.6)
Hemoglobin: 13.1 g/dL (ref 11.1–15.9)
MCH: 27.2 pg (ref 26.6–33.0)
MCHC: 32.1 g/dL (ref 31.5–35.7)
MCV: 85 fL (ref 79–97)
Platelets: 260 10*3/uL (ref 150–450)
RBC: 4.82 x10E6/uL (ref 3.77–5.28)
RDW: 14.2 % (ref 11.7–15.4)
WBC: 7.3 10*3/uL (ref 3.4–10.8)

## 2018-09-29 LAB — PROTIME-INR
INR: 1 (ref 0.8–1.2)
Prothrombin Time: 10.9 s (ref 9.1–12.0)

## 2018-10-05 ENCOUNTER — Ambulatory Visit (INDEPENDENT_AMBULATORY_CARE_PROVIDER_SITE_OTHER): Payer: Medicare Other | Admitting: Family Medicine

## 2018-10-05 VITALS — BP 123/67 | Ht 63.5 in | Wt 144.0 lb

## 2018-10-05 DIAGNOSIS — Z0000 Encounter for general adult medical examination without abnormal findings: Secondary | ICD-10-CM | POA: Diagnosis not present

## 2018-10-05 NOTE — Progress Notes (Signed)
Presents today for [employer] Visit   Date of last exam: 09/28/2018  Interpreter used for this visit? No  I connected with  Cheryl Cooper on 10/05/18 by a telephone  and verified that I am speaking with the correct person using two identifiers.      Patient Care Team: Wendie Agreste, MD as PCP - General (Family Medicine)   Other items to address today:   Discussed immunizations Discussed Eye/Dental Patient was just seen for surgical clearance for shoulder surgery.  Waiting on date.   Other Screening: Last screening for diabetes: 01/11/2018 Last lipid screening: 07/21/2018  ADVANCE DIRECTIVES: Discussed: yes On File: yes Materials Provided: no  Immunization status:  Immunization History  Administered Date(s) Administered  . Influenza, High Dose Seasonal PF 11/27/2015, 12/31/2016  . Influenza,inj,quad, With Preservative 11/18/2016  . Influenza-Unspecified 12/31/2006, 11/19/2008, 11/06/2011, 11/17/2013, 11/12/2017  . Pneumococcal Conjugate-13 08/12/2013, 09/18/2017  . Pneumococcal Polysaccharide-23 01/30/2007, 09/28/2018  . Pneumococcal-Unspecified 07/19/2016  . Td 09/10/2008  . Tdap 11/12/2017  . Zoster 11/17/2007  . Zoster Recombinat (Shingrix) 07/11/2016, 10/09/2016     Health Maintenance Due  Topic Date Due  . INFLUENZA VACCINE  09/19/2018     Functional Status Survey: Is the patient deaf or have difficulty hearing?: No Does the patient have difficulty seeing, even when wearing glasses/contacts?: No Does the patient have difficulty concentrating, remembering, or making decisions?: No Does the patient have difficulty walking or climbing stairs?: No Does the patient have difficulty dressing or bathing?: No Does the patient have difficulty doing errands alone such as visiting a doctor's office or shopping?: No   6CIT Screen 10/05/2018 09/18/2017  What Year? 0 points 0 points  What month? 0 points 0 points  What time? 0 points 0  points  Count back from 20 0 points 2 points  Months in reverse 0 points 2 points  Repeat phrase 0 points 4 points  Total Score 0 8        Clinical Support from 10/05/2018 in [facility]  AUDIT-C Score  2       Home Environment:   Live in one story home with husband and dog No trouble climbing stairs No scattered rugs Yes grab bars Free clutter/adequate lighting.   Patient Active Problem List   Diagnosis Date Noted  . PUD (peptic ulcer disease) 03/21/2018  . Nausea and vomiting 01/11/2018  . Synovial cyst of lumbar facet joint 12/23/2017  . Osteoarthritis of right hip 04/24/2017  . Primary osteoarthritis of right hip 04/24/2017  . Complete rotator cuff tear 05/09/2016  . Brainstem stroke ([place]) 01/04/2015  . Dizziness and giddiness 01/04/2015  . Post concussion syndrome 01/04/2015  . Rotator cuff tear, right 11/21/2011     Past Medical History:  Diagnosis Date  . Anxiety   . Carpal tunnel syndrome   . Deafness in left ear   . Depression   . Diabetes mellitus    diet controlled  . GERD (gastroesophageal reflux disease)   . Hypercholesteremia   . Hypertension   . Hypothyroid   . Memory loss    reports d/t concussion  . PONV (postoperative nausea and vomiting) yrs ago, none recent  . Post concussion syndrome   . Stroke [facility])    reports they were silent     Past Surgical History:  Procedure Laterality Date  . ABDOMINAL HYSTERECTOMY  1986  . BACK SURGERY  2010   lower  . BIOPSY  01/12/2018   Procedure:  BIOPSY;  Surgeon: Irving Copas., MD;  [facility];  Service: Gastroenterology;;  . [facility]    . ESOPHAGOGASTRODUODENOSCOPY (EGD) WITH PROPOFOL N/A 01/12/2018   Procedure: ESOPHAGOGASTRODUODENOSCOPY (EGD) WITH PROPOFOL;  Surgeon: Rush Landmark Telford Nab., MD;  [facility];  Service: Gastroenterology;  Laterality: N/A;  . left elobow surgery  2003  . left rotator cuff  2001  . LUMBAR LAMINECTOMY/DECOMPRESSION  MICRODISCECTOMY Left 12/23/2017   Procedure: Laminectomy for facet/synovial cyst - left - Lumbar three-Lumbar four;  Surgeon: Earnie Larsson, MD;  Location: [place];  Service: Neurosurgery;  Laterality: Left;  . r foot surgery  1995  . right hand surgery  2001  . right index finger surgery  2009  . SHOULDER OPEN ROTATOR CUFF REPAIR  11/21/2011   Procedure: ROTATOR CUFF REPAIR SHOULDER OPEN;  Surgeon: Johnn Hai, MD;  [facility];  Service: Orthopedics;  Laterality: Right;  with subacromial decompression  . SHOULDER OPEN ROTATOR CUFF REPAIR Right 05/09/2016   Procedure: Right shoulder mini open revision rotator cuff repair, subacromial decompression;  Surgeon: Susa Day, MD;  [facility];  Service: Orthopedics;  Laterality: Right;  . TOTAL HIP ARTHROPLASTY Right 04/24/2017   Procedure: RIGHT TOTAL HIP ARTHROPLASTY ANTERIOR APPROACH;  Surgeon: Rod Can, MD;  [facility];  Service: Orthopedics;  Laterality: Right;  Needs RNFA     Family History  Problem Relation Age of Onset  . Stroke Father   . Stroke Sister   . Stroke Brother   . Diabetes Brother   . Dementia Brother   . Stroke Sister        TIA     Social History   Socioeconomic History  . Marital status: Married    Spouse name: Herschel  . Number of children: 3  . Years of education: 56  . Highest education level: Not on file  Occupational History    Comment: retired  Scientific laboratory technician  . Financial resource strain: Not on file  . Food insecurity    Worry: Not on file    Inability: Not on file  . Transportation needs    Medical: Not on file    Non-medical: Not on file  Tobacco Use  . Smoking status: Never Smoker  . Smokeless tobacco: Never Used  Substance and Sexual Activity  . Alcohol use: Yes    Alcohol/week: 4.0 standard drinks    Types: 4 Glasses of wine per week  . Drug use: No  . Sexual activity: Not Currently    Comment: 1st intercourse 78 yo-Fewer than 5 partners  Lifestyle  . Physical  activity    Days per week: Not on file    Minutes per session: Not on file  . Stress: Not on file  Relationships  . Social Herbalist on phone: Not on file    Gets together: Not on file    Attends religious service: Not on file    Active member of club or organization: Not on file    Attends meetings of clubs or organizations: Not on file    Relationship status: Not on file  . Intimate partner violence    Fear of current or ex partner: Not on file    Emotionally abused: Not on file    Physically abused: Not on file    Forced sexual activity: Not on file  Other Topics Concern  . Not on file  Social History Narrative   Married, lives at home with husband   caffeine -  1 Coke daily     Allergies  Allergen Reactions  . Other Other (See Comments)    Tomato sauce, garlic, onion - severe acid reflux   . Flexeril [Cyclobenzaprine] Other (See Comments)    Per spouse "she felt like a zombie"  . Atorvastatin Other (See Comments)    Unbalanced  . Chlordiazepoxide-Clidinium Other (See Comments)    Dizziness (intolerance)  . Codeine Nausea Only     Prior to Admission medications   Medication Sig Start Date End Date Taking? Authorizing Provider  alendronate (FOSAMAX) 70 MG tablet Take 70 mg by mouth every Monday. Take with a full glass of water on an empty stomach.    Yes [provider]  amLODipine (NORVASC) 5 MG tablet Take 1 tablet (5 mg total) by mouth daily. 07/21/18  Yes Wendie Agreste, MD  aspirin 81 MG chewable tablet Chew 1 tablet (81 mg total) by mouth 2 (two) times daily. Patient taking differently: Chew 81 mg by mouth 2 (two) times a week.  04/25/17  Yes Swinteck, Aaron Edelman, MD  Calcium Carbonate-Vitamin D (CALCIUM-D PO) Take 2 tablets by mouth daily.   Yes [provider]  escitalopram (LEXAPRO) 10 MG tablet Take 10 mg by mouth daily.  09/03/14  Yes [provider]  esomeprazole (NEXIUM) 40 MG capsule Take 1 capsule (40 mg total) by mouth 2  (two) times daily before a meal. 01/13/18  Yes Mikhail, [place], DO  ezetimibe (ZETIA) 10 MG tablet Take 1 tablet (10 mg total) by mouth daily. 07/21/18  Yes Wendie Agreste, MD  levothyroxine (SYNTHROID) 75 MCG tablet Take 1 tablet (75 mcg total) by mouth daily with breakfast. 07/21/18  Yes Wendie Agreste, MD  oxybutynin (DITROPAN XL) 15 MG 24 hr tablet Take 1 tablet (15 mg total) by mouth at bedtime. 07/21/18  Yes Wendie Agreste, MD  simvastatin (ZOCOR) 20 MG tablet Take 1 tablet (20 mg total) by mouth at bedtime. 07/21/18  Yes Wendie Agreste, MD  SUPER B COMPLEX/C PO Take 1 tablet by mouth daily.   Yes [provider]  traZODone (DESYREL) 50 MG tablet Take 50 mg by mouth at bedtime.   Yes [provider]     Depression screen [facility] 2/9 10/05/2018 09/28/2018 07/21/2018 05/22/2018 03/03/2018  Decreased Interest 0 0 0 0 0  Down, Depressed, Hopeless 0 0 0 0 0  PHQ - 2 Score 0 0 0 0 0     Fall Risk  10/05/2018 09/28/2018 07/21/2018 05/22/2018 03/03/2018  Falls in the past year? 0 0 0 0 0  Number falls in past yr: 0 0 0 - -  Injury with Fall? 0 0 0 - -  Comment - - - - -  Risk for fall due to : - - - - -  Risk for fall due to: Comment - - - - -  Follow up Falls evaluation completed;Education provided;Falls prevention discussed Falls evaluation completed Falls evaluation completed - -      PHYSICAL EXAM: BP 123/67 Comment: taken from last visit  Ht 5' 3.5" (1.613 m)   Wt 144 lb (65.3 kg)   BMI 25.11 kg/m    Wt Readings from Last 3 Encounters:  10/05/18 144 lb (65.3 kg)  09/28/18 144 lb 6.4 oz (65.5 kg)  08/12/18 143 lb (64.9 kg)     No exam data present    Physical Exam   Education/Counseling provided regarding diet and exercise, prevention of chronic diseases, smoking/tobacco cessation, if applicable, and  reviewed "Covered Medicare Preventive Services."

## 2018-10-05 NOTE — Patient Instructions (Signed)
Thank you for taking time to come for your Medicare Wellness Visit. I appreciate your ongoing commitment to your health goals. Please review the following plan we discussed and let me know if I can assist you in the future.  Cordera Stineman LPN  Preventive Care 76 Years and Older, Female Preventive care refers to lifestyle choices and visits with your health care provider that can promote health and wellness. This includes:  A yearly physical exam. This is also called an annual well check.  Regular dental and eye exams.  Immunizations.  Screening for certain conditions.  Healthy lifestyle choices, such as diet and exercise. What can I expect for my preventive care visit? Physical exam Your health care provider will check:  Height and weight. These may be used to calculate body mass index (BMI), which is a measurement that tells if you are at a healthy weight.  Heart rate and blood pressure.  Your skin for abnormal spots. Counseling Your health care provider may ask you questions about:  Alcohol, tobacco, and drug use.  Emotional well-being.  Home and relationship well-being.  Sexual activity.  Eating habits.  History of falls.  Memory and ability to understand (cognition).  Work and work environment.  Pregnancy and menstrual history. What immunizations do I need?  Influenza (flu) vaccine  This is recommended every year. Tetanus, diphtheria, and pertussis (Tdap) vaccine  You may need a Td booster every 10 years. Varicella (chickenpox) vaccine  You may need this vaccine if you have not already been vaccinated. Zoster (shingles) vaccine  You may need this after age 60. Pneumococcal conjugate (PCV13) vaccine  One dose is recommended after age 76. Pneumococcal polysaccharide (PPSV23) vaccine  One dose is recommended after age 76. Measles, mumps, and rubella (MMR) vaccine  You may need at least one dose of MMR if you were born in 1957 or later. You may also  need a second dose. Meningococcal conjugate (MenACWY) vaccine  You may need this if you have certain conditions. Hepatitis A vaccine  You may need this if you have certain conditions or if you travel or work in places where you may be exposed to hepatitis A. Hepatitis B vaccine  You may need this if you have certain conditions or if you travel or work in places where you may be exposed to hepatitis B. Haemophilus influenzae type b (Hib) vaccine  You may need this if you have certain conditions. You may receive vaccines as individual doses or as more than one vaccine together in one shot (combination vaccines). Talk with your health care provider about the risks and benefits of combination vaccines. What tests do I need? Blood tests  Lipid and cholesterol levels. These may be checked every 5 years, or more frequently depending on your overall health.  Hepatitis C test.  Hepatitis B test. Screening  Lung cancer screening. You may have this screening every year starting at age 76 if you have a 30-pack-year history of smoking and currently smoke or have quit within the past 15 years.  Colorectal cancer screening. All adults should have this screening starting at age 76 and continuing until age 76. Your health care provider may recommend screening at age 45 if you are at increased risk. You will have tests every 1-10 years, depending on your results and the type of screening test.  Diabetes screening. This is done by checking your blood sugar (glucose) after you have not eaten for a while (fasting). You may have this done every 1-3   years.  Mammogram. This may be done every 1-2 years. Talk with your health care provider about how often you should have regular mammograms.  BRCA-related cancer screening. This may be done if you have a family history of breast, ovarian, tubal, or peritoneal cancers. Other tests  Sexually transmitted disease (STD) testing.  Bone density scan. This is done  to screen for osteoporosis. You may have this done starting at age 76. Follow these instructions at home: Eating and drinking  Eat a diet that includes fresh fruits and vegetables, whole grains, lean protein, and low-fat dairy products. Limit your intake of foods with high amounts of sugar, saturated fats, and salt.  Take vitamin and mineral supplements as recommended by your health care provider.  Do not drink alcohol if your health care provider tells you not to drink.  If you drink alcohol: ? Limit how much you have to 0-1 drink a day. ? Be aware of how much alcohol is in your drink. In the [place] one drink equals one 12 oz bottle of beer (355 mL), one 5 oz glass of wine (148 mL), or one 1 oz glass of hard liquor (44 mL). Lifestyle  Take daily care of your teeth and gums.  Stay active. Exercise for at least 30 minutes on 5 or more days each week.  Do not use any products that contain nicotine or tobacco, such as cigarettes, e-cigarettes, and chewing tobacco. If you need help quitting, ask your health care provider.  If you are sexually active, practice safe sex. Use a condom or other form of protection in order to prevent STIs (sexually transmitted infections).  Talk with your health care provider about taking a low-dose aspirin or statin. What's next?  Go to your health care provider once a year for a well check visit.  Ask your health care provider how often you should have your eyes and teeth checked.  Stay up to date on all vaccines. This information is not intended to replace advice given to you by your health care provider. Make sure you discuss any questions you have with your health care provider. Document Released: 03/03/2015 Document Revised: 01/29/2018 Document Reviewed: 01/29/2018 Elsevier Patient Education  2020 [employer].

## 2018-10-07 ENCOUNTER — Telehealth: Payer: Self-pay | Admitting: Family Medicine

## 2018-10-07 NOTE — Telephone Encounter (Signed)
Copied from [place] [phone]. Topic: General - Other >> Oct 07, 2018 12:11 PM Celene Kras A wrote: Reason for CRM: Pt called stating [facility] has faxed over paper work for pts shoulder replacement surgery. Please advise. Fax # [phone]

## 2018-10-09 NOTE — Telephone Encounter (Signed)
Form completed, ultimately neurology should decide on aspirin use/holding perioperatively.  History of brainstem stroke.  Form completed and placed in fax box.

## 2018-10-12 NOTE — Telephone Encounter (Signed)
Spoke with pt advised form completed and faxed.  Advised per [facility] neurology should decide on aspirin/holding perioperatively.  Pt agreeable an advises she doesn't hardly ever take asa and is taking tylenol currently. Dgaddy, CMA

## 2018-10-20 ENCOUNTER — Encounter (HOSPITAL_COMMUNITY): Payer: Self-pay

## 2018-10-20 ENCOUNTER — Other Ambulatory Visit (HOSPITAL_COMMUNITY)
Admission: RE | Admit: 2018-10-20 | Discharge: 2018-10-20 | Disposition: A | Discharge status: Home / Self Care | Payer: Medicare Other | Source: Ambulatory Visit | Attending: Orthopedic Surgery | Admitting: Orthopedic Surgery

## 2018-10-20 LAB — SARS CORONAVIRUS 2 (TAT 6-24 HRS): SARS Coronavirus 2: NEGATIVE

## 2018-10-20 NOTE — Patient Instructions (Addendum)
DUE TO COVID-19 ONLY ONE VISITOR IS ALLOWED TO COME WITH YOU AND STAY IN THE WAITING ROOM ONLY DURING PRE OP AND PROCEDURE DAY OF SURGERY. THE 1 VISITOR MAY VISIT WITH YOU AFTER SURGERY IN YOUR PRIVATE ROOM DURING VISITING HOURS ONLY!  YOU NEED TO HAVE A COVID 19 TEST ON 10-20-2018. ONCE YOUR COVID TEST IS COMPLETED, PLEASE BEGIN THE QUARANTINE INSTRUCTIONS AS OUTLINED IN YOUR HANDOUT.                Cheryl Cooper    Your procedure is scheduled on: 10-23-2018   Report to [facility] Main  Entrance   Report to SHORT STAY at 530  AM     Call this number if you have problems the morning of surgery [phone]    Remember:  [place], NO CHEWING GUM [place].   NO SOLID FOOD AFTER MIDNIGHT THE NIGHT PRIOR TO SURGERY. NOTHING BY MOUTH EXCEPT CLEAR LIQUIDS UNTIL 430 AM . PLEASE FINISH  G2  DRINK PER SURGEON ORDER  WHICH NEEDS TO BE COMPLETED AT 430 AM .   CLEAR LIQUID DIET   Foods Allowed                                                                     Foods Excluded  Coffee and tea, regular and decaf                             liquids that you cannot  Plain Jell-O any favor except red or purple                                           see through such as: Fruit ices (not with fruit pulp)                                     milk, soups, orange juice  Iced Popsicles                                    All solid food Carbonated beverages, regular and diet                                    Cranberry, grape and apple juices Sports drinks like Gatorade Lightly seasoned clear broth or consume(fat free) Sugar, honey syrup   _____________________________________________________________________     Take these medicines the morning of surgery with A SIP OF WATER: ESCITALOPRAM (LEXAPRO), ZETIA, AMLODIPINE (NORVASC), LEVOTHYROXINE (SYNTHROID), NEXIUM                                You may not have any metal on your body  including hair pins and  piercings  Do not wear jewelry, make-up, lotions, powders or perfumes, deodorant             Do not wear nail polish.  Do not shave  48 hours prior to surgery.                Do not bring valuables to the hospital. [place].  Contacts, dentures or bridgework may not be worn into surgery.  Leave suitcase in the car. After surgery it may be brought to your room.                  Please read over the following fact sheets you were given: _____________________________________________________________________             [address] [facility] - Preparing for Surgery Before surgery, you can play an important role.  Because skin is not sterile, your skin needs to be as free of germs as possible.  You can reduce the number of germs on your skin by washing with CHG (chlorahexidine gluconate) soap before surgery.  CHG is an antiseptic cleaner which kills germs and bonds with the skin to continue killing germs even after washing. Please DO NOT use if you have an allergy to CHG or antibacterial soaps.  If your skin becomes reddened/irritated stop using the CHG and inform your nurse when you arrive at Short Stay. Do not shave (including legs and underarms) for at least 48 hours prior to the first CHG shower.  You may shave your face/neck. Please follow these instructions carefully:  1.  Shower with CHG Soap the night before surgery and the  morning of Surgery.  2.  If you choose to wash your hair, wash your hair first as usual with your  normal  shampoo.  3.  After you shampoo, rinse your hair and body thoroughly to remove the  shampoo.                           4.  Use CHG as you would any other liquid soap.  You can apply chg directly  to the skin and wash                       Gently with a scrungie or clean washcloth.  5.  Apply the CHG Soap to your body ONLY FROM THE NECK DOWN.   Do not use on face/ open                            Wound or open sores. Avoid contact with eyes, ears mouth and genitals (private parts).                       Wash face,  Genitals (private parts) with your normal soap.             6.  Wash thoroughly, paying special attention to the area where your surgery  will be performed.  7.  Thoroughly rinse your body with warm water from the neck down.  8.  DO NOT shower/wash with your normal soap after using and rinsing off  the CHG Soap.                9.  Pat yourself dry with a clean towel.  10.  Wear clean pajamas.            11.  Place clean sheets on your bed the night of your first shower and do not  sleep with pets. Day of Surgery : Do not apply any lotions/deodorants the morning of surgery.  Please wear clean clothes to the hospital/surgery center.  FAILURE TO FOLLOW THESE INSTRUCTIONS MAY RESULT IN THE CANCELLATION OF YOUR SURGERY PATIENT SIGNATURE_________________________________  NURSE SIGNATURE__________________________________  ________________________________________________________________________   Cheryl Cooper  An incentive spirometer is a tool that can help keep your lungs clear and active. This tool measures how well you are filling your lungs with each breath. Taking long deep breaths may help reverse or decrease the chance of developing breathing (pulmonary) problems (especially infection) following:  A long period of time when you are unable to move or be active. BEFORE THE PROCEDURE   If the spirometer includes an indicator to show your best effort, your nurse or respiratory therapist will set it to a desired goal.  If possible, sit up straight or lean slightly forward. Try not to slouch.  Hold the incentive spirometer in an upright position. INSTRUCTIONS FOR USE  1. Sit on the edge of your bed if possible, or sit up as far as you can in bed or on a chair. 2. Hold the incentive spirometer in an upright position. 3. Breathe out  normally. 4. Place the mouthpiece in your mouth and seal your lips tightly around it. 5. Breathe in slowly and as deeply as possible, raising the piston or the ball toward the top of the column. 6. Hold your breath for 3-5 seconds or for as long as possible. Allow the piston or ball to fall to the bottom of the column. 7. Remove the mouthpiece from your mouth and breathe out normally. 8. Rest for a few seconds and repeat Steps 1 through 7 at least 10 times every 1-2 hours when you are awake. Take your time and take a few normal breaths between deep breaths. 9. The spirometer may include an indicator to show your best effort. Use the indicator as a goal to work toward during each repetition. 10. After each set of 10 deep breaths, practice coughing to be sure your lungs are clear. If you have an incision (the cut made at the time of surgery), support your incision when coughing by placing a pillow or rolled up towels firmly against it. Once you are able to get out of bed, walk around indoors and cough well. You may stop using the incentive spirometer when instructed by your caregiver.  RISKS AND COMPLICATIONS  Take your time so you do not get dizzy or light-headed.  If you are in pain, you may need to take or ask for pain medication before doing incentive spirometry. It is harder to take a deep breath if you are having pain. AFTER USE  Rest and breathe slowly and easily.  It can be helpful to keep track of a log of your progress. Your caregiver can provide you with a simple table to help with this. If you are using the spirometer at home, follow these instructions: [place] IF:   You are having difficultly using the spirometer.  You have trouble using the spirometer as often as instructed.  Your pain medication is not giving enough relief while using the spirometer.  You develop fever of 100.5 F (38.1 C) or higher. SEEK IMMEDIATE MEDICAL CARE IF:   You cough up bloody sputum  that had not been present before.  You develop fever of 102 F (38.9 C) or greater.  You develop worsening pain at or near the incision site. MAKE SURE YOU:   Understand these instructions.  Will watch your condition.  Will get help right away if you are not doing well or get worse. Document Released: 06/17/2006 Document Revised: 04/29/2011 Document Reviewed: 08/18/2006 [facility] Patient Information 2014 [place], [place].   ________________________________________________________________________

## 2018-10-20 NOTE — H&P (Signed)
Patient's anticipated LOS is less than 2 midnights, meeting these requirements: - Younger than 79 - Lives within 1 hour of care - Has a competent adult at home to recover with post-op recover - NO history of  - Chronic pain requiring opiods  - Diabetes  - Coronary Artery Disease  - Heart failure  - Heart attack  - Stroke  - DVT/VTE  - Cardiac arrhythmia  - Respiratory Failure/COPD  - Renal failure  - Anemia  - Advanced Liver disease       Cheryl Cooper is an 78 y.o. female.    Chief Complaint: left shoulder pain  HPI: Pt is a 78 y.o. female complaining of left shoulder pain for multiple years. Pain had continually increased since the beginning. X-rays in the clinic show end-stage arthritic changes of the left shoulder. Pt has tried various conservative treatments which have failed to alleviate their symptoms, including injections and therapy. Various options are discussed with the patient. Risks, benefits and expectations were discussed with the patient. Patient understand the risks, benefits and expectations and wishes to proceed with surgery.   PCP:  Wendie Agreste, MD  D/C Plans: Home  PMH: Past Medical History:  Diagnosis Date  . Anxiety   . Carpal tunnel syndrome   . Deafness in left ear   . Depression   . Diabetes mellitus    diet controlled  . GERD (gastroesophageal reflux disease)   . Hypercholesteremia   . Hypertension   . Hypothyroid   . Memory loss    reports d/t concussion  . PONV (postoperative nausea and vomiting) yrs ago, none recent  . Post concussion syndrome   . Stroke [facility])    reports they were silent    PSH: Past Surgical History:  Procedure Laterality Date  . ABDOMINAL HYSTERECTOMY  1986  . BACK SURGERY  2010   lower  . BIOPSY  01/12/2018   Procedure: BIOPSY;  Surgeon: Rush Landmark Telford Nab., MD;  Location: [facility];  Service: Gastroenterology;;  . CHOLECYSTECTOMY    . ESOPHAGOGASTRODUODENOSCOPY (EGD) WITH PROPOFOL N/A  01/12/2018   Procedure: ESOPHAGOGASTRODUODENOSCOPY (EGD) WITH PROPOFOL;  Surgeon: Rush Landmark Telford Nab., MD;  Location: [facility];  Service: Gastroenterology;  Laterality: N/A;  . left elobow surgery  2003  . left rotator cuff  2001  . LUMBAR LAMINECTOMY/DECOMPRESSION MICRODISCECTOMY Left 12/23/2017   Procedure: Laminectomy for facet/synovial cyst - left - Lumbar three-Lumbar four;  Surgeon: Earnie Larsson, MD;  Location: [facility];  Service: Neurosurgery;  Laterality: Left;  . r foot surgery  1995  . right hand surgery  2001  . right index finger surgery  2009  . SHOULDER OPEN ROTATOR CUFF REPAIR  11/21/2011   Procedure: ROTATOR CUFF REPAIR SHOULDER OPEN;  Surgeon: Johnn Hai, MD;  Location: [facility];  Service: Orthopedics;  Laterality: Right;  with subacromial decompression  . SHOULDER OPEN ROTATOR CUFF REPAIR Right 05/09/2016   Procedure: Right shoulder mini open revision rotator cuff repair, subacromial decompression;  Surgeon: Susa Day, MD;  Location: [facility];  Service: Orthopedics;  Laterality: Right;  . TOTAL HIP ARTHROPLASTY Right 04/24/2017   Procedure: RIGHT TOTAL HIP ARTHROPLASTY ANTERIOR APPROACH;  Surgeon: Rod Can, MD;  Location: [facility];  Service: Orthopedics;  Laterality: Right;  Needs RNFA    Social History:  reports that she has never smoked. She has never used smokeless tobacco. She reports current alcohol use of about 4.0 standard drinks of alcohol per week. She reports that she does not use drugs.  Allergies:  Allergies  Allergen Reactions  . Other Other (See Comments)    Tomato sauce, garlic, onion - severe acid reflux   . Flexeril [Cyclobenzaprine] Other (See Comments)    Per spouse "she felt like a zombie"  . Atorvastatin Other (See Comments)    Unbalanced  . Chlordiazepoxide-Clidinium Other (See Comments)    Dizziness (intolerance)  . Codeine Nausea Only    Medications: No current facility-administered medications for this encounter.    Current  Outpatient Medications  Medication Sig Dispense Refill  . acetaminophen (TYLENOL) 500 MG tablet Take 500-1,000 mg by mouth every 6 (six) hours as needed (PAIN).    [name] alendronate (FOSAMAX) 70 MG tablet Take 70 mg by mouth every Sunday. Take with a full glass of water on an empty stomach.     [name] amLODipine (NORVASC) 5 MG tablet Take 1 tablet (5 mg total) by mouth daily. 90 tablet 1  . Calcium Carbonate-Vitamin D (CALCIUM-D PO) Take 2 tablets by mouth daily. CHEWABLES    . escitalopram (LEXAPRO) 20 MG tablet Take 20 mg by mouth daily.    [name] esomeprazole (NEXIUM) 40 MG capsule Take 1 capsule (40 mg total) by mouth 2 (two) times daily before a meal. 60 capsule 0  . ezetimibe (ZETIA) 10 MG tablet Take 1 tablet (10 mg total) by mouth daily. 90 tablet 1  . levothyroxine (SYNTHROID) 75 MCG tablet Take 1 tablet (75 mcg total) by mouth daily with breakfast. 90 tablet 1  . Multiple Vitamins-Minerals (WOMENS MULTI GUMMIES PO) Take 2 tablets by mouth daily.    [name] oxybutynin (DITROPAN XL) 15 MG 24 hr tablet Take 1 tablet (15 mg total) by mouth at bedtime. 90 tablet 1  . simvastatin (ZOCOR) 20 MG tablet Take 1 tablet (20 mg total) by mouth at bedtime. 90 tablet 1  . traZODone (DESYREL) 50 MG tablet Take 25 mg by mouth at bedtime.       No results found for this or any previous visit (from the past 48 hour(s)). No results found.  ROS: Pain with rom of the left upper extremity  Physical Exam: Alert and oriented 78 y.o. female in no acute distress Cranial nerves 2-12 intact Cervical spine: full rom with no tenderness, nv intact distally Chest: active breath sounds bilaterally, no wheeze rhonchi or rales Heart: regular rate and rhythm, no murmur Abd: non tender non distended with active bowel sounds Hip is stable with rom  Left shoulder pain and weakness with rom nv intact distally No rashes or edema  Assessment/Plan Assessment: left shoulder cuff arthropathy  Plan:  Patient will undergo a left  reverse total shoulder by Dr. Veverly Fells at [facility]. Risks benefits and expectations were discussed with the patient. Patient understand risks, benefits and expectations and wishes to proceed. Preoperative templating of the joint replacement has been completed, documented, and submitted to the Operating Room personnel in order to optimize intra-operative equipment management.   Merla Riches PA-C, MPAS [facility] is now [employer] [address], [place], [place], [place] [address] Phone: [phone] [email or web address] Facebook  [employer]

## 2018-10-21 ENCOUNTER — Other Ambulatory Visit: Payer: Self-pay

## 2018-10-21 ENCOUNTER — Encounter (HOSPITAL_COMMUNITY)
Admission: RE | Admit: 2018-10-21 | Discharge: 2018-10-21 | Disposition: A | Discharge status: Home / Self Care | Payer: Medicare Other | Source: Ambulatory Visit | Attending: Orthopedic Surgery | Admitting: Orthopedic Surgery

## 2018-10-21 ENCOUNTER — Encounter (HOSPITAL_COMMUNITY): Payer: Self-pay

## 2018-10-21 LAB — HEMOGLOBIN A1C
Hgb A1c MFr Bld: 6.6 % — ABNORMAL HIGH (ref 4.8–5.6)
Mean Plasma Glucose: 142.72 mg/dL

## 2018-10-21 LAB — BASIC METABOLIC PANEL
Anion gap: 11 (ref 5–15)
BUN: 12 mg/dL (ref 8–23)
CO2: 27 mmol/L (ref 22–32)
Calcium: 9.3 mg/dL (ref 8.9–10.3)
Chloride: 103 mmol/L (ref 98–111)
Creatinine, Ser: 0.97 mg/dL (ref 0.44–1.00)
GFR calc Af Amer: >60 mL/min (ref >60)
GFR calc non Af Amer: 57 mL/min — ABNORMAL LOW (ref >60)
Glucose, Bld: 111 mg/dL — ABNORMAL HIGH (ref 70–99)
Potassium: 4.3 mmol/L (ref 3.5–5.1)
Sodium: 141 mmol/L (ref 135–145)

## 2018-10-21 LAB — GLUCOSE, CAPILLARY: Glucose-Capillary: 112 mg/dL — ABNORMAL HIGH (ref 70–99)

## 2018-10-21 LAB — CBC
HCT: 42.2 % (ref 36.0–46.0)
Hemoglobin: 13.1 g/dL (ref 12.0–15.0)
MCH: 27.3 pg (ref 26.0–34.0)
MCHC: 31 g/dL (ref 30.0–36.0)
MCV: 88.1 fL (ref 80.0–100.0)
Platelets: 200 10*3/uL (ref 150–400)
RBC: 4.79 MIL/uL (ref 3.87–5.11)
RDW: 14.3 % (ref 11.5–15.5)
WBC: 6 10*3/uL (ref 4.0–10.5)
nRBC: 0 % (ref 0.0–0.2)

## 2018-10-21 LAB — SURGICAL PCR SCREEN
MRSA, PCR: NEGATIVE
Staphylococcus aureus: NEGATIVE

## 2018-10-21 NOTE — Progress Notes (Signed)
PCP - Merri Ray, MD w/clearance ON 09-28-18 Cardiologist -   Chest x-ray - 09-28-18   EKG - 09-28-18 Stress Test -  ECHO -  Cardiac Cath -   Sleep Study -  CPAP -   Fasting Blood Sugar -  Checks Blood Sugar _____ times a day  Blood Thinner Instructions: Aspirin Instructions: Last Dose:  Anesthesia review:   Patient denies shortness of breath, fever, cough and chest pain at PAT appointment   Patient verbalized understanding of instructions that were given to them at the PAT appointment. Patient was also instructed that they will need to review over the PAT instructions again at home before surgery.

## 2018-10-22 MED ORDER — DEXTROSE 5 % IV SOLN
3.0000 g | INTRAVENOUS | Status: AC
  Administered 2018-10-23: 08:00:00 2 g via INTRAVENOUS
  Filled 2018-10-22: qty 3

## 2018-10-23 ENCOUNTER — Inpatient Hospital Stay (HOSPITAL_COMMUNITY): Payer: Medicare Other

## 2018-10-23 ENCOUNTER — Encounter (HOSPITAL_COMMUNITY): Payer: Self-pay | Admitting: *Deleted

## 2018-10-23 ENCOUNTER — Inpatient Hospital Stay (HOSPITAL_COMMUNITY)
Admission: RE | Admit: 2018-10-23 | Discharge: 2018-10-24 | DRG: 483 | Disposition: A | Discharge status: Home / Self Care | Payer: Medicare Other | Source: Ambulatory Visit | Attending: Orthopedic Surgery | Admitting: Orthopedic Surgery

## 2018-10-23 ENCOUNTER — Inpatient Hospital Stay (HOSPITAL_COMMUNITY): Payer: Medicare Other | Admitting: Certified Registered Nurse Anesthetist

## 2018-10-23 ENCOUNTER — Encounter (HOSPITAL_COMMUNITY)
Admission: RE | Disposition: A | Discharge status: Home / Self Care | Payer: Self-pay | Source: Home / Self Care | Attending: Orthopedic Surgery

## 2018-10-23 ENCOUNTER — Other Ambulatory Visit: Payer: Self-pay

## 2018-10-23 ENCOUNTER — Inpatient Hospital Stay (HOSPITAL_COMMUNITY): Payer: Medicare Other | Admitting: Physician Assistant

## 2018-10-23 DIAGNOSIS — Z885 Allergy status to narcotic agent status: Secondary | ICD-10-CM | POA: Diagnosis not present

## 2018-10-23 DIAGNOSIS — Z79890 Hormone replacement therapy (postmenopausal): Secondary | ICD-10-CM | POA: Diagnosis not present

## 2018-10-23 DIAGNOSIS — Z8673 Personal history of transient ischemic attack (TIA), and cerebral infarction without residual deficits: Secondary | ICD-10-CM

## 2018-10-23 DIAGNOSIS — Z888 Allergy status to other drugs, medicaments and biological substances status: Secondary | ICD-10-CM

## 2018-10-23 DIAGNOSIS — F0781 Postconcussional syndrome: Secondary | ICD-10-CM | POA: Diagnosis present

## 2018-10-23 DIAGNOSIS — Z79899 Other long term (current) drug therapy: Secondary | ICD-10-CM

## 2018-10-23 DIAGNOSIS — K219 Gastro-esophageal reflux disease without esophagitis: Secondary | ICD-10-CM | POA: Diagnosis present

## 2018-10-23 DIAGNOSIS — E119 Type 2 diabetes mellitus without complications: Secondary | ICD-10-CM | POA: Diagnosis present

## 2018-10-23 DIAGNOSIS — F329 Major depressive disorder, single episode, unspecified: Secondary | ICD-10-CM | POA: Diagnosis present

## 2018-10-23 DIAGNOSIS — Z20828 Contact with and (suspected) exposure to other viral communicable diseases: Secondary | ICD-10-CM | POA: Diagnosis present

## 2018-10-23 DIAGNOSIS — E7800 Pure hypercholesterolemia, unspecified: Secondary | ICD-10-CM | POA: Diagnosis present

## 2018-10-23 DIAGNOSIS — Z96612 Presence of left artificial shoulder joint: Secondary | ICD-10-CM

## 2018-10-23 DIAGNOSIS — H9192 Unspecified hearing loss, left ear: Secondary | ICD-10-CM | POA: Diagnosis present

## 2018-10-23 DIAGNOSIS — Z01812 Encounter for preprocedural laboratory examination: Secondary | ICD-10-CM | POA: Diagnosis not present

## 2018-10-23 DIAGNOSIS — M19012 Primary osteoarthritis, left shoulder: Principal | ICD-10-CM | POA: Diagnosis present

## 2018-10-23 DIAGNOSIS — I10 Essential (primary) hypertension: Secondary | ICD-10-CM | POA: Diagnosis present

## 2018-10-23 DIAGNOSIS — M25512 Pain in left shoulder: Secondary | ICD-10-CM | POA: Diagnosis present

## 2018-10-23 DIAGNOSIS — Z7983 Long term (current) use of bisphosphonates: Secondary | ICD-10-CM

## 2018-10-23 DIAGNOSIS — E039 Hypothyroidism, unspecified: Secondary | ICD-10-CM | POA: Diagnosis present

## 2018-10-23 DIAGNOSIS — F419 Anxiety disorder, unspecified: Secondary | ICD-10-CM | POA: Diagnosis present

## 2018-10-23 DIAGNOSIS — Z91018 Allergy to other foods: Secondary | ICD-10-CM

## 2018-10-23 HISTORY — PX: REVERSE SHOULDER ARTHROPLASTY: SHX5054

## 2018-10-23 LAB — GLUCOSE, CAPILLARY
Glucose-Capillary: 115 mg/dL — ABNORMAL HIGH (ref 70–99)
Glucose-Capillary: 141 mg/dL — ABNORMAL HIGH (ref 70–99)

## 2018-10-23 SURGERY — ARTHROPLASTY, SHOULDER, TOTAL, REVERSE
Anesthesia: General | Site: Shoulder | Laterality: Left

## 2018-10-23 MED ORDER — SIMVASTATIN 20 MG PO TABS
20.0000 mg | ORAL_TABLET | Freq: Every day | ORAL | Status: DC
  Administered 2018-10-23: 20 mg via ORAL
  Filled 2018-10-23: qty 1

## 2018-10-23 MED ORDER — FENTANYL CITRATE (PF) 100 MCG/2ML IJ SOLN: 25.0000 ug | INTRAMUSCULAR | Status: DC | PRN

## 2018-10-23 MED ORDER — FENTANYL CITRATE (PF) 100 MCG/2ML IJ SOLN
INTRAMUSCULAR | Status: AC
  Filled 2018-10-23: qty 2

## 2018-10-23 MED ORDER — CEFAZOLIN SODIUM-DEXTROSE 2-4 GM/100ML-% IV SOLN
INTRAVENOUS | Status: AC
  Filled 2018-10-23: qty 100

## 2018-10-23 MED ORDER — METOCLOPRAMIDE HCL 5 MG PO TABS: 5.0000 mg | ORAL_TABLET | Freq: Three times a day (TID) | ORAL | Status: DC | PRN

## 2018-10-23 MED ORDER — MEPERIDINE HCL 50 MG/ML IJ SOLN: 6.2500 mg | INTRAMUSCULAR | Status: DC | PRN

## 2018-10-23 MED ORDER — PROPOFOL 10 MG/ML IV BOLUS
INTRAVENOUS | Status: DC | PRN
  Administered 2018-10-23: 150 mg via INTRAVENOUS
  Administered 2018-10-23: 20 mg via INTRAVENOUS

## 2018-10-23 MED ORDER — LACTATED RINGERS IV SOLN
INTRAVENOUS | Status: DC
  Administered 2018-10-23: 1000 mL via INTRAVENOUS
  Administered 2018-10-23: 07:00:00 via INTRAVENOUS

## 2018-10-23 MED ORDER — OXYBUTYNIN CHLORIDE ER 5 MG PO TB24
15.0000 mg | ORAL_TABLET | Freq: Every day | ORAL | Status: DC
  Administered 2018-10-23: 15 mg via ORAL
  Filled 2018-10-23: qty 3

## 2018-10-23 MED ORDER — ACETAMINOPHEN 325 MG PO TABS: 325.0000 mg | ORAL_TABLET | Freq: Four times a day (QID) | ORAL | Status: DC | PRN

## 2018-10-23 MED ORDER — DEXAMETHASONE SODIUM PHOSPHATE 10 MG/ML IJ SOLN
INTRAMUSCULAR | Status: AC
  Filled 2018-10-23: qty 1

## 2018-10-23 MED ORDER — CHLORHEXIDINE GLUCONATE 4 % EX LIQD: 60.0000 mL | Freq: Once | CUTANEOUS | Status: DC

## 2018-10-23 MED ORDER — ONDANSETRON HCL 4 MG/2ML IJ SOLN: 4.0000 mg | Freq: Four times a day (QID) | INTRAMUSCULAR | Status: DC | PRN

## 2018-10-23 MED ORDER — BUPIVACAINE LIPOSOME 1.3 % IJ SUSP
INTRAMUSCULAR | Status: DC | PRN
  Administered 2018-10-23: 10 mL via PERINEURAL

## 2018-10-23 MED ORDER — ESCITALOPRAM OXALATE 20 MG PO TABS
20.0000 mg | ORAL_TABLET | Freq: Every day | ORAL | Status: DC
  Administered 2018-10-24: 10:00:00 20 mg via ORAL
  Filled 2018-10-23: qty 1

## 2018-10-23 MED ORDER — SUCCINYLCHOLINE CHLORIDE 200 MG/10ML IV SOSY
PREFILLED_SYRINGE | INTRAVENOUS | Status: AC
  Filled 2018-10-23: qty 10

## 2018-10-23 MED ORDER — EZETIMIBE 10 MG PO TABS
10.0000 mg | ORAL_TABLET | Freq: Every day | ORAL | Status: DC
  Administered 2018-10-23 – 2018-10-24 (×2): 10 mg via ORAL
  Filled 2018-10-23 (×2): qty 1

## 2018-10-23 MED ORDER — TRAMADOL HCL 50 MG PO TABS
50.0000 mg | ORAL_TABLET | Freq: Four times a day (QID) | ORAL | Status: DC | PRN
  Administered 2018-10-23 – 2018-10-24 (×2): 50 mg via ORAL
  Filled 2018-10-23 (×2): qty 1

## 2018-10-23 MED ORDER — METHOCARBAMOL 500 MG PO TABS: 500.0000 mg | ORAL_TABLET | Freq: Three times a day (TID) | ORAL | 1 refills | Status: DC | PRN

## 2018-10-23 MED ORDER — SODIUM CHLORIDE 0.9 % IR SOLN
Status: DC | PRN
  Administered 2018-10-23: 1000 mL

## 2018-10-23 MED ORDER — METHOCARBAMOL 500 MG PO TABS
500.0000 mg | ORAL_TABLET | Freq: Four times a day (QID) | ORAL | Status: DC | PRN
  Administered 2018-10-23 – 2018-10-24 (×2): 500 mg via ORAL
  Filled 2018-10-23 (×2): qty 1

## 2018-10-23 MED ORDER — SODIUM CHLORIDE 0.9 % IV SOLN
INTRAVENOUS | Status: DC | PRN
  Administered 2018-10-23: 25 ug/min via INTRAVENOUS

## 2018-10-23 MED ORDER — BUPIVACAINE-EPINEPHRINE 0.5% -1:200000 IJ SOLN
INTRAMUSCULAR | Status: DC | PRN
  Administered 2018-10-23: 10 mL

## 2018-10-23 MED ORDER — METOCLOPRAMIDE HCL 5 MG/ML IJ SOLN: 5.0000 mg | Freq: Three times a day (TID) | INTRAMUSCULAR | Status: DC | PRN

## 2018-10-23 MED ORDER — BUPIVACAINE HCL (PF) 0.5 % IJ SOLN
INTRAMUSCULAR | Status: DC | PRN
  Administered 2018-10-23: 15 mL via PERINEURAL

## 2018-10-23 MED ORDER — PROPOFOL 10 MG/ML IV BOLUS
INTRAVENOUS | Status: AC
  Filled 2018-10-23: qty 20

## 2018-10-23 MED ORDER — HYDROCODONE-ACETAMINOPHEN 5-325 MG PO TABS
1.0000 | ORAL_TABLET | ORAL | Status: DC | PRN
  Administered 2018-10-24: 1 via ORAL
  Filled 2018-10-23: qty 1

## 2018-10-23 MED ORDER — ONDANSETRON HCL 4 MG PO TABS: 4.0000 mg | ORAL_TABLET | Freq: Four times a day (QID) | ORAL | Status: DC | PRN

## 2018-10-23 MED ORDER — ACETAMINOPHEN 500 MG PO TABS
500.0000 mg | ORAL_TABLET | Freq: Four times a day (QID) | ORAL | Status: DC | PRN
  Administered 2018-10-23: 1000 mg via ORAL
  Filled 2018-10-23: qty 2

## 2018-10-23 MED ORDER — SUCCINYLCHOLINE CHLORIDE 200 MG/10ML IV SOSY
PREFILLED_SYRINGE | INTRAVENOUS | Status: DC | PRN
  Administered 2018-10-23: 100 mg via INTRAVENOUS

## 2018-10-23 MED ORDER — SODIUM CHLORIDE 0.9 % IV SOLN
INTRAVENOUS | Status: DC
  Administered 2018-10-23: 16:00:00 via INTRAVENOUS

## 2018-10-23 MED ORDER — CEFAZOLIN SODIUM-DEXTROSE 2-4 GM/100ML-% IV SOLN
2.0000 g | Freq: Four times a day (QID) | INTRAVENOUS | Status: AC
  Administered 2018-10-23 – 2018-10-24 (×3): 2 g via INTRAVENOUS
  Filled 2018-10-23 (×3): qty 100

## 2018-10-23 MED ORDER — BUPIVACAINE-EPINEPHRINE (PF) 0.5% -1:200000 IJ SOLN
INTRAMUSCULAR | Status: AC
  Filled 2018-10-23: qty 30

## 2018-10-23 MED ORDER — MORPHINE SULFATE (PF) 2 MG/ML IV SOLN: 0.5000 mg | INTRAVENOUS | Status: DC | PRN

## 2018-10-23 MED ORDER — DOCUSATE SODIUM 100 MG PO CAPS
100.0000 mg | ORAL_CAPSULE | Freq: Two times a day (BID) | ORAL | Status: DC
  Administered 2018-10-23 – 2018-10-24 (×2): 100 mg via ORAL
  Filled 2018-10-23 (×2): qty 1

## 2018-10-23 MED ORDER — TRAMADOL HCL 50 MG PO TABS: 50.0000 mg | ORAL_TABLET | Freq: Four times a day (QID) | ORAL | 0 refills | Status: DC | PRN

## 2018-10-23 MED ORDER — FENTANYL CITRATE (PF) 100 MCG/2ML IJ SOLN
INTRAMUSCULAR | Status: DC | PRN
  Administered 2018-10-23 (×3): 50 ug via INTRAVENOUS

## 2018-10-23 MED ORDER — ONDANSETRON HCL 4 MG/2ML IJ SOLN
INTRAMUSCULAR | Status: AC
  Filled 2018-10-23: qty 2

## 2018-10-23 MED ORDER — BISACODYL 10 MG RE SUPP: 10.0000 mg | Freq: Every day | RECTAL | Status: DC | PRN

## 2018-10-23 MED ORDER — PHENOL 1.4 % MT LIQD
1.0000 | OROMUCOSAL | Status: DC | PRN
  Filled 2018-10-23: qty 177

## 2018-10-23 MED ORDER — MENTHOL 3 MG MT LOZG: 1.0000 | LOZENGE | OROMUCOSAL | Status: DC | PRN

## 2018-10-23 MED ORDER — METOCLOPRAMIDE HCL 5 MG/ML IJ SOLN: 10.0000 mg | Freq: Once | INTRAMUSCULAR | Status: DC | PRN

## 2018-10-23 MED ORDER — METHOCARBAMOL 500 MG IVPB - SIMPLE MED
500.0000 mg | Freq: Four times a day (QID) | INTRAVENOUS | Status: DC | PRN
  Filled 2018-10-23: qty 50

## 2018-10-23 MED ORDER — ONDANSETRON HCL 4 MG/2ML IJ SOLN
INTRAMUSCULAR | Status: DC | PRN
  Administered 2018-10-23: 4 mg via INTRAVENOUS

## 2018-10-23 MED ORDER — POLYETHYLENE GLYCOL 3350 17 G PO PACK: 17.0000 g | PACK | Freq: Every day | ORAL | Status: DC | PRN

## 2018-10-23 MED ORDER — HYDROCODONE-ACETAMINOPHEN 5-325 MG PO TABS: 1.0000 | ORAL_TABLET | Freq: Four times a day (QID) | ORAL | 0 refills | Status: DC | PRN

## 2018-10-23 MED ORDER — LIDOCAINE 2% (20 MG/ML) 5 ML SYRINGE
INTRAMUSCULAR | Status: AC
  Filled 2018-10-23: qty 5

## 2018-10-23 MED ORDER — ROCURONIUM BROMIDE 10 MG/ML (PF) SYRINGE
PREFILLED_SYRINGE | INTRAVENOUS | Status: AC
  Filled 2018-10-23: qty 10

## 2018-10-23 MED ORDER — EPHEDRINE 5 MG/ML INJ
INTRAVENOUS | Status: AC
  Filled 2018-10-23: qty 20

## 2018-10-23 MED ORDER — LIDOCAINE 2% (20 MG/ML) 5 ML SYRINGE
INTRAMUSCULAR | Status: DC | PRN
  Administered 2018-10-23: 100 mg via INTRAVENOUS

## 2018-10-23 MED ORDER — TRAZODONE HCL 50 MG PO TABS
25.0000 mg | ORAL_TABLET | Freq: Every day | ORAL | Status: DC
  Administered 2018-10-23: 22:00:00 25 mg via ORAL
  Filled 2018-10-23: qty 1

## 2018-10-23 MED ORDER — LEVOTHYROXINE SODIUM 75 MCG PO TABS
75.0000 ug | ORAL_TABLET | Freq: Every day | ORAL | Status: DC
  Administered 2018-10-24: 75 ug via ORAL
  Filled 2018-10-23: qty 1

## 2018-10-23 MED ORDER — ALENDRONATE SODIUM 70 MG PO TABS: 70.0000 mg | ORAL_TABLET | ORAL | Status: DC

## 2018-10-23 MED ORDER — DEXAMETHASONE SODIUM PHOSPHATE 10 MG/ML IJ SOLN
INTRAMUSCULAR | Status: DC | PRN
  Administered 2018-10-23: 10 mg via INTRAVENOUS

## 2018-10-23 MED ORDER — PANTOPRAZOLE SODIUM 40 MG PO TBEC
40.0000 mg | DELAYED_RELEASE_TABLET | Freq: Every day | ORAL | Status: DC
  Administered 2018-10-23 – 2018-10-24 (×2): 40 mg via ORAL
  Filled 2018-10-23 (×2): qty 1

## 2018-10-23 MED ORDER — EPHEDRINE SULFATE-NACL 50-0.9 MG/10ML-% IV SOSY
PREFILLED_SYRINGE | INTRAVENOUS | Status: DC | PRN
  Administered 2018-10-23 (×2): 15 mg via INTRAVENOUS
  Administered 2018-10-23 (×2): 10 mg via INTRAVENOUS

## 2018-10-23 MED ORDER — AMLODIPINE BESYLATE 5 MG PO TABS
5.0000 mg | ORAL_TABLET | Freq: Every day | ORAL | Status: DC
  Administered 2018-10-24: 10:00:00 5 mg via ORAL
  Filled 2018-10-23: qty 1

## 2018-10-23 SURGICAL SUPPLY — 71 items
BAG ZIPLOCK 12X15 (MISCELLANEOUS) ×1 IMPLANT
BIT DRILL 1.6MX128 (BIT) IMPLANT
BIT DRILL 1.6MX128MM (BIT)
BIT DRILL 170X2.5X (BIT) IMPLANT
BIT DRL 170X2.5X (BIT) ×1
BLADE SAG 18X100X1.27 (BLADE) ×3 IMPLANT
CLOSURE WOUND 1/2 X4 (GAUZE/BANDAGES/DRESSINGS) ×1
COVER BACK TABLE 60X90IN (DRAPES) ×3 IMPLANT
COVER SURGICAL LIGHT HANDLE (MISCELLANEOUS) ×3 IMPLANT
COVER WAND RF STERILE (DRAPES) IMPLANT
DECANTER SPIKE VIAL GLASS SM (MISCELLANEOUS) ×3 IMPLANT
DRAPE INCISE IOBAN 66X45 STRL (DRAPES) ×3 IMPLANT
DRAPE ORTHO SPLIT 77X108 STRL (DRAPES) ×4
DRAPE SHEET LG 3/4 BI-LAMINATE (DRAPES) ×3 IMPLANT
DRAPE SURG ORHT 6 SPLT 77X108 (DRAPES) ×2 IMPLANT
DRAPE U-SHAPE 47X51 STRL (DRAPES) ×3 IMPLANT
DRILL 2.5 (BIT) ×2
DRSG ADAPTIC 3X8 NADH LF (GAUZE/BANDAGES/DRESSINGS) ×3 IMPLANT
DRSG PAD ABDOMINAL 8X10 ST (GAUZE/BANDAGES/DRESSINGS) ×3 IMPLANT
DURAPREP 26ML APPLICATOR (WOUND CARE) ×3 IMPLANT
ELECT BLADE TIP CTD 4 INCH (ELECTRODE) ×3 IMPLANT
ELECT NDL TIP 2.8 STRL (NEEDLE) ×1 IMPLANT
ELECT NEEDLE TIP 2.8 STRL (NEEDLE) ×3 IMPLANT
ELECT REM PT RETURN 15FT ADLT (MISCELLANEOUS) ×3 IMPLANT
EPI LT SZ 1 (Orthopedic Implant) ×3 IMPLANT
EPIPHYSIS LT SZ 1 (Orthopedic Implant) IMPLANT
GAUZE SPONGE 4X4 12PLY STRL (GAUZE/BANDAGES/DRESSINGS) ×3 IMPLANT
GLENOSPHERE LATRLZD 38 SHLDR (Miscellaneous) ×2 IMPLANT
GLOVE BIOGEL PI ORTHO PRO 7.5 (GLOVE) ×2
GLOVE BIOGEL PI ORTHO PRO SZ8 (GLOVE) ×2
GLOVE ORTHO TXT STRL SZ7.5 (GLOVE) ×3 IMPLANT
GLOVE PI ORTHO PRO STRL 7.5 (GLOVE) ×1 IMPLANT
GLOVE PI ORTHO PRO STRL SZ8 (GLOVE) ×1 IMPLANT
GLOVE SURG ORTHO 8.5 STRL (GLOVE) ×3 IMPLANT
GOWN STRL REUS W/TWL XL LVL3 (GOWN DISPOSABLE) ×6 IMPLANT
KIT BASIN OR (CUSTOM PROCEDURE TRAY) ×3 IMPLANT
KIT TURNOVER KIT A (KITS) IMPLANT
MANIFOLD NEPTUNE II (INSTRUMENTS) ×3 IMPLANT
METAGLENE DELTA EXTEND (Trauma) IMPLANT
METAGLENE DXTEND (Trauma) ×3 IMPLANT
NDL MAYO CATGUT SZ4 TPR NDL (NEEDLE) IMPLANT
NEEDLE MAYO CATGUT SZ4 (NEEDLE) ×3 IMPLANT
PACK SHOULDER (CUSTOM PROCEDURE TRAY) ×3 IMPLANT
PIN GUIDE 1.2 (PIN) ×2 IMPLANT
PIN GUIDE GLENOPHERE 1.5MX300M (PIN) ×2 IMPLANT
PIN METAGLENE 2.5 (PIN) ×2 IMPLANT
PROTECTOR NERVE ULNAR (MISCELLANEOUS) ×3 IMPLANT
RESTRAINT HEAD UNIVERSAL NS (MISCELLANEOUS) ×3 IMPLANT
SCREW 4.5X18MM (Screw) ×2 IMPLANT
SCREW BN 18X4.5XSTRL SHLDR (Screw) IMPLANT
SCREW LOCK 42 (Screw) ×2 IMPLANT
SCREW LOCK DELTA XTEND 4.5X30 (Screw) ×2 IMPLANT
SLING ARM FOAM STRAP LRG (SOFTGOODS) ×2 IMPLANT
SMARTMIX MINI TOWER (MISCELLANEOUS)
SPACER 38 PLUS 3 (Spacer) ×2 IMPLANT
SPONGE LAP 4X18 RFD (DISPOSABLE) IMPLANT
STEM DELTA DIA 10 HA (Stem) ×2 IMPLANT
STRIP CLOSURE SKIN 1/2X4 (GAUZE/BANDAGES/DRESSINGS) ×2 IMPLANT
SUCTION FRAZIER HANDLE 10FR (MISCELLANEOUS) ×2
SUCTION TUBE FRAZIER 10FR DISP (MISCELLANEOUS) ×1 IMPLANT
SUT FIBERWIRE #2 38 T-5 BLUE (SUTURE) ×9
SUT MNCRL AB 4-0 PS2 18 (SUTURE) ×3 IMPLANT
SUT VIC AB 0 CT1 36 (SUTURE) ×6 IMPLANT
SUT VIC AB 0 CT2 27 (SUTURE) ×3 IMPLANT
SUT VIC AB 2-0 CT1 27 (SUTURE) ×2
SUT VIC AB 2-0 CT1 TAPERPNT 27 (SUTURE) ×1 IMPLANT
SUTURE FIBERWR #2 38 T-5 BLUE (SUTURE) ×1 IMPLANT
TAPE CLOTH SURG 4X10 WHT LF (GAUZE/BANDAGES/DRESSINGS) ×2 IMPLANT
TOWEL OR 17X26 10 PK STRL BLUE (TOWEL DISPOSABLE) ×3 IMPLANT
TOWER SMARTMIX MINI (MISCELLANEOUS) IMPLANT
YANKAUER SUCT BULB TIP 10FT TU (MISCELLANEOUS) ×3 IMPLANT

## 2018-10-23 NOTE — Discharge Instructions (Signed)
Ice to the shoulder constantly.  Keep the incision covered and clean and dry for one week, then ok to get it wet in the shower. ° °Do exercise as instructed several times per day. ° °DO NOT reach behind your back or push up out of a chair with the operative arm. ° °Use a sling while you are up and around for comfort, may remove while seated.  Keep pillow propped behind the operative elbow. ° °Follow up with Dr Edra Riccardi in two weeks in the office, call [phone] for appt °

## 2018-10-23 NOTE — Anesthesia Procedure Notes (Signed)
Procedure Name: Intubation Date/Time: 10/23/2018 7:42 AM Performed by: Maxwell Caul, CRNA Pre-anesthesia Checklist: Patient identified, Emergency Drugs available, Suction available and Patient being monitored Patient Re-evaluated:Patient Re-evaluated prior to induction Oxygen Delivery Method: Circle system utilized Preoxygenation: Pre-oxygenation with 100% oxygen Induction Type: IV induction Laryngoscope Size: Mac and 3 Grade View: Grade II Tube type: Oral Tube size: 7.0 mm Number of attempts: 1 Airway Equipment and Method: Stylet Placement Confirmation: ETT inserted through vocal cords under direct vision,  positive ETCO2 and breath sounds checked- equal and bilateral Secured at: 21 cm Tube secured with: Tape Dental Injury: Teeth and Oropharynx as per pre-operative assessment  Difficulty Due To: Difficulty was anticipated and Difficult Airway- due to limited oral opening

## 2018-10-23 NOTE — Transfer of Care (Signed)
Immediate Anesthesia Transfer of Care Note  Patient: Cheryl Cooper  Procedure(s) Performed: REVERSE TOTAL SHOULDER ARTHROPLASTY (Left Shoulder)  Patient Location: PACU  Anesthesia Type:General  Level of Consciousness: awake, alert  and oriented  Airway & Oxygen Therapy: Patient Spontanous Breathing and Patient connected to face mask oxygen  Post-op Assessment: Report given to RN and Post -op Vital signs reviewed and stable  Post vital signs: Reviewed and stable  Last Vitals:  Vitals Value Taken Time  BP 119/54 10/23/18 0933  Temp    Pulse 77 10/23/18 0937  Resp 24 10/23/18 0937  SpO2 95 % 10/23/18 0937  Vitals shown include unvalidated device data.  Last Pain:  Vitals:   10/23/18 0626  TempSrc:   PainSc: 7       Patients Stated Pain Goal: 4 ([id number])  Complications: No apparent anesthesia complications

## 2018-10-23 NOTE — Interval H&P Note (Signed)
History and Physical Interval Note:  10/23/2018 7:32 AM  Cheryl Cooper  has presented today for surgery, with the diagnosis of Left shoudler osteoarthritis and pain.  The various methods of treatment have been discussed with the patient and family. After consideration of risks, benefits and other options for treatment, the patient has consented to  Procedure(s) with comments: REVERSE TOTAL SHOULDER ARTHROPLASTY (Left) - interscalene block as a surgical intervention.  The patient's history has been reviewed, patient examined, no change in status, stable for surgery.  I have reviewed the patient's chart and labs.  Questions were answered to the patient's satisfaction.     Cheryl Cooper

## 2018-10-23 NOTE — Anesthesia Preprocedure Evaluation (Signed)
Anesthesia Evaluation  Patient identified by MRN, date of birth, ID band Patient awake    Reviewed: Allergy & Precautions, NPO status , Patient's Chart, lab work & pertinent test results  History of Anesthesia Complications (+) PONV  Airway Mallampati: II  TM Distance: >3 FB Neck ROM: Full    Dental no notable dental hx.    Pulmonary neg pulmonary ROS,    Pulmonary exam normal breath sounds clear to auscultation       Cardiovascular hypertension, Pt. on medications Normal cardiovascular exam Rhythm:Regular Rate:Normal     Neuro/Psych CVA, No Residual Symptoms negative psych ROS   GI/Hepatic negative GI ROS, Neg liver ROS,   Endo/Other  diabetes, Type 2  Renal/GU negative Renal ROS  negative genitourinary   Musculoskeletal negative musculoskeletal ROS (+)   Abdominal   Peds negative pediatric ROS (+)  Hematology negative hematology ROS (+)   Anesthesia Other Findings   Reproductive/Obstetrics negative OB ROS                             Anesthesia Physical  Anesthesia Plan  ASA: II  Anesthesia Plan: General   Post-op Pain Management:  Regional for Post-op pain   Induction: Intravenous  PONV Risk Score and Plan: 4 or greater and Ondansetron, Treatment may vary due to age or medical condition and Dexamethasone  Airway Management Planned: Oral ETT  Additional Equipment:   Intra-op Plan:   Post-operative Plan: Extubation in OR  Informed Consent: I have reviewed the patients History and Physical, chart, labs and discussed the procedure including the risks, benefits and alternatives for the proposed anesthesia with the patient or authorized representative who has indicated his/her understanding and acceptance.     Dental advisory given  Plan Discussed with: CRNA  Anesthesia Plan Comments:         Anesthesia Quick Evaluation

## 2018-10-23 NOTE — Op Note (Signed)
NAMEEMONNI, RANTA MEDICAL RECORD [id number] ACCOUNT [id number] DATE OF BIRTH:June 16, 1940 FACILITY: [facility] LOCATION: [facility] PHYSICIAN:STEVEN Orlena Sheldon, MD  OPERATIVE REPORT  DATE OF PROCEDURE:  10/23/2018  PREOPERATIVE DIAGNOSIS:  Left shoulder end-stage osteoarthritis.  POSTOPERATIVE DIAGNOSIS:  Left shoulder end-stage osteoarthritis.  PROCEDURE PERFORMED:  Left reverse total shoulder arthroplasty using DePuy Delta Xtend prosthesis.  ATTENDING SURGEON:  Esmond Plants, MD  ASSISTANT:  Darol Destine, [place], who was scrubbed during the entire procedure and necessary for satisfactory completion of surgery.  ANESTHESIA:  General anesthesia was used plus interscalene block.  ESTIMATED BLOOD LOSS:  Less than 100 mL.  FLUID REPLACEMENT:  1500 mL crystalloid.  INSTRUMENT COUNTS:  Correct.  COMPLICATIONS:  None.  ANTIBIOTICS:  Perioperative antibiotics were given.  INDICATIONS:  The patient is a 78 year old female with worsening left shoulder pain and dysfunction secondary to a combination of end-stage arthritis and rotator cuff insufficiency.  The patient has failed conservative management.  Desires operative  treatment to restore function and eliminate pain to her shoulder.  Informed consent obtained.  DESCRIPTION OF PROCEDURE:  After an adequate level of anesthesia was achieved, the patient was positioned in the modified beach chair position.  Left shoulder correctly identified and sterilely prepped and draped in the usual manner and timeout called  verifying correct patient, correct site.  We entered the patient's shoulder after a sterile prep and drape and a timeout. Utilizing a deltopectoral approach starting at the coracoid process, extending down to the anterior humerus, dissection down through  subcutaneous tissues using Bovie, we identified the cephalic vein, took it laterally with the deltoid pectoralis taken medially.  Conjoined tendon identified and retracted  medially.  We tenodesed the biceps in situ with 0 Vicryl figure-of-eight suture.   We then released the subscap remnant off the lesser tuberosity.  This was in poor shape and not repairable, but we did want to have control over it for protection of the axillary nerve.  Once we had our sutures placed, we released the inferior capsule,  progressively externally rotating the humerus.  We delivered the humerus out of the wound, entered the proximal humerus with a 6 mm reamer, reamed up to size 10, placed our 10 mm intramedullary resection guide to do our head resection, which we resected  at 10 degrees of retroversion.  We removed excess osteophytes with a rongeur.  We then subluxed the humerus posteriorly, gaining exposure of the glenoid face.  We were able to place our retractors and do a 360-degree capsular labral excision including  the biceps anchor and labrum.  Once we had that clear, then we were able to get a good view of the glenoid face and the inferior scapular neck.  We were able to identify the 12 and 6 o'clock positions.  We placed our centered guide pin.  We then reamed,  angled slightly inferiorly for the metaglene baseplate.  We then drilled our central peg hole, impacted the metaglene into place, placed a 42 locked screw inferiorly, a 30 locked at the base of the coracoid and an 18 nonlocked posteriorly.  We had good  baseplate security and fixation.  We then selected a 38 standard eccentric, so no lateral offset, but we did want the inferior offset.  We placed that inferiorly and then secured the glenosphere to the baseplate.  We did a finger sweep to make sure no  soft tissue was incorporated into that bearing.  We then directed our attention back towards the humeral side where we  did our metaphyseal preparation with a 1 left reamer.  We then selected the 10 stem, the 1 left metaphysis set on the 0 setting and  placed in 10 degrees of retroversion and impacted that in position.  We placed a  38+3 standard poly on the humeral side and reduced the shoulder.  It had nice little pop as it reduced, very stable and secure.  We removed the trial components from the  humeral side, irrigating thoroughly, and then used press-fit technique with available bone graft from the head with impaction grafting with the HA-coated size 10 stem and the 1 left metaphysis set on 0 setting, and we impacted that in 10 degrees of  retroversion.  It was nice and stable.  We placed our 38+3 real poly, impacted that in place, then reduced the shoulder.  Again, nice tension with the conjoined appropriately tensioned.  No gapping with inferior pole or external rotation and not  overtight.  We irrigated again and then closed the deltopectoral interval after resecting the subscap remnant.  We closed with #1 Vicryl suture, followed by 2-0 Vicryl for subcutaneous closure and 4-0 Monocryl for skin.  Steri-Strips applied, followed by  a sterile dressing.  The patient tolerated surgery well.  LN/NUANCE  D:10/23/2018 T:10/23/2018 JOB:[id number]

## 2018-10-23 NOTE — Anesthesia Procedure Notes (Signed)
Anesthesia Regional Block: Supraclavicular block   Pre-Anesthetic Checklist: ,, timeout performed, Correct Patient, Correct Site, Correct Laterality, Correct Procedure, Correct Position, site marked, Risks and benefits discussed,  Surgical consent,  Pre-op evaluation,  At surgeon's request and post-op pain management  Laterality: Left and Upper  Prep: Maximum Sterile Barrier Precautions used, chloraprep       Needles:  Injection technique: Single-shot  Needle Type: Echogenic Stimulator Needle     Needle Length: 10cm      Additional Needles:   Procedures:,,,, ultrasound used (permanent image in chart),,,,  Narrative:  Start time: 10/23/2018 7:05 AM End time: 10/23/2018 7:15 AM Injection made incrementally with aspirations every 5 mL.  Performed by: Personally  Anesthesiologist: Montez Hageman, MD  Additional Notes: Risks, benefits and alternative to block explained extensively.  Patient tolerated procedure well, without complications.

## 2018-10-23 NOTE — Brief Op Note (Signed)
10/23/2018  9:13 AM  PATIENT:  Cheryl Cooper  78 y.o. female  PRE-OPERATIVE DIAGNOSIS:  Left shoulder cuff arthropathy and pain  POST-OPERATIVE DIAGNOSIS:  Left shoulder cuff arthropathy and pain  PROCEDURE:  Procedure(s) with comments: REVERSE TOTAL SHOULDER ARTHROPLASTY (Left) - interscalene block DePuy Delta Xtend  SURGEON:  Surgeon(s) and Role:    Netta Cedars, MD - Primary  PHYSICIAN ASSISTANT:   ASSISTANTS: [place] Bruns, PA-C   ANESTHESIA:   regional and general  EBL:  75 mL   BLOOD ADMINISTERED:none  DRAINS: none   LOCAL MEDICATIONS USED:  MARCAINE     SPECIMEN:  No Specimen  DISPOSITION OF SPECIMEN:  N/A  COUNTS:  YES  TOURNIQUET:  * No tourniquets in log *  DICTATION: .Other Dictation: Dictation Number [id number]  PLAN OF CARE: Admit to inpatient   PATIENT DISPOSITION:  PACU - hemodynamically stable.   Delay start of Pharmacological VTE agent (>24hrs) due to surgical blood loss or risk of bleeding: not applicable

## 2018-10-23 NOTE — Anesthesia Postprocedure Evaluation (Signed)
Anesthesia Post Note  Patient: Cheryl Cooper  Procedure(s) Performed: REVERSE TOTAL SHOULDER ARTHROPLASTY (Left Shoulder)     Patient location during evaluation: PACU Anesthesia Type: General Level of consciousness: awake and alert Pain management: pain level controlled Vital Signs Assessment: post-procedure vital signs reviewed and stable Respiratory status: spontaneous breathing, nonlabored ventilation, respiratory function stable and patient connected to nasal cannula oxygen Cardiovascular status: blood pressure returned to baseline and stable Postop Assessment: no apparent nausea or vomiting Anesthetic complications: no    Last Vitals:  Vitals:   10/23/18 1230 10/23/18 1338  BP: (P) 128/60 123/64  Pulse: (P) 69 76  Resp: (P) 16 16  Temp: (P) 36.6 C 36.5 C  SpO2: (P) 90% 97%    Last Pain:  Vitals:   10/23/18 1338  TempSrc: Oral  PainSc:                  Montez Hageman

## 2018-10-23 NOTE — Progress Notes (Signed)

## 2018-10-23 NOTE — Plan of Care (Signed)

## 2018-10-24 LAB — BASIC METABOLIC PANEL
Anion gap: 11 (ref 5–15)
BUN: 11 mg/dL (ref 8–23)
CO2: 22 mmol/L (ref 22–32)
Calcium: 8.9 mg/dL (ref 8.9–10.3)
Chloride: 106 mmol/L (ref 98–111)
Creatinine, Ser: 0.85 mg/dL (ref 0.44–1.00)
GFR calc Af Amer: >60 mL/min (ref >60)
GFR calc non Af Amer: >60 mL/min (ref >60)
Glucose, Bld: 151 mg/dL — ABNORMAL HIGH (ref 70–99)
Potassium: 4.2 mmol/L (ref 3.5–5.1)
Sodium: 139 mmol/L (ref 135–145)

## 2018-10-24 LAB — HEMOGLOBIN AND HEMATOCRIT, BLOOD
HCT: 34.5 % — ABNORMAL LOW (ref 36.0–46.0)
Hemoglobin: 10.9 g/dL — ABNORMAL LOW (ref 12.0–15.0)

## 2018-10-24 MED ORDER — ASPIRIN 81 MG PO CHEW: 81.0000 mg | CHEWABLE_TABLET | Freq: Two times a day (BID) | ORAL | 0 refills | Status: AC

## 2018-10-24 NOTE — Progress Notes (Signed)
Discharge instructions reviewed with patient utilizing teach back method no questions at this time patient being discharged to home

## 2018-10-24 NOTE — Evaluation (Signed)
Occupational Therapy Evaluation Patient Details Name: Cheryl Cooper MRN: [id number] DOB: [birth year] Today's Date: 10/24/2018    History of Present Illness 78 yo female s/p L reverse total shoulder arthroplasty   Clinical Impression   Pt admitted with above diagnoses, seen for OT eval in regards to L TSA. PTA pt ind with BADLs and endorses familiarity with prior shoulder surgeries. Reviewed and discussed shoulder education described below. Educated pt on hand/wrist/elbow exercises. Pt completing functional mobility without device at supervision level. She lives with husband and he is able to assist, pt ind in directing (mostly for sling donning/doffing while still numb from nerve block). Recommendations below, will cont to see while acute.    Follow Up Recommendations  Follow surgeon's recommendation for DC plan and follow-up therapies    Equipment Recommendations  None recommended by OT    Recommendations for Other Services       Precautions / Restrictions Precautions Precautions: Fall;Shoulder Shoulder Interventions: Shoulder sling/immobilizer;Off for dressing/bathing/exercises Precaution Booklet Issued: Yes (comment) Precaution Comments: FF: 90; ABD: 60; ER: 30; okay for ADL and wrist/hand/elbow Restrictions Weight Bearing Restrictions: Yes LUE Weight Bearing: Non weight bearing      Mobility Bed Mobility Overal bed mobility: Modified Independent                Transfers Overall transfer level: Needs assistance Equipment used: None Transfers: Sit to/from Stand Sit to Stand: Min guard         General transfer comment: min guard for safety and cueing for LUE NWB    Balance Overall balance assessment: Mild deficits observed, not formally tested(one LOB but self corrected when turning head while walking)                                         ADL either performed or assessed with clinical judgement   ADL Overall ADL's : Needs  assistance/impaired Eating/Feeding: Set up;Sitting   Grooming: Set up;Sitting   Upper Body Bathing: Minimal assistance;Sitting;Cueing for UE precautions;Adhering to UE precautions Upper Body Bathing Details (indicate cue type and reason): educated on dangle method Lower Body Bathing: Minimal assistance;Sit to/from stand;Sitting/lateral leans   Upper Body Dressing : Minimal assistance;Sitting;Cueing for UE precautions;Adhering to UE precautions   Lower Body Dressing: Minimal assistance;Sit to/from stand;Sitting/lateral leans   Toilet Transfer: Mining engineer and Hygiene: Modified independent   Tub/ Banker: Supervision/safety;Shower seat   Functional mobility during ADLs: Min guard General ADL Comments: pt seen for R TSA implications for BADL     Vision Baseline Vision/History: No visual deficits Patient Visual Report: No change from baseline       Perception     Praxis      Pertinent Vitals/Pain Pain Assessment: No/denies pain(cont to be numb from nerve block)     Hand Dominance     Extremity/Trunk Assessment Upper Extremity Assessment Upper Extremity Assessment: LUE deficits/detail;Overall WFL for tasks assessed LUE Deficits / Details: s/p rev TSA, reamins numb from nerve block   Lower Extremity Assessment Lower Extremity Assessment: Overall WFL for tasks assessed       Communication Communication Communication: No difficulties   Cognition Arousal/Alertness: Awake/alert Behavior During Therapy: WFL for tasks assessed/performed Overall Cognitive Status: Within Functional Limits for tasks assessed  General Comments       Exercises Shoulder Exercises Shoulder Flexion: PROM;Left;5 reps Shoulder ABduction: PROM;Left;5 reps Shoulder External Rotation: PROM;Left;5 reps Elbow Flexion: AAROM;AROM;10 reps;Seated;Other (comment)(numb) Elbow Extension:  AROM;AAROM;10 reps;Seated;Other (comment)(numb) Wrist Flexion: AROM;AAROM;10 reps;Seated;Other (comment)(numb) Wrist Extension: AROM;AAROM;10 reps;Seated;Other (comment)(numb) Digit Composite Flexion: AROM;AAROM;Seated;Other (comment)(numb) Composite Extension: AROM;AAROM;Seated;Other (comment)(numb) Neck Flexion: AROM;5 reps;Seated Neck Extension: AROM;5 reps;Seated Neck Lateral Flexion - Right: AROM;5 reps;Seated Neck Lateral Flexion - Left: AROM;5 reps;Seated   Shoulder Instructions Shoulder Instructions Donning/doffing shirt without moving shoulder: Set-up Method for sponge bathing under operated UE: Supervision/safety Donning/doffing sling/immobilizer: Minimal assistance;Patient able to independently direct caregiver Correct positioning of sling/immobilizer: Minimal assistance;Patient able to independently direct caregiver ROM for elbow, wrist and digits of operated UE: Minimal assistance(pt still numb from nerve block) Sling wearing schedule (on at all times/off for ADL's): Modified independent Proper positioning of operated UE when showering: Supervision/safety Positioning of UE while sleeping: [place] expects to be discharged to:: Private residence Living Arrangements: Spouse/significant other Available Help at Discharge: Family Type of Home: House Home Access: Stairs to enter Technical brewer of Steps: 2   Home Layout: One level     Bathroom Shower/Tub: [place]: Environmental consultant - 2 wheels;Walker - 4 wheels;Bedside commode;Shower seat          Prior Functioning/Environment Level of Independence: Independent                 OT Problem List: Decreased strength;Decreased knowledge of use of DME or AE;Decreased range of motion;Impaired UE functional use;Pain      OT Treatment/Interventions:      OT Goals(Current goals can be found in the care plan section) Acute Rehab OT Goals Patient  Stated Goal: to return to ind OT Goal Formulation: With patient Time For Goal Achievement: 11/07/18 Potential to Achieve Goals: Good  OT Frequency:     Barriers to D/C:            Co-evaluation              AM-PAC OT "6 Clicks" Daily Activity     Outcome Measure Help from another person eating meals?: A Little Help from another person taking care of personal grooming?: A Little Help from another person toileting, which includes using toliet, bedpan, or urinal?: None Help from another person bathing (including washing, rinsing, drying)?: A Little Help from another person to put on and taking off regular upper body clothing?: A Little Help from another person to put on and taking off regular lower body clothing?: A Little 6 Click Score: 19   End of Session Equipment Utilized During Treatment: Gait belt;Other (comment)(sling) Nurse Communication: Mobility status  Activity Tolerance: Patient tolerated treatment well Patient left: in chair;with call bell/phone within reach  OT Visit Diagnosis: Pain;Other abnormalities of gait and mobility (R26.89) Pain - Right/Left: Left Pain - part of body: Shoulder                Time: [id number] OT Time Calculation (min): 39 min Charges:  OT General Charges $OT Visit: 1 Visit OT Evaluation $OT Eval Low Complexity: 1 Low OT Treatments $Self Care/Home Management : 23-37 mins  Zenovia Jarred, MSOT, OTR/L Behavioral Health OT/ Acute Relief OT [facility] Office: [phone]  Zenovia Jarred 10/24/2018, 11:14 AM

## 2018-10-24 NOTE — Progress Notes (Signed)
   Subjective: 1 Day Post-Op Procedure(s) (LRB): REVERSE TOTAL SHOULDER ARTHROPLASTY (Left) Patient reports pain as mild.   Patient seen in rounds for Dr. Veverly Fells. Patient is well, and has had no acute complaints or problems other than left shoulder discomfort. Patient resting comfortably in bed on exam. No acute events overnight. Patient states she is ready to go home.  We will continue therapy today.   Objective: Vital signs in last 24 hours: Temp:  [97.5 F (36.4 C)-98.3 F (36.8 C)] 97.6 F (36.4 C) (09/05 1425) Pulse Rate:  [63-79] 70 (09/05 1425) Resp:  [16] 16 (09/05 1425) BP: (120-157)/(59-76) 157/65 (09/05 1425) SpO2:  [95 %-98 %] 97 % (09/05 1425)  Intake/Output from previous day:  Intake/Output Summary (Last 24 hours) at 10/24/2018 1454 Last data filed at 10/24/2018 0815 Gross per 24 hour  Intake 1553.33 ml  Output 2050 ml  Net -496.67 ml     Intake/Output this shift: Total I/O In: 120 [P.O.:120] Out: 200 [Urine:200]  Labs: Recent Labs    10/24/18 0240  HGB 10.9*   Recent Labs    10/24/18 0240  HCT 34.5*   Recent Labs    10/24/18 0240  NA 139  K 4.2  CL 106  CO2 22  BUN 11  CREATININE 0.85  GLUCOSE 151*  CALCIUM 8.9   No results for input(s): LABPT, INR in the last 72 hours.  Exam: General - Patient is Alert and Oriented Extremity - Neurologically intact Sensation intact distally Intact pulses distally  Dressing - dressing C/D/I Motor Function - intact, moving fingers well on exam.  Past Medical History:  Diagnosis Date  . Anxiety   . Carpal tunnel syndrome   . Deafness in left ear   . Depression   . Diabetes mellitus    diet controlled  . GERD (gastroesophageal reflux disease)   . Hypercholesteremia   . Hypertension   . Hypothyroid   . Memory loss    reports d/t concussion  . PONV (postoperative nausea and vomiting) yrs ago, none recent  . Post concussion syndrome   . Stroke [facility])    reports they were silent     Assessment/Plan: 1 Day Post-Op Procedure(s) (LRB): REVERSE TOTAL SHOULDER ARTHROPLASTY (Left) Active Problems:   H/O total shoulder replacement, left  Estimated body mass index is 25.47 kg/m as calculated from the following:   Height as of this encounter: 5' 3.5" (1.613 m).   Weight as of this encounter: 66.3 kg. Advance diet Up with therapy D/C IV fluids  DVT Prophylaxis - Aspirin D/C O2 and pulse ox and try on room air.  Plan is to go Home after hospital stay. Patient states she is ready to go home today. She will follow up in the office with Dr. Veverly Fells in 2 weeks.   Griffith Citron, PA-C Orthopedic Surgery 10/24/2018, 2:54 PM

## 2018-10-28 NOTE — Discharge Summary (Signed)
Orthopedic Discharge Summary        Physician Discharge Summary  Patient ID: Cheryl Cooper MRN: [id number] DOB/AGE: 76-Aug-1942 76 y.o.  Admit date: 10/23/2018 Discharge date: 10/24/18   Procedures:  Procedure(s) (LRB): REVERSE TOTAL SHOULDER ARTHROPLASTY (Left)  Attending Physician:  Dr. Esmond Plants  Admission Diagnoses:   Left shoulder cuff arthropathy  Discharge Diagnoses:  Left shoulder cuff arthropathy   Past Medical History:  Diagnosis Date  . Anxiety   . Carpal tunnel syndrome   . Deafness in left ear   . Depression   . Diabetes mellitus    diet controlled  . GERD (gastroesophageal reflux disease)   . Hypercholesteremia   . Hypertension   . Hypothyroid   . Memory loss    reports d/t concussion  . PONV (postoperative nausea and vomiting) yrs ago, none recent  . Post concussion syndrome   . Stroke [facility])    reports they were silent    PCP: Wendie Agreste, MD   Discharged Condition: good  Hospital Course:  Patient underwent the above stated procedure on 10/23/2018. Patient tolerated the procedure well and brought to the recovery room in good condition and subsequently to the floor. Patient had an uncomplicated hospital course and was stable for discharge.   Disposition:  with follow up in 2 weeks   Follow-up Information    Netta Cedars, MD. Call in 2 weeks.   Specialty: Orthopedic Surgery Why: [phone] Contact information: [address] [place] [address] [phone]             Allergies as of 10/24/2018      Reactions   Other Other (See Comments)   Tomato sauce, garlic, onion - severe acid reflux    Flexeril [cyclobenzaprine] Other (See Comments)   Per spouse "she felt like a zombie"   Atorvastatin Other (See Comments)   Unbalanced   Chlordiazepoxide-clidinium Other (See Comments)   Dizziness (intolerance)   Codeine Nausea Only      Medication List    STOP taking these medications   acetaminophen 500 MG  tablet Commonly known as: TYLENOL     TAKE these medications   alendronate 70 MG tablet Commonly known as: FOSAMAX Take 70 mg by mouth every Sunday. Take with a full glass of water on an empty stomach. Notes to patient: 10/25/18   amLODipine 5 MG tablet Commonly known as: NORVASC Take 1 tablet (5 mg total) by mouth daily. Notes to patient: 10/25/18   aspirin 81 MG chewable tablet Commonly known as: Aspirin Childrens Chew 1 tablet (81 mg total) by mouth 2 (two) times daily. Notes to patient: 10/25/18   CALCIUM-D PO Take 2 tablets by mouth daily. CHEWABLES Notes to patient: 10/25/18   escitalopram 20 MG tablet Commonly known as: LEXAPRO Take 20 mg by mouth daily. Notes to patient: 10/25/18   esomeprazole 40 MG capsule Commonly known as: NEXIUM Take 1 capsule (40 mg total) by mouth 2 (two) times daily before a meal. Notes to patient: 10/24/18   ezetimibe 10 MG tablet Commonly known as: ZETIA Take 1 tablet (10 mg total) by mouth daily. Notes to patient: 10/25/18   HYDROcodone-acetaminophen 5-325 MG tablet Commonly known as: Norco Take 1 tablet by mouth every 6 (six) hours as needed for severe pain (for breakthrough pain). Notes to patient: 10/24/18@ 10:10 pm   levothyroxine 75 MCG tablet Commonly known as: SYNTHROID Take 1 tablet (75 mcg total) by mouth daily with breakfast. Notes to patient: 10/25/18  methocarbamol 500 MG tablet Commonly known as: Robaxin Take 1 tablet (500 mg total) by mouth 3 (three) times daily as needed. Notes to patient: 10/24/18 @ 4:10 pm    oxybutynin 15 MG 24 hr tablet Commonly known as: Ditropan XL Take 1 tablet (15 mg total) by mouth at bedtime. Notes to patient: 10/24/18   simvastatin 20 MG tablet Commonly known as: ZOCOR Take 1 tablet (20 mg total) by mouth at bedtime. Notes to patient: 10/24/18   traMADol 50 MG tablet Commonly known as: Ultram Take 1-2 tablets (50-100 mg total) by mouth every 6 (six) hours as needed for moderate pain or severe  pain. Notes to patient: 10/24/18 @ 4:10 pm    traZODone 50 MG tablet Commonly known as: DESYREL Take 25 mg by mouth at bedtime. Notes to patient: 10/24/18    WOMENS MULTI GUMMIES PO Take 2 tablets by mouth daily. Notes to patient: 10/24/18         Signed: Ventura Bruns 10/28/2018, 8:21 AM  [facility] is now [employer] [address] [place], [place], [place] [address] Phone: [place]

## 2018-10-29 ENCOUNTER — Encounter (HOSPITAL_COMMUNITY): Payer: Self-pay | Admitting: Orthopedic Surgery

## 2018-11-16 ENCOUNTER — Other Ambulatory Visit: Payer: Self-pay

## 2018-11-16 ENCOUNTER — Ambulatory Visit (INDEPENDENT_AMBULATORY_CARE_PROVIDER_SITE_OTHER): Payer: Medicare Other | Admitting: Family Medicine

## 2018-11-16 DIAGNOSIS — Z23 Encounter for immunization: Secondary | ICD-10-CM

## 2018-11-26 ENCOUNTER — Encounter: Payer: Self-pay | Admitting: Gynecology

## 2018-12-16 ENCOUNTER — Other Ambulatory Visit: Payer: Self-pay | Admitting: Family Medicine

## 2018-12-16 DIAGNOSIS — I10 Essential (primary) hypertension: Secondary | ICD-10-CM

## 2018-12-16 NOTE — Telephone Encounter (Signed)
Medication Refill - Medication: alendronate (FOSAMAX) 70 MG tablet  Has the patient contacted their pharmacy? yes (Agent: If no, request that the patient contact the pharmacy for the refill.) (Agent: If yes, when and what did the pharmacy advise?)  Preferred Pharmacy (with phone number or street name):  [facility] at [place], [place] [phone] (Phone) [phone] (Fax)   Agent: Please be advised that RX refills may take up to 3 business days. We ask that you follow-up with your pharmacy.

## 2018-12-16 NOTE — Telephone Encounter (Signed)
Requested medication (s) are due for refill today: yes  Requested medication (s) are on the active medication list: yes  Last refill:  05/2017  Future visit scheduled: yes  Notes to clinic:  Last filled by historical provider   Requested Prescriptions  Pending Prescriptions Disp Refills   alendronate (FOSAMAX) 70 MG tablet       Sig: Take 1 tablet (70 mg total) by mouth every Sunday. Take with a full glass of water on an empty stomach.     Endocrinology:  Bisphosphonates Failed - 12/16/2018 10:04 AM      Failed - Vitamin D in normal range and within 360 days    No results found for: [id number], [id number], [id number], 25OHVITD3, 25OHVITD2, 25OHVITD3, 25OHVITD2, 25OHVITD1, 25OHVITD2, 25OHVITD3, VD25OH       Passed - Ca in normal range and within 360 days    Calcium  Date Value Ref Range Status  10/24/2018 8.9 8.9 - 10.3 mg/dL Final         Passed - Valid encounter within last 12 months    Recent Outpatient Visits          1 month ago Need for influenza vaccination   [facility] [place], Ranell Patrick, MD   2 months ago Medicare annual wellness visit, subsequent   [facility] [facility], Ranell Patrick, MD   2 months ago History of CVA in adulthood   [facility] [place] [facility], Ranell Patrick, MD   4 months ago Essential hypertension   [facility], Ranell Patrick, MD   6 months ago Essential hypertension   [facility], Ranell Patrick, MD      Future Appointments            In 1 month Carlota Raspberry Ranell Patrick, MD [facility] at [place], [facility]

## 2018-12-23 MED ORDER — ALENDRONATE SODIUM 70 MG PO TABS: 70.0000 mg | ORAL_TABLET | ORAL | 3 refills | Status: DC

## 2019-01-13 ENCOUNTER — Other Ambulatory Visit: Payer: Self-pay | Admitting: Family Medicine

## 2019-01-13 DIAGNOSIS — N3281 Overactive bladder: Secondary | ICD-10-CM

## 2019-01-13 DIAGNOSIS — R350 Frequency of micturition: Secondary | ICD-10-CM

## 2019-01-20 ENCOUNTER — Other Ambulatory Visit: Payer: Self-pay | Admitting: Family Medicine

## 2019-01-20 DIAGNOSIS — E039 Hypothyroidism, unspecified: Secondary | ICD-10-CM

## 2019-01-20 NOTE — Telephone Encounter (Signed)
Requested Prescriptions  Pending Prescriptions Disp Refills  . levothyroxine (SYNTHROID) 75 MCG tablet [Pharmacy Med Name: LEVOTHYROXINE 75 MCG TABLET] 90 tablet 0    Sig: TAKE ONE TABLET BY MOUTH DAILY WITH BREAKFAST     Endocrinology:  Hypothyroid Agents Failed - 01/20/2019 11:14 AM      Failed - TSH needs to be rechecked within 3 months after an abnormal result. Refill until TSH is due.      Passed - TSH in normal range and within 360 days    TSH  Date Value Ref Range Status  07/21/2018 3.610 0.450 - 4.500 uIU/mL Final         Passed - Valid encounter within last 12 months    Recent Outpatient Visits          2 months ago Need for influenza vaccination   [facility], Ranell Patrick, MD   3 months ago Medicare annual wellness visit, subsequent   [facility], Ranell Patrick, MD   3 months ago History of CVA in adulthood   [facility] [place] [facility], Ranell Patrick, MD   6 months ago Essential hypertension   [facility], Ranell Patrick, MD   8 months ago Essential hypertension   [facility] [place], Ranell Patrick, MD      Future Appointments            In 5 days Wendie Agreste, MD [facility] at [place], [facility]

## 2019-01-22 ENCOUNTER — Ambulatory Visit: Payer: Medicare Other | Admitting: Family Medicine

## 2019-01-25 ENCOUNTER — Encounter: Payer: Self-pay | Admitting: Family Medicine

## 2019-01-25 ENCOUNTER — Ambulatory Visit: Payer: Medicare Other | Admitting: Family Medicine

## 2019-01-25 ENCOUNTER — Other Ambulatory Visit: Payer: Self-pay

## 2019-01-25 VITALS — BP 140/68 | HR 66 | Temp 97.9°F | Wt 146.2 lb

## 2019-01-25 DIAGNOSIS — N3281 Overactive bladder: Secondary | ICD-10-CM

## 2019-01-25 DIAGNOSIS — E039 Hypothyroidism, unspecified: Secondary | ICD-10-CM | POA: Diagnosis not present

## 2019-01-25 DIAGNOSIS — I10 Essential (primary) hypertension: Secondary | ICD-10-CM

## 2019-01-25 DIAGNOSIS — M810 Age-related osteoporosis without current pathological fracture: Secondary | ICD-10-CM

## 2019-01-25 DIAGNOSIS — E119 Type 2 diabetes mellitus without complications: Secondary | ICD-10-CM | POA: Diagnosis not present

## 2019-01-25 DIAGNOSIS — E785 Hyperlipidemia, unspecified: Secondary | ICD-10-CM

## 2019-01-25 NOTE — Progress Notes (Signed)
Subjective:  Patient ID: Cheryl Cooper, female    DOB: Jul 22, 1940  Age: 78 y.o. MRN: [id number]  CC:  Chief Complaint  Patient presents with  . Medical Management of Chronic Issues    6 month f/u on chronic medical condition    HPI Cheryl Cooper presents for chronic medical problems/medication review.  Last seen in August for preoperative clearance for left shoulder replacement. Improving. Still undergoing therapy.  Brother in law passed yesterday, but a blessing with his health history.  Water aerobics on hold while shoulder healing.   Hypertension: With remote history of brainstem CVA. Takes amlodipine 5 mg daily. Home readings: on recent readings.  BP Readings from Last 3 Encounters:  01/25/19 140/68  10/24/18 (!) 157/65  10/21/18 (!) 148/83   Lab Results  Component Value Date   CREATININE 0.85 10/24/2018   Hypothyroidism: Lab Results  Component Value Date   TSH 3.610 07/21/2018   Taking medication daily.  Synthroid 75 mcg daily.  Followed by endocrinology.   No new hot or cold intolerance. No new hair or skin changes, heart palpitations or new fatigue. No recent new weight changes.   Overactive bladder Treated with Ditropan XL 15 mg daily. Working well.   Osteoporosis: Takes Fosamax 70 mg once per week. Last tested few years ago. Would like to discuss shot.   Hyperlipidemia: Simvastatin 20 mg daily, Zetia 10 mg daily. No new myalgias.  Secondary prevention with previous brainstem stroke.  LDL under 70 in June. Lab Results  Component Value Date   CHOL 166 07/21/2018   HDL 47 07/21/2018   LDLCALC 67 07/21/2018   TRIG 261 (H) 07/21/2018   CHOLHDL 3.5 07/21/2018   Lab Results  Component Value Date   ALT 27 07/21/2018   AST 28 07/21/2018   ALKPHOS 36 (L) 07/21/2018   BILITOT <0.2 07/21/2018    Diabetes: Diet controlled, followed by [facility] prior, would like to  have followed.    Microalbumin, Optho, foot exam, pneumovax:  Up-to-date  Lab Results  Component Value Date   HGBA1C 6.6 (H) 10/21/2018   HGBA1C 6.6 (H) 01/11/2018   HGBA1C 6.6 (H) 12/23/2017   Lab Results  Component Value Date   LDLCALC 67 07/21/2018   CREATININE 0.85 10/24/2018      History Patient Active Problem List   Diagnosis Date Noted  . H/O total shoulder replacement, left 10/23/2018  . PUD (peptic ulcer disease) 03/21/2018  . Nausea and vomiting 01/11/2018  . Synovial cyst of lumbar facet joint 12/23/2017  . Osteoarthritis of right hip 04/24/2017  . Primary osteoarthritis of right hip 04/24/2017  . Complete rotator cuff tear 05/09/2016  . Brainstem stroke ([place]) 01/04/2015  . Dizziness and giddiness 01/04/2015  . Post concussion syndrome 01/04/2015  . Rotator cuff tear, right 11/21/2011   Past Medical History:  Diagnosis Date  . Anxiety   . Carpal tunnel syndrome   . Deafness in left ear   . Depression   . Diabetes mellitus    diet controlled  . GERD (gastroesophageal reflux disease)   . Hypercholesteremia   . Hypertension   . Hypothyroid   . Memory loss    reports d/t concussion  . PONV (postoperative nausea and vomiting) yrs ago, none recent  . Post concussion syndrome   . Stroke [facility])    reports they were silent   Past Surgical History:  Procedure Laterality Date  . ABDOMINAL HYSTERECTOMY  1986  . BACK SURGERY  2010  lower  . BIOPSY  01/12/2018   Procedure: BIOPSY;  Surgeon: Rush Landmark Telford Nab., MD;  Location: [facility];  Service: Gastroenterology;;  . CHOLECYSTECTOMY    . ESOPHAGOGASTRODUODENOSCOPY (EGD) WITH PROPOFOL N/A 01/12/2018   Procedure: ESOPHAGOGASTRODUODENOSCOPY (EGD) WITH PROPOFOL;  Surgeon: Rush Landmark Telford Nab., MD;  Location: [facility];  Service: Gastroenterology;  Laterality: N/A;  . left elobow surgery  2003  . left rotator cuff  2001  . LUMBAR LAMINECTOMY/DECOMPRESSION MICRODISCECTOMY Left 12/23/2017   Procedure: Laminectomy for facet/synovial cyst - left - Lumbar  three-Lumbar four;  Surgeon: Earnie Larsson, MD;  Location: [place];  Service: Neurosurgery;  Laterality: Left;  . r foot surgery  1995  . REVERSE SHOULDER ARTHROPLASTY Left 10/23/2018   Procedure: REVERSE TOTAL SHOULDER ARTHROPLASTY;  Surgeon: Netta Cedars, MD;  Location: [facility];  Service: Orthopedics;  Laterality: Left;  interscalene block  . right hand surgery  2001  . right index finger surgery  2009  . SHOULDER OPEN ROTATOR CUFF REPAIR  11/21/2011   Procedure: ROTATOR CUFF REPAIR SHOULDER OPEN;  Surgeon: Johnn Hai, MD;  Location: [facility];  Service: Orthopedics;  Laterality: Right;  with subacromial decompression  . SHOULDER OPEN ROTATOR CUFF REPAIR Right 05/09/2016   Procedure: Right shoulder mini open revision rotator cuff repair, subacromial decompression;  Surgeon: Susa Day, MD;  Location: [facility];  Service: Orthopedics;  Laterality: Right;  . TOTAL HIP ARTHROPLASTY Right 04/24/2017   Procedure: RIGHT TOTAL HIP ARTHROPLASTY ANTERIOR APPROACH;  Surgeon: Rod Can, MD;  Location: [facility];  Service: Orthopedics;  Laterality: Right;  Needs RNFA   Allergies  Allergen Reactions  . Other Other (See Comments)    Tomato sauce, garlic, onion - severe acid reflux   . Flexeril [Cyclobenzaprine] Other (See Comments)    Per spouse "she felt like a zombie"  . Atorvastatin Other (See Comments)    Unbalanced  . Chlordiazepoxide-Clidinium Other (See Comments)    Dizziness (intolerance)  . Codeine Nausea Only   Prior to Admission medications   Medication Sig Start Date End Date Taking? Authorizing Provider  alendronate (FOSAMAX) 70 MG tablet Take 1 tablet (70 mg total) by mouth every Sunday. Take with a full glass of water on an empty stomach. 12/27/18  Yes Wendie Agreste, MD  amLODipine (NORVASC) 5 MG tablet Take 1 tablet (5 mg total) by mouth daily. 07/21/18  Yes Wendie Agreste, MD  Calcium Carbonate-Vitamin D (CALCIUM-D PO) Take 2 tablets by mouth daily. CHEWABLES   Yes [provider]  escitalopram (LEXAPRO) 20 MG tablet Take 20 mg by mouth daily.   Yes [provider]  esomeprazole (NEXIUM) 40 MG capsule Take 1 capsule (40 mg total) by mouth 2 (two) times daily before a meal. 01/13/18  Yes Mikhail, [place], DO  ezetimibe (ZETIA) 10 MG tablet Take 1 tablet (10 mg total) by mouth daily. 07/21/18  Yes Wendie Agreste, MD  levothyroxine (SYNTHROID) 75 MCG tablet TAKE ONE TABLET BY MOUTH DAILY WITH BREAKFAST 01/20/19  Yes Wendie Agreste, MD  methocarbamol (ROBAXIN) 500 MG tablet Take 1 tablet (500 mg total) by mouth 3 (three) times daily as needed. 10/23/18  Yes Netta Cedars, MD  Multiple Vitamins-Minerals (WOMENS MULTI GUMMIES PO) Take 2 tablets by mouth daily.   Yes [provider]  oxybutynin (DITROPAN XL) 15 MG 24 hr tablet TAKE ONE TABLET BY MOUTH EVERY NIGHT AT BEDTIME 01/13/19  Yes Wendie Agreste, MD  simvastatin (ZOCOR) 20 MG tablet Take 1 tablet (20 mg  total) by mouth at bedtime. 07/21/18  Yes Wendie Agreste, MD  traMADol (ULTRAM) 50 MG tablet Take 1-2 tablets (50-100 mg total) by mouth every 6 (six) hours as needed for moderate pain or severe pain. 10/23/18 10/23/19 Yes Netta Cedars, MD  traZODone (DESYREL) 50 MG tablet Take 25 mg by mouth at bedtime.    Yes [provider]   Social History   Socioeconomic History  . Marital status: Married    Spouse name: Herschel  . Number of children: 3  . Years of education: 72  . Highest education level: Not on file  Occupational History    Comment: retired  Scientific laboratory technician  . Financial resource strain: Not on file  . Food insecurity    Worry: Not on file    Inability: Not on file  . Transportation needs    Medical: Not on file    Non-medical: Not on file  Tobacco Use  . Smoking status: Never Smoker  . Smokeless tobacco: Never Used  Substance and Sexual Activity  . Alcohol use: Yes    Alcohol/week: 1.0 standard drinks    Types: 1 Glasses of wine per week    Comment: occas  .  Drug use: No  . Sexual activity: Not Currently    Comment: 1st intercourse 78 yo-Fewer than 5 partners  Lifestyle  . Physical activity    Days per week: Not on file    Minutes per session: Not on file  . Stress: Not on file  Relationships  . Social Herbalist on phone: Not on file    Gets together: Not on file    Attends religious service: Not on file    Active member of club or organization: Not on file    Attends meetings of clubs or organizations: Not on file    Relationship status: Not on file  . Intimate partner violence    Fear of current or ex partner: Not on file    Emotionally abused: Not on file    Physically abused: Not on file    Forced sexual activity: Not on file  Other Topics Concern  . Not on file  Social History Narrative   Married, lives at home with husband   caffeine - 1 Coke daily    Review of Systems  Constitutional: Negative for fatigue and unexpected weight change.  Respiratory: Negative for chest tightness and shortness of breath.   Cardiovascular: Negative for chest pain, palpitations and leg swelling.  Gastrointestinal: Negative for abdominal pain and blood in stool.  Neurological: Negative for dizziness, syncope, light-headedness and headaches.     Objective:   Vitals:   01/25/19 0941  BP: 140/68  Pulse: 66  Temp: 97.9 F (36.6 C)  TempSrc: Oral  SpO2: 97%  Weight: 146 lb 3.2 oz (66.3 kg)     Physical Exam Vitals signs reviewed.  Constitutional:      Appearance: She is well-developed.  HENT:     Head: Normocephalic and atraumatic.  Eyes:     Conjunctiva/sclera: Conjunctivae normal.     Pupils: Pupils are equal, round, and reactive to light.  Neck:     Vascular: No carotid bruit.  Cardiovascular:     Rate and Rhythm: Normal rate and regular rhythm.     Heart sounds: Normal heart sounds.  Pulmonary:     Effort: Pulmonary effort is normal.     Breath sounds: Normal breath sounds.  Abdominal:     Palpations:  Abdomen is  soft. There is no pulsatile mass.     Tenderness: There is no abdominal tenderness.  Skin:    General: Skin is warm and dry.  Neurological:     Mental Status: She is alert and oriented to person, place, and time.  Psychiatric:        Behavior: Behavior normal.        Assessment & Plan:  EEVIE GRELLA is a 78 y.o. female . Essential hypertension - Plan: Comprehensive metabolic panel  -Borderline, would like to see systolic slightly lower with history of CVA.  Home monitoring recommended with update by MyChart.  Continue same regimen for now  Type 2 diabetes, diet controlled ([place]) - Plan: Hemoglobin A1c  -Diet controlled, previously.  Check A1c as she potentially may have follow-up with me.  Hypothyroidism, unspecified type - Plan: TSH  Denies new symptoms.  Continue same regimen, labs pending.  Overactive bladder  -Stable, continue Ditropan.  Potential side effects and RTC precautions given.  Hyperlipidemia, unspecified hyperlipidemia type - Plan: Comprehensive metabolic panel, Lipid Panel  -Tolerating current regimen.  May need to evaluate different antihypertensive medicine if higher dose needed with current use of simvastatin.  Osteoporosis without current pathological fracture, unspecified osteoporosis type  -Tolerating Fosamax at current dose.  Would like to discuss injectable, will have her follow-up with endocrinology to discuss that further and may also need updated testing.  No orders of the defined types were placed in this encounter.  Patient Instructions    Keep a record of your blood pressures outside of the office and send me a record to decide if other med needed (will need to be cautious with amlodipine and simvastatin combination.    Call endocrinologist (Dr. Tamala Julian) to discuss osteoporosis and option of injection.   Recheck in 6 months. Take care.        If you have lab work done today you will be contacted with your lab results within  the next 2 weeks.  If you have not heard from [place] then please contact us. The fastest way to get your results is to register for My Chart.   IF you received an x-ray today, you will receive an invoice from [facility]. Please contact [facility] at [phone] with questions or concerns regarding your invoice.   IF you received labwork today, you will receive an invoice from [facility]. Please contact [facility] at [phone] with questions or concerns regarding your invoice.   Our billing staff will not be able to assist you with questions regarding bills from these companies.  You will be contacted with the lab results as soon as they are available. The fastest way to get your results is to activate your My Chart account. Instructions are located on the last page of this paperwork. If you have not heard from [place] regarding the results in 2 weeks, please contact this office.          Signed, Merri Ray, MD [facility]

## 2019-01-25 NOTE — Patient Instructions (Addendum)
  Keep a record of your blood pressures outside of the office and send me a record to decide if other med needed (will need to be cautious with amlodipine and simvastatin combination.    Call endocrinologist (Dr. Tamala Julian) to discuss osteoporosis and option of injection.   Recheck in 6 months. Take care.        If you have lab work done today you will be contacted with your lab results within the next 2 weeks.  If you have not heard from [place] then please contact us. The fastest way to get your results is to register for My Chart.   IF you received an x-ray today, you will receive an invoice from [facility]. Please contact [facility] at [phone] with questions or concerns regarding your invoice.   IF you received labwork today, you will receive an invoice from [facility]. Please contact [facility] at [phone] with questions or concerns regarding your invoice.   Our billing staff will not be able to assist you with questions regarding bills from these companies.  You will be contacted with the lab results as soon as they are available. The fastest way to get your results is to activate your My Chart account. Instructions are located on the last page of this paperwork. If you have not heard from [place] regarding the results in 2 weeks, please contact this office.

## 2019-01-26 ENCOUNTER — Other Ambulatory Visit: Payer: Self-pay | Admitting: Family Medicine

## 2019-01-26 DIAGNOSIS — E785 Hyperlipidemia, unspecified: Secondary | ICD-10-CM

## 2019-01-26 LAB — LIPID PANEL
Chol/HDL Ratio: 3.3 ratio (ref 0.0–4.4)
Cholesterol, Total: 172 mg/dL (ref 100–199)
HDL: 52 mg/dL (ref >39)
LDL Chol Calc (NIH): 83 mg/dL (ref 0–99)
Triglycerides: 223 mg/dL — ABNORMAL HIGH (ref 0–149)
VLDL Cholesterol Cal: 37 mg/dL (ref 5–40)

## 2019-01-26 LAB — COMPREHENSIVE METABOLIC PANEL
ALT: 27 IU/L (ref 0–32)
AST: 25 IU/L (ref 0–40)
Albumin/Globulin Ratio: 1.5 (ref 1.2–2.2)
Albumin: 4.3 g/dL (ref 3.7–4.7)
Alkaline Phosphatase: 50 IU/L (ref 39–117)
BUN/Creatinine Ratio: 13 (ref 12–28)
BUN: 12 mg/dL (ref 8–27)
Bilirubin Total: <0.2 mg/dL (ref 0.0–1.2)
CO2: 24 mmol/L (ref 20–29)
Calcium: 9.8 mg/dL (ref 8.7–10.3)
Chloride: 102 mmol/L (ref 96–106)
Creatinine, Ser: 0.89 mg/dL (ref 0.57–1.00)
GFR calc Af Amer: 73 mL/min/{1.73_m2} (ref >59)
GFR calc non Af Amer: 63 mL/min/{1.73_m2} (ref >59)
Globulin, Total: 2.9 g/dL (ref 1.5–4.5)
Glucose: 137 mg/dL — ABNORMAL HIGH (ref 65–99)
Potassium: 4.2 mmol/L (ref 3.5–5.2)
Sodium: 140 mmol/L (ref 134–144)
Total Protein: 7.2 g/dL (ref 6.0–8.5)

## 2019-01-26 LAB — HEMOGLOBIN A1C
Est. average glucose Bld gHb Est-mCnc: 146 mg/dL
Hgb A1c MFr Bld: 6.7 % — ABNORMAL HIGH (ref 4.8–5.6)

## 2019-01-26 LAB — TSH: TSH: 1.85 u[IU]/mL (ref 0.450–4.500)

## 2019-01-26 NOTE — Telephone Encounter (Signed)
Requested medication (s) are due for refill today: yes  Requested medication (s) are on the active medication list: yes  Last refill:  07/21/2018  Future visit scheduled: yes  Notes to clinic:  antilipids statins failed  Requested Prescriptions  Pending Prescriptions Disp Refills   simvastatin (ZOCOR) 20 MG tablet [Pharmacy Med Name: SIMVASTATIN 20 MG TABLET] 90 tablet 0    Sig: TAKE ONE TABLET BY MOUTH EVERY NIGHT AT BEDTIME     Cardiovascular:  Antilipid - Statins Failed - 01/26/2019  4:59 PM      Failed - Triglycerides in normal range and within 360 days    Triglycerides  Date Value Ref Range Status  01/25/2019 223 (H) 0 - 149 mg/dL Final         Passed - Total Cholesterol in normal range and within 360 days    Cholesterol, Total  Date Value Ref Range Status  01/25/2019 172 100 - 199 mg/dL Final         Passed - LDL in normal range and within 360 days    LDL Chol Calc (NIH)  Date Value Ref Range Status  01/25/2019 83 0 - 99 mg/dL Final         Passed - HDL in normal range and within 360 days    HDL  Date Value Ref Range Status  01/25/2019 52 >39 mg/dL Final         Passed - Patient is not pregnant      Passed - Valid encounter within last 12 months    Recent Outpatient Visits          Yesterday Essential hypertension   [facility], Ranell Patrick, MD   2 months ago Need for influenza vaccination   [facility], Ranell Patrick, MD   3 months ago Medicare annual wellness visit, subsequent   [facility], Ranell Patrick, MD   4 months ago History of CVA in adulthood   [facility], Ranell Patrick, MD   6 months ago Essential hypertension   [facility], Ranell Patrick, MD      Future Appointments            In 6 months Carlota Raspberry Ranell Patrick, MD [facility] [place], [facility]            ezetimibe (ZETIA) 10 MG tablet [Pharmacy Med Name: EZETIMIBE 10 MG TABLET] 90 tablet 0    Sig: TAKE ONE TABLET BY  MOUTH DAILY     Cardiovascular:  Antilipid - Sterol Transport Inhibitors Failed - 01/26/2019  4:59 PM      Failed - Triglycerides in normal range and within 360 days    Triglycerides  Date Value Ref Range Status  01/25/2019 223 (H) 0 - 149 mg/dL Final         Passed - Total Cholesterol in normal range and within 360 days    Cholesterol, Total  Date Value Ref Range Status  01/25/2019 172 100 - 199 mg/dL Final         Passed - LDL in normal range and within 360 days    LDL Chol Calc (NIH)  Date Value Ref Range Status  01/25/2019 83 0 - 99 mg/dL Final         Passed - HDL in normal range and within 360 days    HDL  Date Value Ref Range Status  01/25/2019 52 >39 mg/dL Final  Passed - Valid encounter within last 12 months    Recent Outpatient Visits          Yesterday Essential hypertension   [facility], Ranell Patrick, MD   2 months ago Need for influenza vaccination   [facility], Ranell Patrick, MD   3 months ago Medicare annual wellness visit, subsequent   [facility], Ranell Patrick, MD   4 months ago History of CVA in adulthood   [facility], Ranell Patrick, MD   6 months ago Essential hypertension   [facility], Ranell Patrick, MD      Future Appointments            In 6 months Carlota Raspberry Ranell Patrick, MD [facility] at [place], [facility]

## 2019-02-22 ENCOUNTER — Encounter: Payer: Self-pay | Admitting: Family Medicine

## 2019-02-25 DIAGNOSIS — M25511 Pain in right shoulder: Secondary | ICD-10-CM | POA: Diagnosis not present

## 2019-02-25 DIAGNOSIS — Z471 Aftercare following joint replacement surgery: Secondary | ICD-10-CM | POA: Diagnosis not present

## 2019-02-25 DIAGNOSIS — M19111 Post-traumatic osteoarthritis, right shoulder: Secondary | ICD-10-CM | POA: Diagnosis not present

## 2019-02-25 DIAGNOSIS — Z96612 Presence of left artificial shoulder joint: Secondary | ICD-10-CM | POA: Diagnosis not present

## 2019-03-01 ENCOUNTER — Encounter: Payer: Self-pay | Admitting: Family Medicine

## 2019-03-03 ENCOUNTER — Other Ambulatory Visit: Payer: Self-pay

## 2019-03-03 ENCOUNTER — Encounter: Payer: Self-pay | Admitting: Registered Nurse

## 2019-03-03 ENCOUNTER — Ambulatory Visit: Payer: Medicare PPO | Admitting: Registered Nurse

## 2019-03-03 VITALS — BP 127/67 | HR 64 | Temp 98.0°F | Wt 146.8 lb

## 2019-03-03 DIAGNOSIS — R5383 Other fatigue: Secondary | ICD-10-CM | POA: Diagnosis not present

## 2019-03-03 LAB — POCT URINALYSIS DIP (CLINITEK)
Bilirubin, UA: NEGATIVE
Blood, UA: NEGATIVE
Glucose, UA: NEGATIVE mg/dL
Ketones, POC UA: NEGATIVE mg/dL
Leukocytes, UA: NEGATIVE
Nitrite, UA: NEGATIVE
POC PROTEIN,UA: NEGATIVE
Spec Grav, UA: <=1.005 — AB (ref 1.010–1.025)
Urobilinogen, UA: 0.2 E.U./dL
pH, UA: 5.5 (ref 5.0–8.0)

## 2019-03-03 LAB — POCT CBC
Granulocyte percent: 60.9 %G (ref 37–80)
HCT, POC: 38.5 % (ref 29–41)
Hemoglobin: 12.4 g/dL (ref 11–14.6)
Lymph, poc: 2.4 (ref 0.6–3.4)
MCH, POC: 25.5 pg — AB (ref 27–31.2)
MCHC: 32.2 g/dL (ref 31.8–35.4)
MCV: 79.1 fL (ref 76–111)
MID (cbc): 0.5 (ref 0–0.9)
MPV: 7.4 fL (ref 0–99.8)
POC Granulocyte: 4.5 (ref 2–6.9)
POC LYMPH PERCENT: 32.9 %L (ref 10–50)
POC MID %: 6.2 %M (ref 0–12)
Platelet Count, POC: 222 10*3/uL (ref 142–424)
RBC: 4.87 M/uL (ref 4.04–5.48)
RDW, POC: 16 %
WBC: 7.4 10*3/uL (ref 4.6–10.2)

## 2019-03-03 LAB — POCT GLUCOSE (2 HR PP): Glucose 2 Hr PP, POC: 81 mg/dL

## 2019-03-03 NOTE — Patient Instructions (Signed)
° ° ° °  If you have lab work done today you will be contacted with your lab results within the next 2 weeks.  If you have not heard from us then please contact us. The fastest way to get your results is to register for My Chart. ° ° °IF you received an x-ray today, you will receive an invoice from [facility]. Please contact [facility] at [phone] with questions or concerns regarding your invoice.  ° °IF you received labwork today, you will receive an invoice from [facility]. Please contact [facility] at [phone] with questions or concerns regarding your invoice.  ° °Our billing staff will not be able to assist you with questions regarding bills from these companies. ° °You will be contacted with the lab results as soon as they are available. The fastest way to get your results is to activate your My Chart account. Instructions are located on the last page of this paperwork. If you have not heard from us regarding the results in 2 weeks, please contact this office. °  ° ° ° °

## 2019-03-04 LAB — CMP14+EGFR
ALT: 32 IU/L (ref 0–32)
AST: 33 IU/L (ref 0–40)
Albumin/Globulin Ratio: 1.3 (ref 1.2–2.2)
Albumin: 4 g/dL (ref 3.7–4.7)
Alkaline Phosphatase: 44 IU/L (ref 39–117)
BUN/Creatinine Ratio: 14 (ref 12–28)
BUN: 12 mg/dL (ref 8–27)
Bilirubin Total: <0.2 mg/dL (ref 0.0–1.2)
CO2: 24 mmol/L (ref 20–29)
Calcium: 9.5 mg/dL (ref 8.7–10.3)
Chloride: 101 mmol/L (ref 96–106)
Creatinine, Ser: 0.86 mg/dL (ref 0.57–1.00)
GFR calc Af Amer: 76 mL/min/{1.73_m2} (ref >59)
GFR calc non Af Amer: 66 mL/min/{1.73_m2} (ref >59)
Globulin, Total: 3.1 g/dL (ref 1.5–4.5)
Glucose: 84 mg/dL (ref 65–99)
Potassium: 4.1 mmol/L (ref 3.5–5.2)
Sodium: 138 mmol/L (ref 134–144)
Total Protein: 7.1 g/dL (ref 6.0–8.5)

## 2019-03-04 LAB — VITAMIN D 25 HYDROXY (VIT D DEFICIENCY, FRACTURES): Vit D, 25-Hydroxy: 32.6 ng/mL (ref 30.0–100.0)

## 2019-03-04 LAB — TSH: TSH: 2.21 u[IU]/mL (ref 0.450–4.500)

## 2019-03-04 LAB — VITAMIN B12: Vitamin B-12: 432 pg/mL (ref 232–1245)

## 2019-03-05 ENCOUNTER — Encounter: Payer: Self-pay | Admitting: Registered Nurse

## 2019-03-05 DIAGNOSIS — R5383 Other fatigue: Secondary | ICD-10-CM | POA: Insufficient documentation

## 2019-03-05 LAB — URINE CULTURE

## 2019-03-05 NOTE — Progress Notes (Signed)
Established Patient Office Visit  Subjective:  Patient ID: Cheryl Cooper, female    DOB: 1940/06/11  Age: 79 y.o. MRN: [id number]  CC:  Chief Complaint  Patient presents with  . Fatigue    constanstly tired with no energy. Patient also would like to go over bp    HPI TAE ROBAK presents for fatigue  Onset about 3-4 weeks ago. Worsening. No clear etiology. No changes.  Denies all respiratory, cardiac, neurological, and other symptoms at this time - cannot identify pattern. States that she has been sleeping more and the quality of her sleep has not suffered, but she "could fall asleep right here right now." No changes to medical history, habits, medications, or other.   Past Medical History:  Diagnosis Date  . Anxiety   . Carpal tunnel syndrome   . Deafness in left ear   . Depression   . Diabetes mellitus    diet controlled  . GERD (gastroesophageal reflux disease)   . Hypercholesteremia   . Hypertension   . Hypothyroid   . Memory loss    reports d/t concussion  . PONV (postoperative nausea and vomiting) yrs ago, none recent  . Post concussion syndrome   . Stroke [facility])    reports they were silent    Past Surgical History:  Procedure Laterality Date  . ABDOMINAL HYSTERECTOMY  1986  . BACK SURGERY  2010   lower  . BIOPSY  01/12/2018   Procedure: BIOPSY;  Surgeon: Rush Landmark Telford Nab., MD;  Location: [facility];  Service: Gastroenterology;;  . CHOLECYSTECTOMY    . ESOPHAGOGASTRODUODENOSCOPY (EGD) WITH PROPOFOL N/A 01/12/2018   Procedure: ESOPHAGOGASTRODUODENOSCOPY (EGD) WITH PROPOFOL;  Surgeon: Rush Landmark Telford Nab., MD;  Location: [facility];  Service: Gastroenterology;  Laterality: N/A;  . left elobow surgery  2003  . left rotator cuff  2001  . LUMBAR LAMINECTOMY/DECOMPRESSION MICRODISCECTOMY Left 12/23/2017   Procedure: Laminectomy for facet/synovial cyst - left - Lumbar three-Lumbar four;  Surgeon: Earnie Larsson, MD;  Location: [facility];  Service:  Neurosurgery;  Laterality: Left;  . r foot surgery  1995  . REVERSE SHOULDER ARTHROPLASTY Left 10/23/2018   Procedure: REVERSE TOTAL SHOULDER ARTHROPLASTY;  Surgeon: Netta Cedars, MD;  Location: [facility];  Service: Orthopedics;  Laterality: Left;  interscalene block  . right hand surgery  2001  . right index finger surgery  2009  . SHOULDER OPEN ROTATOR CUFF REPAIR  11/21/2011   Procedure: ROTATOR CUFF REPAIR SHOULDER OPEN;  Surgeon: Johnn Hai, MD;  Location: [facility];  Service: Orthopedics;  Laterality: Right;  with subacromial decompression  . SHOULDER OPEN ROTATOR CUFF REPAIR Right 05/09/2016   Procedure: Right shoulder mini open revision rotator cuff repair, subacromial decompression;  Surgeon: Susa Day, MD;  Location: [facility];  Service: Orthopedics;  Laterality: Right;  . TOTAL HIP ARTHROPLASTY Right 04/24/2017   Procedure: RIGHT TOTAL HIP ARTHROPLASTY ANTERIOR APPROACH;  Surgeon: Rod Can, MD;  Location: [facility];  Service: Orthopedics;  Laterality: Right;  Needs RNFA    Family History  Problem Relation Age of Onset  . Stroke Father   . Stroke Sister   . Stroke Brother   . Diabetes Brother   . Dementia Brother   . Stroke Sister        TIA    Social History   Socioeconomic History  . Marital status: Married    Spouse name: Herschel  . Number of children: 3  . Years of education: 49  . Highest education level:  Not on file  Occupational History    Comment: retired  Tobacco Use  . Smoking status: Never Smoker  . Smokeless tobacco: Never Used  Substance and Sexual Activity  . Alcohol use: Yes    Alcohol/week: 1.0 standard drinks    Types: 1 Glasses of wine per week    Comment: occas  . Drug use: No  . Sexual activity: Not Currently    Comment: 1st intercourse 79 yo-Fewer than 5 partners  Other Topics Concern  . Not on file  Social History Narrative   Married, lives at home with husband   caffeine - 1 Coke daily   Social Determinants of Health   Financial  Resource Strain:   . Difficulty of Paying Living Expenses: Not on file  Food Insecurity:   . Worried About Charity fundraiser in the Last Year: Not on file  . Ran Out of Food in the Last Year: Not on file  Transportation Needs:   . Lack of Transportation (Medical): Not on file  . Lack of Transportation (Non-Medical): Not on file  Physical Activity:   . Days of Exercise per Week: Not on file  . Minutes of Exercise per Session: Not on file  Stress:   . Feeling of Stress : Not on file  Social Connections:   . Frequency of Communication with Friends and Family: Not on file  . Frequency of Social Gatherings with Friends and Family: Not on file  . Attends Religious Services: Not on file  . Active Member of Clubs or Organizations: Not on file  . Attends Archivist Meetings: Not on file  . Marital Status: Not on file  Intimate Partner Violence:   . Fear of Current or Ex-Partner: Not on file  . Emotionally Abused: Not on file  . Physically Abused: Not on file  . Sexually Abused: Not on file    Outpatient Medications Prior to Visit  Medication Sig Dispense Refill  . alendronate (FOSAMAX) 70 MG tablet Take 1 tablet (70 mg total) by mouth every Sunday. Take with a full glass of water on an empty stomach. 12 tablet 3  . amLODipine (NORVASC) 5 MG tablet Take 1 tablet (5 mg total) by mouth daily. 90 tablet 1  . Calcium Carbonate-Vitamin D (CALCIUM-D PO) Take 2 tablets by mouth daily. CHEWABLES    . escitalopram (LEXAPRO) 20 MG tablet Take 20 mg by mouth daily.    [name] esomeprazole (NEXIUM) 40 MG capsule Take 1 capsule (40 mg total) by mouth 2 (two) times daily before a meal. 60 capsule 0  . ezetimibe (ZETIA) 10 MG tablet TAKE ONE TABLET BY MOUTH DAILY 90 tablet 0  . levothyroxine (SYNTHROID) 75 MCG tablet TAKE ONE TABLET BY MOUTH DAILY WITH BREAKFAST 90 tablet 0  . Multiple Vitamins-Minerals (WOMENS MULTI GUMMIES PO) Take 2 tablets by mouth daily.    [name] oxybutynin (DITROPAN XL) 15 MG 24  hr tablet TAKE ONE TABLET BY MOUTH EVERY NIGHT AT BEDTIME 90 tablet 0  . simvastatin (ZOCOR) 20 MG tablet TAKE ONE TABLET BY MOUTH EVERY NIGHT AT BEDTIME 90 tablet 0  . traZODone (DESYREL) 50 MG tablet Take 25 mg by mouth at bedtime.     . methocarbamol (ROBAXIN) 500 MG tablet Take 1 tablet (500 mg total) by mouth 3 (three) times daily as needed. 30 tablet 1  . traMADol (ULTRAM) 50 MG tablet Take 1-2 tablets (50-100 mg total) by mouth every 6 (six) hours as needed for moderate pain or severe  pain. 30 tablet 0   No facility-administered medications prior to visit.    Allergies  Allergen Reactions  . Other Other (See Comments)    Tomato sauce, garlic, onion - severe acid reflux   . Flexeril [Cyclobenzaprine] Other (See Comments)    Per spouse "she felt like a zombie"  . Atorvastatin Other (See Comments)    Unbalanced  . Chlordiazepoxide-Clidinium Other (See Comments)    Dizziness (intolerance)  . Codeine Nausea Only    ROS Review of Systems  Constitutional: Positive for fatigue. Negative for activity change, appetite change, chills, diaphoresis, fever and unexpected weight change.  HENT: Negative.  Negative for congestion, ear discharge, ear pain, sinus pressure, sinus pain, sore throat and trouble swallowing.   Eyes: Negative.  Negative for visual disturbance.  Respiratory: Negative.  Negative for cough, chest tightness and shortness of breath.   Cardiovascular: Negative.  Negative for chest pain, palpitations and leg swelling.  Gastrointestinal: Negative.  Negative for abdominal pain, constipation, diarrhea, nausea and vomiting.  Endocrine: Negative.   Genitourinary: Negative.  Negative for dysuria, frequency, pelvic pain, urgency, vaginal bleeding, vaginal discharge and vaginal pain.  Musculoskeletal: Negative.  Negative for arthralgias and myalgias.  Skin: Negative.   Allergic/Immunologic: Negative.   Neurological: Negative.  Negative for dizziness, weakness and headaches.   Hematological: Negative.   Psychiatric/Behavioral: Negative.  Negative for sleep disturbance. The patient is not nervous/anxious.       Objective:    Physical Exam  Constitutional: She is oriented to person, place, and time. She appears well-developed and well-nourished. No distress.  Cardiovascular: Normal rate and regular rhythm. Exam reveals no gallop and no friction rub.  No murmur heard. Pulmonary/Chest: Effort normal and breath sounds normal. No respiratory distress. She has no wheezes. She has no rales. She exhibits no tenderness.  Neurological: She is alert and oriented to person, place, and time.  Skin: Skin is warm and dry. No rash noted. She is not diaphoretic. No erythema. No pallor.  Psychiatric: She has a normal mood and affect. Her behavior is normal. Judgment and thought content normal.  Nursing note and vitals reviewed.   BP 127/67 (BP Location: Right Arm, Patient Position: Sitting, Cuff Size: Normal)   Pulse 64   Temp 98 F (36.7 C) (Temporal)   Wt 146 lb 12.8 oz (66.6 kg)   SpO2 96%   BMI 25.60 kg/m  Wt Readings from Last 3 Encounters:  03/03/19 146 lb 12.8 oz (66.6 kg)  01/25/19 146 lb 3.2 oz (66.3 kg)  10/23/18 146 lb 1 oz (66.3 kg)     There are no preventive care reminders to display for this patient.  There are no preventive care reminders to display for this patient.  Lab Results  Component Value Date   TSH 2.210 03/03/2019   Lab Results  Component Value Date   WBC 7.4 03/03/2019   HGB 12.4 03/03/2019   HCT 38.5 03/03/2019   MCV 79.1 03/03/2019   PLT 200 10/21/2018   Lab Results  Component Value Date   NA 138 03/03/2019   K 4.1 03/03/2019   CO2 24 03/03/2019   GLUCOSE 84 03/03/2019   BUN 12 03/03/2019   CREATININE 0.86 03/03/2019   BILITOT <0.2 03/03/2019   ALKPHOS 44 03/03/2019   AST 33 03/03/2019   ALT 32 03/03/2019   PROT 7.1 03/03/2019   ALBUMIN 4.0 03/03/2019   CALCIUM 9.5 03/03/2019   ANIONGAP 11 10/24/2018   Lab  Results  Component Value Date  CHOL 172 01/25/2019   Lab Results  Component Value Date   HDL 52 01/25/2019   Lab Results  Component Value Date   LDLCALC 83 01/25/2019   Lab Results  Component Value Date   TRIG 223 (H) 01/25/2019   Lab Results  Component Value Date   CHOLHDL 3.3 01/25/2019   Lab Results  Component Value Date   HGBA1C 6.7 (H) 01/25/2019      Assessment & Plan:   Problem List Items Addressed This Visit    None    Visit Diagnoses    Other fatigue    -  Primary   Relevant Orders   Vitamin B12 (Completed)   Vitamin D, 25-hydroxy (Completed)   TSH (Completed)   CMP14+EGFR (Completed)   POCT Glucose (2 Hr PP) (Completed)   POCT CBC (Completed)   POCT URINALYSIS DIP (CLINITEK) (Completed)   Urine Culture (Completed)      No orders of the defined types were placed in this encounter.   Follow-up: No follow-ups on file.   PLAN  Unclear etiology. Will draw labs and assess. If no clear etiology, she may have to follow up with her PCP, dr Carlota Raspberry  Will CC him on this chart  Otherwise, encouraged patient to monitor symptoms and patterns. Decrease caffeine intake. Reviewed sleep hygiene.   Patient encouraged to call clinic with any questions, comments, or concerns.  Maximiano Coss, NP

## 2019-03-05 NOTE — Progress Notes (Signed)
Results wnl, unfortunately no clear etiology for fatigue Letter sent via MyChart, will CC PCP Dr. Carlota Raspberry as well.  Kathrin Ruddy, NP

## 2019-03-09 DIAGNOSIS — E119 Type 2 diabetes mellitus without complications: Secondary | ICD-10-CM | POA: Diagnosis not present

## 2019-03-09 DIAGNOSIS — I10 Essential (primary) hypertension: Secondary | ICD-10-CM | POA: Diagnosis not present

## 2019-03-09 DIAGNOSIS — E039 Hypothyroidism, unspecified: Secondary | ICD-10-CM | POA: Diagnosis not present

## 2019-03-09 DIAGNOSIS — M8589 Other specified disorders of bone density and structure, multiple sites: Secondary | ICD-10-CM | POA: Diagnosis not present

## 2019-03-19 ENCOUNTER — Encounter: Payer: Self-pay | Admitting: Family Medicine

## 2019-03-23 MED ORDER — TRAZODONE HCL 50 MG PO TABS: 25.0000 mg | ORAL_TABLET | Freq: Every day | ORAL | 1 refills | Status: DC

## 2019-03-30 DIAGNOSIS — M8589 Other specified disorders of bone density and structure, multiple sites: Secondary | ICD-10-CM | POA: Diagnosis not present

## 2019-03-30 DIAGNOSIS — M85852 Other specified disorders of bone density and structure, left thigh: Secondary | ICD-10-CM | POA: Diagnosis not present

## 2019-03-30 DIAGNOSIS — Z780 Asymptomatic menopausal state: Secondary | ICD-10-CM | POA: Diagnosis not present

## 2019-04-14 ENCOUNTER — Telehealth: Payer: Self-pay | Admitting: Family Medicine

## 2019-04-14 NOTE — Telephone Encounter (Signed)
What is the name of the medication? escitalopram (LEXAPRO) 20 MG tablet  oxybutynin (DITROPAN XL) 15 MG 24 hr tablet    Have you contacted your pharmacy to request a refill? Yes, and spoke to Provider about these medications at last [place] would you like this sent to?   Kristopher Oppenheim at [place], [place]  Patient notified that their request is being sent to the clinical staff for review and that they should receive a call once it is complete. If they do not receive a call within 72 hours they can check with their pharmacy or our office.

## 2019-04-15 ENCOUNTER — Other Ambulatory Visit: Payer: Self-pay | Admitting: Family Medicine

## 2019-04-15 ENCOUNTER — Telehealth: Payer: Self-pay | Admitting: Family Medicine

## 2019-04-15 DIAGNOSIS — N3281 Overactive bladder: Secondary | ICD-10-CM

## 2019-04-15 DIAGNOSIS — R350 Frequency of micturition: Secondary | ICD-10-CM

## 2019-04-15 DIAGNOSIS — F329 Major depressive disorder, single episode, unspecified: Secondary | ICD-10-CM

## 2019-04-15 DIAGNOSIS — F32A Depression, unspecified: Secondary | ICD-10-CM

## 2019-04-15 MED ORDER — ESCITALOPRAM OXALATE 20 MG PO TABS: 20.0000 mg | ORAL_TABLET | Freq: Every day | ORAL | 1 refills | Status: DC

## 2019-04-15 NOTE — Telephone Encounter (Signed)
Patient is out of estialopram (lexapro) patient states that they had discussed this on her last visit and is completely out . Would he be willing to fill this for her /. She  had been getting this from Dr. Patriciaann Clan

## 2019-04-15 NOTE — Telephone Encounter (Signed)
Will refill - plan to discuss at next visit. Prior [employer] re: this and trazodone.

## 2019-04-19 DIAGNOSIS — M65332 Trigger finger, left middle finger: Secondary | ICD-10-CM | POA: Diagnosis not present

## 2019-04-19 DIAGNOSIS — M65342 Trigger finger, left ring finger: Secondary | ICD-10-CM | POA: Diagnosis not present

## 2019-04-19 DIAGNOSIS — M65331 Trigger finger, right middle finger: Secondary | ICD-10-CM | POA: Diagnosis not present

## 2019-04-19 DIAGNOSIS — M65341 Trigger finger, right ring finger: Secondary | ICD-10-CM | POA: Diagnosis not present

## 2019-04-19 DIAGNOSIS — M79642 Pain in left hand: Secondary | ICD-10-CM | POA: Diagnosis not present

## 2019-04-19 DIAGNOSIS — M79641 Pain in right hand: Secondary | ICD-10-CM | POA: Diagnosis not present

## 2019-04-20 DIAGNOSIS — Z96641 Presence of right artificial hip joint: Secondary | ICD-10-CM | POA: Diagnosis not present

## 2019-04-20 DIAGNOSIS — M7061 Trochanteric bursitis, right hip: Secondary | ICD-10-CM | POA: Diagnosis not present

## 2019-04-26 ENCOUNTER — Other Ambulatory Visit: Payer: Self-pay | Admitting: Family Medicine

## 2019-04-26 DIAGNOSIS — E039 Hypothyroidism, unspecified: Secondary | ICD-10-CM

## 2019-04-30 ENCOUNTER — Other Ambulatory Visit: Payer: Self-pay | Admitting: Family Medicine

## 2019-04-30 DIAGNOSIS — I10 Essential (primary) hypertension: Secondary | ICD-10-CM

## 2019-05-03 ENCOUNTER — Other Ambulatory Visit: Payer: Self-pay

## 2019-05-03 ENCOUNTER — Encounter: Payer: Self-pay | Admitting: Physical Therapy

## 2019-05-03 ENCOUNTER — Ambulatory Visit: Payer: Medicare PPO | Attending: Orthopedic Surgery | Admitting: Physical Therapy

## 2019-05-03 DIAGNOSIS — R262 Difficulty in walking, not elsewhere classified: Secondary | ICD-10-CM

## 2019-05-03 DIAGNOSIS — M25651 Stiffness of right hip, not elsewhere classified: Secondary | ICD-10-CM | POA: Diagnosis not present

## 2019-05-03 DIAGNOSIS — M25551 Pain in right hip: Secondary | ICD-10-CM | POA: Diagnosis not present

## 2019-05-03 NOTE — Therapy (Signed)
[place], [place], [address] Phone: [phone]   Fax:  [phone]  Physical Therapy Evaluation  Patient Details  Name: Cheryl Cooper MRN: [id number] Date of Birth: [birth year] Referring Provider (PT): Swintek   Encounter Date: 05/03/2019  PT End of Session - 05/03/19 1047    Visit Number  1    Date for PT Re-Evaluation  07/03/19    Authorization Type  [employer]    PT Start Time  1013    PT Stop Time  1055    PT Time Calculation (min)  42 min    Activity Tolerance  Patient tolerated treatment well    Behavior During Therapy  [facility] for tasks assessed/performed       Past Medical History:  Diagnosis Date  . Anxiety   . Carpal tunnel syndrome   . Deafness in left ear   . Depression   . Diabetes mellitus    diet controlled  . GERD (gastroesophageal reflux disease)   . Hypercholesteremia   . Hypertension   . Hypothyroid   . Memory loss    reports d/t concussion  . PONV (postoperative nausea and vomiting) yrs ago, none recent  . Post concussion syndrome   . Stroke [facility])    reports they were silent    Past Surgical History:  Procedure Laterality Date  . ABDOMINAL HYSTERECTOMY  1986  . BACK SURGERY  2010   lower  . BIOPSY  01/12/2018   Procedure: BIOPSY;  Surgeon: Rush Landmark Telford Nab., MD;  Location: [facility];  Service: Gastroenterology;;  . CHOLECYSTECTOMY    . ESOPHAGOGASTRODUODENOSCOPY (EGD) WITH PROPOFOL N/A 01/12/2018   Procedure: ESOPHAGOGASTRODUODENOSCOPY (EGD) WITH PROPOFOL;  Surgeon: Rush Landmark Telford Nab., MD;  [facility];  Service: Gastroenterology;  Laterality: N/A;  . left elobow surgery  2003  . left rotator cuff  2001  . LUMBAR LAMINECTOMY/DECOMPRESSION MICRODISCECTOMY Left 12/23/2017   Procedure: Laminectomy for facet/synovial cyst - left - Lumbar three-Lumbar four;  Surgeon: Earnie Larsson, MD;  Location: [place];  Service: Neurosurgery;  Laterality: Left;   . r foot surgery  1995  . REVERSE SHOULDER ARTHROPLASTY Left 10/23/2018   Procedure: REVERSE TOTAL SHOULDER ARTHROPLASTY;  Surgeon: Netta Cedars, MD;  [facility];  Service: Orthopedics;  Laterality: Left;  interscalene block  . right hand surgery  2001  . right index finger surgery  2009  . SHOULDER OPEN ROTATOR CUFF REPAIR  11/21/2011   Procedure: ROTATOR CUFF REPAIR SHOULDER OPEN;  Surgeon: Johnn Hai, MD;  [facility];  Service: Orthopedics;  Laterality: Right;  with subacromial decompression  . SHOULDER OPEN ROTATOR CUFF REPAIR Right 05/09/2016   Procedure: Right shoulder mini open revision rotator cuff repair, subacromial decompression;  Surgeon: Susa Day, MD;  [facility];  Service: Orthopedics;  Laterality: Right;  . TOTAL HIP ARTHROPLASTY Right 04/24/2017   Procedure: RIGHT TOTAL HIP ARTHROPLASTY ANTERIOR APPROACH;  Surgeon: Rod Can, MD;  [facility];  Service: Orthopedics;  Laterality: Right;  Needs RNFA    There were no vitals filed for this visit.   Subjective Assessment - 05/03/19 1019    Subjective  Patient had a right THA about 2 years ago, she reports that she has been doing well except the past couple of months started having pain, MD feels that it is GT bursitis    Limitations  Walking;House hold activities;Standing    Patient Stated Goals  have less pain  Currently in Pain?  Yes    Pain Score  1     Pain Location  Hip    Pain Orientation  Right;Lateral    Pain Descriptors / Indicators  Aching    Pain Type  Acute pain    Pain Radiating Towards  denies    Pain Onset  More than a month ago    Pain Frequency  Constant    Aggravating Factors   standing, getting up and down, in and out of the car pain up to 8/10    Pain Relieving Factors  rest, Tylenol, pain can be 1/10    Effect of Pain on Daily Activities  just aches, limits walking         [facility] PT Assessment - 05/03/19 0001      Assessment   Medical Diagnosis  right hip GT  bursitis    Referring Provider (PT)  [place]    Onset Date/Surgical Date  04/05/19    Prior Therapy  not for the hip      Home Environment   Additional Comments  has stairs, does some house and yardwork      Prior Function   Level of Independence  Independent    Vocation  Retired    Leisure  recumbent bike 20 minutes      ROM / Strength   AROM / PROM / Strength  AROM;Strength      AROM   Overall AROM Comments  WFL's but with some pain in the GT area      Strength   Overall Strength Comments  right 4-/5 with pain in the right lateral hip      Flexibility   Soft Tissue Assessment /Muscle Length  yes    Hamstrings  tight    Quadriceps  tight    ITB  tight    Piriformis  tight with pain      Palpation   Palpation comment  she is very tender in the right GT area, some tenderness into the buttock  and the back      Ambulation/Gait   Gait Comments  no device, seems to have a limp on the right side                 Objective measurements completed on examination: See above findings.      [facility] Adult PT Treatment/Exercise - 05/03/19 0001      Modalities   Modalities  Iontophoresis      Iontophoresis   Type of Iontophoresis  Dexamethasone    Location  rigtht GT area    Dose  20mA    Time  4 hour patch             PT Education - 05/03/19 1046    Education Details  gentle piriformis stretch    Person(s) Educated  Patient    Methods  Explanation;Demonstration;Tactile cues;Verbal cues;Handout    Comprehension  Verbalized understanding       PT Short Term Goals - 05/03/19 1051      PT SHORT TERM GOAL #1   Title  independent with initial HEP    Time  2    Period  Weeks    Status  New        PT Long Term Goals - 05/03/19 1051      PT LONG TERM GOAL #1   Title  indepednent with advanced HEP    Time  8    Period  Weeks  Status  Achieved      PT LONG TERM GOAL #2   Title  decrease pain 50%    Time  8    Period  Weeks    Status  New       PT LONG TERM GOAL #3   Title  incresae hip strength to 4+/5    Time  8    Period  Weeks    Status  New      PT LONG TERM GOAL #4   Title  report no difficulty getting up from sitting    Time  8    Period  Weeks    Status  New             Plan - 05/03/19 1048    Clinical Impression Statement  Patient reports that she has had right lateral hip pain for over two months, she is unsure of a cause.  X-rays negative, MD dx was GT bursitis.  She has an antalgic gait on the right, she moves slowly, has trunk extension, ROM is WFL's but with pain int he right lateral hip with motions, strength was 4-/5 with pain in the right lateral hip.  Very tight mms around the right hip    Personal Factors and Comorbidities  Comorbidity 3+    Comorbidities  DM, HTN, CVA, right THA, left TSA, depression    Examination-Activity Limitations  Bathing;Bed Mobility;Stairs;Locomotion Level;Stand;Transfers;Sit    Examination-Participation Restrictions  Laundry;Shop;Yard Work    [employer] officer  Evolving/Moderate complexity    Clinical Decision Making  Moderate    Rehab Potential  Good    PT Frequency  2x / week    PT Duration  8 weeks    PT Treatment/Interventions  ADLs/Self Care Home Management;Cryotherapy;Electrical Stimulation;Iontophoresis 4mg /ml Dexamethasone;Ultrasound;Functional mobility training;Stair training;Gait training;Therapeutic activities;Therapeutic exercise;Balance training;Patient/family education;Manual techniques    PT Next Visit Plan  see if ionto helped, start stability and flexibility    Consulted and Agree with Plan of Care  Patient       Patient will benefit from skilled therapeutic intervention in order to improve the following deficits and impairments:  Abnormal gait, Improper body mechanics, Pain, Increased muscle spasms, Decreased mobility, Decreased activity tolerance, Decreased range of motion, Decreased strength, Impaired flexibility, Difficulty  walking  Visit Diagnosis: Pain in right hip - Plan: PT plan of care cert/re-cert  Difficulty in walking, not elsewhere classified - Plan: PT plan of care cert/re-cert  Stiffness of right hip, not elsewhere classified - Plan: PT plan of care cert/re-cert     Problem List Patient Active Problem List   Diagnosis Date Noted  . Other fatigue 03/05/2019  . H/O total shoulder replacement, left 10/23/2018  . PUD (peptic ulcer disease) 03/21/2018  . Nausea and vomiting 01/11/2018  . Synovial cyst of lumbar facet joint 12/23/2017  . Osteoarthritis of right hip 04/24/2017  . Primary osteoarthritis of right hip 04/24/2017  . Complete rotator cuff tear 05/09/2016  . Brainstem stroke ([place]) 01/04/2015  . Dizziness and giddiness 01/04/2015  . Post concussion syndrome 01/04/2015  . Rotator cuff tear, right 11/21/2011    Sumner Boast., PT 05/03/2019, 12:09 PM  [address] [address] [place], [place], [address] Phone: [phone]   Fax:  [phone]  Name: Cheryl Cooper MRN: [id number] Date of Birth: 05-17-1940

## 2019-05-04 ENCOUNTER — Telehealth: Payer: Self-pay | Admitting: Family Medicine

## 2019-05-04 ENCOUNTER — Other Ambulatory Visit: Payer: Self-pay | Admitting: Family Medicine

## 2019-05-04 DIAGNOSIS — I10 Essential (primary) hypertension: Secondary | ICD-10-CM

## 2019-05-04 MED ORDER — AMLODIPINE BESYLATE 5 MG PO TABS: 7.5000 mg | ORAL_TABLET | Freq: Every day | ORAL | 1 refills | Status: DC

## 2019-05-04 NOTE — Telephone Encounter (Signed)
Dr Carlota Raspberry sent over amlodipine 5 mg with new dosing direction to pt pharmacy and pt notified.

## 2019-05-04 NOTE — Telephone Encounter (Signed)
Sent instant message to provider as he is not in office today.  Will contact pt with plan of action when he responds.

## 2019-05-04 NOTE — Telephone Encounter (Signed)
Pt called regarding   amLODipine (NORVASC) 5 MG tablet [id number]  Medication.  Per pt she taking 11/2 pills and she discussed with provider that the proscription was suppose to be upped.  Pt would like a call regarding this [phone]. Please advise.

## 2019-05-04 NOTE — Progress Notes (Signed)
Reordered amlodipine at 7.5 mg total dosing.  New prescription provided.

## 2019-05-10 ENCOUNTER — Other Ambulatory Visit: Payer: Self-pay

## 2019-05-10 ENCOUNTER — Encounter: Payer: Self-pay | Admitting: Physical Therapy

## 2019-05-10 ENCOUNTER — Ambulatory Visit: Payer: Medicare PPO | Admitting: Physical Therapy

## 2019-05-10 DIAGNOSIS — M25551 Pain in right hip: Secondary | ICD-10-CM

## 2019-05-10 DIAGNOSIS — M25651 Stiffness of right hip, not elsewhere classified: Secondary | ICD-10-CM

## 2019-05-10 DIAGNOSIS — R262 Difficulty in walking, not elsewhere classified: Secondary | ICD-10-CM

## 2019-05-10 NOTE — Therapy (Signed)
[place], [place], [address] Phone: [phone]   Fax:  [phone]  Physical Therapy Treatment  Patient Details  Name: Cheryl Cooper MRN: [id number] Date of Birth: 04/15/1940 Referring Provider (PT): Swintek   Encounter Date: 05/10/2019  PT End of Session - 05/10/19 1226    Visit Number  2    Date for PT Re-Evaluation  07/03/19    Authorization Type  [employer]    PT Start Time  [id number]    PT Stop Time  1228    PT Time Calculation (min)  43 min    Activity Tolerance  Patient tolerated treatment well    Behavior During Therapy  [facility] for tasks assessed/performed       Past Medical History:  Diagnosis Date  . Anxiety   . Carpal tunnel syndrome   . Deafness in left ear   . Depression   . Diabetes mellitus    diet controlled  . GERD (gastroesophageal reflux disease)   . Hypercholesteremia   . Hypertension   . Hypothyroid   . Memory loss    reports d/t concussion  . PONV (postoperative nausea and vomiting) yrs ago, none recent  . Post concussion syndrome   . Stroke [facility])    reports they were silent    Past Surgical History:  Procedure Laterality Date  . ABDOMINAL HYSTERECTOMY  1986  . BACK SURGERY  2010   lower  . BIOPSY  01/12/2018   Procedure: BIOPSY;  Surgeon: Rush Landmark Telford Nab., MD;  Location: [facility];  Service: Gastroenterology;;  . CHOLECYSTECTOMY    . ESOPHAGOGASTRODUODENOSCOPY (EGD) WITH PROPOFOL N/A 01/12/2018   Procedure: ESOPHAGOGASTRODUODENOSCOPY (EGD) WITH PROPOFOL;  Surgeon: Rush Landmark Telford Nab., MD;  [facility];  Service: Gastroenterology;  Laterality: N/A;  . left elobow surgery  2003  . left rotator cuff  2001  . LUMBAR LAMINECTOMY/DECOMPRESSION MICRODISCECTOMY Left 12/23/2017   Procedure: Laminectomy for facet/synovial cyst - left - Lumbar three-Lumbar four;  Surgeon: Earnie Larsson, MD;  Location: [facility];  Service: Neurosurgery;  Laterality: Left;   . r foot surgery  1995  . REVERSE SHOULDER ARTHROPLASTY Left 10/23/2018   Procedure: REVERSE TOTAL SHOULDER ARTHROPLASTY;  Surgeon: Netta Cedars, MD;  [facility];  Service: Orthopedics;  Laterality: Left;  interscalene block  . right hand surgery  2001  . right index finger surgery  2009  . SHOULDER OPEN ROTATOR CUFF REPAIR  11/21/2011   Procedure: ROTATOR CUFF REPAIR SHOULDER OPEN;  Surgeon: Johnn Hai, MD;  [facility];  Service: Orthopedics;  Laterality: Right;  with subacromial decompression  . SHOULDER OPEN ROTATOR CUFF REPAIR Right 05/09/2016   Procedure: Right shoulder mini open revision rotator cuff repair, subacromial decompression;  Surgeon: Susa Day, MD;  [facility];  Service: Orthopedics;  Laterality: Right;  . TOTAL HIP ARTHROPLASTY Right 04/24/2017   Procedure: RIGHT TOTAL HIP ARTHROPLASTY ANTERIOR APPROACH;  Surgeon: Rod Can, MD;  [facility];  Service: Orthopedics;  Laterality: Right;  Needs RNFA    There were no vitals filed for this visit.  Subjective Assessment - 05/10/19 1146    Subjective  Doing ok, It is going to take time    Currently in Pain?  Yes    Pain Score  4     Pain Location  Hip    Pain Orientation  Right  [place] Adult PT Treatment/Exercise - 05/10/19 0001      Exercises   Exercises  Knee/Hip      Knee/Hip Exercises: Machines for Strengthening   Cybex Knee Extension  5lb 2x10     Cybex Knee Flexion  20lb 2x10      Knee/Hip Exercises: Standing   Forward Step Up  Both;2 sets;5 reps;Hand Hold: 1      Knee/Hip Exercises: Seated   Ball Squeeze  2x10 3 second hols    Abduction/Adduction   Both;2 sets;15 reps    Abd/Adduction Limitations  Green tband    Sit to [employer]  2 sets;10 reps;5 reps;without UE support   some aching in L knee      Modalities   Modalities  Iontophoresis      Iontophoresis   Type of Iontophoresis  Dexamethasone    Location  rigtht GT area    Dose  44mA     Time  4 hour patch               PT Short Term Goals - 05/03/19 1051      PT SHORT TERM GOAL #1   Title  independent with initial HEP    Time  2    Period  Weeks    Status  New        PT Long Term Goals - 05/03/19 1051      PT LONG TERM GOAL #1   Title  indepednent with advanced HEP    Time  8    Period  Weeks    Status  Achieved      PT LONG TERM GOAL #2   Title  decrease pain 50%    Time  8    Period  Weeks    Status  New      PT LONG TERM GOAL #3   Title  incresae hip strength to 4+/5    Time  8    Period  Weeks    Status  New      PT LONG TERM GOAL #4   Title  report no difficulty getting up from sitting    Time  8    Period  Weeks    Status  New            Plan - 05/10/19 1227    Clinical Impression Statement  Pt tolerated an initial progression to TE well. She report some low back pain that has been going on for about a month. Cues to complete the full ROM with curls and extensions. Antalgic gait on the R side, she also compensated with sit to stand. Positive response to ionto patch    Comorbidities  DM, HTN, CVA, right THA, left TSA, depression    Examination-Activity Limitations  Bathing;Bed Mobility;Stairs;Locomotion Level;Stand;Transfers;Sit    Examination-Participation Restrictions  Laundry;Shop;Yard Work    [employer] officer  Evolving/Moderate complexity    Rehab Potential  Good    PT Frequency  2x / week    PT Duration  8 weeks    PT Treatment/Interventions  ADLs/Self Care Home Management;Cryotherapy;Electrical Stimulation;Iontophoresis 4mg /ml Dexamethasone;Ultrasound;Functional mobility training;Stair training;Gait training;Therapeutic activities;Therapeutic exercise;Balance training;Patient/family education;Manual techniques    PT Next Visit Plan  ionto helped, start stability and flexibility       Patient will benefit from skilled therapeutic intervention in order to improve the following deficits and  impairments:  Abnormal gait, Improper body mechanics, Pain, Increased muscle spasms, Decreased mobility, Decreased activity tolerance, Decreased range of motion, Decreased strength,  Impaired flexibility, Difficulty walking  Visit Diagnosis: Difficulty in walking, not elsewhere classified  Pain in right hip  Stiffness of right hip, not elsewhere classified     Problem List Patient Active Problem List   Diagnosis Date Noted  . Other fatigue 03/05/2019  . H/O total shoulder replacement, left 10/23/2018  . PUD (peptic ulcer disease) 03/21/2018  . Nausea and vomiting 01/11/2018  . Synovial cyst of lumbar facet joint 12/23/2017  . Osteoarthritis of right hip 04/24/2017  . Primary osteoarthritis of right hip 04/24/2017  . Complete rotator cuff tear 05/09/2016  . Brainstem stroke ([place]) 01/04/2015  . Dizziness and giddiness 01/04/2015  . Post concussion syndrome 01/04/2015  . Rotator cuff tear, right 11/21/2011    Scot Jun, PTA 05/10/2019, 12:29 PM  [address] [place], [place], [address] Phone: [phone]   Fax:  [phone]  Name: Cheryl Cooper MRN: [id number] Date of Birth: 05-02-1940

## 2019-05-12 ENCOUNTER — Ambulatory Visit: Payer: Medicare PPO | Admitting: Physical Therapy

## 2019-05-12 ENCOUNTER — Encounter: Payer: Self-pay | Admitting: Physical Therapy

## 2019-05-12 ENCOUNTER — Other Ambulatory Visit: Payer: Self-pay

## 2019-05-12 DIAGNOSIS — R262 Difficulty in walking, not elsewhere classified: Secondary | ICD-10-CM

## 2019-05-12 DIAGNOSIS — M25651 Stiffness of right hip, not elsewhere classified: Secondary | ICD-10-CM

## 2019-05-12 DIAGNOSIS — M25551 Pain in right hip: Secondary | ICD-10-CM

## 2019-05-12 NOTE — Therapy (Signed)
[place], [place], [address] Phone: [phone]   Fax:  [phone]  Physical Therapy Treatment  Patient Details  Name: Cheryl Cooper MRN: [id number] Date of Birth: 1940-11-24 Referring Provider (PT): Swintek   Encounter Date: 05/12/2019  PT End of Session - 05/12/19 1146    Visit Number  3    Date for PT Re-Evaluation  07/03/19    Authorization Type  [employer]    PT Start Time  1100    PT Stop Time  1145    PT Time Calculation (min)  45 min    Activity Tolerance  Patient tolerated treatment well    Behavior During Therapy  [facility] for tasks assessed/performed       Past Medical History:  Diagnosis Date  . Anxiety   . Carpal tunnel syndrome   . Deafness in left ear   . Depression   . Diabetes mellitus    diet controlled  . GERD (gastroesophageal reflux disease)   . Hypercholesteremia   . Hypertension   . Hypothyroid   . Memory loss    reports d/t concussion  . PONV (postoperative nausea and vomiting) yrs ago, none recent  . Post concussion syndrome   . Stroke [facility])    reports they were silent    Past Surgical History:  Procedure Laterality Date  . ABDOMINAL HYSTERECTOMY  1986  . BACK SURGERY  2010   lower  . BIOPSY  01/12/2018   Procedure: BIOPSY;  Surgeon: Rush Landmark Telford Nab., MD;  Location: [facility];  Service: Gastroenterology;;  . CHOLECYSTECTOMY    . ESOPHAGOGASTRODUODENOSCOPY (EGD) WITH PROPOFOL N/A 01/12/2018   Procedure: ESOPHAGOGASTRODUODENOSCOPY (EGD) WITH PROPOFOL;  Surgeon: Rush Landmark Telford Nab., MD;  [facility];  Service: Gastroenterology;  Laterality: N/A;  . left elobow surgery  2003  . left rotator cuff  2001  . LUMBAR LAMINECTOMY/DECOMPRESSION MICRODISCECTOMY Left 12/23/2017   Procedure: Laminectomy for facet/synovial cyst - left - Lumbar three-Lumbar four;  Surgeon: Earnie Larsson, MD;  Location: [place];  Service: Neurosurgery;  Laterality: Left;   . r foot surgery  1995  . REVERSE SHOULDER ARTHROPLASTY Left 10/23/2018   Procedure: REVERSE TOTAL SHOULDER ARTHROPLASTY;  Surgeon: Netta Cedars, MD;  [facility];  Service: Orthopedics;  Laterality: Left;  interscalene block  . right hand surgery  2001  . right index finger surgery  2009  . SHOULDER OPEN ROTATOR CUFF REPAIR  11/21/2011   Procedure: ROTATOR CUFF REPAIR SHOULDER OPEN;  Surgeon: Johnn Hai, MD;  [facility];  Service: Orthopedics;  Laterality: Right;  with subacromial decompression  . SHOULDER OPEN ROTATOR CUFF REPAIR Right 05/09/2016   Procedure: Right shoulder mini open revision rotator cuff repair, subacromial decompression;  Surgeon: Susa Day, MD;  [facility];  Service: Orthopedics;  Laterality: Right;  . TOTAL HIP ARTHROPLASTY Right 04/24/2017   Procedure: RIGHT TOTAL HIP ARTHROPLASTY ANTERIOR APPROACH;  Surgeon: Rod Can, MD;  [facility];  Service: Orthopedics;  Laterality: Right;  Needs RNFA    There were no vitals filed for this visit.  Subjective Assessment - 05/12/19 1101    Subjective  "I am the same" Report some pain in the lateral R hip. Some back pain also reported    Currently in Pain?  Yes    Pain Score  4     Pain Location  Hip    Pain Orientation  Right  [place] Adult PT Treatment/Exercise - 05/12/19 0001      Knee/Hip Exercises: Stretches   Passive Hamstring Stretch  Both;4 reps;10 seconds    ITB Stretch  Both;3 reps;10 seconds    Other Knee/Hip Stretches  Single knee to chest x3 each x15 sec      Knee/Hip Exercises: Aerobic   Recumbent Bike  L0 x 3 min     Nustep  L5 x 6 min       Knee/Hip Exercises: Machines for Strengthening   Cybex Knee Extension  5lb 2x10     Cybex Knee Flexion  20lb 2x10    Cybex Leg Press  20lb 2x10       Knee/Hip Exercises: Supine   Bridges  Strengthening;2 sets;Both;10 reps      Modalities   Modalities  Iontophoresis      Iontophoresis   Type  of Iontophoresis  Dexamethasone    Location  rigtht GT area    Dose  37mA    Time  4 hour patch               PT Short Term Goals - 05/03/19 1051      PT SHORT TERM GOAL #1   Title  independent with initial HEP    Time  2    Period  Weeks    Status  New        PT Long Term Goals - 05/03/19 1051      PT LONG TERM GOAL #1   Title  indepednent with advanced HEP    Time  8    Period  Weeks    Status  Achieved      PT LONG TERM GOAL #2   Title  decrease pain 50%    Time  8    Period  Weeks    Status  New      PT LONG TERM GOAL #3   Title  incresae hip strength to 4+/5    Time  8    Period  Weeks    Status  New      PT LONG TERM GOAL #4   Title  report no difficulty getting up from sitting    Time  8    Period  Weeks    Status  New            Plan - 05/12/19 1147    Clinical Impression Statement  Pt did well with all the exercises today. She reports low back pain that started months ago. Progressed to leg press machine without issues. Some instability with resisted gait. Cues to keep knees bent with resisted side step. R hip tightness noted with passive stretching.    Comorbidities  DM, HTN, CVA, right THA, left TSA, depression    Examination-Activity Limitations  Bathing;Bed Mobility;Stairs;Locomotion Level;Stand;Transfers;Sit    Examination-Participation Restrictions  Laundry;Shop;Yard Work    [employer] officer  Evolving/Moderate complexity    Rehab Potential  Good    PT Frequency  2x / week    PT Duration  8 weeks    PT Treatment/Interventions  ADLs/Self Care Home Management;Cryotherapy;Electrical Stimulation;Iontophoresis 4mg /ml Dexamethasone;Ultrasound;Functional mobility training;Stair training;Gait training;Therapeutic activities;Therapeutic exercise;Balance training;Patient/family education;Manual techniques       Patient will benefit from skilled therapeutic intervention in order to improve the following deficits and  impairments:  Abnormal gait, Improper body mechanics, Pain, Increased muscle spasms, Decreased mobility, Decreased activity tolerance, Decreased range of motion, Decreased strength, Impaired flexibility, Difficulty walking  Visit Diagnosis: Stiffness of right  hip, not elsewhere classified  Pain in right hip  Difficulty in walking, not elsewhere classified     Problem List Patient Active Problem List   Diagnosis Date Noted  . Other fatigue 03/05/2019  . H/O total shoulder replacement, left 10/23/2018  . PUD (peptic ulcer disease) 03/21/2018  . Nausea and vomiting 01/11/2018  . Synovial cyst of lumbar facet joint 12/23/2017  . Osteoarthritis of right hip 04/24/2017  . Primary osteoarthritis of right hip 04/24/2017  . Complete rotator cuff tear 05/09/2016  . Brainstem stroke ([place]) 01/04/2015  . Dizziness and giddiness 01/04/2015  . Post concussion syndrome 01/04/2015  . Rotator cuff tear, right 11/21/2011    Scot Jun, PTA 05/12/2019, 11:51 AM  [address] [place], [place], [address] Phone: [phone]   Fax:  [phone]  Name: ROXANNA VANDERWALKER MRN: [id number] Date of Birth: Jun 25, 1940

## 2019-05-13 DIAGNOSIS — M545 Low back pain: Secondary | ICD-10-CM | POA: Diagnosis not present

## 2019-05-15 ENCOUNTER — Other Ambulatory Visit: Payer: Self-pay | Admitting: Family Medicine

## 2019-05-15 DIAGNOSIS — E785 Hyperlipidemia, unspecified: Secondary | ICD-10-CM

## 2019-05-15 NOTE — Telephone Encounter (Signed)
Requested Prescriptions  Pending Prescriptions Disp Refills  . ezetimibe (ZETIA) 10 MG tablet [Pharmacy Med Name: EZETIMIBE 10 MG TABLET] 90 tablet 2    Sig: TAKE ONE TABLET BY MOUTH DAILY     Cardiovascular:  Antilipid - Sterol Transport Inhibitors Failed - 05/15/2019 10:50 AM      Failed - Triglycerides in normal range and within 360 days    Triglycerides  Date Value Ref Range Status  01/25/2019 223 (H) 0 - 149 mg/dL Final         Passed - Total Cholesterol in normal range and within 360 days    Cholesterol, Total  Date Value Ref Range Status  01/25/2019 172 100 - 199 mg/dL Final         Passed - LDL in normal range and within 360 days    LDL Chol Calc (NIH)  Date Value Ref Range Status  01/25/2019 83 0 - 99 mg/dL Final         Passed - HDL in normal range and within 360 days    HDL  Date Value Ref Range Status  01/25/2019 52 >39 mg/dL Final         Passed - Valid encounter within last 12 months    Recent Outpatient Visits          2 months ago Other fatigue   [facility], Delfino Lovett, NP   3 months ago Essential hypertension   [facility], Ranell Patrick, MD   6 months ago Need for influenza vaccination   [facility], Ranell Patrick, MD   7 months ago Medicare annual wellness visit, subsequent   [facility], Ranell Patrick, MD   7 months ago History of CVA in adulthood   [facility], Ranell Patrick, MD      Future Appointments            In 2 months Carlota Raspberry Ranell Patrick, MD [facility] at [place]

## 2019-05-17 ENCOUNTER — Other Ambulatory Visit: Payer: Self-pay | Admitting: Family Medicine

## 2019-05-17 ENCOUNTER — Ambulatory Visit: Payer: Medicare PPO | Admitting: Physical Therapy

## 2019-05-17 ENCOUNTER — Other Ambulatory Visit: Payer: Self-pay

## 2019-05-17 ENCOUNTER — Encounter: Payer: Self-pay | Admitting: Physical Therapy

## 2019-05-17 DIAGNOSIS — M25651 Stiffness of right hip, not elsewhere classified: Secondary | ICD-10-CM | POA: Diagnosis not present

## 2019-05-17 DIAGNOSIS — M25551 Pain in right hip: Secondary | ICD-10-CM

## 2019-05-17 DIAGNOSIS — E785 Hyperlipidemia, unspecified: Secondary | ICD-10-CM

## 2019-05-17 DIAGNOSIS — R262 Difficulty in walking, not elsewhere classified: Secondary | ICD-10-CM | POA: Diagnosis not present

## 2019-05-17 NOTE — Therapy (Signed)
[address] [place], [place], [address] Phone: [phone]   Fax:  [phone]  Physical Therapy Treatment  Patient Details  Name: Cheryl Cooper MRN: [id number] Date of Birth: [birth year] Referring Provider (PT): [place]   Encounter Date: 05/17/2019  PT End of Session - 05/17/19 1006    Visit Number  4    Date for PT Re-Evaluation  07/03/19    Authorization Type  [employer]    PT Start Time  0930    PT Stop Time  1020    PT Time Calculation (min)  50 min    Activity Tolerance  Patient tolerated treatment well    Behavior During Therapy  [facility] for tasks assessed/performed       Past Medical History:  Diagnosis Date  . Anxiety   . Carpal tunnel syndrome   . Deafness in left ear   . Depression   . Diabetes mellitus    diet controlled  . GERD (gastroesophageal reflux disease)   . Hypercholesteremia   . Hypertension   . Hypothyroid   . Memory loss    reports d/t concussion  . PONV (postoperative nausea and vomiting) yrs ago, none recent  . Post concussion syndrome   . Stroke [facility])    reports they were silent    Past Surgical History:  Procedure Laterality Date  . ABDOMINAL HYSTERECTOMY  1986  . BACK SURGERY  2010   lower  . BIOPSY  01/12/2018   Procedure: BIOPSY;  Surgeon: Rush Landmark Telford Nab., MD;  Location: [facility];  Service: Gastroenterology;;  . CHOLECYSTECTOMY    . ESOPHAGOGASTRODUODENOSCOPY (EGD) WITH PROPOFOL N/A 01/12/2018   Procedure: ESOPHAGOGASTRODUODENOSCOPY (EGD) WITH PROPOFOL;  Surgeon: Rush Landmark Telford Nab., MD;  Location: [facility];  Service: Gastroenterology;  Laterality: N/A;  . left elobow surgery  2003  . left rotator cuff  2001  . LUMBAR LAMINECTOMY/DECOMPRESSION MICRODISCECTOMY Left 12/23/2017   Procedure: Laminectomy for facet/synovial cyst - left - Lumbar three-Lumbar four;  Surgeon: Earnie Larsson, MD;  Location: [place];  Service: Neurosurgery;  Laterality: Left;   . r foot surgery  1995  . REVERSE SHOULDER ARTHROPLASTY Left 10/23/2018   Procedure: REVERSE TOTAL SHOULDER ARTHROPLASTY;  Surgeon: Netta Cedars, MD;  Location: [facility];  Service: Orthopedics;  Laterality: Left;  interscalene block  . right hand surgery  2001  . right index finger surgery  2009  . SHOULDER OPEN ROTATOR CUFF REPAIR  11/21/2011   Procedure: ROTATOR CUFF REPAIR SHOULDER OPEN;  Surgeon: Johnn Hai, MD;  [facility];  Service: Orthopedics;  Laterality: Right;  with subacromial decompression  . SHOULDER OPEN ROTATOR CUFF REPAIR Right 05/09/2016   Procedure: Right shoulder mini open revision rotator cuff repair, subacromial decompression;  Surgeon: Susa Day, MD;  [facility];  Service: Orthopedics;  Laterality: Right;  . TOTAL HIP ARTHROPLASTY Right 04/24/2017   Procedure: RIGHT TOTAL HIP ARTHROPLASTY ANTERIOR APPROACH;  Surgeon: Rod Can, MD;  [facility];  Service: Orthopedics;  Laterality: Right;  Needs RNFA    There were no vitals filed for this visit.  Subjective Assessment - 05/17/19 0935    Subjective  "Fine except for that back"  Pt reports going to the MD Thursday for her back, wants to schedule MRI    Currently in Pain?  Yes    Pain Score  6     Pain Location  Back  [place] [facility] PT Treatment/Exercise - 05/17/19 0001      Knee/Hip Exercises: Aerobic   Recumbent Bike  L0 x 3 min     Nustep  L5 x 6 min       Knee/Hip Exercises: Machines for Strengthening   Cybex Knee Extension  5lb 2x10     Cybex Knee Flexion  20lb 2x10      Knee/Hip Exercises: Standing   Lateral Step Up  Both;2 sets;5 reps;Step Height: 6"    Forward Step Up  Both;2 sets;5 reps;Hand Hold: 1      Knee/Hip Exercises: Supine   Bridges  Strengthening;Both;10 reps;1 set    Other Supine Knee/Hip Exercises  LE on pball small bridges, K2C, oblq       Modalities   Modalities  Moist Heat      Moist Heat Therapy   Number Minutes Moist  Heat  10 Minutes    Moist Heat Location  Lumbar Spine               PT Short Term Goals - 05/17/19 1012      PT SHORT TERM GOAL #1   Title  independent with initial HEP    Status  Achieved        PT Long Term Goals - 05/17/19 1013      PT LONG TERM GOAL #1   Title  indepednent with advanced HEP      PT LONG TERM GOAL #2   Title  decrease pain 50%    Status  Partially Met      PT LONG TERM GOAL #3   Title  incresae hip strength to 4+/5    Status  On-going            Plan - 05/17/19 1007    Clinical Impression Statement  Pt report that her hip is doing better but is having more pain in her back. Went to MD last week for back and will have MRI scheduled in the future. Some hip burning with resisted side step. Little elevation noted with bridges. Some instability noted with step ups.    Comorbidities  DM, HTN, CVA, right THA, left TSA, depression    Examination-Activity Limitations  Bathing;Bed Mobility;Stairs;Locomotion Level;Stand;Transfers;Sit    Examination-Participation Restrictions  Laundry;Shop;Yard Work    Merchant navy officer  Evolving/Moderate complexity    Rehab Potential  Good    PT Frequency  1x / week   Spoke with lead PT abour decresing to 1/week until MRI results.   PT Duration  8 weeks    PT Next Visit Plan  stability and flexibility of hip, cautious of back pain       Patient will benefit from skilled therapeutic intervention in order to improve the following deficits and impairments:  Abnormal gait, Improper body mechanics, Pain, Increased muscle spasms, Decreased mobility, Decreased activity tolerance, Decreased range of motion, Decreased strength, Impaired flexibility, Difficulty walking  Visit Diagnosis: Difficulty in walking, not elsewhere classified  Pain in right hip  Stiffness of right hip, not elsewhere classified     Problem List Patient Active Problem List   Diagnosis Date Noted  . Other fatigue 03/05/2019   . H/O total shoulder replacement, left 10/23/2018  . PUD (peptic ulcer disease) 03/21/2018  . Nausea and vomiting 01/11/2018  . Synovial cyst of lumbar facet joint 12/23/2017  . Osteoarthritis of right hip 04/24/2017  . Primary osteoarthritis of right hip 04/24/2017  . Complete rotator cuff tear 05/09/2016  . Brainstem stroke ([place])  01/04/2015  . Dizziness and giddiness 01/04/2015  . Post concussion syndrome 01/04/2015  . Rotator cuff tear, right 11/21/2011    Scot Jun, PTA 05/17/2019, 10:13 AM  [address] [place], [place], [address] Phone: [phone]   Fax:  [phone]  Name: YUVIA PLANT MRN: [id number] Date of Birth: 01-10-1941

## 2019-05-19 DIAGNOSIS — M545 Low back pain: Secondary | ICD-10-CM | POA: Diagnosis not present

## 2019-05-26 ENCOUNTER — Encounter: Payer: Medicare PPO | Admitting: Physical Therapy

## 2019-05-26 DIAGNOSIS — M545 Low back pain: Secondary | ICD-10-CM | POA: Diagnosis not present

## 2019-05-26 DIAGNOSIS — M4807 Spinal stenosis, lumbosacral region: Secondary | ICD-10-CM | POA: Diagnosis not present

## 2019-05-26 DIAGNOSIS — M5127 Other intervertebral disc displacement, lumbosacral region: Secondary | ICD-10-CM | POA: Diagnosis not present

## 2019-05-26 DIAGNOSIS — M48061 Spinal stenosis, lumbar region without neurogenic claudication: Secondary | ICD-10-CM | POA: Diagnosis not present

## 2019-05-28 ENCOUNTER — Encounter: Payer: Medicare PPO | Admitting: Physical Therapy

## 2019-06-02 ENCOUNTER — Encounter: Payer: Medicare PPO | Admitting: Physical Therapy

## 2019-06-09 ENCOUNTER — Ambulatory Visit: Payer: Medicare PPO | Admitting: Physical Therapy

## 2019-06-21 DIAGNOSIS — M79641 Pain in right hand: Secondary | ICD-10-CM | POA: Diagnosis not present

## 2019-06-21 DIAGNOSIS — M79601 Pain in right arm: Secondary | ICD-10-CM | POA: Diagnosis not present

## 2019-06-21 DIAGNOSIS — M25511 Pain in right shoulder: Secondary | ICD-10-CM | POA: Diagnosis not present

## 2019-06-29 DIAGNOSIS — M48061 Spinal stenosis, lumbar region without neurogenic claudication: Secondary | ICD-10-CM | POA: Diagnosis not present

## 2019-07-03 ENCOUNTER — Other Ambulatory Visit: Payer: Self-pay | Admitting: Family Medicine

## 2019-07-03 DIAGNOSIS — M79631 Pain in right forearm: Secondary | ICD-10-CM | POA: Diagnosis not present

## 2019-07-03 DIAGNOSIS — E039 Hypothyroidism, unspecified: Secondary | ICD-10-CM

## 2019-07-03 NOTE — Telephone Encounter (Signed)
Requested Prescriptions  Pending Prescriptions Disp Refills  . levothyroxine (SYNTHROID) 75 MCG tablet [Pharmacy Med Name: LEVOTHYROXINE 75 MCG TABLET] 90 tablet 2    Sig: TAKE ONE TABLET BY MOUTH DAILY WITH BREAKFAST     Endocrinology:  Hypothyroid Agents Failed - 07/03/2019  9:35 AM      Failed - TSH needs to be rechecked within 3 months after an abnormal result. Refill until TSH is due.      Passed - TSH in normal range and within 360 days    TSH  Date Value Ref Range Status  03/03/2019 2.210 0.450 - 4.500 uIU/mL Final         Passed - Valid encounter within last 12 months    Recent Outpatient Visits          4 months ago Other fatigue   Primary [facility] at [place], NP   5 months ago Essential hypertension   [facility], Ranell Patrick, MD   7 months ago Need for influenza vaccination   [facility], Ranell Patrick, MD   9 months ago Medicare annual wellness visit, subsequent   [facility], Ranell Patrick, MD   9 months ago History of CVA in adulthood   [facility] at [facility], Ranell Patrick, MD      Future Appointments            In 3 weeks Carlota Raspberry Ranell Patrick, MD Primary Care at [place], [facility]

## 2019-07-07 DIAGNOSIS — M48061 Spinal stenosis, lumbar region without neurogenic claudication: Secondary | ICD-10-CM | POA: Diagnosis not present

## 2019-07-07 DIAGNOSIS — M47816 Spondylosis without myelopathy or radiculopathy, lumbar region: Secondary | ICD-10-CM | POA: Diagnosis not present

## 2019-07-08 DIAGNOSIS — Z85828 Personal history of other malignant neoplasm of skin: Secondary | ICD-10-CM | POA: Diagnosis not present

## 2019-07-08 DIAGNOSIS — L239 Allergic contact dermatitis, unspecified cause: Secondary | ICD-10-CM | POA: Diagnosis not present

## 2019-07-08 DIAGNOSIS — L738 Other specified follicular disorders: Secondary | ICD-10-CM | POA: Diagnosis not present

## 2019-07-08 DIAGNOSIS — L821 Other seborrheic keratosis: Secondary | ICD-10-CM | POA: Diagnosis not present

## 2019-07-08 DIAGNOSIS — L918 Other hypertrophic disorders of the skin: Secondary | ICD-10-CM | POA: Diagnosis not present

## 2019-07-08 DIAGNOSIS — L720 Epidermal cyst: Secondary | ICD-10-CM | POA: Diagnosis not present

## 2019-07-12 ENCOUNTER — Other Ambulatory Visit: Payer: Self-pay | Admitting: Family Medicine

## 2019-07-12 DIAGNOSIS — N3281 Overactive bladder: Secondary | ICD-10-CM

## 2019-07-12 DIAGNOSIS — R350 Frequency of micturition: Secondary | ICD-10-CM

## 2019-07-12 NOTE — Telephone Encounter (Signed)
Requested Prescriptions  Pending Prescriptions Disp Refills  . oxybutynin (DITROPAN XL) 15 MG 24 hr tablet [Pharmacy Med Name: OXYBUTYNIN CL ER 15 MG TABLET] 90 tablet 0    Sig: TAKE ONE TABLET BY MOUTH EVERY NIGHT AT BEDTIME     Urology:  Bladder Agents Passed - 07/12/2019  9:56 AM      Passed - Valid encounter within last 12 months    Recent Outpatient Visits          4 months ago Other fatigue   Primary Care at [place], NP   5 months ago Essential hypertension   [facility], Ranell Patrick, MD   7 months ago Need for influenza vaccination   [facility] at [facility], Ranell Patrick, MD   9 months ago Medicare annual wellness visit, subsequent   [facility], Ranell Patrick, MD   9 months ago History of CVA in adulthood   [facility] at [facility], Ranell Patrick, MD      Future Appointments            In 2 weeks Carlota Raspberry Ranell Patrick, MD Primary Care at [place], [facility]

## 2019-07-26 ENCOUNTER — Encounter: Payer: Self-pay | Admitting: Family Medicine

## 2019-07-26 ENCOUNTER — Ambulatory Visit: Payer: Medicare PPO | Admitting: Family Medicine

## 2019-07-26 ENCOUNTER — Other Ambulatory Visit: Payer: Self-pay

## 2019-07-26 ENCOUNTER — Other Ambulatory Visit (HOSPITAL_BASED_OUTPATIENT_CLINIC_OR_DEPARTMENT_OTHER): Payer: Self-pay | Admitting: Obstetrics and Gynecology

## 2019-07-26 VITALS — BP 112/64 | HR 70 | Temp 98.0°F | Ht 63.5 in | Wt 146.0 lb

## 2019-07-26 DIAGNOSIS — E039 Hypothyroidism, unspecified: Secondary | ICD-10-CM | POA: Diagnosis not present

## 2019-07-26 DIAGNOSIS — F329 Major depressive disorder, single episode, unspecified: Secondary | ICD-10-CM | POA: Diagnosis not present

## 2019-07-26 DIAGNOSIS — E785 Hyperlipidemia, unspecified: Secondary | ICD-10-CM

## 2019-07-26 DIAGNOSIS — R350 Frequency of micturition: Secondary | ICD-10-CM | POA: Diagnosis not present

## 2019-07-26 DIAGNOSIS — N3281 Overactive bladder: Secondary | ICD-10-CM

## 2019-07-26 DIAGNOSIS — E119 Type 2 diabetes mellitus without complications: Secondary | ICD-10-CM

## 2019-07-26 DIAGNOSIS — F32A Depression, unspecified: Secondary | ICD-10-CM

## 2019-07-26 DIAGNOSIS — I10 Essential (primary) hypertension: Secondary | ICD-10-CM

## 2019-07-26 DIAGNOSIS — Z1231 Encounter for screening mammogram for malignant neoplasm of breast: Secondary | ICD-10-CM

## 2019-07-26 MED ORDER — OXYBUTYNIN CHLORIDE ER 15 MG PO TB24: 15.0000 mg | ORAL_TABLET | Freq: Every day | ORAL | 2 refills | Status: DC

## 2019-07-26 MED ORDER — ESCITALOPRAM OXALATE 20 MG PO TABS: 20.0000 mg | ORAL_TABLET | Freq: Every day | ORAL | 1 refills | Status: DC

## 2019-07-26 MED ORDER — SIMVASTATIN 20 MG PO TABS: 20.0000 mg | ORAL_TABLET | Freq: Every day | ORAL | 2 refills | Status: DC

## 2019-07-26 MED ORDER — EZETIMIBE 10 MG PO TABS: 10.0000 mg | ORAL_TABLET | Freq: Every day | ORAL | 2 refills | Status: DC

## 2019-07-26 MED ORDER — LEVOTHYROXINE SODIUM 75 MCG PO TABS: 75.0000 ug | ORAL_TABLET | Freq: Every day | ORAL | 2 refills | Status: DC

## 2019-07-26 MED ORDER — TRAZODONE HCL 50 MG PO TABS: 25.0000 mg | ORAL_TABLET | Freq: Every day | ORAL | 1 refills | Status: DC

## 2019-07-26 NOTE — Progress Notes (Signed)
Subjective:  Patient ID: Cheryl Cooper, female    DOB: 1940-10-01  Age: 79 y.o. MRN: [id number]  CC:  Chief Complaint  Patient presents with  . Follow-up    on diabetes,hypothyroidism,hypertension, and hyperlipideima. pt reports no issues with her diabetes. pt states she is diet controled and she checks her BS once a day. pt reports no issues with her hypertension since last visit. pt dosen't check her BP at home pt hasn't expressed any phyiscal symptoms. pt reports having no issues with any of her medication and has not had any side effects.    HPI Cheryl Cooper presents for   Followed by Dr. Annette Stable, neurosurgery for back pain. Had injection. On mobic.   Hypertension: Remote history of brainstem CVA.  Takes amlodipine 7.5 mg daily. On ASA most days.  Home readings:none. No new side effects.  BP Readings from Last 3 Encounters:  07/26/19 112/64  03/03/19 127/67  01/25/19 140/68   Lab Results  Component Value Date   CREATININE 0.96 07/26/2019   Hypothyroidism: Lab Results  Component Value Date   TSH 2.210 03/03/2019   Taking medication daily.  Synthroid 75 mcg daily No new hot or cold intolerance. No new hair or skin changes, heart palpitations or new fatigue. No new weight changes.   Overactive bladder Takes Ditropan XL 15 mg daily, working well previously. Still working well, some thirst, but no dizziness.   Hyperlipidemia: Secondary prevention with previous brainstem CVA.  LDL goal below 70.  Currently on simvastatin 20 mg daily, Zetia 10 mg daily. No new myalgias or side effects with meds. Lab Results  Component Value Date   CHOL 178 07/26/2019   HDL 46 07/26/2019   LDLCALC 84 07/26/2019   TRIG 293 (H) 07/26/2019   CHOLHDL 3.9 07/26/2019   Lab Results  Component Value Date   ALT 44 (H) 07/26/2019   AST 45 (H) 07/26/2019   ALKPHOS 48 07/26/2019   BILITOT <0.2 07/26/2019    Depression: Lexapro, trazodone working well without new side effects. Aware of GI  risk with mobic combo and med combos. No new side effects.   Depression screen [facility] 2/9 07/26/2019 03/03/2019 01/25/2019 10/05/2018 09/28/2018  Decreased Interest 0 0 0 0 0  Down, Depressed, Hopeless 0 0 0 0 0  PHQ - 2 Score 0 0 0 0 0     Diabetes: Diet controlled.  With previous CVA. Diet controlled previously.  No current medications.  She is on statin.  Microalbumin: Normal ratio 07/21/2018 Still followed by endocrinology.   Lab Results  Component Value Date   HGBA1C 6.5 (H) 07/26/2019   HGBA1C 6.7 (H) 01/25/2019   HGBA1C 6.6 (H) 10/21/2018   Lab Results  Component Value Date   LDLCALC 84 07/26/2019   CREATININE 0.96 07/26/2019     History Patient Active Problem List   Diagnosis Date Noted  . Other fatigue 03/05/2019  . H/O total shoulder replacement, left 10/23/2018  . PUD (peptic ulcer disease) 03/21/2018  . Nausea and vomiting 01/11/2018  . Synovial cyst of lumbar facet joint 12/23/2017  . Osteoarthritis of right hip 04/24/2017  . Primary osteoarthritis of right hip 04/24/2017  . Complete rotator cuff tear 05/09/2016  . Brainstem stroke ([place]) 01/04/2015  . Dizziness and giddiness 01/04/2015  . Post concussion syndrome 01/04/2015  . Rotator cuff tear, right 11/21/2011   Past Medical History:  Diagnosis Date  . Anxiety   . Carpal tunnel syndrome   . Deafness in left ear   .  Depression   . Diabetes mellitus    diet controlled  . GERD (gastroesophageal reflux disease)   . Hypercholesteremia   . Hypertension   . Hypothyroid   . Memory loss    reports d/t concussion  . PONV (postoperative nausea and vomiting) yrs ago, none recent  . Post concussion syndrome   . Stroke [facility])    reports they were silent   Past Surgical History:  Procedure Laterality Date  . ABDOMINAL HYSTERECTOMY  1986  . BACK SURGERY  2010   lower  . BIOPSY  01/12/2018   Procedure: BIOPSY;  Surgeon: Rush Landmark Telford Nab., MD;  Location: [facility];  Service: Gastroenterology;;  .  CHOLECYSTECTOMY    . ESOPHAGOGASTRODUODENOSCOPY (EGD) WITH PROPOFOL N/A 01/12/2018   Procedure: ESOPHAGOGASTRODUODENOSCOPY (EGD) WITH PROPOFOL;  Surgeon: Rush Landmark Telford Nab., MD;  [facility];  Service: Gastroenterology;  Laterality: N/A;  . left elobow surgery  2003  . left rotator cuff  2001  . LUMBAR LAMINECTOMY/DECOMPRESSION MICRODISCECTOMY Left 12/23/2017   Procedure: Laminectomy for facet/synovial cyst - left - Lumbar three-Lumbar four;  Surgeon: Earnie Larsson, MD;  Location: [place];  Service: Neurosurgery;  Laterality: Left;  . r foot surgery  1995  . REVERSE SHOULDER ARTHROPLASTY Left 10/23/2018   Procedure: REVERSE TOTAL SHOULDER ARTHROPLASTY;  Surgeon: Netta Cedars, MD;  Location: [facility];  Service: Orthopedics;  Laterality: Left;  interscalene block  . right hand surgery  2001  . right index finger surgery  2009  . SHOULDER OPEN ROTATOR CUFF REPAIR  11/21/2011   Procedure: ROTATOR CUFF REPAIR SHOULDER OPEN;  Surgeon: Johnn Hai, MD;  [facility];  Service: Orthopedics;  Laterality: Right;  with subacromial decompression  . SHOULDER OPEN ROTATOR CUFF REPAIR Right 05/09/2016   Procedure: Right shoulder mini open revision rotator cuff repair, subacromial decompression;  Surgeon: Susa Day, MD;  [facility];  Service: Orthopedics;  Laterality: Right;  . TOTAL HIP ARTHROPLASTY Right 04/24/2017   Procedure: RIGHT TOTAL HIP ARTHROPLASTY ANTERIOR APPROACH;  Surgeon: Rod Can, MD;  [facility];  Service: Orthopedics;  Laterality: Right;  Needs RNFA   Allergies  Allergen Reactions  . Other Other (See Comments)    Tomato sauce, garlic, onion - severe acid reflux   . Flexeril [Cyclobenzaprine] Other (See Comments)    Per spouse "she felt like a zombie"  . Atorvastatin Other (See Comments)    Unbalanced  . Chlordiazepoxide-Clidinium Other (See Comments)    Dizziness (intolerance)  . Codeine Nausea Only   Prior to Admission medications   Medication Sig  Start Date End Date Taking? Authorizing Provider  alendronate (FOSAMAX) 70 MG tablet Take 1 tablet (70 mg total) by mouth every Sunday. Take with a full glass of water on an empty stomach. 12/27/18  Yes Wendie Agreste, MD  amLODipine (NORVASC) 5 MG tablet Take 1.5 tablets (7.5 mg total) by mouth daily. 05/04/19  Yes Wendie Agreste, MD  Calcium Carbonate-Vitamin D (CALCIUM-D PO) Take 2 tablets by mouth daily. CHEWABLES   Yes [provider]  escitalopram (LEXAPRO) 20 MG tablet Take 1 tablet (20 mg total) by mouth daily. 04/15/19  Yes Wendie Agreste, MD  esomeprazole (NEXIUM) 40 MG capsule Take 1 capsule (40 mg total) by mouth 2 (two) times daily before a meal. 01/13/18  Yes Mikhail, [place], DO  ezetimibe (ZETIA) 10 MG tablet TAKE ONE TABLET BY MOUTH DAILY 05/15/19  Yes Wendie Agreste, MD  levothyroxine (SYNTHROID) 75 MCG tablet TAKE ONE TABLET BY  MOUTH DAILY WITH BREAKFAST 07/03/19  Yes Wendie Agreste, MD  meloxicam [facility]) 15 MG tablet  07/07/19  Yes [provider]  Multiple Vitamins-Minerals (WOMENS MULTI GUMMIES PO) Take 2 tablets by mouth daily.   Yes [provider]  oxybutynin (DITROPAN XL) 15 MG 24 hr tablet TAKE ONE TABLET BY MOUTH EVERY NIGHT AT BEDTIME 07/12/19  Yes Wendie Agreste, MD  simvastatin (ZOCOR) 20 MG tablet TAKE ONE TABLET BY MOUTH EVERY NIGHT AT BEDTIME 05/17/19  Yes Wendie Agreste, MD  traZODone (DESYREL) 50 MG tablet Take 0.5 tablets (25 mg total) by mouth at bedtime. 03/23/19  Yes Wendie Agreste, MD   Social History   Socioeconomic History  . Marital status: Married    Spouse name: Herschel  . Number of children: 3  . Years of education: 15  . Highest education level: Not on file  Occupational History    Comment: retired  Tobacco Use  . Smoking status: Never Smoker  . Smokeless tobacco: Never Used  Substance and Sexual Activity  . Alcohol use: Yes    Alcohol/week: 1.0 standard drinks    Types: 1 Glasses of wine per week     Comment: occas  . Drug use: No  . Sexual activity: Not Currently    Comment: 1st intercourse 79 yo-Fewer than 5 partners  Other Topics Concern  . Not on file  Social History Narrative   Married, lives at home with husband   caffeine - 1 Coke daily   Social Determinants of Health   Financial Resource Strain:   . Difficulty of Paying Living Expenses:   Food Insecurity:   . Worried About Charity fundraiser in the Last Year:   . Arboriculturist in the Last Year:   Transportation Needs:   . Film/video editor (Medical):   [name] Lack of Transportation (Non-Medical):   Physical Activity:   . Days of Exercise per Week:   . Minutes of Exercise per Session:   Stress:   . Feeling of Stress :   Social Connections:   . Frequency of Communication with Friends and Family:   . Frequency of Social Gatherings with Friends and Family:   . Attends Religious Services:   . Active Member of Clubs or Organizations:   . Attends Archivist Meetings:   [name] Marital Status:   Intimate Partner Violence:   . Fear of Current or Ex-Partner:   . Emotionally Abused:   [name] Physically Abused:   . Sexually Abused:     Review of Systems  Constitutional: Negative for fatigue and unexpected weight change.  Respiratory: Negative for chest tightness and shortness of breath.   Cardiovascular: Negative for chest pain, palpitations and leg swelling.  Gastrointestinal: Negative for abdominal pain and blood in stool.  Neurological: Negative for dizziness, syncope, light-headedness and headaches (rare, no changes. ).     Objective:   Vitals:   07/26/19 0947  BP: 112/64  Pulse: 70  Temp: 98 F (36.7 C)  TempSrc: Temporal  SpO2: 95%  Weight: 146 lb (66.2 kg)  Height: 5' 3.5" (1.613 m)     Physical Exam Vitals reviewed.  Constitutional:      Appearance: She is well-developed.  HENT:     Head: Normocephalic and atraumatic.  Eyes:     Conjunctiva/sclera: Conjunctivae normal.     Pupils:  Pupils are equal, round, and reactive to light.  Neck:     Vascular: No carotid bruit.  Cardiovascular:  Rate and Rhythm: Normal rate and regular rhythm.     Heart sounds: Normal heart sounds.  Pulmonary:     Effort: Pulmonary effort is normal.     Breath sounds: Normal breath sounds.  Abdominal:     Palpations: Abdomen is soft. There is no pulsatile mass.     Tenderness: There is no abdominal tenderness.  Skin:    General: Skin is warm and dry.  Neurological:     Mental Status: She is alert and oriented to person, place, and time.  Psychiatric:        Behavior: Behavior normal.        Assessment & Plan:  Cheryl Cooper is a 79 y.o. female . Type 2 diabetes, diet controlled ([place])  -Stable by A1c.  Continue diet control.  Hypothyroidism, unspecified type - Plan: Microalbumin / creatinine urine ratio, Comprehensive metabolic panel, Lipid panel, Hemoglobin A1c, levothyroxine (SYNTHROID) 75 MCG tablet  -  Stable, tolerating current regimen. Medications refilled. Labs pending as above.   Essential hypertension  - The current medical regimen is effective;  continue present plan and medications.  Overactive bladder - Plan: oxybutynin (DITROPAN XL) 15 MG 24 hr tablet Urinary frequency - Plan: oxybutynin (DITROPAN XL) 15 MG 24 hr tablet  -Stable without new side effects, continue ditropan  Hyperlipidemia, unspecified hyperlipidemia type - Plan: ezetimibe (ZETIA) 10 MG tablet, simvastatin (ZOCOR) 20 MG tablet  -Tolerating statin and Zetia, check labs.  Depression, unspecified depression type - Plan: escitalopram (LEXAPRO) 20 MG tablet, traZODone (DESYREL) 50 MG tablet  -Stable with trazodone and Lexapro.  Meds ordered this encounter  Medications  . ezetimibe (ZETIA) 10 MG tablet    Sig: Take 1 tablet (10 mg total) by mouth daily.    Dispense:  90 tablet    Refill:  2  . levothyroxine (SYNTHROID) 75 MCG tablet    Sig: Take 1 tablet (75 mcg total) by mouth daily with  breakfast.    Dispense:  90 tablet    Refill:  2  . oxybutynin (DITROPAN XL) 15 MG 24 hr tablet    Sig: Take 1 tablet (15 mg total) by mouth at bedtime.    Dispense:  90 tablet    Refill:  2  . simvastatin (ZOCOR) 20 MG tablet    Sig: Take 1 tablet (20 mg total) by mouth at bedtime.    Dispense:  90 tablet    Refill:  2  . escitalopram (LEXAPRO) 20 MG tablet    Sig: Take 1 tablet (20 mg total) by mouth daily.    Dispense:  90 tablet    Refill:  1  . traZODone (DESYREL) 50 MG tablet    Sig: Take 0.5 tablets (25 mg total) by mouth at bedtime.    Dispense:  45 tablet    Refill:  1   Patient Instructions       If you have lab work done today you will be contacted with your lab results within the next 2 weeks.  If you have not heard from [place] then please contact us. The fastest way to get your results is to register for My Chart.   IF you received an x-ray today, you will receive an invoice from [facility]. Please contact [facility] at [phone] with questions or concerns regarding your invoice.   IF you received labwork today, you will receive an invoice from [facility]. Please contact [facility] at [phone] with questions or concerns regarding your invoice.   Our billing staff  will not be able to assist you with questions regarding bills from these companies.  You will be contacted with the lab results as soon as they are available. The fastest way to get your results is to activate your My Chart account. Instructions are located on the last page of this paperwork. If you have not heard from [place] regarding the results in 2 weeks, please contact this office.         Signed, Merri Ray, MD [facility]

## 2019-07-26 NOTE — Patient Instructions (Signed)
° ° ° °  If you have lab work done today you will be contacted with your lab results within the next 2 weeks.  If you have not heard from us then please contact us. The fastest way to get your results is to register for My Chart. ° ° °IF you received an x-ray today, you will receive an invoice from [facility]. Please contact [facility] at [phone] with questions or concerns regarding your invoice.  ° °IF you received labwork today, you will receive an invoice from [facility]. Please contact [facility] at [phone] with questions or concerns regarding your invoice.  ° °Our billing staff will not be able to assist you with questions regarding bills from these companies. ° °You will be contacted with the lab results as soon as they are available. The fastest way to get your results is to activate your My Chart account. Instructions are located on the last page of this paperwork. If you have not heard from us regarding the results in 2 weeks, please contact this office. °  ° ° ° °

## 2019-07-27 ENCOUNTER — Encounter: Payer: Self-pay | Admitting: Family Medicine

## 2019-07-27 LAB — COMPREHENSIVE METABOLIC PANEL
ALT: 44 IU/L — ABNORMAL HIGH (ref 0–32)
AST: 45 IU/L — ABNORMAL HIGH (ref 0–40)
Albumin/Globulin Ratio: 1.5 (ref 1.2–2.2)
Albumin: 4.4 g/dL (ref 3.7–4.7)
Alkaline Phosphatase: 48 IU/L (ref 48–121)
BUN/Creatinine Ratio: 18 (ref 12–28)
BUN: 17 mg/dL (ref 8–27)
Bilirubin Total: <0.2 mg/dL (ref 0.0–1.2)
CO2: 23 mmol/L (ref 20–29)
Calcium: 9.6 mg/dL (ref 8.7–10.3)
Chloride: 104 mmol/L (ref 96–106)
Creatinine, Ser: 0.96 mg/dL (ref 0.57–1.00)
GFR calc Af Amer: 66 mL/min/{1.73_m2} (ref >59)
GFR calc non Af Amer: 57 mL/min/{1.73_m2} — ABNORMAL LOW (ref >59)
Globulin, Total: 3 g/dL (ref 1.5–4.5)
Glucose: 105 mg/dL — ABNORMAL HIGH (ref 65–99)
Potassium: 4.4 mmol/L (ref 3.5–5.2)
Sodium: 141 mmol/L (ref 134–144)
Total Protein: 7.4 g/dL (ref 6.0–8.5)

## 2019-07-27 LAB — LIPID PANEL
Chol/HDL Ratio: 3.9 ratio (ref 0.0–4.4)
Cholesterol, Total: 178 mg/dL (ref 100–199)
HDL: 46 mg/dL (ref >39)
LDL Chol Calc (NIH): 84 mg/dL (ref 0–99)
Triglycerides: 293 mg/dL — ABNORMAL HIGH (ref 0–149)
VLDL Cholesterol Cal: 48 mg/dL — ABNORMAL HIGH (ref 5–40)

## 2019-07-27 LAB — HEMOGLOBIN A1C
Est. average glucose Bld gHb Est-mCnc: 140 mg/dL
Hgb A1c MFr Bld: 6.5 % — ABNORMAL HIGH (ref 4.8–5.6)

## 2019-07-27 LAB — MICROALBUMIN / CREATININE URINE RATIO
Creatinine, Urine: 242.7 mg/dL
Microalb/Creat Ratio: 10 mg/g creat (ref 0–29)
Microalbumin, Urine: 24 ug/mL

## 2019-08-03 DIAGNOSIS — M47816 Spondylosis without myelopathy or radiculopathy, lumbar region: Secondary | ICD-10-CM | POA: Diagnosis not present

## 2019-08-06 DIAGNOSIS — M8589 Other specified disorders of bone density and structure, multiple sites: Secondary | ICD-10-CM | POA: Diagnosis not present

## 2019-08-06 DIAGNOSIS — E119 Type 2 diabetes mellitus without complications: Secondary | ICD-10-CM | POA: Diagnosis not present

## 2019-08-06 DIAGNOSIS — E039 Hypothyroidism, unspecified: Secondary | ICD-10-CM | POA: Diagnosis not present

## 2019-08-06 DIAGNOSIS — I10 Essential (primary) hypertension: Secondary | ICD-10-CM | POA: Diagnosis not present

## 2019-08-12 ENCOUNTER — Other Ambulatory Visit: Payer: Self-pay

## 2019-08-12 ENCOUNTER — Ambulatory Visit (HOSPITAL_BASED_OUTPATIENT_CLINIC_OR_DEPARTMENT_OTHER)
Admission: RE | Admit: 2019-08-12 | Discharge: 2019-08-12 | Disposition: A | Discharge status: Home / Self Care | Payer: Medicare PPO | Source: Ambulatory Visit | Attending: Obstetrics and Gynecology | Admitting: Obstetrics and Gynecology

## 2019-08-12 DIAGNOSIS — Z1231 Encounter for screening mammogram for malignant neoplasm of breast: Secondary | ICD-10-CM | POA: Insufficient documentation

## 2019-08-13 ENCOUNTER — Ambulatory Visit: Payer: Medicare PPO | Admitting: Obstetrics and Gynecology

## 2019-08-13 ENCOUNTER — Encounter: Payer: Self-pay | Admitting: Obstetrics and Gynecology

## 2019-08-13 ENCOUNTER — Encounter: Payer: Medicare Other | Admitting: Gynecology

## 2019-08-13 VITALS — BP 120/74 | Ht 63.0 in | Wt 145.0 lb

## 2019-08-13 DIAGNOSIS — Z01419 Encounter for gynecological examination (general) (routine) without abnormal findings: Secondary | ICD-10-CM | POA: Diagnosis not present

## 2019-08-13 DIAGNOSIS — M8580 Other specified disorders of bone density and structure, unspecified site: Secondary | ICD-10-CM

## 2019-08-13 NOTE — Progress Notes (Signed)
Cheryl Cooper [birth year] [id number]  SUBJECTIVE:  79 y.o. [id number] female here for an annual routine breast and pelvic exam. She has no gynecologic concerns.  Current Outpatient Medications  Medication Sig Dispense Refill  . alendronate (FOSAMAX) 70 MG tablet Take 1 tablet (70 mg total) by mouth every Sunday. Take with a full glass of water on an empty stomach. 12 tablet 3  . amLODipine (NORVASC) 5 MG tablet Take 1.5 tablets (7.5 mg total) by mouth daily. 135 tablet 1  . Calcium Carbonate-Vitamin D (CALCIUM-D PO) Take 2 tablets by mouth daily. CHEWABLES    . escitalopram (LEXAPRO) 20 MG tablet Take 1 tablet (20 mg total) by mouth daily. 90 tablet 1  . esomeprazole (NEXIUM) 40 MG capsule Take 1 capsule (40 mg total) by mouth 2 (two) times daily before a meal. 60 capsule 0  . ezetimibe (ZETIA) 10 MG tablet Take 1 tablet (10 mg total) by mouth daily. 90 tablet 2  . levothyroxine (SYNTHROID) 75 MCG tablet Take 1 tablet (75 mcg total) by mouth daily with breakfast. 90 tablet 2  . meloxicam (MOBIC) 15 MG tablet     . Multiple Vitamins-Minerals (WOMENS MULTI GUMMIES PO) Take 2 tablets by mouth daily.    [name] oxybutynin (DITROPAN XL) 15 MG 24 hr tablet Take 1 tablet (15 mg total) by mouth at bedtime. 90 tablet 2  . simvastatin (ZOCOR) 20 MG tablet Take 1 tablet (20 mg total) by mouth at bedtime. 90 tablet 2  . traZODone (DESYREL) 50 MG tablet Take 0.5 tablets (25 mg total) by mouth at bedtime. 45 tablet 1   No current facility-administered medications for this visit.   Allergies: Other, Flexeril [cyclobenzaprine], Atorvastatin, Chlordiazepoxide-clidinium, and Codeine  No LMP recorded. Patient has had a hysterectomy.  Past medical history,surgical history, problem list, medications, allergies, family history and social history were all reviewed and documented as reviewed in the EPIC chart.  ROS:  Feeling well. No dyspnea or chest pain on exertion.  No abdominal pain, change in bowel habits, black  or bloody stools.  No urinary tract symptoms. GYN ROS: no abnormal bleeding, pelvic pain or discharge, no breast pain or new or enlarging lumps on self exam. No neurological complaints.   OBJECTIVE:  Ht 5\' 3"  (1.6 m)   Wt 145 lb (65.8 kg)   BMI 25.69 kg/m  The patient appears well, alert, oriented x 3, in no distress. ENT normal.  Neck supple. No cervical or supraclavicular adenopathy or thyromegaly.  Lungs are clear, good air entry, no wheezes, rhonchi or rales. S1 and S2 normal, no murmurs, regular rate and rhythm.  Abdomen soft without tenderness, guarding, mass or organomegaly.  Neurological is normal, no focal findings.  BREAST EXAM: breasts appear normal, no suspicious masses, no skin or nipple changes or axillary nodes  PELVIC EXAM: VULVA: normal appearing vulva with no masses, tenderness or lesions, atrophic changes, VAGINA: normal appearing vagina with normal color and discharge, no lesions, CERVIX: surgically absent, UTERUS: surgically absent, vaginal cuff normal, ADNEXA: no masses, non tender Chaperone: Caryn Bee present during the examination  ASSESSMENT:  79 y.o. G4W1027 here for annual gynecologic exam  PLAN:   1. Postmenopausal. Prior hysterectomy for heavy bleeding due to benign causes.  She retains ovaries.  No significant menopausal symptoms. 2. No significant history of abnormal Pap smears.  Comfortable not screening based on current guidelines. 3. Mammogram just yesterday, report yet to be scanned in.  Normal breast exam today.  Continuing with annual mammograms. 4.  Colonoscopy 2018.  Recommended that she follow up at the recommended interval.   5. Osteopenia.  DEXA 2021 reported.  We do not have the results in our records.  She continues on alendronate through her primary physician and follows up with them in regards to bone health.   6. Health maintenance.  No labs today as she normally has these completed elsewhere.  Return annually or sooner, prn.  Joseph Pierini MD 08/13/19

## 2019-09-01 DIAGNOSIS — M47816 Spondylosis without myelopathy or radiculopathy, lumbar region: Secondary | ICD-10-CM | POA: Diagnosis not present

## 2019-09-20 ENCOUNTER — Other Ambulatory Visit: Payer: Self-pay

## 2019-09-20 ENCOUNTER — Ambulatory Visit: Payer: Medicare PPO | Attending: Neurosurgery | Admitting: Physical Therapy

## 2019-09-20 ENCOUNTER — Encounter: Payer: Self-pay | Admitting: Physical Therapy

## 2019-09-20 DIAGNOSIS — M545 Low back pain, unspecified: Secondary | ICD-10-CM

## 2019-09-20 DIAGNOSIS — M62830 Muscle spasm of back: Secondary | ICD-10-CM | POA: Diagnosis not present

## 2019-09-20 DIAGNOSIS — R262 Difficulty in walking, not elsewhere classified: Secondary | ICD-10-CM | POA: Diagnosis not present

## 2019-09-20 NOTE — Therapy (Signed)
[place] [address] [place], [place], [address] Phone: [phone]   Fax:  [phone]  Physical Therapy Evaluation  Patient Details  Name: Cheryl Cooper MRN: [id number] Date of Birth: [birth year] Referring Provider (PT): Pool   Encounter Date: 09/20/2019   PT End of Session - 09/20/19 1346    Visit Number 1    Date for PT Re-Evaluation 11/20/19    Authorization Type [employer]    PT Start Time 1310    PT Stop Time 1400    PT Time Calculation (min) 50 min    Activity Tolerance Patient tolerated treatment well    Behavior During Therapy [facility] for tasks assessed/performed           Past Medical History:  Diagnosis Date  . Anxiety   . Carpal tunnel syndrome   . Deafness in left ear   . Depression   . Diabetes mellitus    diet controlled  . GERD (gastroesophageal reflux disease)   . Hypercholesteremia   . Hypertension   . Hypothyroid   . Memory loss    reports d/t concussion  . PONV (postoperative nausea and vomiting) yrs ago, none recent  . Post concussion syndrome   . Stroke [facility])    reports they were silent    Past Surgical History:  Procedure Laterality Date  . ABDOMINAL HYSTERECTOMY  1986  . BACK SURGERY  2010   lower  . BIOPSY  01/12/2018   Procedure: BIOPSY;  Surgeon: Rush Landmark Telford Nab., MD;  Location: [facility];  Service: Gastroenterology;;  . CHOLECYSTECTOMY    . ESOPHAGOGASTRODUODENOSCOPY (EGD) WITH PROPOFOL N/A 01/12/2018   Procedure: ESOPHAGOGASTRODUODENOSCOPY (EGD) WITH PROPOFOL;  Surgeon: Rush Landmark Telford Nab., MD;  Location: [facility];  Service: Gastroenterology;  Laterality: N/A;  . left elobow surgery  2003  . left rotator cuff  2001  . LUMBAR LAMINECTOMY/DECOMPRESSION MICRODISCECTOMY Left 12/23/2017   Procedure: Laminectomy for facet/synovial cyst - left - Lumbar three-Lumbar four;  Surgeon: Earnie Larsson, MD;  Location: [facility] [place] [facility];  Service: Neurosurgery;  Laterality: Left;  .  r foot surgery  1995  . REVERSE SHOULDER ARTHROPLASTY Left 10/23/2018   Procedure: REVERSE TOTAL SHOULDER ARTHROPLASTY;  Surgeon: Netta Cedars, MD;  Location: [facility];  Service: Orthopedics;  Laterality: Left;  interscalene block  . right hand surgery  2001  . right index finger surgery  2009  . SHOULDER OPEN ROTATOR CUFF REPAIR  11/21/2011   Procedure: ROTATOR CUFF REPAIR SHOULDER OPEN;  Surgeon: Johnn Hai, MD;  Location: [facility];  Service: Orthopedics;  Laterality: Right;  with subacromial decompression  . SHOULDER OPEN ROTATOR CUFF REPAIR Right 05/09/2016   Procedure: Right shoulder mini open revision rotator cuff repair, subacromial decompression;  Surgeon: Susa Day, MD;  Location: [facility];  Service: Orthopedics;  Laterality: Right;  . TOTAL HIP ARTHROPLASTY Right 04/24/2017   Procedure: RIGHT TOTAL HIP ARTHROPLASTY ANTERIOR APPROACH;  Surgeon: Rod Can, MD;  Location[facility];  Service: Orthopedics;  Laterality: Right;  Needs RNFA    There were no vitals filed for this visit.    Subjective Assessment - 09/20/19 1318    Subjective Patient reports that she has had some LBP since January, she is unsure of a cause.  She reports that she has had two different epidural steroid injections.  She reports that these have not helped.  Had laminectomy in 2019.    Limitations Sitting;Lifting;Standing;Walking    Patient Stated Goals have less pain  Currently in Pain? Yes    Pain Score 8     Pain Location Back    Pain Orientation Lower;Left    Pain Descriptors / Indicators Aching;Constant    Pain Type Acute pain    Pain Radiating Towards denies    Pain Onset More than a month ago    Pain Frequency Constant    Aggravating Factors  sitting, making the beds, vacuuming, standing pain will go up to 9/10    Pain Relieving Factors in a recliner sometimes it will be better, pain a 4/10 at best    Effect of Pain on Daily Activities difficulty walking, sitting and all ADL's               [facility] PT Assessment - 09/20/19 0001      Assessment   Medical Diagnosis LBP    Referring Provider (PT) Pool    Onset Date/Surgical Date 08/20/19    Prior Therapy for the back and the hip      Home Environment   Additional Comments has stairs, does some house and yardwork      Prior Function   Level of Independence Independent    Vocation Retired    Leisure no exercise      Posture/Postural Control   Posture Comments decreased lordosis      AROM   Overall AROM Comments lumbar ROM decreased  50%       Strength   Overall Strength Comments 4/5 for the LE's      Flexibility   Soft Tissue Assessment /Muscle Length yes    Hamstrings tight with some back pain    Quadriceps tight    ITB tight    Piriformis tight with some back pain      Palpation   Palpation comment very tender in the bilateral SI area, tender into the buttock, she is very tight in the lumbar paraspinals      Ambulation/Gait   Gait Comments has a slight antlagic gait on the left                      Objective measurements completed on examination: See above findings.       [facility] Adult PT Treatment/Exercise - 09/20/19 0001      Modalities   Modalities Electrical Stimulation      Moist Heat Therapy   Number Minutes Moist Heat 10 Minutes    Moist Heat Location Lumbar Spine      Electrical Stimulation   Electrical Stimulation Location lumbar spine    Electrical Stimulation Action IFC    Electrical Stimulation Parameters supine    Electrical Stimulation Goals Pain                  PT Education - 09/20/19 1345    Education Details gave HEP for Wms flexion    Person(s) Educated Patient    Methods Explanation;Demonstration;Tactile cues;Verbal cues;Handout    Comprehension Verbalized understanding            PT Short Term Goals - 09/20/19 1435      PT SHORT TERM GOAL #1   Title independent with initial HEP    Time 2    Period Weeks    Status New             PT Long  Term Goals - 09/20/19 1435      PT LONG TERM GOAL #1   Title indepednent with advanced HEP    Time 8  Period Weeks    Status New      PT LONG TERM GOAL #2   Title decrease pain 50%    Time 8    Period Weeks    Status New      PT LONG TERM GOAL #3   Title incresae hip strength to 4+/5    Time 8    Period Weeks    Status New      PT LONG TERM GOAL #4   Title report no difficulty getting up from sitting    Time 8    Period Weeks    Status New      PT LONG TERM GOAL #5   Title increase lumbar ROM 25%    Time 8    Status New                  Plan - 09/20/19 1346    Clinical Impression Statement Patient has a history of Lumbar laminectomy in 2019.  She reports that she has been doing well, reports that about January/ February she started having LBP, she is unsure of a cause.  She has some DDD that showed up in past x-rays.  She has tightness in the LE's.  She is very tight in the lumbar mms, she is tender in the bilateral SI area.  REports difficulty sitting, standing, and other ADL's    Personal Factors and Comorbidities Comorbidity 3+    Comorbidities DM, HTN, CVA, right THA, left TSA, depression    Examination-Activity Limitations Bathing;Bed Mobility;Stairs;Locomotion Level;Stand;Transfers;Sit;Carry;Lift    Examination-Participation Restrictions Laundry;Shop;Yard Work;Cleaning    Stability/Clinical Decision Making Evolving/Moderate complexity    Clinical Decision Making Moderate    Rehab Potential Good    PT Frequency 2x / week    PT Duration 6 weeks    PT Treatment/Interventions ADLs/Self Care Home Management;Cryotherapy;Electrical Stimulation;Ultrasound;Functional mobility training;Stair training;Gait training;Therapeutic activities;Therapeutic exercise;Balance training;Patient/family education;Manual techniques;Dry needling    PT Next Visit Plan work on flexibility and stability    Consulted and Agree with Plan of Care Patient           Patient will  benefit from skilled therapeutic intervention in order to improve the following deficits and impairments:  Abnormal gait, Improper body mechanics, Pain, Increased muscle spasms, Decreased mobility, Decreased activity tolerance, Decreased range of motion, Decreased strength, Impaired flexibility, Difficulty walking, Postural dysfunction  Visit Diagnosis: Acute bilateral low back pain without sciatica - Plan: PT plan of care cert/re-cert  Difficulty in walking, not elsewhere classified - Plan: PT plan of care cert/re-cert  Muscle spasm of back - Plan: PT plan of care cert/re-cert     Problem List Patient Active Problem List   Diagnosis Date Noted  . Other fatigue 03/05/2019  . H/O total shoulder replacement, left 10/23/2018  . PUD (peptic ulcer disease) 03/21/2018  . Nausea and vomiting 01/11/2018  . Synovial cyst of lumbar facet joint 12/23/2017  . Osteoarthritis of right hip 04/24/2017  . Primary osteoarthritis of right hip 04/24/2017  . Complete rotator cuff tear 05/09/2016  . Brainstem stroke ([place]) 01/04/2015  . Dizziness and giddiness 01/04/2015  . Post concussion syndrome 01/04/2015  . Rotator cuff tear, right 11/21/2011    Sumner Boast., PT 09/20/2019, 2:38 PM  [place] [address] [place], [place], [address] Phone: [phone]   Fax:  [phone]  Name: Cheryl Cooper MRN: [id number] Date of Birth: 03-01-1940

## 2019-09-29 ENCOUNTER — Encounter: Payer: Self-pay | Admitting: Physical Therapy

## 2019-09-29 ENCOUNTER — Other Ambulatory Visit: Payer: Self-pay

## 2019-09-29 ENCOUNTER — Ambulatory Visit: Payer: Medicare PPO | Admitting: Physical Therapy

## 2019-09-29 DIAGNOSIS — R262 Difficulty in walking, not elsewhere classified: Secondary | ICD-10-CM | POA: Diagnosis not present

## 2019-09-29 DIAGNOSIS — M62830 Muscle spasm of back: Secondary | ICD-10-CM

## 2019-09-29 DIAGNOSIS — M545 Low back pain, unspecified: Secondary | ICD-10-CM

## 2019-09-29 NOTE — Therapy (Signed)
[place] [address] [place], [place], [address] Phone: [phone]   Fax:  [phone]  Physical Therapy Treatment  Patient Details  Name: Cheryl Cooper MRN: [id number] Date of Birth: December 09, 1940 Referring Provider (PT): Pool   Encounter Date: 09/29/2019   PT End of Session - 09/29/19 1351    Visit Number 2    Date for PT Re-Evaluation 11/20/19    Authorization Type [employer]    PT Start Time 1310    PT Stop Time 1407    PT Time Calculation (min) 57 min    Activity Tolerance Patient tolerated treatment well    Behavior During Therapy [facility] for tasks assessed/performed           Past Medical History:  Diagnosis Date  . Anxiety   . Carpal tunnel syndrome   . Deafness in left ear   . Depression   . Diabetes mellitus    diet controlled  . GERD (gastroesophageal reflux disease)   . Hypercholesteremia   . Hypertension   . Hypothyroid   . Memory loss    reports d/t concussion  . PONV (postoperative nausea and vomiting) yrs ago, none recent  . Post concussion syndrome   . Stroke [facility])    reports they were silent    Past Surgical History:  Procedure Laterality Date  . ABDOMINAL HYSTERECTOMY  1986  . BACK SURGERY  2010   lower  . BIOPSY  01/12/2018   Procedure: BIOPSY;  Surgeon: Rush Landmark Telford Nab., MD;  Location: [facility];  Service: Gastroenterology;;  . CHOLECYSTECTOMY    . ESOPHAGOGASTRODUODENOSCOPY (EGD) WITH PROPOFOL N/A 01/12/2018   Procedure: ESOPHAGOGASTRODUODENOSCOPY (EGD) WITH PROPOFOL;  Surgeon: Rush Landmark Telford Nab., MD;  [facility];  Service: Gastroenterology;  Laterality: N/A;  . left elobow surgery  2003  . left rotator cuff  2001  . LUMBAR LAMINECTOMY/DECOMPRESSION MICRODISCECTOMY Left 12/23/2017   Procedure: Laminectomy for facet/synovial cyst - left - Lumbar three-Lumbar four;  Surgeon: Earnie Larsson, MD;  Location: [place];  Service: Neurosurgery;  Laterality: Left;  .  r foot surgery  1995  . REVERSE SHOULDER ARTHROPLASTY Left 10/23/2018   Procedure: REVERSE TOTAL SHOULDER ARTHROPLASTY;  Surgeon: Netta Cedars, MD;  [facility];  Service: Orthopedics;  Laterality: Left;  interscalene block  . right hand surgery  2001  . right index finger surgery  2009  . SHOULDER OPEN ROTATOR CUFF REPAIR  11/21/2011   Procedure: ROTATOR CUFF REPAIR SHOULDER OPEN;  Surgeon: Johnn Hai, MD;  Location: [facility];  Service: Orthopedics;  Laterality: Right;  with subacromial decompression  . SHOULDER OPEN ROTATOR CUFF REPAIR Right 05/09/2016   Procedure: Right shoulder mini open revision rotator cuff repair, subacromial decompression;  Surgeon: Susa Day, MD;  [facility];  Service: Orthopedics;  Laterality: Right;  . TOTAL HIP ARTHROPLASTY Right 04/24/2017   Procedure: RIGHT TOTAL HIP ARTHROPLASTY ANTERIOR APPROACH;  Surgeon: Rod Can, MD;  [facility];  Service: Orthopedics;  Laterality: Right;  Needs RNFA    There were no vitals filed for this visit.   Subjective Assessment - 09/29/19 1314    Subjective Patient reports that her back feels tight    Currently in Pain? Yes    Pain Score 6     Pain Location Back    Pain Orientation Lower    Pain Descriptors / Indicators Aching;Tightness  [facility] Adult PT Treatment/Exercise - 09/29/19 0001      Exercises   Exercises Lumbar      Lumbar Exercises: Stretches   Passive Hamstring Stretch Right;Left;4 reps;20 seconds    Single Knee to Chest Stretch Right;Left;4 reps;10 seconds    Piriformis Stretch Right;Left;4 reps;20 seconds      Lumbar Exercises: Seated   Other Seated Lumbar Exercises on sit fit pelvic mobility and stability with pelvic clocks, marches and kicks, tband scap stab all while on the sit fit for core activation      Lumbar Exercises: Supine   Other Supine Lumbar Exercises feet on ball K2C, trunk rotation, small bridge activation, isometric  abs      Knee/Hip Exercises: Aerobic   Nustep level 5 x 6 minutes      Modalities   Modalities Electrical Stimulation      Moist Heat Therapy   Number Minutes Moist Heat 10 Minutes    Moist Heat Location Lumbar Spine      Electrical Stimulation   Electrical Stimulation Location lumbar spine    Electrical Stimulation Action --   FC   Electrical Stimulation Parameters supine    Electrical Stimulation Goals Pain                  PT Education - 09/29/19 1351    Education Details reviewed HEP    Person(s) Educated Patient    Methods Demonstration;Explanation    Comprehension Verbalized understanding;Returned demonstration            PT Short Term Goals - 09/29/19 1353      PT SHORT TERM GOAL #1   Title independent with initial HEP    Status Partially Met             PT Long Term Goals - 09/20/19 1435      PT LONG TERM GOAL #1   Title indepednent with advanced HEP    Time 8    Period Weeks    Status New      PT LONG TERM GOAL #2   Title decrease pain 50%    Time 8    Period Weeks    Status New      PT LONG TERM GOAL #3   Title incresae hip strength to 4+/5    Time 8    Period Weeks    Status New      PT LONG TERM GOAL #4   Title report no difficulty getting up from sitting    Time 8    Period Weeks    Status New      PT LONG TERM GOAL #5   Title increase lumbar ROM 25%    Time 8    Status New                 Plan - 09/29/19 1352    Clinical Impression Statement Patient able to tolerate the exercises she did report some increased pain, has difficulty with stability.  Needed a lot of cues to do HEP correctly    PT Next Visit Plan work on flexibility and stability    Consulted and Agree with Plan of Care Patient           Patient will benefit from skilled therapeutic intervention in order to improve the following deficits and impairments:  Abnormal gait, Improper body mechanics, Pain, Increased muscle spasms, Decreased mobility,  Decreased activity tolerance, Decreased range of motion, Decreased strength, Impaired flexibility, Difficulty walking, Postural dysfunction  Visit Diagnosis: Acute bilateral low back pain without sciatica  Difficulty in walking, not elsewhere classified  Muscle spasm of back     Problem List Patient Active Problem List   Diagnosis Date Noted  . Other fatigue 03/05/2019  . H/O total shoulder replacement, left 10/23/2018  . PUD (peptic ulcer disease) 03/21/2018  . Nausea and vomiting 01/11/2018  . Synovial cyst of lumbar facet joint 12/23/2017  . Osteoarthritis of right hip 04/24/2017  . Primary osteoarthritis of right hip 04/24/2017  . Complete rotator cuff tear 05/09/2016  . Brainstem stroke ([place]) 01/04/2015  . Dizziness and giddiness 01/04/2015  . Post concussion syndrome 01/04/2015  . Rotator cuff tear, right 11/21/2011    Sumner Boast., PT 09/29/2019, 2:02 PM  [place] [place], [place], [address] Phone: [phone]   Fax:  [phone]  Name: Cheryl Cooper MRN: [id number] Date of Birth: 12-23-1940

## 2019-10-01 ENCOUNTER — Encounter: Payer: Medicare PPO | Admitting: Physical Therapy

## 2019-10-01 ENCOUNTER — Encounter: Payer: Self-pay | Admitting: Physical Therapy

## 2019-10-01 ENCOUNTER — Ambulatory Visit: Payer: Medicare PPO | Admitting: Physical Therapy

## 2019-10-01 ENCOUNTER — Other Ambulatory Visit: Payer: Self-pay

## 2019-10-01 DIAGNOSIS — M62830 Muscle spasm of back: Secondary | ICD-10-CM

## 2019-10-01 DIAGNOSIS — M545 Low back pain, unspecified: Secondary | ICD-10-CM

## 2019-10-01 DIAGNOSIS — R262 Difficulty in walking, not elsewhere classified: Secondary | ICD-10-CM

## 2019-10-01 NOTE — Therapy (Signed)
[place], [place], [address] Phone: [phone]   Fax:  [phone]  Physical Therapy Treatment  Patient Details  Name: Cheryl Cooper MRN: [id number] Date of Birth: Jul 03, 1940 Referring Provider (PT): Pool   Encounter Date: 10/01/2019   PT End of Session - 10/01/19 1010    Visit Number 3    Date for PT Re-Evaluation 11/05/19    Authorization Type [employer]    PT Start Time 0923    PT Stop Time 1022    PT Time Calculation (min) 59 min    Activity Tolerance Patient tolerated treatment well    Behavior During Therapy [facility] for tasks assessed/performed           Past Medical History:  Diagnosis Date  . Anxiety   . Carpal tunnel syndrome   . Deafness in left ear   . Depression   . Diabetes mellitus    diet controlled  . GERD (gastroesophageal reflux disease)   . Hypercholesteremia   . Hypertension   . Hypothyroid   . Memory loss    reports d/t concussion  . PONV (postoperative nausea and vomiting) yrs ago, none recent  . Post concussion syndrome   . Stroke [facility])    reports they were silent    Past Surgical History:  Procedure Laterality Date  . ABDOMINAL HYSTERECTOMY  1986  . BACK SURGERY  2010   lower  . BIOPSY  01/12/2018   Procedure: BIOPSY;  Surgeon: Rush Landmark Telford Nab., MD;  Location: [facility];  Service: Gastroenterology;;  . CHOLECYSTECTOMY    . ESOPHAGOGASTRODUODENOSCOPY (EGD) WITH PROPOFOL N/A 01/12/2018   Procedure: ESOPHAGOGASTRODUODENOSCOPY (EGD) WITH PROPOFOL;  Surgeon: Rush Landmark Telford Nab., MD;  [facility];  Service: Gastroenterology;  Laterality: N/A;  . left elobow surgery  2003  . left rotator cuff  2001  . LUMBAR LAMINECTOMY/DECOMPRESSION MICRODISCECTOMY Left 12/23/2017   Procedure: Laminectomy for facet/synovial cyst - left - Lumbar three-Lumbar four;  Surgeon: Earnie Larsson, MD;  Location: [facility];  Service: Neurosurgery;  Laterality: Left;  .  r foot surgery  1995  . REVERSE SHOULDER ARTHROPLASTY Left 10/23/2018   Procedure: REVERSE TOTAL SHOULDER ARTHROPLASTY;  Surgeon: Netta Cedars, MD;  [facility];  Service: Orthopedics;  Laterality: Left;  interscalene block  . right hand surgery  2001  . right index finger surgery  2009  . SHOULDER OPEN ROTATOR CUFF REPAIR  11/21/2011   Procedure: ROTATOR CUFF REPAIR SHOULDER OPEN;  Surgeon: Johnn Hai, MD;  [facility];  Service: Orthopedics;  Laterality: Right;  with subacromial decompression  . SHOULDER OPEN ROTATOR CUFF REPAIR Right 05/09/2016   Procedure: Right shoulder mini open revision rotator cuff repair, subacromial decompression;  Surgeon: Susa Day, MD;  [facility];  Service: Orthopedics;  Laterality: Right;  . TOTAL HIP ARTHROPLASTY Right 04/24/2017   Procedure: RIGHT TOTAL HIP ARTHROPLASTY ANTERIOR APPROACH;  Surgeon: Rod Can, MD;  [facility];  Service: Orthopedics;  Laterality: Right;  Needs RNFA    There were no vitals filed for this visit.                      [place] Adult PT Treatment/Exercise - 10/01/19 0001      Lumbar Exercises: Stretches   Passive Hamstring Stretch Right;Left;4 reps;20 seconds    Single Knee to Chest Stretch Right;Left;4 reps;10 seconds    Piriformis Stretch Right;Left;4 reps;20 seconds      Lumbar  Exercises: Aerobic   Nustep level 5 x 6 minutes      Lumbar Exercises: Standing   Other Standing Lumbar Exercises 2.5# hip abduction and extension      Lumbar Exercises: Seated   Other Seated Lumbar Exercises on sit fit pelvic mobility and stability with pelvic clocks, marches and kicks, tband scap stab all while on the sit fit for core activation      Lumbar Exercises: Supine   Bridge 20 reps;1 second    Other Supine Lumbar Exercises feet on ball K2C, trunk rotation, small bridge activation, isometric abs      Moist Heat Therapy   Number Minutes Moist Heat 10 Minutes    Moist Heat Location Lumbar  Spine      Electrical Stimulation   Electrical Stimulation Location IFC    Electrical Stimulation Action supine    Electrical Stimulation Parameters pa                    PT Short Term Goals - 10/01/19 1013      PT SHORT TERM GOAL #1   Title independent with initial HEP    Status Achieved             PT Long Term Goals - 10/01/19 1013      PT LONG TERM GOAL #1   Title indepednent with advanced HEP    Status On-going      PT LONG TERM GOAL #2   Title decrease pain 50%    Status On-going      PT LONG TERM GOAL #3   Title incresae hip strength to 4+/5    Status On-going      PT LONG TERM GOAL #4   Title report no difficulty getting up from sitting    Status On-going      PT LONG TERM GOAL #5   Title increase lumbar ROM 25%    Status On-going                 Plan - 10/01/19 1012    Clinical Impression Statement Patient reports that she is feeling better, less pain.  She is very tight in the hips, has difficulty with the exercises requiring cues especially for core activation, she had some pain wiht hip extension, and difficulty with the pelvic motions    PT Next Visit Plan work on flexibility and stability    Consulted and Agree with Plan of Care Patient           Patient will benefit from skilled therapeutic intervention in order to improve the following deficits and impairments:  Abnormal gait, Improper body mechanics, Pain, Increased muscle spasms, Decreased mobility, Decreased activity tolerance, Decreased range of motion, Decreased strength, Impaired flexibility, Difficulty walking, Postural dysfunction  Visit Diagnosis: Acute bilateral low back pain without sciatica  Difficulty in walking, not elsewhere classified  Muscle spasm of back     Problem List Patient Active Problem List   Diagnosis Date Noted  . Other fatigue 03/05/2019  . H/O total shoulder replacement, left 10/23/2018  . PUD (peptic ulcer disease) 03/21/2018  .  Nausea and vomiting 01/11/2018  . Synovial cyst of lumbar facet joint 12/23/2017  . Osteoarthritis of right hip 04/24/2017  . Primary osteoarthritis of right hip 04/24/2017  . Complete rotator cuff tear 05/09/2016  . Brainstem stroke ([place]) 01/04/2015  . Dizziness and giddiness 01/04/2015  . Post concussion syndrome 01/04/2015  . Rotator cuff tear, right 11/21/2011    Purl Claytor W.,  PT 10/01/2019, 10:15 AM  [place], [place], [address] Phone: [phone]   Fax:  [phone]  Name: CHRISLYNN MOSELY MRN: [id number] Date of Birth: May 17, 1940

## 2019-10-04 ENCOUNTER — Ambulatory Visit: Payer: Medicare PPO | Admitting: Physical Therapy

## 2019-10-04 ENCOUNTER — Encounter: Payer: Self-pay | Admitting: Physical Therapy

## 2019-10-04 ENCOUNTER — Other Ambulatory Visit: Payer: Self-pay

## 2019-10-04 DIAGNOSIS — M545 Low back pain, unspecified: Secondary | ICD-10-CM

## 2019-10-04 DIAGNOSIS — R262 Difficulty in walking, not elsewhere classified: Secondary | ICD-10-CM | POA: Diagnosis not present

## 2019-10-04 DIAGNOSIS — M62830 Muscle spasm of back: Secondary | ICD-10-CM

## 2019-10-04 NOTE — Therapy (Signed)
[place] [address] [place], [place], [address] Phone: [phone]   Fax:  [phone]  Physical Therapy Treatment  Patient Details  Name: Cheryl Cooper MRN: [id number] Date of Birth: 03/20/1940 Referring Provider (PT): Pool   Encounter Date: 10/04/2019   PT End of Session - 10/04/19 1007    Visit Number 4    Date for PT Re-Evaluation 11/05/19    Authorization Type [employer]    PT Start Time 0930    PT Stop Time 1020    PT Time Calculation (min) 50 min           Past Medical History:  Diagnosis Date  . Anxiety   . Carpal tunnel syndrome   . Deafness in left ear   . Depression   . Diabetes mellitus    diet controlled  . GERD (gastroesophageal reflux disease)   . Hypercholesteremia   . Hypertension   . Hypothyroid   . Memory loss    reports d/t concussion  . PONV (postoperative nausea and vomiting) yrs ago, none recent  . Post concussion syndrome   . Stroke [facility])    reports they were silent    Past Surgical History:  Procedure Laterality Date  . ABDOMINAL HYSTERECTOMY  1986  . BACK SURGERY  2010   lower  . BIOPSY  01/12/2018   Procedure: BIOPSY;  Surgeon: Rush Landmark Telford Nab., MD;  Location: [facility];  Service: Gastroenterology;;  . CHOLECYSTECTOMY    . ESOPHAGOGASTRODUODENOSCOPY (EGD) WITH PROPOFOL N/A 01/12/2018   Procedure: ESOPHAGOGASTRODUODENOSCOPY (EGD) WITH PROPOFOL;  Surgeon: Rush Landmark Telford Nab., MD;  [facility];  Service: Gastroenterology;  Laterality: N/A;  . left elobow surgery  2003  . left rotator cuff  2001  . LUMBAR LAMINECTOMY/DECOMPRESSION MICRODISCECTOMY Left 12/23/2017   Procedure: Laminectomy for facet/synovial cyst - left - Lumbar three-Lumbar four;  Surgeon: Earnie Larsson, MD;  Location: [facility];  Service: Neurosurgery;  Laterality: Left;  . r foot surgery  1995  . REVERSE SHOULDER ARTHROPLASTY Left 10/23/2018   Procedure: REVERSE TOTAL SHOULDER  ARTHROPLASTY;  Surgeon: Netta Cedars, MD;  [facility];  Service: Orthopedics;  Laterality: Left;  interscalene block  . right hand surgery  2001  . right index finger surgery  2009  . SHOULDER OPEN ROTATOR CUFF REPAIR  11/21/2011   Procedure: ROTATOR CUFF REPAIR SHOULDER OPEN;  Surgeon: Johnn Hai, MD;  [facility];  Service: Orthopedics;  Laterality: Right;  with subacromial decompression  . SHOULDER OPEN ROTATOR CUFF REPAIR Right 05/09/2016   Procedure: Right shoulder mini open revision rotator cuff repair, subacromial decompression;  Surgeon: Susa Day, MD;  [facility];  Service: Orthopedics;  Laterality: Right;  . TOTAL HIP ARTHROPLASTY Right 04/24/2017   Procedure: RIGHT TOTAL HIP ARTHROPLASTY ANTERIOR APPROACH;  Surgeon: Rod Can, MD;  [facility];  Service: Orthopedics;  Laterality: Right;  Needs RNFA    There were no vitals filed for this visit.   Subjective Assessment - 10/04/19 0929    Subjective Feeling a little better today    Currently in Pain? Yes    Pain Score 5     Pain Location Back                             [facility] Adult PT Treatment/Exercise - 10/04/19 0001      Lumbar Exercises: Stretches   Passive Hamstring Stretch Right;Left;4 reps;20 seconds  Single Knee to Chest Stretch Right;Left;4 reps;10 seconds    Piriformis Stretch Right;Left;4 reps;20 seconds      Lumbar Exercises: Aerobic   UBE (Upper Arm Bike) L3 x 3 min     Recumbent Bike L0 x 4 min       Lumbar Exercises: Standing   Other Standing Lumbar Exercises 2# hip abduction and extension      Lumbar Exercises: Seated   Other Seated Lumbar Exercises on sit fit pelvic mobility and stability with pelvic clocks, marches and kicks, tband scap stab all while on the sit fit for core activation      Lumbar Exercises: Supine   Bridge Non-compliant;15 reps;2 seconds    Other Supine Lumbar Exercises feet on ball K2C, trunk rotation, small bridge activation,  isometric abs      Moist Heat Therapy   Number Minutes Moist Heat 11 Minutes    Moist Heat Location Lumbar Spine      Electrical Stimulation   Electrical Stimulation Location lumbar spine    Electrical Stimulation Action IFC    Electrical Stimulation Parameters supine    Electrical Stimulation Goals Pain                    PT Short Term Goals - 10/01/19 1013      PT SHORT TERM GOAL #1   Title independent with initial HEP    Status Achieved             PT Long Term Goals - 10/01/19 1013      PT LONG TERM GOAL #1   Title indepednent with advanced HEP    Status On-going      PT LONG TERM GOAL #2   Title decrease pain 50%    Status On-going      PT LONG TERM GOAL #3   Title incresae hip strength to 4+/5    Status On-going      PT LONG TERM GOAL #4   Title report no difficulty getting up from sitting    Status On-going      PT LONG TERM GOAL #5   Title increase lumbar ROM 25%    Status On-going                 Plan - 10/04/19 1008    Clinical Impression Statement Pt enters clinic reporting increase LBP Saturday, today she states she is feeling better. Good carryover from last treatment session. Cues needed to engage core with seated exercises interventions on sit fit. Cues to needed to relax with passive stretching.    Personal Factors and Comorbidities Comorbidity 3+    Comorbidities DM, HTN, CVA, right THA, left TSA, depression    Examination-Activity Limitations Bathing;Bed Mobility;Stairs;Locomotion Level;Stand;Transfers;Sit;Carry;Lift    Examination-Participation Restrictions Laundry;Shop;Yard Work;Cleaning    Stability/Clinical Decision Making Evolving/Moderate complexity    Rehab Potential Good    PT Frequency 2x / week    PT Duration 6 weeks    PT Treatment/Interventions ADLs/Self Care Home Management;Cryotherapy;Electrical Stimulation;Ultrasound;Functional mobility training;Stair training;Gait training;Therapeutic activities;Therapeutic  exercise;Balance training;Patient/family education;Manual techniques;Dry needling    PT Next Visit Plan work on flexibility and stability           Patient will benefit from skilled therapeutic intervention in order to improve the following deficits and impairments:  Abnormal gait, Improper body mechanics, Pain, Increased muscle spasms, Decreased mobility, Decreased activity tolerance, Decreased range of motion, Decreased strength, Impaired flexibility, Difficulty walking, Postural dysfunction  Visit Diagnosis: Acute bilateral low back pain  without sciatica  Difficulty in walking, not elsewhere classified  Muscle spasm of back     Problem List Patient Active Problem List   Diagnosis Date Noted  . Other fatigue 03/05/2019  . H/O total shoulder replacement, left 10/23/2018  . PUD (peptic ulcer disease) 03/21/2018  . Nausea and vomiting 01/11/2018  . Synovial cyst of lumbar facet joint 12/23/2017  . Osteoarthritis of right hip 04/24/2017  . Primary osteoarthritis of right hip 04/24/2017  . Complete rotator cuff tear 05/09/2016  . Brainstem stroke ([place]) 01/04/2015  . Dizziness and giddiness 01/04/2015  . Post concussion syndrome 01/04/2015  . Rotator cuff tear, right 11/21/2011    Scot Jun 10/04/2019, 10:11 AM  [address] [place], [place], [address] Phone: [phone]   Fax:  [phone]  Name: OSSIE YEBRA MRN: [id number] Date of Birth: 1940-07-06

## 2019-10-06 ENCOUNTER — Telehealth: Payer: Self-pay | Admitting: *Deleted

## 2019-10-06 ENCOUNTER — Other Ambulatory Visit: Payer: Self-pay

## 2019-10-06 ENCOUNTER — Encounter: Payer: Self-pay | Admitting: Physical Therapy

## 2019-10-06 ENCOUNTER — Ambulatory Visit: Payer: Medicare PPO | Admitting: Physical Therapy

## 2019-10-06 DIAGNOSIS — M545 Low back pain, unspecified: Secondary | ICD-10-CM

## 2019-10-06 DIAGNOSIS — M62830 Muscle spasm of back: Secondary | ICD-10-CM | POA: Diagnosis not present

## 2019-10-06 DIAGNOSIS — R262 Difficulty in walking, not elsewhere classified: Secondary | ICD-10-CM

## 2019-10-06 NOTE — Therapy (Signed)
[place], [place], [address] Phone: [phone]   Fax:  [phone]  Physical Therapy Treatment  Patient Details  Name: Cheryl Cooper MRN: [id number] Date of Birth: 07/14/1940 Referring Provider (PT): Pool   Encounter Date: 10/06/2019   PT End of Session - 10/06/19 1200    Visit Number 5    Date for PT Re-Evaluation 11/05/19    Authorization Type [employer]    PT Start Time 1007    PT Stop Time 1106    PT Time Calculation (min) 59 min    Activity Tolerance Patient tolerated treatment well    Behavior During Therapy [facility] for tasks assessed/performed           Past Medical History:  Diagnosis Date  . Anxiety   . Carpal tunnel syndrome   . Deafness in left ear   . Depression   . Diabetes mellitus    diet controlled  . GERD (gastroesophageal reflux disease)   . Hypercholesteremia   . Hypertension   . Hypothyroid   . Memory loss    reports d/t concussion  . PONV (postoperative nausea and vomiting) yrs ago, none recent  . Post concussion syndrome   . Stroke [facility])    reports they were silent    Past Surgical History:  Procedure Laterality Date  . ABDOMINAL HYSTERECTOMY  1986  . BACK SURGERY  2010   lower  . BIOPSY  01/12/2018   Procedure: BIOPSY;  Surgeon: Rush Landmark Telford Nab., MD;  Location: [facility];  Service: Gastroenterology;;  . CHOLECYSTECTOMY    . ESOPHAGOGASTRODUODENOSCOPY (EGD) WITH PROPOFOL N/A 01/12/2018   Procedure: ESOPHAGOGASTRODUODENOSCOPY (EGD) WITH PROPOFOL;  Surgeon: Rush Landmark Telford Nab., MD;  [facility];  Service: Gastroenterology;  Laterality: N/A;  . left elobow surgery  2003  . left rotator cuff  2001  . LUMBAR LAMINECTOMY/DECOMPRESSION MICRODISCECTOMY Left 12/23/2017   Procedure: Laminectomy for facet/synovial cyst - left - Lumbar three-Lumbar four;  Surgeon: Earnie Larsson, MD;  Location: [facility];  Service: Neurosurgery;  Laterality: Left;  .  r foot surgery  1995  . REVERSE SHOULDER ARTHROPLASTY Left 10/23/2018   Procedure: REVERSE TOTAL SHOULDER ARTHROPLASTY;  Surgeon: Netta Cedars, MD;  [facility];  Service: Orthopedics;  Laterality: Left;  interscalene block  . right hand surgery  2001  . right index finger surgery  2009  . SHOULDER OPEN ROTATOR CUFF REPAIR  11/21/2011   Procedure: ROTATOR CUFF REPAIR SHOULDER OPEN;  Surgeon: Johnn Hai, MD;  [facility];  Service: Orthopedics;  Laterality: Right;  with subacromial decompression  . SHOULDER OPEN ROTATOR CUFF REPAIR Right 05/09/2016   Procedure: Right shoulder mini open revision rotator cuff repair, subacromial decompression;  Surgeon: Susa Day, MD;  [facility];  Service: Orthopedics;  Laterality: Right;  . TOTAL HIP ARTHROPLASTY Right 04/24/2017   Procedure: RIGHT TOTAL HIP ARTHROPLASTY ANTERIOR APPROACH;  Surgeon: Rod Can, MD;  [facility];  Service: Orthopedics;  Laterality: Right;  Needs RNFA    There were no vitals filed for this visit.   Subjective Assessment - 10/06/19 1021    Subjective Not bad today, feeling pretty good    Currently in Pain? Yes    Pain Score 3     Pain Location Back    Pain Orientation Lower    Aggravating Factors  housework  [place] Adult PT Treatment/Exercise - 10/06/19 0001      Lumbar Exercises: Stretches   Passive Hamstring Stretch Right;Left;4 reps;20 seconds    Single Knee to Chest Stretch Right;Left;4 reps;10 seconds    Piriformis Stretch Right;Left;4 reps;20 seconds      Lumbar Exercises: Aerobic   Nustep level 5 x 6 minutes      Lumbar Exercises: Machines for Strengthening   Cybex Knee Extension 5# 2x10    Cybex Knee Flexion 20# 2x10      Lumbar Exercises: Standing   Other Standing Lumbar Exercises 2# hip abduction and extension, marching      Lumbar Exercises: Supine   Bridge Non-compliant;15 reps;2 seconds    Other Supine Lumbar Exercises feet  on ball K2C, trunk rotation, small bridge activation, isometric abs      Moist Heat Therapy   Number Minutes Moist Heat 12 Minutes    Moist Heat Location Lumbar Spine      Electrical Stimulation   Electrical Stimulation Location lumbar spine    Electrical Stimulation Action IFC    Electrical Stimulation Parameters supine    Electrical Stimulation Goals Pain                    PT Short Term Goals - 10/01/19 1013      PT SHORT TERM GOAL #1   Title independent with initial HEP    Status Achieved             PT Long Term Goals - 10/06/19 1205      PT LONG TERM GOAL #1   Title indepednent with advanced HEP    Status Partially Met      PT LONG TERM GOAL #2   Title decrease pain 50%    Status On-going      PT LONG TERM GOAL #3   Title incresae hip strength to 4+/5    Status On-going      PT LONG TERM GOAL #4   Title report no difficulty getting up from sitting    Status On-going                 Plan - 10/06/19 1201    Clinical Impression Statement Patient is still very tight and having some pain in the low back, she has difficulty and requires cues to have good posture and to activate the core.  Denies and pain inot the buttocks or the legs, has better flexibility of the LE's    PT Next Visit Plan work on stability to help with her pain levels    Consulted and Agree with Plan of Care Patient           Patient will benefit from skilled therapeutic intervention in order to improve the following deficits and impairments:  Abnormal gait, Improper body mechanics, Pain, Increased muscle spasms, Decreased mobility, Decreased activity tolerance, Decreased range of motion, Decreased strength, Impaired flexibility, Difficulty walking, Postural dysfunction  Visit Diagnosis: Acute bilateral low back pain without sciatica  Difficulty in walking, not elsewhere classified  Muscle spasm of back     Problem List Patient Active Problem List   Diagnosis Date  Noted  . Other fatigue 03/05/2019  . H/O total shoulder replacement, left 10/23/2018  . PUD (peptic ulcer disease) 03/21/2018  . Nausea and vomiting 01/11/2018  . Synovial cyst of lumbar facet joint 12/23/2017  . Osteoarthritis of right hip 04/24/2017  . Primary osteoarthritis of right hip 04/24/2017  . Complete rotator cuff tear 05/09/2016  .  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  . Dizziness and giddiness 01/04/2015  . Post concussion syndrome 01/04/2015  . Rotator cuff tear, right 11/21/2011    , W., PT 10/06/2019, 12:06 PM  [facility] [address] [place], [place], [address] Phone: [phone]   Fax:  [phone]  Name: Cheryl Cooper MRN: [id number] Date of Birth: 07/30/1940   

## 2019-10-06 NOTE — Telephone Encounter (Signed)
Schedule AWV.  

## 2019-10-10 ENCOUNTER — Other Ambulatory Visit: Payer: Self-pay

## 2019-10-10 ENCOUNTER — Emergency Department (HOSPITAL_COMMUNITY): Payer: Medicare PPO

## 2019-10-10 ENCOUNTER — Emergency Department (HOSPITAL_COMMUNITY)
Admission: EM | Admit: 2019-10-10 | Discharge: 2019-10-10 | Disposition: A | Discharge status: Home / Self Care | Payer: Medicare PPO | Attending: Emergency Medicine | Admitting: Emergency Medicine

## 2019-10-10 DIAGNOSIS — E039 Hypothyroidism, unspecified: Secondary | ICD-10-CM | POA: Insufficient documentation

## 2019-10-10 DIAGNOSIS — Y9301 Activity, walking, marching and hiking: Secondary | ICD-10-CM | POA: Diagnosis not present

## 2019-10-10 DIAGNOSIS — Z8673 Personal history of transient ischemic attack (TIA), and cerebral infarction without residual deficits: Secondary | ICD-10-CM | POA: Insufficient documentation

## 2019-10-10 DIAGNOSIS — Y999 Unspecified external cause status: Secondary | ICD-10-CM | POA: Diagnosis not present

## 2019-10-10 DIAGNOSIS — Z79899 Other long term (current) drug therapy: Secondary | ICD-10-CM | POA: Insufficient documentation

## 2019-10-10 DIAGNOSIS — S7001XA Contusion of right hip, initial encounter: Secondary | ICD-10-CM | POA: Insufficient documentation

## 2019-10-10 DIAGNOSIS — M25551 Pain in right hip: Secondary | ICD-10-CM | POA: Diagnosis not present

## 2019-10-10 DIAGNOSIS — E119 Type 2 diabetes mellitus without complications: Secondary | ICD-10-CM | POA: Diagnosis not present

## 2019-10-10 DIAGNOSIS — Y92811 Bus as the place of occurrence of the external cause: Secondary | ICD-10-CM | POA: Diagnosis not present

## 2019-10-10 DIAGNOSIS — S79911A Unspecified injury of right hip, initial encounter: Secondary | ICD-10-CM | POA: Diagnosis present

## 2019-10-10 DIAGNOSIS — I10 Essential (primary) hypertension: Secondary | ICD-10-CM | POA: Insufficient documentation

## 2019-10-10 DIAGNOSIS — M545 Low back pain: Secondary | ICD-10-CM | POA: Diagnosis not present

## 2019-10-10 DIAGNOSIS — Z96641 Presence of right artificial hip joint: Secondary | ICD-10-CM | POA: Insufficient documentation

## 2019-10-10 MED ORDER — IBUPROFEN 200 MG PO TABS
400.0000 mg | ORAL_TABLET | Freq: Once | ORAL | Status: AC
  Administered 2019-10-10: 400 mg via ORAL
  Filled 2019-10-10: qty 2

## 2019-10-10 MED ORDER — OXYCODONE-ACETAMINOPHEN 5-325 MG PO TABS
1.0000 | ORAL_TABLET | Freq: Once | ORAL | Status: AC
  Administered 2019-10-10: 1 via ORAL
  Filled 2019-10-10: qty 1

## 2019-10-10 MED ORDER — GABAPENTIN 300 MG PO CAPS
300.0000 mg | ORAL_CAPSULE | Freq: Once | ORAL | Status: AC
  Administered 2019-10-10: 300 mg via ORAL
  Filled 2019-10-10: qty 1

## 2019-10-10 MED ORDER — TRAMADOL HCL 50 MG PO TABS: 50.0000 mg | ORAL_TABLET | Freq: Four times a day (QID) | ORAL | 0 refills | Status: DC | PRN

## 2019-10-10 NOTE — ED Triage Notes (Signed)
Pt fell yesterday. C/o right lower back pains. Denies LOC or taking blood thinners. Pt ambulatory in triage without assistance.

## 2019-10-10 NOTE — ED Notes (Signed)
Patient ambulatory to restroom without difficulty. 

## 2019-10-11 ENCOUNTER — Ambulatory Visit: Payer: Medicare PPO | Admitting: Physical Therapy

## 2019-10-12 DIAGNOSIS — Z471 Aftercare following joint replacement surgery: Secondary | ICD-10-CM | POA: Diagnosis not present

## 2019-10-12 DIAGNOSIS — M7918 Myalgia, other site: Secondary | ICD-10-CM | POA: Diagnosis not present

## 2019-10-12 DIAGNOSIS — Z96641 Presence of right artificial hip joint: Secondary | ICD-10-CM | POA: Diagnosis not present

## 2019-10-13 ENCOUNTER — Ambulatory Visit: Payer: Medicare PPO | Admitting: Physical Therapy

## 2019-10-13 ENCOUNTER — Other Ambulatory Visit: Payer: Self-pay | Admitting: Orthopedic Surgery

## 2019-10-13 DIAGNOSIS — M7918 Myalgia, other site: Secondary | ICD-10-CM

## 2019-10-13 NOTE — ED Provider Notes (Signed)
[place] DEPT Provider Note   CSN: [id number] Arrival date & time: 10/10/19  0932     History Chief Complaint  Patient presents with  . Fall  . Back Pain    Cheryl [facility] is a 79 y.o. female.  HPI   79 year old female with right hip pain.  She had a mechanical fall as she was getting off a bus during [place] with her husband.  She has had pain since.  Hurts at rest.  She can ambulate although pain is increased with this.  She is status post right hip replacement.   Past Medical History:  Diagnosis Date  . Anxiety   . Carpal tunnel syndrome   . Deafness in left ear   . Depression   . Diabetes mellitus    diet controlled  . GERD (gastroesophageal reflux disease)   . Hypercholesteremia   . Hypertension   . Hypothyroid   . Memory loss    reports d/t concussion  . PONV (postoperative nausea and vomiting) yrs ago, none recent  . Post concussion syndrome   . Stroke [facility])    reports they were silent    Patient Active Problem List   Diagnosis Date Noted  . Other fatigue 03/05/2019  . H/O total shoulder replacement, left 10/23/2018  . PUD (peptic ulcer disease) 03/21/2018  . Nausea and vomiting 01/11/2018  . Synovial cyst of lumbar facet joint 12/23/2017  . Osteoarthritis of right hip 04/24/2017  . Primary osteoarthritis of right hip 04/24/2017  . Complete rotator cuff tear 05/09/2016  . Brainstem stroke ([place]) 01/04/2015  . Dizziness and giddiness 01/04/2015  . Post concussion syndrome 01/04/2015  . Rotator cuff tear, right 11/21/2011    Past Surgical History:  Procedure Laterality Date  . ABDOMINAL HYSTERECTOMY  1986  . BACK SURGERY  2010   lower  . BIOPSY  01/12/2018   Procedure: BIOPSY;  Surgeon: Rush Landmark Telford Nab., MD;  Location: [facility];  Service: Gastroenterology;;  . CHOLECYSTECTOMY    . ESOPHAGOGASTRODUODENOSCOPY (EGD) WITH PROPOFOL N/A 01/12/2018   Procedure: ESOPHAGOGASTRODUODENOSCOPY (EGD) WITH  PROPOFOL;  Surgeon: Rush Landmark Telford Nab., MD;  Location: [facility];  Service: Gastroenterology;  Laterality: N/A;  . left elobow surgery  2003  . left rotator cuff  2001  . LUMBAR LAMINECTOMY/DECOMPRESSION MICRODISCECTOMY Left 12/23/2017   Procedure: Laminectomy for facet/synovial cyst - left - Lumbar three-Lumbar four;  Surgeon: Earnie Larsson, MD;  Location: [facility];  Service: Neurosurgery;  Laterality: Left;  . r foot surgery  1995  . REVERSE SHOULDER ARTHROPLASTY Left 10/23/2018   Procedure: REVERSE TOTAL SHOULDER ARTHROPLASTY;  Surgeon: Netta Cedars, MD;  Location: [facility];  Service: Orthopedics;  Laterality: Left;  interscalene block  . right hand surgery  2001  . right index finger surgery  2009  . SHOULDER OPEN ROTATOR CUFF REPAIR  11/21/2011   Procedure: ROTATOR CUFF REPAIR SHOULDER OPEN;  Surgeon: Johnn Hai, MD;  Location: [facility];  Service: Orthopedics;  Laterality: Right;  with subacromial decompression  . SHOULDER OPEN ROTATOR CUFF REPAIR Right 05/09/2016   Procedure: Right shoulder mini open revision rotator cuff repair, subacromial decompression;  Surgeon: Susa Day, MD;  Location: [facility];  Service: Orthopedics;  Laterality: Right;  . TOTAL HIP ARTHROPLASTY Right 04/24/2017   Procedure: RIGHT TOTAL HIP ARTHROPLASTY ANTERIOR APPROACH;  Surgeon: Rod Can, MD;  Location: [facility];  Service: Orthopedics;  Laterality: Right;  Needs RNFA     OB History    Gravida  5  Para  3   Term      Preterm      AB  2   Living  3     SAB  2   TAB      Ectopic      Multiple      Live Births              Family History  Problem Relation Age of Onset  . Stroke Father   . Stroke Sister   . Stroke Brother   . Diabetes Brother   . Dementia Brother   . Stroke Sister        TIA    Social History   Tobacco Use  . Smoking status: Never Smoker  . Smokeless tobacco: Never Used  Vaping Use  . Vaping Use: Never used  Substance Use Topics  . Alcohol use: Yes     Alcohol/week: 1.0 standard drink    Types: 1 Glasses of wine per week    Comment: occas  . Drug use: No    Home Medications Prior to Admission medications   Medication Sig Start Date End Date Taking? Authorizing Provider  alendronate (FOSAMAX) 70 MG tablet Take 1 tablet (70 mg total) by mouth every Sunday. Take with a full glass of water on an empty stomach. 12/27/18  Yes Wendie Agreste, MD  amLODipine (NORVASC) 5 MG tablet Take 1.5 tablets (7.5 mg total) by mouth daily. 05/04/19  Yes Wendie Agreste, MD  Calcium Carbonate-Vitamin D (CALCIUM-D PO) Take 2 tablets by mouth daily. CHEWABLES   Yes [provider]  escitalopram (LEXAPRO) 20 MG tablet Take 1 tablet (20 mg total) by mouth daily. 07/26/19  Yes Wendie Agreste, MD  esomeprazole (NEXIUM) 40 MG capsule Take 1 capsule (40 mg total) by mouth 2 (two) times daily before a meal. 01/13/18  Yes Mikhail, [place], DO  ezetimibe (ZETIA) 10 MG tablet Take 1 tablet (10 mg total) by mouth daily. 07/26/19  Yes Wendie Agreste, MD  levothyroxine (SYNTHROID) 75 MCG tablet Take 1 tablet (75 mcg total) by mouth daily with breakfast. 07/26/19  Yes Wendie Agreste, MD  meloxicam (MOBIC) 15 MG tablet Take 15 mg by mouth daily as needed for pain.  07/07/19  Yes [provider]  Multiple Vitamins-Minerals (WOMENS MULTI GUMMIES PO) Take 2 tablets by mouth daily.   Yes [provider]  oxybutynin (DITROPAN XL) 15 MG 24 hr tablet Take 1 tablet (15 mg total) by mouth at bedtime. 07/26/19  Yes Wendie Agreste, MD  simvastatin (ZOCOR) 20 MG tablet Take 1 tablet (20 mg total) by mouth at bedtime. 07/26/19  Yes Wendie Agreste, MD  traZODone (DESYREL) 50 MG tablet Take 0.5 tablets (25 mg total) by mouth at bedtime. 07/26/19  Yes Wendie Agreste, MD  traMADol (ULTRAM) 50 MG tablet Take 1 tablet (50 mg total) by mouth every 6 (six) hours as needed. 10/10/19   Virgel Manifold, MD    Allergies    Other, Flexeril [cyclobenzaprine],  Atorvastatin, Chlordiazepoxide-clidinium, and Codeine  Review of Systems   Review of Systems All systems reviewed and negative, other than as noted in HPI.  Physical Exam Updated Vital Signs BP (!) 141/83 (BP Location: Left Arm)   Pulse 65   Temp 97.8 F (36.6 C) (Oral)   Resp 16   SpO2 94%   Physical Exam Vitals and nursing note reviewed.  Constitutional:      General: She is not in acute distress.  Appearance: She is well-developed.  HENT:     Head: Normocephalic and atraumatic.  Eyes:     General:        Right eye: No discharge.        Left eye: No discharge.     Conjunctiva/sclera: Conjunctivae normal.  Cardiovascular:     Rate and Rhythm: Normal rate and regular rhythm.     Heart sounds: Normal heart sounds. No murmur heard.  No friction rub. No gallop.   Pulmonary:     Effort: Pulmonary effort is normal. No respiratory distress.     Breath sounds: Normal breath sounds.  Abdominal:     General: There is no distension.     Palpations: Abdomen is soft.     Tenderness: There is no abdominal tenderness.  Musculoskeletal:        General: Tenderness present.     Cervical back: Neck supple.     Comments: Tenderness to palpation over the right lateral hip.  She can actively range the hip seems reasonably comfortable doing so.  No midline spinal tenderness.  Skin:    General: Skin is warm and dry.  Neurological:     Mental Status: She is alert.  Psychiatric:        Behavior: Behavior normal.        Thought Content: Thought content normal.     ED Results / Procedures / Treatments   Labs (all labs ordered are listed, but only abnormal results are displayed) Labs Reviewed - No data to display  EKG None  Radiology No results found.   DG Sacrum/Coccyx  Result Date: 10/10/2019 CLINICAL DATA:  Fall yesterday with right low back and hip pain EXAM: SACRUM AND COCCYX - 2+ VIEW COMPARISON:  None. FINDINGS: Partially visualized right total hip arthroplasty. No  evidence sacral or coccygeal fracture. No focal osseous lesions. Degenerative changes in the visualized lower lumbar spine. IMPRESSION: No evidence of sacral or coccygeal fracture. Electronically Signed   By: Ilona Sorrel M.D.   On: 10/10/2019 11:37   DG Hip Unilat W or Wo Pelvis 2-3 Views Right  Result Date: 10/10/2019 CLINICAL DATA:  Fall yesterday onto right hip with right hip pain EXAM: DG HIP (WITH OR WITHOUT PELVIS) 2-3V RIGHT COMPARISON:  Intraoperative 04/24/2017 right hip radiographs FINDINGS: No pelvic fracture or diastasis. Right total hip arthroplasty with no evidence of hardware fracture or loosening. No right hip dislocation. No periprosthetic fracture. No focal osseous lesions. Degenerative changes in the visualized lower lumbar spine. IMPRESSION: No right hip dislocation. Right total hip arthroplasty without evidence of hardware complication. No osseous fracture. Electronically Signed   By: Ilona Sorrel M.D.   On: 10/10/2019 11:35   Procedures Procedures (including critical care time)  Medications Ordered in ED Medications  oxyCODONE-acetaminophen (PERCOCET/ROXICET) 5-325 MG per tablet 1 tablet (1 tablet Oral Given 10/10/19 1117)  ibuprofen (ADVIL) tablet 400 mg (400 mg Oral Given 10/10/19 1117)  gabapentin (NEURONTIN) capsule 300 mg (300 mg Oral Given 10/10/19 1117)    ED Course  I have reviewed the triage vital signs and the nursing notes.  Pertinent labs & imaging results that were available during my care of the patient were reviewed by me and considered in my medical decision making (see chart for details).    MDM Rules/Calculators/A&P                          79 year old female with right hip pain after mechanical fall.  Imaging negative for acute abnormality.  Consider occult fracture but she actually seems reasonably comfortable while ambulating.  Plan symptomatic treatment activity as tolerated.  Final Clinical Impression(s) / ED Diagnoses Final diagnoses:  Contusion  of right hip, initial encounter    Rx / DC Orders ED Discharge Orders         Ordered    traMADol (ULTRAM) 50 MG tablet  Every 6 hours PRN        10/10/19 1146           Virgel Manifold, MD 10/13/19 1038

## 2019-10-14 ENCOUNTER — Other Ambulatory Visit: Payer: Self-pay

## 2019-10-14 ENCOUNTER — Ambulatory Visit
Admission: RE | Admit: 2019-10-14 | Discharge: 2019-10-14 | Disposition: A | Discharge status: Home / Self Care | Payer: Medicare PPO | Source: Ambulatory Visit | Attending: Orthopedic Surgery | Admitting: Orthopedic Surgery

## 2019-10-14 DIAGNOSIS — M5137 Other intervertebral disc degeneration, lumbosacral region: Secondary | ICD-10-CM | POA: Diagnosis not present

## 2019-10-14 DIAGNOSIS — K5730 Diverticulosis of large intestine without perforation or abscess without bleeding: Secondary | ICD-10-CM | POA: Diagnosis not present

## 2019-10-14 DIAGNOSIS — M47816 Spondylosis without myelopathy or radiculopathy, lumbar region: Secondary | ICD-10-CM | POA: Diagnosis not present

## 2019-10-14 DIAGNOSIS — M7918 Myalgia, other site: Secondary | ICD-10-CM

## 2019-10-14 DIAGNOSIS — Z043 Encounter for examination and observation following other accident: Secondary | ICD-10-CM | POA: Diagnosis not present

## 2019-10-18 DIAGNOSIS — Z96641 Presence of right artificial hip joint: Secondary | ICD-10-CM | POA: Diagnosis not present

## 2019-10-20 ENCOUNTER — Ambulatory Visit: Payer: Medicare PPO | Admitting: Physical Therapy

## 2019-10-21 DIAGNOSIS — R1110 Vomiting, unspecified: Secondary | ICD-10-CM | POA: Diagnosis not present

## 2019-10-21 DIAGNOSIS — K5900 Constipation, unspecified: Secondary | ICD-10-CM | POA: Diagnosis not present

## 2019-10-21 DIAGNOSIS — K219 Gastro-esophageal reflux disease without esophagitis: Secondary | ICD-10-CM | POA: Diagnosis not present

## 2019-10-22 ENCOUNTER — Encounter: Payer: Medicare PPO | Admitting: Physical Therapy

## 2019-10-29 ENCOUNTER — Other Ambulatory Visit: Payer: Self-pay | Admitting: Family Medicine

## 2019-10-29 DIAGNOSIS — I10 Essential (primary) hypertension: Secondary | ICD-10-CM

## 2019-10-29 NOTE — Telephone Encounter (Signed)
Requested Prescriptions  Pending Prescriptions Disp Refills   amLODipine (NORVASC) 5 MG tablet [Pharmacy Med Name: amLODIPine BESYLATE 5 MG TAB] 135 tablet 0    Sig: TAKE 1 AND 1/2 TABLET BY MOUTH DAILY     Cardiovascular:  Calcium Channel Blockers Failed - 10/29/2019  9:49 AM      Failed - Last BP in normal range    BP Readings from Last 1 Encounters:  10/10/19 (!) 141/83         Passed - Valid encounter within last 6 months    Recent Outpatient Visits          3 months ago Type 2 diabetes, diet controlled ([place])   [facility] [place] Ranell Patrick, MD   8 months ago Other fatigue   [facility], Delfino Lovett, NP   9 months ago Essential hypertension   [facility], Ranell Patrick, MD   11 months ago Need for influenza vaccination   [facility], Ranell Patrick, MD   1 year ago Medicare annual wellness visit, subsequent   [facility], Ranell Patrick, MD      Future Appointments            In 2 months Carlota Raspberry Ranell Patrick, MD [facility] at [place], [facility]

## 2019-11-05 DIAGNOSIS — R1110 Vomiting, unspecified: Secondary | ICD-10-CM | POA: Diagnosis not present

## 2019-11-05 DIAGNOSIS — K219 Gastro-esophageal reflux disease without esophagitis: Secondary | ICD-10-CM | POA: Diagnosis not present

## 2019-11-05 DIAGNOSIS — K228 Other specified diseases of esophagus: Secondary | ICD-10-CM | POA: Diagnosis not present

## 2019-11-08 ENCOUNTER — Ambulatory Visit: Payer: Medicare PPO | Admitting: Diagnostic Neuroimaging

## 2019-11-08 ENCOUNTER — Encounter: Payer: Self-pay | Admitting: Diagnostic Neuroimaging

## 2019-11-08 VITALS — BP 153/84 | HR 61 | Ht 63.0 in | Wt 153.0 lb

## 2019-11-08 DIAGNOSIS — G959 Disease of spinal cord, unspecified: Secondary | ICD-10-CM
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
## 2019-11-09 ENCOUNTER — Telehealth: Payer: Self-pay | Admitting: Diagnostic Neuroimaging

## 2019-11-09 NOTE — Telephone Encounter (Signed)
[employer] pending  

## 2019-11-09 NOTE — Telephone Encounter (Signed)
Cheryl Cooper: [id number] (exp. 11/09/19 to 12/09/19) order sent to GI. They will reach out to the patient to schedule.

## 2019-11-11 DIAGNOSIS — M48061 Spinal stenosis, lumbar region without neurogenic claudication: Secondary | ICD-10-CM | POA: Diagnosis not present

## 2019-11-14 ENCOUNTER — Ambulatory Visit
Admission: RE | Admit: 2019-11-14 | Discharge: 2019-11-14 | Disposition: A | Discharge status: Home / Self Care | Payer: Medicare PPO | Source: Ambulatory Visit | Attending: Diagnostic Neuroimaging | Admitting: Diagnostic Neuroimaging

## 2019-11-14 DIAGNOSIS — G959 Disease of spinal cord, unspecified: Secondary | ICD-10-CM

## 2019-11-14 DIAGNOSIS — M4802 Spinal stenosis, cervical region: Secondary | ICD-10-CM | POA: Diagnosis not present

## 2019-11-15 DIAGNOSIS — S300XXD Contusion of lower back and pelvis, subsequent encounter: Secondary | ICD-10-CM | POA: Diagnosis not present

## 2019-11-16 ENCOUNTER — Ambulatory Visit (INDEPENDENT_AMBULATORY_CARE_PROVIDER_SITE_OTHER): Payer: Medicare PPO | Admitting: Registered Nurse

## 2019-11-16 ENCOUNTER — Other Ambulatory Visit: Payer: Self-pay

## 2019-11-16 DIAGNOSIS — Z23 Encounter for immunization: Secondary | ICD-10-CM | POA: Diagnosis not present

## 2019-11-16 NOTE — Patient Instructions (Signed)
° ° ° °  If you have lab work done today you will be contacted with your lab results within the next 2 weeks.  If you have not heard from us then please contact us. The fastest way to get your results is to register for My Chart. ° ° °IF you received an x-ray today, you will receive an invoice from [facility]. Please contact [facility] at [phone] with questions or concerns regarding your invoice.  ° °IF you received labwork today, you will receive an invoice from [facility]. Please contact [facility] at [phone] with questions or concerns regarding your invoice.  ° °Our billing staff will not be able to assist you with questions regarding bills from these companies. ° °You will be contacted with the lab results as soon as they are available. The fastest way to get your results is to activate your My Chart account. Instructions are located on the last page of this paperwork. If you have not heard from us regarding the results in 2 weeks, please contact this office. °  ° ° ° °

## 2019-11-18 ENCOUNTER — Encounter: Payer: Self-pay | Admitting: Physical Therapy

## 2019-11-18 ENCOUNTER — Ambulatory Visit: Payer: Medicare PPO | Attending: Neurosurgery | Admitting: Physical Therapy

## 2019-11-18 ENCOUNTER — Other Ambulatory Visit: Payer: Self-pay

## 2019-11-18 ENCOUNTER — Telehealth: Payer: Self-pay | Admitting: *Deleted

## 2019-11-18 DIAGNOSIS — M62830 Muscle spasm of back: Secondary | ICD-10-CM | POA: Insufficient documentation

## 2019-11-18 DIAGNOSIS — M545 Low back pain, unspecified: Secondary | ICD-10-CM

## 2019-11-18 DIAGNOSIS — R2689 Other abnormalities of gait and mobility: Secondary | ICD-10-CM | POA: Diagnosis not present

## 2019-11-18 DIAGNOSIS — R262 Difficulty in walking, not elsewhere classified: Secondary | ICD-10-CM | POA: Diagnosis not present

## 2019-11-18 NOTE — Patient Instructions (Signed)
Access Code: [email or web address] URL: [email or web address] Date: 11/18/2019 Prepared by: Amador Cunas  Exercises Sit to Stand without Arm Support - 1 x daily - 7 x weekly - 3 sets - 10 reps - 3 sec hold Standing Romberg to 3/4 Tandem Stance - 1 x daily - 7 x weekly - 3 sets - 3 reps - 30 sec hold Standing Tandem Balance with Counter Support - 1 x daily - 7 x weekly - 3 sets - 3 reps - 30 sec hold Supine March - 1 x daily - 7 x weekly - 3 sets - 10 reps Supine Lower Trunk Rotation - 1 x daily - 7 x weekly - 3 sets - 10 reps Supine Double Knee to Chest Modified - 1 x daily - 7 x weekly - 3 sets - 10 reps - 5 sec hold

## 2019-11-18 NOTE — Therapy (Signed)
[place]. [place], [place], [address] Phone: [phone]   Fax:  [phone]  Physical Therapy Evaluation  Patient Details  Name: Cheryl Cooper MRN: [id number] Date of Birth: 24-Feb-1940 Referring Provider (PT): Pool   Encounter Date: 11/18/2019   PT End of Session - 11/18/19 1526    Visit Number 1    Date for PT Re-Evaluation 01/18/20    Authorization Type [employer]    PT Start Time 7353    PT Stop Time 2992    PT Time Calculation (min) 50 min    Activity Tolerance Patient tolerated treatment well    Behavior During Therapy WFL for tasks assessed/performed           Past Medical History:  Diagnosis Date   Anxiety    Carpal tunnel syndrome    Deafness in left ear    Depression    Diabetes mellitus    diet controlled   GERD (gastroesophageal reflux disease)    Hypercholesteremia    Hypertension    Hypothyroid    Memory loss    reports d/t concussion   PONV (postoperative nausea and vomiting) yrs ago, none recent   Post concussion syndrome    Stroke [facility])    reports they were silent    Past Surgical History:  Procedure Laterality Date   [place]  2010   lower   BIOPSY  01/12/2018   Procedure: BIOPSY;  Surgeon: Irving Copas., MD;  Location: [facility];  Service: Gastroenterology;;   CHOLECYSTECTOMY     ESOPHAGOGASTRODUODENOSCOPY (EGD) WITH PROPOFOL N/A 01/12/2018   Procedure: ESOPHAGOGASTRODUODENOSCOPY (EGD) WITH PROPOFOL;  Surgeon: Irving Copas., MD;  Location: [facility];  Service: Gastroenterology;  Laterality: N/A;   left elobow surgery  2003   left rotator cuff  2001   LUMBAR LAMINECTOMY/DECOMPRESSION MICRODISCECTOMY Left 12/23/2017   Procedure: Laminectomy for facet/synovial cyst - left - Lumbar three-Lumbar four;  Surgeon: Earnie Larsson, MD;  Location: [place];  Service: Neurosurgery;  Laterality: Left;   r foot  surgery  1995   REVERSE SHOULDER ARTHROPLASTY Left 10/23/2018   Procedure: REVERSE TOTAL SHOULDER ARTHROPLASTY;  Surgeon: Netta Cedars, MD;  Location: [facility];  Service: Orthopedics;  Laterality: Left;  interscalene block   right hand surgery  2001   right index finger surgery  2009   SHOULDER OPEN ROTATOR CUFF REPAIR  11/21/2011   Procedure: ROTATOR CUFF REPAIR SHOULDER OPEN;  Surgeon: Johnn Hai, MD;  Location: [facility];  Service: Orthopedics;  Laterality: Right;  with subacromial decompression   SHOULDER OPEN ROTATOR CUFF REPAIR Right 05/09/2016   Procedure: Right shoulder mini open revision rotator cuff repair, subacromial decompression;  Surgeon: Susa Day, MD;  Location: [facility];  Service: Orthopedics;  Laterality: Right;   TOTAL HIP ARTHROPLASTY Right 04/24/2017   Procedure: RIGHT TOTAL HIP ARTHROPLASTY ANTERIOR APPROACH;  Surgeon: Rod Can, MD;  [facility];  Service: Orthopedics;  Laterality: Right;  Needs RNFA    There were no vitals filed for this visit.    Subjective Assessment - 11/18/19 1447    Subjective Pt reports that she fell in August and discontinued PT for a while d/t fall. Pt is unsure if she lost her balance or if she passed out. Woke up on the floor of the bus. Pt reports that she has been feeling very wobbly and would like to work on her balance in addition to the back pain.  Limitations Sitting;Lifting;Standing;Walking    Patient Stated Goals have less pain and better balance    Currently in Pain? Yes    Pain Score 7     Pain Location Back    Pain Orientation Lower    Pain Descriptors / Indicators Aching    Pain Type Chronic pain    Pain Radiating Towards denies    Pain Onset More than a month ago    Pain Frequency Constant    Aggravating Factors  housework    Pain Relieving Factors rest, recliner    Effect of Pain on Daily Activities difficulty walking, sitting and all ADLs              [facility] PT Assessment - 11/18/19 0001       Assessment   Medical Diagnosis LBP    Referring Provider (PT) Pool    Onset Date/Surgical Date 08/20/19    Next MD Visit 11/28/2019   MRI for LB   Prior Therapy for the back and the hip      Precautions   Precautions None      Restrictions   Weight Bearing Restrictions No      Balance Screen   Has the patient fallen in the past 6 months Yes    How many times? 1    Has the patient had a decrease in activity level because of a fear of falling?  No    Is the patient reluctant to leave their home because of a fear of falling?  No      Home Environment   Additional Comments has stairs, does some house and yardwork      Prior Function   Level of Independence Independent    Vocation Retired    Leisure no exercise      Posture/Postural Control   Posture Comments decreased lordosis      AROM   Overall AROM Comments lumbar AROM decreased 50%      Strength   Overall Strength Comments 4/5 BLE strength      Flexibility   Soft Tissue Assessment /Muscle Length yes    Hamstrings tight with some back pain    Quadriceps tight    ITB tight    Piriformis tight with some back pain      Palpation   Palpation comment very tender in the bilateral SI area, tender into the buttock, she is very tight in the lumbar paraspinals      Transfers   Five time sit to stand comments  fatigues quickly, some leaning with LEs into back of chair      Ambulation/Gait   Gait Comments has a slight antlagic gait on the left      Standardized Balance Assessment   Standardized Balance Assessment Berg Balance Test      Berg Balance Test   Sit to Stand Able to stand without using hands and stabilize independently    Standing Unsupported Able to stand safely 2 minutes    Sitting with Back Unsupported but Feet Supported on Floor or Stool Able to sit safely and securely 2 minutes    Stand to Sit Sits safely with minimal use of hands    Transfers Able to transfer safely, minor use of hands    Standing  Unsupported with Eyes Closed Able to stand 10 seconds safely    Standing Unsupported with Feet Together Able to place feet together independently but unable to hold for 30 seconds    From Standing, Reach Forward with  Outstretched Arm Can reach forward >12 cm safely (5")    From Standing Position, Pick up Object from [place] to pick up shoe, needs supervision    From Standing Position, Turn to Look Behind Over each Shoulder Looks behind one side only/other side shows less weight shift    Turn 360 Degrees Able to turn 360 degrees safely but slowly    Standing Unsupported, Alternately Place Feet on Step/Stool Needs assistance to keep from falling or unable to try    Standing Unsupported, One Foot in [employer] balance while stepping or standing    Standing on One Leg Tries to lift leg/unable to hold 3 seconds but remains standing independently    Total Score 38                      Objective measurements completed on examination: See above findings.       [place] Adult PT Treatment/Exercise - 11/18/19 0001      High Level Balance   High Level Balance Comments tandem balance at counter; partial tandem at counter x30 sec      Lumbar Exercises: Stretches   Double Knee to Chest Stretch 10 seconds;5 reps    Lower Trunk Rotation 5 reps;10 seconds      Lumbar Exercises: Seated   Sit to Stand 10 reps      Lumbar Exercises: Supine   Other Supine Lumbar Exercises supine marching alt LEs x10      Moist Heat Therapy   Number Minutes Moist Heat 10 Minutes    Moist Heat Location Lumbar Spine      Electrical Stimulation   Electrical Stimulation Location lumbar spine    Electrical Stimulation Action IFC    Electrical Stimulation Parameters supine    Electrical Stimulation Goals Pain                  PT Education - 11/18/19 1526    Education Details Pt educated on updated POC and HEP    Person(s) Educated Patient    Methods Explanation;Demonstration;Handout     Comprehension Verbalized understanding;Returned demonstration            PT Short Term Goals - 11/18/19 1537      PT SHORT TERM GOAL #1   Title independent with initial HEP    Time 2    Period Weeks    Status New    Target Date 12/02/19             PT Long Term Goals - 11/18/19 1538      PT LONG TERM GOAL #1   Title indepednent with advanced HEP    Time 6    Period Weeks    Status New    Target Date 12/30/19      PT LONG TERM GOAL #2   Title decrease pain 50%    Time 6    Period Weeks    Status New    Target Date 12/30/19      PT LONG TERM GOAL #3   Title incresae hip strength to 4+/5    Time 6    Period Weeks    Status New    Target Date 12/30/19      PT LONG TERM GOAL #4   Title report no difficulty getting up from sitting    Time 6    Period Weeks    Status New    Target Date 12/30/19      PT LONG  TERM GOAL #5   Title demo Berg Balance Score increased to >45/56 to decrease risk for falls    Baseline 38/56    Time 6    Period Weeks    Status New    Target Date 12/30/19                  Plan - 11/18/19 1527    Clinical Impression Statement Pt returns to clinic after sustaining a fall in 09/2019 which caused her to pause physical therapy for ~1 month. Cause of fall is still unknown. Imaging revealed no acute fx. Pt is at significant fall risk per [facility] score with most difficulty with SLS and tandem stance. Pt continues to demo decreased lumbar ROM, decreased LE flexibility, and LE strength deficits. Pt would benefit from skilled PT to address the above impairments.    Personal Factors and Comorbidities Comorbidity 3+    Comorbidities DM, HTN, CVA, right THA, left TSA, depression    Examination-Activity Limitations Bathing;Bed Mobility;Stairs;Locomotion Level;Stand;Transfers;Sit;Carry;Lift    Examination-Participation Restrictions Laundry;Shop;Yard Work;Cleaning    Stability/Clinical Decision Making Evolving/Moderate complexity     Clinical Decision Making Moderate    Rehab Potential Good    PT Frequency 2x / week    PT Duration 6 weeks    PT Treatment/Interventions ADLs/Self Care Home Management;Cryotherapy;Electrical Stimulation;Ultrasound;Functional mobility training;Stair training;Gait training;Therapeutic activities;Therapeutic exercise;Balance training;Patient/family education;Manual techniques;Dry needling    PT Next Visit Plan lumbar stability, LE strength/flexibility, balance ex's to reduce fall risk    PT Home Exercise Plan counter balance ex's, STS, LTR, supine marches, dktc    Consulted and Agree with Plan of Care Patient           Patient will benefit from skilled therapeutic intervention in order to improve the following deficits and impairments:  Abnormal gait, Improper body mechanics, Pain, Increased muscle spasms, Decreased mobility, Decreased activity tolerance, Decreased range of motion, Decreased strength, Impaired flexibility, Difficulty walking, Postural dysfunction  Visit Diagnosis: Acute bilateral low back pain without sciatica - Plan: PT plan of care cert/re-cert  Difficulty in walking, not elsewhere classified - Plan: PT plan of care cert/re-cert  Muscle spasm of back - Plan: PT plan of care cert/re-cert  Balance problem - Plan: PT plan of care cert/re-cert     Problem List Patient Active Problem List   Diagnosis Date Noted   Other fatigue 03/05/2019   H/O total shoulder replacement, left 10/23/2018   PUD (peptic ulcer disease) 03/21/2018   Nausea and vomiting 01/11/2018   Synovial cyst of lumbar facet joint 12/23/2017   Osteoarthritis of right hip 04/24/2017   Primary osteoarthritis of right hip 04/24/2017   Complete rotator cuff tear 05/09/2016   Brainstem stroke ([place]) 01/04/2015   Dizziness and giddiness 01/04/2015   Post concussion syndrome 01/04/2015   Rotator cuff tear, right 11/21/2011   Amador Cunas, PT, DPT Donald Prose Luverta Korte 11/18/2019, 3:44 PM  [place]. [place], [place], [address] Phone: [phone]   Fax:  [phone]  Name: Cheryl Cooper MRN: [id number] Date of Birth: 12/25/1940

## 2019-11-18 NOTE — Telephone Encounter (Signed)
Schedule AWV.  

## 2019-11-22 ENCOUNTER — Ambulatory Visit: Payer: Medicare PPO | Attending: Neurosurgery | Admitting: Physical Therapy

## 2019-11-22 ENCOUNTER — Encounter: Payer: Self-pay | Admitting: Physical Therapy

## 2019-11-22 ENCOUNTER — Other Ambulatory Visit: Payer: Self-pay

## 2019-11-22 DIAGNOSIS — M545 Low back pain, unspecified: Secondary | ICD-10-CM | POA: Insufficient documentation

## 2019-11-22 DIAGNOSIS — R262 Difficulty in walking, not elsewhere classified: Secondary | ICD-10-CM | POA: Diagnosis not present

## 2019-11-22 DIAGNOSIS — M25651 Stiffness of right hip, not elsewhere classified: Secondary | ICD-10-CM | POA: Insufficient documentation

## 2019-11-22 DIAGNOSIS — M25551 Pain in right hip: Secondary | ICD-10-CM | POA: Diagnosis not present

## 2019-11-22 DIAGNOSIS — R2689 Other abnormalities of gait and mobility: Secondary | ICD-10-CM | POA: Diagnosis not present

## 2019-11-22 DIAGNOSIS — M62830 Muscle spasm of back: Secondary | ICD-10-CM | POA: Diagnosis not present

## 2019-11-22 NOTE — Therapy (Signed)
[facility]. [place], [place], [address] Phone: [phone]   Fax:  [phone]  Physical Therapy Treatment  Patient Details  Name: Cheryl Cooper MRN: [id number] Date of Birth: 08-05-1940 Referring Provider (PT): Pool   Encounter Date: 11/22/2019   PT End of Session - 11/22/19 1439    Visit Number 2    Date for PT Re-Evaluation 01/18/20    PT Start Time 6962    PT Stop Time 1450    PT Time Calculation (min) 52 min    Activity Tolerance Patient tolerated treatment well    Behavior During Therapy [facility] for tasks assessed/performed           Past Medical History:  Diagnosis Date  . Anxiety   . Carpal tunnel syndrome   . Deafness in left ear   . Depression   . Diabetes mellitus    diet controlled  . GERD (gastroesophageal reflux disease)   . Hypercholesteremia   . Hypertension   . Hypothyroid   . Memory loss    reports d/t concussion  . PONV (postoperative nausea and vomiting) yrs ago, none recent  . Post concussion syndrome   . Stroke [facility])    reports they were silent    Past Surgical History:  Procedure Laterality Date  . ABDOMINAL HYSTERECTOMY  1986  . BACK SURGERY  2010   lower  . BIOPSY  01/12/2018   Procedure: BIOPSY;  Surgeon: Rush Landmark Telford Nab., MD;  Location: [facility];  Service: Gastroenterology;;  . CHOLECYSTECTOMY    . ESOPHAGOGASTRODUODENOSCOPY (EGD) WITH PROPOFOL N/A 01/12/2018   Procedure: ESOPHAGOGASTRODUODENOSCOPY (EGD) WITH PROPOFOL;  Surgeon: Rush Landmark Telford Nab., MD;  [facility];  Service: Gastroenterology;  Laterality: N/A;  . left elobow surgery  2003  . left rotator cuff  2001  . LUMBAR LAMINECTOMY/DECOMPRESSION MICRODISCECTOMY Left 12/23/2017   Procedure: Laminectomy for facet/synovial cyst - left - Lumbar three-Lumbar four;  Surgeon: Earnie Larsson, MD;  Location: [place];  Service: Neurosurgery;  Laterality: Left;  . r foot surgery  1995  . REVERSE  SHOULDER ARTHROPLASTY Left 10/23/2018   Procedure: REVERSE TOTAL SHOULDER ARTHROPLASTY;  Surgeon: Netta Cedars, MD;  [facility];  Service: Orthopedics;  Laterality: Left;  interscalene block  . right hand surgery  2001  . right index finger surgery  2009  . SHOULDER OPEN ROTATOR CUFF REPAIR  11/21/2011   Procedure: ROTATOR CUFF REPAIR SHOULDER OPEN;  Surgeon: Johnn Hai, MD;  [facility];  Service: Orthopedics;  Laterality: Right;  with subacromial decompression  . SHOULDER OPEN ROTATOR CUFF REPAIR Right 05/09/2016   Procedure: Right shoulder mini open revision rotator cuff repair, subacromial decompression;  Surgeon: Susa Day, MD;  [facility];  Service: Orthopedics;  Laterality: Right;  . TOTAL HIP ARTHROPLASTY Right 04/24/2017   Procedure: RIGHT TOTAL HIP ARTHROPLASTY ANTERIOR APPROACH;  Surgeon: Rod Can, MD;  [facility];  Service: Orthopedics;  Laterality: Right;  Needs RNFA    There were no vitals filed for this visit.   Subjective Assessment - 11/22/19 1400    Subjective Pt reports her back is really hurting her. States that the supine ex's are painful.    Currently in Pain? Yes    Pain Score 5     Pain Location Back                             [facility] Adult PT  Treatment/Exercise - 11/22/19 0001      Lumbar Exercises: Aerobic   Nustep L5 x 6 min      Lumbar Exercises: Machines for Strengthening   Cybex Knee Extension 10# 2x10   cues for full ROM   Cybex Knee Flexion 20# 2x10    Other Lumbar Machine Exercise rows and lats 15# 2x10      Lumbar Exercises: Seated   Sit to Stand Limitations STS no UE with 4# chest press 2x10      Lumbar Exercises: Supine   Bridge Compliant;10 reps;3 seconds    Other Supine Lumbar Exercises feet on ball dktc, LTR      Moist Heat Therapy   Number Minutes Moist Heat 12 Minutes    Moist Heat Location Lumbar Spine      Electrical Stimulation   Electrical Stimulation Location lumbar spine     Electrical Stimulation Action IFC    Electrical Stimulation Parameters supine    Electrical Stimulation Goals Pain                    PT Short Term Goals - 11/22/19 1442      PT SHORT TERM GOAL #1   Title independent with initial HEP    Time 2    Period Weeks    Status Partially Met    Target Date 12/02/19             PT Long Term Goals - 11/18/19 1538      PT LONG TERM GOAL #1   Title indepednent with advanced HEP    Time 6    Period Weeks    Status New    Target Date 12/30/19      PT LONG TERM GOAL #2   Title decrease pain 50%    Time 6    Period Weeks    Status New    Target Date 12/30/19      PT LONG TERM GOAL #3   Title incresae hip strength to 4+/5    Time 6    Period Weeks    Status New    Target Date 12/30/19      PT LONG TERM GOAL #4   Title report no difficulty getting up from sitting    Time 6    Period Weeks    Status New    Target Date 12/30/19      PT LONG TERM GOAL #5   Title demo Berg Balance Score increased to >45/56 to decrease risk for falls    Baseline 38/56    Time 6    Period Weeks    Status New    Target Date 12/30/19                 Plan - 11/22/19 1440    Clinical Impression Statement Pt reports that supine lumbar ex's are painful; reviewed and modified HEP to reduce discomfort with ex's with pt VU. Pt had some LBP increasing with seated rows, otherwise tolerated progression to TE well. Pt reports relief from heat and estim.    PT Treatment/Interventions ADLs/Self Care Home Management;Cryotherapy;Electrical Stimulation;Ultrasound;Functional mobility training;Stair training;Gait training;Therapeutic activities;Therapeutic exercise;Balance training;Patient/family education;Manual techniques;Dry needling    PT Next Visit Plan lumbar stability, LE strength/flexibility, balance ex's to reduce fall risk    Consulted and Agree with Plan of Care Patient           Patient will benefit from skilled therapeutic  intervention in order to improve the following deficits and  impairments:  Abnormal gait, Improper body mechanics, Pain, Increased muscle spasms, Decreased mobility, Decreased activity tolerance, Decreased range of motion, Decreased strength, Impaired flexibility, Difficulty walking, Postural dysfunction  Visit Diagnosis: Acute bilateral low back pain without sciatica  Difficulty in walking, not elsewhere classified  Muscle spasm of back  Balance problem     Problem List Patient Active Problem List   Diagnosis Date Noted  . Other fatigue 03/05/2019  . H/O total shoulder replacement, left 10/23/2018  . PUD (peptic ulcer disease) 03/21/2018  . Nausea and vomiting 01/11/2018  . Synovial cyst of lumbar facet joint 12/23/2017  . Osteoarthritis of right hip 04/24/2017  . Primary osteoarthritis of right hip 04/24/2017  . Complete rotator cuff tear 05/09/2016  . Brainstem stroke ([place]) 01/04/2015  . Dizziness and giddiness 01/04/2015  . Post concussion syndrome 01/04/2015  . Rotator cuff tear, right 11/21/2011   Amador Cunas, PT, DPT Donald Prose  11/22/2019, 2:43 PM  [place]. [place], [place], [address] Phone: [phone]   Fax:  [phone]  Name: TAZIYAH IANNUZZI MRN: [id number] Date of Birth: August 09, 1940

## 2019-11-24 ENCOUNTER — Encounter: Payer: Self-pay | Admitting: Physical Therapy

## 2019-11-24 ENCOUNTER — Other Ambulatory Visit: Payer: Self-pay

## 2019-11-24 ENCOUNTER — Ambulatory Visit: Payer: Medicare PPO | Admitting: Physical Therapy

## 2019-11-24 DIAGNOSIS — R262 Difficulty in walking, not elsewhere classified: Secondary | ICD-10-CM

## 2019-11-24 DIAGNOSIS — R2689 Other abnormalities of gait and mobility: Secondary | ICD-10-CM | POA: Diagnosis not present

## 2019-11-24 DIAGNOSIS — H9192 Unspecified hearing loss, left ear: Secondary | ICD-10-CM | POA: Diagnosis not present

## 2019-11-24 DIAGNOSIS — M62830 Muscle spasm of back: Secondary | ICD-10-CM | POA: Diagnosis not present

## 2019-11-24 DIAGNOSIS — M545 Low back pain, unspecified: Secondary | ICD-10-CM

## 2019-11-24 DIAGNOSIS — M25651 Stiffness of right hip, not elsewhere classified: Secondary | ICD-10-CM | POA: Diagnosis not present

## 2019-11-24 DIAGNOSIS — M25551 Pain in right hip: Secondary | ICD-10-CM | POA: Diagnosis not present

## 2019-11-24 NOTE — Therapy (Signed)
[address] [place], [place], [address] Phone: [phone]   Fax:  [phone]  Physical Therapy Treatment  Patient Details  Name: Cheryl Cooper MRN: [id number] Date of Birth: 06/04/1940 Referring Provider (PT): Pool   Encounter Date: 11/24/2019   PT End of Session - 11/24/19 1449    Visit Number 3    Date for PT Re-Evaluation 01/18/20    Authorization Type [employer]    PT Start Time 1410    PT Stop Time 1500    PT Time Calculation (min) 50 min    Activity Tolerance Patient tolerated treatment well    Behavior During Therapy [facility] for tasks assessed/performed           Past Medical History:  Diagnosis Date  . Anxiety   . Carpal tunnel syndrome   . Deafness in left ear   . Depression   . Diabetes mellitus    diet controlled  . GERD (gastroesophageal reflux disease)   . Hypercholesteremia   . Hypertension   . Hypothyroid   . Memory loss    reports d/t concussion  . PONV (postoperative nausea and vomiting) yrs ago, none recent  . Post concussion syndrome   . Stroke [facility])    reports they were silent    Past Surgical History:  Procedure Laterality Date  . ABDOMINAL HYSTERECTOMY  1986  . BACK SURGERY  2010   lower  . BIOPSY  01/12/2018   Procedure: BIOPSY;  Surgeon: Rush Landmark Telford Nab., MD;  Location: [facility];  Service: Gastroenterology;;  . CHOLECYSTECTOMY    . ESOPHAGOGASTRODUODENOSCOPY (EGD) WITH PROPOFOL N/A 01/12/2018   Procedure: ESOPHAGOGASTRODUODENOSCOPY (EGD) WITH PROPOFOL;  Surgeon: Rush Landmark Telford Nab., MD;  [facility];  Service: Gastroenterology;  Laterality: N/A;  . left elobow surgery  2003  . left rotator cuff  2001  . LUMBAR LAMINECTOMY/DECOMPRESSION MICRODISCECTOMY Left 12/23/2017   Procedure: Laminectomy for facet/synovial cyst - left - Lumbar three-Lumbar four;  Surgeon: Earnie Larsson, MD;  Location: [facility];  Service: Neurosurgery;  Laterality: Left;  . r foot  surgery  1995  . REVERSE SHOULDER ARTHROPLASTY Left 10/23/2018   Procedure: REVERSE TOTAL SHOULDER ARTHROPLASTY;  Surgeon: Netta Cedars, MD;  [facility];  Service: Orthopedics;  Laterality: Left;  interscalene block  . right hand surgery  2001  . right index finger surgery  2009  . SHOULDER OPEN ROTATOR CUFF REPAIR  11/21/2011   Procedure: ROTATOR CUFF REPAIR SHOULDER OPEN;  Surgeon: Johnn Hai, MD;  [facility];  Service: Orthopedics;  Laterality: Right;  with subacromial decompression  . SHOULDER OPEN ROTATOR CUFF REPAIR Right 05/09/2016   Procedure: Right shoulder mini open revision rotator cuff repair, subacromial decompression;  Surgeon: Susa Day, MD;  [facility];  Service: Orthopedics;  Laterality: Right;  . TOTAL HIP ARTHROPLASTY Right 04/24/2017   Procedure: RIGHT TOTAL HIP ARTHROPLASTY ANTERIOR APPROACH;  Surgeon: Rod Can, MD;  [facility];  Service: Orthopedics;  Laterality: Right;  Needs RNFA    There were no vitals filed for this visit.   Subjective Assessment - 11/24/19 1412    Subjective Patient reports that she went back to water aerobics yesterday, reports that she was really hurting last night and today, "maybe I did too much"    Currently in Pain? Yes    Pain Score 9     Pain Location Shoulder   back pain a 5/10   Pain Orientation Right;Left    Aggravating  Factors  "I think the water aerobics may have been too much"                             [facility] Adult PT Treatment/Exercise - 11/24/19 0001      Lumbar Exercises: Aerobic   Nustep L5 x 6 min   Simultaneous filing. User may not have seen previous data.     Lumbar Exercises: Machines for Strengthening   Cybex Knee Extension 10# 2x10   Simultaneous filing. User may not have seen previous data.   Cybex Knee Flexion 20# 2x10   Simultaneous filing. User may not have seen previous data.   Other Lumbar Machine Exercise rows and lats 15# 2x10      Lumbar Exercises:  Standing   Shoulder Extension Strengthening;Power Tower;Both;20 reps    Shoulder Extension Limitations 5lb       Lumbar Exercises: Seated   Sit to Stand Limitations STS no UE with 4# chest press 2x10      Lumbar Exercises: Supine   Other Supine Lumbar Exercises feet on ball dktc, bridges, Oblqs       Moist Heat Therapy   Number Minutes Moist Heat 12 Minutes    Moist Heat Location Lumbar Spine      Electrical Stimulation   Electrical Stimulation Location lumbar spine    Electrical Stimulation Action IFC    Electrical Stimulation Parameters supine    Electrical Stimulation Goals Pain                    PT Short Term Goals - 11/22/19 1442      PT SHORT TERM GOAL #1   Title independent with initial HEP    Time 2    Period Weeks    Status Partially Met    Target Date 12/02/19             PT Long Term Goals - 11/18/19 1538      PT LONG TERM GOAL #1   Title indepednent with advanced HEP    Time 6    Period Weeks    Status New    Target Date 12/30/19      PT LONG TERM GOAL #2   Title decrease pain 50%    Time 6    Period Weeks    Status New    Target Date 12/30/19      PT LONG TERM GOAL #3   Title incresae hip strength to 4+/5    Time 6    Period Weeks    Status New    Target Date 12/30/19      PT LONG TERM GOAL #4   Title report no difficulty getting up from sitting    Time 6    Period Weeks    Status New    Target Date 12/30/19      PT LONG TERM GOAL #5   Title demo Berg Balance Score increased to >45/56 to decrease risk for falls    Baseline 38/56    Time 6    Period Weeks    Status New    Target Date 12/30/19                 Plan - 11/24/19 1452    Clinical Impression Statement Pt reports some increase in LBP with seated HS curls. Some instability noted with supine intervention while LE on Pball indicating some core weakness. Postural cues required with seated  rows and lat pull downs. Cue needed for core engagement with  standing shoulder extensions.    Personal Factors and Comorbidities Comorbidity 3+    Comorbidities DM, HTN, CVA, right THA, left TSA, depression    Examination-Activity Limitations Bathing;Bed Mobility;Stairs;Locomotion Level;Stand;Transfers;Sit;Carry;Lift    Examination-Participation Restrictions Laundry;Shop;Yard Work;Cleaning    Stability/Clinical Decision Making Evolving/Moderate complexity    Rehab Potential Good    PT Frequency 2x / week    PT Duration 6 weeks    PT Treatment/Interventions ADLs/Self Care Home Management;Cryotherapy;Electrical Stimulation;Ultrasound;Functional mobility training;Stair training;Gait training;Therapeutic activities;Therapeutic exercise;Balance training;Patient/family education;Manual techniques;Dry needling    PT Next Visit Plan lumbar stability, LE strength/flexibility, balance ex's to reduce fall risk           Patient will benefit from skilled therapeutic intervention in order to improve the following deficits and impairments:  Abnormal gait, Improper body mechanics, Pain, Increased muscle spasms, Decreased mobility, Decreased activity tolerance, Decreased range of motion, Decreased strength, Impaired flexibility, Difficulty walking, Postural dysfunction  Visit Diagnosis: Difficulty in walking, not elsewhere classified  Acute bilateral low back pain without sciatica  Muscle spasm of back     Problem List Patient Active Problem List   Diagnosis Date Noted  . Other fatigue 03/05/2019  . H/O total shoulder replacement, left 10/23/2018  . PUD (peptic ulcer disease) 03/21/2018  . Nausea and vomiting 01/11/2018  . Synovial cyst of lumbar facet joint 12/23/2017  . Osteoarthritis of right hip 04/24/2017  . Primary osteoarthritis of right hip 04/24/2017  . Complete rotator cuff tear 05/09/2016  . Brainstem stroke ([place]) 01/04/2015  . Dizziness and giddiness 01/04/2015  . Post concussion syndrome 01/04/2015  . Rotator cuff tear, right 11/21/2011      Scot Jun, PTA 11/24/2019, 2:55 PM  [address]. [place], [place], [address] Phone: [phone]   Fax:  [phone]  Name: Cheryl Cooper MRN: [id number] Date of Birth: Sep 13, 1940

## 2019-11-29 ENCOUNTER — Other Ambulatory Visit: Payer: Self-pay

## 2019-11-29 ENCOUNTER — Encounter: Payer: Self-pay | Admitting: Physical Therapy

## 2019-11-29 ENCOUNTER — Encounter: Payer: Medicare PPO | Admitting: Physical Therapy

## 2019-11-29 ENCOUNTER — Ambulatory Visit: Payer: Medicare PPO | Admitting: Physical Therapy

## 2019-11-29 DIAGNOSIS — M62830 Muscle spasm of back: Secondary | ICD-10-CM | POA: Diagnosis not present

## 2019-11-29 DIAGNOSIS — M25651 Stiffness of right hip, not elsewhere classified: Secondary | ICD-10-CM | POA: Diagnosis not present

## 2019-11-29 DIAGNOSIS — R2689 Other abnormalities of gait and mobility: Secondary | ICD-10-CM

## 2019-11-29 DIAGNOSIS — M545 Low back pain, unspecified: Secondary | ICD-10-CM | POA: Diagnosis not present

## 2019-11-29 DIAGNOSIS — M13841 Other specified arthritis, right hand: Secondary | ICD-10-CM | POA: Diagnosis not present

## 2019-11-29 DIAGNOSIS — R262 Difficulty in walking, not elsewhere classified: Secondary | ICD-10-CM | POA: Diagnosis not present

## 2019-11-29 DIAGNOSIS — M25551 Pain in right hip: Secondary | ICD-10-CM

## 2019-11-29 DIAGNOSIS — M65341 Trigger finger, right ring finger: Secondary | ICD-10-CM | POA: Diagnosis not present

## 2019-11-29 DIAGNOSIS — M79641 Pain in right hand: Secondary | ICD-10-CM | POA: Diagnosis not present

## 2019-11-29 NOTE — Therapy (Signed)
[place] [place], [place], [address] Phone: [phone]   Fax:  [phone]  Physical Therapy Treatment  Patient Details  Name: Cheryl Cooper MRN: [id number] Date of Birth: 03/11/1940 Referring Provider (PT): Pool   Encounter Date: 11/29/2019   PT End of Session - 11/29/19 1353    Visit Number 4    Date for PT Re-Evaluation 01/18/20    Authorization Type [employer]    PT Start Time 1312    PT Stop Time 1410    PT Time Calculation (min) 58 min    Activity Tolerance Patient tolerated treatment well    Behavior During Therapy [facility] for tasks assessed/performed           Past Medical History:  Diagnosis Date  . Anxiety   . Carpal tunnel syndrome   . Deafness in left ear   . Depression   . Diabetes mellitus    diet controlled  . GERD (gastroesophageal reflux disease)   . Hypercholesteremia   . Hypertension   . Hypothyroid   . Memory loss    reports d/t concussion  . PONV (postoperative nausea and vomiting) yrs ago, none recent  . Post concussion syndrome   . Stroke [facility])    reports they were silent    Past Surgical History:  Procedure Laterality Date  . ABDOMINAL HYSTERECTOMY  1986  . BACK SURGERY  2010   lower  . BIOPSY  01/12/2018   Procedure: BIOPSY;  Surgeon: Rush Landmark Telford Nab., MD;  Location: [facility];  Service: Gastroenterology;;  . CHOLECYSTECTOMY    . ESOPHAGOGASTRODUODENOSCOPY (EGD) WITH PROPOFOL N/A 01/12/2018   Procedure: ESOPHAGOGASTRODUODENOSCOPY (EGD) WITH PROPOFOL;  Surgeon: Rush Landmark Telford Nab., MD;  [facility];  Service: Gastroenterology;  Laterality: N/A;  . left elobow surgery  2003  . left rotator cuff  2001  . LUMBAR LAMINECTOMY/DECOMPRESSION MICRODISCECTOMY Left 12/23/2017   Procedure: Laminectomy for facet/synovial cyst - left - Lumbar three-Lumbar four;  Surgeon: Earnie Larsson, MD;  Location: [facility];  Service: Neurosurgery;  Laterality: Left;  . r foot  surgery  1995  . REVERSE SHOULDER ARTHROPLASTY Left 10/23/2018   Procedure: REVERSE TOTAL SHOULDER ARTHROPLASTY;  Surgeon: Netta Cedars, MD;  [facility];  Service: Orthopedics;  Laterality: Left;  interscalene block  . right hand surgery  2001  . right index finger surgery  2009  . SHOULDER OPEN ROTATOR CUFF REPAIR  11/21/2011   Procedure: ROTATOR CUFF REPAIR SHOULDER OPEN;  Surgeon: Johnn Hai, MD;  [facility];  Service: Orthopedics;  Laterality: Right;  with subacromial decompression  . SHOULDER OPEN ROTATOR CUFF REPAIR Right 05/09/2016   Procedure: Right shoulder mini open revision rotator cuff repair, subacromial decompression;  Surgeon: Susa Day, MD;  [facility];  Service: Orthopedics;  Laterality: Right;  . TOTAL HIP ARTHROPLASTY Right 04/24/2017   Procedure: RIGHT TOTAL HIP ARTHROPLASTY ANTERIOR APPROACH;  Surgeon: Rod Can, MD;  [facility];  Service: Orthopedics;  Laterality: Right;  Needs RNFA    There were no vitals filed for this visit.   Subjective Assessment - 11/29/19 1314    Subjective Still back pain with some sitting, reports stiff left shoulder    Currently in Pain? Yes    Pain Score 6     Pain Location Back    Pain Orientation Lower    Pain Descriptors / Indicators Aching;Sore;Tightness    Aggravating Factors  reports difficulty with the shoulder with water aerobics  [facility] Adult PT Treatment/Exercise - 11/29/19 0001      Lumbar Exercises: Stretches   Passive Hamstring Stretch Right;Left;4 reps;20 seconds    Lower Trunk Rotation 5 reps;10 seconds    Piriformis Stretch Right;Left;4 reps;20 seconds      Lumbar Exercises: Aerobic   Nustep L5 x 6 min      Lumbar Exercises: Machines for Strengthening   Cybex Knee Extension 10# 2x10    Cybex Knee Flexion 20# 2x10    Other Lumbar Machine Exercise rows and lats 15# 2x10      Lumbar Exercises: Supine   Other Supine Lumbar Exercises feet  on ball dktc, bridges, Oblqs  isometric abs      Moist Heat Therapy   Number Minutes Moist Heat 12 Minutes    Moist Heat Location Lumbar Spine      Electrical Stimulation   Electrical Stimulation Location lumbar spine    Electrical Stimulation Action IFC    Electrical Stimulation Parameters supine    Electrical Stimulation Goals Pain      Manual Therapy   Manual Therapy Soft tissue mobilization    Soft tissue mobilization use of vibration to the left buttock and left lumbar area                    PT Short Term Goals - 11/22/19 1442      PT SHORT TERM GOAL #1   Title independent with initial HEP    Time 2    Period Weeks    Status Partially Met    Target Date 12/02/19             PT Long Term Goals - 11/18/19 1538      PT LONG TERM GOAL #1   Title indepednent with advanced HEP    Time 6    Period Weeks    Status New    Target Date 12/30/19      PT LONG TERM GOAL #2   Title decrease pain 50%    Time 6    Period Weeks    Status New    Target Date 12/30/19      PT LONG TERM GOAL #3   Title incresae hip strength to 4+/5    Time 6    Period Weeks    Status New    Target Date 12/30/19      PT LONG TERM GOAL #4   Title report no difficulty getting up from sitting    Time 6    Period Weeks    Status New    Target Date 12/30/19      PT LONG TERM GOAL #5   Title demo Berg Balance Score increased to >45/56 to decrease risk for falls    Baseline 38/56    Time 6    Period Weeks    Status New    Target Date 12/30/19                 Plan - 11/29/19 1354    Clinical Impression Statement Patient with some c/o left shoulder pain and stiffness, she continues to be very tight in the lumbar area, she will be having an MRI tomorrow for the back, she has not had any imaging since her fall about 2 months ago.  She has significant tenderness and knots in the lumbar and pelvica area    PT Next Visit Plan will hopefully see if MRI is negative and  progress as needed    Consulted  and Agree with Plan of Care Patient           Patient will benefit from skilled therapeutic intervention in order to improve the following deficits and impairments:  Abnormal gait, Improper body mechanics, Pain, Increased muscle spasms, Decreased mobility, Decreased activity tolerance, Decreased range of motion, Decreased strength, Impaired flexibility, Difficulty walking, Postural dysfunction  Visit Diagnosis: Difficulty in walking, not elsewhere classified  Acute bilateral low back pain without sciatica  Muscle spasm of back  Balance problem  Pain in right hip  Stiffness of right hip, not elsewhere classified     Problem List Patient Active Problem List   Diagnosis Date Noted  . Other fatigue 03/05/2019  . H/O total shoulder replacement, left 10/23/2018  . PUD (peptic ulcer disease) 03/21/2018  . Nausea and vomiting 01/11/2018  . Synovial cyst of lumbar facet joint 12/23/2017  . Osteoarthritis of right hip 04/24/2017  . Primary osteoarthritis of right hip 04/24/2017  . Complete rotator cuff tear 05/09/2016  . Brainstem stroke ([place]) 01/04/2015  . Dizziness and giddiness 01/04/2015  . Post concussion syndrome 01/04/2015  . Rotator cuff tear, right 11/21/2011    Sumner Boast., PT 11/29/2019, 1:57 PM  [place]. [place], [place], [address] Phone: [phone]   Fax:  [phone]  Name: Cheryl Cooper MRN: [id number] Date of Birth: Jul 10, 1940

## 2019-11-30 ENCOUNTER — Telehealth: Payer: Self-pay | Admitting: *Deleted

## 2019-11-30 DIAGNOSIS — M48061 Spinal stenosis, lumbar region without neurogenic claudication: Secondary | ICD-10-CM | POA: Diagnosis not present

## 2019-11-30 DIAGNOSIS — M545 Low back pain, unspecified: Secondary | ICD-10-CM | POA: Diagnosis not present

## 2019-11-30 NOTE — Telephone Encounter (Signed)
Spoke with patient and informed her the MRI cervical spine showed mild degenerative changes. Dr Leta Baptist recommends to continue conservative management. She may consider neurosurgery consult if sympoms get significantly worse.  Patient verbalized understanding, appreciation.

## 2019-12-01 ENCOUNTER — Encounter: Payer: Self-pay | Admitting: Physical Therapy

## 2019-12-01 ENCOUNTER — Other Ambulatory Visit: Payer: Self-pay

## 2019-12-01 ENCOUNTER — Ambulatory Visit: Payer: Medicare PPO | Admitting: Physical Therapy

## 2019-12-01 DIAGNOSIS — M25551 Pain in right hip: Secondary | ICD-10-CM | POA: Diagnosis not present

## 2019-12-01 DIAGNOSIS — M545 Low back pain, unspecified: Secondary | ICD-10-CM

## 2019-12-01 DIAGNOSIS — M62830 Muscle spasm of back: Secondary | ICD-10-CM | POA: Diagnosis not present

## 2019-12-01 DIAGNOSIS — M25651 Stiffness of right hip, not elsewhere classified: Secondary | ICD-10-CM | POA: Diagnosis not present

## 2019-12-01 DIAGNOSIS — R262 Difficulty in walking, not elsewhere classified: Secondary | ICD-10-CM

## 2019-12-01 DIAGNOSIS — R2689 Other abnormalities of gait and mobility: Secondary | ICD-10-CM

## 2019-12-01 NOTE — Therapy (Addendum)
[place] [address]. [place], [place], [address] Phone: [phone]   Fax:  [phone]  Physical Therapy Treatment  Patient Details  Name: Cheryl Cooper MRN: [id number] Date of Birth: Dec 18, 1940 Referring Provider (PT): Pool   Encounter Date: 12/01/2019   PT End of Session - 12/01/19 8938    Visit Number 5    Date for PT Re-Evaluation 01/18/20    Authorization Type [employer]    PT Start Time 1400    PT Stop Time 1017    PT Time Calculation (min) 49 min    Activity Tolerance Patient tolerated treatment well    Behavior During Therapy [facility] for tasks assessed/performed           Past Medical History:  Diagnosis Date  . Anxiety   . Carpal tunnel syndrome   . Deafness in left ear   . Depression   . Diabetes mellitus    diet controlled  . GERD (gastroesophageal reflux disease)   . Hypercholesteremia   . Hypertension   . Hypothyroid   . Memory loss    reports d/t concussion  . PONV (postoperative nausea and vomiting) yrs ago, none recent  . Post concussion syndrome   . Stroke [facility])    reports they were silent    Past Surgical History:  Procedure Laterality Date  . ABDOMINAL HYSTERECTOMY  1986  . BACK SURGERY  2010   lower  . BIOPSY  01/12/2018   Procedure: BIOPSY;  Surgeon: Rush Landmark Telford Nab., MD;  Location: [facility];  Service: Gastroenterology;;  . CHOLECYSTECTOMY    . ESOPHAGOGASTRODUODENOSCOPY (EGD) WITH PROPOFOL N/A 01/12/2018   Procedure: ESOPHAGOGASTRODUODENOSCOPY (EGD) WITH PROPOFOL;  Surgeon: Rush Landmark Telford Nab., MD;  [facility];  Service: Gastroenterology;  Laterality: N/A;  . left elobow surgery  2003  . left rotator cuff  2001  . LUMBAR LAMINECTOMY/DECOMPRESSION MICRODISCECTOMY Left 12/23/2017   Procedure: Laminectomy for facet/synovial cyst - left - Lumbar three-Lumbar four;  Surgeon: Earnie Larsson, MD;  Location: [facility];  Service: Neurosurgery;  Laterality: Left;  . r foot  surgery  1995  . REVERSE SHOULDER ARTHROPLASTY Left 10/23/2018   Procedure: REVERSE TOTAL SHOULDER ARTHROPLASTY;  Surgeon: Netta Cedars, MD;  [facility];  Service: Orthopedics;  Laterality: Left;  interscalene block  . right hand surgery  2001  . right index finger surgery  2009  . SHOULDER OPEN ROTATOR CUFF REPAIR  11/21/2011   Procedure: ROTATOR CUFF REPAIR SHOULDER OPEN;  Surgeon: Johnn Hai, MD;  [facility];  Service: Orthopedics;  Laterality: Right;  with subacromial decompression  . SHOULDER OPEN ROTATOR CUFF REPAIR Right 05/09/2016   Procedure: Right shoulder mini open revision rotator cuff repair, subacromial decompression;  Surgeon: Susa Day, MD;  [facility];  Service: Orthopedics;  Laterality: Right;  . TOTAL HIP ARTHROPLASTY Right 04/24/2017   Procedure: RIGHT TOTAL HIP ARTHROPLASTY ANTERIOR APPROACH;  Surgeon: Rod Can, MD;  [facility];  Service: Orthopedics;  Laterality: Right;  Needs RNFA    There were no vitals filed for this visit.   Subjective Assessment - 12/01/19 1401    Subjective Patietn reports that her back is really hurting so much, reports that she was on a hard table for about 45 minutes for an MRI yesterday    Currently in Pain? Yes    Pain Score 8     Pain Location Back    Pain Orientation Lower    Pain Descriptors / Indicators Aching  Aggravating Factors  lying on hard table for MRI yesterday made me worse                             [facility] Adult PT Treatment/Exercise - 12/01/19 0001      Ambulation/Gait   Gait Comments walking halfway around the buiilding for endurance and to check her pain levels with walking, able to make it c/o fatigue and some increase of back pain      Lumbar Exercises: Stretches   Passive Hamstring Stretch Right;Left;4 reps;20 seconds    Lower Trunk Rotation 5 reps;10 seconds    Piriformis Stretch Right;Left;4 reps;20 seconds      Lumbar Exercises: Aerobic   Nustep L5 x 6  min      Lumbar Exercises: Machines for Strengthening   Cybex Knee Extension 10# 2x10    Cybex Knee Flexion 20# 2x10      Lumbar Exercises: Standing   Shoulder Extension Strengthening;Power Tower;Both;20 reps    Shoulder Extension Limitations 5# pulley      Lumbar Exercises: Seated   Other Seated Lumbar Exercises on sit fit pelvic mobility and stability with pelvic clocks, marches and kicks, tband scap stab all while on the sit fit for core activation      Lumbar Exercises: Supine   Other Supine Lumbar Exercises feet on ball dktc, bridges, Oblqs  isometric abs      Moist Heat Therapy   Number Minutes Moist Heat 14 Minutes    Moist Heat Location Lumbar Spine      Electrical Stimulation   Electrical Stimulation Location lumbar spine    Electrical Stimulation Action IFC    Electrical Stimulation Parameters supine    Electrical Stimulation Goals Pain                    PT Short Term Goals - 11/22/19 1442      PT SHORT TERM GOAL #1   Title independent with initial HEP    Time 2    Period Weeks    Status Partially Met    Target Date 12/02/19             PT Long Term Goals - 12/01/19 1438      PT LONG TERM GOAL #1   Title indepednent with advanced HEP    Status On-going      PT LONG TERM GOAL #2   Title decrease pain 50%    Status On-going      PT LONG TERM GOAL #3   Title incresae hip strength to 4+/5    Status On-going      PT LONG TERM GOAL #4   Title report no difficulty getting up from sitting    Status Partially Met                 Plan - 12/01/19 1437    Clinical Impression Statement Patient is in more pain today, thinks it is from lying on a hard table yesterday for MRI of the low back, she really struggled more today due to pain.  I backed off of some of the exercise and will await results before adding anything    PT Next Visit Plan find out results of MRI in the next week    Consulted and Agree with Plan of Care Patient            Patient will benefit from skilled therapeutic intervention in order to improve  the following deficits and impairments:  Abnormal gait, Improper body mechanics, Pain, Increased muscle spasms, Decreased mobility, Decreased activity tolerance, Decreased range of motion, Decreased strength, Impaired flexibility, Difficulty walking, Postural dysfunction  Visit Diagnosis: Difficulty in walking, not elsewhere classified  Acute bilateral low back pain without sciatica  Muscle spasm of back  Balance problem     Problem List Patient Active Problem List   Diagnosis Date Noted  . Other fatigue 03/05/2019  . H/O total shoulder replacement, left 10/23/2018  . PUD (peptic ulcer disease) 03/21/2018  . Nausea and vomiting 01/11/2018  . Synovial cyst of lumbar facet joint 12/23/2017  . Osteoarthritis of right hip 04/24/2017  . Primary osteoarthritis of right hip 04/24/2017  . Complete rotator cuff tear 05/09/2016  . Brainstem stroke ([place]) 01/04/2015  . Dizziness and giddiness 01/04/2015  . Post concussion syndrome 01/04/2015  . Rotator cuff tear, right 11/21/2011  Patient called on 12/08/19, reported that the MD reported she did not need PT anymore.  Will D/C  Sumner Boast., PT 12/01/2019, 2:39 PM  [place]. [place], [place], [address] Phone: [phone]   Fax:  [phone]  Name: Cheryl Cooper MRN: [id number] Date of Birth: 02/05/1941

## 2019-12-04 ENCOUNTER — Other Ambulatory Visit: Payer: Self-pay

## 2019-12-04 ENCOUNTER — Ambulatory Visit: Payer: Medicare PPO | Attending: Internal Medicine

## 2019-12-04 DIAGNOSIS — Z23 Encounter for immunization: Secondary | ICD-10-CM

## 2019-12-04 NOTE — Progress Notes (Signed)
   Covid-19 Vaccination Clinic  Name:  Cheryl Cooper    MRN: [id number] DOB: 12-20-1940  12/04/2019  [name] was observed post Covid-19 immunization for 15 minutes without incident. She was provided with Vaccine Information Sheet and instruction to access the V-Safe system.   [name] was instructed to call 911 with any severe reactions post vaccine: [name] Difficulty breathing  . Swelling of face and throat  . A fast heartbeat  . A bad rash all over body  . Dizziness and weakness

## 2019-12-07 DIAGNOSIS — I10 Essential (primary) hypertension: Secondary | ICD-10-CM | POA: Diagnosis not present

## 2019-12-07 DIAGNOSIS — Z6826 Body mass index (BMI) 26.0-26.9, adult: Secondary | ICD-10-CM | POA: Diagnosis not present

## 2019-12-07 DIAGNOSIS — M48061 Spinal stenosis, lumbar region without neurogenic claudication: Secondary | ICD-10-CM | POA: Diagnosis not present

## 2019-12-13 ENCOUNTER — Ambulatory Visit: Payer: Medicare PPO | Admitting: Physical Therapy

## 2019-12-28 DIAGNOSIS — M25511 Pain in right shoulder: Secondary | ICD-10-CM | POA: Diagnosis not present

## 2020-01-11 DIAGNOSIS — M7632 Iliotibial band syndrome, left leg: Secondary | ICD-10-CM | POA: Diagnosis not present

## 2020-01-11 DIAGNOSIS — S300XXD Contusion of lower back and pelvis, subsequent encounter: Secondary | ICD-10-CM | POA: Diagnosis not present

## 2020-01-11 DIAGNOSIS — M7062 Trochanteric bursitis, left hip: Secondary | ICD-10-CM | POA: Diagnosis not present

## 2020-01-19 ENCOUNTER — Encounter: Payer: Self-pay | Admitting: Family Medicine

## 2020-01-19 ENCOUNTER — Other Ambulatory Visit: Payer: Self-pay

## 2020-01-19 ENCOUNTER — Ambulatory Visit: Payer: Medicare PPO | Admitting: Family Medicine

## 2020-01-19 VITALS — BP 122/70 | HR 77 | Temp 98.0°F | Ht 63.0 in | Wt 150.0 lb

## 2020-01-19 DIAGNOSIS — E119 Type 2 diabetes mellitus without complications: Secondary | ICD-10-CM | POA: Diagnosis not present

## 2020-01-19 DIAGNOSIS — E039 Hypothyroidism, unspecified: Secondary | ICD-10-CM | POA: Diagnosis not present

## 2020-01-19 DIAGNOSIS — R350 Frequency of micturition: Secondary | ICD-10-CM | POA: Diagnosis not present

## 2020-01-19 DIAGNOSIS — N3281 Overactive bladder: Secondary | ICD-10-CM

## 2020-01-19 DIAGNOSIS — I10 Essential (primary) hypertension: Secondary | ICD-10-CM

## 2020-01-19 DIAGNOSIS — M810 Age-related osteoporosis without current pathological fracture: Secondary | ICD-10-CM

## 2020-01-19 DIAGNOSIS — F32A Depression, unspecified: Secondary | ICD-10-CM

## 2020-01-19 DIAGNOSIS — E785 Hyperlipidemia, unspecified: Secondary | ICD-10-CM

## 2020-01-19 MED ORDER — OXYBUTYNIN CHLORIDE ER 15 MG PO TB24: 15.0000 mg | ORAL_TABLET | Freq: Every day | ORAL | 2 refills | Status: DC

## 2020-01-19 MED ORDER — ESCITALOPRAM OXALATE 20 MG PO TABS: 20.0000 mg | ORAL_TABLET | Freq: Every day | ORAL | 1 refills | Status: DC

## 2020-01-19 MED ORDER — SIMVASTATIN 20 MG PO TABS: 20.0000 mg | ORAL_TABLET | Freq: Every day | ORAL | 2 refills | Status: DC

## 2020-01-19 MED ORDER — AMLODIPINE BESYLATE 5 MG PO TABS: 7.5000 mg | ORAL_TABLET | Freq: Every day | ORAL | 1 refills | Status: DC

## 2020-01-19 MED ORDER — TRAZODONE HCL 50 MG PO TABS: 25.0000 mg | ORAL_TABLET | Freq: Every day | ORAL | 1 refills | Status: DC

## 2020-01-19 MED ORDER — EZETIMIBE 10 MG PO TABS: 10.0000 mg | ORAL_TABLET | Freq: Every day | ORAL | 2 refills | Status: DC

## 2020-01-19 MED ORDER — LEVOTHYROXINE SODIUM 75 MCG PO TABS: 75.0000 ug | ORAL_TABLET | Freq: Every day | ORAL | 2 refills | Status: DC

## 2020-01-19 NOTE — Patient Instructions (Addendum)
Let me know how things are going after physical therapy.   Can look into pain management if needed in the future. No med changes for now. Keep follow up with your other specialists.  Lab only visit in next week.  recheck in 6 months, Return to the clinic or go to the nearest emergency room if any of your symptoms worsen or new symptoms occur.    If you have lab work done today you will be contacted with your lab results within the next 2 weeks.  If you have not heard from [place] then please contact us. The fastest way to get your results is to register for My Chart.   IF you received an x-ray today, you will receive an invoice from [facility]. Please contact [facility] at [phone] with questions or concerns regarding your invoice.   IF you received labwork today, you will receive an invoice from [facility]. Please contact [facility] at [phone] with questions or concerns regarding your invoice.   Our billing staff will not be able to assist you with questions regarding bills from these companies.  You will be contacted with the lab results as soon as they are available. The fastest way to get your results is to activate your My Chart account. Instructions are located on the last page of this paperwork. If you have not heard from [place] regarding the results in 2 weeks, please contact this office.      Managing Loss, Adult People experience loss in many different ways throughout their lives. Events such as moving, changing jobs, and losing friends can create a sense of loss. The loss may be as serious as a major health change, divorce, death of a pet, or death of a loved one. All of these types of loss are likely to create a physical and emotional reaction known as grief. Grief is the result of a major change or an absence of something or someone that you count on. Grief is a normal reaction to loss. A variety of factors can affect your grieving experience, including:  The  nature of your loss.  Your relationship to what or whom you lost.  Your understanding of grief and how to manage it.  Your support system. How to manage lifestyle changes Keep to your normal routine as much as possible.  If you have trouble focusing or doing normal activities, it is acceptable to take some time away from your normal routine.  Spend time with friends and loved ones.  Eat a healthy diet, get plenty of sleep, and rest when you feel tired. How to recognize changes  The way that you deal with your grief will affect your ability to function as you normally do. When grieving, you may experience these changes:  Numbness, shock, sadness, anxiety, anger, denial, and guilt.  Thoughts about death.  Unexpected crying.  A physical sensation of emptiness in your stomach.  Problems sleeping and eating.  Tiredness (fatigue).  Loss of interest in normal activities.  Dreaming about or imagining seeing the person who died.  A need to remember what or whom you lost.  Difficulty thinking about anything other than your loss for a period of time.  Relief. If you have been expecting the loss for a while, you may feel a sense of relief when it happens. Follow these instructions at home:  Activity Express your feelings in healthy ways, such as:  Talking with others about your loss. It may be helpful to find others who have  had a similar loss, such as a support group.  Writing down your feelings in a journal.  Doing physical activities to release stress and emotional energy.  Doing creative activities like painting, sculpting, or playing or listening to music.  Practicing resilience. This is the ability to recover and adjust after facing challenges. Reading some resources that encourage resilience may help you to learn ways to practice those behaviors. General instructions  Be patient with yourself and others. Allow the grieving process to happen, and remember that  grieving takes time. ? It is likely that you may never feel completely done with some grief. You may find a way to move on while still cherishing memories and feelings about your loss. ? Accepting your loss is a process. It can take months or longer to adjust.  Keep all follow-up visits as told by your health care provider. This is important. Where to find support To get support for managing loss:  Ask your health care provider for help and recommendations, such as grief counseling or therapy.  Think about joining a support group for people who are managing a loss. Where to find more information You can find more information about managing loss from:  American Society of Clinical Oncology: www.cancer.net  American Psychological Association: TVStere[employer].ch Contact a health care provider if:  Your grief is extreme and keeps getting worse.  You have ongoing grief that does not improve.  Your body shows symptoms of grief, such as illness.  You feel depressed, anxious, or lonely. Get help right away if:  You have thoughts about hurting yourself or others. If you ever feel like you may hurt yourself or others, or have thoughts about taking your own life, get help right away. You can go to your nearest emergency department or call:  Your local emergency services (911 in the [place]  A suicide crisis helpline, such as the [facility] at 970-356-8839. This is open 24 hours a day. Summary  Grief is the result of a major change or an absence of someone or something that you count on. Grief is a normal reaction to loss.  The depth of grief and the period of recovery depend on the type of loss and your ability to adjust to the change and process your feelings.  Processing grief requires patience and a willingness to accept your feelings and talk about your loss with people who are supportive.  It is important to find resources that work for you and to realize that  people experience grief differently. There is not one grieving process that works for everyone in the same way.  Be aware that when grief becomes extreme, it can lead to more severe issues like isolation, depression, anxiety, or suicidal thoughts. Talk with your health care provider if you have any of these issues. This information is not intended to replace advice given to you by your health care provider. Make sure you discuss any questions you have with your health care provider. Document Revised: 04/10/2018 Document Reviewed: 06/20/2016 Elsevier Patient Education  [place].

## 2020-01-19 NOTE — Progress Notes (Signed)
Subjective:  Patient ID: Cheryl Cooper, female    DOB: 07/20/1940  Age: 79 y.o. MRN: [id number]  CC:  Chief Complaint  Patient presents with  . Medical Management of Chronic Issues    F/u     HPI Cheryl Cooper presents for   Hypertension: Remote history of brainstem CVA.  Amlodipine 7.5 mg daily, aspirin some days per week. No new side effects.  Home readings: no recent readings.  Cardiac echo in October 2016 with aortic sclerosis, no stenosis. EF 65 to 70%  BP Readings from Last 3 Encounters:  01/19/20 122/70  11/08/19 (!) 153/84  10/10/19 (!) 141/83   Lab Results  Component Value Date   CREATININE 0.96 07/26/2019   Hypothyroidism: Lab Results  Component Value Date   TSH 2.210 03/03/2019  Taking medication daily.  Synthroid 75 mcg No new hot or cold intolerance. No new hair or skin changes, heart palpitations or new fatigue. No new weight changes.   Hyperlipidemia: Goal LDL below 70 for secondary prevention with previous brainstem CVA.  Treated with simvastatin 20 mg daily, Zetia 10 mg daily Lab Results  Component Value Date   CHOL 178 07/26/2019   HDL 46 07/26/2019   LDLCALC 84 07/26/2019   TRIG 293 (H) 07/26/2019   CHOLHDL 3.9 07/26/2019   Lab Results  Component Value Date   ALT 44 (H) 07/26/2019   AST 45 (H) 07/26/2019   ALKPHOS 48 07/26/2019   BILITOT <0.2 07/26/2019   Depression: lexapro 20mg  qd, trazadone 50mg  - 1/2 at bedtime. Sleeping ok.  Feels down with hip issues. Unable to do her usual activities, but PT planned tomorrow.  1 glass of wine few days per week - less use.  denies SI/HI.  Brother died last month - has been doing ok but another reason for depression.  Declines counseling at this time.   Depression screen [facility] 2/9 01/19/2020 01/19/2020 07/26/2019 03/03/2019 01/25/2019  Decreased Interest 1 0 0 0 0  Down, Depressed, Hopeless 3 1 0 0 0  PHQ - 2 Score 4 1 0 0 0  Altered sleeping 0 - - - -  Tired, decreased energy 1 - - - -  Change in  appetite 0 - - - -  Feeling bad or failure about yourself  3 - - - -  Trouble concentrating 0 - - - -  Moving slowly or fidgety/restless 1 - - - -  Suicidal thoughts 0 - - - -  PHQ-9 Score 9 - - - -    Overactive bladder Ditropan XL 15 mg daily.  Stable symptoms.   Diabetes: With prior CVA.  Diet controlled.  She is on statin.  Followed by Dr. Tamala Julian with [place] [facility]. Microalbumin: Normal ratio 07/26/2019. Home reading 101 this am fasting.  Not currently fasting.  Optho, foot exam, pneumovax: Up to date  Lab Results  Component Value Date   HGBA1C 6.5 (H) 07/26/2019   HGBA1C 6.7 (H) 01/25/2019   HGBA1C 6.6 (H) 10/21/2018   Lab Results  Component Value Date   LDLCALC 84 07/26/2019   CREATININE 0.96 07/26/2019  Osteoporosis - continued to treat with Fosamax, which was started in 2018.  Discussed in June at endocrinology.  Plan to discuss drug holiday at follow-up.  Prolia had been discussed.  She has been followed by orthopedics for hand/forearm issues, shoulder issues, hip issues.    History Patient Active Problem List   Diagnosis Date Noted  . Other fatigue 03/05/2019  .  H/O total shoulder replacement, left 10/23/2018  . PUD (peptic ulcer disease) 03/21/2018  . Nausea and vomiting 01/11/2018  . Synovial cyst of lumbar facet joint 12/23/2017  . Osteoarthritis of right hip 04/24/2017  . Primary osteoarthritis of right hip 04/24/2017  . Complete rotator cuff tear 05/09/2016  . Brainstem stroke ([place]) 01/04/2015  . Dizziness and giddiness 01/04/2015  . Post concussion syndrome 01/04/2015  . Rotator cuff tear, right 11/21/2011   Past Medical History:  Diagnosis Date  . Anxiety   . Carpal tunnel syndrome   . Deafness in left ear   . Depression   . Diabetes mellitus    diet controlled  . GERD (gastroesophageal reflux disease)   . Hypercholesteremia   . Hypertension   . Hypothyroid   . Memory loss    reports d/t concussion  . PONV  (postoperative nausea and vomiting) yrs ago, none recent  . Post concussion syndrome   . Stroke [facility])    reports they were silent   Past Surgical History:  Procedure Laterality Date  . ABDOMINAL HYSTERECTOMY  1986  . BACK SURGERY  2010   lower  . BIOPSY  01/12/2018   Procedure: BIOPSY;  Surgeon: Rush Landmark Telford Nab., MD;  Location: [facility];  Service: Gastroenterology;;  . CHOLECYSTECTOMY    . ESOPHAGOGASTRODUODENOSCOPY (EGD) WITH PROPOFOL N/A 01/12/2018   Procedure: ESOPHAGOGASTRODUODENOSCOPY (EGD) WITH PROPOFOL;  Surgeon: Rush Landmark Telford Nab., MD;  Location: [facility];  Service: Gastroenterology;  Laterality: N/A;  . left elobow surgery  2003  . left rotator cuff  2001  . LUMBAR LAMINECTOMY/DECOMPRESSION MICRODISCECTOMY Left 12/23/2017   Procedure: Laminectomy for facet/synovial cyst - left - Lumbar three-Lumbar four;  Surgeon: Earnie Larsson, MD;  Location: [facility];  Service: Neurosurgery;  Laterality: Left;  . r foot surgery  1995  . REVERSE SHOULDER ARTHROPLASTY Left 10/23/2018   Procedure: REVERSE TOTAL SHOULDER ARTHROPLASTY;  Surgeon: Netta Cedars, MD;  Location: [facility];  Service: Orthopedics;  Laterality: Left;  interscalene block  . right hand surgery  2001  . right index finger surgery  2009  . SHOULDER OPEN ROTATOR CUFF REPAIR  11/21/2011   Procedure: ROTATOR CUFF REPAIR SHOULDER OPEN;  Surgeon: Johnn Hai, MD;  Location: [facility];  Service: Orthopedics;  Laterality: Right;  with subacromial decompression  . SHOULDER OPEN ROTATOR CUFF REPAIR Right 05/09/2016   Procedure: Right shoulder mini open revision rotator cuff repair, subacromial decompression;  Surgeon: Susa Day, MD;  Location: [facility];  Service: Orthopedics;  Laterality: Right;  . TOTAL HIP ARTHROPLASTY Right 04/24/2017   Procedure: RIGHT TOTAL HIP ARTHROPLASTY ANTERIOR APPROACH;  Surgeon: Rod Can, MD;  Location: [facility];  Service: Orthopedics;  Laterality: Right;  Needs RNFA   Allergies  Allergen  Reactions  . Other Other (See Comments)    Tomato sauce, garlic, onion - severe acid reflux   . Flexeril [Cyclobenzaprine] Other (See Comments)    Per spouse "she felt like a zombie"  . Atorvastatin Other (See Comments)    Unbalanced  . Chlordiazepoxide-Clidinium Other (See Comments)    Dizziness (intolerance)  . Codeine Nausea Only   Prior to Admission medications   Medication Sig Start Date End Date Taking? Authorizing Provider  alendronate (FOSAMAX) 70 MG tablet Take 1 tablet (70 mg total) by mouth every Sunday. Take with a full glass of water on an empty stomach. 12/27/18  Yes Wendie Agreste, MD  amLODipine (NORVASC) 5 MG tablet TAKE 1 AND 1/2 TABLET BY MOUTH DAILY 10/29/19  Yes  Wendie Agreste, MD  Calcium Carbonate-Vitamin D (CALCIUM-D PO) Take 2 tablets by mouth daily. CHEWABLES   Yes [provider]  escitalopram (LEXAPRO) 20 MG tablet Take 1 tablet (20 mg total) by mouth daily. 07/26/19  Yes Wendie Agreste, MD  esomeprazole (NEXIUM) 40 MG capsule Take 1 capsule (40 mg total) by mouth 2 (two) times daily before a meal. 01/13/18  Yes Mikhail, Bouton, DO  ezetimibe (ZETIA) 10 MG tablet Take 1 tablet (10 mg total) by mouth daily. 07/26/19  Yes Wendie Agreste, MD  levothyroxine (SYNTHROID) 75 MCG tablet Take 1 tablet (75 mcg total) by mouth daily with breakfast. 07/26/19  Yes Wendie Agreste, MD  meloxicam (MOBIC) 15 MG tablet Take 15 mg by mouth daily as needed for pain.  07/07/19  Yes [provider]  Multiple Vitamins-Minerals (WOMENS MULTI GUMMIES PO) Take 2 tablets by mouth daily.   Yes [provider]  oxybutynin (DITROPAN XL) 15 MG 24 hr tablet Take 1 tablet (15 mg total) by mouth at bedtime. 07/26/19  Yes Wendie Agreste, MD  simvastatin (ZOCOR) 20 MG tablet Take 1 tablet (20 mg total) by mouth at bedtime. 07/26/19  Yes Wendie Agreste, MD  traMADol (ULTRAM) 50 MG tablet Take 1 tablet (50 mg total) by mouth every 6 (six) hours as needed. 10/10/19   Yes Virgel Manifold, MD  traZODone (DESYREL) 50 MG tablet Take 0.5 tablets (25 mg total) by mouth at bedtime. 07/26/19  Yes Wendie Agreste, MD   Social History   Socioeconomic History  . Marital status: Married    Spouse name: Herschel  . Number of children: 3  . Years of education: 70  . Highest education level: Not on file  Occupational History    Comment: retired  Tobacco Use  . Smoking status: Never Smoker  . Smokeless tobacco: Never Used  Vaping Use  . Vaping Use: Never used  Substance and Sexual Activity  . Alcohol use: Yes    Alcohol/week: 1.0 standard drink    Types: 1 Glasses of wine per week    Comment: occas  . Drug use: No  . Sexual activity: Not Currently    Comment: 1st intercourse 79 yo-Fewer than 5 partners  Other Topics Concern  . Not on file  Social History Narrative   Married, lives at home with husband   caffeine - 1 Coke daily   Social Determinants of Health   Financial Resource Strain:   . Difficulty of Paying Living Expenses: Not on file  Food Insecurity:   . Worried About Charity fundraiser in the Last Year: Not on file  . Ran Out of Food in the Last Year: Not on file  Transportation Needs:   . Lack of Transportation (Medical): Not on file  . Lack of Transportation (Non-Medical): Not on file  Physical Activity:   . Days of Exercise per Week: Not on file  . Minutes of Exercise per Session: Not on file  Stress:   . Feeling of Stress : Not on file  Social Connections:   . Frequency of Communication with Friends and Family: Not on file  . Frequency of Social Gatherings with Friends and Family: Not on file  . Attends Religious Services: Not on file  . Active Member of Clubs or Organizations: Not on file  . Attends Archivist Meetings: Not on file  . Marital Status: Not on file  Intimate Partner Violence:   . Fear of Current or  Ex-Partner: Not on file  . Emotionally Abused: Not on file  . Physically Abused: Not on file  . Sexually  Abused: Not on file    Review of Systems  Constitutional: Negative for fatigue and unexpected weight change.  Respiratory: Negative for chest tightness and shortness of breath.   Cardiovascular: Negative for chest pain, palpitations and leg swelling.  Gastrointestinal: Negative for abdominal pain and blood in stool.  Neurological: Negative for dizziness, syncope, light-headedness and headaches.     Objective:   Vitals:   01/19/20 0857  BP: 122/70  Pulse: 77  Temp: 98 F (36.7 C)  TempSrc: Temporal  SpO2: 92%  Weight: 150 lb (68 kg)  Height: 5\' 3"  (1.6 m)     Physical Exam Vitals reviewed.  Constitutional:      Appearance: She is well-developed.  HENT:     Head: Normocephalic and atraumatic.  Eyes:     Conjunctiva/sclera: Conjunctivae normal.     Pupils: Pupils are equal, round, and reactive to light.  Neck:     Vascular: No carotid bruit.  Cardiovascular:     Rate and Rhythm: Normal rate and regular rhythm.     Heart sounds: Murmur (faint 2/6 SEM. ) heard.   Pulmonary:     Effort: Pulmonary effort is normal.     Breath sounds: Normal breath sounds.  Abdominal:     Palpations: Abdomen is soft. There is no pulsatile mass.     Tenderness: There is no abdominal tenderness.  Skin:    General: Skin is warm and dry.  Neurological:     Mental Status: She is alert and oriented to person, place, and time.  Psychiatric:        Behavior: Behavior normal.        Assessment & Plan:  Cheryl Cooper is a 79 y.o. female . Type 2 diabetes, diet controlled ([place]) - Plan: Comprehensive metabolic panel, Hemoglobin A1c  -Check A1c, continue endocrinology follow-up.  Overactive bladder - Plan: oxybutynin (DITROPAN XL) 15 MG 24 hr tablet  -Stable with oxybutynin.  Continue same.  Denies new side effects.  Essential hypertension - Plan: Comprehensive metabolic panel, amLODipine (NORVASC) 5 MG tablet  -Stable, continue amlodipine 5 mg.  Hypothyroidism, unspecified type -  Plan: TSH, levothyroxine (SYNTHROID) 75 MCG tablet  -Stable symptoms, continue Synthroid, check TSH with lab visit.  Hyperlipidemia, unspecified hyperlipidemia type - Plan: Comprehensive metabolic panel, Lipid panel, ezetimibe (ZETIA) 10 MG tablet, simvastatin (ZOCOR) 20 MG tablet  -Tolerating meds, fasting lab visit planned.  Continue same regimen  Osteoporosis without current pathological fracture, unspecified osteoporosis type  -Plan for follow-up with endocrinology to discuss Prolia.  Continuing on Fosamax for now.  Urinary frequency - Plan: oxybutynin (DITROPAN XL) 15 MG 24 hr tablet  Depression, unspecified depression type - Plan: escitalopram (LEXAPRO) 20 MG tablet, traZODone (DESYREL) 50 MG tablet  -Suspect some depression symptoms with chronic pain as well as recent loss of family member.  Counseling declined at this time, handout given on grief/loss of family member with RTC precautions given.  Continue same dose Lexapro and trazodone for now.  Currently on max dose Lexapro.  Meds ordered this encounter  Medications  . amLODipine (NORVASC) 5 MG tablet    Sig: Take 1.5 tablets (7.5 mg total) by mouth daily.    Dispense:  135 tablet    Refill:  1  . oxybutynin (DITROPAN XL) 15 MG 24 hr tablet    Sig: Take 1 tablet (15 mg total) by mouth  at bedtime.    Dispense:  90 tablet    Refill:  2  . ezetimibe (ZETIA) 10 MG tablet    Sig: Take 1 tablet (10 mg total) by mouth daily.    Dispense:  90 tablet    Refill:  2  . escitalopram (LEXAPRO) 20 MG tablet    Sig: Take 1 tablet (20 mg total) by mouth daily.    Dispense:  90 tablet    Refill:  1  . simvastatin (ZOCOR) 20 MG tablet    Sig: Take 1 tablet (20 mg total) by mouth at bedtime.    Dispense:  90 tablet    Refill:  2  . traZODone (DESYREL) 50 MG tablet    Sig: Take 0.5 tablets (25 mg total) by mouth at bedtime.    Dispense:  45 tablet    Refill:  1  . levothyroxine (SYNTHROID) 75 MCG tablet    Sig: Take 1 tablet (75 mcg  total) by mouth daily with breakfast.    Dispense:  90 tablet    Refill:  2   Patient Instructions   Let me know how things are going after physical therapy.   Can look into pain management if needed in the future. No med changes for now. Keep follow up with your other specialists.  Lab only visit in next week.  recheck in 6 months, Return to the clinic or go to the nearest emergency room if any of your symptoms worsen or new symptoms occur.    If you have lab work done today you will be contacted with your lab results within the next 2 weeks.  If you have not heard from [place] then please contact us. The fastest way to get your results is to register for My Chart.   IF you received an x-ray today, you will receive an invoice from [facility]. Please contact [facility] at [phone] with questions or concerns regarding your invoice.   IF you received labwork today, you will receive an invoice from [facility]. Please contact [facility] at [phone] with questions or concerns regarding your invoice.   Our billing staff will not be able to assist you with questions regarding bills from these companies.  You will be contacted with the lab results as soon as they are available. The fastest way to get your results is to activate your My Chart account. Instructions are located on the last page of this paperwork. If you have not heard from [place] regarding the results in 2 weeks, please contact this office.      Managing Loss, Adult People experience loss in many different ways throughout their lives. Events such as moving, changing jobs, and losing friends can create a sense of loss. The loss may be as serious as a major health change, divorce, death of a pet, or death of a loved one. All of these types of loss are likely to create a physical and emotional reaction known as grief. Grief is the result of a major change or an absence of something or someone that you count on. Grief is  a normal reaction to loss. A variety of factors can affect your grieving experience, including:  The nature of your loss.  Your relationship to what or whom you lost.  Your understanding of grief and how to manage it.  Your support system. How to manage lifestyle changes Keep to your normal routine as much as possible.  If you have trouble focusing or doing normal activities, it  is acceptable to take some time away from your normal routine.  Spend time with friends and loved ones.  Eat a healthy diet, get plenty of sleep, and rest when you feel tired. How to recognize changes  The way that you deal with your grief will affect your ability to function as you normally do. When grieving, you may experience these changes:  Numbness, shock, sadness, anxiety, anger, denial, and guilt.  Thoughts about death.  Unexpected crying.  A physical sensation of emptiness in your stomach.  Problems sleeping and eating.  Tiredness (fatigue).  Loss of interest in normal activities.  Dreaming about or imagining seeing the person who died.  A need to remember what or whom you lost.  Difficulty thinking about anything other than your loss for a period of time.  Relief. If you have been expecting the loss for a while, you may feel a sense of relief when it happens. Follow these instructions at home:  Activity Express your feelings in healthy ways, such as:  Talking with others about your loss. It may be helpful to find others who have had a similar loss, such as a support group.  Writing down your feelings in a journal.  Doing physical activities to release stress and emotional energy.  Doing creative activities like painting, sculpting, or playing or listening to music.  Practicing resilience. This is the ability to recover and adjust after facing challenges. Reading some resources that encourage resilience may help you to learn ways to practice those behaviors. General  instructions  Be patient with yourself and others. Allow the grieving process to happen, and remember that grieving takes time. ? It is likely that you may never feel completely done with some grief. You may find a way to move on while still cherishing memories and feelings about your loss. ? Accepting your loss is a process. It can take months or longer to adjust.  Keep all follow-up visits as told by your health care provider. This is important. Where to find support To get support for managing loss:  Ask your health care provider for help and recommendations, such as grief counseling or therapy.  Think about joining a support group for people who are managing a loss. Where to find more information You can find more information about managing loss from:  American Society of Clinical Oncology: www.cancer.net  American Psychological Association: TVStereos.ch Contact a health care provider if:  Your grief is extreme and keeps getting worse.  You have ongoing grief that does not improve.  Your body shows symptoms of grief, such as illness.  You feel depressed, anxious, or lonely. Get help right away if:  You have thoughts about hurting yourself or others. If you ever feel like you may hurt yourself or others, or have thoughts about taking your own life, get help right away. You can go to your nearest emergency department or call:  Your local emergency services (911 in the [place]  A suicide crisis helpline, such as the [place] at 929-282-2383. This is open 24 hours a day. Summary  Grief is the result of a major change or an absence of someone or something that you count on. Grief is a normal reaction to loss.  The depth of grief and the period of recovery depend on the type of loss and your ability to adjust to the change and process your feelings.  Processing grief requires patience and a willingness to accept your feelings and talk about your  loss with people who are supportive.  It is important to find resources that work for you and to realize that people experience grief differently. There is not one grieving process that works for everyone in the same way.  Be aware that when grief becomes extreme, it can lead to more severe issues like isolation, depression, anxiety, or suicidal thoughts. Talk with your health care provider if you have any of these issues. This information is not intended to replace advice given to you by your health care provider. Make sure you discuss any questions you have with your health care provider. Document Revised: 04/10/2018 Document Reviewed: 06/20/2016 Elsevier Patient Education  2020 [employer].      Signed, Merri Ray, MD [facility]

## 2020-01-20 ENCOUNTER — Encounter: Payer: Self-pay | Admitting: Physical Therapy

## 2020-01-20 ENCOUNTER — Ambulatory Visit: Payer: Medicare PPO | Attending: Orthopedic Surgery | Admitting: Physical Therapy

## 2020-01-20 DIAGNOSIS — M545 Low back pain, unspecified: Secondary | ICD-10-CM | POA: Insufficient documentation

## 2020-01-20 DIAGNOSIS — M62830 Muscle spasm of back: Secondary | ICD-10-CM | POA: Diagnosis not present

## 2020-01-20 DIAGNOSIS — R262 Difficulty in walking, not elsewhere classified: Secondary | ICD-10-CM | POA: Insufficient documentation

## 2020-01-20 DIAGNOSIS — R2689 Other abnormalities of gait and mobility: Secondary | ICD-10-CM | POA: Diagnosis not present

## 2020-01-20 DIAGNOSIS — M25551 Pain in right hip: Secondary | ICD-10-CM | POA: Diagnosis not present

## 2020-01-20 DIAGNOSIS — M25552 Pain in left hip: Secondary | ICD-10-CM | POA: Diagnosis not present

## 2020-01-20 DIAGNOSIS — M25651 Stiffness of right hip, not elsewhere classified: Secondary | ICD-10-CM | POA: Insufficient documentation

## 2020-01-20 NOTE — Therapy (Signed)
[place]. [place], [place], [address] Phone: [phone]   Fax:  [phone]  Physical Therapy Evaluation  Patient Details  Name: Cheryl Cooper MRN: [id number] Date of Birth: 1940/03/28 Referring Provider (PT): Carlota Raspberry   Encounter Date: 01/20/2020   PT End of Session - 01/20/20 1536    Visit Number 1    Date for PT Re-Evaluation 02/20/20    Authorization Type [employer]    PT Start Time 5176    PT Stop Time 1527    PT Time Calculation (min) 38 min    Activity Tolerance Patient tolerated treatment well    Behavior During Therapy [facility] for tasks assessed/performed           Past Medical History:  Diagnosis Date  . Anxiety   . Carpal tunnel syndrome   . Deafness in left ear   . Depression   . Diabetes mellitus    diet controlled  . GERD (gastroesophageal reflux disease)   . Hypercholesteremia   . Hypertension   . Hypothyroid   . Memory loss    reports d/t concussion  . PONV (postoperative nausea and vomiting) yrs ago, none recent  . Post concussion syndrome   . Stroke [facility])    reports they were silent    Past Surgical History:  Procedure Laterality Date  . ABDOMINAL HYSTERECTOMY  1986  . BACK SURGERY  2010   lower  . BIOPSY  01/12/2018   Procedure: BIOPSY;  Surgeon: Rush Landmark Telford Nab., MD;  Location: [facility];  Service: Gastroenterology;;  . CHOLECYSTECTOMY    . ESOPHAGOGASTRODUODENOSCOPY (EGD) WITH PROPOFOL N/A 01/12/2018   Procedure: ESOPHAGOGASTRODUODENOSCOPY (EGD) WITH PROPOFOL;  Surgeon: Rush Landmark Telford Nab., MD;  Location: [facility];  Service: Gastroenterology;  Laterality: N/A;  . left elobow surgery  2003  . left rotator cuff  2001  . LUMBAR LAMINECTOMY/DECOMPRESSION MICRODISCECTOMY Left 12/23/2017   Procedure: Laminectomy for facet/synovial cyst - left - Lumbar three-Lumbar four;  Surgeon: Earnie Larsson, MD;  Location: [place];  Service: Neurosurgery;  Laterality: Left;  . r foot  surgery  1995  . REVERSE SHOULDER ARTHROPLASTY Left 10/23/2018   Procedure: REVERSE TOTAL SHOULDER ARTHROPLASTY;  Surgeon: Netta Cedars, MD;  Location: [facility];  Service: Orthopedics;  Laterality: Left;  interscalene block  . right hand surgery  2001  . right index finger surgery  2009  . SHOULDER OPEN ROTATOR CUFF REPAIR  11/21/2011   Procedure: ROTATOR CUFF REPAIR SHOULDER OPEN;  Surgeon: Johnn Hai, MD;  Location: [facility];  Service: Orthopedics;  Laterality: Right;  with subacromial decompression  . SHOULDER OPEN ROTATOR CUFF REPAIR Right 05/09/2016   Procedure: Right shoulder mini open revision rotator cuff repair, subacromial decompression;  Surgeon: Susa Day, MD;  [facility];  Service: Orthopedics;  Laterality: Right;  . TOTAL HIP ARTHROPLASTY Right 04/24/2017   Procedure: RIGHT TOTAL HIP ARTHROPLASTY ANTERIOR APPROACH;  Surgeon: Rod Can, MD;  [facility];  Service: Orthopedics;  Laterality: Right;  Needs RNFA    There were no vitals filed for this visit.    Subjective Assessment - 01/20/20 1454    Subjective Pt was attending PT for L LBP earlier this year. Back got slightly better but then had to take a break from PT d/t family emergency. Since then pt has developed L hip pain which has progressively gotten worse. Pt denies radiating pain. States that MD dx as L hip bursitis. No imaging to L hip.  Pertinent History HTN    Limitations Sitting;Standing;Walking    Diagnostic tests none for L hip; xrays for R hip    Patient Stated Goals reduce pain    Currently in Pain? Yes    Pain Score 8     Pain Location Hip   and low back   Pain Orientation Lower;Left;Lateral    Pain Descriptors / Indicators Aching    Pain Type Chronic pain    Pain Onset More than a month ago    Pain Frequency Constant    Aggravating Factors  bending over, walking, lifting    Pain Relieving Factors rest, sitting down, heat, pain meds              [facility] PT Assessment - 01/20/20 0001       Assessment   Medical Diagnosis L hip pain    Referring Provider (PT) Carlota Raspberry    Prior Therapy PT for LBP earlier this year      Precautions   Precautions None      Restrictions   Weight Bearing Restrictions No      Balance Screen   Has the patient fallen in the past 6 months Yes    How many times? 1    Has the patient had a decrease in activity level because of a fear of falling?  No    Is the patient reluctant to leave their home because of a fear of falling?  No      Home Environment   Additional Comments has stairs, does some house and yardwork      Prior Function   Level of Independence Independent    Vocation Retired    Leisure sedentary      Functional Tests   Functional tests Sit to [employer] to Stand   Comments [facility]      Posture/Postural Control   Posture/Postural Control Postural limitations    Postural Limitations Rounded Shoulders;Forward head;Decreased lumbar lordosis      ROM / Strength   AROM / PROM / Strength AROM;Strength      AROM   Overall AROM Comments lumbar AROM 25% limited with pain esp with end range lumbar flexion      Strength   Overall Strength Comments BLE 4+/5 except hip abd/ext 4/5      Flexibility   Soft Tissue Assessment /Muscle Length yes    Hamstrings tight with some back pain    Quadriceps tight    ITB tight    Piriformis tight with some back pain      Palpation   Spinal mobility hypomobility of lumbar spine    Palpation comment tender to palpation L ITB, L piriformis/glute      Special Tests    Special Tests Hip Special Tests    Hip Special Tests  Hip Scouring;Patrick (FABER) Test;Ober's Test      Saralyn Pilar (FABER) Test   Findings Negative    Side Left      Ober's Test   Findings Positive    Side Left      Hip Scouring   Findings Negative    Side Left      Transfers   Five time sit to stand comments  [facility]      Ambulation/Gait   Gait Comments antalgic gait on LLE with decreased trunk rotation and  decreased reciprocal arm swing  Objective measurements completed on examination: See above findings.       [place] [facility] [place] [facility] Adult PT Treatment/Exercise - 01/20/20 0001      Exercises   Exercises Lumbar;Knee/Hip      Lumbar Exercises: Stretches   Active Hamstring Stretch Right;Left;1 rep;20 seconds    Single Knee to Chest Stretch Right;Left;2 reps;20 seconds    Lower Trunk Rotation 5 reps;10 seconds    Piriformis Stretch Right;Left;20 seconds;1 rep    Piriformis Stretch Limitations supine and seated                  PT Education - 01/20/20 1536    Education Details Pt educated on POC and HEP    Person(s) Educated Patient    Methods Explanation;Demonstration;Handout    Comprehension Verbalized understanding;Returned demonstration            PT Short Term Goals - 01/20/20 1540      PT SHORT TERM GOAL #1   Title independent with initial HEP    Time 2    Period Weeks    Status New    Target Date 02/03/20             PT Long Term Goals - 01/20/20 1541      PT LONG TERM GOAL #1   Title indepednent with advanced HEP    Time 6    Period Weeks    Status New    Target Date 03/02/20      PT LONG TERM GOAL #2   Title decrease pain 50%    Time 6    Period Weeks    Status New    Target Date 03/02/20      PT LONG TERM GOAL #3   Title incresae hip strength to 4+/5    Time 6    Period Weeks    Status New    Target Date 03/02/20      PT LONG TERM GOAL #4   Title report no difficulty getting up from sitting    Time 6    Period Weeks    Status New    Target Date 03/02/20      PT LONG TERM GOAL #5   Title demo Berg Balance Score increased to >45/56 to decrease risk for falls    Baseline 38/56    Time 6    Period Weeks    Status New    Target Date 03/02/20                  Plan - 01/20/20 1537    Clinical Impression Statement Pt presents to clinic with reports of new L hip pain. Pt demos L ITB tenderness and  tightness along with tenderness over L greater trochanter. Pt was seen by this clinic in Oct 2021 for LBP; still demos L LBP along with LE stiffness, lumbar AROM deficits, and tenderness to palpation L glute/piriformis. Most pain seems to be localized to L side. Pt demos stiffness throughout lumbar spine with decreased spinal mobility and decreased trunk rotation with gait. Antalgic gait with decreased stance time on LLE. Pt would benefit from skilled PT to address the above impairments.    Personal Factors and Comorbidities Comorbidity 3+    Comorbidities DM, HTN, CVA, right THA, left TSA, depression    Examination-Activity Limitations Bathing;Bed Mobility;Stairs;Locomotion Level;Stand;Transfers;Sit;Carry;Lift    Examination-Participation Restrictions Laundry;Shop;Yard Work;Cleaning    Stability/Clinical Decision Making Evolving/Moderate complexity    Clinical Decision Making Low    Rehab Potential Good  PT Frequency 2x / week    PT Duration 6 weeks    PT Treatment/Interventions ADLs/Self Care Home Management;Cryotherapy;Electrical Stimulation;Ultrasound;Functional mobility training;Stair training;Gait training;Therapeutic activities;Therapeutic exercise;Balance training;Patient/family education;Manual techniques;Dry needling    PT Next Visit Plan LE/lumbar flexibility and strengthening, manual/modalities as indicated    PT Home Exercise Plan piriformis stretch, hamstring stretch, LTR, SKTC    Consulted and Agree with Plan of Care Patient           Patient will benefit from skilled therapeutic intervention in order to improve the following deficits and impairments:  Abnormal gait, Improper body mechanics, Pain, Increased muscle spasms, Decreased mobility, Decreased activity tolerance, Decreased range of motion, Decreased strength, Impaired flexibility, Difficulty walking, Postural dysfunction  Visit Diagnosis: Difficulty in walking, not elsewhere classified  Pain in left hip  Acute  bilateral low back pain without sciatica     Problem List Patient Active Problem List   Diagnosis Date Noted  . Other fatigue 03/05/2019  . H/O total shoulder replacement, left 10/23/2018  . PUD (peptic ulcer disease) 03/21/2018  . Nausea and vomiting 01/11/2018  . Synovial cyst of lumbar facet joint 12/23/2017  . Osteoarthritis of right hip 04/24/2017  . Primary osteoarthritis of right hip 04/24/2017  . Complete rotator cuff tear 05/09/2016  . Brainstem stroke ([place]) 01/04/2015  . Dizziness and giddiness 01/04/2015  . Post concussion syndrome 01/04/2015  . Rotator cuff tear, right 11/21/2011   Amador Cunas, PT, DPT [place] [facility] [address] 01/20/2020, 3:42 PM  [place]. [place], [place], [address] Phone: [phone]   Fax:  [phone]  Name: Cheryl Cooper MRN: [id number] Date of Birth: 02/14/1941

## 2020-01-20 NOTE — Patient Instructions (Signed)
Access Code: [email or web address] URL: [email or web address] Date: 01/20/2020 Prepared by: Amador Cunas  Exercises Supine Lower Trunk Rotation - 1 x daily - 7 x weekly - 3 sets - 10 reps - 5-10 sec hold Supine Piriformis Stretch with Leg Straight - 1 x daily - 7 x weekly - 3 sets - 2 reps - 20-30 sec hold Supine Single Knee to Chest Stretch - 1 x daily - 7 x weekly - 3 sets - 3 reps - 15-20 sec hold Seated Piriformis Stretch - 1 x daily - 7 x weekly - 3 sets - 2 reps - 20-30 sec hold Seated Hamstring Stretch - 1 x daily - 7 x weekly - 3 sets - 2 reps - 20-30 sec hold

## 2020-01-21 ENCOUNTER — Other Ambulatory Visit: Payer: Self-pay

## 2020-01-21 ENCOUNTER — Ambulatory Visit (INDEPENDENT_AMBULATORY_CARE_PROVIDER_SITE_OTHER): Payer: Medicare PPO | Admitting: Family Medicine

## 2020-01-21 DIAGNOSIS — E785 Hyperlipidemia, unspecified: Secondary | ICD-10-CM | POA: Diagnosis not present

## 2020-01-21 DIAGNOSIS — E119 Type 2 diabetes mellitus without complications: Secondary | ICD-10-CM | POA: Diagnosis not present

## 2020-01-21 DIAGNOSIS — I10 Essential (primary) hypertension: Secondary | ICD-10-CM | POA: Diagnosis not present

## 2020-01-21 DIAGNOSIS — E039 Hypothyroidism, unspecified: Secondary | ICD-10-CM | POA: Diagnosis not present

## 2020-01-22 LAB — LIPID PANEL
Chol/HDL Ratio: 3.1 ratio (ref 0.0–4.4)
Cholesterol, Total: 147 mg/dL (ref 100–199)
HDL: 48 mg/dL (ref >39)
LDL Chol Calc (NIH): 77 mg/dL (ref 0–99)
Triglycerides: 124 mg/dL (ref 0–149)
VLDL Cholesterol Cal: 22 mg/dL (ref 5–40)

## 2020-01-22 LAB — COMPREHENSIVE METABOLIC PANEL
ALT: 302 IU/L — ABNORMAL HIGH (ref 0–32)
AST: 222 IU/L — ABNORMAL HIGH (ref 0–40)
Albumin/Globulin Ratio: 1.5 (ref 1.2–2.2)
Albumin: 4 g/dL (ref 3.7–4.7)
Alkaline Phosphatase: 60 IU/L (ref 44–121)
BUN/Creatinine Ratio: 15 (ref 12–28)
BUN: 14 mg/dL (ref 8–27)
Bilirubin Total: 0.2 mg/dL (ref 0.0–1.2)
CO2: 24 mmol/L (ref 20–29)
Calcium: 9.2 mg/dL (ref 8.7–10.3)
Chloride: 104 mmol/L (ref 96–106)
Creatinine, Ser: 0.94 mg/dL (ref 0.57–1.00)
GFR calc Af Amer: 68 mL/min/{1.73_m2} (ref >59)
GFR calc non Af Amer: 59 mL/min/{1.73_m2} — ABNORMAL LOW (ref >59)
Globulin, Total: 2.7 g/dL (ref 1.5–4.5)
Glucose: 115 mg/dL — ABNORMAL HIGH (ref 65–99)
Potassium: 4.5 mmol/L (ref 3.5–5.2)
Sodium: 141 mmol/L (ref 134–144)
Total Protein: 6.7 g/dL (ref 6.0–8.5)

## 2020-01-22 LAB — HEMOGLOBIN A1C
Est. average glucose Bld gHb Est-mCnc: 146 mg/dL
Hgb A1c MFr Bld: 6.7 % — ABNORMAL HIGH (ref 4.8–5.6)

## 2020-01-22 LAB — TSH: TSH: 3.74 u[IU]/mL (ref 0.450–4.500)

## 2020-01-27 ENCOUNTER — Other Ambulatory Visit: Payer: Self-pay

## 2020-01-27 ENCOUNTER — Encounter: Payer: Self-pay | Admitting: Physical Therapy

## 2020-01-27 ENCOUNTER — Ambulatory Visit: Payer: Medicare PPO | Admitting: Physical Therapy

## 2020-01-27 DIAGNOSIS — M62830 Muscle spasm of back: Secondary | ICD-10-CM | POA: Diagnosis not present

## 2020-01-27 DIAGNOSIS — R262 Difficulty in walking, not elsewhere classified: Secondary | ICD-10-CM | POA: Diagnosis not present

## 2020-01-27 DIAGNOSIS — M25552 Pain in left hip: Secondary | ICD-10-CM | POA: Diagnosis not present

## 2020-01-27 DIAGNOSIS — M25651 Stiffness of right hip, not elsewhere classified: Secondary | ICD-10-CM | POA: Diagnosis not present

## 2020-01-27 DIAGNOSIS — R2689 Other abnormalities of gait and mobility: Secondary | ICD-10-CM

## 2020-01-27 DIAGNOSIS — M25551 Pain in right hip: Secondary | ICD-10-CM | POA: Diagnosis not present

## 2020-01-27 DIAGNOSIS — M545 Low back pain, unspecified: Secondary | ICD-10-CM

## 2020-01-27 NOTE — Therapy (Signed)
[place]. [place], [place], [address] Phone: [phone]   Fax:  [phone]  Physical Therapy Treatment  Patient Details  Name: Cheryl Cooper MRN: [id number] Date of Birth: [birth year] Referring Provider (PT): Carlota Raspberry   Encounter Date: 01/27/2020   PT End of Session - 01/27/20 1017    Visit Number 2    Date for PT Re-Evaluation 03/22/20    Authorization Type [employer]    PT Start Time 0845    PT Stop Time 0927    PT Time Calculation (min) 42 min    Activity Tolerance Patient tolerated treatment well    Behavior During Therapy [facility] for tasks assessed/performed           Past Medical History:  Diagnosis Date  . Anxiety   . Carpal tunnel syndrome   . Deafness in left ear   . Depression   . Diabetes mellitus    diet controlled  . GERD (gastroesophageal reflux disease)   . Hypercholesteremia   . Hypertension   . Hypothyroid   . Memory loss    reports d/t concussion  . PONV (postoperative nausea and vomiting) yrs ago, none recent  . Post concussion syndrome   . Stroke [facility])    reports they were silent    Past Surgical History:  Procedure Laterality Date  . ABDOMINAL HYSTERECTOMY  1986  . BACK SURGERY  2010   lower  . BIOPSY  01/12/2018   Procedure: BIOPSY;  Surgeon: Rush Landmark Telford Nab., MD;  Location: [facility];  Service: Gastroenterology;;  . CHOLECYSTECTOMY    . ESOPHAGOGASTRODUODENOSCOPY (EGD) WITH PROPOFOL N/A 01/12/2018   Procedure: ESOPHAGOGASTRODUODENOSCOPY (EGD) WITH PROPOFOL;  Surgeon: Rush Landmark Telford Nab., MD;  [facility];  Service: Gastroenterology;  Laterality: N/A;  . left elobow surgery  2003  . left rotator cuff  2001  . LUMBAR LAMINECTOMY/DECOMPRESSION MICRODISCECTOMY Left 12/23/2017   Procedure: Laminectomy for facet/synovial cyst - left - Lumbar three-Lumbar four;  Surgeon: Earnie Larsson, MD;  Location: [place];  Service: Neurosurgery;  Laterality: Left;  . r foot  surgery  1995  . REVERSE SHOULDER ARTHROPLASTY Left 10/23/2018   Procedure: REVERSE TOTAL SHOULDER ARTHROPLASTY;  Surgeon: Netta Cedars, MD;  [facility];  Service: Orthopedics;  Laterality: Left;  interscalene block  . right hand surgery  2001  . right index finger surgery  2009  . SHOULDER OPEN ROTATOR CUFF REPAIR  11/21/2011   Procedure: ROTATOR CUFF REPAIR SHOULDER OPEN;  Surgeon: Johnn Hai, MD;  [facility];  Service: Orthopedics;  Laterality: Right;  with subacromial decompression  . SHOULDER OPEN ROTATOR CUFF REPAIR Right 05/09/2016   Procedure: Right shoulder mini open revision rotator cuff repair, subacromial decompression;  Surgeon: Susa Day, MD;  [facility];  Service: Orthopedics;  Laterality: Right;  . TOTAL HIP ARTHROPLASTY Right 04/24/2017   Procedure: RIGHT TOTAL HIP ARTHROPLASTY ANTERIOR APPROACH;  Surgeon: Rod Can, MD;  [facility];  Service: Orthopedics;  Laterality: Right;  Needs RNFA    There were no vitals filed for this visit.   Subjective Assessment - 01/27/20 0850    Subjective Patient reports a fall over the weekend, reports her left shoulder hurts.  No increase pain in the left hip.  She reports that the stretches fel good.    Currently in Pain? Yes    Pain Score 6     Pain Location Hip    Pain Orientation Left    Pain Descriptors /  Indicators Aching    Aggravating Factors  walking and bending                             [facility] Adult PT Treatment/Exercise - 01/27/20 0001      Lumbar Exercises: Stretches   Passive Hamstring Stretch Right;Left;4 reps;20 seconds    Single Knee to Chest Stretch Right;Left;2 reps;20 seconds    Lower Trunk Rotation 5 reps;10 seconds    Piriformis Stretch Right;Left;3 reps;20 seconds    Gastroc Stretch Right;Left;3 reps;20 seconds      Lumbar Exercises: Aerobic   Nustep L5 x 6 min      Lumbar Exercises: Standing   Other Standing Lumbar Exercises 3# hip marches, abduction and  extension holding walker      Lumbar Exercises: Seated   Long Arc Quad on Chair Both;2 sets;10 reps    LAQ on Chair Weights (lbs) 3    Other Seated Lumbar Exercises on sit fit pelvic mobility and stability with pelvic clocks, marches and kicks, tband scap stab all while on the sit fit for core activation    Other Seated Lumbar Exercises physioball in lap isometric abs      Modalities   Modalities Iontophoresis      Iontophoresis   Type of Iontophoresis Dexamethasone    Location right GT area    Dose 74mA    Time 4 hour patch                    PT Short Term Goals - 01/20/20 1540      PT SHORT TERM GOAL #1   Title independent with initial HEP    Time 2    Period Weeks    Status New    Target Date 02/03/20             PT Long Term Goals - 01/27/20 1202      PT LONG TERM GOAL #1   Title indepednent with advanced HEP    Status On-going      PT LONG TERM GOAL #2   Title decrease pain 50%    Status On-going                 Plan - 01/27/20 1158    Clinical Impression Statement Patient reports a recent fall, she reports that she tripped over something, I questioned her as she has had another fall in the past 4-6 months, she reports that she just in careless.  She report sthat she has increased left shoulder pain but denies hurting the left hip.  She is still very tender here, I tried Ionto today.    PT Next Visit Plan we may want to assess her balance next visit and incorporate some balance training    Consulted and Agree with Plan of Care Patient           Patient will benefit from skilled therapeutic intervention in order to improve the following deficits and impairments:  Abnormal gait,Improper body mechanics,Pain,Increased muscle spasms,Decreased mobility,Decreased activity tolerance,Decreased range of motion,Decreased strength,Impaired flexibility,Difficulty walking,Postural dysfunction  Visit Diagnosis: Difficulty in walking, not elsewhere  classified  Pain in left hip  Acute bilateral low back pain without sciatica  Balance problem  Muscle spasm of back  Pain in right hip  Stiffness of right hip, not elsewhere classified     Problem List Patient Active Problem List   Diagnosis Date Noted  . Other fatigue 03/05/2019  .  H/O total shoulder replacement, left 10/23/2018  . PUD (peptic ulcer disease) 03/21/2018  . Nausea and vomiting 01/11/2018  . Synovial cyst of lumbar facet joint 12/23/2017  . Osteoarthritis of right hip 04/24/2017  . Primary osteoarthritis of right hip 04/24/2017  . Complete rotator cuff tear 05/09/2016  . Brainstem stroke ([place]) 01/04/2015  . Dizziness and giddiness 01/04/2015  . Post concussion syndrome 01/04/2015  . Rotator cuff tear, right 11/21/2011    Sumner Boast., PT 01/27/2020, 12:03 PM  [place]. [place], [place], [address] Phone: [phone]   Fax:  [phone]  Name: Cheryl Cooper MRN: [id number] Date of Birth: [birth year]

## 2020-01-30 ENCOUNTER — Other Ambulatory Visit: Payer: Self-pay | Admitting: Family Medicine

## 2020-01-30 DIAGNOSIS — R7989 Other specified abnormal findings of blood chemistry: Secondary | ICD-10-CM

## 2020-01-30 NOTE — Progress Notes (Signed)
See lab notes - needs repeat LFTs at lab only visit this week.

## 2020-02-01 ENCOUNTER — Telehealth: Payer: Self-pay | Admitting: Family Medicine

## 2020-02-01 ENCOUNTER — Ambulatory Visit: Payer: Medicare PPO | Admitting: Physical Therapy

## 2020-02-01 NOTE — Telephone Encounter (Signed)
Reminder: put in orders for lab appointment tomorrow (12/15). Patient has to repeat labs per provider.

## 2020-02-02 ENCOUNTER — Ambulatory Visit: Payer: Medicare PPO | Admitting: Physical Therapy

## 2020-02-02 ENCOUNTER — Encounter: Payer: Self-pay | Admitting: Physical Therapy

## 2020-02-02 ENCOUNTER — Other Ambulatory Visit: Payer: Self-pay

## 2020-02-02 ENCOUNTER — Ambulatory Visit: Payer: Medicare PPO

## 2020-02-02 DIAGNOSIS — R2689 Other abnormalities of gait and mobility: Secondary | ICD-10-CM

## 2020-02-02 DIAGNOSIS — M25552 Pain in left hip: Secondary | ICD-10-CM

## 2020-02-02 DIAGNOSIS — R7989 Other specified abnormal findings of blood chemistry: Secondary | ICD-10-CM | POA: Diagnosis not present

## 2020-02-02 DIAGNOSIS — M545 Low back pain, unspecified: Secondary | ICD-10-CM | POA: Diagnosis not present

## 2020-02-02 DIAGNOSIS — M62830 Muscle spasm of back: Secondary | ICD-10-CM | POA: Diagnosis not present

## 2020-02-02 DIAGNOSIS — M25551 Pain in right hip: Secondary | ICD-10-CM | POA: Diagnosis not present

## 2020-02-02 DIAGNOSIS — R262 Difficulty in walking, not elsewhere classified: Secondary | ICD-10-CM

## 2020-02-02 DIAGNOSIS — M25651 Stiffness of right hip, not elsewhere classified: Secondary | ICD-10-CM | POA: Diagnosis not present

## 2020-02-02 NOTE — Therapy (Signed)
[place]. [place], [place], [address] Phone: [phone]   Fax:  [phone]  Physical Therapy Treatment  Patient Details  Name: Cheryl Cooper MRN: [id number] Date of Birth: 05-02-1940 Referring Provider (PT): Carlota Raspberry   Encounter Date: 02/02/2020   PT End of Session - 02/02/20 1008    Visit Number 3    Date for PT Re-Evaluation 03/22/20    Authorization Type [employer]    PT Start Time 0925    PT Stop Time 1007    PT Time Calculation (min) 42 min    Activity Tolerance Patient tolerated treatment well    Behavior During Therapy [facility] for tasks assessed/performed           Past Medical History:  Diagnosis Date  . Anxiety   . Carpal tunnel syndrome   . Deafness in left ear   . Depression   . Diabetes mellitus    diet controlled  . GERD (gastroesophageal reflux disease)   . Hypercholesteremia   . Hypertension   . Hypothyroid   . Memory loss    reports d/t concussion  . PONV (postoperative nausea and vomiting) yrs ago, none recent  . Post concussion syndrome   . Stroke [facility])    reports they were silent    Past Surgical History:  Procedure Laterality Date  . ABDOMINAL HYSTERECTOMY  1986  . BACK SURGERY  2010   lower  . BIOPSY  01/12/2018   Procedure: BIOPSY;  Surgeon: Rush Landmark Telford Nab., MD;  Location: [facility];  Service: Gastroenterology;;  . CHOLECYSTECTOMY    . ESOPHAGOGASTRODUODENOSCOPY (EGD) WITH PROPOFOL N/A 01/12/2018   Procedure: ESOPHAGOGASTRODUODENOSCOPY (EGD) WITH PROPOFOL;  Surgeon: Rush Landmark Telford Nab., MD;  [facility];  Service: Gastroenterology;  Laterality: N/A;  . left elobow surgery  2003  . left rotator cuff  2001  . LUMBAR LAMINECTOMY/DECOMPRESSION MICRODISCECTOMY Left 12/23/2017   Procedure: Laminectomy for facet/synovial cyst - left - Lumbar three-Lumbar four;  Surgeon: Earnie Larsson, MD;  Location: [place];  Service: Neurosurgery;  Laterality: Left;  . r foot  surgery  1995  . REVERSE SHOULDER ARTHROPLASTY Left 10/23/2018   Procedure: REVERSE TOTAL SHOULDER ARTHROPLASTY;  Surgeon: Netta Cedars, MD;  [facility];  Service: Orthopedics;  Laterality: Left;  interscalene block  . right hand surgery  2001  . right index finger surgery  2009  . SHOULDER OPEN ROTATOR CUFF REPAIR  11/21/2011   Procedure: ROTATOR CUFF REPAIR SHOULDER OPEN;  Surgeon: Johnn Hai, MD;  [facility];  Service: Orthopedics;  Laterality: Right;  with subacromial decompression  . SHOULDER OPEN ROTATOR CUFF REPAIR Right 05/09/2016   Procedure: Right shoulder mini open revision rotator cuff repair, subacromial decompression;  Surgeon: Susa Day, MD;  [facility];  Service: Orthopedics;  Laterality: Right;  . TOTAL HIP ARTHROPLASTY Right 04/24/2017   Procedure: RIGHT TOTAL HIP ARTHROPLASTY ANTERIOR APPROACH;  Surgeon: Rod Can, MD;  [facility];  Service: Orthopedics;  Laterality: Right;  Needs RNFA    There were no vitals filed for this visit.   Subjective Assessment - 02/02/20 0934    Subjective Patient reports that she is still very sore from her fall, she reports that the left SI and the left shoulder is very painful.    Currently in Pain? Yes    Pain Score 6     Pain Location Hip    Pain Orientation Left;Posterior    Pain Descriptors / Indicators Aching;Sore  Aggravating Factors  movements, walking and bending              [facility] PT Assessment - 02/02/20 0001      Standardized Balance Assessment   Standardized Balance Assessment Berg Balance Test;Timed Up and Go Test      Berg Balance Test   Sit to Stand Able to stand without using hands and stabilize independently    Standing Unsupported Able to stand safely 2 minutes    Sitting with Back Unsupported but Feet Supported on Floor or Stool Able to sit safely and securely 2 minutes    Stand to Sit Controls descent by using hands    Transfers Able to transfer safely, definite need of  hands    Standing Unsupported with Eyes Closed Able to stand 10 seconds with supervision    Standing Unsupported with Feet Together Able to place feet together independently and stand for 1 minute with supervision    From Standing, Reach Forward with Outstretched Arm Can reach confidently >25 cm (10")    From Standing Position, Pick up Object from [place] to pick up shoe safely and easily    From Standing Position, Turn to Look Behind Over each Shoulder Looks behind one side only/other side shows less weight shift    Turn 360 Degrees Able to turn 360 degrees safely one side only in 4 seconds or less    Standing Unsupported, Alternately Place Feet on Step/Stool Able to stand independently and complete 8 steps >20 seconds    Standing Unsupported, One Foot in Front Able to take small step independently and hold 30 seconds    Standing on One Leg Tries to lift leg/unable to hold 3 seconds but remains standing independently    Total Score 44      Timed Up and Go Test   Normal TUG (seconds) 15    TUG Comments no device                         [facility] Adult PT Treatment/Exercise - 02/02/20 0001      High Level Balance   High Level Balance Comments on airex reaching, eyes closed and head turns, stepping over objects front and side, on airex ball toss, walking ball toss      Lumbar Exercises: Aerobic   Recumbent Bike level 2 x 6 minutes      Lumbar Exercises: Seated   Other Seated Lumbar Exercises on sit fit pelvic mobility and stability with pelvic clocks, marches and kicks, tband scap stab all while on the sit fit for core activation    Other Seated Lumbar Exercises physioball in lap isometric abs                    PT Short Term Goals - 02/02/20 1011      PT SHORT TERM GOAL #1   Title independent with initial HEP    Status Partially Met             PT Long Term Goals - 01/27/20 1202      PT LONG TERM GOAL #1   Title indepednent with advanced HEP     Status On-going      PT LONG TERM GOAL #2   Title decrease pain 50%    Status On-going                 Plan - 02/02/20 1008    Clinical Impression Statement I assessed  her balance today, she has TUG time of 15 seconds and Berg balance score of 44/56, putting her at a higher risk for falls, she had difficulty stepping over objects and with any dynamic surface standing.  I added a lot of balance activities to work on this due to the 3 falls in the past year    PT Next Visit Plan continue to work on Teacher, music and Agree with Plan of Care Patient           Patient will benefit from skilled therapeutic intervention in order to improve the following deficits and impairments:  Abnormal gait,Improper body mechanics,Pain,Increased muscle spasms,Decreased mobility,Decreased activity tolerance,Decreased range of motion,Decreased strength,Impaired flexibility,Difficulty walking,Postural dysfunction  Visit Diagnosis: Difficulty in walking, not elsewhere classified  Pain in left hip  Acute bilateral low back pain without sciatica  Balance problem  Muscle spasm of back     Problem List Patient Active Problem List   Diagnosis Date Noted  . Other fatigue 03/05/2019  . H/O total shoulder replacement, left 10/23/2018  . PUD (peptic ulcer disease) 03/21/2018  . Nausea and vomiting 01/11/2018  . Synovial cyst of lumbar facet joint 12/23/2017  . Osteoarthritis of right hip 04/24/2017  . Primary osteoarthritis of right hip 04/24/2017  . Complete rotator cuff tear 05/09/2016  . Brainstem stroke ([place]) 01/04/2015  . Dizziness and giddiness 01/04/2015  . Post concussion syndrome 01/04/2015  . Rotator cuff tear, right 11/21/2011    Sumner Boast., PT 02/02/2020, 10:11 AM  [place]. [place], [place], [address] Phone: [phone]   Fax:  [phone]  Name: Cheryl Cooper MRN: [id number] Date of  Birth: [birth year]

## 2020-02-02 NOTE — Telephone Encounter (Signed)
Lab has already been drawn

## 2020-02-03 ENCOUNTER — Encounter: Payer: Self-pay | Admitting: Physical Therapy

## 2020-02-03 ENCOUNTER — Ambulatory Visit: Payer: Medicare PPO | Admitting: Physical Therapy

## 2020-02-03 DIAGNOSIS — R262 Difficulty in walking, not elsewhere classified: Secondary | ICD-10-CM

## 2020-02-03 DIAGNOSIS — R2689 Other abnormalities of gait and mobility: Secondary | ICD-10-CM

## 2020-02-03 DIAGNOSIS — M545 Low back pain, unspecified: Secondary | ICD-10-CM

## 2020-02-03 DIAGNOSIS — M62830 Muscle spasm of back: Secondary | ICD-10-CM | POA: Diagnosis not present

## 2020-02-03 DIAGNOSIS — M25552 Pain in left hip: Secondary | ICD-10-CM

## 2020-02-03 DIAGNOSIS — M25551 Pain in right hip: Secondary | ICD-10-CM

## 2020-02-03 DIAGNOSIS — M25651 Stiffness of right hip, not elsewhere classified: Secondary | ICD-10-CM | POA: Diagnosis not present

## 2020-02-03 LAB — HEPATIC FUNCTION PANEL
ALT: 138 IU/L — ABNORMAL HIGH (ref 0–32)
AST: 76 IU/L — ABNORMAL HIGH (ref 0–40)
Albumin: 3.9 g/dL (ref 3.7–4.7)
Alkaline Phosphatase: 64 IU/L (ref 44–121)
Bilirubin Total: 0.2 mg/dL (ref 0.0–1.2)
Bilirubin, Direct: <0.1 mg/dL (ref 0.00–0.40)
Total Protein: 6.7 g/dL (ref 6.0–8.5)

## 2020-02-03 NOTE — Therapy (Signed)
[place]. [place], [place], [address] Phone: [phone]   Fax:  [phone]  Physical Therapy Treatment  Patient Details  Name: Cheryl Cooper MRN: [id number] Date of Birth: 1940/09/25 Referring Provider (PT): Carlota Raspberry   Encounter Date: 02/03/2020   PT End of Session - 02/03/20 1609    Visit Number 4    Date for PT Re-Evaluation 03/22/20    Authorization Type [employer]    PT Start Time 1526    PT Stop Time 1620    PT Time Calculation (min) 54 min    Activity Tolerance Patient tolerated treatment well    Behavior During Therapy WFL for tasks assessed/performed           Past Medical History:  Diagnosis Date   Anxiety    Carpal tunnel syndrome    Deafness in left ear    Depression    Diabetes mellitus    diet controlled   GERD (gastroesophageal reflux disease)    Hypercholesteremia    Hypertension    Hypothyroid    Memory loss    reports d/t concussion   PONV (postoperative nausea and vomiting) yrs ago, none recent   Post concussion syndrome    Stroke [facility])    reports they were silent    Past Surgical History:  Procedure Laterality Date   [place]  2010   lower   BIOPSY  01/12/2018   Procedure: BIOPSY;  Surgeon: Irving Copas., MD;  Location: [facility];  Service: Gastroenterology;;   CHOLECYSTECTOMY     ESOPHAGOGASTRODUODENOSCOPY (EGD) WITH PROPOFOL N/A 01/12/2018   Procedure: ESOPHAGOGASTRODUODENOSCOPY (EGD) WITH PROPOFOL;  Surgeon: Irving Copas., MD;  Location: [facility];  Service: Gastroenterology;  Laterality: N/A;   left elobow surgery  2003   left rotator cuff  2001   LUMBAR LAMINECTOMY/DECOMPRESSION MICRODISCECTOMY Left 12/23/2017   Procedure: Laminectomy for facet/synovial cyst - left - Lumbar three-Lumbar four;  Surgeon: Earnie Larsson, MD;  Location: [place];  Service: Neurosurgery;  Laterality: Left;   r foot  surgery  1995   REVERSE SHOULDER ARTHROPLASTY Left 10/23/2018   Procedure: REVERSE TOTAL SHOULDER ARTHROPLASTY;  Surgeon: Netta Cedars, MD;  [facility];  Service: Orthopedics;  Laterality: Left;  interscalene block   right hand surgery  2001   right index finger surgery  2009   SHOULDER OPEN ROTATOR CUFF REPAIR  11/21/2011   Procedure: ROTATOR CUFF REPAIR SHOULDER OPEN;  Surgeon: Johnn Hai, MD;  [facility];  Service: Orthopedics;  Laterality: Right;  with subacromial decompression   SHOULDER OPEN ROTATOR CUFF REPAIR Right 05/09/2016   Procedure: Right shoulder mini open revision rotator cuff repair, subacromial decompression;  Surgeon: Susa Day, MD;  [facility];  Service: Orthopedics;  Laterality: Right;   TOTAL HIP ARTHROPLASTY Right 04/24/2017   Procedure: RIGHT TOTAL HIP ARTHROPLASTY ANTERIOR APPROACH;  Surgeon: Rod Can, MD;  [facility];  Service: Orthopedics;  Laterality: Right;  Needs RNFA    There were no vitals filed for this visit.   Subjective Assessment - 02/03/20 1531    Subjective Patient reports that she is doing a little better, less soreness, but still hurting, no falls    Currently in Pain? Yes    Pain Score 5     Pain Location Hip    Pain Orientation Left    Pain Descriptors / Indicators Sore    Aggravating Factors  walking  Pain Relieving Factors pain meds                             [facility] Adult PT Treatment/Exercise - 02/03/20 0001      High Level Balance   High Level Balance Comments on airex reaching, ball toss, on airex 6" toe touches, airex balance beam forward and side stepping      Lumbar Exercises: Stretches   Passive Hamstring Stretch Right;Left;4 reps;20 seconds    ITB Stretch Right;Left;3 reps;10 seconds    Piriformis Stretch Right;Left;3 reps;20 seconds      Lumbar Exercises: Aerobic   Nustep L5 x 6 min      Moist Heat Therapy   Number Minutes Moist Heat 12 Minutes    Moist Heat  Location Lumbar Spine      Electrical Stimulation   Electrical Stimulation Location Left SI area    Electrical Stimulation Action IFC    Electrical Stimulation Parameters sitting    Electrical Stimulation Goals Pain                    PT Short Term Goals - 02/02/20 1011      PT SHORT TERM GOAL #1   Title independent with initial HEP    Status Partially Met             PT Long Term Goals - 02/03/20 1612      PT LONG TERM GOAL #2   Title decrease pain 50%    Status On-going      PT LONG TERM GOAL #3   Title incresae hip strength to 4+/5    Status On-going                 Plan - 02/03/20 1610    Clinical Impression Statement I worked more on balance today, she did well with this, the airex dynamic surface activities are very difficult for her.  She had difficulty stepping over items, tends to take too small of a step, this causes difficulty for her.  Needs CGA to min A to correct balance.  She is very tender in the left SI area and has a large knot here.    PT Next Visit Plan continue to work on balance    Consulted and Agree with Plan of Care Patient           Patient will benefit from skilled therapeutic intervention in order to improve the following deficits and impairments:  Abnormal gait,Improper body mechanics,Pain,Increased muscle spasms,Decreased mobility,Decreased activity tolerance,Decreased range of motion,Decreased strength,Impaired flexibility,Difficulty walking,Postural dysfunction  Visit Diagnosis: Difficulty in walking, not elsewhere classified  Pain in left hip  Acute bilateral low back pain without sciatica  Balance problem  Muscle spasm of back  Pain in right hip     Problem List Patient Active Problem List   Diagnosis Date Noted   Other fatigue 03/05/2019   H/O total shoulder replacement, left 10/23/2018   PUD (peptic ulcer disease) 03/21/2018   Nausea and vomiting 01/11/2018   Synovial cyst of lumbar facet  joint 12/23/2017   Osteoarthritis of right hip 04/24/2017   Primary osteoarthritis of right hip 04/24/2017   Complete rotator cuff tear 05/09/2016   Brainstem stroke ([place]) 01/04/2015   Dizziness and giddiness 01/04/2015   Post concussion syndrome 01/04/2015   Rotator cuff tear, right 11/21/2011    Sumner Boast., PT 02/03/2020, 4:14 PM  [facility]  [address]. [place], [place], [address] Phone: [phone]   Fax:  [phone]  Name: Cheryl Cooper MRN: [id number] Date of Birth: 27-Jun-1940

## 2020-02-04 ENCOUNTER — Other Ambulatory Visit: Payer: Self-pay | Admitting: Family Medicine

## 2020-02-04 DIAGNOSIS — G4700 Insomnia, unspecified: Secondary | ICD-10-CM

## 2020-02-04 NOTE — Telephone Encounter (Signed)
Requested medication (s) are due for refill today -expired prescription  Requested medication (s) are on the active medication list -no  Future visit scheduled -yes  Last refill: 06/17/2017  Notes to clinic: Request RF of expired Rx- sent for review   Requested Prescriptions  Pending Prescriptions Disp Refills   hydrOXYzine (ATARAX/VISTARIL) 10 MG tablet [Pharmacy Med Name: hydrOXYzine HCL 10 MG TABLET] 30 tablet 0    Sig: TAKE ONE TABLET BY MOUTH AT BEDTIME AS NEEDED FOR ANXIETY AND SLEEP      Ear, Nose, and Throat:  Antihistamines Passed - 02/04/2020 11:42 AM      Passed - Valid encounter within last 12 months    Recent Outpatient Visits           2 weeks ago Hypothyroidism, unspecified type   [facility], Ranell Patrick, MD   2 weeks ago Type 2 diabetes, diet controlled [facility])   [facility], Ranell Patrick, MD   6 months ago Type 2 diabetes, diet controlled [facility])   [facility], Ranell Patrick, MD   11 months ago Other fatigue   [facility], Delfino Lovett, NP   1 year ago Essential hypertension   [facility], Ranell Patrick, MD       Future Appointments             In 5 months Carlota Raspberry Ranell Patrick, MD Primary Care at [place], [facility]                 Requested Prescriptions  Pending Prescriptions Disp Refills   hydrOXYzine (ATARAX/VISTARIL) 10 MG tablet [Pharmacy Med Name: hydrOXYzine HCL 10 MG TABLET] 30 tablet 0    Sig: TAKE ONE TABLET BY MOUTH AT BEDTIME AS NEEDED FOR ANXIETY AND SLEEP      Ear, Nose, and Throat:  Antihistamines Passed - 02/04/2020 11:42 AM      Passed - Valid encounter within last 12 months    Recent Outpatient Visits           2 weeks ago Hypothyroidism, unspecified type   [facility], Ranell Patrick, MD   2 weeks ago Type 2 diabetes, diet controlled [facility])   [facility], Ranell Patrick, MD   6 months ago Type 2 diabetes, diet controlled [facility])   [facility], Ranell Patrick, MD   11 months ago Other fatigue   [facility], Delfino Lovett, NP   1 year ago Essential hypertension   [facility], Ranell Patrick, MD       Future Appointments             In 5 months Carlota Raspberry Ranell Patrick, MD Primary Care at [place], [facility]

## 2020-02-07 NOTE — Telephone Encounter (Signed)
No refills at this time this Rx has not been filled in 1 1/2 yrs

## 2020-02-07 NOTE — Telephone Encounter (Signed)
Please schedule patient appt for med refills

## 2020-02-07 NOTE — Telephone Encounter (Signed)
patient denies requesting this medication / I had scheduled appt to get a refill but pt insisted that I cancel it

## 2020-02-07 NOTE — Telephone Encounter (Signed)
02/07/2020 - PATIENT REQUESTING A REFILL ON HER HYDROXYZINE HCL 10 mg. I TRIED TO SCHEDULE AN OFFICE VISIT WITH DR. Carlota Raspberry BUT HAD TO LEAVE A MESSAGE ON HER VOICE MAIL TO RETURN MY CALL. I WILL ROUTE BACK TO THE CLINICAL TEAM FOR REVIEW. [place]

## 2020-02-08 DIAGNOSIS — M25512 Pain in left shoulder: Secondary | ICD-10-CM | POA: Diagnosis not present

## 2020-02-09 ENCOUNTER — Encounter: Payer: Self-pay | Admitting: Physical Therapy

## 2020-02-09 ENCOUNTER — Other Ambulatory Visit: Payer: Self-pay

## 2020-02-09 ENCOUNTER — Ambulatory Visit: Payer: Medicare PPO | Admitting: Physical Therapy

## 2020-02-09 DIAGNOSIS — M62830 Muscle spasm of back: Secondary | ICD-10-CM | POA: Diagnosis not present

## 2020-02-09 DIAGNOSIS — R2689 Other abnormalities of gait and mobility: Secondary | ICD-10-CM

## 2020-02-09 DIAGNOSIS — M25552 Pain in left hip: Secondary | ICD-10-CM | POA: Diagnosis not present

## 2020-02-09 DIAGNOSIS — M25651 Stiffness of right hip, not elsewhere classified: Secondary | ICD-10-CM

## 2020-02-09 DIAGNOSIS — R262 Difficulty in walking, not elsewhere classified: Secondary | ICD-10-CM

## 2020-02-09 DIAGNOSIS — M25551 Pain in right hip: Secondary | ICD-10-CM | POA: Diagnosis not present

## 2020-02-09 DIAGNOSIS — M545 Low back pain, unspecified: Secondary | ICD-10-CM | POA: Diagnosis not present

## 2020-02-09 NOTE — Telephone Encounter (Signed)
Patient had lab work done recently. Patient wants to know if she needs to schedule an appointment with the provider to discuss the results. Please advise at [phone]

## 2020-02-09 NOTE — Telephone Encounter (Signed)
Pt asking for lab results from 12/3 lab work please advise

## 2020-02-09 NOTE — Therapy (Signed)
[address]. [place], [place], [address] Phone: [phone]   Fax:  [phone]  Physical Therapy Treatment  Patient Details  Name: Cheryl Cooper MRN: [id number] Date of Birth: [birth year] Referring Provider (PT): Carlota Raspberry   Encounter Date: 02/09/2020   PT End of Session - 02/09/20 1729    Visit Number 5    Date for PT Re-Evaluation 03/22/20    Authorization Type [employer]    PT Start Time 1612    PT Stop Time 6834    PT Time Calculation (min) 58 min    Activity Tolerance Patient tolerated treatment well    Behavior During Therapy WFL for tasks assessed/performed           Past Medical History:  Diagnosis Date   Anxiety    Carpal tunnel syndrome    Deafness in left ear    Depression    Diabetes mellitus    diet controlled   GERD (gastroesophageal reflux disease)    Hypercholesteremia    Hypertension    Hypothyroid    Memory loss    reports d/t concussion   PONV (postoperative nausea and vomiting) yrs ago, none recent   Post concussion syndrome    Stroke [facility])    reports they were silent    Past Surgical History:  Procedure Laterality Date   [place]  2010   lower   BIOPSY  01/12/2018   Procedure: BIOPSY;  Surgeon: Irving Copas., MD;  [facility];  Service: Gastroenterology;;   CHOLECYSTECTOMY     ESOPHAGOGASTRODUODENOSCOPY (EGD) WITH PROPOFOL N/A 01/12/2018   Procedure: ESOPHAGOGASTRODUODENOSCOPY (EGD) WITH PROPOFOL;  Surgeon: Irving Copas., MD;  [facility];  Service: Gastroenterology;  Laterality: N/A;   left elobow surgery  2003   left rotator cuff  2001   LUMBAR LAMINECTOMY/DECOMPRESSION MICRODISCECTOMY Left 12/23/2017   Procedure: Laminectomy for facet/synovial cyst - left - Lumbar three-Lumbar four;  Surgeon: Earnie Larsson, MD;  Location: [place];  Service: Neurosurgery;  Laterality: Left;   r foot  surgery  1995   REVERSE SHOULDER ARTHROPLASTY Left 10/23/2018   Procedure: REVERSE TOTAL SHOULDER ARTHROPLASTY;  Surgeon: Netta Cedars, MD;  [facility];  Service: Orthopedics;  Laterality: Left;  interscalene block   right hand surgery  2001   right index finger surgery  2009   SHOULDER OPEN ROTATOR CUFF REPAIR  11/21/2011   Procedure: ROTATOR CUFF REPAIR SHOULDER OPEN;  Surgeon: Johnn Hai, MD;  [facility];  Service: Orthopedics;  Laterality: Right;  with subacromial decompression   SHOULDER OPEN ROTATOR CUFF REPAIR Right 05/09/2016   Procedure: Right shoulder mini open revision rotator cuff repair, subacromial decompression;  Surgeon: Susa Day, MD;  [facility];  Service: Orthopedics;  Laterality: Right;   TOTAL HIP ARTHROPLASTY Right 04/24/2017   Procedure: RIGHT TOTAL HIP ARTHROPLASTY ANTERIOR APPROACH;  Surgeon: Rod Can, MD;  [facility];  Service: Orthopedics;  Laterality: Right;  Needs RNFA    There were no vitals filed for this visit.   Subjective Assessment - 02/09/20 1618    Subjective Patient reports that the left shoulder is hurting significantly at times, reports that she is unsure of what causes the pain    Currently in Pain? Yes    Pain Score 5     Pain Location Hip    Pain Orientation Left    Aggravating Factors  walking, standing  [facility] Adult PT Treatment/Exercise - 02/09/20 0001      High Level Balance   High Level Balance Comments on airex reaching, ball toss, on airex 6" toe touches, airex balance beam forward and side stepping, airex eyes closed      Lumbar Exercises: Stretches   Passive Hamstring Stretch Right;Left;4 reps;20 seconds    ITB Stretch Right;Left;3 reps;10 seconds    Piriformis Stretch Right;Left;3 reps;20 seconds      Lumbar Exercises: Aerobic   Nustep L5 x 6 min      Lumbar Exercises: Seated   Other Seated Lumbar Exercises on sit fit pelvic mobility and  stability with pelvic clocks, marches and kicks, tband scap stab all while on the sit fit for core activation    Other Seated Lumbar Exercises physioball in lap isometric abs      Moist Heat Therapy   Number Minutes Moist Heat 12 Minutes    Moist Heat Location Lumbar Spine      Electrical Stimulation   Electrical Stimulation Location Left SI area    Electrical Stimulation Action IFC    Electrical Stimulation Parameters supine    Electrical Stimulation Goals Pain                    PT Short Term Goals - 02/02/20 1011      PT SHORT TERM GOAL #1   Title independent with initial HEP    Status Partially Met             PT Long Term Goals - 02/09/20 1731      PT LONG TERM GOAL #1   Title indepednent with advanced HEP    Status On-going      PT LONG TERM GOAL #2   Title decrease pain 50%    Status On-going      PT LONG TERM GOAL #3   Title incresae hip strength to 4+/5    Status On-going      PT LONG TERM GOAL #4   Title report no difficulty getting up from sitting    Status On-going                 Plan - 02/09/20 1730    Clinical Impression Statement Patient with increased c/o painin the left shoulder from her fall a few weeks, ago, she reports that x-rays are negative but will have shooting pains wiht reaching and bending over in the left shoulder. She has some bruising that is still visible and is tender to the posterior shoulder area.    PT Treatment/Interventions ADLs/Self Care Home Management;Cryotherapy;Electrical Stimulation;Ultrasound;Functional mobility training;Stair training;Gait training;Therapeutic activities;Therapeutic exercise;Balance training;Patient/family education;Manual techniques;Dry needling    PT Next Visit Plan continue to work on balance           Patient will benefit from skilled therapeutic intervention in order to improve the following deficits and impairments:  Abnormal gait,Improper body mechanics,Pain,Increased muscle  spasms,Decreased mobility,Decreased activity tolerance,Decreased range of motion,Decreased strength,Impaired flexibility,Difficulty walking,Postural dysfunction  Visit Diagnosis: Difficulty in walking, not elsewhere classified  Pain in left hip  Acute bilateral low back pain without sciatica  Balance problem  Muscle spasm of back  Pain in right hip  Stiffness of right hip, not elsewhere classified     Problem List Patient Active Problem List   Diagnosis Date Noted   Other fatigue 03/05/2019   H/O total shoulder replacement, left 10/23/2018   PUD (peptic ulcer disease) 03/21/2018   Nausea and vomiting 01/11/2018   Synovial cyst  of lumbar facet joint 12/23/2017   Osteoarthritis of right hip 04/24/2017   Primary osteoarthritis of right hip 04/24/2017   Complete rotator cuff tear 05/09/2016   Brainstem stroke ([place]) 01/04/2015   Dizziness and giddiness 01/04/2015   Post concussion syndrome 01/04/2015   Rotator cuff tear, right 11/21/2011    Sumner Boast., PT 02/09/2020, 5:32 PM  [place]. [place], [place], [address] Phone: [phone]   Fax:  [phone]  Name: Cheryl Cooper MRN: [id number] Date of Birth: 1940/11/13

## 2020-02-10 NOTE — Telephone Encounter (Signed)
Note sent

## 2020-02-14 ENCOUNTER — Telehealth: Payer: Self-pay | Admitting: Family Medicine

## 2020-02-14 ENCOUNTER — Encounter: Payer: Self-pay | Admitting: Physical Therapy

## 2020-02-14 ENCOUNTER — Ambulatory Visit: Payer: Medicare PPO | Admitting: Physical Therapy

## 2020-02-14 ENCOUNTER — Other Ambulatory Visit: Payer: Self-pay

## 2020-02-14 DIAGNOSIS — M545 Low back pain, unspecified: Secondary | ICD-10-CM | POA: Diagnosis not present

## 2020-02-14 DIAGNOSIS — R2689 Other abnormalities of gait and mobility: Secondary | ICD-10-CM

## 2020-02-14 DIAGNOSIS — M25552 Pain in left hip: Secondary | ICD-10-CM | POA: Diagnosis not present

## 2020-02-14 DIAGNOSIS — R262 Difficulty in walking, not elsewhere classified: Secondary | ICD-10-CM | POA: Diagnosis not present

## 2020-02-14 DIAGNOSIS — M25551 Pain in right hip: Secondary | ICD-10-CM

## 2020-02-14 DIAGNOSIS — M62830 Muscle spasm of back: Secondary | ICD-10-CM

## 2020-02-14 DIAGNOSIS — M25651 Stiffness of right hip, not elsewhere classified: Secondary | ICD-10-CM | POA: Diagnosis not present

## 2020-02-14 NOTE — Telephone Encounter (Signed)
Pt called back and wants to know what her recent liver enzymes results came back with;    Please reach out to patient regarding these results

## 2020-02-14 NOTE — Therapy (Signed)
[place] [place], [place], [address] Phone: [phone]   Fax:  [phone]  Physical Therapy Treatment  Patient Details  Name: [place] MRN: [id number] Date of Birth: September 27, 1940 Referring Provider (PT): Carlota Raspberry   Encounter Date: 02/14/2020   PT End of Session - 02/14/20 1441    Visit Number 6    Date for PT Re-Evaluation 03/22/20    Authorization Type [employer]    PT Start Time 1400    PT Stop Time 1500    PT Time Calculation (min) 60 min    Activity Tolerance Patient tolerated treatment well    Behavior During Therapy [facility] for tasks assessed/performed           Past Medical History:  Diagnosis Date  . Anxiety   . Carpal tunnel syndrome   . Deafness in left ear   . Depression   . Diabetes mellitus    diet controlled  . GERD (gastroesophageal reflux disease)   . Hypercholesteremia   . Hypertension   . Hypothyroid   . Memory loss    reports d/t concussion  . PONV (postoperative nausea and vomiting) yrs ago, none recent  . Post concussion syndrome   . Stroke [facility])    reports they were silent    Past Surgical History:  Procedure Laterality Date  . ABDOMINAL HYSTERECTOMY  1986  . BACK SURGERY  2010   lower  . BIOPSY  01/12/2018   Procedure: BIOPSY;  Surgeon: Rush Landmark Telford Nab., MD;  Location: [facility];  Service: Gastroenterology;;  . CHOLECYSTECTOMY    . ESOPHAGOGASTRODUODENOSCOPY (EGD) WITH PROPOFOL N/A 01/12/2018   Procedure: ESOPHAGOGASTRODUODENOSCOPY (EGD) WITH PROPOFOL;  Surgeon: Rush Landmark Telford Nab., MD;  [facility];  Service: Gastroenterology;  Laterality: N/A;  . left elobow surgery  2003  . left rotator cuff  2001  . LUMBAR LAMINECTOMY/DECOMPRESSION MICRODISCECTOMY Left 12/23/2017   Procedure: Laminectomy for facet/synovial cyst - left - Lumbar three-Lumbar four;  Surgeon: Earnie Larsson, MD;  Location: [facility];  Service: Neurosurgery;  Laterality: Left;  . r foot  surgery  1995  . REVERSE SHOULDER ARTHROPLASTY Left 10/23/2018   Procedure: REVERSE TOTAL SHOULDER ARTHROPLASTY;  Surgeon: Netta Cedars, MD;  [facility];  Service: Orthopedics;  Laterality: Left;  interscalene block  . right hand surgery  2001  . right index finger surgery  2009  . SHOULDER OPEN ROTATOR CUFF REPAIR  11/21/2011   Procedure: ROTATOR CUFF REPAIR SHOULDER OPEN;  Surgeon: Johnn Hai, MD;  [facility];  Service: Orthopedics;  Laterality: Right;  with subacromial decompression  . SHOULDER OPEN ROTATOR CUFF REPAIR Right 05/09/2016   Procedure: Right shoulder mini open revision rotator cuff repair, subacromial decompression;  Surgeon: Susa Day, MD;  [facility];  Service: Orthopedics;  Laterality: Right;  . TOTAL HIP ARTHROPLASTY Right 04/24/2017   Procedure: RIGHT TOTAL HIP ARTHROPLASTY ANTERIOR APPROACH;  Surgeon: Rod Can, MD;  [facility];  Service: Orthopedics;  Laterality: Right;  Needs RNFA    There were no vitals filed for this visit.   Subjective Assessment - 02/14/20 1401    Subjective Not hurting as bas as I have been.    Currently in Pain? Yes    Pain Score 4     Pain Location Back    Pain Orientation Lower    Pain Descriptors / Indicators Sore;Spasm    Aggravating Factors  standing  [place] Adult PT Treatment/Exercise - 02/14/20 0001      High Level Balance   High Level Balance Comments stepping over objects forward and side to side, airex step on and off the airex, airex balance beam tandem and side stepping      Lumbar Exercises: Aerobic   Nustep L5 x 6 min      Lumbar Exercises: Standing   Other Standing Lumbar Exercises 3# hip marches, abduction and extension holding walker      Lumbar Exercises: Seated   Long Arc Quad on Chair Both;2 sets;10 reps    LAQ on Chair Weights (lbs) 3    Other Seated Lumbar Exercises on sit fit pelvic mobility and stability with pelvic clocks,  marches and kicks, tband scap stab all while on the sit fit for core activation    Other Seated Lumbar Exercises marching 3#      Lumbar Exercises: Supine   Other Supine Lumbar Exercises feet on ball K2C, trunk rotation, small bridges and isometric abs      Moist Heat Therapy   Number Minutes Moist Heat 10 Minutes    Moist Heat Location Lumbar Spine      Electrical Stimulation   Electrical Stimulation Location L/S area    Electrical Stimulation Action IFC    Electrical Stimulation Parameters supine    Electrical Stimulation Goals Pain                    PT Short Term Goals - 02/02/20 1011      PT SHORT TERM GOAL #1   Title independent with initial HEP    Status Partially Met             PT Long Term Goals - 02/09/20 1731      PT LONG TERM GOAL #1   Title indepednent with advanced HEP    Status On-going      PT LONG TERM GOAL #2   Title decrease pain 50%    Status On-going      PT LONG TERM GOAL #3   Title incresae hip strength to 4+/5    Status On-going      PT LONG TERM GOAL #4   Title report no difficulty getting up from sitting    Status On-going                 Plan - 02/14/20 1442    Clinical Impression Statement Patient reports that the shoulder and her back is a little better, still hurting, she does report fear of falls.  She has a lot of difficulty with the tandem walk and with dynamic surfaces, reports that she would like to do some balance activities at home    PT Next Visit Plan give HEP for balance    Consulted and Agree with Plan of Care Patient           Patient will benefit from skilled therapeutic intervention in order to improve the following deficits and impairments:  Abnormal gait,Improper body mechanics,Pain,Increased muscle spasms,Decreased mobility,Decreased activity tolerance,Decreased range of motion,Decreased strength,Impaired flexibility,Difficulty walking,Postural dysfunction  Visit Diagnosis: Difficulty in  walking, not elsewhere classified  Pain in left hip  Acute bilateral low back pain without sciatica  Balance problem  Muscle spasm of back  Pain in right hip     Problem List Patient Active Problem List   Diagnosis Date Noted  . Other fatigue 03/05/2019  . H/O total shoulder replacement, left 10/23/2018  . PUD (peptic ulcer disease)  03/21/2018  . Nausea and vomiting 01/11/2018  . Synovial cyst of lumbar facet joint 12/23/2017  . Osteoarthritis of right hip 04/24/2017  . Primary osteoarthritis of right hip 04/24/2017  . Complete rotator cuff tear 05/09/2016  . Brainstem stroke ([place]) 01/04/2015  . Dizziness and giddiness 01/04/2015  . Post concussion syndrome 01/04/2015  . Rotator cuff tear, right 11/21/2011    Sumner Boast., PT 02/14/2020, 2:48 PM  [place]. [place], [place], [address] Phone: [phone]   Fax:  [phone]  Name: DEMONICA FARREY MRN: [id number] Date of Birth: Jun 30, 1940

## 2020-02-14 NOTE — Telephone Encounter (Signed)
Called pt back and informed her

## 2020-02-16 ENCOUNTER — Other Ambulatory Visit: Payer: Self-pay

## 2020-02-16 ENCOUNTER — Ambulatory Visit: Payer: Medicare PPO | Admitting: Physical Therapy

## 2020-02-16 ENCOUNTER — Encounter: Payer: Self-pay | Admitting: Physical Therapy

## 2020-02-16 DIAGNOSIS — M545 Low back pain, unspecified: Secondary | ICD-10-CM | POA: Diagnosis not present

## 2020-02-16 DIAGNOSIS — R262 Difficulty in walking, not elsewhere classified: Secondary | ICD-10-CM | POA: Diagnosis not present

## 2020-02-16 DIAGNOSIS — R2689 Other abnormalities of gait and mobility: Secondary | ICD-10-CM | POA: Diagnosis not present

## 2020-02-16 DIAGNOSIS — M25651 Stiffness of right hip, not elsewhere classified: Secondary | ICD-10-CM | POA: Diagnosis not present

## 2020-02-16 DIAGNOSIS — M62830 Muscle spasm of back: Secondary | ICD-10-CM | POA: Diagnosis not present

## 2020-02-16 DIAGNOSIS — M25551 Pain in right hip: Secondary | ICD-10-CM | POA: Diagnosis not present

## 2020-02-16 DIAGNOSIS — M25552 Pain in left hip: Secondary | ICD-10-CM

## 2020-02-16 NOTE — Therapy (Signed)
[place]. [place], [place], [address] Phone: [phone]   Fax:  [phone]  Physical Therapy Treatment  Patient Details  Name: [place] MRN: [id number] Date of Birth: 11/13/1940 Referring Provider (PT): Carlota Raspberry   Encounter Date: 02/16/2020   PT End of Session - 02/16/20 1526    Visit Number 7    Date for PT Re-Evaluation 03/22/20    Authorization Type [employer]    PT Start Time 1440    PT Stop Time 1540    PT Time Calculation (min) 60 min    Activity Tolerance Patient tolerated treatment well    Behavior During Therapy [facility] for tasks assessed/performed           Past Medical History:  Diagnosis Date  . Anxiety   . Carpal tunnel syndrome   . Deafness in left ear   . Depression   . Diabetes mellitus    diet controlled  . GERD (gastroesophageal reflux disease)   . Hypercholesteremia   . Hypertension   . Hypothyroid   . Memory loss    reports d/t concussion  . PONV (postoperative nausea and vomiting) yrs ago, none recent  . Post concussion syndrome   . Stroke [facility])    reports they were silent    Past Surgical History:  Procedure Laterality Date  . ABDOMINAL HYSTERECTOMY  1986  . BACK SURGERY  2010   lower  . BIOPSY  01/12/2018   Procedure: BIOPSY;  Surgeon: Rush Landmark Telford Nab., MD;  Location: [facility];  Service: Gastroenterology;;  . CHOLECYSTECTOMY    . ESOPHAGOGASTRODUODENOSCOPY (EGD) WITH PROPOFOL N/A 01/12/2018   Procedure: ESOPHAGOGASTRODUODENOSCOPY (EGD) WITH PROPOFOL;  Surgeon: Rush Landmark Telford Nab., MD;  [facility];  Service: Gastroenterology;  Laterality: N/A;  . left elobow surgery  2003  . left rotator cuff  2001  . LUMBAR LAMINECTOMY/DECOMPRESSION MICRODISCECTOMY Left 12/23/2017   Procedure: Laminectomy for facet/synovial cyst - left - Lumbar three-Lumbar four;  Surgeon: Earnie Larsson, MD;  Location: [facility];  Service: Neurosurgery;  Laterality: Left;  . r foot  surgery  1995  . REVERSE SHOULDER ARTHROPLASTY Left 10/23/2018   Procedure: REVERSE TOTAL SHOULDER ARTHROPLASTY;  Surgeon: Netta Cedars, MD;  [facility];  Service: Orthopedics;  Laterality: Left;  interscalene block  . right hand surgery  2001  . right index finger surgery  2009  . SHOULDER OPEN ROTATOR CUFF REPAIR  11/21/2011   Procedure: ROTATOR CUFF REPAIR SHOULDER OPEN;  Surgeon: Johnn Hai, MD;  [facility];  Service: Orthopedics;  Laterality: Right;  with subacromial decompression  . SHOULDER OPEN ROTATOR CUFF REPAIR Right 05/09/2016   Procedure: Right shoulder mini open revision rotator cuff repair, subacromial decompression;  Surgeon: Susa Day, MD;  [facility];  Service: Orthopedics;  Laterality: Right;  . TOTAL HIP ARTHROPLASTY Right 04/24/2017   Procedure: RIGHT TOTAL HIP ARTHROPLASTY ANTERIOR APPROACH;  Surgeon: Rod Can, MD;  [facility];  Service: Orthopedics;  Laterality: Right;  Needs RNFA    There were no vitals filed for this visit.   Subjective Assessment - 02/16/20 1451    Subjective Just sore and tired, have had company at my house.  Low back still hurting, hip and shoulder are feeling better    Currently in Pain? Yes    Pain Score 4     Pain Location Back    Pain Orientation Lower    Pain Descriptors / Indicators Sore;Tiring  [place] Adult PT Treatment/Exercise - 02/16/20 0001      High Level Balance   High Level Balance Comments stepping over objects forward and side to side, airex step on and off the airex, airex balance beam tandem and side stepping on airex ball tosss, head turns eyes closed, tandem walking, direction changes with HHA      Lumbar Exercises: Stretches   Passive Hamstring Stretch Right;Left;4 reps;20 seconds    Lower Trunk Rotation 5 reps;10 seconds    ITB Stretch Right;Left;3 reps;10 seconds    Piriformis Stretch Right;Left;3 reps;20 seconds      Lumbar Exercises:  Aerobic   Nustep L5 x 6 min      Lumbar Exercises: Machines for Strengthening   Cybex Knee Extension 5# 2x10    Cybex Knee Flexion 20# 2x10      Lumbar Exercises: Standing   Other Standing Lumbar Exercises 3# hip marches, abduction and extension holding walker      Lumbar Exercises: Supine   Other Supine Lumbar Exercises feet on ball K2C, trunk rotation, small bridges and isometric abs      Moist Heat Therapy   Number Minutes Moist Heat 10 Minutes    Moist Heat Location Lumbar Spine      Electrical Stimulation   Electrical Stimulation Location L/S area    Electrical Stimulation Action IFC    Electrical Stimulation Parameters supine    Electrical Stimulation Goals Pain                    PT Short Term Goals - 02/02/20 1011      PT SHORT TERM GOAL #1   Title independent with initial HEP    Status Partially Met             PT Long Term Goals - 02/16/20 1541      PT LONG TERM GOAL #2   Title decrease pain 50%    Status Partially Met      PT LONG TERM GOAL #3   Title incresae hip strength to 4+/5    Status Partially Met                 Plan - 02/16/20 1533    Clinical Impression Statement Patient reports overall feeling better, she is having less shoulder and hip pain,  She reports that she is more worried about her balance, I gave HEP for blance today and she felt like she could do it, I did caution her on safety    PT Next Visit Plan see how the balance HEP went    Consulted and Agree with Plan of Care Patient           Patient will benefit from skilled therapeutic intervention in order to improve the following deficits and impairments:  Abnormal gait,Improper body mechanics,Pain,Increased muscle spasms,Decreased mobility,Decreased activity tolerance,Decreased range of motion,Decreased strength,Impaired flexibility,Difficulty walking,Postural dysfunction  Visit Diagnosis: Difficulty in walking, not elsewhere classified  Pain in left  hip  Acute bilateral low back pain without sciatica  Balance problem  Muscle spasm of back     Problem List Patient Active Problem List   Diagnosis Date Noted  . Other fatigue 03/05/2019  . H/O total shoulder replacement, left 10/23/2018  . PUD (peptic ulcer disease) 03/21/2018  . Nausea and vomiting 01/11/2018  . Synovial cyst of lumbar facet joint 12/23/2017  . Osteoarthritis of right hip 04/24/2017  . Primary osteoarthritis of right hip 04/24/2017  . Complete rotator cuff tear 05/09/2016  .  Brainstem stroke ([place]) 01/04/2015  . Dizziness and giddiness 01/04/2015  . Post concussion syndrome 01/04/2015  . Rotator cuff tear, right 11/21/2011    Sumner Boast., PT 02/16/2020, 3:42 PM  [place]. [place], [place], [address] Phone: [phone]   Fax:  [phone]  Name: NAYELY DINGUS MRN: [id number] Date of Birth: [birth year]

## 2020-02-21 ENCOUNTER — Ambulatory Visit: Payer: Medicare PPO | Attending: Neurosurgery | Admitting: Physical Therapy

## 2020-02-21 ENCOUNTER — Ambulatory Visit: Payer: Medicare PPO

## 2020-02-21 ENCOUNTER — Encounter: Payer: Self-pay | Admitting: Physical Therapy

## 2020-02-21 DIAGNOSIS — R262 Difficulty in walking, not elsewhere classified: Secondary | ICD-10-CM | POA: Insufficient documentation

## 2020-02-21 DIAGNOSIS — M25552 Pain in left hip: Secondary | ICD-10-CM | POA: Insufficient documentation

## 2020-02-21 DIAGNOSIS — M62830 Muscle spasm of back: Secondary | ICD-10-CM | POA: Insufficient documentation

## 2020-02-21 DIAGNOSIS — M545 Low back pain, unspecified: Secondary | ICD-10-CM | POA: Diagnosis not present

## 2020-02-21 DIAGNOSIS — R2689 Other abnormalities of gait and mobility: Secondary | ICD-10-CM | POA: Diagnosis not present

## 2020-02-21 NOTE — Therapy (Signed)
[place]. [place], [place], [address] Phone: [phone]   Fax:  [phone]  Physical Therapy Treatment  Patient Details  Name: [place] MRN: [id number] Date of Birth: 1940-08-08 Referring Provider (PT): Carlota Raspberry   Encounter Date: 02/21/2020   PT End of Session - 02/21/20 1950    Visit Number 8    Number of Visits 12    Date for PT Re-Evaluation 03/22/20    Authorization Type [employer]    PT Start Time 9326    PT Stop Time 1530    PT Time Calculation (min) 47 min    Activity Tolerance Patient tolerated treatment well    Behavior During Therapy [facility] for tasks assessed/performed           Past Medical History:  Diagnosis Date  . Anxiety   . Carpal tunnel syndrome   . Deafness in left ear   . Depression   . Diabetes mellitus    diet controlled  . GERD (gastroesophageal reflux disease)   . Hypercholesteremia   . Hypertension   . Hypothyroid   . Memory loss    reports d/t concussion  . PONV (postoperative nausea and vomiting) yrs ago, none recent  . Post concussion syndrome   . Stroke [facility])    reports they were silent    Past Surgical History:  Procedure Laterality Date  . ABDOMINAL HYSTERECTOMY  1986  . BACK SURGERY  2010   lower  . BIOPSY  01/12/2018   Procedure: BIOPSY;  Surgeon: Rush Landmark Telford Nab., MD;  Location: [facility];  Service: Gastroenterology;;  . CHOLECYSTECTOMY    . ESOPHAGOGASTRODUODENOSCOPY (EGD) WITH PROPOFOL N/A 01/12/2018   Procedure: ESOPHAGOGASTRODUODENOSCOPY (EGD) WITH PROPOFOL;  Surgeon: Rush Landmark Telford Nab., MD;  [facility];  Service: Gastroenterology;  Laterality: N/A;  . left elobow surgery  2003  . left rotator cuff  2001  . LUMBAR LAMINECTOMY/DECOMPRESSION MICRODISCECTOMY Left 12/23/2017   Procedure: Laminectomy for facet/synovial cyst - left - Lumbar three-Lumbar four;  Surgeon: Earnie Larsson, MD;  Location: [place];  Service: Neurosurgery;   Laterality: Left;  . r foot surgery  1995  . REVERSE SHOULDER ARTHROPLASTY Left 10/23/2018   Procedure: REVERSE TOTAL SHOULDER ARTHROPLASTY;  Surgeon: Netta Cedars, MD;  [facility];  Service: Orthopedics;  Laterality: Left;  interscalene block  . right hand surgery  2001  . right index finger surgery  2009  . SHOULDER OPEN ROTATOR CUFF REPAIR  11/21/2011   Procedure: ROTATOR CUFF REPAIR SHOULDER OPEN;  Surgeon: Johnn Hai, MD;  [facility];  Service: Orthopedics;  Laterality: Right;  with subacromial decompression  . SHOULDER OPEN ROTATOR CUFF REPAIR Right 05/09/2016   Procedure: Right shoulder mini open revision rotator cuff repair, subacromial decompression;  Surgeon: Susa Day, MD;  [facility];  Service: Orthopedics;  Laterality: Right;  . TOTAL HIP ARTHROPLASTY Right 04/24/2017   Procedure: RIGHT TOTAL HIP ARTHROPLASTY ANTERIOR APPROACH;  Surgeon: Rod Can, MD;  [facility];  Service: Orthopedics;  Laterality: Right;  Needs RNFA    There were no vitals filed for this visit.   Subjective Assessment - 02/21/20 1514    Subjective Patient reports that she has been taking down and putting up all of her Christmas decorations, she reports that she is in a lot more pain in the low back    Currently in Pain? Yes    Pain Score 8     Pain Location Back  Pain Orientation Lower;Left    Pain Descriptors / Indicators Sore;Aching    Aggravating Factors  taking down decorations                             [facility] Adult PT Treatment/Exercise - 02/21/20 0001      Moist Heat Therapy   Number Minutes Moist Heat 10 Minutes    Moist Heat Location Lumbar Spine      Electrical Stimulation   Electrical Stimulation Location L/S area    Electrical Stimulation Action IFC    Electrical Stimulation Parameters supine    Electrical Stimulation Goals Pain      Manual Therapy   Manual Therapy Soft tissue mobilization    Manual therapy comments PROM  stretching of the LE's ITB, piriformis and HS    Soft tissue mobilization use of vibration to the left buttock and left lumbar area and the right side as well                    PT Short Term Goals - 02/02/20 1011      PT SHORT TERM GOAL #1   Title independent with initial HEP    Status Partially Met             PT Long Term Goals - 02/21/20 1519      PT LONG TERM GOAL #1   Title indepednent with advanced HEP    Status On-going      PT LONG TERM GOAL #2   Title decrease pain 50%    Status Partially Met                 Plan - 02/21/20 1517    Clinical Impression Statement Patient reports that she is in much more pain, she was not sure why, but with questioning she reports that she has been taking down and poutting up her Christmas, she is very tender and sore in the lumbar and buttock area, c/o more on the left    PT Next Visit Plan see if the pain is better    Consulted and Agree with Plan of Care Patient           Patient will benefit from skilled therapeutic intervention in order to improve the following deficits and impairments:  Abnormal gait,Improper body mechanics,Pain,Increased muscle spasms,Decreased mobility,Decreased activity tolerance,Decreased range of motion,Decreased strength,Impaired flexibility,Difficulty walking,Postural dysfunction  Visit Diagnosis: Difficulty in walking, not elsewhere classified  Pain in left hip  Acute bilateral low back pain without sciatica  Balance problem  Muscle spasm of back     Problem List Patient Active Problem List   Diagnosis Date Noted  . Other fatigue 03/05/2019  . H/O total shoulder replacement, left 10/23/2018  . PUD (peptic ulcer disease) 03/21/2018  . Nausea and vomiting 01/11/2018  . Synovial cyst of lumbar facet joint 12/23/2017  . Osteoarthritis of right hip 04/24/2017  . Primary osteoarthritis of right hip 04/24/2017  . Complete rotator cuff tear 05/09/2016  . Brainstem stroke  ([place]) 01/04/2015  . Dizziness and giddiness 01/04/2015  . Post concussion syndrome 01/04/2015  . Rotator cuff tear, right 11/21/2011    Sumner Boast., PT 02/21/2020, 3:21 PM  [place]. [place], [place], [address] Phone: [phone]   Fax:  [phone]  Name: [place] MRN: [id number] Date of Birth: 03/12/1940

## 2020-02-23 ENCOUNTER — Ambulatory Visit: Payer: Medicare PPO | Admitting: Physical Therapy

## 2020-02-23 ENCOUNTER — Encounter: Payer: Self-pay | Admitting: Physical Therapy

## 2020-02-23 ENCOUNTER — Other Ambulatory Visit: Payer: Self-pay

## 2020-02-23 DIAGNOSIS — M25552 Pain in left hip: Secondary | ICD-10-CM

## 2020-02-23 DIAGNOSIS — M62830 Muscle spasm of back: Secondary | ICD-10-CM | POA: Diagnosis not present

## 2020-02-23 DIAGNOSIS — M545 Low back pain, unspecified: Secondary | ICD-10-CM

## 2020-02-23 DIAGNOSIS — R2689 Other abnormalities of gait and mobility: Secondary | ICD-10-CM | POA: Diagnosis not present

## 2020-02-23 DIAGNOSIS — R262 Difficulty in walking, not elsewhere classified: Secondary | ICD-10-CM

## 2020-02-23 NOTE — Therapy (Signed)
[place] [place], [place], [address] Phone: [phone]   Fax:  [phone]  Physical Therapy Treatment  Patient Details  Name: Cheryl Cooper MRN: [id number] Date of Birth: Feb 14, 1941 Referring Provider (PT): Carlota Raspberry   Encounter Date: 02/23/2020   PT End of Session - 02/23/20 1510    Visit Number 9    Number of Visits 12    Date for PT Re-Evaluation 03/22/20    Authorization Type [employer]    PT Start Time 1430    PT Stop Time 1521    PT Time Calculation (min) 51 min    Activity Tolerance Patient tolerated treatment well    Behavior During Therapy [facility] for tasks assessed/performed           Past Medical History:  Diagnosis Date  . Anxiety   . Carpal tunnel syndrome   . Deafness in left ear   . Depression   . Diabetes mellitus    diet controlled  . GERD (gastroesophageal reflux disease)   . Hypercholesteremia   . Hypertension   . Hypothyroid   . Memory loss    reports d/t concussion  . PONV (postoperative nausea and vomiting) yrs ago, none recent  . Post concussion syndrome   . Stroke [facility])    reports they were silent    Past Surgical History:  Procedure Laterality Date  . ABDOMINAL HYSTERECTOMY  1986  . BACK SURGERY  2010   lower  . BIOPSY  01/12/2018   Procedure: BIOPSY;  Surgeon: Rush Landmark Telford Nab., MD;  Location: [facility];  Service: Gastroenterology;;  . CHOLECYSTECTOMY    . ESOPHAGOGASTRODUODENOSCOPY (EGD) WITH PROPOFOL N/A 01/12/2018   Procedure: ESOPHAGOGASTRODUODENOSCOPY (EGD) WITH PROPOFOL;  Surgeon: Rush Landmark Telford Nab., MD;  Location: [facility];  Service: Gastroenterology;  Laterality: N/A;  . left elobow surgery  2003  . left rotator cuff  2001  . LUMBAR LAMINECTOMY/DECOMPRESSION MICRODISCECTOMY Left 12/23/2017   Procedure: Laminectomy for facet/synovial cyst - left - Lumbar three-Lumbar four;  Surgeon: Earnie Larsson, MD;  Location: [place];  Service: Neurosurgery;   Laterality: Left;  . r foot surgery  1995  . REVERSE SHOULDER ARTHROPLASTY Left 10/23/2018   Procedure: REVERSE TOTAL SHOULDER ARTHROPLASTY;  Surgeon: Netta Cedars, MD;  Location: [facility];  Service: Orthopedics;  Laterality: Left;  interscalene block  . right hand surgery  2001  . right index finger surgery  2009  . SHOULDER OPEN ROTATOR CUFF REPAIR  11/21/2011   Procedure: ROTATOR CUFF REPAIR SHOULDER OPEN;  Surgeon: Johnn Hai, MD;  Location: [facility];  Service: Orthopedics;  Laterality: Right;  with subacromial decompression  . SHOULDER OPEN ROTATOR CUFF REPAIR Right 05/09/2016   Procedure: Right shoulder mini open revision rotator cuff repair, subacromial decompression;  Surgeon: Susa Day, MD;  Location: [facility];  Service: Orthopedics;  Laterality: Right;  . TOTAL HIP ARTHROPLASTY Right 04/24/2017   Procedure: RIGHT TOTAL HIP ARTHROPLASTY ANTERIOR APPROACH;  Surgeon: Rod Can, MD;  Location[facility];  Service: Orthopedics;  Laterality: Right;  Needs RNFA    There were no vitals filed for this visit.   Subjective Assessment - 02/23/20 1430    Subjective Pt reports that he back is doing good today.    Currently in Pain? Yes    Pain Score 4     Pain Location Back  [facility] Adult PT Treatment/Exercise - 02/23/20 0001      Lumbar Exercises: Stretches   Passive Hamstring Stretch Right;Left;4 reps;20 seconds    Lower Trunk Rotation 5 reps;10 seconds    ITB Stretch Right;Left;3 reps;10 seconds    Piriformis Stretch Right;Left;3 reps;20 seconds      Lumbar Exercises: Aerobic   Recumbent Bike level 2 x 6 minutes      Lumbar Exercises: Machines for Strengthening   Cybex Knee Extension 5# 2x10    Cybex Knee Flexion 20# 2x10      Lumbar Exercises: Standing   Other Standing Lumbar Exercises 3# hip marches, abduction and extension holding walker      Lumbar Exercises: Seated   Other Seated Lumbar Exercises marching 3#      Lumbar  Exercises: Supine   Other Supine Lumbar Exercises feet on ball K2C, trunk rotation, small bridges and isometric abs      Moist Heat Therapy   Number Minutes Moist Heat 10 Minutes    Moist Heat Location Lumbar Spine      Electrical Stimulation   Electrical Stimulation Location L/S area    Electrical Stimulation Action IFC    Electrical Stimulation Parameters supine    Electrical Stimulation Goals Pain                    PT Short Term Goals - 02/02/20 1011      PT SHORT TERM GOAL #1   Title independent with initial HEP    Status Partially Met             PT Long Term Goals - 02/21/20 1519      PT LONG TERM GOAL #1   Title indepednent with advanced HEP    Status On-going      PT LONG TERM GOAL #2   Title decrease pain 50%    Status Partially Met                 Plan - 02/23/20 1512    Clinical Impression Statement less pain today compared to last treatment. Introduced her back to a more active treatment session. No reports of increase pain, Cues during LE curls and extensions to complete the full ROM. Bilateral HS tightness noted with passive stretching. Some postural leaning noted with standing hip interventions. Pt responded well to modality.    Personal Factors and Comorbidities Comorbidity 3+    Comorbidities DM, HTN, CVA, right THA, left TSA, depression    Examination-Activity Limitations Bathing;Bed Mobility;Stairs;Locomotion Level;Stand;Transfers;Sit;Carry;Lift    Examination-Participation Restrictions Laundry;Shop;Yard Work;Cleaning    Stability/Clinical Decision Making Evolving/Moderate complexity    Rehab Potential Good    PT Frequency 2x / week    PT Duration 6 weeks    PT Treatment/Interventions ADLs/Self Care Home Management;Cryotherapy;Electrical Stimulation;Ultrasound;Functional mobility training;Stair training;Gait training;Therapeutic activities;Therapeutic exercise;Balance training;Patient/family education;Manual techniques;Dry needling     PT Next Visit Plan assess and progress with hip and core strenght           Patient will benefit from skilled therapeutic intervention in order to improve the following deficits and impairments:  Abnormal gait,Improper body mechanics,Pain,Increased muscle spasms,Decreased mobility,Decreased activity tolerance,Decreased range of motion,Decreased strength,Impaired flexibility,Difficulty walking,Postural dysfunction  Visit Diagnosis: Difficulty in walking, not elsewhere classified  Pain in left hip  Acute bilateral low back pain without sciatica     Problem List Patient Active Problem List   Diagnosis Date Noted  . Other fatigue 03/05/2019  . H/O total shoulder replacement, left 10/23/2018  . PUD (  peptic ulcer disease) 03/21/2018  . Nausea and vomiting 01/11/2018  . Synovial cyst of lumbar facet joint 12/23/2017  . Osteoarthritis of right hip 04/24/2017  . Primary osteoarthritis of right hip 04/24/2017  . Complete rotator cuff tear 05/09/2016  . Brainstem stroke ([place]) 01/04/2015  . Dizziness and giddiness 01/04/2015  . Post concussion syndrome 01/04/2015  . Rotator cuff tear, right 11/21/2011    Scot Jun, PTA 02/23/2020, 3:16 PM  [place]. [place], [place], [address] Phone: [phone]   Fax:  [phone]  Name: EVVIE BEHRMANN MRN: [id number] Date of Birth: 1940-11-20

## 2020-02-24 ENCOUNTER — Ambulatory Visit (INDEPENDENT_AMBULATORY_CARE_PROVIDER_SITE_OTHER): Payer: Medicare PPO | Admitting: Family Medicine

## 2020-02-24 DIAGNOSIS — R7989 Other specified abnormal findings of blood chemistry: Secondary | ICD-10-CM | POA: Diagnosis not present

## 2020-02-25 LAB — HEPATIC FUNCTION PANEL
ALT: 60 IU/L — ABNORMAL HIGH (ref 0–32)
AST: 53 IU/L — ABNORMAL HIGH (ref 0–40)
Albumin: 4.1 g/dL (ref 3.7–4.7)
Alkaline Phosphatase: 47 IU/L (ref 44–121)
Bilirubin Total: 0.2 mg/dL (ref 0.0–1.2)
Bilirubin, Direct: 0.11 mg/dL (ref 0.00–0.40)
Total Protein: 7.1 g/dL (ref 6.0–8.5)

## 2020-02-28 ENCOUNTER — Ambulatory Visit: Payer: Medicare PPO | Admitting: Physical Therapy

## 2020-02-28 DIAGNOSIS — H0100B Unspecified blepharitis left eye, upper and lower eyelids: Secondary | ICD-10-CM | POA: Diagnosis not present

## 2020-02-28 DIAGNOSIS — H52223 Regular astigmatism, bilateral: Secondary | ICD-10-CM | POA: Diagnosis not present

## 2020-02-28 DIAGNOSIS — H04123 Dry eye syndrome of bilateral lacrimal glands: Secondary | ICD-10-CM | POA: Diagnosis not present

## 2020-02-28 DIAGNOSIS — H2513 Age-related nuclear cataract, bilateral: Secondary | ICD-10-CM | POA: Diagnosis not present

## 2020-02-28 DIAGNOSIS — H11003 Unspecified pterygium of eye, bilateral: Secondary | ICD-10-CM | POA: Diagnosis not present

## 2020-02-28 DIAGNOSIS — H0100A Unspecified blepharitis right eye, upper and lower eyelids: Secondary | ICD-10-CM | POA: Diagnosis not present

## 2020-02-28 DIAGNOSIS — H5203 Hypermetropia, bilateral: Secondary | ICD-10-CM | POA: Diagnosis not present

## 2020-02-28 DIAGNOSIS — E119 Type 2 diabetes mellitus without complications: Secondary | ICD-10-CM | POA: Diagnosis not present

## 2020-02-28 DIAGNOSIS — H43393 Other vitreous opacities, bilateral: Secondary | ICD-10-CM | POA: Diagnosis not present

## 2020-02-29 DIAGNOSIS — M7062 Trochanteric bursitis, left hip: Secondary | ICD-10-CM | POA: Diagnosis not present

## 2020-02-29 DIAGNOSIS — M7632 Iliotibial band syndrome, left leg: Secondary | ICD-10-CM | POA: Diagnosis not present

## 2020-03-01 ENCOUNTER — Encounter: Payer: Self-pay | Admitting: Physical Therapy

## 2020-03-01 ENCOUNTER — Ambulatory Visit: Payer: Medicare PPO | Admitting: Family Medicine

## 2020-03-01 ENCOUNTER — Other Ambulatory Visit: Payer: Self-pay

## 2020-03-01 ENCOUNTER — Ambulatory Visit: Payer: Medicare PPO | Admitting: Physical Therapy

## 2020-03-01 DIAGNOSIS — M62830 Muscle spasm of back: Secondary | ICD-10-CM | POA: Diagnosis not present

## 2020-03-01 DIAGNOSIS — R262 Difficulty in walking, not elsewhere classified: Secondary | ICD-10-CM

## 2020-03-01 DIAGNOSIS — M545 Low back pain, unspecified: Secondary | ICD-10-CM | POA: Diagnosis not present

## 2020-03-01 DIAGNOSIS — R2689 Other abnormalities of gait and mobility: Secondary | ICD-10-CM | POA: Diagnosis not present

## 2020-03-01 DIAGNOSIS — M25552 Pain in left hip: Secondary | ICD-10-CM

## 2020-03-01 NOTE — Therapy (Addendum)
[place] [place], [place], [address] Phone: [phone]   Fax:  [phone]  Physical Therapy Treatment  Patient Details  Name: Cheryl Cooper MRN: [id number] Date of Birth: 08-03-1940 Referring Provider (PT): Carlota Raspberry   Encounter Date: 03/01/2020   PT End of Session - 03/01/20 1520    Visit Number 10    Number of Visits 12    Date for PT Re-Evaluation 03/07/19    Authorization Type [employer]    PT Start Time 3557    PT Stop Time 1533    PT Time Calculation (min) 48 min    Activity Tolerance Patient tolerated treatment well    Behavior During Therapy [facility] for tasks assessed/performed           Past Medical History:  Diagnosis Date  . Anxiety   . Carpal tunnel syndrome   . Deafness in left ear   . Depression   . Diabetes mellitus    diet controlled  . GERD (gastroesophageal reflux disease)   . Hypercholesteremia   . Hypertension   . Hypothyroid   . Memory loss    reports d/t concussion  . PONV (postoperative nausea and vomiting) yrs ago, none recent  . Post concussion syndrome   . Stroke [facility])    reports they were silent    Past Surgical History:  Procedure Laterality Date  . ABDOMINAL HYSTERECTOMY  1986  . BACK SURGERY  2010   lower  . BIOPSY  01/12/2018   Procedure: BIOPSY;  Surgeon: Rush Landmark Telford Nab., MD;  Location: [facility];  Service: Gastroenterology;;  . CHOLECYSTECTOMY    . ESOPHAGOGASTRODUODENOSCOPY (EGD) WITH PROPOFOL N/A 01/12/2018   Procedure: ESOPHAGOGASTRODUODENOSCOPY (EGD) WITH PROPOFOL;  Surgeon: Rush Landmark Telford Nab., MD;  [facility];  Service: Gastroenterology;  Laterality: N/A;  . left elobow surgery  2003  . left rotator cuff  2001  . LUMBAR LAMINECTOMY/DECOMPRESSION MICRODISCECTOMY Left 12/23/2017   Procedure: Laminectomy for facet/synovial cyst - left - Lumbar three-Lumbar four;  Surgeon: Earnie Larsson, MD;  Location: [place];  Service: Neurosurgery;   Laterality: Left;  . r foot surgery  1995  . REVERSE SHOULDER ARTHROPLASTY Left 10/23/2018   Procedure: REVERSE TOTAL SHOULDER ARTHROPLASTY;  Surgeon: Netta Cedars, MD;  [facility];  Service: Orthopedics;  Laterality: Left;  interscalene block  . right hand surgery  2001  . right index finger surgery  2009  . SHOULDER OPEN ROTATOR CUFF REPAIR  11/21/2011   Procedure: ROTATOR CUFF REPAIR SHOULDER OPEN;  Surgeon: Johnn Hai, MD;  [facility];  Service: Orthopedics;  Laterality: Right;  with subacromial decompression  . SHOULDER OPEN ROTATOR CUFF REPAIR Right 05/09/2016   Procedure: Right shoulder mini open revision rotator cuff repair, subacromial decompression;  Surgeon: Susa Day, MD;  [facility];  Service: Orthopedics;  Laterality: Right;  . TOTAL HIP ARTHROPLASTY Right 04/24/2017   Procedure: RIGHT TOTAL HIP ARTHROPLASTY ANTERIOR APPROACH;  Surgeon: Rod Can, MD;  [facility];  Service: Orthopedics;  Laterality: Right;  Needs RNFA    There were no vitals filed for this visit.   Subjective Assessment - 03/01/20 1451    Subjective Patient reports that she feels like she is gaining weight and is going to see MD Friday morning.  She reports that when she leaves PT she feels better but the pain returns    Currently in Pain? Yes    Pain Score 4     Pain Location Back  Pain Orientation Lower    Pain Descriptors / Indicators Sore;Tightness    Aggravating Factors  she reports that she is not sure of what causes the pain                             [facility] Adult PT Treatment/Exercise - 03/01/20 0001      Lumbar Exercises: Stretches   Passive Hamstring Stretch Right;Left;4 reps;20 seconds    Lower Trunk Rotation 5 reps;10 seconds    Piriformis Stretch Right;Left;3 reps;20 seconds      Lumbar Exercises: Aerobic   Nustep L5 x 6 min      Lumbar Exercises: Standing   Other Standing Lumbar Exercises 3# hip marches, abduction and extension  holding walker      Lumbar Exercises: Supine   Other Supine Lumbar Exercises ball b/n knee squeeze hook lying, then green tband clamshells hooklying    Other Supine Lumbar Exercises feet on ball K2C, trunk rotation, small bridges and isometric abs      Moist Heat Therapy   Number Minutes Moist Heat 10 Minutes    Moist Heat Location Lumbar Spine      Electrical Stimulation   Electrical Stimulation Location L/S area    Electrical Stimulation Action IFC    Electrical Stimulation Parameters supine    Electrical Stimulation Goals Pain      Manual Therapy   Manual Therapy Manual Traction    Manual Traction sheet traction bouts of 1 minut rest 20 seconds then repeat x 10                    PT Short Term Goals - 02/02/20 1011      PT SHORT TERM GOAL #1   Title independent with initial HEP    Status Partially Met             PT Long Term Goals - 03/01/20 1524      PT LONG TERM GOAL #1   Title indepednent with advanced HEP    Status On-going      PT LONG TERM GOAL #2   Title decrease pain 50%    Status Partially Met      PT LONG TERM GOAL #3   Title incresae hip strength to 4+/5    Status Partially Met                 Plan - 03/01/20 1521    Clinical Impression Statement Patietn reports that she saw the MD yesterday, she continues to report mostly low back and sacral pain.  She reports that the hip area is not hurting much at all, all motions seem to cause the low back and L/S pain.  I tried manual sheet traction today that she reports that it "feels like it is opening up"  I was fairly light with the pull as she has had times that she will have increased pain with activitity    PT Next Visit Plan see if the traction helped her pain, she does seem to have concerns of her abdomen and will be seeing an MD on Friday for this    Consulted and Agree with Plan of Care Patient           Patient will benefit from skilled therapeutic intervention in order to  improve the following deficits and impairments:  Abnormal gait,Improper body mechanics,Pain,Increased muscle spasms,Decreased mobility,Decreased activity tolerance,Decreased range of motion,Decreased strength,Impaired flexibility,Difficulty walking,Postural dysfunction  Visit Diagnosis: Difficulty in walking, not elsewhere classified  Pain in left hip  Acute bilateral low back pain without sciatica  Balance problem  Muscle spasm of back     Problem List Patient Active Problem List   Diagnosis Date Noted  . Other fatigue 03/05/2019  . H/O total shoulder replacement, left 10/23/2018  . PUD (peptic ulcer disease) 03/21/2018  . Nausea and vomiting 01/11/2018  . Synovial cyst of lumbar facet joint 12/23/2017  . Osteoarthritis of right hip 04/24/2017  . Primary osteoarthritis of right hip 04/24/2017  . Complete rotator cuff tear 05/09/2016  . Brainstem stroke ([place]) 01/04/2015  . Dizziness and giddiness 01/04/2015  . Post concussion syndrome 01/04/2015  . Rotator cuff tear, right 11/21/2011   PHYSICAL THERAPY DISCHARGE SUMMARY  Visits from Start of Care: 10 Plan: Patient agrees to discharge.  Patient goals were not met. Patient is being discharged due to the physician's request.  ?????    .  Sumner Boast., PT 03/01/2020, 3:25 PM  [place]. [place], [place], [address] Phone: [phone]   Fax:  [phone]  Name: Cheryl Cooper MRN: [id number] Date of Birth: [birth year]

## 2020-03-03 ENCOUNTER — Other Ambulatory Visit: Payer: Self-pay

## 2020-03-03 ENCOUNTER — Encounter: Payer: Self-pay | Admitting: Family Medicine

## 2020-03-03 ENCOUNTER — Ambulatory Visit: Payer: Medicare PPO | Admitting: Physical Therapy

## 2020-03-03 ENCOUNTER — Ambulatory Visit: Payer: Medicare PPO | Admitting: Family Medicine

## 2020-03-03 VITALS — BP 121/75 | HR 67 | Temp 97.6°F | Ht 63.0 in | Wt 152.0 lb

## 2020-03-03 DIAGNOSIS — R140 Abdominal distension (gaseous): Secondary | ICD-10-CM

## 2020-03-03 DIAGNOSIS — R1032 Left lower quadrant pain: Secondary | ICD-10-CM | POA: Diagnosis not present

## 2020-03-03 DIAGNOSIS — R7989 Other specified abnormal findings of blood chemistry: Secondary | ICD-10-CM | POA: Diagnosis not present

## 2020-03-03 DIAGNOSIS — R1031 Right lower quadrant pain: Secondary | ICD-10-CM

## 2020-03-03 DIAGNOSIS — R011 Cardiac murmur, unspecified: Secondary | ICD-10-CM | POA: Diagnosis not present

## 2020-03-03 LAB — COMPREHENSIVE METABOLIC PANEL
ALT: 58 IU/L — ABNORMAL HIGH (ref 0–32)
AST: 61 IU/L — ABNORMAL HIGH (ref 0–40)
Albumin/Globulin Ratio: 1.3 (ref 1.2–2.2)
Albumin: 4.1 g/dL (ref 3.7–4.7)
Alkaline Phosphatase: 45 IU/L (ref 44–121)
BUN/Creatinine Ratio: 13 (ref 12–28)
BUN: 13 mg/dL (ref 8–27)
Bilirubin Total: 0.2 mg/dL (ref 0.0–1.2)
CO2: 23 mmol/L (ref 20–29)
Calcium: 9.4 mg/dL (ref 8.7–10.3)
Chloride: 102 mmol/L (ref 96–106)
Creatinine, Ser: 1 mg/dL (ref 0.57–1.00)
GFR calc Af Amer: 63 mL/min/{1.73_m2} (ref >59)
GFR calc non Af Amer: 54 mL/min/{1.73_m2} — ABNORMAL LOW (ref >59)
Globulin, Total: 3.2 g/dL (ref 1.5–4.5)
Glucose: 120 mg/dL — ABNORMAL HIGH (ref 65–99)
Potassium: 4.2 mmol/L (ref 3.5–5.2)
Sodium: 139 mmol/L (ref 134–144)
Total Protein: 7.3 g/dL (ref 6.0–8.5)

## 2020-03-03 LAB — CBC
Hematocrit: 42.8 % (ref 34.0–46.6)
Hemoglobin: 13.9 g/dL (ref 11.1–15.9)
MCH: 27.4 pg (ref 26.6–33.0)
MCHC: 32.5 g/dL (ref 31.5–35.7)
MCV: 84 fL (ref 79–97)
Platelets: 220 10*3/uL (ref 150–450)
RBC: 5.07 x10E6/uL (ref 3.77–5.28)
RDW: 14.6 % (ref 11.7–15.4)
WBC: 7.3 10*3/uL (ref 3.4–10.8)

## 2020-03-03 NOTE — Patient Instructions (Addendum)
I will check labs again today, but ordered CT today to find cause of abdominal swelling. If any fever, worse pain, or other new symptoms - be seen in emergency room.  Additionally I will refer you to cardiology as I do hear a heart murmur today.  You may need an updated echocardiogram.  Return to the clinic or go to the nearest emergency room if any of your symptoms worsen or new symptoms occur.   Abdominal Pain, Adult Pain in the abdomen (abdominal pain) can be caused by many things. Often, abdominal pain is not serious and it gets better with no treatment or by being treated at home. However, sometimes abdominal pain is serious. Your health care provider will ask questions about your medical history and do a physical exam to try to determine the cause of your abdominal pain. Follow these instructions at home: Medicines  Take over-the-counter and prescription medicines only as told by your health care provider.  Do not take a laxative unless told by your health care provider. General instructions  Watch your condition for any changes.  Drink enough fluid to keep your urine pale yellow.  Keep all follow-up visits as told by your health care provider. This is important.   Contact a health care provider if:  Your abdominal pain changes or gets worse.  You are not hungry or you lose weight without trying.  You are constipated or have diarrhea for more than 2-3 days.  You have pain when you urinate or have a bowel movement.  Your abdominal pain wakes you up at night.  Your pain gets worse with meals, after eating, or with certain foods.  You are vomiting and cannot keep anything down.  You have a fever.  You have blood in your urine. Get help right away if:  Your pain does not go away as soon as your health care provider told you to expect.  You cannot stop vomiting.  Your pain is only in areas of the abdomen, such as the right side or the left lower portion of the  abdomen. Pain on the right side could be caused by appendicitis.  You have bloody or black stools, or stools that look like tar.  You have severe pain, cramping, or bloating in your abdomen.  You have signs of dehydration, such as: ? Dark urine, very little urine, or no urine. ? Cracked lips. ? Dry mouth. ? Sunken eyes. ? Sleepiness. ? Weakness.  You have trouble breathing or chest pain. Summary  Often, abdominal pain is not serious and it gets better with no treatment or by being treated at home. However, sometimes abdominal pain is serious.  Watch your condition for any changes.  Take over-the-counter and prescription medicines only as told by your health care provider.  Contact a health care provider if your abdominal pain changes or gets worse.  Get help right away if you have severe pain, cramping, or bloating in your abdomen. This information is not intended to replace advice given to you by your health care provider. Make sure you discuss any questions you have with your health care provider. Document Revised: 03/26/2019 Document Reviewed: 06/15/2018 Elsevier Patient Education  2021 [employer].   Abdominal Bloating When you have abdominal bloating, your abdomen may feel full, tight, or painful. It may also look bigger than normal or swollen (distended). Common causes of abdominal bloating include:  Swallowing air.  Constipation.  Problems digesting food.  Eating too much.  Irritable bowel syndrome. This  is a condition that affects the large intestine.  Lactose intolerance. This is an inability to digest lactose, a natural sugar in dairy products.  Celiac disease. This is a condition that affects the ability to digest gluten, a protein found in some grains.  Gastroparesis. This is a condition that slows down the movement of food in the stomach and small intestine. It is more common in people with diabetes mellitus.  Gastroesophageal reflux disease (GERD).  This is a digestive condition that makes stomach acid flow back into the esophagus.  Urinary retention. This means that the body is holding onto urine, and the bladder cannot be emptied all the way. Follow these instructions at home: Eating and drinking  Avoid eating too much.  Try not to swallow air while talking or eating.  Avoid eating while lying down.  Avoid these foods and drinks: ? Foods that cause gas, such as broccoli, cabbage, cauliflower, and baked beans. ? Carbonated drinks. ? Hard candy. ? Chewing gum. Medicines  Take over-the-counter and prescription medicines only as told by your health care provider.  Take probiotic medicines. These medicines contain live bacteria or yeasts that can help digestion.  Take coated peppermint oil capsules. Activity  Try to exercise regularly. Exercise may help to relieve bloating that is caused by gas and relieve constipation. General instructions  Keep all follow-up visits as told by your health care provider. This is important. Contact a health care provider if:  You have nausea and vomiting.  You have diarrhea.  You have abdominal pain.  You have unusual weight loss or weight gain.  You have severe pain, and medicines do not help. Get help right away if:  You have severe chest pain.  You have trouble breathing.  You have shortness of breath.  You have trouble urinating.  You have darker urine than normal.  You have blood in your stools or have dark, tarry stools. Summary  Abdominal bloating means that the abdomen is swollen.  Common causes of abdominal bloating are swallowing air, constipation, and problems digesting food.  Avoid eating too much and avoid swallowing air.  Avoid foods that cause gas, carbonated drinks, hard candy, and chewing gum. This information is not intended to replace advice given to you by your health care provider. Make sure you discuss any questions you have with your health care  provider. Document Revised: 05/25/2018 Document Reviewed: 03/08/2016 Elsevier Patient Education  2021 [employer].   If you have lab work done today you will be contacted with your lab results within the next 2 weeks.  If you have not heard from [place] then please contact us. The fastest way to get your results is to register for My Chart.   IF you received an x-ray today, you will receive an invoice from [name]. Please contact [facility] at [phone] with questions or concerns regarding your invoice.   IF you received labwork today, you will receive an invoice from [facility]. Please contact [facility] at [phone] with questions or concerns regarding your invoice.   Our billing staff will not be able to assist you with questions regarding bills from these companies.  You will be contacted with the lab results as soon as they are available. The fastest way to get your results is to activate your My Chart account. Instructions are located on the last page of this paperwork. If you have not heard from [place] regarding the results in 2 weeks, please contact this office.

## 2020-03-03 NOTE — Progress Notes (Signed)
Subjective:  Patient ID: Cheryl Cooper, female    DOB: 1940-11-12  Age: 80 y.o. MRN: [id number]  CC:  Chief Complaint  Patient presents with  . Follow-up    On labs from last OV.  [name] Bloated    Pt reports being bloated starting sometime last month. Pt reports abdominal area feels hard. PT reports pain when pressure is applied to the L side.Pt reports she knows her liver enzymes are elevated and stated she thinks her liver is what's causing the bloating.    HPI Cheryl Cooper presents for   Abdominal bloating Notes feeling bloated, past 1 month. Previous elevated LFTs, noted on her labs at December 1 visit when being seen for chronic medical conditions. No abd pain or bloating when I saw her in December.  Abdomen started to swell over past 2 weeks - started after Christmas. No pain unless pushing on it. No n/v. Normal bowel movements. No fever. Has remained swollen.  No vaginal bleeding.  Urinating normally - no hematuria.  Eating/drinking ok.  Alcohol - 1 glass 2 times per week or less. No recent increase.  Rare use of tylenol - not daily.  LFTs were normal in January 2021, and borderline elevated at AST 45, ALT 44 in June.  Elevated further at December 3rd labs -  with AST 222, ALT 302. Repeat testing improved to December 15 with AST 76, ALT 138, and then again improved January 6th with AST 53 ALT 60.  Unknown timing of last colonoscopy - ? Few years ago. GI - Dr. Liane Comber at [facility] in [place].   Lab Results  Component Value Date   ALT 60 (H) 02/24/2020   AST 53 (H) 02/24/2020   ALKPHOS 47 02/24/2020   BILITOT 0.2 02/24/2020   Lab Results  Component Value Date   CREATININE 0.94 01/21/2020    Echocardiogram in October 2016 with EF 65 to 70%.  Mild basal hypertrophy of the septum.  Normal wall motion, no regional wall motion abnormalities.  Aortic sclerosis.  No restriction of mobility, transvalvular velocity was within normal range.  No stenosis.  No  regurgitation.  History Patient Active Problem List   Diagnosis Date Noted  . Other fatigue 03/05/2019  . H/O total shoulder replacement, left 10/23/2018  . PUD (peptic ulcer disease) 03/21/2018  . Nausea and vomiting 01/11/2018  . Synovial cyst of lumbar facet joint 12/23/2017  . Osteoarthritis of right hip 04/24/2017  . Primary osteoarthritis of right hip 04/24/2017  . Complete rotator cuff tear 05/09/2016  . Brainstem stroke ([place]) 01/04/2015  . Dizziness and giddiness 01/04/2015  . Post concussion syndrome 01/04/2015  . Rotator cuff tear, right 11/21/2011   Past Medical History:  Diagnosis Date  . Anxiety   . Carpal tunnel syndrome   . Deafness in left ear   . Depression   . Diabetes mellitus    diet controlled  . GERD (gastroesophageal reflux disease)   . Hypercholesteremia   . Hypertension   . Hypothyroid   . Memory loss    reports d/t concussion  . PONV (postoperative nausea and vomiting) yrs ago, none recent  . Post concussion syndrome   . Stroke [facility])    reports they were silent   Past Surgical History:  Procedure Laterality Date  . ABDOMINAL HYSTERECTOMY  1986  . BACK SURGERY  2010   lower  . BIOPSY  01/12/2018   Procedure: BIOPSY;  Surgeon: Rush Landmark Telford Nab., MD;  Location: [facility];  Service: Gastroenterology;;  .  CHOLECYSTECTOMY    . ESOPHAGOGASTRODUODENOSCOPY (EGD) WITH PROPOFOL N/A 01/12/2018   Procedure: ESOPHAGOGASTRODUODENOSCOPY (EGD) WITH PROPOFOL;  Surgeon: Rush Landmark Telford Nab., MD;  Location: [facility];  Service: Gastroenterology;  Laterality: N/A;  . left elobow surgery  2003  . left rotator cuff  2001  . LUMBAR LAMINECTOMY/DECOMPRESSION MICRODISCECTOMY Left 12/23/2017   Procedure: Laminectomy for facet/synovial cyst - left - Lumbar three-Lumbar four;  Surgeon: Earnie Larsson, MD;  Location: [place];  Service: Neurosurgery;  Laterality: Left;  . r foot surgery  1995  . REVERSE SHOULDER ARTHROPLASTY Left 10/23/2018   Procedure: REVERSE  TOTAL SHOULDER ARTHROPLASTY;  Surgeon: Netta Cedars, MD;  Location: [facility];  Service: Orthopedics;  Laterality: Left;  interscalene block  . right hand surgery  2001  . right index finger surgery  2009  . SHOULDER OPEN ROTATOR CUFF REPAIR  11/21/2011   Procedure: ROTATOR CUFF REPAIR SHOULDER OPEN;  Surgeon: Johnn Hai, MD;  Location: [facility];  Service: Orthopedics;  Laterality: Right;  with subacromial decompression  . SHOULDER OPEN ROTATOR CUFF REPAIR Right 05/09/2016   Procedure: Right shoulder mini open revision rotator cuff repair, subacromial decompression;  Surgeon: Susa Day, MD;  Location: [facility];  Service: Orthopedics;  Laterality: Right;  . TOTAL HIP ARTHROPLASTY Right 04/24/2017   Procedure: RIGHT TOTAL HIP ARTHROPLASTY ANTERIOR APPROACH;  Surgeon: Rod Can, MD;  Location: [facility];  Service: Orthopedics;  Laterality: Right;  Needs RNFA   Allergies  Allergen Reactions  . Other Other (See Comments)    Tomato sauce, garlic, onion - severe acid reflux   . Flexeril [Cyclobenzaprine] Other (See Comments)    Per spouse "she felt like a zombie"  . Atorvastatin Other (See Comments)    Unbalanced  . Chlordiazepoxide-Clidinium Other (See Comments)    Dizziness (intolerance)  . Codeine Nausea Only   Prior to Admission medications   Medication Sig Start Date End Date Taking? Authorizing Provider  alendronate (FOSAMAX) 70 MG tablet Take 1 tablet (70 mg total) by mouth every Sunday. Take with a full glass of water on an empty stomach. 12/27/18  Yes Wendie Agreste, MD  amLODipine (NORVASC) 5 MG tablet Take 1.5 tablets (7.5 mg total) by mouth daily. 01/19/20  Yes Wendie Agreste, MD  Calcium Carbonate-Vitamin D (CALCIUM-D PO) Take 2 tablets by mouth daily. CHEWABLES   Yes [provider]  escitalopram (LEXAPRO) 20 MG tablet Take 1 tablet (20 mg total) by mouth daily. 01/19/20  Yes Wendie Agreste, MD  esomeprazole (NEXIUM) 40 MG capsule Take 1 capsule (40 mg total) by  mouth 2 (two) times daily before a meal. 01/13/18  Yes Mikhail, [place], DO  ezetimibe (ZETIA) 10 MG tablet Take 1 tablet (10 mg total) by mouth daily. 01/19/20  Yes Wendie Agreste, MD  levothyroxine (SYNTHROID) 75 MCG tablet Take 1 tablet (75 mcg total) by mouth daily with breakfast. 01/19/20  Yes Wendie Agreste, MD  meloxicam (MOBIC) 15 MG tablet Take 15 mg by mouth daily as needed for pain.  07/07/19  Yes [provider]  Multiple Vitamins-Minerals (WOMENS MULTI GUMMIES PO) Take 2 tablets by mouth daily.   Yes [provider]  oxybutynin (DITROPAN XL) 15 MG 24 hr tablet Take 1 tablet (15 mg total) by mouth at bedtime. 01/19/20  Yes Wendie Agreste, MD  simvastatin (ZOCOR) 20 MG tablet Take 1 tablet (20 mg total) by mouth at bedtime. 01/19/20  Yes Wendie Agreste, MD  traMADol (ULTRAM) 50 MG tablet Take 1  tablet (50 mg total) by mouth every 6 (six) hours as needed. 10/10/19  Yes Virgel Manifold, MD  traZODone (DESYREL) 50 MG tablet Take 0.5 tablets (25 mg total) by mouth at bedtime. 01/19/20  Yes Wendie Agreste, MD   Social History   Socioeconomic History  . Marital status: Married    Spouse name: Herschel  . Number of children: 3  . Years of education: 63  . Highest education level: Not on file  Occupational History    Comment: retired  Tobacco Use  . Smoking status: Never Smoker  . Smokeless tobacco: Never Used  Vaping Use  . Vaping Use: Never used  Substance and Sexual Activity  . Alcohol use: Yes    Alcohol/week: 1.0 standard drink    Types: 1 Glasses of wine per week    Comment: occas  . Drug use: No  . Sexual activity: Not Currently    Comment: 1st intercourse 80 yo-Fewer than 5 partners  Other Topics Concern  . Not on file  Social History Narrative   Married, lives at home with husband   caffeine - 1 Coke daily   Social Determinants of Health   Financial Resource Strain: Not on file  Food Insecurity: Not on file  Transportation Needs: Not on  file  Physical Activity: Not on file  Stress: Not on file  Social Connections: Not on file  Intimate Partner Violence: Not on file    Review of Systems Per HPI.   Objective:   Vitals:   03/03/20 1028  BP: 121/75  Pulse: 67  Temp: 97.6 F (36.4 C)  TempSrc: Temporal  SpO2: 95%  Weight: 152 lb (68.9 kg)  Height: 5\' 3"  (1.6 m)     Physical Exam Vitals reviewed.  Constitutional:      Appearance: She is well-developed and well-nourished.  HENT:     Head: Normocephalic and atraumatic.  Eyes:     Extraocular Movements: EOM normal.     Conjunctiva/sclera: Conjunctivae normal.     Pupils: Pupils are equal, round, and reactive to light.  Neck:     Vascular: No carotid bruit.  Cardiovascular:     Rate and Rhythm: Normal rate and regular rhythm.     Pulses: Intact distal pulses.     Heart sounds: Murmur ([id number] holosystolic murmur. ) heard.    Pulmonary:     Effort: Pulmonary effort is normal.     Breath sounds: Normal breath sounds.  Abdominal:     General: There is distension.     Palpations: Abdomen is soft. There is no pulsatile mass.     Tenderness: There is abdominal tenderness (Abdomen diffusely distended, no upper quadrant tenderness palpation, negative Murphy's.  She is tender at the right lower quadrant greater than left lower quadrant, pelvis nontender.  Minimal guarding on the right lower quadrant with deep palpation.). There is guarding. There is no right CVA tenderness, left CVA tenderness or rebound.  Skin:    General: Skin is warm and dry.  Neurological:     Mental Status: She is alert and oriented to person, place, and time.  Psychiatric:        Mood and Affect: Mood and affect normal.        Behavior: Behavior normal.     Assessment & Plan:  JAMAR RUMMELL is a 80 y.o. female . LLQ abdominal pain - Plan: CBC RLQ abdominal pain - Plan: CBC Abdominal distension - Plan: Comprehensive metabolic panel Elevated LFTs - Plan: Comprehensive metabolic  panel Abdominal distension (gaseous) - Plan: CT Abdomen Pelvis Wo Contrast  -Previous elevated LFTs noted in December that were improving.  Onset of abdominal distention since that time, past few weeks.  Some lower quadrant tenderness right greater than left without fever, nausea, vomiting or other systemic symptoms.  No pedal edema appreciated.  No sign of anasarca.  -Repeat LFTs, check CBC, check CT abdomen/pelvis.  With only lower discomfort unlikely bacteria peritonitis at this time, but ER precautions given if any increasing discomfort or fevers.  Further work-up determined by CT  Heart murmur - Plan: Ambulatory referral to Cardiology  -Previous aortic sclerosis without apparent stenosis or restriction.  Prominent murmur heard today.  Check CBC as above, refer to cardiology.  Anticipate updated echo.  No orders of the defined types were placed in this encounter.  Patient Instructions    I will check labs again today, but ordered CT today to find cause of abdominal swelling. If any fever, worse pain, or other new symptoms - be seen in emergency room.  Return to the clinic or go to the nearest emergency room if any of your symptoms worsen or new symptoms occur.   Abdominal Pain, Adult Pain in the abdomen (abdominal pain) can be caused by many things. Often, abdominal pain is not serious and it gets better with no treatment or by being treated at home. However, sometimes abdominal pain is serious. Your health care provider will ask questions about your medical history and do a physical exam to try to determine the cause of your abdominal pain. Follow these instructions at home: Medicines  Take over-the-counter and prescription medicines only as told by your health care provider.  Do not take a laxative unless told by your health care provider. General instructions  Watch your condition for any changes.  Drink enough fluid to keep your urine pale yellow.  Keep all follow-up visits  as told by your health care provider. This is important.   Contact a health care provider if:  Your abdominal pain changes or gets worse.  You are not hungry or you lose weight without trying.  You are constipated or have diarrhea for more than 2-3 days.  You have pain when you urinate or have a bowel movement.  Your abdominal pain wakes you up at night.  Your pain gets worse with meals, after eating, or with certain foods.  You are vomiting and cannot keep anything down.  You have a fever.  You have blood in your urine. Get help right away if:  Your pain does not go away as soon as your health care provider told you to expect.  You cannot stop vomiting.  Your pain is only in areas of the abdomen, such as the right side or the left lower portion of the abdomen. Pain on the right side could be caused by appendicitis.  You have bloody or black stools, or stools that look like tar.  You have severe pain, cramping, or bloating in your abdomen.  You have signs of dehydration, such as: ? Dark urine, very little urine, or no urine. ? Cracked lips. ? Dry mouth. ? Sunken eyes. ? Sleepiness. ? Weakness.  You have trouble breathing or chest pain. Summary  Often, abdominal pain is not serious and it gets better with no treatment or by being treated at home. However, sometimes abdominal pain is serious.  Watch your condition for any changes.  Take over-the-counter and prescription medicines only as told by your health care  provider.  Contact a health care provider if your abdominal pain changes or gets worse.  Get help right away if you have severe pain, cramping, or bloating in your abdomen. This information is not intended to replace advice given to you by your health care provider. Make sure you discuss any questions you have with your health care provider. Document Revised: 03/26/2019 Document Reviewed: 06/15/2018 Elsevier Patient Education  2021 [place].   Abdominal Bloating When you have abdominal bloating, your abdomen may feel full, tight, or painful. It may also look bigger than normal or swollen (distended). Common causes of abdominal bloating include:  Swallowing air.  Constipation.  Problems digesting food.  Eating too much.  Irritable bowel syndrome. This is a condition that affects the large intestine.  Lactose intolerance. This is an inability to digest lactose, a natural sugar in dairy products.  Celiac disease. This is a condition that affects the ability to digest gluten, a protein found in some grains.  Gastroparesis. This is a condition that slows down the movement of food in the stomach and small intestine. It is more common in people with diabetes mellitus.  Gastroesophageal reflux disease (GERD). This is a digestive condition that makes stomach acid flow back into the esophagus.  Urinary retention. This means that the body is holding onto urine, and the bladder cannot be emptied all the way. Follow these instructions at home: Eating and drinking  Avoid eating too much.  Try not to swallow air while talking or eating.  Avoid eating while lying down.  Avoid these foods and drinks: ? Foods that cause gas, such as broccoli, cabbage, cauliflower, and baked beans. ? Carbonated drinks. ? Hard candy. ? Chewing gum. Medicines  Take over-the-counter and prescription medicines only as told by your health care provider.  Take probiotic medicines. These medicines contain live bacteria or yeasts that can help digestion.  Take coated peppermint oil capsules. Activity  Try to exercise regularly. Exercise may help to relieve bloating that is caused by gas and relieve constipation. General instructions  Keep all follow-up visits as told by your health care provider. This is important. Contact a health care provider if:  You have nausea and vomiting.  You have diarrhea.  You have abdominal pain.  You  have unusual weight loss or weight gain.  You have severe pain, and medicines do not help. Get help right away if:  You have severe chest pain.  You have trouble breathing.  You have shortness of breath.  You have trouble urinating.  You have darker urine than normal.  You have blood in your stools or have dark, tarry stools. Summary  Abdominal bloating means that the abdomen is swollen.  Common causes of abdominal bloating are swallowing air, constipation, and problems digesting food.  Avoid eating too much and avoid swallowing air.  Avoid foods that cause gas, carbonated drinks, hard candy, and chewing gum. This information is not intended to replace advice given to you by your health care provider. Make sure you discuss any questions you have with your health care provider. Document Revised: 05/25/2018 Document Reviewed: 03/08/2016 Elsevier Patient Education  2021 [employer].   If you have lab work done today you will be contacted with your lab results within the next 2 weeks.  If you have not heard from [place] then please contact us. The fastest way to get your results is to register for My Chart.   IF you received an x-ray today, you will receive  an Pharmacologist from [facility]. Please contact [facility] at [phone] with questions or concerns regarding your invoice.   IF you received labwork today, you will receive an invoice from [place]. Please contact [facility] at [phone] with questions or concerns regarding your invoice.   Our billing staff will not be able to assist you with questions regarding bills from these companies.  You will be contacted with the lab results as soon as they are available. The fastest way to get your results is to activate your My Chart account. Instructions are located on the last page of this paperwork. If you have not heard from [place] regarding the results in 2 weeks, please contact this office.          Signed, Merri Ray, MD [facility]

## 2020-03-11 NOTE — Progress Notes (Signed)
Cardiology Office Note:   Date:  03/14/2020  NAME:  Cheryl Cooper    MRN: [id number] DOB:  1940-03-05   PCP:  Wendie Agreste, MD  Cardiologist:  No primary care provider on file.   Referring MD: Wendie Agreste, MD   Chief Complaint  Patient presents with  . Heart Murmur   History of Present Illness:   Cheryl Cooper is a 80 y.o. female with a hx of DM, HLD, HTN who is being seen today for the evaluation of murmur at the request of Wendie Agreste, MD.  She has a noticeable 3 out of 6 late peaking systolic ejection murmur consistent with aortic stenosis.  The late peaking nature mean this is likely significant.  She had an echocardiogram in 2016 which showed aortic valve sclerosis but no evidence of stenosis.  Rather interesting things have progressed.  She denies any chest pain or shortness of breath.  Medical history is significant for diabetes hyperlipidemia as well as hypertension.  She is also had issues with transaminitis.  I instructed her to stop her simvastatin for now.  Liver enzymes improved without anything.  She reports significant bloating.  She has a CT of her abdomen pelvis plan.  No history of heavy drinking or cirrhosis.  She does not smoke, drink alcohol or use drugs.  EKG demonstrates sinus bradycardia heart rate 59 with no acute ischemic changes or evidence of infarction.  Diabetes seems to be well controlled.  Most recent LDL 77.  Problem List 1. DM -A1c 6.7 2. HLD -T chol 147, HDL 48, LDL 77, TG 124 3. HTN  Past Medical History: Past Medical History:  Diagnosis Date  . Anxiety   . Carpal tunnel syndrome   . Deafness in left ear   . Depression   . Diabetes mellitus    diet controlled  . GERD (gastroesophageal reflux disease)   . Hypercholesteremia   . Hypertension   . Hypothyroid   . Memory loss    reports d/t concussion  . PONV (postoperative nausea and vomiting) yrs ago, none recent  . Post concussion syndrome   . Stroke [facility])    reports they  were silent    Past Surgical History: Past Surgical History:  Procedure Laterality Date  . ABDOMINAL HYSTERECTOMY  1986  . BACK SURGERY  2010   lower  . BIOPSY  01/12/2018   Procedure: BIOPSY;  Surgeon: Rush Landmark Telford Nab., MD;  Location: [facility];  Service: Gastroenterology;;  . CHOLECYSTECTOMY    . ESOPHAGOGASTRODUODENOSCOPY (EGD) WITH PROPOFOL N/A 01/12/2018   Procedure: ESOPHAGOGASTRODUODENOSCOPY (EGD) WITH PROPOFOL;  Surgeon: Rush Landmark Telford Nab., MD;  Location: [facility];  Service: Gastroenterology;  Laterality: N/A;  . left elobow surgery  2003  . left rotator cuff  2001  . LUMBAR LAMINECTOMY/DECOMPRESSION MICRODISCECTOMY Left 12/23/2017   Procedure: Laminectomy for facet/synovial cyst - left - Lumbar three-Lumbar four;  Surgeon: Earnie Larsson, MD;  Location: [place];  Service: Neurosurgery;  Laterality: Left;  . r foot surgery  1995  . REVERSE SHOULDER ARTHROPLASTY Left 10/23/2018   Procedure: REVERSE TOTAL SHOULDER ARTHROPLASTY;  Surgeon: Netta Cedars, MD;  Location: [facility];  Service: Orthopedics;  Laterality: Left;  interscalene block  . right hand surgery  2001  . right index finger surgery  2009  . SHOULDER OPEN ROTATOR CUFF REPAIR  11/21/2011   Procedure: ROTATOR CUFF REPAIR SHOULDER OPEN;  Surgeon: Johnn Hai, MD;  Location: [facility];  Service: Orthopedics;  Laterality: Right;  with subacromial decompression  . SHOULDER OPEN ROTATOR CUFF REPAIR Right 05/09/2016   Procedure: Right shoulder mini open revision rotator cuff repair, subacromial decompression;  Surgeon: Susa Day, MD;  Location: [facility];  Service: Orthopedics;  Laterality: Right;  . TOTAL HIP ARTHROPLASTY Right 04/24/2017   Procedure: RIGHT TOTAL HIP ARTHROPLASTY ANTERIOR APPROACH;  Surgeon: Rod Can, MD;  Location: [facility];  Service: Orthopedics;  Laterality: Right;  Needs RNFA    Current Medications: Current Meds  Medication Sig  . alendronate (FOSAMAX) 70 MG tablet Take 1 tablet (70 mg  total) by mouth every Sunday. Take with a full glass of water on an empty stomach.  [name] amLODipine (NORVASC) 5 MG tablet Take 1.5 tablets (7.5 mg total) by mouth daily.  . Calcium Carbonate-Vitamin D (CALCIUM-D PO) Take 2 tablets by mouth daily. CHEWABLES  . escitalopram (LEXAPRO) 20 MG tablet Take 1 tablet (20 mg total) by mouth daily.  [name] esomeprazole (NEXIUM) 40 MG capsule Take 1 capsule (40 mg total) by mouth 2 (two) times daily before a meal.  . ezetimibe (ZETIA) 10 MG tablet Take 1 tablet (10 mg total) by mouth daily.  [name] levothyroxine (SYNTHROID) 75 MCG tablet Take 1 tablet (75 mcg total) by mouth daily with breakfast.  . meloxicam (MOBIC) 15 MG tablet Take 15 mg by mouth daily as needed for pain.   . Multiple Vitamins-Minerals (WOMENS MULTI GUMMIES PO) Take 2 tablets by mouth daily.  [name] oxybutynin (DITROPAN XL) 15 MG 24 hr tablet Take 1 tablet (15 mg total) by mouth at bedtime.  . simvastatin (ZOCOR) 20 MG tablet Take 1 tablet (20 mg total) by mouth at bedtime.  . traMADol (ULTRAM) 50 MG tablet Take 1 tablet (50 mg total) by mouth every 6 (six) hours as needed.  . traZODone (DESYREL) 50 MG tablet Take 0.5 tablets (25 mg total) by mouth at bedtime.     Allergies:    Other, Flexeril [cyclobenzaprine], Atorvastatin, Chlordiazepoxide-clidinium, and Codeine   Social History: Social History   Socioeconomic History  . Marital status: Married    Spouse name: Herschel  . Number of children: 3  . Years of education: 39  . Highest education level: Not on file  Occupational History  . 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 Smoking status: Never Smoker  . Smokeless tobacco: Never Used  Vaping Use  . Vaping Use: Never used  Substance and Sexual Activity  . Alcohol use: Yes    Alcohol/week: 1.0 standard drink    Types: 1 Glasses of wine per week    Comment: occas  . Drug use: No  . Sexual activity: Not Currently    Comment: 1st intercourse 80 yo-Fewer than  5 partners  Other Topics Concern  . Not on file  Social History Narrative   Married, lives at home with husband   caffeine - 1 Coke daily   Social Determinants of Health   Financial Resource Strain: Not on file  Food Insecurity: Not on file  Transportation Needs: Not on file  Physical Activity: Not on file  Stress: Not on file  Social Connections: Not on file     Family History: The patient's family history includes Dementia in her brother; Diabetes in her brother; Heart attack in her father and mother; Heart disease in her brother; Stroke in her brother, father, sister, and sister.  ROS:   All other ROS reviewed and negative. Pertinent positives noted in the HPI.  EKGs/Labs/Other Studies Reviewed:   The following studies were personally reviewed by me today:  EKG:  EKG is ordered today.  The ekg ordered today demonstrates normal sinus rhythm heart rate 59, no acute ischemic changes no evidence of infarction edema, and was personally reviewed by me.   Recent Labs: 01/21/2020: TSH 3.740 03/03/2020: ALT 58; BUN 13; Creatinine, Ser 1.00; Hemoglobin 13.9; Platelets 220; Potassium 4.2; Sodium 139   Recent Lipid Panel    Component Value Date/Time   CHOL 147 01/21/2020 0958   TRIG 124 01/21/2020 0958   HDL 48 01/21/2020 0958   CHOLHDL 3.1 01/21/2020 0958   LDLCALC 77 01/21/2020 0958    Physical Exam:   VS:  BP 118/65   Pulse (!) 59   Ht 5\' 3"  (1.6 m)   Wt 152 lb 12.8 oz (69.3 kg)   SpO2 96%   BMI 27.07 kg/m    Wt Readings from Last 3 Encounters:  03/14/20 152 lb 12.8 oz (69.3 kg)  03/03/20 152 lb (68.9 kg)  01/19/20 150 lb (68 kg)    General: Well nourished, well developed, in no acute distress Head: Atraumatic, normal size  Eyes: PEERLA, EOMI  Neck: Supple, no JVD Endocrine: No thryomegaly Cardiac: Normal S1, S2; RRR; 3 out of 6, late peaking systolic ejection murmur consistent aortic stenosis, radiates into carotids Lungs: Clear to auscultation bilaterally, no  wheezing, rhonchi or rales  Abd: Soft, nontender, no hepatomegaly  Ext: No edema, pulses 2+ Musculoskeletal: No deformities, BUE and BLE strength normal and equal Skin: Warm and dry, no rashes   Neuro: Alert and oriented to person, place, time, and situation, CNII-XII grossly intact, no focal deficits  Psych: Normal mood and affect   ASSESSMENT:   Cheryl Cooper is a 80 y.o. female who presents for the following: 1. Nonrheumatic aortic valve stenosis   2. Murmur, cardiac   3. Mixed hyperlipidemia     PLAN:   1. Nonrheumatic aortic valve stenosis 2. Murmur, cardiac -3-6 systolic ejection murmur that is late peaking and radiates into the carotids.  I do not hear an S2.  Appears she may have severe aortic stenosis.  She has no symptoms from this.  She needs an echocardiogram.  Her echo in 2016 showed no significant stenosis aortic valve.  We will have to recheck an echocardiogram to see what is changed.  I will plan to see her back after her ultrasound. -She has abdominal bloating.  No significant right heart failure symptoms.  No lower extremity edema.  Unclear what is going on.  CT abdomen pelvis is pending.  We need to sort out.  We will continue with her echocardiogram.  3. Mixed hyperlipidemia -I instructed her to stop her pravastatin given her transaminitis.  It has improved.  We will hold until we know what is going on. Zetia ok.   Disposition: Return in about 6 months (around 09/11/2020).  Medication Adjustments/Labs and Tests Ordered: Current medicines are reviewed at length with the patient today.  Concerns regarding medicines are outlined above.  Orders Placed This Encounter  Procedures  . EKG 12-Lead  . ECHOCARDIOGRAM COMPLETE   No orders of the defined types were placed in this encounter.   Patient Instructions  Medication Instructions:  Continue current medications  *If you need a refill on your cardiac medications before your next appointment, please call your  pharmacy*   Lab Work: None Ordered   Testing/Procedures: Your physician has requested that you have an echocardiogram. Echocardiography is a  painless test that uses sound waves to create images of your heart. It provides your doctor with information about the size and shape of your heart and how well your heart's chambers and valves are working. This procedure takes approximately one hour. There are no restrictions for this procedure.   Follow-Up: At [facility], you and your health needs are our priority.  As part of our continuing mission to provide you with exceptional heart care, we have created designated Provider Care Teams.  These Care Teams include your primary Cardiologist (physician) and Advanced Practice Providers (APPs -  Physician Assistants and Nurse Practitioners) who all work together to provide you with the care you need, when you need it.  We recommend signing up for the patient portal called "MyChart".  Sign up information is provided on this After Visit Summary.  MyChart is used to connect with patients for Virtual Visits (Telemedicine).  Patients are able to view lab/test results, encounter notes, upcoming appointments, etc.  Non-urgent messages can be sent to your provider as well.   To learn more about what you can do with MyChart, go to [email or web address].    Your next appointment:   6 month(s)  The format for your next appointment:   In Person  Provider:   You may see Eleonore Chiquito, MD or one of the following Advanced Practice Providers on your designated Care Team:    Almyra Deforest, PA-C  Fabian Sharp, PA-C or   Roby Lofts, PA-C          Signed, Addison Naegeli. Audie Box, [facility]  [address] [place],  [address] [phone]  03/14/2020 5:22 PM

## 2020-03-13 ENCOUNTER — Ambulatory Visit: Payer: Medicare PPO | Admitting: Physical Therapy

## 2020-03-14 ENCOUNTER — Ambulatory Visit: Payer: Medicare PPO | Admitting: Cardiovascular Disease

## 2020-03-14 ENCOUNTER — Encounter: Payer: Self-pay | Admitting: Cardiovascular Disease

## 2020-03-14 ENCOUNTER — Other Ambulatory Visit: Payer: Self-pay

## 2020-03-14 VITALS — BP 118/65 | HR 59 | Ht 63.0 in | Wt 152.8 lb

## 2020-03-14 DIAGNOSIS — I350 Nonrheumatic aortic (valve) stenosis: Secondary | ICD-10-CM | POA: Diagnosis not present

## 2020-03-14 DIAGNOSIS — E782 Mixed hyperlipidemia: Secondary | ICD-10-CM | POA: Diagnosis not present

## 2020-03-14 DIAGNOSIS — R011 Cardiac murmur, unspecified: Secondary | ICD-10-CM | POA: Diagnosis not present

## 2020-03-14 NOTE — Patient Instructions (Signed)
Medication Instructions:  Continue current medications  *If you need a refill on your cardiac medications before your next appointment, please call your pharmacy*   Lab Work: None Ordered   Testing/Procedures: Your physician has requested that you have an echocardiogram. Echocardiography is a painless test that uses sound waves to create images of your heart. It provides your doctor with information about the size and shape of your heart and how well your heart's chambers and valves are working. This procedure takes approximately one hour. There are no restrictions for this procedure.   Follow-Up: At [facility], you and your health needs are our priority.  As part of our continuing mission to provide you with exceptional heart care, we have created designated Provider Care Teams.  These Care Teams include your primary Cardiologist (physician) and Advanced Practice Providers (APPs -  Physician Assistants and Nurse Practitioners) who all work together to provide you with the care you need, when you need it.  We recommend signing up for the patient portal called "MyChart".  Sign up information is provided on this After Visit Summary.  MyChart is used to connect with patients for Virtual Visits (Telemedicine).  Patients are able to view lab/test results, encounter notes, upcoming appointments, etc.  Non-urgent messages can be sent to your provider as well.   To learn more about what you can do with MyChart, go to [email or web address].    Your next appointment:   6 month(s)  The format for your next appointment:   In Person  Provider:   You may see Eleonore Chiquito, MD or one of the following Advanced Practice Providers on your designated Care Team:    Almyra Deforest, PA-C  Fabian Sharp, PA-C or   Roby Lofts, [place]

## 2020-03-16 ENCOUNTER — Other Ambulatory Visit: Payer: Self-pay

## 2020-03-16 ENCOUNTER — Ambulatory Visit
Admission: RE | Admit: 2020-03-16 | Discharge: 2020-03-16 | Disposition: A | Discharge status: Home / Self Care | Payer: Medicare PPO | Source: Ambulatory Visit | Attending: Family Medicine | Admitting: Family Medicine

## 2020-03-16 DIAGNOSIS — R1030 Lower abdominal pain, unspecified: Secondary | ICD-10-CM | POA: Diagnosis not present

## 2020-03-16 DIAGNOSIS — K449 Diaphragmatic hernia without obstruction or gangrene: Secondary | ICD-10-CM | POA: Diagnosis not present

## 2020-03-16 DIAGNOSIS — R109 Unspecified abdominal pain: Secondary | ICD-10-CM | POA: Diagnosis not present

## 2020-03-16 DIAGNOSIS — K5730 Diverticulosis of large intestine without perforation or abscess without bleeding: Secondary | ICD-10-CM | POA: Diagnosis not present

## 2020-03-16 DIAGNOSIS — R140 Abdominal distension (gaseous): Secondary | ICD-10-CM

## 2020-03-17 ENCOUNTER — Other Ambulatory Visit: Payer: Self-pay | Admitting: Family Medicine

## 2020-03-17 DIAGNOSIS — R1032 Left lower quadrant pain: Secondary | ICD-10-CM

## 2020-03-17 DIAGNOSIS — K5732 Diverticulitis of large intestine without perforation or abscess without bleeding: Secondary | ICD-10-CM

## 2020-03-17 MED ORDER — AMOXICILLIN-POT CLAVULANATE 875-125 MG PO TABS: 1.0000 | ORAL_TABLET | Freq: Two times a day (BID) | ORAL | 0 refills | Status: DC

## 2020-03-20 ENCOUNTER — Telehealth: Payer: Self-pay | Admitting: Family Medicine

## 2020-03-20 ENCOUNTER — Ambulatory Visit: Payer: Medicare PPO | Admitting: Physical Therapy

## 2020-03-20 NOTE — Telephone Encounter (Signed)
Pt states you wanted her to follow up with you specifically on 2/3 2/4 but no availability do you want to double book or have her see other provider

## 2020-03-20 NOTE — Telephone Encounter (Signed)
Pt is calling in stating Cheryl Cooper told her-she had to see only him this 1/3 or 02/22/20. There are no available apps. Can she see a different provider? Please advise  pt at [phone].

## 2020-03-21 NOTE — Telephone Encounter (Signed)
Spoke with patient to schedule follow up appt with provider for Thurs, 03/23/20.

## 2020-03-21 NOTE — Telephone Encounter (Signed)
Sure - any provider ok - recheck abdominal pain, early diverticulitis, started on abx 1/28.

## 2020-03-23 ENCOUNTER — Telehealth: Payer: Self-pay | Admitting: Family Medicine

## 2020-03-23 ENCOUNTER — Other Ambulatory Visit: Payer: Self-pay

## 2020-03-23 ENCOUNTER — Ambulatory Visit: Payer: Medicare PPO | Admitting: Family Medicine

## 2020-03-23 ENCOUNTER — Encounter: Payer: Self-pay | Admitting: Family Medicine

## 2020-03-23 VITALS — BP 142/83 | HR 71 | Temp 98.1°F | Ht 63.0 in | Wt 153.0 lb

## 2020-03-23 DIAGNOSIS — M545 Low back pain, unspecified: Secondary | ICD-10-CM | POA: Diagnosis not present

## 2020-03-23 DIAGNOSIS — K5900 Constipation, unspecified: Secondary | ICD-10-CM

## 2020-03-23 DIAGNOSIS — R140 Abdominal distension (gaseous): Secondary | ICD-10-CM

## 2020-03-23 DIAGNOSIS — G8929 Other chronic pain: Secondary | ICD-10-CM | POA: Diagnosis not present

## 2020-03-23 DIAGNOSIS — K5732 Diverticulitis of large intestine without perforation or abscess without bleeding: Secondary | ICD-10-CM

## 2020-03-23 DIAGNOSIS — R7989 Other specified abnormal findings of blood chemistry: Secondary | ICD-10-CM | POA: Diagnosis not present

## 2020-03-23 DIAGNOSIS — R1032 Left lower quadrant pain: Secondary | ICD-10-CM | POA: Diagnosis not present

## 2020-03-23 NOTE — Progress Notes (Addendum)
Subjective:  Patient ID: Cheryl Cooper, female    DOB: [birth year]  Age: 80 y.o. MRN: [id number]  CC:  Chief Complaint  Patient presents with  . Follow-up    On abdominal pain, early diverticulitis, and lab work/CT results. PT reports no change to her condition since last OV. PT reports having an appt next Friday with cardiology. Pt reports that she has noticed after taking amoxicillin her urine gets bright yellow for a time after, but will go away until she takes it again. No other urinary symptoms to report.     HPI Cheryl Cooper presents for   Abdominal Pain Last discussed January 14, bloated feeling for previous month, previous elevated LFTs in December, peak of AST 222, ALT 302 December 3, improving December 15 and then again January 6.  At 114 visit, noted more bloating previous 2 weeks.  No pain loss present area.  Normal bowel movements.  CBC normal, AST, ALT still mildly elevated but stable at January 14 visit.  CT obtained January 27th -findings suggestive of mild colitis at the descending and sigmoid colon, possible early diverticulitis, thickened distal esophagus, possible esophagitis.  Gastric fold thickening, possible gastritis.  Large volume of stool throughout colon with constipation.  Aortic atherosclerosis.  She has been taking Nexium 40 mg twice daily, started on Augmentin for colitis/diverticulitis January 28th.  Unremarkable appearance of liver. Prior cholecystectomy, no biliary dilatation on recent CT.  Off statin recently d/t transaminitis.   Still feels bloated. Small bowel movements at times. Usually daily. No fever/n/v. No diarrhea.  Taking augmentin BID. No new side effects. miralax has been recommended QD by GI - taking about 4 days per week. Still sore left lower side. No worsening. Some decreased appetite at times.  GI: Dr. Liane Comber at [facility] in [place]. No recent visit.   Chronic back pain: Neurosurgeon Dr. Trenton Gammon, went through PT. Taking some kind of pain  medication twice per day. Unknown name - Rx by Dr. Trenton Gammon. Had 2 surgeries, injections prior.  Still having low back pain - feels like getting worse. Feels like less steady walking, but no assistive device used.   History Patient Active Problem List   Diagnosis Date Noted  . Other fatigue 03/05/2019  . H/O total shoulder replacement, left 10/23/2018  . PUD (peptic ulcer disease) 03/21/2018  . Nausea and vomiting 01/11/2018  . Synovial cyst of lumbar facet joint 12/23/2017  . Osteoarthritis of right hip 04/24/2017  . Primary osteoarthritis of right hip 04/24/2017  . Complete rotator cuff tear 05/09/2016  . 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 Dizziness and giddiness 01/04/2015  . Post concussion syndrome 01/04/2015  . Rotator cuff tear, right 11/21/2011   Past Medical History:  Diagnosis Date  . Anxiety   . Carpal tunnel syndrome   . Deafness in left ear   . Depression   . Diabetes mellitus    diet controlled  . GERD (gastroesophageal reflux disease)   . Hypercholesteremia   . Hypertension   . Hypothyroid   . Memory loss    reports d/t concussion  . PONV (postoperative nausea and vomiting) yrs ago, none recent  . Post concussion syndrome   . Stroke [facility])    reports they were silent   Past Surgical History:  Procedure Laterality Date  . ABDOMINAL HYSTERECTOMY  1986  . BACK SURGERY  2010   lower  . BIOPSY  01/12/2018   Procedure: BIOPSY;  Surgeon: Rush Landmark Telford Nab., MD;  Location: [facility];  Service: Gastroenterology;;  . Lorin [facility]    . ESOPHAGOGASTRODUODENOSCOPY (EGD) WITH PROPOFOL N/A 01/12/2018   Procedure: ESOPHAGOGASTRODUODENOSCOPY (EGD) WITH PROPOFOL;  Surgeon: Rush Landmark Telford Nab., MD;  Location: [facility];  Service: Gastroenterology;  Laterality: N/A;  . left elobow surgery  2003  . left rotator cuff  2001  . LUMBAR LAMINECTOMY/DECOMPRESSION MICRODISCECTOMY Left 12/23/2017   Procedure: Laminectomy for facet/synovial cyst - left - Lumbar  three-Lumbar four;  Surgeon: Earnie Larsson, MD;  Location: [place];  Service: Neurosurgery;  Laterality: Left;  . r foot surgery  1995  . REVERSE SHOULDER ARTHROPLASTY Left 10/23/2018   Procedure: REVERSE TOTAL SHOULDER ARTHROPLASTY;  Surgeon: Netta Cedars, MD;  Location: [facility];  Service: Orthopedics;  Laterality: Left;  interscalene block  . right hand surgery  2001  . right index finger surgery  2009  . SHOULDER OPEN ROTATOR CUFF REPAIR  11/21/2011   Procedure: ROTATOR CUFF REPAIR SHOULDER OPEN;  Surgeon: Johnn Hai, MD;  Location: [facility];  Service: Orthopedics;  Laterality: Right;  with subacromial decompression  . SHOULDER OPEN ROTATOR CUFF REPAIR Right 05/09/2016   Procedure: Right shoulder mini open revision rotator cuff repair, subacromial decompression;  Surgeon: Susa Day, MD;  Location: [facility];  Service: Orthopedics;  Laterality: Right;  . TOTAL HIP ARTHROPLASTY Right 04/24/2017   Procedure: RIGHT TOTAL HIP ARTHROPLASTY ANTERIOR APPROACH;  Surgeon: Rod Can, MD;  Location: [facility];  Service: Orthopedics;  Laterality: Right;  Needs RNFA   Allergies  Allergen Reactions  . Other Other (See Comments)    Tomato sauce, garlic, onion - severe acid reflux   . Flexeril [Cyclobenzaprine] Other (See Comments)    Per spouse "she felt like a zombie"  . Atorvastatin Other (See Comments)    Unbalanced  . Chlordiazepoxide-Clidinium Other (See Comments)    Dizziness (intolerance)  . Codeine Nausea Only   Prior to Admission medications   Medication Sig Start Date End Date Taking? Authorizing Provider  alendronate (FOSAMAX) 70 MG tablet Take 1 tablet (70 mg total) by mouth every Sunday. Take with a full glass of water on an empty stomach. 12/27/18   Wendie Agreste, MD  amLODipine (NORVASC) 5 MG tablet Take 1.5 tablets (7.5 mg total) by mouth daily. 01/19/20   Wendie Agreste, MD  amoxicillin-clavulanate (AUGMENTIN) 875-125 MG tablet Take 1 tablet by mouth 2 (two) times daily. 03/17/20    Wendie Agreste, MD  Calcium Carbonate-Vitamin D (CALCIUM-D PO) Take 2 tablets by mouth daily. CHEWABLES    [provider]  escitalopram (LEXAPRO) 20 MG tablet Take 1 tablet (20 mg total) by mouth daily. 01/19/20   Wendie Agreste, MD  esomeprazole (NEXIUM) 40 MG capsule Take 1 capsule (40 mg total) by mouth 2 (two) times daily before a meal. 01/13/18   Mikhail, Velta Addison, DO  ezetimibe (ZETIA) 10 MG tablet Take 1 tablet (10 mg total) by mouth daily. 01/19/20   Wendie Agreste, MD  levothyroxine (SYNTHROID) 75 MCG tablet Take 1 tablet (75 mcg total) by mouth daily with breakfast. 01/19/20   Wendie Agreste, MD  meloxicam (MOBIC) 15 MG tablet Take 15 mg by mouth daily as needed for pain.  07/07/19   [provider]  Multiple Vitamins-Minerals (WOMENS MULTI GUMMIES PO) Take 2 tablets by mouth daily.    [provider]  oxybutynin (DITROPAN XL) 15 MG 24 hr tablet Take 1 tablet (15 mg total) by mouth at bedtime. 01/19/20   Wendie Agreste, MD  simvastatin (ZOCOR) 20 MG  tablet Take 1 tablet (20 mg total) by mouth at bedtime. 01/19/20   Wendie Agreste, MD  traMADol (ULTRAM) 50 MG tablet Take 1 tablet (50 mg total) by mouth every 6 (six) hours as needed. 10/10/19   Virgel Manifold, MD  traZODone (DESYREL) 50 MG tablet Take 0.5 tablets (25 mg total) by mouth at bedtime. 01/19/20   Wendie Agreste, MD   Social History   Socioeconomic History  . Marital status: Married    Spouse name: Herschel  . Number of children: 3  . Years of education: 53  . Highest education level: Not on file  Occupational History  . 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 Smoking status: Never Smoker  . Smokeless tobacco: Never Used  Vaping Use  . Vaping Use: Never used  Substance and Sexual Activity  . Alcohol use: Yes    Alcohol/week: 1.0 standard drink    Types: 1 Glasses of wine per week    Comment: occas  . Drug use: No  . Sexual activity: Not  Currently    Comment: 1st intercourse 80 yo-Fewer than 5 partners  Other Topics Concern  . Not on file  Social History Narrative   Married, lives at home with husband   caffeine - 1 Coke daily   Social Determinants of Health   Financial Resource Strain: Not on file  Food Insecurity: Not on file  Transportation Needs: Not on file  Physical Activity: Not on file  Stress: Not on file  Social Connections: Not on file  Intimate Partner Violence: Not on file    Review of Systems Per HPI.   Objective:   Vitals:   03/23/20 0811  BP: (!) 142/83  Pulse: 71  Temp: 98.1 F (36.7 C)  TempSrc: Temporal  SpO2: 94%  Weight: 153 lb (69.4 kg)  Height: 5\' 3"  (1.6 m)     Physical Exam Vitals reviewed.  Constitutional:      Appearance: She is well-developed and well-nourished.  HENT:     Head: Normocephalic and atraumatic.  Eyes:     Extraocular Movements: EOM normal.     Conjunctiva/sclera: Conjunctivae normal.     Pupils: Pupils are equal, round, and reactive to light.  Neck:     Vascular: No carotid bruit.  Cardiovascular:     Rate and Rhythm: Normal rate and regular rhythm.     Pulses: Intact distal pulses.     Heart sounds: Normal heart sounds.  Pulmonary:     Effort: Pulmonary effort is normal.     Breath sounds: Normal breath sounds.  Abdominal:     General: There is distension.     Palpations: Abdomen is soft. There is no pulsatile mass.     Tenderness: There is abdominal tenderness (LLQ greater thatn epigastric. ). There is no right CVA tenderness, left CVA tenderness, guarding or rebound.  Musculoskeletal:     Comments: Negative seated straight leg raise, reflexes 2+ at patella and Achilles bilaterally.  Grossly equal strength with lower leg testing.  Reports area of discomfort at the lower paraspinal areas, no focal midline bony tenderness of lumbar spine  Skin:    General: Skin is warm and dry.  Neurological:     Mental Status: She is alert and oriented to  person, place, and time.  Psychiatric:        Mood and Affect: Mood and affect and mood normal.        Behavior: Behavior normal.  Assessment & Plan:  Cheryl Cooper is a 80 y.o. female . LLQ abdominal pain - Plan: Ambulatory referral to Gastroenterology, CBC Diverticulitis of colon - Plan: Ambulatory referral to Gastroenterology Abdominal distension - Plan: Ambulatory referral to Gastroenterology Elevated LFTs - Plan: Comprehensive metabolic panel, Hepatitis panel, acute Constipation, unspecified constipation type  -Colitis on CT, similar symptoms after starting augmentin but no worsening.  Also with some possible gastritis, esophagitis on imaging.  She has been treated with twice daily PPI, but there is a question of whether she may be using NSAIDs at home for her back pain.  Will clarify home meds.  Continue Augmentin for now.  -Repeat LFTs, check hepatitis panel.  Refer to gastroenterology for abdominal symptoms as well as prior elevated LFTs.  RTC/ER precautions.  Chronic low back pain, unspecified back pain laterality, unspecified whether sciatica present  -Followed by neurosurgery, status post PT.  Has had some recurrent/worsening pain, advised to discuss step in treatment with her neurosurgeon with ER/RTC precautions if acute worsening.  Will clarify home meds as above.  8:22 PM 03/25/2020 Labs reviewed, increasing LFTs, with slight elevation of bilirubin.  Normal CBC, negative acute hepatitis panel.  See lab notes.  She has had persistent abdominal distention.  Worsened appetite past few days, no vomiting but has had persistent nausea, feels sensation to belch with trying to eat but again no emesis.  No fever, no chills.  She has been taking diclofenac twice per day for her back pain.  No alcohol, no Tylenol.  -Given worsening symptoms and increasing LFTs, recommended ER evaluation.  She plans to go in the morning.  Overnight ER precautions were given if any acute worsening.  Also  recommended stopping diclofenac given history of peptic ulcer disease and recent imaging indicating possible gastritis, esophagitis.  Understanding expressed.  No orders of the defined types were placed in this encounter.  Patient Instructions     Increase Miralax to daily use, I will refer you to gastroenterology. Continue antibiotic for now.  Call me with the pain med name and what dose. Depending on what you are taking, that could worsen abdominal issues. Continue to hold statin for now.   Call back specialist to discuss options for your back pain, and let them know that your pain has worsened. Return to the clinic or go to the nearest emergency room if any of your symptoms worsen or new symptoms occur.    If you have lab work done today you will be contacted with your lab results within the next 2 weeks.  If you have not heard from [place] then please contact us. The fastest way to get your results is to register for My Chart.   IF you received an x-ray today, you will receive an invoice from [facility]. Please contact [facility] at [phone] with questions or concerns regarding your invoice.   IF you received labwork today, you will receive an invoice from [facility]. Please contact [facility] at [phone] with questions or concerns regarding your invoice.   Our billing staff will not be able to assist you with questions regarding bills from these companies.  You will be contacted with the lab results as soon as they are available. The fastest way to get your results is to activate your My Chart account. Instructions are located on the last page of this paperwork. If you have not heard from [place] regarding the results in 2 weeks, please contact this office.  Signed, Merri Ray, MD [facility]

## 2020-03-23 NOTE — Patient Instructions (Addendum)
   Increase Miralax to daily use, I will refer you to gastroenterology. Continue antibiotic for now.  Call me with the pain med name and what dose. Depending on what you are taking, that could worsen abdominal issues. Continue to hold statin for now.   Call back specialist to discuss options for your back pain, and let them know that your pain has worsened. Return to the clinic or go to the nearest emergency room if any of your symptoms worsen or new symptoms occur.    If you have lab work done today you will be contacted with your lab results within the next 2 weeks.  If you have not heard from [place] then please contact us. The fastest way to get your results is to register for My Chart.   IF you received an x-ray today, you will receive an invoice from [facility]. Please contact [facility] at [phone] with questions or concerns regarding your invoice.   IF you received labwork today, you will receive an invoice from [facility]. Please contact [facility] at [phone] with questions or concerns regarding your invoice.   Our billing staff will not be able to assist you with questions regarding bills from these companies.  You will be contacted with the lab results as soon as they are available. The fastest way to get your results is to activate your My Chart account. Instructions are located on the last page of this paperwork. If you have not heard from [place] regarding the results in 2 weeks, please contact this office.

## 2020-03-23 NOTE — Telephone Encounter (Signed)
03/23/2020 - PATIENT SAW DR. Carlota Raspberry IN THE OFFICE THIS MORNING (03/23/2020). HE TOLD HER TO CALL BACK WITH THE NAME OF HER PAIN MEDICATION BECAUSE HE COULD NOT SEE IT IN HER CHART. IT IS CALLED DICLOFENAC EC 75 mg.  SHE TAKES IT TWO TIMES A DAY FOR HER BACK PAIN. BEST PHONE FOR PATIENT: [phone] (CELL)  MBC

## 2020-03-24 LAB — COMPREHENSIVE METABOLIC PANEL
ALT: 369 IU/L — ABNORMAL HIGH (ref 0–32)
AST: 252 IU/L — ABNORMAL HIGH (ref 0–40)
Albumin/Globulin Ratio: 1.3 (ref 1.2–2.2)
Albumin: 4 g/dL (ref 3.7–4.7)
Alkaline Phosphatase: 85 IU/L (ref 44–121)
BUN/Creatinine Ratio: 8 — ABNORMAL LOW (ref 12–28)
BUN: 7 mg/dL — ABNORMAL LOW (ref 8–27)
Bilirubin Total: 1.4 mg/dL — ABNORMAL HIGH (ref 0.0–1.2)
CO2: 22 mmol/L (ref 20–29)
Calcium: 9.3 mg/dL (ref 8.7–10.3)
Chloride: 98 mmol/L (ref 96–106)
Creatinine, Ser: 0.89 mg/dL (ref 0.57–1.00)
GFR calc Af Amer: 72 mL/min/{1.73_m2} (ref >59)
GFR calc non Af Amer: 63 mL/min/{1.73_m2} (ref >59)
Globulin, Total: 3 g/dL (ref 1.5–4.5)
Glucose: 147 mg/dL — ABNORMAL HIGH (ref 65–99)
Potassium: 4.5 mmol/L (ref 3.5–5.2)
Sodium: 132 mmol/L — ABNORMAL LOW (ref 134–144)
Total Protein: 7 g/dL (ref 6.0–8.5)

## 2020-03-24 LAB — HEPATITIS PANEL, ACUTE
Hep A IgM: NEGATIVE
Hep B C IgM: NEGATIVE
Hep C Virus Ab: <0.1 s/co ratio (ref 0.0–0.9)
Hepatitis B Surface Ag: NEGATIVE

## 2020-03-24 LAB — CBC
Hematocrit: 39.6 % (ref 34.0–46.6)
Hemoglobin: 13.2 g/dL (ref 11.1–15.9)
MCH: 28 pg (ref 26.6–33.0)
MCHC: 33.3 g/dL (ref 31.5–35.7)
MCV: 84 fL (ref 79–97)
Platelets: 156 10*3/uL (ref 150–450)
RBC: 4.72 x10E6/uL (ref 3.77–5.28)
RDW: 14.1 % (ref 11.7–15.4)
WBC: 3.9 10*3/uL (ref 3.4–10.8)

## 2020-03-26 ENCOUNTER — Other Ambulatory Visit: Payer: Self-pay

## 2020-03-26 ENCOUNTER — Emergency Department (HOSPITAL_COMMUNITY): Payer: Medicare PPO

## 2020-03-26 ENCOUNTER — Encounter (HOSPITAL_COMMUNITY): Payer: Self-pay | Admitting: Obstetrics and Gynecology

## 2020-03-26 ENCOUNTER — Inpatient Hospital Stay (HOSPITAL_COMMUNITY)
Admission: EM | Admit: 2020-03-26 | Discharge: 2020-03-31 | DRG: 392 | Disposition: A | Discharge status: Home / Self Care | Payer: Medicare PPO | Attending: Internal Medicine | Admitting: Internal Medicine

## 2020-03-26 DIAGNOSIS — M47817 Spondylosis without myelopathy or radiculopathy, lumbosacral region: Secondary | ICD-10-CM | POA: Diagnosis not present

## 2020-03-26 DIAGNOSIS — I350 Nonrheumatic aortic (valve) stenosis: Secondary | ICD-10-CM | POA: Diagnosis not present

## 2020-03-26 DIAGNOSIS — K648 Other hemorrhoids: Secondary | ICD-10-CM | POA: Diagnosis present

## 2020-03-26 DIAGNOSIS — Z833 Family history of diabetes mellitus: Secondary | ICD-10-CM

## 2020-03-26 DIAGNOSIS — K5900 Constipation, unspecified: Secondary | ICD-10-CM | POA: Diagnosis not present

## 2020-03-26 DIAGNOSIS — E039 Hypothyroidism, unspecified: Secondary | ICD-10-CM | POA: Diagnosis not present

## 2020-03-26 DIAGNOSIS — K529 Noninfective gastroenteritis and colitis, unspecified: Secondary | ICD-10-CM

## 2020-03-26 DIAGNOSIS — Z823 Family history of stroke: Secondary | ICD-10-CM

## 2020-03-26 DIAGNOSIS — K219 Gastro-esophageal reflux disease without esophagitis: Secondary | ICD-10-CM | POA: Diagnosis present

## 2020-03-26 DIAGNOSIS — Z8249 Family history of ischemic heart disease and other diseases of the circulatory system: Secondary | ICD-10-CM | POA: Diagnosis not present

## 2020-03-26 DIAGNOSIS — Z888 Allergy status to other drugs, medicaments and biological substances status: Secondary | ICD-10-CM | POA: Diagnosis not present

## 2020-03-26 DIAGNOSIS — K449 Diaphragmatic hernia without obstruction or gangrene: Secondary | ICD-10-CM | POA: Diagnosis not present

## 2020-03-26 DIAGNOSIS — Z8673 Personal history of transient ischemic attack (TIA), and cerebral infarction without residual deficits: Secondary | ICD-10-CM

## 2020-03-26 DIAGNOSIS — Z885 Allergy status to narcotic agent status: Secondary | ICD-10-CM

## 2020-03-26 DIAGNOSIS — Z79899 Other long term (current) drug therapy: Secondary | ICD-10-CM

## 2020-03-26 DIAGNOSIS — Z7983 Long term (current) use of bisphosphonates: Secondary | ICD-10-CM

## 2020-03-26 DIAGNOSIS — I10 Essential (primary) hypertension: Secondary | ICD-10-CM | POA: Diagnosis not present

## 2020-03-26 DIAGNOSIS — Z9049 Acquired absence of other specified parts of digestive tract: Secondary | ICD-10-CM | POA: Diagnosis not present

## 2020-03-26 DIAGNOSIS — K6389 Other specified diseases of intestine: Secondary | ICD-10-CM | POA: Diagnosis not present

## 2020-03-26 DIAGNOSIS — R17 Unspecified jaundice: Secondary | ICD-10-CM | POA: Diagnosis not present

## 2020-03-26 DIAGNOSIS — E785 Hyperlipidemia, unspecified: Secondary | ICD-10-CM | POA: Diagnosis present

## 2020-03-26 DIAGNOSIS — K5732 Diverticulitis of large intestine without perforation or abscess without bleeding: Secondary | ICD-10-CM | POA: Diagnosis not present

## 2020-03-26 DIAGNOSIS — Z79890 Hormone replacement therapy (postmenopausal): Secondary | ICD-10-CM

## 2020-03-26 DIAGNOSIS — Z96641 Presence of right artificial hip joint: Secondary | ICD-10-CM | POA: Diagnosis present

## 2020-03-26 DIAGNOSIS — K5792 Diverticulitis of intestine, part unspecified, without perforation or abscess without bleeding: Secondary | ICD-10-CM | POA: Diagnosis present

## 2020-03-26 DIAGNOSIS — D696 Thrombocytopenia, unspecified: Secondary | ICD-10-CM | POA: Diagnosis present

## 2020-03-26 DIAGNOSIS — Z96612 Presence of left artificial shoulder joint: Secondary | ICD-10-CM | POA: Diagnosis present

## 2020-03-26 DIAGNOSIS — E119 Type 2 diabetes mellitus without complications: Secondary | ICD-10-CM | POA: Diagnosis present

## 2020-03-26 DIAGNOSIS — Z20822 Contact with and (suspected) exposure to covid-19: Secondary | ICD-10-CM | POA: Diagnosis present

## 2020-03-26 DIAGNOSIS — R7401 Elevation of levels of liver transaminase levels: Secondary | ICD-10-CM | POA: Diagnosis not present

## 2020-03-26 DIAGNOSIS — R011 Cardiac murmur, unspecified: Secondary | ICD-10-CM | POA: Diagnosis not present

## 2020-03-26 DIAGNOSIS — E7800 Pure hypercholesterolemia, unspecified: Secondary | ICD-10-CM | POA: Diagnosis present

## 2020-03-26 DIAGNOSIS — F32A Depression, unspecified: Secondary | ICD-10-CM | POA: Diagnosis present

## 2020-03-26 DIAGNOSIS — F419 Anxiety disorder, unspecified: Secondary | ICD-10-CM | POA: Diagnosis present

## 2020-03-26 DIAGNOSIS — Z90710 Acquired absence of both cervix and uterus: Secondary | ICD-10-CM

## 2020-03-26 LAB — CBC
HCT: 39.7 % (ref 36.0–46.0)
Hemoglobin: 12.9 g/dL (ref 12.0–15.0)
MCH: 27.9 pg (ref 26.0–34.0)
MCHC: 32.5 g/dL (ref 30.0–36.0)
MCV: 85.9 fL (ref 80.0–100.0)
Platelets: 138 10*3/uL — ABNORMAL LOW (ref 150–400)
RBC: 4.62 MIL/uL (ref 3.87–5.11)
RDW: 16 % — ABNORMAL HIGH (ref 11.5–15.5)
WBC: 4.2 10*3/uL (ref 4.0–10.5)
nRBC: 0 % (ref 0.0–0.2)

## 2020-03-26 LAB — URINALYSIS, ROUTINE W REFLEX MICROSCOPIC
Bacteria, UA: NONE SEEN
Bilirubin Urine: NEGATIVE
Glucose, UA: NEGATIVE mg/dL
Hgb urine dipstick: NEGATIVE
Ketones, ur: NEGATIVE mg/dL
Nitrite: NEGATIVE
Protein, ur: NEGATIVE mg/dL
Specific Gravity, Urine: 1.02 (ref 1.005–1.030)
pH: 6 (ref 5.0–8.0)

## 2020-03-26 LAB — GLUCOSE, CAPILLARY: Glucose-Capillary: 104 mg/dL — ABNORMAL HIGH (ref 70–99)

## 2020-03-26 LAB — COMPREHENSIVE METABOLIC PANEL
ALT: 202 U/L — ABNORMAL HIGH (ref 0–44)
AST: 133 U/L — ABNORMAL HIGH (ref 15–41)
Albumin: 3.7 g/dL (ref 3.5–5.0)
Alkaline Phosphatase: 73 U/L (ref 38–126)
Anion gap: 9 (ref 5–15)
BUN: 11 mg/dL (ref 8–23)
CO2: 25 mmol/L (ref 22–32)
Calcium: 9.1 mg/dL (ref 8.9–10.3)
Chloride: 100 mmol/L (ref 98–111)
Creatinine, Ser: 0.9 mg/dL (ref 0.44–1.00)
GFR, Estimated: >60 mL/min (ref >60)
Glucose, Bld: 185 mg/dL — ABNORMAL HIGH (ref 70–99)
Potassium: 4 mmol/L (ref 3.5–5.1)
Sodium: 134 mmol/L — ABNORMAL LOW (ref 135–145)
Total Bilirubin: 1.6 mg/dL — ABNORMAL HIGH (ref 0.3–1.2)
Total Protein: 7.7 g/dL (ref 6.5–8.1)

## 2020-03-26 LAB — LIPASE, BLOOD: Lipase: 23 U/L (ref 11–51)

## 2020-03-26 MED ORDER — ONDANSETRON HCL 4 MG/2ML IJ SOLN: 4.0000 mg | Freq: Four times a day (QID) | INTRAMUSCULAR | Status: DC | PRN

## 2020-03-26 MED ORDER — INSULIN ASPART 100 UNIT/ML ~~LOC~~ SOLN
0.0000 [IU] | Freq: Three times a day (TID) | SUBCUTANEOUS | Status: DC
  Filled 2020-03-26: qty 0.06

## 2020-03-26 MED ORDER — IOHEXOL 300 MG/ML  SOLN
100.0000 mL | Freq: Once | INTRAMUSCULAR | Status: AC | PRN
  Administered 2020-03-26: 100 mL via INTRAVENOUS

## 2020-03-26 MED ORDER — CIPROFLOXACIN IN D5W 400 MG/200ML IV SOLN
400.0000 mg | Freq: Two times a day (BID) | INTRAVENOUS | Status: DC
  Administered 2020-03-27 – 2020-03-31 (×9): 400 mg via INTRAVENOUS
  Filled 2020-03-26 (×9): qty 200

## 2020-03-26 MED ORDER — MORPHINE SULFATE (PF) 2 MG/ML IV SOLN
2.0000 mg | INTRAVENOUS | Status: DC | PRN
  Administered 2020-03-26 – 2020-03-27 (×2): 2 mg via INTRAVENOUS
  Filled 2020-03-26 (×2): qty 1

## 2020-03-26 MED ORDER — ENOXAPARIN SODIUM 40 MG/0.4ML ~~LOC~~ SOLN
40.0000 mg | SUBCUTANEOUS | Status: DC
  Administered 2020-03-26 – 2020-03-30 (×5): 40 mg via SUBCUTANEOUS
  Filled 2020-03-26 (×5): qty 0.4

## 2020-03-26 MED ORDER — TRAZODONE HCL 50 MG PO TABS
25.0000 mg | ORAL_TABLET | Freq: Every day | ORAL | Status: DC
  Administered 2020-03-26 – 2020-03-30 (×5): 25 mg via ORAL
  Filled 2020-03-26 (×5): qty 1

## 2020-03-26 MED ORDER — ONDANSETRON HCL 4 MG PO TABS
4.0000 mg | ORAL_TABLET | Freq: Four times a day (QID) | ORAL | Status: DC | PRN
  Administered 2020-03-27 – 2020-03-30 (×4): 4 mg via ORAL
  Filled 2020-03-26 (×4): qty 1

## 2020-03-26 MED ORDER — METRONIDAZOLE IN NACL 5-0.79 MG/ML-% IV SOLN
500.0000 mg | Freq: Once | INTRAVENOUS | Status: AC
  Administered 2020-03-26: 500 mg via INTRAVENOUS
  Filled 2020-03-26: qty 100

## 2020-03-26 MED ORDER — METRONIDAZOLE IN NACL 5-0.79 MG/ML-% IV SOLN
500.0000 mg | Freq: Three times a day (TID) | INTRAVENOUS | Status: DC
  Administered 2020-03-26 – 2020-03-31 (×14): 500 mg via INTRAVENOUS
  Filled 2020-03-26 (×14): qty 100

## 2020-03-26 MED ORDER — SODIUM CHLORIDE 0.9 % IV SOLN: INTRAVENOUS | Status: DC

## 2020-03-26 MED ORDER — CIPROFLOXACIN IN D5W 400 MG/200ML IV SOLN
400.0000 mg | Freq: Once | INTRAVENOUS | Status: AC
  Administered 2020-03-26: 400 mg via INTRAVENOUS
  Filled 2020-03-26: qty 200

## 2020-03-26 MED ORDER — HYDRALAZINE HCL 20 MG/ML IJ SOLN
10.0000 mg | Freq: Three times a day (TID) | INTRAMUSCULAR | Status: DC | PRN
  Administered 2020-03-30: 10 mg via INTRAVENOUS
  Filled 2020-03-26: qty 1

## 2020-03-26 NOTE — ED Notes (Signed)
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 Will wait for purple man.

## 2020-03-26 NOTE — H&P (Signed)
History and Physical    Cheryl Cooper QQP:[id number] DOB: August 16, 1940 DOA: 03/26/2020  PCP: Wendie Agreste, MD  Patient coming from: Home  Chief Complaint: Abdominal distention  HPI: Cheryl Cooper is a 80 y.o. female with medical history significant of anxiety, HTN, diet controlled DM2. Presenting with abdominal distention and pain. She reports this started 2 weeks ago. Episodic crampy pain along her LLQ. She saw her PCP and got a CT scan. It showed colitis and she was placed on augmentin. She has been compliant on the regimen prescribed, but she pain has not improved. She spoke with her PCP and got a recommendation to go the ED.   Of note, her LFTs have been waxing and waning over the last several weeks while under surveillance with her PCP. She was being referred to GI for workup.   ED Course: CT ab/pelvis showed diverticulitis. She was started on cipro, flagyl. [facility] was called for admission.   Review of Systems:  No V/F, CP, dyspnea. Reports constipation, poor appetite, N.  Reports reflux like symptoms and small bowel movements. Review of systems is otherwise negative for all not mentioned in HPI.   PMHx Past Medical History:  Diagnosis Date  . Anxiety   . Carpal tunnel syndrome   . Deafness in left ear   . Depression   . Diabetes mellitus    diet controlled  . GERD (gastroesophageal reflux disease)   . Hypercholesteremia   . Hypertension   . Hypothyroid   . Memory loss    reports d/t concussion  . PONV (postoperative nausea and vomiting) yrs ago, none recent  . Post concussion syndrome   . Stroke [facility])    reports they were silent    PSHx Past Surgical History:  Procedure Laterality Date  . ABDOMINAL HYSTERECTOMY  1986  . BACK SURGERY  2010   lower  . BIOPSY  01/12/2018   Procedure: BIOPSY;  Surgeon: Rush Landmark Telford Nab., MD;  Location: [facility];  Service: Gastroenterology;;  . CHOLECYSTECTOMY    . ESOPHAGOGASTRODUODENOSCOPY (EGD) WITH PROPOFOL N/A  01/12/2018   Procedure: ESOPHAGOGASTRODUODENOSCOPY (EGD) WITH PROPOFOL;  Surgeon: Rush Landmark Telford Nab., MD;  Location: [facility];  Service: Gastroenterology;  Laterality: N/A;  . left elobow surgery  2003  . left rotator cuff  2001  . LUMBAR LAMINECTOMY/DECOMPRESSION MICRODISCECTOMY Left 12/23/2017   Procedure: Laminectomy for facet/synovial cyst - left - Lumbar three-Lumbar four;  Surgeon: Earnie Larsson, MD;  Location: [place];  Service: Neurosurgery;  Laterality: Left;  . r foot surgery  1995  . REVERSE SHOULDER ARTHROPLASTY Left 10/23/2018   Procedure: REVERSE TOTAL SHOULDER ARTHROPLASTY;  Surgeon: Netta Cedars, MD;  Location: [facility];  Service: Orthopedics;  Laterality: Left;  interscalene block  . right hand surgery  2001  . right index finger surgery  2009  . SHOULDER OPEN ROTATOR CUFF REPAIR  11/21/2011   Procedure: ROTATOR CUFF REPAIR SHOULDER OPEN;  Surgeon: Johnn Hai, MD;  Location: [facility];  Service: Orthopedics;  Laterality: Right;  with subacromial decompression  . SHOULDER OPEN ROTATOR CUFF REPAIR Right 05/09/2016   Procedure: Right shoulder mini open revision rotator cuff repair, subacromial decompression;  Surgeon: Susa Day, MD;  Location: [facility];  Service: Orthopedics;  Laterality: Right;  . TOTAL HIP ARTHROPLASTY Right 04/24/2017   Procedure: RIGHT TOTAL HIP ARTHROPLASTY ANTERIOR APPROACH;  Surgeon: Rod Can, MD;  Location: [facility];  Service: Orthopedics;  Laterality: Right;  Needs RNFA    SocHx  reports that she has  never smoked. She has never used smokeless tobacco. She reports current alcohol use of about 1.0 standard drink of alcohol per week. She reports that she does not use drugs.  Allergies  Allergen Reactions  . Other Other (See Comments)    Tomato sauce, garlic, onion - severe acid reflux   . Flexeril [Cyclobenzaprine] Other (See Comments)    Per spouse "she felt like a zombie"  . Atorvastatin Other (See Comments)    Unbalanced  .  Chlordiazepoxide-Clidinium Other (See Comments)    Dizziness (intolerance)  . Codeine Nausea Only    FamHx Family History  Problem Relation Age of Onset  . Heart attack Mother   . Stroke Father   . Heart attack Father   . Stroke Sister   . Stroke Brother   . Diabetes Brother   . Dementia Brother   . Heart disease Brother   . Stroke Sister        TIA    Prior to Admission medications   Medication Sig Start Date End Date Taking? Authorizing Provider  alendronate (FOSAMAX) 70 MG tablet Take 1 tablet (70 mg total) by mouth every Sunday. Take with a full glass of water on an empty stomach. 12/27/18   Wendie Agreste, MD  amLODipine (NORVASC) 5 MG tablet Take 1.5 tablets (7.5 mg total) by mouth daily. 01/19/20   Wendie Agreste, MD  amoxicillin-clavulanate (AUGMENTIN) 875-125 MG tablet Take 1 tablet by mouth 2 (two) times daily. 03/17/20   Wendie Agreste, MD  Calcium Carbonate-Vitamin D (CALCIUM-D PO) Take 2 tablets by mouth daily. CHEWABLES    [provider]  escitalopram (LEXAPRO) 20 MG tablet Take 1 tablet (20 mg total) by mouth daily. 01/19/20   Wendie Agreste, MD  esomeprazole (NEXIUM) 40 MG capsule Take 1 capsule (40 mg total) by mouth 2 (two) times daily before a meal. 01/13/18   Mikhail, Velta Addison, DO  ezetimibe (ZETIA) 10 MG tablet Take 1 tablet (10 mg total) by mouth daily. 01/19/20   Wendie Agreste, MD  levothyroxine (SYNTHROID) 75 MCG tablet Take 1 tablet (75 mcg total) by mouth daily with breakfast. 01/19/20   Wendie Agreste, MD  meloxicam (MOBIC) 15 MG tablet Take 15 mg by mouth daily as needed for pain.  07/07/19   [provider]  Multiple Vitamins-Minerals (WOMENS MULTI GUMMIES PO) Take 2 tablets by mouth daily.    [provider]  oxybutynin (DITROPAN XL) 15 MG 24 hr tablet Take 1 tablet (15 mg total) by mouth at bedtime. 01/19/20   Wendie Agreste, MD  simvastatin (ZOCOR) 20 MG tablet Take 1 tablet (20 mg total) by mouth at bedtime.  01/19/20   Wendie Agreste, MD  traMADol (ULTRAM) 50 MG tablet Take 1 tablet (50 mg total) by mouth every 6 (six) hours as needed. 10/10/19   Virgel Manifold, MD  traZODone (DESYREL) 50 MG tablet Take 0.5 tablets (25 mg total) by mouth at bedtime. 01/19/20   Wendie Agreste, MD    Physical Exam: Vitals:   03/26/20 0923 03/26/20 1143 03/26/20 1300 03/26/20 1400  BP: (!) 170/86 (!) 150/74 (!) 165/76 (!) 163/93  Pulse: 66 65 75 74  Resp: 18 18 19 20   Temp: 97.9 F (36.6 C) 97.9 F (36.6 C)    TempSrc: Oral Oral    SpO2: 97% 93% 98% 97%    General: 80 y.o. female resting in bed in NAD Eyes: PERRL, normal sclera ENMT: Nares patent w/o discharge, orophaynx clear,  dentition normal, ears w/o discharge/lesions/ulcers Neck: Supple, trachea midline Cardiovascular: RRR, +S1, S2, no g/r, 2/6 SEM, equal pulses throughout Respiratory: CTABL, no w/r/r, normal WOB GI: BS+, mild distention, TTP LLQ, RLQ, no masses noted, no organomegaly noted MSK: No e/c/c Skin: No rashes, bruises, ulcerations noted Neuro: A&O x 3, no focal deficits Psyc: Appropriate interaction and affect, calm/cooperative  Labs on Admission: I have personally reviewed following labs and imaging studies  CBC: Recent Labs  Lab 03/23/20 1037 03/26/20 0953  WBC 3.9 4.2  HGB 13.2 12.9  HCT 39.6 39.7  MCV 84 85.9  PLT 156 130*   Basic Metabolic Panel: Recent Labs  Lab 03/23/20 1037 03/26/20 0953  NA 132* 134*  K 4.5 4.0  CL 98 100  CO2 22 25  GLUCOSE 147* 185*  BUN 7* 11  CREATININE 0.89 0.90  CALCIUM 9.3 9.1   GFR: Estimated Creatinine Clearance: 48.9 mL/min (by C-G formula based on SCr of 0.9 mg/dL). Liver Function Tests: Recent Labs  Lab 03/23/20 1037 03/26/20 0953  AST 252* 133*  ALT 369* 202*  ALKPHOS 85 73  BILITOT 1.4* 1.6*  PROT 7.0 7.7  ALBUMIN 4.0 3.7   Recent Labs  Lab 03/26/20 0953  LIPASE 23   No results for input(s): AMMONIA in the last 168 hours. Coagulation Profile: No results  for input(s): INR, PROTIME in the last 168 hours. Cardiac Enzymes: No results for input(s): CKTOTAL, CKMB, CKMBINDEX, TROPONINI in the last 168 hours. BNP (last 3 results) No results for input(s): PROBNP in the last 8760 hours. HbA1C: No results for input(s): HGBA1C in the last 72 hours. CBG: No results for input(s): GLUCAP in the last 168 hours. Lipid Profile: No results for input(s): CHOL, HDL, LDLCALC, TRIG, CHOLHDL, LDLDIRECT in the last 72 hours. Thyroid Function Tests: No results for input(s): TSH, T4TOTAL, FREET4, T3FREE, THYROIDAB in the last 72 hours. Anemia Panel: No results for input(s): VITAMINB12, FOLATE, FERRITIN, TIBC, IRON, RETICCTPCT in the last 72 hours. Urine analysis:    Component Value Date/Time   COLORURINE YELLOW 03/26/2020 1340   APPEARANCEUR CLEAR 03/26/2020 1340   LABSPEC 1.020 03/26/2020 1340   PHURINE 6.0 03/26/2020 1340   GLUCOSEU NEGATIVE 03/26/2020 1340   HGBUR NEGATIVE 03/26/2020 1340   BILIRUBINUR NEGATIVE 03/26/2020 1340   BILIRUBINUR negative 03/03/2019 1740   KETONESUR NEGATIVE 03/26/2020 1340   PROTEINUR NEGATIVE 03/26/2020 1340   UROBILINOGEN 0.2 03/03/2019 1740   UROBILINOGEN 0.2 12/12/2008 1020   NITRITE NEGATIVE 03/26/2020 1340   LEUKOCYTESUR SMALL (A) 03/26/2020 1340    Radiological Exams on Admission: CT Abdomen Pelvis W Contrast  Result Date: 03/26/2020 CLINICAL DATA:  Abdominal distension.  Left lower quadrant pain. EXAM: CT ABDOMEN AND PELVIS WITH CONTRAST TECHNIQUE: Multidetector CT imaging of the abdomen and pelvis was performed using the standard protocol following bolus administration of intravenous contrast. CONTRAST:  180mL OMNIPAQUE IOHEXOL 300 MG/ML  SOLN COMPARISON:  March 16, 2020 FINDINGS: Lower chest: Small hiatal hernia. Hepatobiliary: No focal liver abnormality is seen. Status post cholecystectomy. No biliary dilatation. Pancreas: Unremarkable. No pancreatic ductal dilatation or surrounding inflammatory changes. Spleen:  Normal in size without focal abnormality. Adrenals/Urinary Tract: Adrenal glands are unremarkable. Kidneys are normal, without renal calculi, focal lesion, or hydronephrosis. Bladder is unremarkable. Stomach/Bowel: Normal appearance of the stomach and small. Redundant colon. Extensive left colonic diverticulosis. Long segment of mucosal thickening in the distal descending and sigmoid colon with mild pericolonic inflammatory changes. Vascular/Lymphatic: Aortic atherosclerosis. No enlarged abdominal or pelvic lymph nodes. Reproductive: Status  post hysterectomy. No adnexal masses. Other: No abdominal wall hernia or abnormality. No abdominopelvic ascites. Musculoskeletal: Mild spondylosis of the lumbosacral spine. IMPRESSION: 1. Long segment of mucosal thickening of the distal descending and sigmoid colon with mild pericolonic inflammatory changes, on the background of extensive diverticulosis. Findings may represent acute diverticulitis or other form of colitis. No evidence of perforation or abscess formation. Colonic malignancy cannot be excluded, although is considered less likely with this appearance. 2. Small hiatal hernia. Aortic Atherosclerosis (ICD10-I70.0). Electronically Signed   By: Fidela Salisbury M.D.   On: 03/26/2020 14:02   Assessment/Plan Abdominal pain Diverticulitis Elevated LFTs Hyperbilirubinemia     - admit to inpatient, med-surg     - CT ab/pelvis: Long segment of mucosal thickening of the distal descending and sigmoid colon with mild pericolonic inflammatory changes, on the background of extensive diverticulosis. Findings may represent acute diverticulitis or other form of colitis. No evidence of perforation or abscess formation. Colonic malignancy cannot be excluded, although is considered less likely with this appearance.     - LFTs are elevated but trending down from previous levels     - no biliary dilation seen, no focal liver lesions     - continue cipro, flagyl     - fluids,  bowel rest     - pain control  HTN     - PRN hydralazine     - resume home regimen when taking PO  Thrombocytopenia     - mild, no bleed noted, follow  DM2     - diet controlled     - last A1c 6.7 (01/21/20)     - follow glucose  Hypothyroidism     - resume home synthroid when taking PO  HLD     - hold home statin d/t elevated LFTs  Anxiety     - resume home regimen when taking PO  DVT prophylaxis: lovenox  Code Status: FULL  Family Communication: None at bedside  Consults called: None   Status is: Inpatient  Remains inpatient appropriate because:Inpatient level of care appropriate due to severity of illness   Dispo: The patient is from: Home              Anticipated d/c is to: Home              Anticipated d/c date is: 2 days              Patient currently is not medically stable to d/c.   Difficult to place patient No  Jonnie Finner DO [facility]  If 7PM-7AM, please contact night-coverage [email or web address]  03/26/2020, 2:44 PM

## 2020-03-26 NOTE — ED Notes (Signed)
Patient ambulatory to restroom. Hat placed in toilet and patient missed it.

## 2020-03-26 NOTE — Progress Notes (Signed)
Contacted C[facility] NP Ouma to request trazadone order by pt request for sleep.

## 2020-03-26 NOTE — ED Provider Notes (Signed)
[facility] DEPT Provider Note   CSN: [id number] Arrival date & time: 03/26/20  [id number]     History Chief Complaint  Patient presents with  . Abdominal Pain    Cheryl Cooper is a 80 y.o. female.  Patient is a 80 year old female who presents with abdominal pain and distention.  She has been having troubles with her abdomen for about the last 1 to 2 months.  She has had some increased distention and pain across her left side.  She was noted to have elevated LFTs since December with a peak on December 3 of an AST of 222 and ALT of 302 per her doctor's notes.  She had a CT scan of her abdomen pelvis on January 27 which showed some mild colitis/diverticulitis and constipation.  She started a course of Augmentin on January 28.  Her LFTs had initially trended down but on repeat lab work done 3 days ago, they had risen up again.  She has had some associated nausea and decreased appetite.  She feels like her abdomen is more bloated than it was in the past.  She has stopped her statins.  It does not appear that she is on any Tylenol-containing products.  She takes tramadol for back pain.  She recently stopped her diclofenac given her reflux symptoms.  Given her worsening symptoms and elevation in her LFTs, she was sent to the ED for further evaluation.  Apparently she has a referral to follow-up with her gastroenterologist who is Dr. Liane Comber in [place] but does not have appointment until February 22.  She reports that she has had some small bowel movements daily.  She is taking MiraLAX with no significant improvement in symptoms.        Past Medical History:  Diagnosis Date  . Anxiety   . Carpal tunnel syndrome   . Deafness in left ear   . Depression   . Diabetes mellitus    diet controlled  . GERD (gastroesophageal reflux disease)   . Hypercholesteremia   . Hypertension   . Hypothyroid   . Memory loss    reports d/t concussion  . PONV (postoperative nausea  and vomiting) yrs ago, none recent  . Post concussion syndrome   . Stroke [facility])    reports they were silent    Patient Active Problem List   Diagnosis Date Noted  . Other fatigue 03/05/2019  . H/O total shoulder replacement, left 10/23/2018  . PUD (peptic ulcer disease) 03/21/2018  . Nausea and vomiting 01/11/2018  . Synovial cyst of lumbar facet joint 12/23/2017  . Osteoarthritis of right hip 04/24/2017  . Primary osteoarthritis of right hip 04/24/2017  . Complete rotator cuff tear 05/09/2016  . Brainstem stroke ([place]) 01/04/2015  . Dizziness and giddiness 01/04/2015  . Post concussion syndrome 01/04/2015  . Rotator cuff tear, right 11/21/2011    Past Surgical History:  Procedure Laterality Date  . ABDOMINAL HYSTERECTOMY  1986  . BACK SURGERY  2010   lower  . BIOPSY  01/12/2018   Procedure: BIOPSY;  Surgeon: Rush Landmark Telford Nab., MD;  Location: [facility];  Service: Gastroenterology;;  . CHOLECYSTECTOMY    . ESOPHAGOGASTRODUODENOSCOPY (EGD) WITH PROPOFOL N/A 01/12/2018   Procedure: ESOPHAGOGASTRODUODENOSCOPY (EGD) WITH PROPOFOL;  Surgeon: Rush Landmark Telford Nab., MD;  Location: [facility];  Service: Gastroenterology;  Laterality: N/A;  . left elobow surgery  2003  . left rotator cuff  2001  . LUMBAR LAMINECTOMY/DECOMPRESSION MICRODISCECTOMY Left 12/23/2017   Procedure: Laminectomy for facet/synovial cyst -  left - Lumbar three-Lumbar four;  Surgeon: Earnie Larsson, MD;  Location: [facility];  Service: Neurosurgery;  Laterality: Left;  . r foot surgery  1995  . REVERSE SHOULDER ARTHROPLASTY Left 10/23/2018   Procedure: REVERSE TOTAL SHOULDER ARTHROPLASTY;  Surgeon: Netta Cedars, MD;  Location: [facility];  Service: Orthopedics;  Laterality: Left;  interscalene block  . right hand surgery  2001  . right index finger surgery  2009  . SHOULDER OPEN ROTATOR CUFF REPAIR  11/21/2011   Procedure: ROTATOR CUFF REPAIR SHOULDER OPEN;  Surgeon: Johnn Hai, MD;  Location: [facility];  Service:  Orthopedics;  Laterality: Right;  with subacromial decompression  . SHOULDER OPEN ROTATOR CUFF REPAIR Right 05/09/2016   Procedure: Right shoulder mini open revision rotator cuff repair, subacromial decompression;  Surgeon: Susa Day, MD;  Location: [facility];  Service: Orthopedics;  Laterality: Right;  . TOTAL HIP ARTHROPLASTY Right 04/24/2017   Procedure: RIGHT TOTAL HIP ARTHROPLASTY ANTERIOR APPROACH;  Surgeon: Rod Can, MD;  Location: [facility];  Service: Orthopedics;  Laterality: Right;  Needs RNFA     OB History    Gravida  5   Para  3   Term      Preterm      AB  2   Living  3     SAB  2   IAB      Ectopic      Multiple      Live Births              Family History  Problem Relation Age of Onset  . Heart attack Mother   . Stroke Father   . Heart attack Father   . Stroke Sister   . Stroke Brother   . Diabetes Brother   . Dementia Brother   . Heart disease Brother   . Stroke Sister        TIA    Social History   Tobacco Use  . Smoking status: Never Smoker  . Smokeless tobacco: Never Used  Vaping Use  . Vaping Use: Never used  Substance Use Topics  . Alcohol use: Yes    Alcohol/week: 1.0 standard drink    Types: 1 Glasses of wine per week    Comment: occas  . Drug use: No    Home Medications Prior to Admission medications   Medication Sig Start Date End Date Taking? Authorizing Provider  alendronate (FOSAMAX) 70 MG tablet Take 1 tablet (70 mg total) by mouth every Sunday. Take with a full glass of water on an empty stomach. 12/27/18   Wendie Agreste, MD  amLODipine (NORVASC) 5 MG tablet Take 1.5 tablets (7.5 mg total) by mouth daily. 01/19/20   Wendie Agreste, MD  amoxicillin-clavulanate (AUGMENTIN) 875-125 MG tablet Take 1 tablet by mouth 2 (two) times daily. 03/17/20   Wendie Agreste, MD  Calcium Carbonate-Vitamin D (CALCIUM-D PO) Take 2 tablets by mouth daily. CHEWABLES    [provider]  escitalopram (LEXAPRO) 20 MG  tablet Take 1 tablet (20 mg total) by mouth daily. 01/19/20   Wendie Agreste, MD  esomeprazole (NEXIUM) 40 MG capsule Take 1 capsule (40 mg total) by mouth 2 (two) times daily before a meal. 01/13/18   Mikhail, Velta Addison, DO  ezetimibe (ZETIA) 10 MG tablet Take 1 tablet (10 mg total) by mouth daily. 01/19/20   Wendie Agreste, MD  levothyroxine (SYNTHROID) 75 MCG tablet Take 1 tablet (75 mcg total) by mouth daily with breakfast. 01/19/20  Wendie Agreste, MD  meloxicam (MOBIC) 15 MG tablet Take 15 mg by mouth daily as needed for pain.  07/07/19   [provider]  Multiple Vitamins-Minerals (WOMENS MULTI GUMMIES PO) Take 2 tablets by mouth daily.    [provider]  oxybutynin (DITROPAN XL) 15 MG 24 hr tablet Take 1 tablet (15 mg total) by mouth at bedtime. 01/19/20   Wendie Agreste, MD  simvastatin (ZOCOR) 20 MG tablet Take 1 tablet (20 mg total) by mouth at bedtime. 01/19/20   Wendie Agreste, MD  traMADol (ULTRAM) 50 MG tablet Take 1 tablet (50 mg total) by mouth every 6 (six) hours as needed. 10/10/19   Virgel Manifold, MD  traZODone (DESYREL) 50 MG tablet Take 0.5 tablets (25 mg total) by mouth at bedtime. 01/19/20   Wendie Agreste, MD    Allergies    Other, Flexeril [cyclobenzaprine], Atorvastatin, Chlordiazepoxide-clidinium, and Codeine  Review of Systems   Review of Systems  Constitutional: Positive for appetite change and fatigue. Negative for chills, diaphoresis and fever.  HENT: Negative for congestion, rhinorrhea and sneezing.   Eyes: Negative.   Respiratory: Negative for cough, chest tightness and shortness of breath.   Cardiovascular: Negative for chest pain and leg swelling.  Gastrointestinal: Positive for abdominal distention, abdominal pain, constipation and nausea. Negative for blood in stool, diarrhea and vomiting.  Genitourinary: Negative for difficulty urinating, flank pain, frequency and hematuria.  Musculoskeletal: Negative for arthralgias and back  pain.  Skin: Negative for rash.  Neurological: Negative for dizziness, speech difficulty, weakness, numbness and headaches.    Physical Exam Updated Vital Signs BP (!) 163/93   Pulse 74   Temp 97.9 F (36.6 C) (Oral)   Resp 20   SpO2 97%   Physical Exam Constitutional:      Appearance: She is well-developed and well-nourished.  HENT:     Head: Normocephalic and atraumatic.  Eyes:     Pupils: Pupils are equal, round, and reactive to light.  Cardiovascular:     Rate and Rhythm: Normal rate and regular rhythm.     Heart sounds: Normal heart sounds.  Pulmonary:     Effort: Pulmonary effort is normal. No respiratory distress.     Breath sounds: Normal breath sounds. No wheezing or rales.  Chest:     Chest wall: No tenderness.  Abdominal:     General: Bowel sounds are normal. There is distension.     Palpations: Abdomen is soft.     Tenderness: There is abdominal tenderness (Tenderness to left mid and lower abdomen, her abdomen is distended but not tympanic). There is no guarding or rebound.  Musculoskeletal:        General: No edema. Normal range of motion.     Cervical back: Normal range of motion and neck supple.  Lymphadenopathy:     Cervical: No cervical adenopathy.  Skin:    General: Skin is warm and dry.     Findings: No rash.  Neurological:     Mental Status: She is alert and oriented to person, place, and time.  Psychiatric:        Mood and Affect: Mood and affect normal.     ED Results / Procedures / Treatments   Labs (all labs ordered are listed, but only abnormal results are displayed) Labs Reviewed  COMPREHENSIVE METABOLIC PANEL - Abnormal; Notable for the following components:      Result Value   Sodium 134 (*)    Glucose, Bld 185 (*)  AST 133 (*)    ALT 202 (*)    Total Bilirubin 1.6 (*)    All other components within normal limits  CBC - Abnormal; Notable for the following components:   RDW 16.0 (*)    Platelets 138 (*)    All other  components within normal limits  URINALYSIS, ROUTINE W REFLEX MICROSCOPIC - Abnormal; Notable for the following components:   Leukocytes,Ua SMALL (*)    All other components within normal limits  SARS CORONAVIRUS 2 (TAT 6-24 HRS)  LIPASE, BLOOD    EKG None  Radiology CT Abdomen Pelvis W Contrast  Result Date: 03/26/2020 CLINICAL DATA:  Abdominal distension.  Left lower quadrant pain. EXAM: CT ABDOMEN AND PELVIS WITH CONTRAST TECHNIQUE: Multidetector CT imaging of the abdomen and pelvis was performed using the standard protocol following bolus administration of intravenous contrast. CONTRAST:  114mL OMNIPAQUE IOHEXOL 300 MG/ML  SOLN COMPARISON:  March 16, 2020 FINDINGS: Lower chest: Small hiatal hernia. Hepatobiliary: No focal liver abnormality is seen. Status post cholecystectomy. No biliary dilatation. Pancreas: Unremarkable. No pancreatic ductal dilatation or surrounding inflammatory changes. Spleen: Normal in size without focal abnormality. Adrenals/Urinary Tract: Adrenal glands are unremarkable. Kidneys are normal, without renal calculi, focal lesion, or hydronephrosis. Bladder is unremarkable. Stomach/Bowel: Normal appearance of the stomach and small. Redundant colon. Extensive left colonic diverticulosis. Long segment of mucosal thickening in the distal descending and sigmoid colon with mild pericolonic inflammatory changes. Vascular/Lymphatic: Aortic atherosclerosis. No enlarged abdominal or pelvic lymph nodes. Reproductive: Status post hysterectomy. No adnexal masses. Other: No abdominal wall hernia or abnormality. No abdominopelvic ascites. Musculoskeletal: Mild spondylosis of the lumbosacral spine. IMPRESSION: 1. Long segment of mucosal thickening of the distal descending and sigmoid colon with mild pericolonic inflammatory changes, on the background of extensive diverticulosis. Findings may represent acute diverticulitis or other form of colitis. No evidence of perforation or abscess  formation. Colonic malignancy cannot be excluded, although is considered less likely with this appearance. 2. Small hiatal hernia. Aortic Atherosclerosis (ICD10-I70.0). Electronically Signed   By: Fidela Salisbury M.D.   On: 03/26/2020 14:02    Procedures Procedures   Medications Ordered in ED Medications  ciprofloxacin (CIPRO) IVPB 400 mg (has no administration in time range)  metroNIDAZOLE (FLAGYL) IVPB 500 mg (has no administration in time range)  iohexol (OMNIPAQUE) 300 MG/ML solution 100 mL (100 mLs Intravenous Contrast Given 03/26/20 1331)    ED Course  I have reviewed the triage vital signs and the nursing notes.  Pertinent labs & imaging results that were available during my care of the patient were reviewed by me and considered in my medical decision making (see chart for details).    MDM Rules/Calculators/A&P                          Patient is a 80 year old female who presents with some worsening abdominal pain associated with decreased appetite and nausea.  She has some elevated LFTs.  Actually a little bit better today than they were last week when they were checked by her PCP.  She is afebrile.  Her CT scan shows evidence of some worsening colitis/diverticulitis.  I started her on Cipro and Flagyl.  I spoke with Dr. Marylyn Ishihara with the hospitalist service to admit the patient for further treatment.  Final Clinical Impression(s) / ED Diagnoses Final diagnoses:  Colitis  Transaminitis    Rx / DC Orders ED Discharge Orders    None  Malvin Johns, MD 03/26/20 [phone]

## 2020-03-26 NOTE — ED Triage Notes (Signed)
Patient reports her PCP Dr. Nyoka Cowden called her to come to the ER. Patient reported her liver enzymes were elevated. Patient's abdomen is distended and hard.

## 2020-03-26 NOTE — ED Notes (Signed)
Attempted to call report, secretary will let nurse know to call me back

## 2020-03-27 ENCOUNTER — Encounter: Payer: Medicare PPO | Admitting: Physical Therapy

## 2020-03-27 DIAGNOSIS — R7401 Elevation of levels of liver transaminase levels: Secondary | ICD-10-CM

## 2020-03-27 DIAGNOSIS — K5792 Diverticulitis of intestine, part unspecified, without perforation or abscess without bleeding: Secondary | ICD-10-CM | POA: Diagnosis not present

## 2020-03-27 LAB — SARS CORONAVIRUS 2 (TAT 6-24 HRS): SARS Coronavirus 2: NEGATIVE

## 2020-03-27 LAB — COMPREHENSIVE METABOLIC PANEL
ALT: 155 U/L — ABNORMAL HIGH (ref 0–44)
AST: 102 U/L — ABNORMAL HIGH (ref 15–41)
Albumin: 3.2 g/dL — ABNORMAL LOW (ref 3.5–5.0)
Alkaline Phosphatase: 68 U/L (ref 38–126)
Anion gap: 9 (ref 5–15)
BUN: 8 mg/dL (ref 8–23)
CO2: 24 mmol/L (ref 22–32)
Calcium: 8.8 mg/dL — ABNORMAL LOW (ref 8.9–10.3)
Chloride: 104 mmol/L (ref 98–111)
Creatinine, Ser: 0.8 mg/dL (ref 0.44–1.00)
GFR, Estimated: >60 mL/min (ref >60)
Glucose, Bld: 112 mg/dL — ABNORMAL HIGH (ref 70–99)
Potassium: 3.9 mmol/L (ref 3.5–5.1)
Sodium: 137 mmol/L (ref 135–145)
Total Bilirubin: 1.2 mg/dL (ref 0.3–1.2)
Total Protein: 6.5 g/dL (ref 6.5–8.1)

## 2020-03-27 LAB — CBC
HCT: 36.4 % (ref 36.0–46.0)
Hemoglobin: 12 g/dL (ref 12.0–15.0)
MCH: 28.1 pg (ref 26.0–34.0)
MCHC: 33 g/dL (ref 30.0–36.0)
MCV: 85.2 fL (ref 80.0–100.0)
Platelets: 144 10*3/uL — ABNORMAL LOW (ref 150–400)
RBC: 4.27 MIL/uL (ref 3.87–5.11)
RDW: 16 % — ABNORMAL HIGH (ref 11.5–15.5)
WBC: 5.2 10*3/uL (ref 4.0–10.5)
nRBC: 0 % (ref 0.0–0.2)

## 2020-03-27 LAB — GLUCOSE, CAPILLARY
Glucose-Capillary: 143 mg/dL — ABNORMAL HIGH (ref 70–99)
Glucose-Capillary: 125 mg/dL — ABNORMAL HIGH (ref 70–99)
Glucose-Capillary: 137 mg/dL — ABNORMAL HIGH (ref 70–99)
Glucose-Capillary: 104 mg/dL — ABNORMAL HIGH (ref 70–99)

## 2020-03-27 MED ORDER — OXYCODONE HCL 5 MG PO TABS
5.0000 mg | ORAL_TABLET | ORAL | Status: DC | PRN
  Administered 2020-03-27 – 2020-03-30 (×6): 5 mg via ORAL
  Filled 2020-03-27 (×6): qty 1

## 2020-03-27 MED ORDER — LIP MEDEX EX OINT
TOPICAL_OINTMENT | CUTANEOUS | Status: AC
  Administered 2020-03-27: 1 via TOPICAL
  Filled 2020-03-27: qty 7

## 2020-03-27 MED ORDER — PANTOPRAZOLE SODIUM 40 MG PO TBEC
40.0000 mg | DELAYED_RELEASE_TABLET | Freq: Every day | ORAL | Status: DC
  Administered 2020-03-27 – 2020-03-31 (×5): 40 mg via ORAL
  Filled 2020-03-27 (×5): qty 1

## 2020-03-27 MED ORDER — DOCUSATE SODIUM 100 MG PO CAPS
100.0000 mg | ORAL_CAPSULE | Freq: Two times a day (BID) | ORAL | Status: DC
  Administered 2020-03-27 – 2020-03-29 (×5): 100 mg via ORAL
  Filled 2020-03-27 (×5): qty 1

## 2020-03-27 MED ORDER — LIP MEDEX EX OINT: TOPICAL_OINTMENT | CUTANEOUS | Status: DC | PRN

## 2020-03-27 MED ORDER — OXYBUTYNIN CHLORIDE ER 5 MG PO TB24
15.0000 mg | ORAL_TABLET | Freq: Every day | ORAL | Status: DC
Start: 2020-03-27 — End: 2020-03-31
  Administered 2020-03-27 – 2020-03-30 (×4): 15 mg via ORAL
  Filled 2020-03-27 (×4): qty 3

## 2020-03-27 MED ORDER — ESCITALOPRAM OXALATE 20 MG PO TABS
20.0000 mg | ORAL_TABLET | Freq: Every day | ORAL | Status: DC
  Administered 2020-03-27 – 2020-03-31 (×5): 20 mg via ORAL
  Filled 2020-03-27 (×5): qty 1

## 2020-03-27 MED ORDER — LEVOTHYROXINE SODIUM 50 MCG PO TABS
75.0000 ug | ORAL_TABLET | Freq: Every day | ORAL | Status: DC
  Administered 2020-03-27 – 2020-03-31 (×5): 75 ug via ORAL
  Filled 2020-03-27 (×5): qty 1

## 2020-03-27 NOTE — Progress Notes (Signed)
[facility] PROGRESS NOTE   Cheryl Cooper [id number] DOB: 02-May-1940 DOA: 03/26/2020  PCP: Wendie Agreste, MD  Brief History/Interval Summary: 80 y.o. female with medical history significant of anxiety, HTN, diet controlled DM2. Presenting with abdominal distention and pain.  Treated in the outpatient setting for colitis versus diverticulitis.  She was being treated with Augmentin.  Was also found to have elevated LFTs.  Improvement in her symptoms she was asked to come to the emergency department.  She was again found to have findings suggestive of colitis versus diverticulitis.  She was hospitalized for further management.  Reason for Visit: Acute diverticulitis versus acute colitis  Consultants: None yet  Procedures: None yet  Antibiotics: Anti-infectives (From admission, onward)   Start     Dose/Rate Route Frequency Ordered Stop   03/27/20 0600  ciprofloxacin (CIPRO) IVPB 400 mg        400 mg 200 mL/hr over 60 Minutes Intravenous Every 12 hours 03/26/20 1836     03/26/20 2200  metroNIDAZOLE (FLAGYL) IVPB 500 mg        500 mg 100 mL/hr over 60 Minutes Intravenous Every 8 hours 03/26/20 1836     03/26/20 1445  ciprofloxacin (CIPRO) IVPB 400 mg        400 mg 200 mL/hr over 60 Minutes Intravenous  Once 03/26/20 1444 03/26/20 1615   03/26/20 1445  metroNIDAZOLE (FLAGYL) IVPB 500 mg        500 mg 100 mL/hr over 60 Minutes Intravenous  Once 03/26/20 1444 03/26/20 1515      Subjective/Interval History: Patient denies any diarrhea as a part of her acute illness.  Continues to have some discomfort in the left side of her abdomen.  No nausea vomiting.  Denies any right upper quadrant abdominal pain.       Assessment/Plan:  Acute diverticulitis versus colitis Patient has had 2 CT scan of her abdomen and pelvis in the last 10 days.  Patient with known history of extensive diverticulosis.  Concern is for diverticulitis versus colitis.  Patient has not had any diarrhea  associated with her acute illness.  This is most likely acute diverticulitis.  Continue with IV antibiotics.  Patient previously treated with Augmentin without relief.  Currently on ciprofloxacin and Flagyl. Okay to put her on clear liquids.  Transaminitis Abnormal LFTs is and not entirely clear.  However they seem to be trending downwards.  CT scan did not show any obvious area of concern in the hepatobiliary system.  Bilirubin is normal.  Alkaline phosphatase is normal.  Could have been drug-induced.  Patient noted to be on statin.  This has been held.  Augmentin can cause hepatitis.   Hepatitis panel unremarkable.  Essential hypertension Monitor blood pressures closely.  Thrombocytopenia Mild.  Seems to be better today.  Continue to monitor.  Diabetes mellitus type 2, diet-controlled Last HbA1c was 6.7 in December.  Monitor CBGs.  Hypothyroidism Okay to resume Synthroid.  Hyperlipidemia Holding statin due to abnormal LFTs.  History of anxiety disorder Resume her other home medications.    DVT Prophylaxis: Lovenox Code Status: Full code Family Communication: Discussed with patient Disposition Plan: Hopefully return home when improved  Status is: Inpatient  Remains inpatient appropriate because:Ongoing active pain requiring inpatient pain management, IV treatments appropriate due to intensity of illness or inability to take PO and Inpatient level of care appropriate due to severity of illness   Dispo: The patient is from: Home  Anticipated d/c is to: Home              Anticipated d/c date is: 2 days              Patient currently is not medically stable to d/c.   Difficult to place patient No      Medications:  Scheduled: . enoxaparin (LOVENOX) injection  40 mg Subcutaneous Q24H  . insulin aspart  0-6 Units Subcutaneous TID WC  . traZODone  25 mg Oral QHS   Continuous: . sodium chloride 75 mL/hr at 03/26/20 2112  . ciprofloxacin 400 mg (03/27/20  0620)  . metronidazole 500 mg (03/27/20 0510)   EXB:[id number], lip balm, morphine injection, ondansetron **OR** ondansetron (ZOFRAN) IV   Objective:  Vital Signs  Vitals:   03/26/20 1905 03/26/20 2257 03/27/20 0311 03/27/20 0621  BP: (!) 174/75 (!) 147/86 128/65 137/68  Pulse: 66 64 66 65  Resp: 14 14 14 14   Temp: 97.6 F (36.4 C) 98.1 F (36.7 C) (!) 97.4 F (36.3 C) 98 F (36.7 C)  TempSrc: Oral Oral Oral Oral  SpO2: 97% 93% 93% 90%  Weight: 67.9 kg       Intake/Output Summary (Last 24 hours) at 03/27/2020 0959 Last data filed at 03/27/2020 0400 Gross per 24 hour  Intake 834.22 ml  Output --  Net 834.22 ml   Filed Weights   03/26/20 1905  Weight: 67.9 kg    General appearance: Awake alert.  In no distress Resp: Clear to auscultation bilaterally.  Normal effort Cardio: S1-S2 is normal regular.  No S3-S4.  No rubs murmurs or bruit GI: Abdomen is soft.  Tender in the left side of the abdomen without any rebound rigidity or guarding.  No masses organomegaly.  Bowel sounds present .   Extremities: No edema.  Full range of motion of lower extremities. Neurologic: Alert and oriented x3.  No focal neurological deficits.    Lab Results:  Data Reviewed: I have personally reviewed following labs and imaging studies  CBC: Recent Labs  Lab 03/23/20 1037 03/26/20 0953 03/27/20 0518  WBC 3.9 4.2 5.2  HGB 13.2 12.9 12.0  HCT 39.6 39.7 36.4  MCV 84 85.9 85.2  PLT 156 138* 144*    Basic Metabolic Panel: Recent Labs  Lab 03/23/20 1037 03/26/20 0953 03/27/20 0518  NA 132* 134* 137  K 4.5 4.0 3.9  CL 98 100 104  CO2 22 25 24   GLUCOSE 147* 185* 112*  BUN 7* 11 8  CREATININE 0.89 0.90 0.80  CALCIUM 9.3 9.1 8.8*    GFR: Estimated Creatinine Clearance: 54.5 mL/min (by C-G formula based on SCr of 0.8 mg/dL).  Liver Function Tests: Recent Labs  Lab 03/23/20 1037 03/26/20 0953 03/27/20 0518  AST 252* 133* 102*  ALT 369* 202* 155*  ALKPHOS 85 73 68   BILITOT 1.4* 1.6* 1.2  PROT 7.0 7.7 6.5  ALBUMIN 4.0 3.7 3.2*    Recent Labs  Lab 03/26/20 0953  LIPASE 23    CBG: Recent Labs  Lab 03/26/20 2116 03/27/20 0737  GLUCAP 104* 143*      Recent Results (from the past 240 hour(s))  SARS CORONAVIRUS 2 (TAT 6-24 HRS) Nasopharyngeal Nasopharyngeal Swab     Status: None   Collection Time: 03/26/20  2:48 PM   Specimen: Nasopharyngeal Swab  Result Value Ref Range Status   SARS Coronavirus 2 NEGATIVE NEGATIVE Final    Comment: (NOTE) SARS-CoV-2 target nucleic acids are NOT DETECTED.  The SARS-CoV-2  RNA is generally detectable in upper and lower respiratory specimens during the acute phase of infection. Negative results do not preclude SARS-CoV-2 infection, do not rule out co-infections with other pathogens, and should not be used as the sole basis for treatment or other patient management decisions. Negative results must be combined with clinical observations, patient history, and epidemiological information. The expected result is Negative.  Fact Sheet for Patients: SugarRoll.be  Fact Sheet for Healthcare Providers: https://www.woods-mathews.com/  This test is not yet approved or cleared by the [place] FDA and  has been authorized for detection and/or diagnosis of SARS-CoV-2 by FDA under an Emergency Use Authorization (EUA). This EUA will remain  in effect (meaning this test can be used) for the duration of the COVID-19 declaration under Se ction 564(b)(1) of the Act, 21 U.S.C. section 360bbb-3(b)(1), unless the authorization is terminated or revoked sooner.  Performed at [facility], [place] [address] [place], [place] [address]       Radiology Studies: CT Abdomen Pelvis W Contrast  Result Date: 03/26/2020 CLINICAL DATA:  Abdominal distension.  Left lower quadrant pain. EXAM: CT ABDOMEN AND PELVIS WITH CONTRAST TECHNIQUE: Multidetector CT imaging of the abdomen and  pelvis was performed using the standard protocol following bolus administration of intravenous contrast. CONTRAST:  133mL OMNIPAQUE IOHEXOL 300 MG/ML  SOLN COMPARISON:  March 16, 2020 FINDINGS: Lower chest: Small hiatal hernia. Hepatobiliary: No focal liver abnormality is seen. Status post cholecystectomy. No biliary dilatation. Pancreas: Unremarkable. No pancreatic ductal dilatation or surrounding inflammatory changes. Spleen: Normal in size without focal abnormality. Adrenals/Urinary Tract: Adrenal glands are unremarkable. Kidneys are normal, without renal calculi, focal lesion, or hydronephrosis. Bladder is unremarkable. Stomach/Bowel: Normal appearance of the stomach and small. Redundant colon. Extensive left colonic diverticulosis. Long segment of mucosal thickening in the distal descending and sigmoid colon with mild pericolonic inflammatory changes. Vascular/Lymphatic: Aortic atherosclerosis. No enlarged abdominal or pelvic lymph nodes. Reproductive: Status post hysterectomy. No adnexal masses. Other: No abdominal wall hernia or abnormality. No abdominopelvic ascites. Musculoskeletal: Mild spondylosis of the lumbosacral spine. IMPRESSION: 1. Long segment of mucosal thickening of the distal descending and sigmoid colon with mild pericolonic inflammatory changes, on the background of extensive diverticulosis. Findings may represent acute diverticulitis or other form of colitis. No evidence of perforation or abscess formation. Colonic malignancy cannot be excluded, although is considered less likely with this appearance. 2. Small hiatal hernia. Aortic Atherosclerosis (ICD10-I70.0). Electronically Signed   By: Fidela Salisbury M.D.   On: 03/26/2020 14:02       LOS: 1 day   [facility] Pager on [email or web address]  03/27/2020, 9:59 AM

## 2020-03-28 DIAGNOSIS — K5792 Diverticulitis of intestine, part unspecified, without perforation or abscess without bleeding: Secondary | ICD-10-CM | POA: Diagnosis not present

## 2020-03-28 DIAGNOSIS — R7401 Elevation of levels of liver transaminase levels: Secondary | ICD-10-CM | POA: Diagnosis not present

## 2020-03-28 LAB — GLUCOSE, CAPILLARY
Glucose-Capillary: 129 mg/dL — ABNORMAL HIGH (ref 70–99)
Glucose-Capillary: 116 mg/dL — ABNORMAL HIGH (ref 70–99)
Glucose-Capillary: 145 mg/dL — ABNORMAL HIGH (ref 70–99)
Glucose-Capillary: 108 mg/dL — ABNORMAL HIGH (ref 70–99)

## 2020-03-28 LAB — COMPREHENSIVE METABOLIC PANEL
ALT: 133 U/L — ABNORMAL HIGH (ref 0–44)
AST: 100 U/L — ABNORMAL HIGH (ref 15–41)
Albumin: 3.3 g/dL — ABNORMAL LOW (ref 3.5–5.0)
Alkaline Phosphatase: 71 U/L (ref 38–126)
Anion gap: 9 (ref 5–15)
BUN: 7 mg/dL — ABNORMAL LOW (ref 8–23)
CO2: 24 mmol/L (ref 22–32)
Calcium: 8.7 mg/dL — ABNORMAL LOW (ref 8.9–10.3)
Chloride: 104 mmol/L (ref 98–111)
Creatinine, Ser: 0.85 mg/dL (ref 0.44–1.00)
GFR, Estimated: >60 mL/min (ref >60)
Glucose, Bld: 110 mg/dL — ABNORMAL HIGH (ref 70–99)
Potassium: 4 mmol/L (ref 3.5–5.1)
Sodium: 137 mmol/L (ref 135–145)
Total Bilirubin: 0.9 mg/dL (ref 0.3–1.2)
Total Protein: 6.5 g/dL (ref 6.5–8.1)

## 2020-03-28 LAB — CBC
HCT: 36.4 % (ref 36.0–46.0)
Hemoglobin: 11.6 g/dL — ABNORMAL LOW (ref 12.0–15.0)
MCH: 27.6 pg (ref 26.0–34.0)
MCHC: 31.9 g/dL (ref 30.0–36.0)
MCV: 86.7 fL (ref 80.0–100.0)
Platelets: 147 10*3/uL — ABNORMAL LOW (ref 150–400)
RBC: 4.2 MIL/uL (ref 3.87–5.11)
RDW: 16.6 % — ABNORMAL HIGH (ref 11.5–15.5)
WBC: 5.1 10*3/uL (ref 4.0–10.5)
nRBC: 0 % (ref 0.0–0.2)

## 2020-03-28 MED ORDER — AMLODIPINE BESYLATE 5 MG PO TABS
5.0000 mg | ORAL_TABLET | Freq: Every day | ORAL | Status: DC
  Administered 2020-03-28 – 2020-03-31 (×4): 5 mg via ORAL
  Filled 2020-03-28 (×4): qty 1

## 2020-03-28 NOTE — Consult Note (Signed)
[facility] Consultation Note  Referring Provider: [facility] Primary Care Physician:  Wendie Agreste, MD Primary Gastroenterologist:  Dr. Gracelyn Nurse [facility])  Reason for Consultation:  Abdominal pain, elevated LFTs  HPI: Cheryl Cooper is a 80 y.o. female with elevated LFTs and lower abdominal pain.  Has had vague lower abdominal pain intermittently for the past few weeks.  CT in late January, and again leading to this admission, showed findings most consistent with left lower quadrant diverticulitis.  Patient sees Dr. Liane Comber in [place] and [place] for most of her GI care; she reports having had both endoscopy and colonoscopy within the past 1-2 years.  I see note of EGD and colonoscopy in care-everywhere from 2020, but I can't access the actual report, but she tells me she thinks everything looked fine.  Patient reports some constipation recently over the past few weeks.  She reports some distention (not appreciable on my exam).  She denies weight loss, blood in stool, diarrhea, fevers.   Past Medical History:  Diagnosis Date  . Anxiety   . Carpal tunnel syndrome   . Deafness in left ear   . Depression   . Diabetes mellitus    diet controlled  . GERD (gastroesophageal reflux disease)   . Hypercholesteremia   . Hypertension   . Hypothyroid   . Memory loss    reports d/t concussion  . PONV (postoperative nausea and vomiting) yrs ago, none recent  . Post concussion syndrome   . Stroke [facility])    reports they were silent    Past Surgical History:  Procedure Laterality Date  . ABDOMINAL HYSTERECTOMY  1986  . BACK SURGERY  2010   lower  . BIOPSY  01/12/2018   Procedure: BIOPSY;  Surgeon: Rush Landmark Telford Nab., MD;  Location: [facility];  Service: Gastroenterology;;  . CHOLECYSTECTOMY    . ESOPHAGOGASTRODUODENOSCOPY (EGD) WITH PROPOFOL N/A 01/12/2018   Procedure: ESOPHAGOGASTRODUODENOSCOPY (EGD) WITH PROPOFOL;  Surgeon: Rush Landmark  Telford Nab., MD;  Location: [facility];  Service: Gastroenterology;  Laterality: N/A;  . left elobow surgery  2003  . left rotator cuff  2001  . LUMBAR LAMINECTOMY/DECOMPRESSION MICRODISCECTOMY Left 12/23/2017   Procedure: Laminectomy for facet/synovial cyst - left - Lumbar three-Lumbar four;  Surgeon: Earnie Larsson, MD;  Location: [place];  Service: Neurosurgery;  Laterality: Left;  . r foot surgery  1995  . REVERSE SHOULDER ARTHROPLASTY Left 10/23/2018   Procedure: REVERSE TOTAL SHOULDER ARTHROPLASTY;  Surgeon: Netta Cedars, MD;  Location: [facility];  Service: Orthopedics;  Laterality: Left;  interscalene block  . right hand surgery  2001  . right index finger surgery  2009  . SHOULDER OPEN ROTATOR CUFF REPAIR  11/21/2011   Procedure: ROTATOR CUFF REPAIR SHOULDER OPEN;  Surgeon: Johnn Hai, MD;  Location: [facility];  Service: Orthopedics;  Laterality: Right;  with subacromial decompression  . SHOULDER OPEN ROTATOR CUFF REPAIR Right 05/09/2016   Procedure: Right shoulder mini open revision rotator cuff repair, subacromial decompression;  Surgeon: Susa Day, MD;  Location: [facility];  Service: Orthopedics;  Laterality: Right;  . TOTAL HIP ARTHROPLASTY Right 04/24/2017   Procedure: RIGHT TOTAL HIP ARTHROPLASTY ANTERIOR APPROACH;  Surgeon: Rod Can, MD;  Location: [facility];  Service: Orthopedics;  Laterality: Right;  Needs RNFA    Prior to Admission medications   Medication Sig Start Date End Date Taking? Authorizing Provider  alendronate (FOSAMAX) 70 MG tablet Take 1 tablet (70 mg total) by mouth every Sunday. Take with a full glass of  water on an empty stomach. 12/27/18  Yes Wendie Agreste, MD  amLODipine (NORVASC) 5 MG tablet Take 1.5 tablets (7.5 mg total) by mouth daily. 01/19/20  Yes Wendie Agreste, MD  amoxicillin (AMOXIL) 500 MG capsule Take 1,000 mg by mouth See admin instructions. Take 2 capsules (1000 mg) by mouth prior to dental appointments   Yes [provider]   amoxicillin-clavulanate (AUGMENTIN) 875-125 MG tablet Take 1 tablet by mouth 2 (two) times daily. 03/17/20  Yes Wendie Agreste, MD  Calcium Carbonate-Vitamin D (CALCIUM-D PO) Take 2 tablets by mouth daily. CHEWABLES   Yes [provider]  escitalopram (LEXAPRO) 20 MG tablet Take 1 tablet (20 mg total) by mouth daily. 01/19/20  Yes Wendie Agreste, MD  esomeprazole (NEXIUM) 40 MG capsule Take 1 capsule (40 mg total) by mouth 2 (two) times daily before a meal. 01/13/18  Yes Mikhail, [place], DO  ezetimibe (ZETIA) 10 MG tablet Take 1 tablet (10 mg total) by mouth daily. 01/19/20  Yes Wendie Agreste, MD  levothyroxine (SYNTHROID) 75 MCG tablet Take 1 tablet (75 mcg total) by mouth daily with breakfast. 01/19/20  Yes Wendie Agreste, MD  Multiple Vitamins-Minerals (WOMENS MULTI GUMMIES PO) Take 2 tablets by mouth daily.   Yes [provider]  oxybutynin (DITROPAN XL) 15 MG 24 hr tablet Take 1 tablet (15 mg total) by mouth at bedtime. 01/19/20  Yes Wendie Agreste, MD  traZODone (DESYREL) 50 MG tablet Take 0.5 tablets (25 mg total) by mouth at bedtime. 01/19/20  Yes Wendie Agreste, MD  simvastatin (ZOCOR) 20 MG tablet Take 1 tablet (20 mg total) by mouth at bedtime. 01/19/20   Wendie Agreste, MD  traMADol (ULTRAM) 50 MG tablet Take 1 tablet (50 mg total) by mouth every 6 (six) hours as needed. Patient not taking: Reported on 03/26/2020 10/10/19   Virgel Manifold, MD    Current Facility-Administered Medications  Medication Dose Route Frequency Provider Last Rate Last Admin  . 0.9 %  sodium chloride infusion   Intravenous Continuous Cherylann Ratel A, DO 75 mL/hr at 03/28/20 0618 New Bag at 03/28/20 0618  . amLODipine (NORVASC) tablet 5 mg  5 mg Oral Daily Bonnielee Haff, MD   5 mg at 03/28/20 1023  . ciprofloxacin (CIPRO) IVPB 400 mg  400 mg Intravenous Q12H Kyle, Tyrone A, DO 200 mL/hr at 03/28/20 0619 400 mg at 03/28/20 0619  . docusate sodium (COLACE) capsule 100 mg  100 mg  Oral BID Bonnielee Haff, MD   100 mg at 03/28/20 1023  . enoxaparin (LOVENOX) injection 40 mg  40 mg Subcutaneous Q24H Kyle, Tyrone A, DO   40 mg at 03/27/20 2112  . escitalopram (LEXAPRO) tablet 20 mg  20 mg Oral Daily Bonnielee Haff, MD   20 mg at 03/28/20 1023  . hydrALAZINE (APRESOLINE) injection 10 mg  10 mg Intravenous Q8H PRN Marylyn Ishihara, Tyrone A, DO      . insulin aspart (novoLOG) injection 0-6 Units  0-6 Units Subcutaneous TID WC Kyle, Tyrone A, DO      . levothyroxine (SYNTHROID) tablet 75 mcg  75 mcg Oral Q breakfast Bonnielee Haff, MD   75 mcg at 03/28/20 0608  . lip balm (CARMEX) ointment   Topical PRN Cherylann Ratel A, DO   1 application at 23/55/73 0509  . metroNIDAZOLE (FLAGYL) IVPB 500 mg  500 mg Intravenous Q8H Kyle, Tyrone A, DO 100 mL/hr at 03/28/20 0517 500 mg at 03/28/20 0517  . morphine  2 MG/ML injection 2 mg  2 mg Intravenous Q4H PRN Marylyn Ishihara, Tyrone A, DO   2 mg at 03/27/20 0912  . ondansetron (ZOFRAN) tablet 4 mg  4 mg Oral Q6H PRN Marylyn Ishihara, Tyrone A, DO   4 mg at 03/27/20 1926   Or  . ondansetron (ZOFRAN) injection 4 mg  4 mg Intravenous Q6H PRN Marylyn Ishihara, Tyrone A, DO      . oxybutynin (DITROPAN-XL) 24 hr tablet 15 mg  15 mg Oral QHS Bonnielee Haff, MD   15 mg at 03/27/20 2106  . oxyCODONE (Oxy IR/ROXICODONE) immediate release tablet 5 mg  5 mg Oral Q4H PRN Bonnielee Haff, MD   5 mg at 03/28/20 1156  . pantoprazole (PROTONIX) EC tablet 40 mg  40 mg Oral Daily Bonnielee Haff, MD   40 mg at 03/28/20 1023  . traZODone (DESYREL) tablet 25 mg  25 mg Oral QHS Lang Snow, NP   25 mg at 03/27/20 2107    Allergies as of 03/26/2020 - Review Complete 03/26/2020  Allergen Reaction Noted  . Other Other (See Comments) 11/18/2011  . Flexeril [cyclobenzaprine] Other (See Comments) 01/05/2018  . Atorvastatin Other (See Comments) 07/04/2015  . Chlordiazepoxide-clidinium Other (See Comments) 07/04/2015  . Codeine Nausea Only 11/21/2011    Family History  Problem Relation Age of  Onset  . Heart attack Mother   . Stroke Father   . Heart attack Father   . Stroke Sister   . Stroke Brother   . Diabetes Brother   . Dementia Brother   . Heart disease Brother   . Stroke Sister        TIA    Social History   Socioeconomic History  . Marital status: Married    Spouse name: Herschel  . Number of children: 3  . Years of education: 69  . Highest education level: Not on file  Occupational History  . 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 Smoking status: Never Smoker  . Smokeless tobacco: Never Used  Vaping Use  . Vaping Use: Never used  Substance and Sexual Activity  . Alcohol use: Yes    Alcohol/week: 1.0 standard drink    Types: 1 Glasses of wine per week    Comment: occas  . Drug use: No  . Sexual activity: Not Currently    Comment: 1st intercourse 80 yo-Fewer than 5 partners  Other Topics Concern  . Not on file  Social History Narrative   Married, lives at home with husband   caffeine - 1 Coke daily   Social Determinants of Health   Financial Resource Strain: Not on file  Food Insecurity: Not on file  Transportation Needs: Not on file  Physical Activity: Not on file  Stress: Not on file  Social Connections: Not on file  Intimate Partner Violence: Not on file    Review of Systems: As per HPI, all others negative  Physical Exam: Vital signs in last 24 hours: Temp:  [98.1 F (36.7 C)-99.1 F (37.3 C)] 99.1 F (37.3 C) (02/08 0459) Pulse Rate:  [57-62] 62 (02/08 0459) Resp:  [16-17] 16 (02/08 0459) BP: (146-163)/(68-79) 149/68 (02/08 0459) SpO2:  [92 %-95 %] 93 % (02/08 0459) Last BM Date: 03/25/20 General:   Alert,  Well-developed, well-nourished, pleasant and cooperative in NAD Head:  Normocephalic and atraumatic. Eyes:  Sclera clear, no icterus.   Conjunctiva pink. Ears:  Normal auditory acuity. Nose:  No deformity, discharge,  or lesions.  Mouth:  No deformity or lesions.  Oropharynx pink &  moist. Neck:  Supple; no masses or thyromegaly. Abdomen:  Soft, protuberant, non-distended.  Mild lower abdominal (Left > right) tenderness to moderately deep palpation No masses, hepatosplenomegaly or hernias noted. Normal bowel sounds, without guarding, and without rebound.     Msk:  Symmetrical without gross deformities. Normal posture. Pulses:  Normal pulses noted. Extremities:  Without clubbing or edema. Neurologic:  Alert and  oriented x4;  grossly normal neurologically. Skin:  Intact without significant lesions or rashes. Psych:  Alert and cooperative. Normal mood and affect.   Lab Results: Recent Labs    03/26/20 0953 03/27/20 0518 03/28/20 0539  WBC 4.2 5.2 5.1  HGB 12.9 12.0 11.6*  HCT 39.7 36.4 36.4  PLT 138* 144* 147*   BMET Recent Labs    03/26/20 0953 03/27/20 0518 03/28/20 0539  NA 134* 137 137  K 4.0 3.9 4.0  CL 100 104 104  CO2 25 24 24   GLUCOSE 185* 112* 110*  BUN 11 8 7*  CREATININE 0.90 0.80 0.85  CALCIUM 9.1 8.8* 8.7*   LFT Recent Labs    03/28/20 0539  PROT 6.5  ALBUMIN 3.3*  AST 100*  ALT 133*  ALKPHOS 71  BILITOT 0.9   PT/INR No results for input(s): LABPROT, INR in the last 72 hours.  Studies/Results: CT Abdomen Pelvis W Contrast  Result Date: 03/26/2020 CLINICAL DATA:  Abdominal distension.  Left lower quadrant pain. EXAM: CT ABDOMEN AND PELVIS WITH CONTRAST TECHNIQUE: Multidetector CT imaging of the abdomen and pelvis was performed using the standard protocol following bolus administration of intravenous contrast. CONTRAST:  110mL OMNIPAQUE IOHEXOL 300 MG/ML  SOLN COMPARISON:  March 16, 2020 FINDINGS: Lower chest: Small hiatal hernia. Hepatobiliary: No focal liver abnormality is seen. Status post cholecystectomy. No biliary dilatation. Pancreas: Unremarkable. No pancreatic ductal dilatation or surrounding inflammatory changes. Spleen: Normal in size without focal abnormality. Adrenals/Urinary Tract: Adrenal glands are unremarkable.  Kidneys are normal, without renal calculi, focal lesion, or hydronephrosis. Bladder is unremarkable. Stomach/Bowel: Normal appearance of the stomach and small. Redundant colon. Extensive left colonic diverticulosis. Long segment of mucosal thickening in the distal descending and sigmoid colon with mild pericolonic inflammatory changes. Vascular/Lymphatic: Aortic atherosclerosis. No enlarged abdominal or pelvic lymph nodes. Reproductive: Status post hysterectomy. No adnexal masses. Other: No abdominal wall hernia or abnormality. No abdominopelvic ascites. Musculoskeletal: Mild spondylosis of the lumbosacral spine. IMPRESSION: 1. Long segment of mucosal thickening of the distal descending and sigmoid colon with mild pericolonic inflammatory changes, on the background of extensive diverticulosis. Findings may represent acute diverticulitis or other form of colitis. No evidence of perforation or abscess formation. Colonic malignancy cannot be excluded, although is considered less likely with this appearance. 2. Small hiatal hernia. Aortic Atherosclerosis (ICD10-I70.0). Electronically Signed   By: Fidela Salisbury M.D.   On: 03/26/2020 14:02    Impression:  1.  Abdominal pain, lower abdominal Left > right.  Most likely due to smoldering diverticulitis. 2.  Abnormal imaging, CT, suspected diverticulitis.  Colitis less favored.  Colon malignancy much less favored.  Patient reports colonoscopy and endoscopy within the past couple years with Dr. Liane Comber [facility]), and I see notes of these procedures being done, but I can't access any of the actual procedure reports on care-everywhere. 3.  Elevated LFTs.  Downtrending.  Prior cholecystectomy.  Medication effect?  Other?  Not typical of acute infectious hepatitis.  No biliary dilatation on imaging studies. Not suspicious for choledocholithiasis.  Recent  acute hepatitis panel negative.  Plan:  1.  Follow LFT trend; given improvement of LFTs, would not pursue  any further serologic work-up at the present time.  Will need to be followed as outpatient. 2.  Continue antibiotics for presumed diverticulitis, which she also appears to have had in late January (and thus I suspect she has had some smoldering diverticulitis for the past few weeks).  She has no diarrhea or blood in stool, thus infectious colitis or ischemic colitis seem much less likely. 3.  Any way to get colonoscopy report from 2020 in [place]? 4.  Advance to full liquid diet. 5.  Given presumably recent negative colonoscopy and imaging concern for diverticulitis, I would advise against sigmoidoscopic evaluation.  However, if symptoms persist and there is credible concern for alternative diagnoses (e.g., if colonoscopy report able to be obtained and she didn't have any appreciable diverticula seen), we could reconsider this position. 6.  [facility] will follow.   LOS: 2 days   Jermar Colter M  03/28/2020, 12:17 PM  Cell [phone] If no answer or after 5 PM call [phone]

## 2020-03-28 NOTE — Progress Notes (Signed)
[facility] PROGRESS NOTE   Cheryl Cooper EGB:[id number] DOB: 14-Apr-1940 DOA: 03/26/2020  PCP: Wendie Agreste, MD  Brief History/Interval Summary: 80 y.o. female with medical history significant of anxiety, HTN, diet controlled DM2. Presenting with abdominal distention and pain.  Treated in the outpatient setting for colitis versus diverticulitis.  She was being treated with Augmentin.  Was also found to have elevated LFTs.  Improvement in her symptoms she was asked to come to the emergency department.  She was again found to have findings suggestive of colitis versus diverticulitis.  She was hospitalized for further management.  Patient does not report much improvement in her abdominal pain as of today.  Reason for Visit: Acute diverticulitis versus acute colitis  Consultants: We will consult gastroenterology today  Procedures: None yet  Antibiotics: Anti-infectives (From admission, onward)   Start     Dose/Rate Route Frequency Ordered Stop   03/27/20 0600  ciprofloxacin (CIPRO) IVPB 400 mg        400 mg 200 mL/hr over 60 Minutes Intravenous Every 12 hours 03/26/20 1836     03/26/20 2200  metroNIDAZOLE (FLAGYL) IVPB 500 mg        500 mg 100 mL/hr over 60 Minutes Intravenous Every 8 hours 03/26/20 1836     03/26/20 1445  ciprofloxacin (CIPRO) IVPB 400 mg        400 mg 200 mL/hr over 60 Minutes Intravenous  Once 03/26/20 1444 03/26/20 1615   03/26/20 1445  metroNIDAZOLE (FLAGYL) IVPB 500 mg        500 mg 100 mL/hr over 60 Minutes Intravenous  Once 03/26/20 1444 03/26/20 1515      Subjective/Interval History: Patient continues to have left-sided abdominal discomfort though she denies any nausea vomiting.  She has not had any bowel movement in several days.  Her oral intake has been very poor in the last week to 10 days.      Assessment/Plan:  Acute diverticulitis versus colitis Patient has had 2 CT scan of her abdomen and pelvis within the last 2 weeks.  Patient  with known history of extensive diverticulosis.  Concern is for diverticulitis versus colitis.  Patient has not had any diarrhea associated with her acute illness.  This was felt to be acute diverticulitis.  Patient was placed on IV antibiotics in the form of ciprofloxacin and Flagyl.  Previously treated with oral Augmentin without any relief.  Patient has not had any bowel movement in several days.  CT scan done in January suggested a lot of stool in the colon.  No mention of same in CT scan done during this hospitalization.  Due to persistent symptoms we will involve gastroenterology to assist with management.   Continue with chest clear liquids for now.  Transaminitis Reason for abnormal LFTs is not entirely clear.  However they seem to be trending downwards.  CT scan did not show any obvious area of concern in the hepatobiliary system.  Bilirubin is normal.  Alkaline phosphatase is normal.  Could have been drug-induced.  Patient noted to be on statin.  This has been held.  Augmentin can cause hepatitis.   Hepatitis panel unremarkable. LFTs have been trending downwards.  Essential hypertension Occasional high readings noted.  We will resume her amlodipine.  Thrombocytopenia Mild.  Seems to be stable.  Continue to monitor periodically.  Diabetes mellitus type 2, diet-controlled Last HbA1c was 6.7 in December.  CBGs are reasonably well controlled.  Continue SSI.  Hypothyroidism Continue Synthroid.  Hyperlipidemia Holding  statin due to abnormal LFTs.  History of anxiety disorder Resume her other home medications.    DVT Prophylaxis: Lovenox Code Status: Full code Family Communication: Discussed with patient Disposition Plan: Hopefully return home when improved  Status is: Inpatient  Remains inpatient appropriate because:Ongoing active pain requiring inpatient pain management, IV treatments appropriate due to intensity of illness or inability to take PO and Inpatient level of care  appropriate due to severity of illness   Dispo: The patient is from: Home              Anticipated d/c is to: Home              Anticipated d/c date is: 2 days              Patient currently is not medically stable to d/c.   Difficult to place patient No      Medications:  Scheduled: . docusate sodium  100 mg Oral BID  . enoxaparin (LOVENOX) injection  40 mg Subcutaneous Q24H  . escitalopram  20 mg Oral Daily  . insulin aspart  0-6 Units Subcutaneous TID WC  . levothyroxine  75 mcg Oral Q breakfast  . oxybutynin  15 mg Oral QHS  . pantoprazole  40 mg Oral Daily  . traZODone  25 mg Oral QHS   Continuous: . sodium chloride 75 mL/hr at 03/28/20 0618  . ciprofloxacin 400 mg (03/28/20 3790)  . metronidazole 500 mg (03/28/20 0517)   WIO:[id number], lip balm, morphine injection, ondansetron **OR** ondansetron (ZOFRAN) IV, oxyCODONE   Objective:  Vital Signs  Vitals:   03/27/20 0621 03/27/20 1328 03/27/20 2106 03/28/20 0459  BP: 137/68 (!) 146/78 (!) 163/79 (!) 149/68  Pulse: 65 (!) 57 (!) 57 62  Resp: 14 16 17 16   Temp: 98 F (36.7 C) 98.1 F (36.7 C) 98.1 F (36.7 C) 99.1 F (37.3 C)  TempSrc: Oral Oral Oral Oral  SpO2: 90% 95% 92% 93%  Weight:        Intake/Output Summary (Last 24 hours) at 03/28/2020 0938 Last data filed at 03/28/2020 0300 Gross per 24 hour  Intake 2010.2 ml  Output --  Net 2010.2 ml   Filed Weights   03/26/20 1905  Weight: 67.9 kg    General appearance: Awake alert.  In no distress Resp: Clear to auscultation bilaterally.  Normal effort Cardio: S1-S2 is normal regular.  No S3-S4.  No rubs murmurs or bruit GI: Abdomen is soft.  Remains tender in the left lower quadrant without any rebound rigidity or guarding.  No masses organomegaly.  Bowel sounds present.   Extremities: No edema.  Full range of motion of lower extremities. Neurologic: Alert and oriented x3.  No focal neurological deficits.     Lab Results:  Data Reviewed: I have  personally reviewed following labs and imaging studies  CBC: Recent Labs  Lab 03/23/20 1037 03/26/20 0953 03/27/20 0518 03/28/20 0539  WBC 3.9 4.2 5.2 5.1  HGB 13.2 12.9 12.0 11.6*  HCT 39.6 39.7 36.4 36.4  MCV 84 85.9 85.2 86.7  PLT 156 138* 144* 147*    Basic Metabolic Panel: Recent Labs  Lab 03/23/20 1037 03/26/20 0953 03/27/20 0518 03/28/20 0539  NA 132* 134* 137 137  K 4.5 4.0 3.9 4.0  CL 98 100 104 104  CO2 22 25 24 24   GLUCOSE 147* 185* 112* 110*  BUN 7* 11 8 7*  CREATININE 0.89 0.90 0.80 0.85  CALCIUM 9.3 9.1 8.8* 8.7*  GFR: Estimated Creatinine Clearance: 51.3 mL/min (by C-G formula based on SCr of 0.85 mg/dL).  Liver Function Tests: Recent Labs  Lab 03/23/20 1037 03/26/20 0953 03/27/20 0518 03/28/20 0539  AST 252* 133* 102* 100*  ALT 369* 202* 155* 133*  ALKPHOS 85 73 68 71  BILITOT 1.4* 1.6* 1.2 0.9  PROT 7.0 7.7 6.5 6.5  ALBUMIN 4.0 3.7 3.2* 3.3*    Recent Labs  Lab 03/26/20 0953  LIPASE 23    CBG: Recent Labs  Lab 03/27/20 0737 03/27/20 1149 03/27/20 1612 03/27/20 2102 03/28/20 0736  GLUCAP 143* 125* 137* 104* 129*      Recent Results (from the past 240 hour(s))  SARS CORONAVIRUS 2 (TAT 6-24 HRS) Nasopharyngeal Nasopharyngeal Swab     Status: None   Collection Time: 03/26/20  2:48 PM   Specimen: Nasopharyngeal Swab  Result Value Ref Range Status   SARS Coronavirus 2 NEGATIVE NEGATIVE Final    Comment: (NOTE) SARS-CoV-2 target nucleic acids are NOT DETECTED.  The SARS-CoV-2 RNA is generally detectable in upper and lower respiratory specimens during the acute phase of infection. Negative results do not preclude SARS-CoV-2 infection, do not rule out co-infections with other pathogens, and should not be used as the sole basis for treatment or other patient management decisions. Negative results must be combined with clinical observations, patient history, and epidemiological information. The expected result is  Negative.  Fact Sheet for Patients: SugarRoll.be  Fact Sheet for Healthcare Providers: https://www.woods-mathews.com/  This test is not yet approved or cleared by the [place] FDA and  has been authorized for detection and/or diagnosis of SARS-CoV-2 by FDA under an Emergency Use Authorization (EUA). This EUA will remain  in effect (meaning this test can be used) for the duration of the COVID-19 declaration under Se ction 564(b)(1) of the Act, 21 U.S.C. section 360bbb-3(b)(1), unless the authorization is terminated or revoked sooner.  Performed at [facility], [place] [address], [place], [place] [address]       Radiology Studies: CT Abdomen Pelvis W Contrast  Result Date: 03/26/2020 CLINICAL DATA:  Abdominal distension.  Left lower quadrant pain. EXAM: CT ABDOMEN AND PELVIS WITH CONTRAST TECHNIQUE: Multidetector CT imaging of the abdomen and pelvis was performed using the standard protocol following bolus administration of intravenous contrast. CONTRAST:  127mL OMNIPAQUE IOHEXOL 300 MG/ML  SOLN COMPARISON:  March 16, 2020 FINDINGS: Lower chest: Small hiatal hernia. Hepatobiliary: No focal liver abnormality is seen. Status post cholecystectomy. No biliary dilatation. Pancreas: Unremarkable. No pancreatic ductal dilatation or surrounding inflammatory changes. Spleen: Normal in size without focal abnormality. Adrenals/Urinary Tract: Adrenal glands are unremarkable. Kidneys are normal, without renal calculi, focal lesion, or hydronephrosis. Bladder is unremarkable. Stomach/Bowel: Normal appearance of the stomach and small. Redundant colon. Extensive left colonic diverticulosis. Long segment of mucosal thickening in the distal descending and sigmoid colon with mild pericolonic inflammatory changes. Vascular/Lymphatic: Aortic atherosclerosis. No enlarged abdominal or pelvic lymph nodes. Reproductive: Status post hysterectomy. No adnexal masses.  Other: No abdominal wall hernia or abnormality. No abdominopelvic ascites. Musculoskeletal: Mild spondylosis of the lumbosacral spine. IMPRESSION: 1. Long segment of mucosal thickening of the distal descending and sigmoid colon with mild pericolonic inflammatory changes, on the background of extensive diverticulosis. Findings may represent acute diverticulitis or other form of colitis. No evidence of perforation or abscess formation. Colonic malignancy cannot be excluded, although is considered less likely with this appearance. 2. Small hiatal hernia. Aortic Atherosclerosis (ICD10-I70.0). Electronically Signed   By: Linwood Dibbles.D.  On: 03/26/2020 14:02       LOS: 2 days   [facility] Pager on [email or web address]  03/28/2020, 9:38 AM

## 2020-03-29 DIAGNOSIS — R7401 Elevation of levels of liver transaminase levels: Secondary | ICD-10-CM | POA: Diagnosis not present

## 2020-03-29 DIAGNOSIS — K5792 Diverticulitis of intestine, part unspecified, without perforation or abscess without bleeding: Secondary | ICD-10-CM | POA: Diagnosis not present

## 2020-03-29 LAB — GLUCOSE, CAPILLARY
Glucose-Capillary: 131 mg/dL — ABNORMAL HIGH (ref 70–99)
Glucose-Capillary: 146 mg/dL — ABNORMAL HIGH (ref 70–99)
Glucose-Capillary: 90 mg/dL (ref 70–99)
Glucose-Capillary: 142 mg/dL — ABNORMAL HIGH (ref 70–99)

## 2020-03-29 LAB — COMPREHENSIVE METABOLIC PANEL
ALT: 112 U/L — ABNORMAL HIGH (ref 0–44)
AST: 90 U/L — ABNORMAL HIGH (ref 15–41)
Albumin: 3.1 g/dL — ABNORMAL LOW (ref 3.5–5.0)
Alkaline Phosphatase: 69 U/L (ref 38–126)
Anion gap: 10 (ref 5–15)
BUN: 6 mg/dL — ABNORMAL LOW (ref 8–23)
CO2: 22 mmol/L (ref 22–32)
Calcium: 8.6 mg/dL — ABNORMAL LOW (ref 8.9–10.3)
Chloride: 107 mmol/L (ref 98–111)
Creatinine, Ser: 0.82 mg/dL (ref 0.44–1.00)
GFR, Estimated: >60 mL/min (ref >60)
Glucose, Bld: 117 mg/dL — ABNORMAL HIGH (ref 70–99)
Potassium: 3.7 mmol/L (ref 3.5–5.1)
Sodium: 139 mmol/L (ref 135–145)
Total Bilirubin: 0.9 mg/dL (ref 0.3–1.2)
Total Protein: 6.2 g/dL — ABNORMAL LOW (ref 6.5–8.1)

## 2020-03-29 LAB — CBC
HCT: 35.6 % — ABNORMAL LOW (ref 36.0–46.0)
Hemoglobin: 11.2 g/dL — ABNORMAL LOW (ref 12.0–15.0)
MCH: 28.1 pg (ref 26.0–34.0)
MCHC: 31.5 g/dL (ref 30.0–36.0)
MCV: 89.4 fL (ref 80.0–100.0)
Platelets: 139 10*3/uL — ABNORMAL LOW (ref 150–400)
RBC: 3.98 MIL/uL (ref 3.87–5.11)
RDW: 16.7 % — ABNORMAL HIGH (ref 11.5–15.5)
WBC: 5.2 10*3/uL (ref 4.0–10.5)
nRBC: 0 % (ref 0.0–0.2)

## 2020-03-29 MED ORDER — POTASSIUM CHLORIDE CRYS ER 20 MEQ PO TBCR
40.0000 meq | EXTENDED_RELEASE_TABLET | Freq: Once | ORAL | Status: AC
  Administered 2020-03-29: 40 meq via ORAL
  Filled 2020-03-29: qty 2

## 2020-03-29 MED ORDER — POLYETHYLENE GLYCOL 3350 17 G PO PACK
17.0000 g | PACK | Freq: Two times a day (BID) | ORAL | Status: DC
  Administered 2020-03-29 – 2020-03-30 (×3): 17 g via ORAL
  Filled 2020-03-29 (×3): qty 1

## 2020-03-29 NOTE — Progress Notes (Addendum)
[facility] Progress Note  Cheryl Cooper 80 y.o. 1940/09/20  CC:  Diverticulitis, abnormal LFTs  Subjective: Patient reports back pain but states she does not have abdominal pain unless someone presses on her abdomen.  She has not had a bowel movement for several days, despite dulcolax BID since Monday.  She was taking Miralax daily prior to admission.  Denies current nausea and requests chicken noodle soup.  ROS : Review of Systems  Cardiovascular: Negative for chest pain and palpitations.  Gastrointestinal: Positive for abdominal pain and constipation. Negative for blood in stool, diarrhea, heartburn, melena, nausea and vomiting.    Objective: Vital signs in last 24 hours: Vitals:   03/28/20 2129 03/29/20 0533  BP: (!) 171/81 (!) 163/77  Pulse: (!) 57 (!) 56  Resp: 16 16  Temp: 98.1 F (36.7 C) 98.3 F (36.8 C)  SpO2: 100% 95%    Physical Exam:  General:  Alert, oriented, cooperative, no distress, sitting comfortably in chair; husband at bedside  Head:  Normocephalic, without obvious abnormality, atraumatic  Eyes:  Anicteric sclera, EOMs intact  Lungs:   Clear to auscultation bilaterally, respirations unlabored  Heart:  Mildly bradycardic with regular rhythm, S1, S2 normal  Abdomen:   Soft but mildly distended with tenderness to palpation of LLQ, bowel sounds active all four quadrants, no guarding or peritoneal signs  Extremities: Extremities normal, atraumatic, no  edema  Pulses: 2+ and symmetric    Lab Results: Recent Labs    03/28/20 0539 03/29/20 0605  NA 137 139  K 4.0 3.7  CL 104 107  CO2 24 22  GLUCOSE 110* 117*  BUN 7* 6*  CREATININE 0.85 0.82  CALCIUM 8.7* 8.6*   Recent Labs    03/28/20 0539 03/29/20 0605  AST 100* 90*  ALT 133* 112*  ALKPHOS 71 69  BILITOT 0.9 0.9  PROT 6.5 6.2*  ALBUMIN 3.3* 3.1*   Recent Labs    03/28/20 0539 03/29/20 0605  WBC 5.1 5.2  HGB 11.6* 11.2*  HCT 36.4 35.6*  MCV 86.7 89.4  PLT 147* 139*   No  results for input(s): LABPROT, INR in the last 72 hours.   Assessment: Suspected diverticulitis: CT 2/6 revealed long segment of mucosal thickening of the distal descending and sigmoid colon with mild pericolonic inflammatory changes, on the background of extensive diverticulosis. -Patient reports normal colonoscopy within the last 2 years, we are awaiting the report, though I talked with staff at [facility] who read report, stated diverticulosis and one polyp but no other abnormalities. -No leukocytosis, WBCs 5.2  Abnormal LFTs: improving.  Possibly related to diverticulitis vs. Medication-induced (statin). Negative hepatitis panel.  No focal liver abnormality on CT. -AST 90/ ALT 112 (normal T. Bili, 0.9, normal ALP 69) -Mild thrombocytopenia: platelets 139 K/uL -PT/INR pending -Normal renal function: BUN 6/ Cr 0.82  Plan: Stop dulcolax and start Miralax BID.  Can increase to TID dosing if needed.  Soft diet OK.   Continue Cipro/Flagyl with plans to discharge on PO regimen, hopefully in the next 1-2 days if patient has a BM and symptoms improve.  No indication for inpatient colonoscopy, though consider repeat colonoscopy as an outpatient if/when acute symptoms resolve.  [facility] will follow.  Salley Slaughter PA-C 03/29/2020, 12:30 PM  Contact #  [phone]

## 2020-03-29 NOTE — Progress Notes (Signed)
[facility] PROGRESS NOTE   TALMA AGUILLARD ONG:[id number] DOB: Mar 31, 1940 DOA: 03/26/2020  PCP: Wendie Agreste, MD  Brief History/Interval Summary: 80 y.o. female with medical history significant of anxiety, HTN, diet controlled DM2. Presenting with abdominal distention and pain.  Treated in the outpatient setting for colitis versus diverticulitis.  She was being treated with Augmentin.  Was also found to have elevated LFTs.  Improvement in her symptoms she was asked to come to the emergency department.  She was again found to have findings suggestive of colitis versus diverticulitis.  She was hospitalized for further management.  Patient does not report much improvement in her abdominal pain as of today.  Reason for Visit: Acute diverticulitis versus acute colitis  Consultants: [facility]  Procedures: None yet  Antibiotics: Anti-infectives (From admission, onward)   Start     Dose/Rate Route Frequency Ordered Stop   03/27/20 0600  ciprofloxacin (CIPRO) IVPB 400 mg        400 mg 200 mL/hr over 60 Minutes Intravenous Every 12 hours 03/26/20 1836     03/26/20 2200  metroNIDAZOLE (FLAGYL) IVPB 500 mg        500 mg 100 mL/hr over 60 Minutes Intravenous Every 8 hours 03/26/20 1836     03/26/20 1445  ciprofloxacin (CIPRO) IVPB 400 mg        400 mg 200 mL/hr over 60 Minutes Intravenous  Once 03/26/20 1444 03/26/20 1615   03/26/20 1445  metroNIDAZOLE (FLAGYL) IVPB 500 mg        500 mg 100 mL/hr over 60 Minutes Intravenous  Once 03/26/20 1444 03/26/20 1515      Subjective/Interval History: Patient continues to have left-sided abdominal pain but not severe.  Continues to have nausea even with clear liquids.  No vomiting.    Assessment/Plan:  Acute diverticulitis versus colitis Patient has had 2 CT scan of her abdomen and pelvis within the last 2 weeks.  Patient with known history of extensive diverticulosis.  Concern is for diverticulitis versus colitis.   Patient has not had any diarrhea associated with her acute illness.  This was felt to be acute diverticulitis..  Outpatient treatment with Augmentin.  Started on Cipro and Flagyl intravenously in the hospital.  Due to lack of improvement gastroenterology was consulted. Patient was on clear liquid diet.  Advance to full liquids yesterday by GI. Rona Ravens request colonoscopy report from her outpatient gastroenterologist, Dr. Liane Comber [facility]).  Transaminitis Reason for abnormal LFTs is not entirely clear.  CT scan did not show any obvious area of concern in the hepatobiliary system.  Patient is status post cholecystectomy.  Bilirubin is normal.  Alkaline phosphatase is normal.   Could have been drug-induced.  Patient noted to be on statin.  This has been held.  Augmentin can cause hepatitis.   Hepatitis panel unremarkable. LFTs are trending downwards slowly.  Essential hypertension Continue amlodipine.  Thrombocytopenia Mild.  Seems to be stable.  Continue to monitor periodically.  Diabetes mellitus type 2, diet-controlled Last HbA1c was 6.7 in December.  CBGs are reasonably well controlled.  Continue SSI.  Hypothyroidism Continue Synthroid.  Hyperlipidemia Holding statin due to abnormal LFTs.  History of anxiety disorder Resume her other home medications.    DVT Prophylaxis: Lovenox Code Status: Full code Family Communication: Discussed with patient Disposition Plan: Hopefully return home when improved  Status is: Inpatient  Remains inpatient appropriate because:Ongoing active pain requiring inpatient pain management, IV treatments appropriate due to intensity  of illness or inability to take PO and Inpatient level of care appropriate due to severity of illness   Dispo: The patient is from: Home              Anticipated d/c is to: Home              Anticipated d/c date is: 2 days              Patient currently is not medically stable to  d/c.   Difficult to place patient No      Medications:  Scheduled: . amLODipine  5 mg Oral Daily  . docusate sodium  100 mg Oral BID  . enoxaparin (LOVENOX) injection  40 mg Subcutaneous Q24H  . escitalopram  20 mg Oral Daily  . insulin aspart  0-6 Units Subcutaneous TID WC  . levothyroxine  75 mcg Oral Q breakfast  . oxybutynin  15 mg Oral QHS  . pantoprazole  40 mg Oral Daily  . traZODone  25 mg Oral QHS   Continuous: . sodium chloride 75 mL/hr at 03/29/20 0826  . ciprofloxacin Stopped (03/29/20 0748)  . metronidazole Stopped (03/29/20 7782)   UMP:NTIRWERXVQM, lip balm, morphine injection, ondansetron **OR** ondansetron (ZOFRAN) IV, oxyCODONE   Objective:  Vital Signs  Vitals:   03/28/20 0459 03/28/20 1322 03/28/20 2129 03/29/20 0533  BP: (!) 149/68 (!) 150/72 (!) 171/81 (!) 163/77  Pulse: 62 64 (!) 57 (!) 56  Resp: 16 16 16 16   Temp: 99.1 F (37.3 C) 98.9 F (37.2 C) 98.1 F (36.7 C) 98.3 F (36.8 C)  TempSrc: Oral Oral Oral Oral  SpO2: 93% 95% 100% 95%  Weight:        Intake/Output Summary (Last 24 hours) at 03/29/2020 1056 Last data filed at 03/29/2020 0826 Gross per 24 hour  Intake 2581.12 ml  Output 1100 ml  Net 1481.12 ml   Filed Weights   03/26/20 1905  Weight: 67.9 kg    General appearance: Awake alert.  In no distress Resp: Clear to auscultation bilaterally.  Normal effort Cardio: S1-S2 is normal regular.  No S3-S4.  No rubs murmurs or bruit GI: Abdomen is soft.  Mildly tender in the left lower quadrant without any rebound rigidity or guarding.  No masses organomegaly.  Bowel sounds present normal.  Extremities: No edema.  Full range of motion of lower extremities. Neurologic: Alert and oriented x3.  No focal neurological deficits.      Lab Results:  Data Reviewed: I have personally reviewed following labs and imaging studies  CBC: Recent Labs  Lab 03/23/20 1037 03/26/20 0953 03/27/20 0518 03/28/20 0539 03/29/20 0605  WBC 3.9 4.2  5.2 5.1 5.2  HGB 13.2 12.9 12.0 11.6* 11.2*  HCT 39.6 39.7 36.4 36.4 35.6*  MCV 84 85.9 85.2 86.7 89.4  PLT 156 138* 144* 147* 139*    Basic Metabolic Panel: Recent Labs  Lab 03/23/20 1037 03/26/20 0953 03/27/20 0518 03/28/20 0539 03/29/20 0605  NA 132* 134* 137 137 139  K 4.5 4.0 3.9 4.0 3.7  CL 98 100 104 104 107  CO2 22 25 24 24 22   GLUCOSE 147* 185* 112* 110* 117*  BUN 7* 11 8 7* 6*  CREATININE 0.89 0.90 0.80 0.85 0.82  CALCIUM 9.3 9.1 8.8* 8.7* 8.6*    GFR: Estimated Creatinine Clearance: 53.2 mL/min (by C-G formula based on SCr of 0.82 mg/dL).  Liver Function Tests: Recent Labs  Lab 03/23/20 1037 03/26/20 0953 03/27/20 0518 03/28/20 0539 03/29/20  0605  AST 252* 133* 102* 100* 90*  ALT 369* 202* 155* 133* 112*  ALKPHOS 85 73 68 71 69  BILITOT 1.4* 1.6* 1.2 0.9 0.9  PROT 7.0 7.7 6.5 6.5 6.2*  ALBUMIN 4.0 3.7 3.2* 3.3* 3.1*    Recent Labs  Lab 03/26/20 0953  LIPASE 23    CBG: Recent Labs  Lab 03/28/20 0736 03/28/20 1251 03/28/20 1622 03/28/20 2126 03/29/20 0734  GLUCAP 129* 116* 145* 108* 131*      Recent Results (from the past 240 hour(s))  SARS CORONAVIRUS 2 (TAT 6-24 HRS) Nasopharyngeal Nasopharyngeal Swab     Status: None   Collection Time: 03/26/20  2:48 PM   Specimen: Nasopharyngeal Swab  Result Value Ref Range Status   SARS Coronavirus 2 NEGATIVE NEGATIVE Final    Comment: (NOTE) SARS-CoV-2 target nucleic acids are NOT DETECTED.  The SARS-CoV-2 RNA is generally detectable in upper and lower respiratory specimens during the acute phase of infection. Negative results do not preclude SARS-CoV-2 infection, do not rule out co-infections with other pathogens, and should not be used as the sole basis for treatment or other patient management decisions. Negative results must be combined with clinical observations, patient history, and epidemiological information. The expected result is Negative.  Fact Sheet for  Patients: SugarRoll.be  Fact Sheet for Healthcare Providers: https://www.woods-mathews.com/  This test is not yet approved or cleared by the [place] FDA and  has been authorized for detection and/or diagnosis of SARS-CoV-2 by FDA under an Emergency Use Authorization (EUA). This EUA will remain  in effect (meaning this test can be used) for the duration of the COVID-19 declaration under Se ction 564(b)(1) of the Act, 21 U.S.C. section 360bbb-3(b)(1), unless the authorization is terminated or revoked sooner.  Performed at [facility], [place] [address], [place], [place] [address]       Radiology Studies: No results found.     LOS: 3 days   Cailie Bosshart [employer] on [email or web address]  03/29/2020, 10:56 AM

## 2020-03-29 NOTE — Op Note (Signed)
Colonoscopy 10/2016: diverticulosis, one 36mm poylp, internal hemorrhoids

## 2020-03-29 NOTE — Care Management Important Message (Signed)
Important Message  Patient Details IM Letter given to the Patient. Name: Cheryl Cooper MRN: [id number] Date of Birth: 1940-02-27   Medicare Important Message Given:  Yes     Kerin Salen 03/29/2020, 1:34 PM

## 2020-03-30 ENCOUNTER — Telehealth: Payer: Medicare PPO | Admitting: Family Medicine

## 2020-03-30 DIAGNOSIS — K5792 Diverticulitis of intestine, part unspecified, without perforation or abscess without bleeding: Secondary | ICD-10-CM | POA: Diagnosis not present

## 2020-03-30 DIAGNOSIS — R7401 Elevation of levels of liver transaminase levels: Secondary | ICD-10-CM | POA: Diagnosis not present

## 2020-03-30 LAB — COMPREHENSIVE METABOLIC PANEL
ALT: 114 U/L — ABNORMAL HIGH (ref 0–44)
AST: 93 U/L — ABNORMAL HIGH (ref 15–41)
Albumin: 3.6 g/dL (ref 3.5–5.0)
Alkaline Phosphatase: 82 U/L (ref 38–126)
Anion gap: 10 (ref 5–15)
BUN: 6 mg/dL — ABNORMAL LOW (ref 8–23)
CO2: 25 mmol/L (ref 22–32)
Calcium: 9.3 mg/dL (ref 8.9–10.3)
Chloride: 104 mmol/L (ref 98–111)
Creatinine, Ser: 0.92 mg/dL (ref 0.44–1.00)
GFR, Estimated: >60 mL/min (ref >60)
Glucose, Bld: 141 mg/dL — ABNORMAL HIGH (ref 70–99)
Potassium: 3.9 mmol/L (ref 3.5–5.1)
Sodium: 139 mmol/L (ref 135–145)
Total Bilirubin: 0.8 mg/dL (ref 0.3–1.2)
Total Protein: 7 g/dL (ref 6.5–8.1)

## 2020-03-30 LAB — GLUCOSE, CAPILLARY
Glucose-Capillary: 107 mg/dL — ABNORMAL HIGH (ref 70–99)
Glucose-Capillary: 128 mg/dL — ABNORMAL HIGH (ref 70–99)
Glucose-Capillary: 115 mg/dL — ABNORMAL HIGH (ref 70–99)
Glucose-Capillary: 138 mg/dL — ABNORMAL HIGH (ref 70–99)

## 2020-03-30 LAB — CBC
HCT: 39.3 % (ref 36.0–46.0)
Hemoglobin: 12.6 g/dL (ref 12.0–15.0)
MCH: 27.8 pg (ref 26.0–34.0)
MCHC: 32.1 g/dL (ref 30.0–36.0)
MCV: 86.6 fL (ref 80.0–100.0)
Platelets: 162 10*3/uL (ref 150–400)
RBC: 4.54 MIL/uL (ref 3.87–5.11)
RDW: 16.6 % — ABNORMAL HIGH (ref 11.5–15.5)
WBC: 5.7 10*3/uL (ref 4.0–10.5)
nRBC: 0 % (ref 0.0–0.2)

## 2020-03-30 LAB — PROTIME-INR
INR: 1 (ref 0.8–1.2)
Prothrombin Time: 13.1 seconds (ref 11.4–15.2)

## 2020-03-30 MED ORDER — SENNA 8.6 MG PO TABS
2.0000 | ORAL_TABLET | Freq: Two times a day (BID) | ORAL | Status: DC
  Administered 2020-03-30 – 2020-03-31 (×3): 17.2 mg via ORAL
  Filled 2020-03-30 (×3): qty 2

## 2020-03-30 MED ORDER — NYSTATIN 100000 UNIT/ML MT SUSP
5.0000 mL | Freq: Four times a day (QID) | OROMUCOSAL | Status: DC
  Administered 2020-03-30 – 2020-03-31 (×4): 500000 [IU] via ORAL
  Filled 2020-03-30 (×4): qty 5

## 2020-03-30 MED ORDER — POLYETHYLENE GLYCOL 3350 17 G PO PACK
17.0000 g | PACK | Freq: Three times a day (TID) | ORAL | Status: DC
  Administered 2020-03-30 – 2020-03-31 (×3): 17 g via ORAL
  Filled 2020-03-30 (×4): qty 1

## 2020-03-30 NOTE — Progress Notes (Signed)
[facility] PROGRESS NOTE   ARAH ARO YQM:[id number] DOB: [birth year] DOA: 03/26/2020  PCP: Wendie Agreste, MD  Brief History/Interval Summary: 80 y.o. female with medical history significant of anxiety, HTN, diet controlled DM2. Presenting with abdominal distention and pain.  Treated in the outpatient setting for colitis versus diverticulitis.  She was being treated with Augmentin.  Was also found to have elevated LFTs.  Improvement in her symptoms she was asked to come to the emergency department.  She was again found to have findings suggestive of colitis versus diverticulitis.  She was hospitalized for further management.  Gastroenterology was consulted.  Patient is slow to improve.  Reason for Visit: Acute diverticulitis  Consultants: [facility]  Procedures: None yet  Antibiotics: Anti-infectives (From admission, onward)   Start     Dose/Rate Route Frequency Ordered Stop   03/27/20 0600  ciprofloxacin (CIPRO) IVPB 400 mg        400 mg 200 mL/hr over 60 Minutes Intravenous Every 12 hours 03/26/20 1836     03/26/20 2200  metroNIDAZOLE (FLAGYL) IVPB 500 mg        500 mg 100 mL/hr over 60 Minutes Intravenous Every 8 hours 03/26/20 1836     03/26/20 1445  ciprofloxacin (CIPRO) IVPB 400 mg        400 mg 200 mL/hr over 60 Minutes Intravenous  Once 03/26/20 1444 03/26/20 1615   03/26/20 1445  metroNIDAZOLE (FLAGYL) IVPB 500 mg        500 mg 100 mL/hr over 60 Minutes Intravenous  Once 03/26/20 1444 03/26/20 1515      Subjective/Interval History: Patient continues to have abdominal pain in the left and right lower quadrants.  Some nausea but that seems to be improving.  Still has not had a bowel movement.  Passing some gas from below.     Assessment/Plan:  Acute diverticulitis Patient has had 2 CT scan of her abdomen and pelvis within the last 2 weeks.  Patient with known history of extensive diverticulosis.  Concern is for diverticulitis versus  colitis.  Patient has not had any diarrhea associated with her acute illness.  This was felt to be acute diverticulitis.  Outpatient treatment with Augmentin.  Started on Cipro and Flagyl intravenously in the hospital.  Due to lack of improvement gastroenterology was consulted.  GI also thinks that this is acute diverticulitis.  They did not have any further recommendations.  Her last colonoscopy report was obtained from her outpatient gastroenterology office.  It was done in September 2018.  Showed internal hemorrhoids, diverticulosis.  A polyp was removed. Patient still has not had a bowel movement.  Constipation could also be causing some of her symptoms.  She was started on laxatives by GI yesterday.  We will continue for now.  Add Senokot.  Change to oral antibiotics.  Hopefully she will have a bowel movement soon and then she can be discharged home in the next 24 hours or so. Her outpatient gastroenterologist, Dr. Liane Comber with [facility]).  Transaminitis Reason for abnormal LFTs is not entirely clear.  CT scan did not show any obvious area of concern in the hepatobiliary system.  Patient is status post cholecystectomy.  Bilirubin is normal.  Alkaline phosphatase is normal.   Could have been drug-induced.  Patient noted to be on statin.  This has been held.  Augmentin can cause hepatitis.   Hepatitis panel unremarkable. LFTs are stable.  Elevated LFTs also noted in 2019 per GI.  Outpatient monitoring.  Essential hypertension Continue amlodipine.  Thrombocytopenia Resolved.  Diabetes mellitus type 2, diet-controlled Last HbA1c was 6.7 in December.  CBGs are reasonably well controlled.  Continue SSI.  Hypothyroidism Continue Synthroid.  Hyperlipidemia Holding statin due to abnormal LFTs.  History of anxiety disorder Resume her other home medications.  Systolic murmur Noted in the aortic area.  Is being followed by cardiology for same.  Plan was to  do outpatient echocardiogram in the next day or so.  Will order here in the hospital since she will likely miss that appointment.    DVT Prophylaxis: Lovenox Code Status: Full code Family Communication: Discussed with patient Disposition Plan: Hopefully return home when improved  Status is: Inpatient  Remains inpatient appropriate because:Ongoing active pain requiring inpatient pain management, IV treatments appropriate due to intensity of illness or inability to take PO and Inpatient level of care appropriate due to severity of illness   Dispo: The patient is from: Home              Anticipated d/c is to: Home              Anticipated d/c date is: 2 days              Patient currently is not medically stable to d/c.   Difficult to place patient No      Medications:  Scheduled: . amLODipine  5 mg Oral Daily  . enoxaparin (LOVENOX) injection  40 mg Subcutaneous Q24H  . escitalopram  20 mg Oral Daily  . insulin aspart  0-6 Units Subcutaneous TID WC  . levothyroxine  75 mcg Oral Q breakfast  . nystatin  5 mL Oral QID  . oxybutynin  15 mg Oral QHS  . pantoprazole  40 mg Oral Daily  . polyethylene glycol  17 g Oral BID  . senna  2 tablet Oral BID  . traZODone  25 mg Oral QHS   Continuous: . sodium chloride Stopped (03/29/20 1421)  . ciprofloxacin 400 mg (03/30/20 0528)  . metronidazole 500 mg (03/30/20 0516)   BPZ:WCHENIDPOEU, lip balm, morphine injection, ondansetron **OR** ondansetron (ZOFRAN) IV, oxyCODONE   Objective:  Vital Signs  Vitals:   03/29/20 1354 03/29/20 1435 03/29/20 2129 03/30/20 0509  BP: 132/70  (!) 153/70 (!) 185/85  Pulse: (!) 55 (!) 57 (!) 57 (!) 59  Resp: 16  14 16   Temp: 98.1 F (36.7 C)  99.1 F (37.3 C) 98.1 F (36.7 C)  TempSrc: Oral  Oral Oral  SpO2: 91% 96% 92% 92%  Weight:        Intake/Output Summary (Last 24 hours) at 03/30/2020 1237 Last data filed at 03/30/2020 0900 Gross per 24 hour  Intake 1964.85 ml  Output --  Net  1964.85 ml   Filed Weights   03/26/20 1905  Weight: 67.9 kg    General appearance: Awake alert.  In no distress Resp: Clear to auscultation bilaterally.  Normal effort Cardio: S1-S2 is normal regular.  No S3-S4.  Systolic murmur appreciated over the precordium GI: Abdomen is soft.  Tenderness noted in both lower quadrants.  No rebound rigidity or guarding.  No masses organomegaly.  Bowel sounds present  Extremities: No edema.  Full range of motion of lower extremities. Neurologic: Alert and oriented x3.  No focal neurological deficits.     Lab Results:  Data Reviewed: I have personally reviewed following labs and imaging studies  CBC: Recent Labs  Lab 03/26/20 0953 03/27/20 0518 03/28/20 0539 03/29/20  3903 03/30/20 0624  WBC 4.2 5.2 5.1 5.2 5.7  HGB 12.9 12.0 11.6* 11.2* 12.6  HCT 39.7 36.4 36.4 35.6* 39.3  MCV 85.9 85.2 86.7 89.4 86.6  PLT 138* 144* 147* 139* 009    Basic Metabolic Panel: Recent Labs  Lab 03/26/20 0953 03/27/20 0518 03/28/20 0539 03/29/20 0605 03/30/20 0624  NA 134* 137 137 139 139  K 4.0 3.9 4.0 3.7 3.9  CL 100 104 104 107 104  CO2 25 24 24 22 25   GLUCOSE 185* 112* 110* 117* 141*  BUN 11 8 7* 6* 6*  CREATININE 0.90 0.80 0.85 0.82 0.92  CALCIUM 9.1 8.8* 8.7* 8.6* 9.3    GFR: Estimated Creatinine Clearance: 47.4 mL/min (by C-G formula based on SCr of 0.92 mg/dL).  Liver Function Tests: Recent Labs  Lab 03/26/20 0953 03/27/20 0518 03/28/20 0539 03/29/20 0605 03/30/20 0624  AST 133* 102* 100* 90* 93*  ALT 202* 155* 133* 112* 114*  ALKPHOS 73 68 71 69 82  BILITOT 1.6* 1.2 0.9 0.9 0.8  PROT 7.7 6.5 6.5 6.2* 7.0  ALBUMIN 3.7 3.2* 3.3* 3.1* 3.6    Recent Labs  Lab 03/26/20 0953  LIPASE 23    CBG: Recent Labs  Lab 03/29/20 1205 03/29/20 1645 03/29/20 2126 03/30/20 0735 03/30/20 1135  GLUCAP 146* 90 142* 107* 128*      Recent Results (from the past 240 hour(s))  SARS CORONAVIRUS 2 (TAT 6-24 HRS) Nasopharyngeal  Nasopharyngeal Swab     Status: None   Collection Time: 03/26/20  2:48 PM   Specimen: Nasopharyngeal Swab  Result Value Ref Range Status   SARS Coronavirus 2 NEGATIVE NEGATIVE Final    Comment: (NOTE) SARS-CoV-2 target nucleic acids are NOT DETECTED.  The SARS-CoV-2 RNA is generally detectable in upper and lower respiratory specimens during the acute phase of infection. Negative results do not preclude SARS-CoV-2 infection, do not rule out co-infections with other pathogens, and should not be used as the sole basis for treatment or other patient management decisions. Negative results must be combined with clinical observations, patient history, and epidemiological information. The expected result is Negative.  Fact Sheet for Patients: SugarRoll.be  Fact Sheet for Healthcare Providers: https://www.woods-mathews.com/  This test is not yet approved or cleared by the [place] FDA and  has been authorized for detection and/or diagnosis of SARS-CoV-2 by FDA under an Emergency Use Authorization (EUA). This EUA will remain  in effect (meaning this test can be used) for the duration of the COVID-19 declaration under Se ction 564(b)(1) of the Act, 21 U.S.C. section 360bbb-3(b)(1), unless the authorization is terminated or revoked sooner.  Performed at [facility], [place] [address], [place], [place] [address]       Radiology Studies: No results found.     LOS: 4 days   Kanna Dafoe [employer] on [email or web address]  03/30/2020, 12:37 PM

## 2020-03-31 ENCOUNTER — Inpatient Hospital Stay (HOSPITAL_COMMUNITY): Payer: Medicare PPO

## 2020-03-31 DIAGNOSIS — R011 Cardiac murmur, unspecified: Secondary | ICD-10-CM

## 2020-03-31 DIAGNOSIS — I350 Nonrheumatic aortic (valve) stenosis: Secondary | ICD-10-CM | POA: Diagnosis not present

## 2020-03-31 LAB — CBC
HCT: 35.6 % — ABNORMAL LOW (ref 36.0–46.0)
Hemoglobin: 11.4 g/dL — ABNORMAL LOW (ref 12.0–15.0)
MCH: 28.4 pg (ref 26.0–34.0)
MCHC: 32 g/dL (ref 30.0–36.0)
MCV: 88.8 fL (ref 80.0–100.0)
Platelets: 153 10*3/uL (ref 150–400)
RBC: 4.01 MIL/uL (ref 3.87–5.11)
RDW: 16.7 % — ABNORMAL HIGH (ref 11.5–15.5)
WBC: 5.6 10*3/uL (ref 4.0–10.5)
nRBC: 0 % (ref 0.0–0.2)

## 2020-03-31 LAB — COMPREHENSIVE METABOLIC PANEL
ALT: 85 U/L — ABNORMAL HIGH (ref 0–44)
AST: 72 U/L — ABNORMAL HIGH (ref 15–41)
Albumin: 3.1 g/dL — ABNORMAL LOW (ref 3.5–5.0)
Alkaline Phosphatase: 69 U/L (ref 38–126)
Anion gap: 9 (ref 5–15)
BUN: 5 mg/dL — ABNORMAL LOW (ref 8–23)
CO2: 22 mmol/L (ref 22–32)
Calcium: 8.9 mg/dL (ref 8.9–10.3)
Chloride: 106 mmol/L (ref 98–111)
Creatinine, Ser: 0.8 mg/dL (ref 0.44–1.00)
GFR, Estimated: >60 mL/min (ref >60)
Glucose, Bld: 124 mg/dL — ABNORMAL HIGH (ref 70–99)
Potassium: 3.7 mmol/L (ref 3.5–5.1)
Sodium: 137 mmol/L (ref 135–145)
Total Bilirubin: 0.7 mg/dL (ref 0.3–1.2)
Total Protein: 6.3 g/dL — ABNORMAL LOW (ref 6.5–8.1)

## 2020-03-31 LAB — GLUCOSE, CAPILLARY
Glucose-Capillary: 97 mg/dL (ref 70–99)
Glucose-Capillary: 124 mg/dL — ABNORMAL HIGH (ref 70–99)

## 2020-03-31 LAB — ECHOCARDIOGRAM COMPLETE
AR max vel: 1.05 cm2
AV Area VTI: 1.03 cm2
AV Area mean vel: 1.01 cm2
AV Mean grad: 32.5 mmHg
AV Peak grad: 52.8 mmHg
Ao pk vel: 3.63 m/s
Area-P 1/2: 1.84 cm2
Height: 63 in
S' Lateral: 1.9 cm
Weight: 2395.08 oz

## 2020-03-31 MED ORDER — MAGNESIUM CITRATE PO SOLN
1.0000 | Freq: Once | ORAL | Status: AC
  Administered 2020-03-31: 1 via ORAL
  Filled 2020-03-31: qty 296

## 2020-03-31 MED ORDER — OXYCODONE HCL 5 MG PO TABS: 5.0000 mg | ORAL_TABLET | Freq: Four times a day (QID) | ORAL | 0 refills | Status: DC | PRN

## 2020-03-31 MED ORDER — NYSTATIN 100000 UNIT/ML MT SUSP: 5.0000 mL | Freq: Four times a day (QID) | OROMUCOSAL | 0 refills | Status: DC

## 2020-03-31 MED ORDER — POLYETHYLENE GLYCOL 3350 17 G PO PACK: PACK | ORAL | 0 refills | Status: DC

## 2020-03-31 MED ORDER — METRONIDAZOLE 500 MG PO TABS
500.0000 mg | ORAL_TABLET | Freq: Three times a day (TID) | ORAL | Status: DC
  Administered 2020-03-31: 500 mg via ORAL
  Filled 2020-03-31: qty 1

## 2020-03-31 MED ORDER — CIPROFLOXACIN HCL 500 MG PO TABS: 500.0000 mg | ORAL_TABLET | Freq: Two times a day (BID) | ORAL | Status: DC

## 2020-03-31 MED ORDER — CIPROFLOXACIN HCL 500 MG PO TABS: 500.0000 mg | ORAL_TABLET | Freq: Two times a day (BID) | ORAL | 0 refills | Status: AC

## 2020-03-31 MED ORDER — METRONIDAZOLE 500 MG PO TABS: 500.0000 mg | ORAL_TABLET | Freq: Three times a day (TID) | ORAL | 0 refills | Status: AC

## 2020-03-31 NOTE — Progress Notes (Signed)
Pt left the unit in stable condition with spouse. All belongings accounted for. AVS reviewed.

## 2020-03-31 NOTE — Discharge Instructions (Signed)
Diverticulitis  Diverticulitis is when small pouches in your colon (large intestine) get infected or swollen. This causes pain in the belly (abdomen) and watery poop (diarrhea). These pouches are called diverticula. The pouches form in people who have a condition called diverticulosis. What are the causes? This condition may be caused by poop (stool) that gets trapped in the pouches in your colon. The poop lets germs (bacteria) grow in the pouches. This causes the infection. What increases the risk? You are more likely to get this condition if you have small pouches in your colon. The risk is higher if:  You are overweight or very overweight (obese).  You do not exercise enough.  You drink alcohol.  You smoke or use products with tobacco in them.  You eat a diet that has a lot of red meat such as beef, pork, or lamb.  You eat a diet that does not have enough fiber in it.  You are older than 78 years of age. What are the signs or symptoms?  Pain in the belly. Pain is often on the left side, but it may be in other areas.  Fever and feeling cold.  Feeling like you may vomit.  Vomiting.  Having cramps.  Feeling full.  Changes to how often you poop.  Blood in your poop. How is this treated? Most cases are treated at home by:  Taking over-the-counter pain medicines.  Following a clear liquid diet.  Taking antibiotic medicines.  Resting. Very bad cases may need to be treated at a hospital. This may include:  Not eating or drinking.  Taking prescription pain medicine.  Getting antibiotic medicines through an IV tube.  Getting fluid and food through an IV tube.  Having surgery. When you are feeling better, your doctor may tell you to have a test to check your colon (colonoscopy). Follow these instructions at home: Medicines  Take over-the-counter and prescription medicines only as told by your doctor. These include: ? Antibiotics. ? Pain medicines. ? Fiber  pills. ? Probiotics. ? Stool softeners.  If you were prescribed an antibiotic medicine, take it as told by your doctor. Do not stop taking the antibiotic even if you start to feel better.  Ask your doctor if the medicine prescribed to you requires you to avoid driving or using machinery. Eating and drinking  Follow a diet as told by your doctor.  When you feel better, your doctor may tell you to change your diet. You may need to eat a lot of fiber. Fiber makes it easier to poop (have a bowel movement). Foods with fiber include: ? Berries. ? Beans. ? Lentils. ? Green vegetables.  Avoid eating red meat.   General instructions  Do not use any products that contain nicotine or tobacco, such as cigarettes, e-cigarettes, and chewing tobacco. If you need help quitting, ask your doctor.  Exercise 3 or more times a week. Try to get 30 minutes each time. Exercise enough to sweat and make your heart beat faster.  Keep all follow-up visits as told by your doctor. This is important. Contact a doctor if:  Your pain does not get better.  You are not pooping like normal. Get help right away if:  Your pain gets worse.  Your symptoms do not get better.  Your symptoms get worse very fast.  You have a fever.  You vomit more than one time.  You have poop that is: ? Bloody. ? Black. ? Tarry. Summary  This condition happens when   small pouches in your colon get infected or swollen.  Take medicines only as told by your doctor.  Follow a diet as told by your doctor.  Keep all follow-up visits as told by your doctor. This is important. This information is not intended to replace advice given to you by your health care provider. Make sure you discuss any questions you have with your health care provider. Document Revised: 11/16/2018 Document Reviewed: 11/16/2018 Elsevier Patient Education  2021 [employer]  

## 2020-03-31 NOTE — Progress Notes (Signed)
  Echocardiogram 2D Echocardiogram has been performed.  Cheryl Cooper 03/31/2020, 11:54 AM

## 2020-03-31 NOTE — Care Management Important Message (Signed)
Medicare IM printed for Cheryl Cooper, NCM to give to the patient. 

## 2020-03-31 NOTE — Discharge Summary (Signed)
[facility]  Physician Discharge Summary   Patient ID: Cheryl Cooper MRN: [id number] DOB/AGE: [birth year] 80 y.o.  Admit date: 03/26/2020 Discharge date: 03/31/2020  PCP: Wendie Agreste, MD  DISCHARGE DIAGNOSES:  Acute diverticulitis, improved Constipation, resolved Transaminitis unclear etiology Essential hypertension Thrombocytopenia resolved Diet-controlled diabetes mellitus type 2 Hypothyroidism Moderate aortic stenosis  RECOMMENDATIONS FOR OUTPATIENT FOLLOW UP: 1. Follow-up with her primary care provider in 1 week 2. Will need to have liver function tests checked at follow-up. 3. Statin and Zetia placed on hold    Home Health: None Equipment/Devices: None  CODE STATUS: Full code  DISCHARGE CONDITION: fair  Diet recommendation: As before  INITIAL HISTORY: 80 y.o.femalewith medical history significant ofanxiety, HTN, diet controlled DM2. Presenting with abdominal distention and pain.  Treated in the outpatient setting for colitis versus diverticulitis.  She was being treated with Augmentin.  Was also found to have elevated LFTs.  Improvement in her symptoms she was asked to come to the emergency department.  She was again found to have findings suggestive of colitis versus diverticulitis.  She was hospitalized for further management.  Consultations:  [facility]  Procedures:  Echocardiogram 2/11 1. Left ventricular ejection fraction, by estimation, is >75%. The left  ventricle has hyperdynamic function. The left ventricle has no regional  wall motion abnormalities. There is mild left ventricular hypertrophy.  Left ventricular diastolic parameters  are consistent with Grade I diastolic dysfunction (impaired relaxation).  Elevated left ventricular end-diastolic pressure. The E/e' is 98.  2. Right ventricular systolic function is normal. The right ventricular  size is normal. Tricuspid regurgitation signal is inadequate for assessing  PA  pressure.  3. The mitral valve is abnormal. Trivial mitral valve regurgitation. No  evidence of mitral stenosis. Moderate mitral annular calcification.  4. The aortic valve is calcified. Aortic valve regurgitation is not  visualized. Moderate aortic valve stenosis. Aortic valve area, by VTI  measures 1.03 cm. Aortic valve mean gradient measures 32.5 mmHg. Aortic  valve Vmax measures 3.63 m/s. DI is 0.40.  5. The inferior vena cava is normal in size with greater than 50%  respiratory variability, suggesting right atrial pressure of 3 mmHg.    HOSPITAL COURSE:   Acute diverticulitis Patient has had 2 CT scan of her abdomen and pelvis within the last 2 weeks.  Patient with known history of extensive diverticulosis.  Concern is for diverticulitis versus colitis.  Patient has not had any diarrhea associated with her acute illness.  This was felt to be acute diverticulitis.  Patient failed outpatient treatment with Augmentin.  Started on Cipro and Flagyl intravenously in the hospital.  Due to lack of improvement gastroenterology was consulted.  GI also thinks that this is acute diverticulitis.  Her last colonoscopy report was obtained from her outpatient gastroenterology office.  It was done in September 2018.  Showed internal hemorrhoids, diverticulosis.  A polyp was removed.  GI recommended continuing current antibiotics.  Constipation may also be contributing to her symptoms. Patient was started on laxatives.  Subsequently given magnesium citrate with multiple bowel movements. Patient will be discharged on oral ciprofloxacin and metronidazole.  Transaminitis Reason for abnormal LFTs is not entirely clear.  CT scan did not show any obvious area of concern in the hepatobiliary system.  Patient is status post cholecystectomy.  Bilirubin is normal.  Alkaline phosphatase is normal.   Could have been drug-induced.  Patient noted to be on statin.  This has been held.  Augmentin can cause hepatitis.  Hepatitis panel unremarkable. LFTs are stable.  Elevated LFTs also noted in 2019 per GI.  Outpatient monitoring.  Essential hypertension Continue home medications  Thrombocytopenia Resolved.  Diabetes mellitus type 2, diet-controlled Last HbA1c was 6.7 in December.    Hypothyroidism Continue Synthroid.  Hyperlipidemia Holding statin due to abnormal LFTs.  We will hold Zetia as well.  History of anxiety disorder Resume her other home medications.  Systolic murmur secondary to moderate aortic stenosis Noted in the aortic area.    Dr. Davina Poke is her cardiologist.  Plan was to do outpatient echocardiogram but since she was in the hospital she could not keep this appointment.  Echocardiogram was done here.  Results as above.  Moderate aortic valve stenosis noted.  Follow-up with her cardiologist.  Patient is stable.  Okay for discharge today.   PERTINENT LABS:  The results of significant diagnostics from this hospitalization (including imaging, microbiology, ancillary and laboratory) are listed below for reference.    Microbiology: Recent Results (from the past 240 hour(s))  SARS CORONAVIRUS 2 (TAT 6-24 HRS) Nasopharyngeal Nasopharyngeal Swab     Status: None   Collection Time: 03/26/20  2:48 PM   Specimen: Nasopharyngeal Swab  Result Value Ref Range Status   SARS Coronavirus 2 NEGATIVE NEGATIVE Final    Comment: (NOTE) SARS-CoV-2 target nucleic acids are NOT DETECTED.  The SARS-CoV-2 RNA is generally detectable in upper and lower respiratory specimens during the acute phase of infection. Negative results do not preclude SARS-CoV-2 infection, do not rule out co-infections with other pathogens, and should not be used as the sole basis for treatment or other patient management decisions. Negative results must be combined with clinical observations, patient history, and epidemiological information. The expected result is Negative.  Fact Sheet for  Patients: SugarRoll.be  Fact Sheet for Healthcare Providers: https://www.woods-mathews.com/  This test is not yet approved or cleared by the [place] FDA and  has been authorized for detection and/or diagnosis of SARS-CoV-2 by FDA under an Emergency Use Authorization (EUA). This EUA will remain  in effect (meaning this test can be used) for the duration of the COVID-19 declaration under Se ction 564(b)(1) of the Act, 21 U.S.C. section 360bbb-3(b)(1), unless the authorization is terminated or revoked sooner.  Performed at [facility], [place] [address] [place], [place] [address]      Labs:  COVID-19 Labs   Lab Results  Component Value Date   [place] 03/26/2020   [place] NEGATIVE 10/20/2018      Basic Metabolic Panel: Recent Labs  Lab 03/27/20 0518 03/28/20 0539 03/29/20 0605 03/30/20 0624 03/31/20 0601  NA 137 137 139 139 137  K 3.9 4.0 3.7 3.9 3.7  CL 104 104 107 104 106  CO2 24 24 22 25 22   GLUCOSE 112* 110* 117* 141* 124*  BUN 8 7* 6* 6* 5*  CREATININE 0.80 0.85 0.82 0.92 0.80  CALCIUM 8.8* 8.7* 8.6* 9.3 8.9   Liver Function Tests: Recent Labs  Lab 03/27/20 0518 03/28/20 0539 03/29/20 0605 03/30/20 0624 03/31/20 0601  AST 102* 100* 90* 93* 72*  ALT 155* 133* 112* 114* 85*  ALKPHOS 68 71 69 82 69  BILITOT 1.2 0.9 0.9 0.8 0.7  PROT 6.5 6.5 6.2* 7.0 6.3*  ALBUMIN 3.2* 3.3* 3.1* 3.6 3.1*   Recent Labs  Lab 03/26/20 0953  LIPASE 23   CBC: Recent Labs  Lab 03/27/20 0518 03/28/20 0539 03/29/20 0605 03/30/20 0624 03/31/20 0601  WBC 5.2 5.1 5.2 5.7 5.6  HGB 12.0 11.6* 11.2* 12.6 11.4*  HCT 36.4 36.4 35.6* 39.3 35.6*  MCV 85.2 86.7 89.4 86.6 88.8  PLT 144* 147* 139* 162 153    CBG: Recent Labs  Lab 03/30/20 1135 03/30/20 1646 03/30/20 2125 03/31/20 0751 03/31/20 1155  GLUCAP 128* 115* 138* 97 124*     IMAGING STUDIES CT Abdomen Pelvis Wo Contrast  Result Date:  03/16/2020 CLINICAL DATA:  Lower abdominal pain, abdominal distension EXAM: CT ABDOMEN AND PELVIS WITHOUT CONTRAST TECHNIQUE: Multidetector CT imaging of the abdomen and pelvis was performed following the standard protocol without IV contrast. COMPARISON:  10/14/2019, 01/11/2018 FINDINGS: Lower chest: Linear bibasilar scarring or atelectasis. Heart size is normal. Calcifications of the aortic valve and mitral annulus. Circumferentially thickened appearance of the distal esophagus. Hepatobiliary: Unremarkable unenhanced appearance of the liver. Prior cholecystectomy. No biliary dilatation. Pancreas: Pancreatic atrophy without ductal dilatation or inflammatory changes. Spleen: Normal in size without focal abnormality. Adrenals/Urinary Tract: Unremarkable adrenal glands. No renal stone or hydronephrosis. Urinary bladder appears within normal limits. Stomach/Bowel: Enteric contrast present throughout the bowel. Small hiatal hernia. Mild diffuse gastric fold thickening, most pronounced within the gastric cardia the and fundus. Two small duodenal diverticula. No dilated loops of small bowel. Large volume stool throughout the colon. Extensive diverticulosis of the descending and sigmoid colon. Subtle bowel wall thickening at the junction of the descending and sigmoid colon (series 3, image 39). Appendix not visualized. No significant pericolonic inflammatory changes identified. Vascular/Lymphatic: Atherosclerotic calcifications throughout the aortoiliac axis. No aneurysm. No abdominopelvic lymphadenopathy. Reproductive: Status post hysterectomy. No adnexal masses. Other: No free fluid. No abdominopelvic fluid collection. No pneumoperitoneum. No abdominal wall hernia. Musculoskeletal: Postsurgical changes from right total hip arthroplasty. Degenerative disc disease and facet arthropathy within the lower lumbar spine. No acute osseous abnormality. IMPRESSION: 1. Findings suggestive of a mild colitis at the junction of the  descending and sigmoid colon, which could reflect early diverticulitis given the extensive diverticular change throughout the descending and sigmoid colon. 2. Circumferentially thickened appearance of the distal esophagus, which may represent esophagitis. 3. Gastric fold thickening most pronounced within the gastric cardia and fundus, which may represent gastritis. 4. Large volume stool throughout the colon suggesting constipation. 5. Aortic atherosclerosis (ICD10-I70.0). Electronically Signed   By: Davina Poke D.O.   On: 03/16/2020 10:36   CT Abdomen Pelvis W Contrast  Result Date: 03/26/2020 CLINICAL DATA:  Abdominal distension.  Left lower quadrant pain. EXAM: CT ABDOMEN AND PELVIS WITH CONTRAST TECHNIQUE: Multidetector CT imaging of the abdomen and pelvis was performed using the standard protocol following bolus administration of intravenous contrast. CONTRAST:  115mL OMNIPAQUE IOHEXOL 300 MG/ML  SOLN COMPARISON:  March 16, 2020 FINDINGS: Lower chest: Small hiatal hernia. Hepatobiliary: No focal liver abnormality is seen. Status post cholecystectomy. No biliary dilatation. Pancreas: Unremarkable. No pancreatic ductal dilatation or surrounding inflammatory changes. Spleen: Normal in size without focal abnormality. Adrenals/Urinary Tract: Adrenal glands are unremarkable. Kidneys are normal, without renal calculi, focal lesion, or hydronephrosis. Bladder is unremarkable. Stomach/Bowel: Normal appearance of the stomach and small. Redundant colon. Extensive left colonic diverticulosis. Long segment of mucosal thickening in the distal descending and sigmoid colon with mild pericolonic inflammatory changes. Vascular/Lymphatic: Aortic atherosclerosis. No enlarged abdominal or pelvic lymph nodes. Reproductive: Status post hysterectomy. No adnexal masses. Other: No abdominal wall hernia or abnormality. No abdominopelvic ascites. Musculoskeletal: Mild spondylosis of the lumbosacral spine. IMPRESSION: 1. Long  segment of mucosal thickening of the distal descending and sigmoid colon with mild pericolonic inflammatory changes, on the  background of extensive diverticulosis. Findings may represent acute diverticulitis or other form of colitis. No evidence of perforation or abscess formation. Colonic malignancy cannot be excluded, although is considered less likely with this appearance. 2. Small hiatal hernia. Aortic Atherosclerosis (ICD10-I70.0). Electronically Signed   By: Fidela Salisbury M.D.   On: 03/26/2020 14:02   ECHOCARDIOGRAM COMPLETE  Result Date: 03/31/2020    ECHOCARDIOGRAM REPORT   Patient Name:   Cheryl Cooper Date of Exam: 03/31/2020 Medical Rec #:  [id number]       Height:       63.0 in Accession #:    [id number]      Weight:       149.7 lb Date of Birth:  1940/07/30        BSA:          1.710 m Patient Age:    72 years        BP:           170/71 mmHg Patient Gender: F               HR:           59 bpm. Exam Location:  Inpatient Procedure: 2D Echo, Cardiac Doppler and Color Doppler Indications:    Murmur 785.2 / R01.1  History:        Patient has prior history of Echocardiogram examinations, most                 recent 12/16/2014. CHF, Stroke; Risk Factors:Hypertension and                 Diabetes.  Sonographer:    Bernadene Person RDCS Referring Phys: [id number] [id number]  1. Left ventricular ejection fraction, by estimation, is >75%. The left ventricle has hyperdynamic function. The left ventricle has no regional wall motion abnormalities. There is mild left ventricular hypertrophy. Left ventricular diastolic parameters are consistent with Grade I diastolic dysfunction (impaired relaxation). Elevated left ventricular end-diastolic pressure. The E/e' is 69.  2. Right ventricular systolic function is normal. The right ventricular size is normal. Tricuspid regurgitation signal is inadequate for assessing PA pressure.  3. The mitral valve is abnormal. Trivial mitral valve regurgitation. No  evidence of mitral stenosis. Moderate mitral annular calcification.  4. The aortic valve is calcified. Aortic valve regurgitation is not visualized. Moderate aortic valve stenosis. Aortic valve area, by VTI measures 1.03 cm. Aortic valve mean gradient measures 32.5 mmHg. Aortic valve Vmax measures 3.63 m/s. DI is 0.40.  5. The inferior vena cava is normal in size with greater than 50% respiratory variability, suggesting right atrial pressure of 3 mmHg. Comparison(s): Prior images unable to be directly viewed, comparison made by report only. Changes from prior study are noted. 11/20/2014: LVEF 65-70%, no aortic stenosis. FINDINGS  Left Ventricle: Left ventricular ejection fraction, by estimation, is >75%. The left ventricle has hyperdynamic function. The left ventricle has no regional wall motion abnormalities. The left ventricular internal cavity size was normal in size. There is mild left ventricular hypertrophy. Left ventricular diastolic parameters are consistent with Grade I diastolic dysfunction (impaired relaxation). Elevated left ventricular end-diastolic pressure. The E/e' is 20. Right Ventricle: The right ventricular size is normal. No increase in right ventricular wall thickness. Right ventricular systolic function is normal. Tricuspid regurgitation signal is inadequate for assessing PA pressure. Left Atrium: Left atrial size was normal in size. Right Atrium: Right atrial size was normal in size. Pericardium: There is no evidence of pericardial effusion.  Mitral Valve: The mitral valve is abnormal. There is mild thickening of the mitral valve leaflet(s). There is mild calcification of the anterior and posterior mitral valve leaflet(s). Moderate mitral annular calcification. Trivial mitral valve regurgitation. No evidence of mitral valve stenosis. Tricuspid Valve: The tricuspid valve is grossly normal. Tricuspid valve regurgitation is trivial. Aortic Valve: The aortic valve is calcified. There is moderate  calcification of the aortic valve. There is moderate thickening of the aortic valve. Aortic valve regurgitation is not visualized. Moderate aortic stenosis is present. Aortic valve mean gradient measures 32.5 mmHg. Aortic valve peak gradient measures 52.8 mmHg. Aortic valve area, by VTI measures 1.03 cm. Pulmonic Valve: The pulmonic valve was normal in structure. Pulmonic valve regurgitation is not visualized. Aorta: The aortic root and ascending aorta are structurally normal, with no evidence of dilitation. Venous: The inferior vena cava is normal in size with greater than 50% respiratory variability, suggesting right atrial pressure of 3 mmHg. IAS/Shunts: No atrial level shunt detected by color flow Doppler.  LEFT VENTRICLE PLAX 2D LVIDd:         3.70 cm  Diastology LVIDs:         1.90 cm  LV e' medial:    6.31 cm/s LV PW:         1.00 cm  LV E/e' medial:  17.3 LV IVS:        1.00 cm  LV e' lateral:   4.82 cm/s LVOT diam:     1.80 cm  LV E/e' lateral: 22.6 LV SV:         91 LV SV Index:   53 LVOT Area:     2.54 cm  RIGHT VENTRICLE RV S prime:     10.80 cm/s TAPSE (M-mode): 2.5 cm LEFT ATRIUM             Index       RIGHT ATRIUM          Index LA diam:        3.10 cm 1.81 cm/m  RA Area:     9.36 cm LA Vol (A2C):   31.8 ml 18.60 ml/m RA Volume:   18.10 ml 10.59 ml/m LA Vol (A4C):   31.1 ml 18.19 ml/m LA Biplane Vol: 31.3 ml 18.31 ml/m  AORTIC VALVE AV Area (Vmax):    1.05 cm AV Area (Vmean):   1.01 cm AV Area (VTI):     1.03 cm AV Vmax:           363.25 cm/s AV Vmean:          267.750 cm/s AV VTI:            0.890 m AV Peak Grad:      52.8 mmHg AV Mean Grad:      32.5 mmHg LVOT Vmax:         150.00 cm/s LVOT Vmean:        106.000 cm/s LVOT VTI:          0.359 m LVOT/AV VTI ratio: 0.40  AORTA Ao Root diam: 2.70 cm Ao Asc diam:  3.00 cm MITRAL VALVE MV Area (PHT): 1.84 cm     SHUNTS MV Decel Time: 412 msec     Systemic VTI:  0.36 m MV E velocity: 109.00 cm/s  Systemic Diam: 1.80 cm MV A velocity: 112.00  cm/s MV E/A ratio:  0.97 Lyman Bishop MD Electronically signed by Lyman Bishop MD Signature Date/Time: 03/31/2020/12:45:51 PM    Final  DISCHARGE EXAMINATION: Vitals:   03/30/20 2131 03/31/20 0508 03/31/20 0808 03/31/20 1329  BP: (!) 162/72 (!) 170/71  (!) 152/71  Pulse: (!) 59 61  65  Resp: 16 14  14   Temp: 98.1 F (36.7 C) 98.2 F (36.8 C)  97.9 F (36.6 C)  TempSrc: Oral Oral  Oral  SpO2: 94% 90%  97%  Weight:      Height:   5\' 3"  (1.6 m)    General appearance: Awake alert.  In no distress Resp: Clear to auscultation bilaterally.  Normal effort Cardio: S1-S2 is normal regular.  No S3-S4.  No rubs murmurs or bruit GI: Abdomen is soft.  Mildly tender in the lower quadrants.  No rebound rigidity or guarding.  Bowel sounds present normal.    DISPOSITION: Home  Discharge Instructions    Call MD for:  difficulty breathing, headache or visual disturbances   Complete by: As directed    Call MD for:  extreme fatigue   Complete by: As directed    Call MD for:  persistant dizziness or light-headedness   Complete by: As directed    Call MD for:  persistant nausea and vomiting   Complete by: As directed    Call MD for:  severe uncontrolled pain   Complete by: As directed    Call MD for:  temperature >100.4   Complete by: As directed    Diet - low sodium heart healthy   Complete by: As directed    Discharge instructions   Complete by: As directed    Please be sure to follow-up with your primary care provider in 1 week. You will need to continue to hold your cholesterol medications since your liver function tests were elevated. Blood work will need to be repeated next week to check your liver function tests. Please also be sure to follow-up with your cardiologist for results of your echocardiogram.  You were cared for by a hospitalist during your hospital stay. If you have any questions about your discharge medications or the care you received while you were in the hospital  after you are discharged, you can call the unit and asked to speak with the hospitalist on call if the hospitalist that took care of you is not available. Once you are discharged, your primary care physician will handle any further medical issues. Please note that NO REFILLS for any discharge medications will be authorized once you are discharged, as it is imperative that you return to your primary care physician (or establish a relationship with a primary care physician if you do not have one) for your aftercare needs so that they can reassess your need for medications and monitor your lab values. If you do not have a primary care physician, you can call [phone] for a physician referral.   Increase activity slowly   Complete by: As directed         Allergies as of 03/31/2020      Reactions   Other Other (See Comments)   Tomato sauce, garlic, onion - severe acid reflux    Flexeril [cyclobenzaprine] Other (See Comments)   Per spouse "she felt like a zombie"   Atorvastatin Other (See Comments)   Unbalanced   Chlordiazepoxide-clidinium Other (See Comments)   Dizziness (intolerance)   Codeine Nausea Only      Medication List    STOP taking these medications   amoxicillin 500 MG capsule Commonly known as: AMOXIL   amoxicillin-clavulanate 875-125 MG tablet Commonly known as: AUGMENTIN  ezetimibe 10 MG tablet Commonly known as: ZETIA   simvastatin 20 MG tablet Commonly known as: ZOCOR   traMADol 50 MG tablet Commonly known as: ULTRAM     TAKE these medications   alendronate 70 MG tablet Commonly known as: FOSAMAX Take 1 tablet (70 mg total) by mouth every Sunday. Take with a full glass of water on an empty stomach.   amLODipine 5 MG tablet Commonly known as: NORVASC Take 1.5 tablets (7.5 mg total) by mouth daily.   CALCIUM-D PO Take 2 tablets by mouth daily. CHEWABLES   ciprofloxacin 500 MG tablet Commonly known as: CIPRO Take 1 tablet (500 mg total) by mouth 2 (two)  times daily for 6 days.   escitalopram 20 MG tablet Commonly known as: LEXAPRO Take 1 tablet (20 mg total) by mouth daily.   esomeprazole 40 MG capsule Commonly known as: NEXIUM Take 1 capsule (40 mg total) by mouth 2 (two) times daily before a meal.   levothyroxine 75 MCG tablet Commonly known as: SYNTHROID Take 1 tablet (75 mcg total) by mouth daily with breakfast.   metroNIDAZOLE 500 MG tablet Commonly known as: FLAGYL Take 1 tablet (500 mg total) by mouth every 8 (eight) hours for 6 days.   nystatin 100000 UNIT/ML suspension Commonly known as: MYCOSTATIN Take 5 mLs (500,000 Units total) by mouth 4 (four) times daily for 7 days.   oxybutynin 15 MG 24 hr tablet Commonly known as: DITROPAN XL Take 1 tablet (15 mg total) by mouth at bedtime.   oxyCODONE 5 MG immediate release tablet Commonly known as: Oxy IR/ROXICODONE Take 1 tablet (5 mg total) by mouth every 6 (six) hours as needed for severe pain.   polyethylene glycol 17 g packet Commonly known as: MIRALAX / GLYCOLAX Take twice daily till constipation is completely resolved. Then change to once daily or as needed.   traZODone 50 MG tablet Commonly known as: DESYREL Take 0.5 tablets (25 mg total) by mouth at bedtime.   WOMENS MULTI GUMMIES PO Take 2 tablets by mouth daily.         Follow-up Information    Arta Silence, MD. Schedule an appointment as soon as possible for a visit.   Specialty: Gastroenterology Contact information: [address] [place] [address] [phone]        Wendie Agreste, MD. Schedule an appointment as soon as possible for a visit in 1 week(s).   Specialties: Family Medicine, Sports Medicine Why: for LFT check Contact information: [place] [address] [phone]               TOTAL DISCHARGE TIME: 35-minute  Bonnielee Haff  [facility] Pager on [email or web address]  03/31/2020, 4:37 PM

## 2020-04-03 ENCOUNTER — Encounter: Payer: Medicare PPO | Admitting: Physical Therapy

## 2020-04-05 ENCOUNTER — Encounter: Payer: Self-pay | Admitting: Family Medicine

## 2020-04-05 ENCOUNTER — Ambulatory Visit: Payer: Medicare PPO | Admitting: Family Medicine

## 2020-04-05 ENCOUNTER — Other Ambulatory Visit: Payer: Self-pay

## 2020-04-05 VITALS — BP 111/68 | HR 85 | Temp 98.1°F | Ht 63.0 in | Wt 148.8 lb

## 2020-04-05 DIAGNOSIS — R7401 Elevation of levels of liver transaminase levels: Secondary | ICD-10-CM | POA: Diagnosis not present

## 2020-04-05 DIAGNOSIS — K5732 Diverticulitis of large intestine without perforation or abscess without bleeding: Secondary | ICD-10-CM | POA: Diagnosis not present

## 2020-04-05 DIAGNOSIS — B370 Candidal stomatitis: Secondary | ICD-10-CM

## 2020-04-05 DIAGNOSIS — R5383 Other fatigue: Secondary | ICD-10-CM | POA: Diagnosis not present

## 2020-04-05 MED ORDER — NYSTATIN 100000 UNIT/ML MT SUSP: 5.0000 mL | Freq: Four times a day (QID) | OROMUCOSAL | 0 refills | Status: AC | PRN

## 2020-04-05 NOTE — Progress Notes (Signed)
Subjective:  Patient ID: Cheryl Cooper, female    DOB: 05/12/1940  Age: 80 y.o. MRN: [id number]  CC:  Chief Complaint  Patient presents with  . hospital f/u    Here for hospital f/u for diverticulitis, patient is feeling fatigue still. But the abd pain is much better. Patient is wondering did she need to get refills on Cipro and metronidazole.      HPI Cheryl Cooper presents for  Hospital follow-up Admitted February 6 through February 11 with diverticulitis, transaminitis.   Diverticulitis Initially treat outpatient with Augmentin.  Transition to Cipro, Flagyl.  Repeat CT indicating diverticulitis versus colitis.  Thought to be acute diverticulitis.  GI consulted with lack of improvement.  Thought to be diverticulitis, previous colonoscopy for obtained in 2018 with diverticulosis and internal hemorrhoids.  Continued on antibiotics.  Started on laxatives for constipation.  Continued on oral Cipro, Flagyl outpatient.  Still feels tired since leaving hospital.  Able to have some BMs - improving. Small BM today.  Stomach feels softer.  No fever. Still taking cipro BID, flagyl TID (6 additional days at discharge).   Having some thrush in hospital - using nystatin swish and swallow QID - was not given at home. Was also given 4 pills for thrush - has finished. Still with some irritation in mouth, dry. No difficulty swallowing.   Transaminitis Unclear cause.  CT without apparent concern in hepatobiliary system.  History of cholecystectomy.  Bilirubin normal.  Alk phos normal.  Possible drug-induced transaminitis and has been on statin which was held as well as Augmentin as possible cause.  Hepatitis panel unremarkable.  LFTs were stable and also noted to be slightly elevated in 2019 per GI.  Plan for outpatient follow-up.  Lab Results  Component Value Date   ALT 85 (H) 03/31/2020   AST 72 (H) 03/31/2020   ALKPHOS 69 03/31/2020   BILITOT 0.7 03/31/2020  AST 44, ALT 43 in June 2019,  then normalized until June 2021 at 24, 44. Peak of 252, 369 on February 3.  Downtrending since that time with most recent labs as above on February 11.   She was continued on Synthroid for hypothyroidism, statin was held for hyperlipidemia as above, Zetia also held, thrombocytopenia resolved.   Lab Results  Component Value Date   WBC 5.6 03/31/2020   HGB 11.4 (L) 03/31/2020   HCT 35.6 (L) 03/31/2020   MCV 88.8 03/31/2020   PLT 153 13/14/3888     Systolic murmur, followed by cardiology.  Moderate aortic stenosis noted on inpatient echocardiogram with planned cardiology follow-up.   History Patient Active Problem List   Diagnosis Date Noted  . Diverticulitis 03/26/2020  . Other fatigue 03/05/2019  . H/O total shoulder replacement, left 10/23/2018  . PUD (peptic ulcer disease) 03/21/2018  . Nausea and vomiting 01/11/2018  . Synovial cyst of lumbar facet joint 12/23/2017  . Osteoarthritis of right hip 04/24/2017  . Primary osteoarthritis of right hip 04/24/2017  . Complete rotator cuff tear 05/09/2016  . Brainstem stroke ([place]) 01/04/2015  . Dizziness and giddiness 01/04/2015  . Post concussion syndrome 01/04/2015  . Rotator cuff tear, right 11/21/2011   Past Medical History:  Diagnosis Date  . Anxiety   . Carpal tunnel syndrome   . Deafness in left ear   . Depression   . Diabetes mellitus    diet controlled  . GERD (gastroesophageal reflux disease)   . Hypercholesteremia   . Hypertension   . Hypothyroid   .  Memory loss    reports d/t concussion  . PONV (postoperative nausea and vomiting) yrs ago, none recent  . Post concussion syndrome   . Stroke [facility])    reports they were silent   Past Surgical History:  Procedure Laterality Date  . ABDOMINAL HYSTERECTOMY  1986  . BACK SURGERY  2010   lower  . BIOPSY  01/12/2018   Procedure: BIOPSY;  Surgeon: Rush Landmark Telford Nab., MD;  Location: [facility];  Service: Gastroenterology;;  . CHOLECYSTECTOMY    .  ESOPHAGOGASTRODUODENOSCOPY (EGD) WITH PROPOFOL N/A 01/12/2018   Procedure: ESOPHAGOGASTRODUODENOSCOPY (EGD) WITH PROPOFOL;  Surgeon: Rush Landmark Telford Nab., MD;  Location: [facility];  Service: Gastroenterology;  Laterality: N/A;  . left elobow surgery  2003  . left rotator cuff  2001  . LUMBAR LAMINECTOMY/DECOMPRESSION MICRODISCECTOMY Left 12/23/2017   Procedure: Laminectomy for facet/synovial cyst - left - Lumbar three-Lumbar four;  Surgeon: Earnie Larsson, MD;  Location: [facility];  Service: Neurosurgery;  Laterality: Left;  . r foot surgery  1995  . REVERSE SHOULDER ARTHROPLASTY Left 10/23/2018   Procedure: REVERSE TOTAL SHOULDER ARTHROPLASTY;  Surgeon: Netta Cedars, MD;  Location: [facility];  Service: Orthopedics;  Laterality: Left;  interscalene block  . right hand surgery  2001  . right index finger surgery  2009  . SHOULDER OPEN ROTATOR CUFF REPAIR  11/21/2011   Procedure: ROTATOR CUFF REPAIR SHOULDER OPEN;  Surgeon: Johnn Hai, MD;  Location: [facility];  Service: Orthopedics;  Laterality: Right;  with subacromial decompression  . SHOULDER OPEN ROTATOR CUFF REPAIR Right 05/09/2016   Procedure: Right shoulder mini open revision rotator cuff repair, subacromial decompression;  Surgeon: Susa Day, MD;  Location: [facility];  Service: Orthopedics;  Laterality: Right;  . TOTAL HIP ARTHROPLASTY Right 04/24/2017   Procedure: RIGHT TOTAL HIP ARTHROPLASTY ANTERIOR APPROACH;  Surgeon: Rod Can, MD;  Location: [facility];  Service: Orthopedics;  Laterality: Right;  Needs RNFA   Allergies  Allergen Reactions  . Other Other (See Comments)    Tomato sauce, garlic, onion - severe acid reflux   . Flexeril [Cyclobenzaprine] Other (See Comments)    Per spouse "she felt like a zombie"  . Atorvastatin Other (See Comments)    Unbalanced  . Chlordiazepoxide-Clidinium Other (See Comments)    Dizziness (intolerance)  . Codeine Nausea Only   Prior to Admission medications   Medication Sig Start Date End Date  Taking? Authorizing Provider  alendronate (FOSAMAX) 70 MG tablet Take 1 tablet (70 mg total) by mouth every Sunday. Take with a full glass of water on an empty stomach. 12/27/18  Yes Wendie Agreste, MD  amLODipine (NORVASC) 5 MG tablet Take 1.5 tablets (7.5 mg total) by mouth daily. 01/19/20  Yes Wendie Agreste, MD  Calcium Carbonate-Vitamin D (CALCIUM-D PO) Take 2 tablets by mouth daily. CHEWABLES   Yes [provider]  ciprofloxacin (CIPRO) 500 MG tablet Take 1 tablet (500 mg total) by mouth 2 (two) times daily for 6 days. 03/31/20 04/06/20 Yes Bonnielee Haff, MD  escitalopram (LEXAPRO) 20 MG tablet Take 1 tablet (20 mg total) by mouth daily. 01/19/20  Yes Wendie Agreste, MD  esomeprazole (NEXIUM) 40 MG capsule Take 1 capsule (40 mg total) by mouth 2 (two) times daily before a meal. 01/13/18  Yes Mikhail, Velta Addison, DO  levothyroxine (SYNTHROID) 75 MCG tablet Take 1 tablet (75 mcg total) by mouth daily with breakfast. 01/19/20  Yes Wendie Agreste, MD  metroNIDAZOLE (FLAGYL) 500 MG tablet Take 1 tablet (500  mg total) by mouth every 8 (eight) hours for 6 days. 03/31/20 04/06/20 Yes Bonnielee Haff, MD  Multiple Vitamins-Minerals (WOMENS MULTI GUMMIES PO) Take 2 tablets by mouth daily.   Yes [provider]  nystatin (MYCOSTATIN) 100000 UNIT/ML suspension Take 5 mLs (500,000 Units total) by mouth 4 (four) times daily for 7 days. 03/31/20 04/07/20 Yes Bonnielee Haff, MD  oxybutynin (DITROPAN XL) 15 MG 24 hr tablet Take 1 tablet (15 mg total) by mouth at bedtime. 01/19/20  Yes Wendie Agreste, MD  oxyCODONE (OXY IR/ROXICODONE) 5 MG immediate release tablet Take 1 tablet (5 mg total) by mouth every 6 (six) hours as needed for severe pain. 03/31/20  Yes Bonnielee Haff, MD  polyethylene glycol (MIRALAX / GLYCOLAX) 17 g packet Take twice daily till constipation is completely resolved. Then change to once daily or as needed. 03/31/20  Yes Bonnielee Haff, MD  traZODone (DESYREL) 50 MG  tablet Take 0.5 tablets (25 mg total) by mouth at bedtime. 01/19/20  Yes Wendie Agreste, MD   Social History   Socioeconomic History  . Marital status: Married    Spouse name: Herschel  . Number of children: 3  . Years of education: 28  . Highest education level: Not on file  Occupational History  . 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 Smoking status: Never Smoker  . Smokeless tobacco: Never Used  Vaping Use  . Vaping Use: Never used  Substance and Sexual Activity  . Alcohol use: Yes    Alcohol/week: 1.0 standard drink    Types: 1 Glasses of wine per week    Comment: occas  . Drug use: No  . Sexual activity: Not Currently    Comment: 1st intercourse 80 yo-Fewer than 5 partners  Other Topics Concern  . Not on file  Social History Narrative   Married, lives at home with husband   caffeine - 1 Coke daily   Social Determinants of Health   Financial Resource Strain: Not on file  Food Insecurity: Not on file  Transportation Needs: Not on file  Physical Activity: Not on file  Stress: Not on file  Social Connections: Not on file  Intimate Partner Violence: Not on file    Review of Systems Per HPI.   Objective:   Vitals:   04/05/20 1118  BP: 111/68  Pulse: 85  Temp: 98.1 F (36.7 C)  TempSrc: Temporal  SpO2: 96%  Weight: 148 lb 12.8 oz (67.5 kg)  Height: 5' 3"  (1.6 m)     Physical Exam Vitals reviewed.  Constitutional:      Appearance: She is well-developed and well-nourished.  HENT:     Head: Normocephalic and atraumatic.  Eyes:     Extraocular Movements: EOM normal.     Conjunctiva/sclera: Conjunctivae normal.     Pupils: Pupils are equal, round, and reactive to light.  Neck:     Vascular: No carotid bruit.  Cardiovascular:     Rate and Rhythm: Normal rate and regular rhythm.     Pulses: Intact distal pulses.     Heart sounds: Murmur (3/6 systolic. ) heard.    Pulmonary:     Effort: Pulmonary effort is normal.      Breath sounds: Normal breath sounds.  Abdominal:     General: There is no distension.     Palpations: Abdomen is soft. There is no pulsatile mass.     Tenderness: There is abdominal tenderness (Slight right lower quadrant, left  lower quadrant without rebound/guarding).  Skin:    General: Skin is warm and dry.  Neurological:     Mental Status: She is alert and oriented to person, place, and time.  Psychiatric:        Mood and Affect: Mood and affect normal.        Behavior: Behavior normal.    34 minutes spent during visit, greater than 50% counseling and assimilation of information, chart review, and discussion of plan.   Assessment & Plan:  Cheryl Cooper is a 80 y.o. female . Diverticulitis of colon - Plan: CBC  -Tolerating outpatient ambulation afebrile, symptomatically improved.  Continue current regimen with close follow-up, RTC/ER precautions.  Fatigue, unspecified type - Plan: CBC  -Check CBC, but likely related to recent hospitalization and illness.  Anticipate improvement with time.  RTC/ER precautions  Transaminitis - Plan: Comprehensive metabolic panel, Hepatic Function Panel  -Continue to hold statin, Zetia.  Potentially could have been related.  Check labs now and again in 5 days  Thrush - Plan: nystatin (MYCOSTATIN) 100000 UNIT/ML suspension  -Overall reassuring exam but will prescribe nystatin swish, spit and still on antibiotics., RTC precautions if worsening.   Meds ordered this encounter  Medications  . nystatin (MYCOSTATIN) 100000 UNIT/ML suspension    Sig: Take 5 mLs (500,000 Units total) by mouth 4 (four) times daily as needed for up to 7 days (ok to swish and spit.).    Dispense:  473 mL    Refill:  0   Patient Instructions    Continue to avoid ezetimibe and simvastatin until we can make sure those are not causing the elevated liver tests. I will check your liver test again today and please return for a lab only visit in 5 days for repeat testing  again.  Continue antibiotics until finished for diverticulitis.   Can restart nystatin swish for thrush. If worse be seen as exam looks ok today.   Return to the clinic or go to the nearest emergency room if any of your symptoms worsen or new symptoms occur.  I expect fatigue to improve as infection improves, as well as time at home.     If you have lab work done today you will be contacted with your lab results within the next 2 weeks.  If you have not heard from [place] then please contact us. The fastest way to get your results is to register for My Chart.   IF you received an x-ray today, you will receive an invoice from [facility]. Please contact [facility] at [phone] with questions or concerns regarding your invoice.   IF you received labwork today, you will receive an invoice from [facility]. Please contact [facility] at [phone] with questions or concerns regarding your invoice.   Our billing staff will not be able to assist you with questions regarding bills from these companies.  You will be contacted with the lab results as soon as they are available. The fastest way to get your results is to activate your My Chart account. Instructions are located on the last page of this paperwork. If you have not heard from [place] regarding the results in 2 weeks, please contact this office.          Signed, Merri Ray, MD [facility]

## 2020-04-05 NOTE — Patient Instructions (Addendum)
  Continue to avoid ezetimibe and simvastatin until we can make sure those are not causing the elevated liver tests. I will check your liver test again today and please return for a lab only visit in 5 days for repeat testing again.  Continue antibiotics until finished for diverticulitis.   Can restart nystatin swish for thrush. If worse be seen as exam looks ok today.   Return to the clinic or go to the nearest emergency room if any of your symptoms worsen or new symptoms occur.  I expect fatigue to improve as infection improves, as well as time at home.     If you have lab work done today you will be contacted with your lab results within the next 2 weeks.  If you have not heard from [place] then please contact us. The fastest way to get your results is to register for My Chart.   IF you received an x-ray today, you will receive an invoice from [facility]. Please contact [facility] at [phone] with questions or concerns regarding your invoice.   IF you received labwork today, you will receive an invoice from [facility]. Please contact [facility] at [phone] with questions or concerns regarding your invoice.   Our billing staff will not be able to assist you with questions regarding bills from these companies.  You will be contacted with the lab results as soon as they are available. The fastest way to get your results is to activate your My Chart account. Instructions are located on the last page of this paperwork. If you have not heard from [place] regarding the results in 2 weeks, please contact this office.

## 2020-04-06 LAB — COMPREHENSIVE METABOLIC PANEL
ALT: 69 IU/L — ABNORMAL HIGH (ref 0–32)
AST: 87 IU/L — ABNORMAL HIGH (ref 0–40)
Albumin/Globulin Ratio: 1.3 (ref 1.2–2.2)
Albumin: 4.1 g/dL (ref 3.7–4.7)
Alkaline Phosphatase: 107 IU/L (ref 44–121)
BUN/Creatinine Ratio: 13 (ref 12–28)
BUN: 11 mg/dL (ref 8–27)
Bilirubin Total: 0.4 mg/dL (ref 0.0–1.2)
CO2: 21 mmol/L (ref 20–29)
Calcium: 9.6 mg/dL (ref 8.7–10.3)
Chloride: 101 mmol/L (ref 96–106)
Creatinine, Ser: 0.86 mg/dL (ref 0.57–1.00)
GFR calc Af Amer: 75 mL/min/{1.73_m2} (ref >59)
GFR calc non Af Amer: 65 mL/min/{1.73_m2} (ref >59)
Globulin, Total: 3.1 g/dL (ref 1.5–4.5)
Glucose: 94 mg/dL (ref 65–99)
Potassium: 4.8 mmol/L (ref 3.5–5.2)
Sodium: 139 mmol/L (ref 134–144)
Total Protein: 7.2 g/dL (ref 6.0–8.5)

## 2020-04-06 LAB — CBC
Hematocrit: 41.2 % (ref 34.0–46.6)
Hemoglobin: 13.4 g/dL (ref 11.1–15.9)
MCH: 27.6 pg (ref 26.6–33.0)
MCHC: 32.5 g/dL (ref 31.5–35.7)
MCV: 85 fL (ref 79–97)
Platelets: 269 10*3/uL (ref 150–450)
RBC: 4.85 x10E6/uL (ref 3.77–5.28)
RDW: 14.7 % (ref 11.7–15.4)
WBC: 6.6 10*3/uL (ref 3.4–10.8)

## 2020-04-10 ENCOUNTER — Ambulatory Visit: Payer: Medicare PPO

## 2020-04-10 ENCOUNTER — Other Ambulatory Visit: Payer: Self-pay

## 2020-04-10 DIAGNOSIS — R7401 Elevation of levels of liver transaminase levels: Secondary | ICD-10-CM | POA: Diagnosis not present

## 2020-04-11 LAB — HEPATIC FUNCTION PANEL
ALT: 51 IU/L — ABNORMAL HIGH (ref 0–32)
AST: 67 IU/L — ABNORMAL HIGH (ref 0–40)
Albumin: 4.2 g/dL (ref 3.7–4.7)
Alkaline Phosphatase: 89 IU/L (ref 44–121)
Bilirubin Total: 0.3 mg/dL (ref 0.0–1.2)
Bilirubin, Direct: 0.14 mg/dL (ref 0.00–0.40)
Total Protein: 7.2 g/dL (ref 6.0–8.5)

## 2020-04-12 NOTE — Progress Notes (Unsigned)
Cardiology Office Note:   Date:  04/13/2020  NAME:  Cheryl Cooper    MRN: [id number] DOB:  1941-01-14   PCP:  Wendie Agreste, MD  Cardiologist:  No primary care provider on file.   Referring MD: Wendie Agreste, MD   Chief Complaint  Patient presents with  . Follow-up  . Shortness of Breath  . Fatigue   History of Present Illness:   Cheryl Cooper is a 80 y.o. female with a hx of DM, HLD, HTN, moderate aortic stenosis who presents for follow-up. Had murmur and planned for outpatient echo.  Echocardiogram was performed as an inpatient for diverticulitis admission.  She reports she is just felt fatigued.  She presents to inquire about fatigue related to her heart valve.  I reviewed her echocardiogram.  She has moderate aortic stenosis.  Murmur also consistent with this.  This is not explanation for her fatigue.  She does snore.  She is tired all the time.  She reports when she gets up in the morning she feels like she can go back to sleep.  We did discuss a sleep study.  She is okay to do this.  Blood sugars have been low.  No other issues.  She was anemic but this has improved.  She may need repeat evaluation by gastroenterology.  No chest pain or shortness of breath.  Symptoms are fatigue related.  We discussed that she needs a repeat echocardiogram in 1 year.  She likely will need a valve intervention in the next 1 to 2 years but this does not explain her symptoms currently.  Problem List 1. DM -A1c 6.7 2. HLD -T chol 147, HDL 48, LDL 77, TG 124 3. HTN 4. Moderate aortic stenosis -03/31/2020: Vmax 3.6 m/s, MG 19mmHG, DI 0.40, AVA 1.03 cm2  Past Medical History: Past Medical History:  Diagnosis Date  . Anxiety   . Carpal tunnel syndrome   . Deafness in left ear   . Depression   . Diabetes mellitus    diet controlled  . GERD (gastroesophageal reflux disease)   . Hypercholesteremia   . Hypertension   . Hypothyroid   . Memory loss    reports d/t concussion  . PONV  (postoperative nausea and vomiting) yrs ago, none recent  . Post concussion syndrome   . Stroke [facility])    reports they were silent    Past Surgical History: Past Surgical History:  Procedure Laterality Date  . ABDOMINAL HYSTERECTOMY  1986  . BACK SURGERY  2010   lower  . BIOPSY  01/12/2018   Procedure: BIOPSY;  Surgeon: Rush Landmark Telford Nab., MD;  Location: [facility];  Service: Gastroenterology;;  . CHOLECYSTECTOMY    . ESOPHAGOGASTRODUODENOSCOPY (EGD) WITH PROPOFOL N/A 01/12/2018   Procedure: ESOPHAGOGASTRODUODENOSCOPY (EGD) WITH PROPOFOL;  Surgeon: Rush Landmark Telford Nab., MD;  Location: [facility];  Service: Gastroenterology;  Laterality: N/A;  . left elobow surgery  2003  . left rotator cuff  2001  . LUMBAR LAMINECTOMY/DECOMPRESSION MICRODISCECTOMY Left 12/23/2017   Procedure: Laminectomy for facet/synovial cyst - left - Lumbar three-Lumbar four;  Surgeon: Earnie Larsson, MD;  Location: [place];  Service: Neurosurgery;  Laterality: Left;  . r foot surgery  1995  . REVERSE SHOULDER ARTHROPLASTY Left 10/23/2018   Procedure: REVERSE TOTAL SHOULDER ARTHROPLASTY;  Surgeon: Netta Cedars, MD;  Location: [facility];  Service: Orthopedics;  Laterality: Left;  interscalene block  . right hand surgery  2001  . right index finger surgery  2009  .  SHOULDER OPEN ROTATOR CUFF REPAIR  11/21/2011   Procedure: ROTATOR CUFF REPAIR SHOULDER OPEN;  Surgeon: Johnn Hai, MD;  Location: [facility];  Service: Orthopedics;  Laterality: Right;  with subacromial decompression  . SHOULDER OPEN ROTATOR CUFF REPAIR Right 05/09/2016   Procedure: Right shoulder mini open revision rotator cuff repair, subacromial decompression;  Surgeon: Susa Day, MD;  Location: [facility];  Service: Orthopedics;  Laterality: Right;  . TOTAL HIP ARTHROPLASTY Right 04/24/2017   Procedure: RIGHT TOTAL HIP ARTHROPLASTY ANTERIOR APPROACH;  Surgeon: Rod Can, MD;  Location: [facility];  Service: Orthopedics;  Laterality: Right;  Needs RNFA     Current Medications: Current Meds  Medication Sig  . alendronate (FOSAMAX) 70 MG tablet Take 1 tablet (70 mg total) by mouth every Sunday. Take with a full glass of water on an empty stomach.  [name] amLODipine (NORVASC) 5 MG tablet Take 1.5 tablets (7.5 mg total) by mouth daily.  . Calcium Carbonate-Vitamin D (CALCIUM-D PO) Take 2 tablets by mouth daily. CHEWABLES  . escitalopram (LEXAPRO) 20 MG tablet Take 1 tablet (20 mg total) by mouth daily.  [name] esomeprazole (NEXIUM) 40 MG capsule Take 1 capsule (40 mg total) by mouth 2 (two) times daily before a meal.  . levothyroxine (SYNTHROID) 75 MCG tablet Take 1 tablet (75 mcg total) by mouth daily with breakfast.  . Multiple Vitamins-Minerals (WOMENS MULTI GUMMIES PO) Take 2 tablets by mouth daily.  [name] oxybutynin (DITROPAN XL) 15 MG 24 hr tablet Take 1 tablet (15 mg total) by mouth at bedtime.  [name] oxyCODONE (OXY IR/ROXICODONE) 5 MG immediate release tablet Take 1 tablet (5 mg total) by mouth every 6 (six) hours as needed for severe pain.  . polyethylene glycol (MIRALAX / GLYCOLAX) 17 g packet Take twice daily till constipation is completely resolved. Then change to once daily or as needed.  . traZODone (DESYREL) 50 MG tablet Take 0.5 tablets (25 mg total) by mouth at bedtime.     Allergies:    Other, Flexeril [cyclobenzaprine], Atorvastatin, Chlordiazepoxide-clidinium, and Codeine   Social History: Social History   Socioeconomic History  . Marital status: Married    Spouse name: Herschel  . Number of children: 3  . Years of education: 19  . Highest education level: Not on file  Occupational History  . 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 Smoking status: Never Smoker  . Smokeless tobacco: Never Used  Vaping Use  . Vaping Use: Never used  Substance and Sexual Activity  . Alcohol use: Yes    Alcohol/week: 1.0 standard drink    Types: 1 Glasses of wine per week    Comment: occas  . Drug use: No  .  Sexual activity: Not Currently    Comment: 1st intercourse 80 yo-Fewer than 5 partners  Other Topics Concern  . Not on file  Social History Narrative   Married, lives at home with husband   caffeine - 1 Coke daily   Social Determinants of Health   Financial Resource Strain: Not on file  Food Insecurity: Not on file  Transportation Needs: Not on file  Physical Activity: Not on file  Stress: Not on file  Social Connections: Not on file     Family History: The patient's family history includes Dementia in her brother; Diabetes in her brother; Heart attack in her father and mother; Heart disease in her brother; Stroke in her brother, father, sister, and sister.  ROS:   All other  ROS reviewed and negative. Pertinent positives noted in the HPI.     EKGs/Labs/Other Studies Reviewed:   The following studies were personally reviewed by me today:  TTE 03/31/2020 1. Left ventricular ejection fraction, by estimation, is >75%. The left  ventricle has hyperdynamic function. The left ventricle has no regional  wall motion abnormalities. There is mild left ventricular hypertrophy.  Left ventricular diastolic parameters  are consistent with Grade I diastolic dysfunction (impaired relaxation).  Elevated left ventricular end-diastolic pressure. The E/e' is 67.  2. Right ventricular systolic function is normal. The right ventricular  size is normal. Tricuspid regurgitation signal is inadequate for assessing  PA pressure.  3. The mitral valve is abnormal. Trivial mitral valve regurgitation. No  evidence of mitral stenosis. Moderate mitral annular calcification.  4. The aortic valve is calcified. Aortic valve regurgitation is not  visualized. Moderate aortic valve stenosis. Aortic valve area, by VTI  measures 1.03 cm. Aortic valve mean gradient measures 32.5 mmHg. Aortic  valve Vmax measures 3.63 m/s. DI is 0.40.  5. The inferior vena cava is normal in size with greater than 50%   respiratory variability, suggesting right atrial pressure of 3 mmHg.   Recent Labs: 01/21/2020: TSH 3.740 04/05/2020: BUN 11; Creatinine, Ser 0.86; Hemoglobin 13.4; Platelets 269; Potassium 4.8; Sodium 139 04/10/2020: ALT 51   Recent Lipid Panel    Component Value Date/Time   CHOL 147 01/21/2020 0958   TRIG 124 01/21/2020 0958   HDL 48 01/21/2020 0958   CHOLHDL 3.1 01/21/2020 0958   LDLCALC 77 01/21/2020 0958    Physical Exam:   VS:  BP (!) 128/58 (BP Location: Left Arm, Patient Position: Sitting, Cuff Size: Normal)   Pulse 68   Ht 5\' 3"  (1.6 m)   Wt 148 lb (67.1 kg)   BMI 26.22 kg/m    Wt Readings from Last 3 Encounters:  04/13/20 148 lb (67.1 kg)  04/05/20 148 lb 12.8 oz (67.5 kg)  03/26/20 149 lb 11.1 oz (67.9 kg)    General: Well nourished, well developed, in no acute distress Head: Atraumatic, normal size  Eyes: PEERLA, EOMI  Neck: Supple, no JVD Endocrine: No thryomegaly Cardiac: Normal S1, S2; RRR; 3 out of 6 systolic ejection murmur Lungs: Clear to auscultation bilaterally, no wheezing, rhonchi or rales  Abd: Soft, nontender, no hepatomegaly  Ext: No edema, pulses 2+ Musculoskeletal: No deformities, BUE and BLE strength normal and equal Skin: Warm and dry, no rashes   Neuro: Alert and oriented to person, place, time, and situation, CNII-XII grossly intact, no focal deficits  Psych: Normal mood and affect   ASSESSMENT:   Cheryl Cooper is a 80 y.o. female who presents for the following: 1. Nonrheumatic aortic valve stenosis   2. Mixed hyperlipidemia   3. Snoring   4. Chronic fatigue     PLAN:   1. Nonrheumatic aortic valve stenosis -Recent echocardiogram demonstrates moderate aortic stenosis.  Murmur on exam consistent with moderate disease.  This does not explain her fatigue. -We discussed the time course of aortic stenosis in the office.  We will plan to repeat an echocardiogram 1 year to monitor.  She likely will need intervention in the next 1 to 2  years.  2. Mixed hyperlipidemia -Holding statin due to elevated liver enzymes.  She should consider Zetia given her diabetes.  3. Snoring 4. Chronic fatigue -She reports she is chronically fatigued.  She snores.  She reports she does not stop breathing per husband report but sleep  apnea may be an issue.  We will set her up for a home sleep study.  Disposition: Return in about 1 year (around 04/13/2021).  Medication Adjustments/Labs and Tests Ordered: Current medicines are reviewed at length with the patient today.  Concerns regarding medicines are outlined above.  Orders Placed This Encounter  Procedures  . ECHOCARDIOGRAM COMPLETE  . Home sleep test   No orders of the defined types were placed in this encounter.   Patient Instructions  Medication Instructions:  The current medical regimen is effective;  continue present plan and medications.  *If you need a refill on your cardiac medications before your next appointment, please call your pharmacy*   Testing/Procedures: Your physician has recommended that you have a sleep study. This test records several body functions during sleep, including: brain activity, eye movement, oxygen and carbon dioxide blood levels, heart rate and rhythm, breathing rate and rhythm, the flow of air through your mouth and nose, snoring, body muscle movements, and chest and belly movement.  Echocardiogram (before one year appointment) - Your physician has requested that you have an echocardiogram. Echocardiography is a painless test that uses sound waves to create images of your heart. It provides your doctor with information about the size and shape of your heart and how well your heart's chambers and valves are working. This procedure takes approximately one hour. There are no restrictions for this procedure. This will be performed at our [facility] location - [address], [address].    Follow-Up: At [facility], you and your health needs are our  priority.  As part of our continuing mission to provide you with exceptional heart care, we have created designated Provider Care Teams.  These Care Teams include your primary Cardiologist (physician) and Advanced Practice Providers (APPs -  Physician Assistants and Nurse Practitioners) who all work together to provide you with the care you need, when you need it.  We recommend signing up for the patient portal called "MyChart".  Sign up information is provided on this After Visit Summary.  MyChart is used to connect with patients for Virtual Visits (Telemedicine).  Patients are able to view lab/test results, encounter notes, upcoming appointments, etc.  Non-urgent messages can be sent to your provider as well.   To learn more about what you can do with MyChart, go to [email or web address].    Your next appointment:   12 month(s)  The format for your next appointment:   In Person  Provider:   Eleonore Chiquito, MD        Time Spent with Patient: I have spent a total of 25 minutes with patient reviewing hospital notes, telemetry, EKGs, labs and examining the patient as well as establishing an assessment and plan that was discussed with the patient.  > 50% of time was spent in direct patient care.  Signed, Addison Naegeli. Audie Box, [place]  [address] [place], [place] [address] [phone]  04/13/2020 11:29 AM

## 2020-04-13 ENCOUNTER — Ambulatory Visit: Payer: Medicare PPO | Admitting: Cardiovascular Disease

## 2020-04-13 ENCOUNTER — Other Ambulatory Visit: Payer: Self-pay

## 2020-04-13 ENCOUNTER — Encounter: Payer: Self-pay | Admitting: Cardiovascular Disease

## 2020-04-13 VITALS — BP 128/58 | HR 68 | Ht 63.0 in | Wt 148.0 lb

## 2020-04-13 DIAGNOSIS — R0683 Snoring: Secondary | ICD-10-CM

## 2020-04-13 DIAGNOSIS — R5382 Chronic fatigue, unspecified: Secondary | ICD-10-CM | POA: Diagnosis not present

## 2020-04-13 DIAGNOSIS — E782 Mixed hyperlipidemia: Secondary | ICD-10-CM | POA: Diagnosis not present

## 2020-04-13 DIAGNOSIS — I350 Nonrheumatic aortic (valve) stenosis: Secondary | ICD-10-CM | POA: Diagnosis not present

## 2020-04-13 NOTE — Patient Instructions (Signed)
Medication Instructions:  The current medical regimen is effective;  continue present plan and medications.  *If you need a refill on your cardiac medications before your next appointment, please call your pharmacy*   Testing/Procedures: Your physician has recommended that you have a sleep study. This test records several body functions during sleep, including: brain activity, eye movement, oxygen and carbon dioxide blood levels, heart rate and rhythm, breathing rate and rhythm, the flow of air through your mouth and nose, snoring, body muscle movements, and chest and belly movement.  Echocardiogram (before one year appointment) - Your physician has requested that you have an echocardiogram. Echocardiography is a painless test that uses sound waves to create images of your heart. It provides your doctor with information about the size and shape of your heart and how well your heart's chambers and valves are working. This procedure takes approximately one hour. There are no restrictions for this procedure. This will be performed at our [facility] location - [address].    Follow-Up: At [facility], you and your health needs are our priority.  As part of our continuing mission to provide you with exceptional heart care, we have created designated Provider Care Teams.  These Care Teams include your primary Cardiologist (physician) and Advanced Practice Providers (APPs -  Physician Assistants and Nurse Practitioners) who all work together to provide you with the care you need, when you need it.  We recommend signing up for the patient portal called "MyChart".  Sign up information is provided on this After Visit Summary.  MyChart is used to connect with patients for Virtual Visits (Telemedicine).  Patients are able to view lab/test results, encounter notes, upcoming appointments, etc.  Non-urgent messages can be sent to your provider as well.   To learn more about what you can do with  MyChart, go to [email or web address].    Your next appointment:   12 month(s)  The format for your next appointment:   In Person  Provider:   Eleonore Chiquito, MD

## 2020-04-14 ENCOUNTER — Encounter: Payer: Self-pay | Admitting: Family Medicine

## 2020-04-14 ENCOUNTER — Ambulatory Visit: Payer: Medicare PPO | Admitting: Family Medicine

## 2020-04-14 VITALS — BP 124/62 | HR 68 | Temp 97.9°F | Resp 16 | Ht 63.0 in | Wt 147.0 lb

## 2020-04-14 DIAGNOSIS — R5383 Other fatigue: Secondary | ICD-10-CM

## 2020-04-14 DIAGNOSIS — R7401 Elevation of levels of liver transaminase levels: Secondary | ICD-10-CM

## 2020-04-14 DIAGNOSIS — K5732 Diverticulitis of large intestine without perforation or abscess without bleeding: Secondary | ICD-10-CM | POA: Diagnosis not present

## 2020-04-14 DIAGNOSIS — M549 Dorsalgia, unspecified: Secondary | ICD-10-CM | POA: Diagnosis not present

## 2020-04-14 DIAGNOSIS — M25461 Effusion, right knee: Secondary | ICD-10-CM

## 2020-04-14 DIAGNOSIS — G8929 Other chronic pain: Secondary | ICD-10-CM | POA: Diagnosis not present

## 2020-04-14 NOTE — Patient Instructions (Addendum)
Please call [facility] gastroenterologist for follow up diverticulitis and elevated liver tests.  If any worsening abdominal pain, fevers or other worsening symptoms be seen right away.  Liver tests improving - still avoid taking statin cholesterol med. In next month may consider restarting zetia - can discuss with gastroenterology.  For now - continue oxycodone if needed for back pain until liver tests improve.  Use lowest effective dose as that can also be contributing to fatigue.  Recheck in 2 weeks.  Lab only visit for liver tests next week.     If you have lab work done today you will be contacted with your lab results within the next 2 weeks.  If you have not heard from [place] then please contact us. The fastest way to get your results is to register for My Chart.   IF you received an x-ray today, you will receive an invoice from [facility]. Please contact [facility] at [phone] with questions or concerns regarding your invoice.   IF you received labwork today, you will receive an invoice from [place]. Please contact [facility] at [phone] with questions or concerns regarding your invoice.   Our billing staff will not be able to assist you with questions regarding bills from these companies.  You will be contacted with the lab results as soon as they are available. The fastest way to get your results is to activate your My Chart account. Instructions are located on the last page of this paperwork. If you have not heard from [place] regarding the results in 2 weeks, please contact this office.

## 2020-04-14 NOTE — Progress Notes (Signed)
Subjective:  Patient ID: Cheryl Cooper, female    DOB: 1940/05/23  Age: 80 y.o. MRN: [id number]  CC:  Chief Complaint  Patient presents with  . Fatigue    Pt reports doing okay still tired but did see Cards yesterday he will do sleep apnea test on her to check that, last OV note states wants repeat testing for liver enzyme and wants to know if she should see a specialist   . Diverticulitis    Pt reports this has been better since Monday no concerns at this time   . Back Pain    Pt has had long time back pain, but with the new guideline not to take aspirin and her elevated liver enzyme she wants to know what the safest thing is for her to take to get relief     HPI Cheryl Cooper presents for   Follow-up with concerns above.  Last visit with me February 16. Diverticulitis Previously hospitalized for diverticulitis.  Improving at last visit.  Some persistent fatigue.  Started on nystatin swish and swallow for possible thrush at her February 16 visit No n/v/d. abd pain has continued to improve. Eating and drinking ok. Some soreness with pushing on area only. Has not called GI for follow up yet.  No fever.  Still fatigued.   Elevated LFTs/transaminitis. See last visit.  Also discussed in hospital, possible medication, as with Augmentin or statin.  Statin has been held as well as Zetia. Saw cardiology yesterday for aortic valve stenosis.  Plan on repeat echo in 1 year.  Continue to hold statin, consider restarting Zetia given diabetes.  Home sleep study was also ordered for her chronic fatigue and snoring. LFTs are downtrending with most recent levels below.  Previously AST 87, ALT 69 on February 16. Consult by Dr. Paulita Fujita while in hospital.   Lab Results  Component Value Date   ALT 51 (H) 04/10/2020   AST 67 (H) 04/10/2020   ALKPHOS 89 04/10/2020   BILITOT 0.3 04/10/2020   History of chronic back pain, avoiding aspirin, and Tylenol given elevated LFTs. Taking oxycodone 1-2 per  day. BM's ok with senokot.   R knee swelling: Off and on for years, no pain. No change in motion, no treatments.    History Patient Active Problem List   Diagnosis Date Noted  . Diverticulitis 03/26/2020  . Other fatigue 03/05/2019  . H/O total shoulder replacement, left 10/23/2018  . PUD (peptic ulcer disease) 03/21/2018  . Nausea and vomiting 01/11/2018  . Synovial cyst of lumbar facet joint 12/23/2017  . Osteoarthritis of right hip 04/24/2017  . Primary osteoarthritis of right hip 04/24/2017  . Complete rotator cuff tear 05/09/2016  . 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 Dizziness and giddiness 01/04/2015  . Post concussion syndrome 01/04/2015  . Rotator cuff tear, right 11/21/2011   Past Medical History:  Diagnosis Date  . Anxiety   . Carpal tunnel syndrome   . Deafness in left ear   . Depression   . Diabetes mellitus    diet controlled  . GERD (gastroesophageal reflux disease)   . Hypercholesteremia   . Hypertension   . Hypothyroid   . Memory loss    reports d/t concussion  . PONV (postoperative nausea and vomiting) yrs ago, none recent  . Post concussion syndrome   . Stroke [facility])    reports they were silent   Past Surgical History:  Procedure Laterality Date  . ABDOMINAL HYSTERECTOMY  1986  .  BACK SURGERY  2010   lower  . BIOPSY  01/12/2018   Procedure: BIOPSY;  Surgeon: Rush Landmark Telford Nab., MD;  Location: [facility];  Service: Gastroenterology;;  . CHOLECYSTECTOMY    . ESOPHAGOGASTRODUODENOSCOPY (EGD) WITH PROPOFOL N/A 01/12/2018   Procedure: ESOPHAGOGASTRODUODENOSCOPY (EGD) WITH PROPOFOL;  Surgeon: Rush Landmark Telford Nab., MD;  Location: [facility];  Service: Gastroenterology;  Laterality: N/A;  . left elobow surgery  2003  . left rotator cuff  2001  . LUMBAR LAMINECTOMY/DECOMPRESSION MICRODISCECTOMY Left 12/23/2017   Procedure: Laminectomy for facet/synovial cyst - left - Lumbar three-Lumbar four;  Surgeon: Earnie Larsson, MD;  Location: [facility];   Service: Neurosurgery;  Laterality: Left;  . r foot surgery  1995  . REVERSE SHOULDER ARTHROPLASTY Left 10/23/2018   Procedure: REVERSE TOTAL SHOULDER ARTHROPLASTY;  Surgeon: Netta Cedars, MD;  Location: [facility];  Service: Orthopedics;  Laterality: Left;  interscalene block  . right hand surgery  2001  . right index finger surgery  2009  . SHOULDER OPEN ROTATOR CUFF REPAIR  11/21/2011   Procedure: ROTATOR CUFF REPAIR SHOULDER OPEN;  Surgeon: Johnn Hai, MD;  Location: [facility];  Service: Orthopedics;  Laterality: Right;  with subacromial decompression  . SHOULDER OPEN ROTATOR CUFF REPAIR Right 05/09/2016   Procedure: Right shoulder mini open revision rotator cuff repair, subacromial decompression;  Surgeon: Susa Day, MD;  Location: [facility];  Service: Orthopedics;  Laterality: Right;  . TOTAL HIP ARTHROPLASTY Right 04/24/2017   Procedure: RIGHT TOTAL HIP ARTHROPLASTY ANTERIOR APPROACH;  Surgeon: Rod Can, MD;  Location: [facility];  Service: Orthopedics;  Laterality: Right;  Needs RNFA   Allergies  Allergen Reactions  . Other Other (See Comments)    Tomato sauce, garlic, onion - severe acid reflux   . Flexeril [Cyclobenzaprine] Other (See Comments)    Per spouse "she felt like a zombie"  . Atorvastatin Other (See Comments)    Unbalanced  . Chlordiazepoxide-Clidinium Other (See Comments)    Dizziness (intolerance)  . Codeine Nausea Only   Prior to Admission medications   Medication Sig Start Date End Date Taking? Authorizing Provider  alendronate (FOSAMAX) 70 MG tablet Take 1 tablet (70 mg total) by mouth every Sunday. Take with a full glass of water on an empty stomach. 12/27/18  Yes Wendie Agreste, MD  amLODipine (NORVASC) 5 MG tablet Take 1.5 tablets (7.5 mg total) by mouth daily. 01/19/20  Yes Wendie Agreste, MD  Calcium Carbonate-Vitamin D (CALCIUM-D PO) Take 2 tablets by mouth daily. CHEWABLES   Yes [provider]  escitalopram (LEXAPRO) 20 MG tablet Take 1 tablet  (20 mg total) by mouth daily. 01/19/20  Yes Wendie Agreste, MD  esomeprazole (NEXIUM) 40 MG capsule Take 1 capsule (40 mg total) by mouth 2 (two) times daily before a meal. 01/13/18  Yes Mikhail, Velta Addison, DO  levothyroxine (SYNTHROID) 75 MCG tablet Take 1 tablet (75 mcg total) by mouth daily with breakfast. 01/19/20  Yes Wendie Agreste, MD  Multiple Vitamins-Minerals (WOMENS MULTI GUMMIES PO) Take 2 tablets by mouth daily.   Yes [provider]  oxybutynin (DITROPAN XL) 15 MG 24 hr tablet Take 1 tablet (15 mg total) by mouth at bedtime. 01/19/20  Yes Wendie Agreste, MD  oxyCODONE (OXY IR/ROXICODONE) 5 MG immediate release tablet Take 1 tablet (5 mg total) by mouth every 6 (six) hours as needed for severe pain. 03/31/20  Yes Bonnielee Haff, MD  polyethylene glycol (MIRALAX / GLYCOLAX) 17 g packet Take twice daily till  constipation is completely resolved. Then change to once daily or as needed. 03/31/20  Yes Bonnielee Haff, MD  traZODone (DESYREL) 50 MG tablet Take 0.5 tablets (25 mg total) by mouth at bedtime. 01/19/20  Yes Wendie Agreste, MD   Social History   Socioeconomic History  . Marital status: Married    Spouse name: Herschel  . Number of children: 3  . Years of education: 63  . Highest education level: Not on file  Occupational History  . 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 Smoking status: Never Smoker  . Smokeless tobacco: Never Used  Vaping Use  . Vaping Use: Never used  Substance and Sexual Activity  . Alcohol use: Yes    Alcohol/week: 1.0 standard drink    Types: 1 Glasses of wine per week    Comment: occas  . Drug use: No  . Sexual activity: Not Currently    Comment: 1st intercourse 80 yo-Fewer than 5 partners  Other Topics Concern  . Not on file  Social History Narrative   Married, lives at home with husband   caffeine - 1 Coke daily   Social Determinants of Health   Financial Resource Strain: Not on file  Food  Insecurity: Not on file  Transportation Needs: Not on file  Physical Activity: Not on file  Stress: Not on file  Social Connections: Not on file  Intimate Partner Violence: Not on file    Review of Systems   Objective:   Vitals:   04/14/20 1632  BP: 124/62  Pulse: 68  Resp: 16  Temp: 97.9 F (36.6 C)  TempSrc: Temporal  SpO2: 97%  Weight: 147 lb (66.7 kg)  Height: 5\' 3"  (1.6 m)     Physical Exam Vitals reviewed.  Constitutional:      Appearance: She is well-developed and well-nourished.  HENT:     Head: Normocephalic and atraumatic.  Eyes:     Extraocular Movements: EOM normal.     Conjunctiva/sclera: Conjunctivae normal.     Pupils: Pupils are equal, round, and reactive to light.  Neck:     Vascular: No carotid bruit.  Cardiovascular:     Rate and Rhythm: Normal rate and regular rhythm.     Pulses: Intact distal pulses.     Heart sounds: Normal heart sounds.  Pulmonary:     Effort: Pulmonary effort is normal.     Breath sounds: Normal breath sounds.  Abdominal:     Palpations: Abdomen is soft. There is no pulsatile mass.     Tenderness: There is no abdominal tenderness (lower bilateral with deep palpation, no rebound/guarding.  No appreciable distention.).  Musculoskeletal:       Legs:  Skin:    General: Skin is warm and dry.  Neurological:     Mental Status: She is alert and oriented to person, place, and time.  Psychiatric:        Mood and Affect: Mood and affect normal.        Behavior: Behavior normal.        Assessment & Plan:  Cheryl Cooper is a 80 y.o. female . Diverticulitis of colon  -Improving.  Follow-up planned with gastroenterology.  If any increasing pain or new symptoms recommended to be seen immediately.  Fatigue, unspecified type  -Possibly multifactorial with hospitalization, diverticulitis as above that  is improving, as well as side effects from oxycodone, possible underlying sleep apnea.  Home sleep test pending.  Limited  on pain medication options right now due to transaminitis.  Lowest effective dose of oxycodone for now with transition to other meds as LFTs improve.  2-week recheck.  Transaminitis - Plan: Comprehensive metabolic panel, CANCELED: Comprehensive metabolic panel  -Downtrending on recent labs earlier this week.  Plan for recheck labs next week.  Follow-up with gastroenterology planned.  Swelling of joint of right knee  -Infrapatellar bursitis likely, nontender, option of aspiration at next visit with RTC precautions if new symptoms.  Chronic back pain, unspecified back location, unspecified back pain laterality  -Has oxycodone if needed for now, can evaluate pain med regimen as LFTs improve.   No orders of the defined types were placed in this encounter.  Patient Instructions   Please call [facility] gastroenterologist for follow up diverticulitis and elevated liver tests.  If any worsening abdominal pain, fevers or other worsening symptoms be seen right away.  Liver tests improving - still avoid taking statin cholesterol med. In next month may consider restarting zetia - can discuss with gastroenterology.  For now - continue oxycodone if needed for back pain until liver tests improve.  Use lowest effective dose as that can also be contributing to fatigue.  Recheck in 2 weeks.  Lab only visit for liver tests next week.     If you have lab work done today you will be contacted with your lab results within the next 2 weeks.  If you have not heard from [place] then please contact us. The fastest way to get your results is to register for My Chart.   IF you received an x-ray today, you will receive an invoice from [facility]. Please contact [facility] at [phone] with questions or concerns regarding your invoice.   IF you received labwork today, you will receive an invoice from [place]. Please contact [facility] at [phone] with questions or concerns regarding your invoice.    Our billing staff will not be able to assist you with questions regarding bills from these companies.  You will be contacted with the lab results as soon as they are available. The fastest way to get your results is to activate your My Chart account. Instructions are located on the last page of this paperwork. If you have not heard from [place] regarding the results in 2 weeks, please contact this office.         Signed, Merri Ray, MD [facility]

## 2020-04-18 ENCOUNTER — Other Ambulatory Visit: Payer: Self-pay

## 2020-04-18 ENCOUNTER — Ambulatory Visit: Payer: Medicare PPO

## 2020-04-18 DIAGNOSIS — R7401 Elevation of levels of liver transaminase levels: Secondary | ICD-10-CM

## 2020-04-19 ENCOUNTER — Telehealth: Payer: Self-pay | Admitting: Family Medicine

## 2020-04-19 LAB — COMPREHENSIVE METABOLIC PANEL
ALT: 33 IU/L — ABNORMAL HIGH (ref 0–32)
AST: 38 IU/L (ref 0–40)
Albumin/Globulin Ratio: 1.4 (ref 1.2–2.2)
Albumin: 3.9 g/dL (ref 3.7–4.7)
Alkaline Phosphatase: 59 IU/L (ref 44–121)
BUN/Creatinine Ratio: 10 — ABNORMAL LOW (ref 12–28)
BUN: 10 mg/dL (ref 8–27)
Bilirubin Total: 0.2 mg/dL (ref 0.0–1.2)
CO2: 22 mmol/L (ref 20–29)
Calcium: 9.2 mg/dL (ref 8.7–10.3)
Chloride: 100 mmol/L (ref 96–106)
Creatinine, Ser: 1.05 mg/dL — ABNORMAL HIGH (ref 0.57–1.00)
Globulin, Total: 2.7 g/dL (ref 1.5–4.5)
Glucose: 153 mg/dL — ABNORMAL HIGH (ref 65–99)
Potassium: 4.4 mmol/L (ref 3.5–5.2)
Sodium: 135 mmol/L (ref 134–144)
Total Protein: 6.6 g/dL (ref 6.0–8.5)
eGFR: 55 mL/min/{1.73_m2} — ABNORMAL LOW (ref >59)

## 2020-04-19 MED ORDER — OXYCODONE HCL 5 MG PO TABS: 5.0000 mg | ORAL_TABLET | Freq: Four times a day (QID) | ORAL | 0 refills | Status: DC | PRN

## 2020-04-19 NOTE — Telephone Encounter (Signed)
Medication Refill - Medication: oxyCODONE (OXY IR/ROXICODONE) 5 MG immediate release tablet    Preferred Pharmacy (with phone number or street name):  [facility] [place], [place] Phone:  [phone]  Fax:  [phone]       Agent: Please be advised that RX refills may take up to 3 business days. We ask that you follow-up with your pharmacy.

## 2020-04-19 NOTE — Telephone Encounter (Signed)
Patient is requesting a refill of the following medications: Requested Prescriptions   Pending Prescriptions Disp Refills   oxyCODONE (OXY IR/ROXICODONE) 5 MG immediate release tablet 20 tablet 0    Sig: Take 1 tablet (5 mg total) by mouth every 6 (six) hours as needed for severe pain.    Date of patient request: 04/19/20 Last office visit: 04/14/20 Date of last refill: 03/31/20 Last refill amount: 20 Follow up time period per chart: 04/28/20

## 2020-04-19 NOTE — Telephone Encounter (Signed)
Requested medication (s) are due for refill today: yes  Requested medication (s) are on the active medication list: yes  Last refill: 03/31/2020  Future visit scheduled:  Yes  Notes to clinic:  this refill cannot be delegated    Requested Prescriptions  Pending Prescriptions Disp Refills   oxyCODONE (OXY IR/ROXICODONE) 5 MG immediate release tablet 20 tablet 0    Sig: Take 1 tablet (5 mg total) by mouth every 6 (six) hours as needed for severe pain.      There is no refill protocol information for this order

## 2020-04-21 DIAGNOSIS — R748 Abnormal levels of other serum enzymes: Secondary | ICD-10-CM | POA: Diagnosis not present

## 2020-04-21 DIAGNOSIS — K5792 Diverticulitis of intestine, part unspecified, without perforation or abscess without bleeding: Secondary | ICD-10-CM | POA: Diagnosis not present

## 2020-04-21 DIAGNOSIS — Z86010 Personal history of colonic polyps: Secondary | ICD-10-CM | POA: Diagnosis not present

## 2020-04-21 DIAGNOSIS — K219 Gastro-esophageal reflux disease without esophagitis: Secondary | ICD-10-CM | POA: Diagnosis not present

## 2020-04-27 DIAGNOSIS — M47816 Spondylosis without myelopathy or radiculopathy, lumbar region: Secondary | ICD-10-CM | POA: Diagnosis not present

## 2020-04-28 ENCOUNTER — Encounter: Payer: Self-pay | Admitting: Family Medicine

## 2020-04-28 ENCOUNTER — Other Ambulatory Visit: Payer: Self-pay

## 2020-04-28 ENCOUNTER — Ambulatory Visit: Payer: Medicare PPO | Admitting: Family Medicine

## 2020-04-28 VITALS — BP 121/71 | HR 62 | Temp 98.0°F | Ht 63.0 in | Wt 146.0 lb

## 2020-04-28 DIAGNOSIS — R5383 Other fatigue: Secondary | ICD-10-CM

## 2020-04-28 DIAGNOSIS — G8929 Other chronic pain: Secondary | ICD-10-CM

## 2020-04-28 DIAGNOSIS — R7401 Elevation of levels of liver transaminase levels: Secondary | ICD-10-CM

## 2020-04-28 DIAGNOSIS — Z8719 Personal history of other diseases of the digestive system: Secondary | ICD-10-CM

## 2020-04-28 DIAGNOSIS — M549 Dorsalgia, unspecified: Secondary | ICD-10-CM

## 2020-04-28 MED ORDER — OXYCODONE HCL 5 MG PO TABS: 5.0000 mg | ORAL_TABLET | Freq: Four times a day (QID) | ORAL | 0 refills | Status: DC | PRN

## 2020-04-28 NOTE — Patient Instructions (Addendum)
Ok to continue the oxycodone until seen by pain management. Make sure to continue stool softener or miralax while taking antibiotics. If diarrhea, can pause miralax for a day or two.  If any new or worsening abdominal pain be seen right away.   Call Dr. Debbe Mounts office about the home sleep test.   Try restarting zetia once per day. Lab visit in 1 week. Recheck with me in 3 months.  Return to the clinic or go to the nearest emergency room if any of your symptoms worsen or new symptoms occur.   If you have lab work done today you will be contacted with your lab results within the next 2 weeks.  If you have not heard from [place] then please contact us. The fastest way to get your results is to register for My Chart.   IF you received an x-ray today, you will receive an invoice from [facility]. Please contact [facility] at [phone] with questions or concerns regarding your invoice.   IF you received labwork today, you will receive an invoice from [place]. Please contact [facility] at [phone] with questions or concerns regarding your invoice.   Our billing staff will not be able to assist you with questions regarding bills from these companies.  You will be contacted with the lab results as soon as they are available. The fastest way to get your results is to activate your My Chart account. Instructions are located on the last page of this paperwork. If you have not heard from [place] regarding the results in 2 weeks, please contact this office.

## 2020-04-28 NOTE — Progress Notes (Signed)
Subjective:  Patient ID: Cheryl Cooper, female    DOB: [birth year]  Age: 80 y.o. MRN: [id number]  CC:  Chief Complaint  Patient presents with  . Follow-up    On Diverticulitis of the colon. Pt reports since last OV she hasn't had any issues with it. Pt has been taking merilax everyday as recommended. Pt wants to know if its ok to take synogut everyday. Pt reports fatigue has improved since last OV with the help of vitamin B-12 that she has been taking.    HPI Cheryl Cooper presents for   Follow-up from February 25th visit. Diverticulitis See previous notes, previously hospitalized.  Some persistent fatigue.  Abdominal pain was improving at last visit, drinking okay.  Had not yet called GI for follow-up.  No fevers. No abd pain, n/v. BM every 1-2 days. Soft stool. Taking miralax daily.   Transaminitis/elevated LFTs Unknown specific cause but possibly related to Augmentin versus statin.  Statin and Zetia have been held.  Plan on possibly restarting Zetia given history of diabetes.  Home sleep study was ordered by cardiology due to chronic fatigue and snoring - has not received home sleep test yet. Still some days feeling tired, but overall less fatigue - improving with B12 supplement.    LFTs were downtrending -further improvement on March 1.  Did have evaluation by GI while in hospital, Dr. Paulita Fujita Follow-up with Dr. Paulita Fujita 04/21/2020.  Outside records planned and he plans to see her again for follow up soon. No med changes.   Lab Results  Component Value Date   ALT 33 (H) 04/18/2020   AST 38 04/18/2020   ALKPHOS 59 04/18/2020   BILITOT 0.2 04/18/2020   Chronic low back pain: Given her history of elevated LFTs was continued on oxycodone if needed at last visit. History of L3-4 laminectomy, 1 view imaging of lumbar spine in November 2019 indicated degenerative disc disease at L5-S1 and to a lesser degree at L3-4. Neurosurgeon Dr. Annette Stable.  Has been treated by orthopedics, Dr. Lyla Glassing  for hip issues as well as PT for low back/hip issues in 2021. Saw neurosurgery yesterday. Told had arthritis - referred to pain management in their practice- 2/23? Taking oxycodone 2 times per day usually - occasional third dose. Controlled substance database (PDMP) reviewed. No concerns appreciated.  Last filled 04/19/20.  New med prescribed - unknown name. Notes unavailable at time of visit.  Hyperlipidemia: Off statin (zocor) and zetia since early February.  Lab Results  Component Value Date   CHOL 147 01/21/2020   HDL 48 01/21/2020   LDLCALC 77 01/21/2020   TRIG 124 01/21/2020   CHOLHDL 3.1 01/21/2020   Lab Results  Component Value Date   ALT 33 (H) 04/18/2020   AST 38 04/18/2020   ALKPHOS 59 04/18/2020   BILITOT 0.2 04/18/2020    History Patient Active Problem List   Diagnosis Date Noted  . Diverticulitis 03/26/2020  . Other fatigue 03/05/2019  . H/O total shoulder replacement, left 10/23/2018  . PUD (peptic ulcer disease) 03/21/2018  . Nausea and vomiting 01/11/2018  . Synovial cyst of lumbar facet joint 12/23/2017  . Osteoarthritis of right hip 04/24/2017  . Primary osteoarthritis of right hip 04/24/2017  . Complete rotator cuff tear 05/09/2016  . 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 Dizziness and giddiness 01/04/2015  . Post concussion syndrome 01/04/2015  . Rotator cuff tear, right 11/21/2011   Past Medical History:  Diagnosis Date  . Anxiety   . Carpal tunnel  syndrome   . Deafness in left ear   . Depression   . Diabetes mellitus    diet controlled  . GERD (gastroesophageal reflux disease)   . Hypercholesteremia   . Hypertension   . Hypothyroid   . Memory loss    reports d/t concussion  . PONV (postoperative nausea and vomiting) yrs ago, none recent  . Post concussion syndrome   . Stroke [facility])    reports they were silent   Past Surgical History:  Procedure Laterality Date  . ABDOMINAL HYSTERECTOMY  1986  . BACK SURGERY  2010   lower  . BIOPSY   01/12/2018   Procedure: BIOPSY;  Surgeon: Rush Landmark Telford Nab., MD;  Location: [facility];  Service: Gastroenterology;;  . CHOLECYSTECTOMY    . ESOPHAGOGASTRODUODENOSCOPY (EGD) WITH PROPOFOL N/A 01/12/2018   Procedure: ESOPHAGOGASTRODUODENOSCOPY (EGD) WITH PROPOFOL;  Surgeon: Rush Landmark Telford Nab., MD;  Location: [facility];  Service: Gastroenterology;  Laterality: N/A;  . left elobow surgery  2003  . left rotator cuff  2001  . LUMBAR LAMINECTOMY/DECOMPRESSION MICRODISCECTOMY Left 12/23/2017   Procedure: Laminectomy for facet/synovial cyst - left - Lumbar three-Lumbar four;  Surgeon: Earnie Larsson, MD;  Location: [place];  Service: Neurosurgery;  Laterality: Left;  . r foot surgery  1995  . REVERSE SHOULDER ARTHROPLASTY Left 10/23/2018   Procedure: REVERSE TOTAL SHOULDER ARTHROPLASTY;  Surgeon: Netta Cedars, MD;  Location: [facility];  Service: Orthopedics;  Laterality: Left;  interscalene block  . right hand surgery  2001  . right index finger surgery  2009  . SHOULDER OPEN ROTATOR CUFF REPAIR  11/21/2011   Procedure: ROTATOR CUFF REPAIR SHOULDER OPEN;  Surgeon: Johnn Hai, MD;  Location: [facility];  Service: Orthopedics;  Laterality: Right;  with subacromial decompression  . SHOULDER OPEN ROTATOR CUFF REPAIR Right 05/09/2016   Procedure: Right shoulder mini open revision rotator cuff repair, subacromial decompression;  Surgeon: Susa Day, MD;  Location: [facility];  Service: Orthopedics;  Laterality: Right;  . TOTAL HIP ARTHROPLASTY Right 04/24/2017   Procedure: RIGHT TOTAL HIP ARTHROPLASTY ANTERIOR APPROACH;  Surgeon: Rod Can, MD;  Location: [facility];  Service: Orthopedics;  Laterality: Right;  Needs RNFA   Allergies  Allergen Reactions  . Other Other (See Comments)    Tomato sauce, garlic, onion - severe acid reflux   . Flexeril [Cyclobenzaprine] Other (See Comments)    Per spouse "she felt like a zombie"  . Atorvastatin Other (See Comments)    Unbalanced  .  Chlordiazepoxide-Clidinium Other (See Comments)    Dizziness (intolerance)  . Codeine Nausea Only   Prior to Admission medications   Medication Sig Start Date End Date Taking? Authorizing Provider  alendronate (FOSAMAX) 70 MG tablet Take 1 tablet (70 mg total) by mouth every Sunday. Take with a full glass of water on an empty stomach. 12/27/18  Yes Wendie Agreste, MD  amLODipine (NORVASC) 5 MG tablet Take 1.5 tablets (7.5 mg total) by mouth daily. 01/19/20  Yes Wendie Agreste, MD  Calcium Carbonate-Vitamin D (CALCIUM-D PO) Take 2 tablets by mouth daily. CHEWABLES   Yes [provider]  escitalopram (LEXAPRO) 20 MG tablet Take 1 tablet (20 mg total) by mouth daily. 01/19/20  Yes Wendie Agreste, MD  esomeprazole (NEXIUM) 40 MG capsule Take 1 capsule (40 mg total) by mouth 2 (two) times daily before a meal. 01/13/18  Yes Mikhail, Velta Addison, DO  levothyroxine (SYNTHROID) 75 MCG tablet Take 1 tablet (75 mcg total) by mouth daily with breakfast. 01/19/20  Yes Wendie Agreste, MD  Multiple Vitamins-Minerals (WOMENS MULTI GUMMIES PO) Take 2 tablets by mouth daily.   Yes [provider]  oxybutynin (DITROPAN XL) 15 MG 24 hr tablet Take 1 tablet (15 mg total) by mouth at bedtime. 01/19/20  Yes Wendie Agreste, MD  oxyCODONE (OXY IR/ROXICODONE) 5 MG immediate release tablet Take 1 tablet (5 mg total) by mouth every 6 (six) hours as needed for severe pain. 04/19/20  Yes Wendie Agreste, MD  polyethylene glycol (MIRALAX / GLYCOLAX) 17 g packet Take twice daily till constipation is completely resolved. Then change to once daily or as needed. 03/31/20  Yes Bonnielee Haff, MD  traZODone (DESYREL) 50 MG tablet Take 0.5 tablets (25 mg total) by mouth at bedtime. 01/19/20  Yes Wendie Agreste, MD   Social History   Socioeconomic History  . Marital status: Married    Spouse name: Herschel  . Number of children: 3  . Years of education: 62  . Highest education level: Not on file   Occupational History  . 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 Smoking status: Never Smoker  . Smokeless tobacco: Never Used  Vaping Use  . Vaping Use: Never used  Substance and Sexual Activity  . Alcohol use: Yes    Alcohol/week: 1.0 standard drink    Types: 1 Glasses of wine per week    Comment: occas  . Drug use: No  . Sexual activity: Not Currently    Comment: 1st intercourse 80 yo-Fewer than 5 partners  Other Topics Concern  . Not on file  Social History Narrative   Married, lives at home with husband   caffeine - 1 Coke daily   Social Determinants of Health   Financial Resource Strain: Not on file  Food Insecurity: Not on file  Transportation Needs: Not on file  Physical Activity: Not on file  Stress: Not on file  Social Connections: Not on file  Intimate Partner Violence: Not on file    Review of Systems Per HPI.   Objective:   Vitals:   04/28/20 1121  BP: 121/71  Pulse: 62  Temp: 98 F (36.7 C)  TempSrc: Temporal  SpO2: 96%  Weight: 146 lb (66.2 kg)  Height: 5\' 3"  (1.6 m)     Physical Exam Vitals reviewed.  Constitutional:      Appearance: She is well-developed.  HENT:     Head: Normocephalic and atraumatic.  Eyes:     Conjunctiva/sclera: Conjunctivae normal.     Pupils: Pupils are equal, round, and reactive to light.  Neck:     Vascular: No carotid bruit.  Cardiovascular:     Rate and Rhythm: Normal rate and regular rhythm.     Heart sounds: Normal heart sounds.  Pulmonary:     Effort: Pulmonary effort is normal.     Breath sounds: Normal breath sounds.  Abdominal:     General: There is no distension.     Palpations: Abdomen is soft. There is no pulsatile mass.     Tenderness: There is abdominal tenderness (Minimal right lower quadrant, left lower quadrant without rebound or guarding.). There is no guarding or rebound.  Musculoskeletal:     Comments: Describes area of low back pain diffusely at lower  lumbar spine but no focal bony tenderness.  Ambulating without assistive device or difficulty.  Skin:    General: Skin is warm and dry.  Neurological:     Mental Status: She  is alert and oriented to person, place, and time.  Psychiatric:        Behavior: Behavior normal.     34 minutes spent during visit, greater than 50% counseling and assimilation of information, chart review, and discussion of plan.    Assessment & Plan:  Cheryl Cooper is a 80 y.o. female . Chronic back pain, unspecified back location, unspecified back pain laterality - Plan: oxyCODONE (OXY IR/ROXICODONE) 5 MG immediate release tablet  -Status post evaluation with neurosurgery, plan for pain management follow-up.  We will continue oxycodone for now.  Continue bowel regimen to minimize risk of constipation.  Fatigue, unspecified type  -Improving, still may be multifactorial, recommended home sleep test as previously ordered by cardiology.  Transaminitis - Plan: Comprehensive metabolic panel  -We will restart Zetia, lab only visit in 1 week, 33-month follow-up in office.  History of diverticulitis, appears to be continually improving, minimal discomfort on exam but RTC precautions given.  Continue follow-up with gastroenterology as planned.   Meds ordered this encounter  Medications  . oxyCODONE (OXY IR/ROXICODONE) 5 MG immediate release tablet    Sig: Take 1 tablet (5 mg total) by mouth every 6 (six) hours as needed for severe pain.    Dispense:  20 tablet    Refill:  0   Patient Instructions   Ok to continue the oxycodone until seen by pain management. Make sure to continue stool softener or miralax while taking antibiotics. If diarrhea, can pause miralax for a day or two.  If any new or worsening abdominal pain be seen right away.   Call Dr. Debbe Mounts office about the home sleep test.   Try restarting zetia once per day. Lab visit in 1 week. Recheck with me in 3 months.  Return to the clinic or go to the  nearest emergency room if any of your symptoms worsen or new symptoms occur.   If you have lab work done today you will be contacted with your lab results within the next 2 weeks.  If you have not heard from [place] then please contact us. The fastest way to get your results is to register for My Chart.   IF you received an x-ray today, you will receive an invoice from [facility]. Please contact [facility] at [phone] with questions or concerns regarding your invoice.   IF you received labwork today, you will receive an invoice from [place]. Please contact [facility] at [phone] with questions or concerns regarding your invoice.   Our billing staff will not be able to assist you with questions regarding bills from these companies.  You will be contacted with the lab results as soon as they are available. The fastest way to get your results is to activate your My Chart account. Instructions are located on the last page of this paperwork. If you have not heard from [place] regarding the results in 2 weeks, please contact this office.         Signed, Merri Ray, MD [facility]

## 2020-05-03 ENCOUNTER — Other Ambulatory Visit: Payer: Self-pay

## 2020-05-03 ENCOUNTER — Ambulatory Visit (INDEPENDENT_AMBULATORY_CARE_PROVIDER_SITE_OTHER): Payer: Medicare PPO | Admitting: Family Medicine

## 2020-05-03 DIAGNOSIS — R7401 Elevation of levels of liver transaminase levels: Secondary | ICD-10-CM

## 2020-05-03 DIAGNOSIS — E785 Hyperlipidemia, unspecified: Secondary | ICD-10-CM

## 2020-05-03 MED ORDER — EZETIMIBE 10 MG PO TABS: 10.0000 mg | ORAL_TABLET | Freq: Every day | ORAL | 2 refills | Status: DC

## 2020-05-04 LAB — COMPREHENSIVE METABOLIC PANEL
ALT: 33 IU/L — ABNORMAL HIGH (ref 0–32)
AST: 34 IU/L (ref 0–40)
Albumin/Globulin Ratio: 1.3 (ref 1.2–2.2)
Albumin: 4.1 g/dL (ref 3.7–4.7)
Alkaline Phosphatase: 51 IU/L (ref 44–121)
BUN/Creatinine Ratio: 20 (ref 12–28)
BUN: 18 mg/dL (ref 8–27)
Bilirubin Total: 0.2 mg/dL (ref 0.0–1.2)
CO2: 22 mmol/L (ref 20–29)
Calcium: 9.5 mg/dL (ref 8.7–10.3)
Chloride: 99 mmol/L (ref 96–106)
Creatinine, Ser: 0.91 mg/dL (ref 0.57–1.00)
Globulin, Total: 3.2 g/dL (ref 1.5–4.5)
Glucose: 143 mg/dL — ABNORMAL HIGH (ref 65–99)
Potassium: 4.2 mmol/L (ref 3.5–5.2)
Sodium: 137 mmol/L (ref 134–144)
Total Protein: 7.3 g/dL (ref 6.0–8.5)
eGFR: 65 mL/min/{1.73_m2} (ref >59)

## 2020-05-09 DIAGNOSIS — M47816 Spondylosis without myelopathy or radiculopathy, lumbar region: Secondary | ICD-10-CM | POA: Diagnosis not present

## 2020-05-25 DIAGNOSIS — M47816 Spondylosis without myelopathy or radiculopathy, lumbar region: Secondary | ICD-10-CM | POA: Diagnosis not present

## 2020-06-12 ENCOUNTER — Telehealth: Payer: Self-pay | Admitting: Family Medicine

## 2020-06-12 NOTE — Telephone Encounter (Signed)
..  Type of form received:  Medical Equipment Request   Additional comments:   Received by:    Tye Savoy should be Faxed to:  [phone]  Form should be mailed to:    Is patient requesting call for pickup:   Form placed:  In Dr. Vonna Kotyk bin up front  Attach charge sheet.  Provider will determine charge.  Individual made aware of 3-5 business day turn around (Y/N)?

## 2020-06-20 NOTE — Telephone Encounter (Signed)
Called pt to clarify if she requested this be ordered or if this is a request from her provider. If she is the one requesting this she needs an appointment either with Dr Carlota Raspberry or with ortho to discuss this equipment.

## 2020-06-21 ENCOUNTER — Other Ambulatory Visit: Payer: Self-pay

## 2020-06-22 ENCOUNTER — Ambulatory Visit: Payer: Medicare PPO | Admitting: Family Medicine

## 2020-06-22 ENCOUNTER — Encounter: Payer: Self-pay | Admitting: Family Medicine

## 2020-06-22 VITALS — BP 122/68 | HR 67 | Temp 96.9°F | Ht 63.0 in | Wt 143.0 lb

## 2020-06-22 DIAGNOSIS — E785 Hyperlipidemia, unspecified: Secondary | ICD-10-CM

## 2020-06-22 DIAGNOSIS — M549 Dorsalgia, unspecified: Secondary | ICD-10-CM

## 2020-06-22 DIAGNOSIS — E7800 Pure hypercholesterolemia, unspecified: Secondary | ICD-10-CM

## 2020-06-22 DIAGNOSIS — E119 Type 2 diabetes mellitus without complications: Secondary | ICD-10-CM

## 2020-06-22 DIAGNOSIS — R7401 Elevation of levels of liver transaminase levels: Secondary | ICD-10-CM

## 2020-06-22 DIAGNOSIS — F32A Depression, unspecified: Secondary | ICD-10-CM

## 2020-06-22 DIAGNOSIS — I10 Essential (primary) hypertension: Secondary | ICD-10-CM

## 2020-06-22 DIAGNOSIS — G8929 Other chronic pain: Secondary | ICD-10-CM

## 2020-06-22 LAB — HEPATIC FUNCTION PANEL
ALT: 29 U/L (ref 0–35)
AST: 24 U/L (ref 0–37)
Albumin: 4.1 g/dL (ref 3.5–5.2)
Alkaline Phosphatase: 38 U/L — ABNORMAL LOW (ref 39–117)
Bilirubin, Direct: 0 mg/dL (ref 0.0–0.3)
Total Bilirubin: 0.3 mg/dL (ref 0.2–1.2)
Total Protein: 7.2 g/dL (ref 6.0–8.3)

## 2020-06-22 LAB — BASIC METABOLIC PANEL
BUN: 16 mg/dL (ref 6–23)
CO2: 26 mEq/L (ref 19–32)
Calcium: 9.6 mg/dL (ref 8.4–10.5)
Chloride: 102 mEq/L (ref 96–112)
Creatinine, Ser: 0.92 mg/dL (ref 0.40–1.20)
GFR: 59.85 mL/min — ABNORMAL LOW (ref >60.00)
Glucose, Bld: 114 mg/dL — ABNORMAL HIGH (ref 70–99)
Potassium: 4.2 mEq/L (ref 3.5–5.1)
Sodium: 136 mEq/L (ref 135–145)

## 2020-06-22 LAB — CBC
HCT: 41.1 % (ref 36.0–46.0)
Hemoglobin: 13.6 g/dL (ref 12.0–15.0)
MCHC: 33 g/dL (ref 30.0–36.0)
MCV: 83.2 fl (ref 78.0–100.0)
Platelets: 179 10*3/uL (ref 150.0–400.0)
RBC: 4.94 Mil/uL (ref 3.87–5.11)
RDW: 16.4 % — ABNORMAL HIGH (ref 11.5–15.5)
WBC: 6.8 10*3/uL (ref 4.0–10.5)

## 2020-06-22 LAB — LDL CHOLESTEROL, DIRECT: Direct LDL: 189 mg/dL

## 2020-06-22 MED ORDER — TRAZODONE HCL 50 MG PO TABS: 25.0000 mg | ORAL_TABLET | Freq: Every day | ORAL | 1 refills | Status: DC

## 2020-06-22 MED ORDER — EZETIMIBE 10 MG PO TABS: 10.0000 mg | ORAL_TABLET | Freq: Every day | ORAL | 2 refills | Status: DC

## 2020-06-22 NOTE — Progress Notes (Addendum)
Established Patient Office Visit  Subjective:  Patient ID: Cheryl Cooper, female    DOB: [birth year]  Age: 80 y.o. MRN: [id number]  CC:  Chief Complaint  Patient presents with  . Establish Care    NP/establish care, refill on medications patient not fasting.     HPI Cheryl Cooper presents for establishment of care by way of transfer.  I saw her husband earlier today.  Past medical history of hypertension, elevated cholesterol, diet-controlled diabetes, hypothyroidism, chronic lower back pain and transamanita's.  Liver enzymes had been elevated earlier this year.  Simvastatin was discontinued and then slowly return to normal.  She continues to take Zetia for her cholesterol.  She is seeing Dr. Annette Stable at this point for chronic back pain.  Past Medical History:  Diagnosis Date  . Anxiety   . Carpal tunnel syndrome   . Deafness in left ear   . Depression   . Diabetes mellitus    diet controlled  . GERD (gastroesophageal reflux disease)   . Hypercholesteremia   . Hypertension   . Hypothyroid   . Memory loss    reports d/t concussion  . PONV (postoperative nausea and vomiting) yrs ago, none recent  . Post concussion syndrome   . Stroke [facility])    reports they were silent    Past Surgical History:  Procedure Laterality Date  . ABDOMINAL HYSTERECTOMY  1986  . BACK SURGERY  2010   lower  . BIOPSY  01/12/2018   Procedure: BIOPSY;  Surgeon: Rush Landmark Telford Nab., MD;  Location: [facility];  Service: Gastroenterology;;  . CHOLECYSTECTOMY    . ESOPHAGOGASTRODUODENOSCOPY (EGD) WITH PROPOFOL N/A 01/12/2018   Procedure: ESOPHAGOGASTRODUODENOSCOPY (EGD) WITH PROPOFOL;  Surgeon: Rush Landmark Telford Nab., MD;  Location: [facility];  Service: Gastroenterology;  Laterality: N/A;  . left elobow surgery  2003  . left rotator cuff  2001  . LUMBAR LAMINECTOMY/DECOMPRESSION MICRODISCECTOMY Left 12/23/2017   Procedure: Laminectomy for facet/synovial cyst - left - Lumbar three-Lumbar four;   Surgeon: Earnie Larsson, MD;  Location: [facility];  Service: Neurosurgery;  Laterality: Left;  . r foot surgery  1995  . REVERSE SHOULDER ARTHROPLASTY Left 10/23/2018   Procedure: REVERSE TOTAL SHOULDER ARTHROPLASTY;  Surgeon: Netta Cedars, MD;  Location: [facility];  Service: Orthopedics;  Laterality: Left;  interscalene block  . right hand surgery  2001  . right index finger surgery  2009  . SHOULDER OPEN ROTATOR CUFF REPAIR  11/21/2011   Procedure: ROTATOR CUFF REPAIR SHOULDER OPEN;  Surgeon: Johnn Hai, MD;  Location: [facility];  Service: Orthopedics;  Laterality: Right;  with subacromial decompression  . SHOULDER OPEN ROTATOR CUFF REPAIR Right 05/09/2016   Procedure: Right shoulder mini open revision rotator cuff repair, subacromial decompression;  Surgeon: Susa Day, MD;  Location: [facility];  Service: Orthopedics;  Laterality: Right;  . TOTAL HIP ARTHROPLASTY Right 04/24/2017   Procedure: RIGHT TOTAL HIP ARTHROPLASTY ANTERIOR APPROACH;  Surgeon: Rod Can, MD;  [facility];  Service: Orthopedics;  Laterality: Right;  Needs RNFA    Family History  Problem Relation Age of Onset  . Heart attack Mother   . Stroke Father   . Heart attack Father   . Stroke Sister   . Stroke Brother   . Diabetes Brother   . Dementia Brother   . Heart disease Brother   . Stroke Sister        TIA    Social History   Socioeconomic History  . Marital status: Married  Spouse name: Herschel  . Number of children: 3  . Years of education: 60  . Highest education level: Not on file  Occupational History  . 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 Smoking status: Never Smoker  . Smokeless tobacco: Never Used  Vaping Use  . Vaping Use: Never used  Substance and Sexual Activity  . Alcohol use: Yes    Alcohol/week: 1.0 standard drink    Types: 1 Glasses of wine per week    Comment: occas  . Drug use: No  . Sexual activity: Not Currently    Comment: 1st intercourse  80 yo-Fewer than 5 partners  Other Topics Concern  . Not on file  Social History Narrative   Married, lives at home with husband   caffeine - 1 Coke daily   Social Determinants of Health   Financial Resource Strain: Not on file  Food Insecurity: Not on file  Transportation Needs: Not on file  Physical Activity: Not on file  Stress: Not on file  Social Connections: Not on file  Intimate Partner Violence: Not on file    Outpatient Medications Prior to Visit  Medication Sig Dispense Refill  . alendronate (FOSAMAX) 70 MG tablet Take 1 tablet (70 mg total) by mouth every Sunday. Take with a full glass of water on an empty stomach. 12 tablet 3  . amLODipine (NORVASC) 5 MG tablet Take 1.5 tablets (7.5 mg total) by mouth daily. 135 tablet 1  . Calcium Carbonate-Vitamin D (CALCIUM-D PO) Take 2 tablets by mouth daily. CHEWABLES    . escitalopram (LEXAPRO) 20 MG tablet Take 1 tablet (20 mg total) by mouth daily. 90 tablet 1  . esomeprazole (NEXIUM) 40 MG capsule Take 1 capsule (40 mg total) by mouth 2 (two) times daily before a meal. 60 capsule 0  . levothyroxine (SYNTHROID) 75 MCG tablet Take 1 tablet (75 mcg total) by mouth daily with breakfast. 90 tablet 2  . Multiple Vitamins-Minerals (WOMENS MULTI GUMMIES PO) Take 2 tablets by mouth daily.    [name] ezetimibe (ZETIA) 10 MG tablet Take 1 tablet (10 mg total) by mouth daily. 90 tablet 2  . polyethylene glycol (MIRALAX / GLYCOLAX) 17 g packet Take twice daily till constipation is completely resolved. Then change to once daily or as needed. 60 each 0  . traZODone (DESYREL) 50 MG tablet Take 0.5 tablets (25 mg total) by mouth at bedtime. 45 tablet 1  . oxyCODONE (OXY IR/ROXICODONE) 5 MG immediate release tablet Take 1 tablet (5 mg total) by mouth every 6 (six) hours as needed for severe pain. (Patient not taking: Reported on 06/22/2020) 20 tablet 0  . oxybutynin (DITROPAN XL) 15 MG 24 hr tablet Take 1 tablet (15 mg total) by mouth at bedtime. (Patient  not taking: Reported on 06/22/2020) 90 tablet 2   No facility-administered medications prior to visit.    Allergies  Allergen Reactions  . Other Other (See Comments) and Rash    Tomato sauce, garlic, onion - severe acid reflux   . Flexeril [Cyclobenzaprine] Other (See Comments)    Per spouse "she felt like a zombie"  . Atorvastatin Other (See Comments)    Unbalanced  . Chlordiazepoxide-Clidinium Other (See Comments)    Dizziness (intolerance)  . Codeine Nausea Only and Rash    ROS Review of Systems  Constitutional: Negative.   HENT: Negative.   Respiratory: Negative.   Cardiovascular: Negative.   Gastrointestinal: Negative.   Genitourinary: Negative.  Musculoskeletal: Positive for arthralgias, back pain and gait problem.  Neurological: Negative for speech difficulty and weakness.  Psychiatric/Behavioral: Negative.    Depression screen [facility] 2/9 06/22/2020 04/28/2020 04/05/2020  Decreased Interest 0 0 0  Down, Depressed, Hopeless 0 0 0  PHQ - 2 Score 0 0 0  Altered sleeping - - -  Tired, decreased energy - - -  Change in appetite - - -  Feeling bad or failure about yourself  - - -  Trouble concentrating - - -  Moving slowly or fidgety/restless - - -  Suicidal thoughts - - -  PHQ-9 Score - - -      Objective:    Physical Exam Vitals and nursing note reviewed.  Constitutional:      General: She is not in acute distress.    Appearance: Normal appearance. She is not ill-appearing, toxic-appearing or diaphoretic.  HENT:     Head: Normocephalic and atraumatic.     Right Ear: Tympanic membrane, ear canal and external ear normal.     Left Ear: Tympanic membrane, ear canal and external ear normal.     Mouth/Throat:     Mouth: Mucous membranes are moist.     Pharynx: Oropharynx is clear. No oropharyngeal exudate or posterior oropharyngeal erythema.  Eyes:     General:        Right eye: No discharge.        Left eye: No discharge.     Extraocular Movements: Extraocular  movements intact.     Conjunctiva/sclera: Conjunctivae normal.     Pupils: Pupils are equal, round, and reactive to light.  Neck:     Vascular: No carotid bruit.  Cardiovascular:     Rate and Rhythm: Normal rate and regular rhythm.     Heart sounds: Murmur heard.    Pulmonary:     Effort: Pulmonary effort is normal.     Breath sounds: Normal breath sounds.  Abdominal:     General: Bowel sounds are normal.  Musculoskeletal:     Cervical back: No rigidity or tenderness.  Lymphadenopathy:     Cervical: No cervical adenopathy.  Neurological:     Mental Status: She is alert and oriented to person, place, and time.  Psychiatric:        Mood and Affect: Mood normal.        Behavior: Behavior normal.     BP 122/68   Pulse 67   Temp (!) 96.9 F (36.1 C) (Temporal)   Ht 5' 3"  (1.6 m)   Wt 143 lb (64.9 kg)   SpO2 95%   BMI 25.33 kg/m  Wt Readings from Last 3 Encounters:  06/22/20 143 lb (64.9 kg)  04/28/20 146 lb (66.2 kg)  04/14/20 147 lb (66.7 kg)     Health Maintenance Due  Topic Date Due  . FOOT EXAM  Never done  . OPHTHALMOLOGY EXAM  Never done  . URINE MICROALBUMIN  07/25/2020    There are no preventive care reminders to display for this patient.  Lab Results  Component Value Date   TSH 3.740 01/21/2020   Lab Results  Component Value Date   WBC 6.8 06/22/2020   HGB 13.6 06/22/2020   HCT 41.1 06/22/2020   MCV 83.2 06/22/2020   PLT 179.0 06/22/2020   Lab Results  Component Value Date   NA 136 06/22/2020   K 4.2 06/22/2020   CO2 26 06/22/2020   GLUCOSE 114 (H) 06/22/2020   BUN 16 06/22/2020   CREATININE  0.92 06/22/2020   BILITOT 0.3 06/22/2020   ALKPHOS 38 (L) 06/22/2020   AST 24 06/22/2020   ALT 29 06/22/2020   PROT 7.2 06/22/2020   ALBUMIN 4.1 06/22/2020   CALCIUM 9.6 06/22/2020   ANIONGAP 9 03/31/2020   EGFR 65 05/03/2020   GFR 59.85 (L) 06/22/2020   Lab Results  Component Value Date   CHOL 147 01/21/2020   Lab Results  Component  Value Date   HDL 48 01/21/2020   Lab Results  Component Value Date   LDLCALC 77 01/21/2020   Lab Results  Component Value Date   TRIG 124 01/21/2020   Lab Results  Component Value Date   CHOLHDL 3.1 01/21/2020   Lab Results  Component Value Date   HGBA1C 6.7 (H) 01/21/2020      Assessment & Plan:   Problem List Items Addressed This Visit      Endocrine   Type 2 diabetes, diet controlled ([place])   Relevant Medications   rosuvastatin (CRESTOR) 10 MG tablet   Other Relevant Orders   Basic metabolic panel (Completed)     Other   Hyperlipidemia   Relevant Medications   ezetimibe (ZETIA) 10 MG tablet   rosuvastatin (CRESTOR) 10 MG tablet   Other Relevant Orders   Hepatic function panel (Completed)   Chronic back pain   Relevant Medications   traZODone (DESYREL) 50 MG tablet   Transaminitis - Primary   Relevant Orders   Hepatic function panel (Completed)   CBC (Completed)    Other Visit Diagnoses    Depression, unspecified depression type       Relevant Medications   traZODone (DESYREL) 50 MG tablet   Essential hypertension       Relevant Medications   ezetimibe (ZETIA) 10 MG tablet   rosuvastatin (CRESTOR) 10 MG tablet      Meds ordered this encounter  Medications  . ezetimibe (ZETIA) 10 MG tablet    Sig: Take 1 tablet (10 mg total) by mouth daily.    Dispense:  90 tablet    Refill:  2  . traZODone (DESYREL) 50 MG tablet    Sig: Take 0.5 tablets (25 mg total) by mouth at bedtime.    Dispense:  45 tablet    Refill:  1  . rosuvastatin (CRESTOR) 10 MG tablet    Sig: Take 1 tablet (10 mg total) by mouth daily.    Dispense:  90 tablet    Refill:  0    Follow-up: Return in about 3 months (around 09/22/2020).  Advised patient to be using her cane at least for all ambulation.  May consider rechallenge in her with a different statin in the future.  Rechecking LFTs  Libby Maw, MD

## 2020-06-22 NOTE — Telephone Encounter (Signed)
Mark with [facility] is calling back. I have informed him that the patient would need an appt.    Elta Guadeloupe is insistent that Dr. Carlota Raspberry is the phone seeing patient for this condition.  Is requesting a call back at [phone].

## 2020-06-25 MED ORDER — ROSUVASTATIN CALCIUM 10 MG PO TABS: 10.0000 mg | ORAL_TABLET | Freq: Every day | ORAL | 0 refills | Status: DC

## 2020-06-25 NOTE — Addendum Note (Signed)
Addended by: Jon Billings on: 06/25/2020 08:07 PM   Modules accepted: Orders

## 2020-06-28 ENCOUNTER — Other Ambulatory Visit: Payer: Self-pay

## 2020-06-28 ENCOUNTER — Other Ambulatory Visit: Payer: Self-pay | Admitting: Gastroenterology

## 2020-06-28 ENCOUNTER — Ambulatory Visit
Admission: RE | Admit: 2020-06-28 | Discharge: 2020-06-28 | Disposition: A | Discharge status: Home / Self Care | Payer: Medicare PPO | Source: Ambulatory Visit | Attending: Gastroenterology | Admitting: Gastroenterology

## 2020-06-28 DIAGNOSIS — K5900 Constipation, unspecified: Secondary | ICD-10-CM

## 2020-06-29 NOTE — Telephone Encounter (Signed)
New paperwork received for shoulder orthosis. She is now under the care of Dr. Ethelene Hal as her PCP. She has been under the care of [facility], with a visit by Dr. Veverly Fells recently for her left shoulder.  I would recommend any specific bracing recommendations come form her orthopaedist.

## 2020-07-10 ENCOUNTER — Other Ambulatory Visit: Payer: Self-pay

## 2020-07-11 ENCOUNTER — Ambulatory Visit: Payer: Medicare PPO | Admitting: Family Medicine

## 2020-07-11 ENCOUNTER — Encounter: Payer: Self-pay | Admitting: Family Medicine

## 2020-07-11 VITALS — BP 122/68 | HR 58 | Temp 96.0°F | Wt 144.0 lb

## 2020-07-11 DIAGNOSIS — W57XXXA Bitten or stung by nonvenomous insect and other nonvenomous arthropods, initial encounter: Secondary | ICD-10-CM | POA: Insufficient documentation

## 2020-07-11 DIAGNOSIS — R7401 Elevation of levels of liver transaminase levels: Secondary | ICD-10-CM

## 2020-07-11 DIAGNOSIS — E785 Hyperlipidemia, unspecified: Secondary | ICD-10-CM

## 2020-07-11 DIAGNOSIS — S30861A Insect bite (nonvenomous) of abdominal wall, initial encounter: Secondary | ICD-10-CM | POA: Diagnosis not present

## 2020-07-11 MED ORDER — DOXYCYCLINE HYCLATE 100 MG PO TABS: 100.0000 mg | ORAL_TABLET | Freq: Two times a day (BID) | ORAL | 0 refills | Status: AC

## 2020-07-11 NOTE — Progress Notes (Signed)
Established Patient Office Visit  Subjective:  Patient ID: Cheryl Cooper, female    DOB: [birth year]  Age: 80 y.o. MRN: [id number]  CC:  Chief Complaint  Patient presents with  . Tick Removal    Tick bite on right side, area still very red and itchy. Frequent headaches becoming worse.     HPI Cheryl Cooper presents for evaluation of a tick bite that she discovered 2 days ago.  Her husband removed the tick and told her that it was a small deer tick.  She has since developed a rash around the bite.  She denies fevers but has had some headaches.  There has been some soreness in the neck area.  Denies other joint pains.  She is taking the rosuvastatin without issue.  Past Medical History:  Diagnosis Date  . Anxiety   . Carpal tunnel syndrome   . Deafness in left ear   . Depression   . Diabetes mellitus    diet controlled  . GERD (gastroesophageal reflux disease)   . Hypercholesteremia   . Hypertension   . Hypothyroid   . Memory loss    reports d/t concussion  . PONV (postoperative nausea and vomiting) yrs ago, none recent  . Post concussion syndrome   . Stroke [facility])    reports they were silent    Past Surgical History:  Procedure Laterality Date  . ABDOMINAL HYSTERECTOMY  1986  . BACK SURGERY  2010   lower  . BIOPSY  01/12/2018   Procedure: BIOPSY;  Surgeon: Rush Landmark Telford Nab., MD;  Location: [facility];  Service: Gastroenterology;;  . CHOLECYSTECTOMY    . ESOPHAGOGASTRODUODENOSCOPY (EGD) WITH PROPOFOL N/A 01/12/2018   Procedure: ESOPHAGOGASTRODUODENOSCOPY (EGD) WITH PROPOFOL;  Surgeon: Rush Landmark Telford Nab., MD;  [facility];  Service: Gastroenterology;  Laterality: N/A;  . left elobow surgery  2003  . left rotator cuff  2001  . LUMBAR LAMINECTOMY/DECOMPRESSION MICRODISCECTOMY Left 12/23/2017   Procedure: Laminectomy for facet/synovial cyst - left - Lumbar three-Lumbar four;  Surgeon: Earnie Larsson, MD;  Location: [facility];  Service: Neurosurgery;   Laterality: Left;  . r foot surgery  1995  . REVERSE SHOULDER ARTHROPLASTY Left 10/23/2018   Procedure: REVERSE TOTAL SHOULDER ARTHROPLASTY;  Surgeon: Netta Cedars, MD;  [facility];  Service: Orthopedics;  Laterality: Left;  interscalene block  . right hand surgery  2001  . right index finger surgery  2009  . SHOULDER OPEN ROTATOR CUFF REPAIR  11/21/2011   Procedure: ROTATOR CUFF REPAIR SHOULDER OPEN;  Surgeon: Johnn Hai, MD;  [facility];  Service: Orthopedics;  Laterality: Right;  with subacromial decompression  . SHOULDER OPEN ROTATOR CUFF REPAIR Right 05/09/2016   Procedure: Right shoulder mini open revision rotator cuff repair, subacromial decompression;  Surgeon: Susa Day, MD;  [facility];  Service: Orthopedics;  Laterality: Right;  . TOTAL HIP ARTHROPLASTY Right 04/24/2017   Procedure: RIGHT TOTAL HIP ARTHROPLASTY ANTERIOR APPROACH;  Surgeon: Rod Can, MD;  [facility];  Service: Orthopedics;  Laterality: Right;  Needs RNFA    Family History  Problem Relation Age of Onset  . Heart attack Mother   . Stroke Father   . Heart attack Father   . Stroke Sister   . Stroke Brother   . Diabetes Brother   . Dementia Brother   . Heart disease Brother   . Stroke Sister        TIA    Social History   Socioeconomic History  .  Marital status: Married    Spouse name: Herschel  . Number of children: 3  . Years of education: 44  . Highest education level: Not on file  Occupational History  . 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 Smoking status: Never Smoker  . Smokeless tobacco: Never Used  Vaping Use  . Vaping Use: Never used  Substance and Sexual Activity  . Alcohol use: Yes    Alcohol/week: 1.0 standard drink    Types: 1 Glasses of wine per week    Comment: occas  . Drug use: No  . Sexual activity: Not Currently    Comment: 1st intercourse 80 yo-Fewer than 5 partners  Other Topics Concern  . Not on file   Social History Narrative   Married, lives at home with husband   caffeine - 1 Coke daily   Social Determinants of Health   Financial Resource Strain: Not on file  Food Insecurity: Not on file  Transportation Needs: Not on file  Physical Activity: Not on file  Stress: Not on file  Social Connections: Not on file  Intimate Partner Violence: Not on file    Outpatient Medications Prior to Visit  Medication Sig Dispense Refill  . alendronate (FOSAMAX) 70 MG tablet Take 1 tablet (70 mg total) by mouth every Sunday. Take with a full glass of water on an empty stomach. 12 tablet 3  . amLODipine (NORVASC) 5 MG tablet Take 1.5 tablets (7.5 mg total) by mouth daily. 135 tablet 1  . Calcium Carbonate-Vitamin D (CALCIUM-D PO) Take 2 tablets by mouth daily. CHEWABLES    . escitalopram (LEXAPRO) 20 MG tablet Take 1 tablet (20 mg total) by mouth daily. 90 tablet 1  . esomeprazole (NEXIUM) 40 MG capsule Take 1 capsule (40 mg total) by mouth 2 (two) times daily before a meal. 60 capsule 0  . ezetimibe (ZETIA) 10 MG tablet Take 1 tablet (10 mg total) by mouth daily. 90 tablet 2  . levothyroxine (SYNTHROID) 75 MCG tablet Take 1 tablet (75 mcg total) by mouth daily with breakfast. 90 tablet 2  . Multiple Vitamins-Minerals (WOMENS MULTI GUMMIES PO) Take 2 tablets by mouth daily.    . rosuvastatin (CRESTOR) 10 MG tablet Take 1 tablet (10 mg total) by mouth daily. 90 tablet 0  . traZODone (DESYREL) 50 MG tablet Take 0.5 tablets (25 mg total) by mouth at bedtime. 45 tablet 1  . oxyCODONE (OXY IR/ROXICODONE) 5 MG immediate release tablet Take 1 tablet (5 mg total) by mouth every 6 (six) hours as needed for severe pain. (Patient not taking: No sig reported) 20 tablet 0   No facility-administered medications prior to visit.    Allergies  Allergen Reactions  . Other Other (See Comments) and Rash    Tomato sauce, garlic, onion - severe acid reflux   . Flexeril [Cyclobenzaprine] Other (See Comments)    Per  spouse "she felt like a zombie"  . Atorvastatin Other (See Comments)    Unbalanced  . Chlordiazepoxide-Clidinium Other (See Comments)    Dizziness (intolerance)  . Codeine Nausea Only and Rash    ROS Review of Systems  Constitutional: Negative for chills, diaphoresis, fatigue, fever and unexpected weight change.  HENT: Negative.   Respiratory: Negative.   Cardiovascular: Negative.   Gastrointestinal: Negative.   Musculoskeletal: Positive for neck pain. Negative for arthralgias, myalgias and neck stiffness.  Neurological: Positive for headaches. Negative for weakness.      Objective:  Physical Exam Vitals and nursing note reviewed.  Constitutional:      General: She is not in acute distress.    Appearance: Normal appearance. She is normal weight. She is not ill-appearing, toxic-appearing or diaphoretic.  HENT:     Head: Normocephalic and atraumatic.     Right Ear: External ear normal.     Left Ear: External ear normal.  Eyes:     General: No scleral icterus.       Right eye: No discharge.        Left eye: No discharge.     Conjunctiva/sclera: Conjunctivae normal.  Cardiovascular:     Rate and Rhythm: Normal rate and regular rhythm.  Pulmonary:     Effort: Pulmonary effort is normal.     Breath sounds: Normal breath sounds.  Skin:    Coloration: Skin is not jaundiced.       Neurological:     Mental Status: She is alert and oriented to person, place, and time.  Psychiatric:        Mood and Affect: Mood normal.        Behavior: Behavior normal.     BP 122/68   Pulse (!) 58   Temp (!) 96 F (35.6 C) (Temporal)   Wt 144 lb (65.3 kg)   SpO2 96%   BMI 25.51 kg/m  Wt Readings from Last 3 Encounters:  07/11/20 144 lb (65.3 kg)  06/22/20 143 lb (64.9 kg)  04/28/20 146 lb (66.2 kg)     Health Maintenance Due  Topic Date Due  . FOOT EXAM  Never done  . OPHTHALMOLOGY EXAM  Never done  . URINE MICROALBUMIN  07/25/2020    There are no preventive care  reminders to display for this patient.  Lab Results  Component Value Date   TSH 3.740 01/21/2020   Lab Results  Component Value Date   WBC 6.8 06/22/2020   HGB 13.6 06/22/2020   HCT 41.1 06/22/2020   MCV 83.2 06/22/2020   PLT 179.0 06/22/2020   Lab Results  Component Value Date   NA 136 06/22/2020   K 4.2 06/22/2020   CO2 26 06/22/2020   GLUCOSE 114 (H) 06/22/2020   BUN 16 06/22/2020   CREATININE 0.92 06/22/2020   BILITOT 0.3 06/22/2020   ALKPHOS 38 (L) 06/22/2020   AST 24 06/22/2020   ALT 29 06/22/2020   PROT 7.2 06/22/2020   ALBUMIN 4.1 06/22/2020   CALCIUM 9.6 06/22/2020   ANIONGAP 9 03/31/2020   EGFR 65 05/03/2020   GFR 59.85 (L) 06/22/2020   Lab Results  Component Value Date   CHOL 147 01/21/2020   Lab Results  Component Value Date   HDL 48 01/21/2020   Lab Results  Component Value Date   LDLCALC 77 01/21/2020   Lab Results  Component Value Date   TRIG 124 01/21/2020   Lab Results  Component Value Date   CHOLHDL 3.1 01/21/2020   Lab Results  Component Value Date   HGBA1C 6.7 (H) 01/21/2020      Assessment & Plan:   Problem List Items Addressed This Visit      Musculoskeletal and Integument   Tick bite of abdomen - Primary   Relevant Medications   doxycycline (VIBRA-TABS) 100 MG tablet     Other   Hyperlipidemia   Transaminitis      Meds ordered this encounter  Medications  . doxycycline (VIBRA-TABS) 100 MG tablet    Sig: Take 1 tablet (100 mg total) by  mouth 2 (two) times daily for 10 days.    Dispense:  20 tablet    Refill:  0    Follow-up: Return if symptoms worsen or fail to improve, for and return for liver function panel 2 weeks after finishing doxy..   Will start Doxy.  Patient warned to avoid sun exposure and to take the medicine with food.  Return if her symptoms worsen.  She will also return in approximately 2 weeks for repeat hepatic function panel.   Libby Maw, MD

## 2020-07-11 NOTE — Patient Instructions (Signed)
Tick Bite Information, Adult Ticks are insects that draw blood for food. Most ticks live in shrubs and grassy and wooded areas. They climb onto people and animals that brush against the leaves and grasses that they rest on. Then they bite, attaching themselves to the skin. Most ticks are harmless, but some ticks may carry germs that can spread to a person through a bite and cause a disease. To reduce your risk of getting a disease from a tick bite, make sure you:  Take steps to prevent tick bites.  Check for ticks after being outdoors where ticks live.  Watch for symptoms of disease if a tick attached to you or if you suspect a tick bite. How can I prevent tick bites? Take these steps to help prevent tick bites when you go outdoors in an area where ticks live: Use insect repellent  Use insect repellent that has DEET (20% or higher), picaridin, or IR3535 in it. Follow the instructions on the label. Use these products on: ? Bare skin. ? The top of your boots. ? Your pant legs. ? Your sleeve cuffs.  For insect repellent that contains permethrin, follow the instructions on the label. Use these products on: ? Clothing. ? Boots. ? Outdoor gear. ? Tents. When you are outside  Wear protective clothing. Long sleeves and long pants offer the best protection from ticks.  Wear light-colored clothing so you can see ticks more easily.  Tuck your pant legs into your socks.  If you go walking on a trail, stay in the middle of the trail so your skin, hair, and clothing do not touch the bushes.  Avoid walking through areas with long grass.  Check for ticks on your clothing, hair, and skin often while you are outside, and check again before you go inside. Make sure to check the scalp, neck, armpits, waist, groin, and joint areas. These are the spots where ticks attach themselves most often. When you go indoors  Check your clothing for ticks. Tumble dry clothes in a dryer on high heat for at least  10 minutes. If clothes are damp, additional time may be needed. If clothes require washing, use hot water.  Examine gear and pets.  Shower soon after being outdoors.  Check your body for ticks. Conduct a full body check using a mirror. What is the proper way to remove a tick? If you find a tick on your body, remove it as soon as possible. Removing a tick sooner can prevent germs from passing to your body. Do not remove the tick with your bare fingers. To remove a tick that is crawling on your skin but has not bitten, use either of these methods:  Go outdoors and brush the tick off.  Remove the tick with tape or a lint roller. To remove a tick that is attached to your skin: 1. Wash your hands. If you have latex gloves, put them on. 2. Use fine-tipped tweezers, curved forceps, or a tick-removal tool to gently grasp the tick as close to your skin and the tick's head as possible. 3. Gently pull with a steady, upward, even pressure until the tick lets go. 4. When removing the tick: ? Take care to keep the tick's head attached to its body. ? Do not twist or jerk the tick. This can make the tick's head or mouth parts break off and remain in the skin. ? Do not squeeze or crush the tick's body. This could force disease-carrying fluids from the tick  into your body. Do not try to remove a tick with heat, alcohol, petroleum jelly, or fingernail polish. Using these methods can cause the tick to salivate and regurgitate into your bloodstream, increasing your risk of getting a disease.   What should I do after removing a tick?  Dispose of the tick. Do not crush a tick with your fingers.  Clean the bite area and your hands with soap and water, rubbing alcohol, or an iodine scrub.  If an antiseptic cream or ointment is available, apply a small amount to the bite site.  Wash and disinfect any instruments that you used to remove the tick. How should I dispose of a tick? To dispose of a live tick, use  one of these methods:  Place it in rubbing alcohol.  Place it in a sealed bag or container.  Wrap it tightly in tape.  Flush it down the toilet. Contact a health care provider if:  You have symptoms of a disease after a tick bite. Symptoms of a tick-borne disease can occur from moments after the tick bites to 30 days after a tick is removed. Symptoms include: ? Fever or chills. ? Any of these signs in the bite area:  A red rash that makes a circle (bull's-eye rash) in the bite area.  Redness and swelling. ? Headache. ? Muscle, joint, or bone pain. ? Abnormal tiredness. ? Numbness in your legs or difficulty walking or moving your legs. ? Tender, swollen lymph glands.  A part of a tick breaks off and gets stuck in your skin. Get help right away if:  You are not able to remove a tick.  You experience muscle weakness or paralysis.  Your symptoms get worse or you experience new symptoms.  You find an engorged tick on your skin and you are in an area where disease from ticks is a high risk. Summary  Ticks may carry germs that can spread to a person through a bite and cause a disease.  Wear protective clothing and use insect repellent to prevent tick bites. Follow the instructions on the label.  If you find a tick on your body, remove it as soon as possible. If the tick is attached, do not try to remove with heat, alcohol, petroleum jelly, or fingernail polish.  Remove the attached tick using fine-tipped tweezers, curved forceps, or a tick-removal tool. Gently pull with steady, upward, even pressure until the tick lets go. Do not twist or jerk the tick. Do not squeeze or crush the tick's body.  If you have symptoms of a disease after being bitten by a tick, contact a health care provider. This information is not intended to replace advice given to you by your health care provider. Make sure you discuss any questions you have with your health care provider. Document Revised:  02/01/2019 Document Reviewed: 02/01/2019 Elsevier Patient Education  2021 [place]. Doxycycline tablets or capsules What is this medicine? DOXYCYCLINE (dox i SYE kleen) is a tetracycline antibiotic. It kills certain bacteria or stops their growth. It is used to treat many kinds of infections, like dental, skin, respiratory, and urinary tract infections. It also treats acne, Lyme disease, malaria, and certain sexually transmitted infections. This medicine may be used for other purposes; ask your health care provider or pharmacist if you have questions. COMMON BRAND NAME(S): Acticlate, Adoxa, Adoxa CK, Adoxa Pak, Adoxa TT, Alodox, Avidoxy, Doxal, LYMEPAK, Mondoxyne NL, Monodox, Morgidox 1x, Morgidox 1x Kit, Morgidox 2x, Morgidox 2x Kit, NutriDox, [place], [place],  Periostat, TARGADOX, Vibra-Tabs, Vibramycin What should I tell my health care provider before I take this medicine? They need to know if you have any of these conditions:  liver disease  long exposure to sunlight like working outdoors  stomach problems like colitis  an unusual or allergic reaction to doxycycline, tetracycline antibiotics, other medicines, foods, dyes, or preservatives  pregnant or trying to get pregnant  breast-feeding How should I use this medicine? Take this medicine by mouth with a full glass of water. Follow the directions on the prescription label. It is best to take this medicine without food, but if it upsets your stomach take it with food. Take your medicine at regular intervals. Do not take your medicine more often than directed. Take all of your medicine as directed even if you think you are better. Do not skip doses or stop your medicine early. Talk to your pediatrician regarding the use of this medicine in children. While this drug may be prescribed for selected conditions, precautions do apply. Overdosage: If you think you have taken too much of this medicine contact a poison control center or emergency  room at once. NOTE: This medicine is only for you. Do not share this medicine with others. What if I miss a dose? If you miss a dose, take it as soon as you can. If it is almost time for your next dose, take only that dose. Do not take double or extra doses. What may interact with this medicine?  antacids  barbiturates  birth control pills  bismuth subsalicylate  carbamazepine  methoxyflurane  other antibiotics  phenytoin  vitamins that contain iron  warfarin This list may not describe all possible interactions. Give your health care provider a list of all the medicines, herbs, non-prescription drugs, or dietary supplements you use. Also tell them if you smoke, drink alcohol, or use illegal drugs. Some items may interact with your medicine. What should I watch for while using this medicine? Tell your doctor or health care professional if your symptoms do not improve. Do not treat diarrhea with over the counter products. Contact your doctor if you have diarrhea that lasts more than 2 days or if it is severe and watery. Do not take this medicine just before going to bed. It may not dissolve properly when you lay down and can cause pain in your throat. Drink plenty of fluids while taking this medicine to also help reduce irritation in your throat. This medicine can make you more sensitive to the sun. Keep out of the sun. If you cannot avoid being in the sun, wear protective clothing and use sunscreen. Do not use sun lamps or tanning beds/booths. Birth control pills may not work properly while you are taking this medicine. Talk to your doctor about using an extra method of birth control. If you are being treated for a sexually transmitted infection, avoid sexual contact until you have finished your treatment. Your sexual partner may also need treatment. Avoid antacids, aluminum, calcium, magnesium, and iron products for 4 hours before and 2 hours after taking a dose of this medicine. If  you are using this medicine to prevent malaria, you should still protect yourself from contact with mosquitos. Stay in screened-in areas, use mosquito nets, keep your body covered, and use an insect repellent. What side effects may I notice from receiving this medicine? Side effects that you should report to your doctor or health care professional as soon as possible:  allergic reactions like skin rash, itching  or hives, swelling of the face, lips, or tongue  difficulty breathing  fever  itching in the rectal or genital area  pain on swallowing  rash, fever, and swollen lymph nodes  redness, blistering, peeling or loosening of the skin, including inside the mouth  severe stomach pain or cramps  unusual bleeding or bruising  unusually weak or tired  yellowing of the eyes or skin Side effects that usually do not require medical attention (report to your doctor or health care professional if they continue or are bothersome):  diarrhea  loss of appetite  nausea, vomiting This list may not describe all possible side effects. Call your doctor for medical advice about side effects. You may report side effects to FDA at 1-800-FDA-1088. Where should I keep my medicine? Keep out of the reach of children. Store at room temperature, below 30 degrees C (86 degrees F). Protect from light. Keep container tightly closed. Throw away any unused medicine after the expiration date. Taking this medicine after the expiration date can make you seriously ill. NOTE: This sheet is a summary. It may not cover all possible information. If you have questions about this medicine, talk to your doctor, pharmacist, or health care provider.  2021 Elsevier/Gold Standard (2018-05-07 13:44:53)

## 2020-07-19 ENCOUNTER — Ambulatory Visit: Payer: Self-pay | Admitting: Family Medicine

## 2020-07-19 DIAGNOSIS — K7460 Unspecified cirrhosis of liver: Secondary | ICD-10-CM

## 2020-07-19 HISTORY — DX: Unspecified cirrhosis of liver: K74.60

## 2020-07-20 ENCOUNTER — Ambulatory Visit: Payer: Medicare PPO

## 2020-07-20 NOTE — Progress Notes (Signed)
   Covid-19 Vaccination Clinic  Name:  Cheryl Cooper    MRN: [id number] DOB: November 09, 1940  07/20/2020  Cheryl Cooper was observed post Covid-19 immunization for 15 minutes without incident. She was provided with Vaccine Information Sheet and instruction to access the V-Safe system.   Cheryl Cooper was instructed to call 911 with any severe reactions post vaccine: [name] Difficulty breathing  . Swelling of face and throat  . A fast heartbeat  . A bad rash all over body  . Dizziness and weakness

## 2020-07-24 ENCOUNTER — Other Ambulatory Visit (HOSPITAL_BASED_OUTPATIENT_CLINIC_OR_DEPARTMENT_OTHER): Payer: Self-pay

## 2020-07-24 MED ORDER — PFIZER-BIONT COVID-19 VAC-TRIS 30 MCG/0.3ML IM SUSP
INTRAMUSCULAR | 0 refills | Status: DC
  Filled 2020-07-24: qty 0.3, 1d supply, fill #0

## 2020-07-25 ENCOUNTER — Other Ambulatory Visit (INDEPENDENT_AMBULATORY_CARE_PROVIDER_SITE_OTHER): Payer: Medicare PPO

## 2020-07-25 ENCOUNTER — Other Ambulatory Visit: Payer: Self-pay

## 2020-07-25 DIAGNOSIS — E785 Hyperlipidemia, unspecified: Secondary | ICD-10-CM | POA: Diagnosis not present

## 2020-07-25 DIAGNOSIS — R7401 Elevation of levels of liver transaminase levels: Secondary | ICD-10-CM | POA: Diagnosis not present

## 2020-07-25 LAB — HEPATIC FUNCTION PANEL
ALT: 27 U/L (ref 0–35)
AST: 28 U/L (ref 0–37)
Albumin: 4.2 g/dL (ref 3.5–5.2)
Alkaline Phosphatase: 40 U/L (ref 39–117)
Bilirubin, Direct: 0.1 mg/dL (ref 0.0–0.3)
Total Bilirubin: 0.4 mg/dL (ref 0.2–1.2)
Total Protein: 7.3 g/dL (ref 6.0–8.3)

## 2020-07-25 NOTE — Progress Notes (Signed)
Per orders of Dr. Ethelene Hal pt is here for repeat labs, pt tolerated draw well.

## 2020-07-26 ENCOUNTER — Other Ambulatory Visit: Payer: Self-pay | Admitting: Gastroenterology

## 2020-07-26 DIAGNOSIS — R140 Abdominal distension (gaseous): Secondary | ICD-10-CM

## 2020-07-26 DIAGNOSIS — K5792 Diverticulitis of intestine, part unspecified, without perforation or abscess without bleeding: Secondary | ICD-10-CM

## 2020-07-28 ENCOUNTER — Other Ambulatory Visit (INDEPENDENT_AMBULATORY_CARE_PROVIDER_SITE_OTHER): Payer: Medicare PPO

## 2020-07-28 ENCOUNTER — Other Ambulatory Visit: Payer: Self-pay

## 2020-07-28 DIAGNOSIS — E785 Hyperlipidemia, unspecified: Secondary | ICD-10-CM

## 2020-07-28 LAB — LIPID PANEL
Cholesterol: 150 mg/dL (ref 0–200)
HDL: 47.5 mg/dL (ref >39.00)
LDL Cholesterol: 63 mg/dL (ref 0–99)
NonHDL: 102.09
Total CHOL/HDL Ratio: 3
Triglycerides: 193 mg/dL — ABNORMAL HIGH (ref 0.0–149.0)
VLDL: 38.6 mg/dL (ref 0.0–40.0)

## 2020-08-09 ENCOUNTER — Other Ambulatory Visit: Payer: Self-pay | Admitting: Family Medicine

## 2020-08-09 DIAGNOSIS — I10 Essential (primary) hypertension: Secondary | ICD-10-CM

## 2020-08-10 ENCOUNTER — Ambulatory Visit
Admission: RE | Admit: 2020-08-10 | Discharge: 2020-08-10 | Disposition: A | Discharge status: Home / Self Care | Payer: Medicare PPO | Source: Ambulatory Visit | Attending: Gastroenterology | Admitting: Gastroenterology

## 2020-08-10 ENCOUNTER — Other Ambulatory Visit: Payer: Self-pay

## 2020-08-10 DIAGNOSIS — R140 Abdominal distension (gaseous): Secondary | ICD-10-CM

## 2020-08-10 DIAGNOSIS — K5792 Diverticulitis of intestine, part unspecified, without perforation or abscess without bleeding: Secondary | ICD-10-CM

## 2020-08-10 MED ORDER — IOPAMIDOL (ISOVUE-300) INJECTION 61%
100.0000 mL | Freq: Once | INTRAVENOUS | Status: AC | PRN
  Administered 2020-08-10: 100 mL via INTRAVENOUS

## 2020-08-22 ENCOUNTER — Encounter: Payer: Self-pay | Admitting: Nurse Practitioner

## 2020-08-22 ENCOUNTER — Ambulatory Visit (INDEPENDENT_AMBULATORY_CARE_PROVIDER_SITE_OTHER): Payer: Medicare PPO | Admitting: Nurse Practitioner

## 2020-08-22 ENCOUNTER — Other Ambulatory Visit: Payer: Self-pay

## 2020-08-22 VITALS — BP 134/80 | Ht 63.5 in | Wt 144.0 lb

## 2020-08-22 DIAGNOSIS — Z01419 Encounter for gynecological examination (general) (routine) without abnormal findings: Secondary | ICD-10-CM

## 2020-08-22 DIAGNOSIS — Z780 Asymptomatic menopausal state: Secondary | ICD-10-CM

## 2020-08-22 DIAGNOSIS — M8580 Other specified disorders of bone density and structure, unspecified site: Secondary | ICD-10-CM

## 2020-08-22 NOTE — Progress Notes (Signed)
   Cheryl Cooper 16-Sep-1940 [id number]   History:  80 y.o. [id number] presents for breast and pelvic exam. No GYN complaints. Postmenopausal - no HRT. 1986 TAH for DUB. Normal pap and mammogram history. T2DM, hypothyroidism, osteopenia managed by endocrinology. On Fosamax for osteopenia/multiple traumatic fractures (right hip, left shoulder, left forearm/elbow).  Gynecologic History No LMP recorded. Patient has had a hysterectomy.   Contraception: status post hysterectomy  Health Maintenance Last Pap: No longer screening per guidelines Last mammogram: 08/12/2019. Results were: Normal Last colonoscopy: 2018 Last Dexa: 2021  Past medical history, past surgical history, family history and social history were all reviewed and documented in the EPIC chart. Married.   ROS:  A ROS was performed and pertinent positives and negatives are included.  Exam:  Vitals:   08/22/20 1504  BP: 134/80  Weight: 144 lb (65.3 kg)  Height: 5' 3.5" (1.613 m)   Body mass index is 25.11 kg/m.  General appearance:  Normal Thyroid:  Symmetrical, normal in size, without palpable masses or nodularity. Respiratory  Auscultation:  Clear without wheezing or rhonchi Cardiovascular  Auscultation:  Regular rate, without rubs, murmurs or gallops  Edema/varicosities:  Not grossly evident Abdominal  Soft,nontender, without masses, guarding or rebound.  Liver/spleen:  No organomegaly noted  Hernia:  None appreciated  Skin  Inspection:  Grossly normal Breasts: Examined lying and sitting.   Right: Without masses, retractions, nipple discharge or axillary adenopathy.   Left: Without masses, retractions, nipple discharge or axillary adenopathy. Genitourinary   Inguinal/mons:  Normal without inguinal adenopathy  External genitalia:  Normal appearing vulva with no masses, tenderness, or lesions  BUS/Urethra/Skene's glands:  Normal  Vagina:  Normal appearing with normal color and discharge, no lesions  Cervix:   Absent  Uterus:  Absent  Anus and perineum: Normal  Digital rectal exam: Normal sphincter tone without palpated masses or tenderness  Assessment/Plan:  80 y.o. [id number] for breast and pelvic exam.   Well female exam with routine gynecological exam - Education provided on SBEs, importance of preventative screenings, current guidelines, high calcium diet, regular exercise, and multivitamin daily. Labs done elsewhere.   Postmenopausal - no HRT. S/P TAH for DUB in 1986.  Osteopenia, unspecified location - On Fosamax for osteopenia/multiple traumatic fractures (right hip, left shoulder, left forearm/elbow), managed by PCP.   Screening for cervical cancer - Normal Pap history.  No longer screening per guidelines.   Screening for breast cancer - Normal mammogram history.  Continue annual screenings.  Normal breast exam today.  Screening for colon cancer - 2018 colonoscopy. Will repeat at GI's recommended interval.   Return in 2 years for breast and pelvic exam.    Tamela Gammon DNP, 3:18 PM 08/22/2020

## 2020-09-18 ENCOUNTER — Telehealth: Payer: Self-pay | Admitting: Cardiovascular Disease

## 2020-09-18 NOTE — Telephone Encounter (Signed)
Pt have been experiencing weakness for several weeks, No other symptoms are associated with the weakness. Pt states she have a valve problem that was found in February  of 2022 and she would like to know if this is associated with the weakness

## 2020-09-18 NOTE — Telephone Encounter (Signed)
Spoke with pt, she reports feeling tired all the time. She reports having to take a nap during the day because she gets so tired. She has aortic stenosis that is moderate on most recent echo. She denies any chest pain, SOB or fainting just feels tired. She does not check her bp. Encouraged the patient to check her bp when she gets up in the morning and then about 2 hours after taking her medications. She will track her bp and call at the end of the week to report how it is running.

## 2020-09-21 ENCOUNTER — Ambulatory Visit (INDEPENDENT_AMBULATORY_CARE_PROVIDER_SITE_OTHER): Payer: Medicare PPO | Admitting: Family Medicine

## 2020-09-21 ENCOUNTER — Other Ambulatory Visit: Payer: Self-pay

## 2020-09-21 VITALS — BP 118/64 | HR 60 | Temp 98.1°F | Ht 63.5 in | Wt 140.0 lb

## 2020-09-21 DIAGNOSIS — R0982 Postnasal drip: Secondary | ICD-10-CM

## 2020-09-21 DIAGNOSIS — J309 Allergic rhinitis, unspecified: Secondary | ICD-10-CM | POA: Diagnosis not present

## 2020-09-21 NOTE — Progress Notes (Signed)
[facility] [address] [place] [address] Dept: [phone] Dept Fax: [phone]  Office Visit  Subjective:    Patient ID: Cheryl Cooper, female    DOB: 24-Apr-1940, 80 y.o..   MRN: [id number]  Chief Complaint  Patient presents with   Acute Visit    C/o having a scratchy throat and HA's on/off x 1 week. She has been taking Aleve and Alevert with little relief.   Negative covid test on 09/18/20.      History of Present Illness:  Patient is in today with a 1-week history of sore throat and headaches. She feels like she may have some drainage in the back of the throat. She denies fever, rhinorrhea, swollen glands, or cough. She has been using Aleve and yesterday took an Administrator, [employer]. Her husband has a nettie pot, but she has not tried using that.  Past Medical History: Patient Active Problem List   Diagnosis Date Noted   Tick bite of abdomen 07/11/2020   Hyperlipidemia 06/22/2020   Type 2 diabetes, diet controlled ([place]) 06/22/2020   Chronic back pain 06/22/2020   Transaminitis 06/22/2020   Diverticulitis 03/26/2020   Other fatigue 03/05/2019   H/O total shoulder replacement, left 10/23/2018   PUD (peptic ulcer disease) 03/21/2018   Nausea and vomiting 01/11/2018   Synovial cyst of lumbar facet joint 12/23/2017   Osteoarthritis of right hip 04/24/2017   Primary osteoarthritis of right hip 04/24/2017   Complete rotator cuff tear 05/09/2016   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   Dizziness and giddiness 01/04/2015   Post concussion syndrome 01/04/2015   Rotator cuff tear, right 11/21/2011   Past Surgical History:  Procedure Laterality Date   ABDOMINAL HYSTERECTOMY  1986   BACK SURGERY  2010   lower   BIOPSY  01/12/2018   Procedure: BIOPSY;  Surgeon: Irving Copas., MD;  Location: [facility];  Service: Gastroenterology;;   CHOLECYSTECTOMY     ESOPHAGOGASTRODUODENOSCOPY (EGD) WITH PROPOFOL N/A 01/12/2018    Procedure: ESOPHAGOGASTRODUODENOSCOPY (EGD) WITH PROPOFOL;  Surgeon: Irving Copas., MD;  Location: [facility];  Service: Gastroenterology;  Laterality: N/A;   left elobow surgery  2003   left rotator cuff  2001   LUMBAR LAMINECTOMY/DECOMPRESSION MICRODISCECTOMY Left 12/23/2017   Procedure: Laminectomy for facet/synovial cyst - left - Lumbar three-Lumbar four;  Surgeon: Earnie Larsson, MD;  Location: [facility];  Service: Neurosurgery;  Laterality: Left;   r foot surgery  1995   REVERSE SHOULDER ARTHROPLASTY Left 10/23/2018   Procedure: REVERSE TOTAL SHOULDER ARTHROPLASTY;  Surgeon: Netta Cedars, MD;  Location: [facility];  Service: Orthopedics;  Laterality: Left;  interscalene block   right hand surgery  2001   right index finger surgery  2009   SHOULDER OPEN ROTATOR CUFF REPAIR  11/21/2011   Procedure: ROTATOR CUFF REPAIR SHOULDER OPEN;  Surgeon: Johnn Hai, MD;  Location: [facility];  Service: Orthopedics;  Laterality: Right;  with subacromial decompression   SHOULDER OPEN ROTATOR CUFF REPAIR Right 05/09/2016   Procedure: Right shoulder mini open revision rotator cuff repair, subacromial decompression;  Surgeon: Susa Day, MD;  Location: [facility];  Service: Orthopedics;  Laterality: Right;   TOTAL HIP ARTHROPLASTY Right 04/24/2017   Procedure: RIGHT TOTAL HIP ARTHROPLASTY ANTERIOR APPROACH;  Surgeon: Rod Can, MD;  Location: [facility];  Service: Orthopedics;  Laterality: Right;  Needs RNFA   Family History  Problem Relation Age of Onset   Heart attack Mother    Stroke Father  Heart attack Father    Stroke Sister    Stroke Brother    Diabetes Brother    Dementia Brother    Heart disease Brother    Stroke Sister        TIA   Outpatient Medications Prior to Visit  Medication Sig Dispense Refill   alendronate (FOSAMAX) 70 MG tablet Take 1 tablet (70 mg total) by mouth every Sunday. Take with a full glass of water on an empty stomach. 12 tablet 3   amLODipine (NORVASC) 5 MG tablet  TAKE 1 AND 1/2 TABLET BY MOUTH DAILY 135 tablet 1   Calcium Carbonate-Vitamin D (CALCIUM-D PO) Take 2 tablets by mouth daily. CHEWABLES     Cholecalciferol 25 MCG (1000 UT) tablet Take by mouth.     cyanocobalamin 100 MCG tablet Take by mouth.     escitalopram (LEXAPRO) 20 MG tablet Take 1 tablet (20 mg total) by mouth daily. 90 tablet 1   esomeprazole (NEXIUM) 40 MG capsule Take 1 capsule (40 mg total) by mouth 2 (two) times daily before a meal. 60 capsule 0   ezetimibe (ZETIA) 10 MG tablet Take 1 tablet (10 mg total) by mouth daily. 90 tablet 2   levothyroxine (SYNTHROID) 75 MCG tablet Take 1 tablet (75 mcg total) by mouth daily with breakfast. 90 tablet 2   Milk Thistle Extract 175 MG TABS Take by mouth.     Multiple Vitamins-Minerals (WOMENS MULTI GUMMIES PO) Take 2 tablets by mouth daily.     oxybutynin (DITROPAN XL) 15 MG 24 hr tablet Pt states she takes 1/2 pill at bedtime.     rosuvastatin (CRESTOR) 10 MG tablet Take 1 tablet (10 mg total) by mouth daily. 90 tablet 0   traZODone (DESYREL) 50 MG tablet Take 0.5 tablets (25 mg total) by mouth at bedtime. 45 tablet 1   VITAMIN D PO Take by mouth.     COVID-19 mRNA Vac-TriS, Pfizer, (PFIZER-BIONT COVID-19 VAC-TRIS) SUSP injection Inject into the muscle. (Patient not taking: Reported on 09/21/2020) 0.3 mL 0   No facility-administered medications prior to visit.   Allergies  Allergen Reactions   Other Other (See Comments) and Rash    Tomato sauce, garlic, onion - severe acid reflux    Flexeril [Cyclobenzaprine] Other (See Comments)    Per spouse "she felt like a zombie"   Atorvastatin Other (See Comments)    Unbalanced   Chlordiazepoxide-Clidinium Other (See Comments)    Dizziness (intolerance)   Codeine Nausea Only and Rash   Objective:   Today's Vitals   09/21/20 1141  BP: 118/64  Pulse: 60  Temp: 98.1 F (36.7 C)  TempSrc: Oral  SpO2: 94%  Weight: 140 lb (63.5 kg)  Height: 5' 3.5" (1.613 m)   Body mass index is 24.41  kg/m.   General: Well developed, well nourished. No acute distress. HEENT: Normocephalic, non-traumatic. Eyes clear. Nose  with mild ot moderate   congestion, but no rhinorrhea. Mucous membranes moist. Oropharynx clear. No   tonsillar exudates. Good dentition. Neck: Supple. No lymphadenopathy. Psych: Alert and oriented. Normal mood and affect.  Health Maintenance Due  Topic Date Due   FOOT EXAM  Never done   OPHTHALMOLOGY EXAM  Never done   HEMOGLOBIN A1C  07/21/2020   URINE MICROALBUMIN  07/25/2020   INFLUENZA VACCINE  09/18/2020     Assessment & Plan:   1. Allergic rhinitis, unspecified seasonality, unspecified trigger 2. Post-nasal drip I suspect her symptoms are allergic. I see no evidence of  a bacterial infection. I recommend she take a daily nonsedating antihistamine for 1 month. We discussed her using the nettie pot daily. She does not like tea, so we discussed a tsp. of honey 1-2 times a day. Follow-up as needed.  Haydee Salter, MD

## 2020-09-21 NOTE — Patient Instructions (Signed)
Use a daily non-sediating antihisamine for 1 month, such as: Allegra (fexofenadine) Claritin (loratadine) Zyrtec (cetirizine)

## 2020-09-25 ENCOUNTER — Other Ambulatory Visit: Payer: Self-pay | Admitting: Family Medicine

## 2020-09-25 DIAGNOSIS — E7800 Pure hypercholesterolemia, unspecified: Secondary | ICD-10-CM

## 2020-09-27 ENCOUNTER — Telehealth: Payer: Self-pay

## 2020-09-27 NOTE — Telephone Encounter (Signed)
Was seen last week for sore throat. She started taking Claritin.  She now has a scratchy, sore throat and a terrible headache. I scheduled her a virtual visit on 8/12 at 4p.  She wanted to know is she could come in to the office instead. Please advise,  Thanks

## 2020-09-27 NOTE — Telephone Encounter (Signed)
Should her appointment be a VV or can she come into the office?   Please advise.   Thanks. Dm/cma

## 2020-09-28 NOTE — Telephone Encounter (Signed)
Can you change this to an office visit instead of VV.  Thanks.  Dm/cma

## 2020-09-29 ENCOUNTER — Other Ambulatory Visit: Payer: Self-pay

## 2020-09-29 ENCOUNTER — Encounter: Payer: Self-pay | Admitting: Family Medicine

## 2020-09-29 ENCOUNTER — Ambulatory Visit (INDEPENDENT_AMBULATORY_CARE_PROVIDER_SITE_OTHER): Payer: Medicare PPO | Admitting: Family Medicine

## 2020-09-29 VITALS — BP 118/62 | HR 79 | Temp 98.6°F | Ht 63.5 in | Wt 140.0 lb

## 2020-09-29 DIAGNOSIS — J0100 Acute maxillary sinusitis, unspecified: Secondary | ICD-10-CM | POA: Diagnosis not present

## 2020-09-29 MED ORDER — AMOXICILLIN-POT CLAVULANATE 875-125 MG PO TABS: 1.0000 | ORAL_TABLET | Freq: Two times a day (BID) | ORAL | 0 refills | Status: DC

## 2020-09-29 NOTE — Progress Notes (Signed)
[facility] [address] [place] [address] Dept: [phone] Dept Fax: [phone]  Office Visit  Subjective:    Patient ID: Cheryl Cooper, female    DOB: [birth year], 80 y.o..   MRN: [id number]  Chief Complaint  Patient presents with   Follow-up    1 wk f/u. Pt c/o sore throat, slight headache, congestion, and trace of blood in nasal discharge, with fatigue.    History of Present Illness:  Patient is in today for reassessment of her sinus issues. [name] was seen on 8/4 with a 1-week history of sore throat, headaches, post-nasal drip, but no fever, rhinorrhea, swollen glands, or cough. She had evidence of nasal congestion. I had her start taking an antihistamine for presumed allergic rhinitis. Since that time, she has had progression of symptoms, with increased congestion, some sinus pressure and some blowing some blood from her nose. She has also noted nonrestorative sleep and daytime fatigue.  Past Medical History: Patient Active Problem List   Diagnosis Date Noted   Tick bite of abdomen 07/11/2020   Hyperlipidemia 06/22/2020   Type 2 diabetes, diet controlled ([place]) 06/22/2020   Chronic back pain 06/22/2020   Transaminitis 06/22/2020   Diverticulitis 03/26/2020   Other fatigue 03/05/2019   H/O total shoulder replacement, left 10/23/2018   PUD (peptic ulcer disease) 03/21/2018   Nausea and vomiting 01/11/2018   Synovial cyst of lumbar facet joint 12/23/2017   Osteoarthritis of right hip 04/24/2017   Primary osteoarthritis of right hip 04/24/2017   Complete rotator cuff tear 05/09/2016   Brainstem stroke ([place]) 01/04/2015   Dizziness and giddiness 01/04/2015   Post concussion syndrome 01/04/2015   Rotator cuff tear, right 11/21/2011   Past Surgical History:  Procedure Laterality Date   ABDOMINAL HYSTERECTOMY  1986   BACK SURGERY  2010   lower   BIOPSY  01/12/2018   Procedure: BIOPSY;  Surgeon: Irving Copas., MD;  Location: [facility];  Service: Gastroenterology;;   CHOLECYSTECTOMY     ESOPHAGOGASTRODUODENOSCOPY (EGD) WITH PROPOFOL N/A 01/12/2018   Procedure: ESOPHAGOGASTRODUODENOSCOPY (EGD) WITH PROPOFOL;  Surgeon: Irving Copas., MD;  Location: [facility];  Service: Gastroenterology;  Laterality: N/A;   left elobow surgery  2003   left rotator cuff  2001   LUMBAR LAMINECTOMY/DECOMPRESSION MICRODISCECTOMY Left 12/23/2017   Procedure: Laminectomy for facet/synovial cyst - left - Lumbar three-Lumbar four;  Surgeon: Earnie Larsson, MD;  Location: [facility];  Service: Neurosurgery;  Laterality: Left;   r foot surgery  1995   REVERSE SHOULDER ARTHROPLASTY Left 10/23/2018   Procedure: REVERSE TOTAL SHOULDER ARTHROPLASTY;  Surgeon: Netta Cedars, MD;  Location: [facility];  Service: Orthopedics;  Laterality: Left;  interscalene block   right hand surgery  2001   right index finger surgery  2009   SHOULDER OPEN ROTATOR CUFF REPAIR  11/21/2011   Procedure: ROTATOR CUFF REPAIR SHOULDER OPEN;  Surgeon: Johnn Hai, MD;  Location: [facility];  Service: Orthopedics;  Laterality: Right;  with subacromial decompression   SHOULDER OPEN ROTATOR CUFF REPAIR Right 05/09/2016   Procedure: Right shoulder mini open revision rotator cuff repair, subacromial decompression;  Surgeon: Susa Day, MD;  Location: [facility];  Service: Orthopedics;  Laterality: Right;   TOTAL HIP ARTHROPLASTY Right 04/24/2017   Procedure: RIGHT TOTAL HIP ARTHROPLASTY ANTERIOR APPROACH;  Surgeon: Rod Can, MD;  Location: [facility];  Service: Orthopedics;  Laterality: Right;  Needs RNFA   Family History  Problem Relation Age of Onset  Heart attack Mother    Stroke Father    Heart attack Father    Stroke Sister    Stroke Brother    Diabetes Brother    Dementia Brother    Heart disease Brother    Stroke Sister        TIA   Outpatient Medications Prior to Visit  Medication Sig Dispense Refill   alendronate (FOSAMAX) 70 MG  tablet Take 1 tablet (70 mg total) by mouth every Sunday. Take with a full glass of water on an empty stomach. 12 tablet 3   amLODipine (NORVASC) 5 MG tablet TAKE 1 AND 1/2 TABLET BY MOUTH DAILY 135 tablet 1   Calcium Carbonate-Vitamin D (CALCIUM-D PO) Take 2 tablets by mouth daily. CHEWABLES     Cholecalciferol 25 MCG (1000 UT) tablet Take by mouth.     cyanocobalamin 100 MCG tablet Take by mouth.     escitalopram (LEXAPRO) 20 MG tablet Take 1 tablet (20 mg total) by mouth daily. 90 tablet 1   esomeprazole (NEXIUM) 40 MG capsule Take 1 capsule (40 mg total) by mouth 2 (two) times daily before a meal. 60 capsule 0   ezetimibe (ZETIA) 10 MG tablet Take 1 tablet (10 mg total) by mouth daily. 90 tablet 2   levothyroxine (SYNTHROID) 75 MCG tablet Take 1 tablet (75 mcg total) by mouth daily with breakfast. 90 tablet 2   Milk Thistle Extract 175 MG TABS Take by mouth.     Multiple Vitamins-Minerals (WOMENS MULTI GUMMIES PO) Take 2 tablets by mouth daily.     oxybutynin (DITROPAN XL) 15 MG 24 hr tablet Pt states she takes 1/2 pill at bedtime.     rosuvastatin (CRESTOR) 10 MG tablet TAKE ONE TABLET BY MOUTH DAILY 90 tablet 0   traZODone (DESYREL) 50 MG tablet Take 0.5 tablets (25 mg total) by mouth at bedtime. 45 tablet 1   VITAMIN D PO Take by mouth.     No facility-administered medications prior to visit.   Allergies  Allergen Reactions   Other Other (See Comments) and Rash    Tomato sauce, garlic, onion - severe acid reflux    Flexeril [Cyclobenzaprine] Other (See Comments)    Per spouse "she felt like a zombie"   Atorvastatin Other (See Comments)    Unbalanced   Chlordiazepoxide-Clidinium Other (See Comments)    Dizziness (intolerance)   Codeine Nausea Only and Rash    Objective:   Today's Vitals   09/29/20 1601  BP: 118/62  Pulse: 79  Temp: 98.6 F (37 C)  TempSrc: Oral  SpO2: 95%  Weight: 140 lb (63.5 kg)  Height: 5' 3.5" (1.613 m)   Body mass index is 24.41 kg/m.    General: Well developed, well nourished. No acute distress. HEENT: Normocephalic, non-traumatic. Mild tearing, but eyes appear clear. External ears   normal. EAC and TMs normal bilaterally. Nasal mucosa is reddened with moderate congestion   and green rhinorrhea. There is some areas of bleeding at the nasal atrium on the septum   bilaterally. Mild pain on percussion over left maxillary sinus. Mucous membranes moist.   Oropharynx clear. Good dentition. Neck: Supple. No lymphadenopathy. No thyromegaly. Lungs: Clear to auscultation bilaterally. No wheezing, rales or rhonchi. CV: RRR without murmurs or rubs. Pulses 2+ bilaterally. Psych: Alert and oriented. Normal mood and affect.  Health Maintenance Due  Topic Date Due   FOOT EXAM  Never done   OPHTHALMOLOGY EXAM  Never done   HEMOGLOBIN A1C  07/21/2020  URINE MICROALBUMIN  07/25/2020   INFLUENZA VACCINE  09/18/2020   Assessment & Plan:   1. Acute non-recurrent maxillary sinusitis Ms. Maclin has had worsening symptoms over the past 2 weeks, consistent with a probable sinus infection. I recommend she use Vaseline int he nasal antrum to help the raw areas heal. I will start her on Augmentin. I will have her follow-up in 10 days if symptoms have not improved.   - amoxicillin-clavulanate (AUGMENTIN) 875-125 MG tablet; Take 1 tablet by mouth 2 (two) times daily.  Dispense: 20 tablet; Refill: 0  Haydee Salter, MD

## 2020-10-02 ENCOUNTER — Other Ambulatory Visit: Payer: Self-pay | Admitting: Obstetrics and Gynecology

## 2020-10-02 DIAGNOSIS — Z1231 Encounter for screening mammogram for malignant neoplasm of breast: Secondary | ICD-10-CM

## 2020-10-19 ENCOUNTER — Other Ambulatory Visit: Payer: Self-pay | Admitting: Gastroenterology

## 2020-10-19 DIAGNOSIS — K7469 Other cirrhosis of liver: Secondary | ICD-10-CM

## 2020-10-26 ENCOUNTER — Ambulatory Visit
Admission: RE | Admit: 2020-10-26 | Discharge: 2020-10-26 | Disposition: A | Discharge status: Home / Self Care | Payer: Medicare PPO | Source: Ambulatory Visit | Attending: Gastroenterology | Admitting: Gastroenterology

## 2020-10-26 ENCOUNTER — Other Ambulatory Visit: Payer: Self-pay | Admitting: Family Medicine

## 2020-10-26 DIAGNOSIS — F32A Depression, unspecified: Secondary | ICD-10-CM

## 2020-10-26 DIAGNOSIS — K7469 Other cirrhosis of liver: Secondary | ICD-10-CM

## 2020-11-01 ENCOUNTER — Other Ambulatory Visit: Payer: Self-pay

## 2020-11-01 ENCOUNTER — Ambulatory Visit (INDEPENDENT_AMBULATORY_CARE_PROVIDER_SITE_OTHER): Payer: Medicare PPO

## 2020-11-01 ENCOUNTER — Other Ambulatory Visit: Payer: Self-pay | Admitting: Family Medicine

## 2020-11-01 DIAGNOSIS — Z23 Encounter for immunization: Secondary | ICD-10-CM | POA: Diagnosis not present

## 2020-11-01 DIAGNOSIS — F32A Depression, unspecified: Secondary | ICD-10-CM

## 2020-11-01 NOTE — Telephone Encounter (Signed)
Pt requesting refill on Escitalopram '20mg'$ 

## 2020-11-01 NOTE — Telephone Encounter (Signed)
Refill request for pending Rx last OV 09/29/20 last RF December 2021. Please advise

## 2020-11-01 NOTE — Progress Notes (Signed)
Per orders of Dr. Ethelene Hal Pt is here for influenza vaccine, pt received injection in right deltoid, given by Aryia Delira W. Pt monitored for adverse reactions. Pt tolerated injection well.

## 2020-11-02 MED ORDER — ESCITALOPRAM OXALATE 20 MG PO TABS: 20.0000 mg | ORAL_TABLET | Freq: Every day | ORAL | 0 refills | Status: DC

## 2020-11-14 ENCOUNTER — Other Ambulatory Visit: Payer: Self-pay | Admitting: Gastroenterology

## 2020-11-14 DIAGNOSIS — K5900 Constipation, unspecified: Secondary | ICD-10-CM

## 2020-11-14 DIAGNOSIS — R140 Abdominal distension (gaseous): Secondary | ICD-10-CM

## 2020-11-17 ENCOUNTER — Ambulatory Visit
Admission: RE | Admit: 2020-11-17 | Discharge: 2020-11-17 | Disposition: A | Discharge status: Home / Self Care | Payer: Medicare PPO | Source: Ambulatory Visit | Attending: Gastroenterology | Admitting: Gastroenterology

## 2020-11-17 ENCOUNTER — Ambulatory Visit
Admission: RE | Admit: 2020-11-17 | Discharge: 2020-11-17 | Disposition: A | Discharge status: Home / Self Care | Payer: Medicare PPO | Source: Ambulatory Visit | Attending: Obstetrics and Gynecology | Admitting: Obstetrics and Gynecology

## 2020-11-17 ENCOUNTER — Other Ambulatory Visit: Payer: Self-pay

## 2020-11-17 ENCOUNTER — Ambulatory Visit: Payer: Medicare PPO

## 2020-11-17 DIAGNOSIS — R140 Abdominal distension (gaseous): Secondary | ICD-10-CM

## 2020-11-17 DIAGNOSIS — Z1231 Encounter for screening mammogram for malignant neoplasm of breast: Secondary | ICD-10-CM

## 2020-11-17 DIAGNOSIS — K5900 Constipation, unspecified: Secondary | ICD-10-CM

## 2020-11-20 ENCOUNTER — Other Ambulatory Visit: Payer: Medicare PPO

## 2020-11-28 ENCOUNTER — Ambulatory Visit: Payer: Medicare PPO | Attending: Internal Medicine

## 2020-11-28 DIAGNOSIS — Z23 Encounter for immunization: Secondary | ICD-10-CM

## 2020-11-28 NOTE — Progress Notes (Signed)
   Covid-19 Vaccination Clinic  Name:  XOCHILT CONANT    MRN: [id number] DOB: November 22, 1940  11/28/2020  Ms. Aguado was observed post Covid-19 immunization for 15 minutes without incident. She was provided with Vaccine Information Sheet and instruction to access the V-Safe system.   Ms. Munford was instructed to call 911 with any severe reactions post vaccine: Difficulty breathing  Swelling of face and throat  A fast heartbeat  A bad rash all over body  Dizziness and weakness

## 2020-12-08 ENCOUNTER — Other Ambulatory Visit (HOSPITAL_BASED_OUTPATIENT_CLINIC_OR_DEPARTMENT_OTHER): Payer: Self-pay

## 2020-12-08 MED ORDER — COVID-19MRNA BIVAL VACC PFIZER 30 MCG/0.3ML IM SUSP
INTRAMUSCULAR | 0 refills | Status: DC
  Filled 2020-12-08: qty 0.3, 1d supply, fill #0

## 2020-12-12 ENCOUNTER — Telehealth: Payer: Self-pay | Admitting: Family Medicine

## 2020-12-12 NOTE — Telephone Encounter (Signed)
Pt came into the office to inform us she has cirrhosis of the liver... stated this information needs to be added to her chart.

## 2020-12-12 NOTE — Telephone Encounter (Signed)
Pt came into the office to inform us she has cirrhosis of the liver

## 2020-12-13 NOTE — Telephone Encounter (Signed)
Called patient to inform that Dr. Ethelene Hal received her message viewed records and states that they can discuss diagnosis at patients next visit. Had to leave message stating above and asked patient to call to schedule follow up appointment.

## 2020-12-25 ENCOUNTER — Other Ambulatory Visit: Payer: Self-pay | Admitting: Family Medicine

## 2020-12-25 DIAGNOSIS — E7800 Pure hypercholesterolemia, unspecified: Secondary | ICD-10-CM

## 2021-01-09 ENCOUNTER — Telehealth: Payer: Self-pay

## 2021-01-09 NOTE — Telephone Encounter (Signed)
Letter has been sent to patient informing them that their sleep study has expired. Patient will need to call and schedule an office visit to re-evaluate the need for a sleep study.    

## 2021-01-18 DIAGNOSIS — Z86010 Personal history of colonic polyps: Secondary | ICD-10-CM | POA: Diagnosis not present

## 2021-01-18 DIAGNOSIS — R140 Abdominal distension (gaseous): Secondary | ICD-10-CM | POA: Diagnosis not present

## 2021-01-18 DIAGNOSIS — K219 Gastro-esophageal reflux disease without esophagitis: Secondary | ICD-10-CM | POA: Diagnosis not present

## 2021-01-18 DIAGNOSIS — K7460 Unspecified cirrhosis of liver: Secondary | ICD-10-CM | POA: Diagnosis not present

## 2021-01-30 DIAGNOSIS — R140 Abdominal distension (gaseous): Secondary | ICD-10-CM | POA: Diagnosis not present

## 2021-01-30 DIAGNOSIS — K317 Polyp of stomach and duodenum: Secondary | ICD-10-CM | POA: Diagnosis not present

## 2021-01-30 DIAGNOSIS — Z86010 Personal history of colonic polyps: Secondary | ICD-10-CM | POA: Diagnosis not present

## 2021-01-30 DIAGNOSIS — K219 Gastro-esophageal reflux disease without esophagitis: Secondary | ICD-10-CM | POA: Diagnosis not present

## 2021-01-30 DIAGNOSIS — K5730 Diverticulosis of large intestine without perforation or abscess without bleeding: Secondary | ICD-10-CM | POA: Diagnosis not present

## 2021-01-31 ENCOUNTER — Telehealth: Payer: Self-pay

## 2021-01-31 ENCOUNTER — Ambulatory Visit: Payer: Medicare PPO

## 2021-01-31 NOTE — Telephone Encounter (Signed)
Tried calling patient twice,  spam blocker will not allow pressing option 1 to place the call through.  No Option to leave a message . Patient may reschedule next available appointment.  L.Landon Truax,LPN

## 2021-02-01 ENCOUNTER — Other Ambulatory Visit: Payer: Self-pay | Admitting: Family Medicine

## 2021-02-01 DIAGNOSIS — E039 Hypothyroidism, unspecified: Secondary | ICD-10-CM

## 2021-02-03 ENCOUNTER — Other Ambulatory Visit: Payer: Self-pay | Admitting: Family Medicine

## 2021-02-03 DIAGNOSIS — F32A Depression, unspecified: Secondary | ICD-10-CM

## 2021-02-07 DIAGNOSIS — K317 Polyp of stomach and duodenum: Secondary | ICD-10-CM | POA: Diagnosis not present

## 2021-02-08 DIAGNOSIS — M47816 Spondylosis without myelopathy or radiculopathy, lumbar region: Secondary | ICD-10-CM | POA: Diagnosis not present

## 2021-02-08 DIAGNOSIS — M5137 Other intervertebral disc degeneration, lumbosacral region: Secondary | ICD-10-CM | POA: Diagnosis not present

## 2021-02-08 DIAGNOSIS — M533 Sacrococcygeal disorders, not elsewhere classified: Secondary | ICD-10-CM | POA: Diagnosis not present

## 2021-02-15 DIAGNOSIS — M533 Sacrococcygeal disorders, not elsewhere classified: Secondary | ICD-10-CM | POA: Diagnosis not present

## 2021-02-18 HISTORY — PX: CARPAL TUNNEL RELEASE: SHX101

## 2021-02-27 DIAGNOSIS — M79672 Pain in left foot: Secondary | ICD-10-CM | POA: Diagnosis not present

## 2021-02-27 DIAGNOSIS — M19072 Primary osteoarthritis, left ankle and foot: Secondary | ICD-10-CM | POA: Diagnosis not present

## 2021-03-05 ENCOUNTER — Other Ambulatory Visit: Payer: Self-pay | Admitting: Family Medicine

## 2021-03-05 DIAGNOSIS — F32A Depression, unspecified: Secondary | ICD-10-CM

## 2021-03-06 DIAGNOSIS — M542 Cervicalgia: Secondary | ICD-10-CM | POA: Diagnosis not present

## 2021-03-06 DIAGNOSIS — M19111 Post-traumatic osteoarthritis, right shoulder: Secondary | ICD-10-CM | POA: Diagnosis not present

## 2021-03-06 DIAGNOSIS — M47816 Spondylosis without myelopathy or radiculopathy, lumbar region: Secondary | ICD-10-CM | POA: Diagnosis not present

## 2021-03-06 DIAGNOSIS — M25512 Pain in left shoulder: Secondary | ICD-10-CM | POA: Diagnosis not present

## 2021-03-08 DIAGNOSIS — E785 Hyperlipidemia, unspecified: Secondary | ICD-10-CM | POA: Diagnosis not present

## 2021-03-08 DIAGNOSIS — M810 Age-related osteoporosis without current pathological fracture: Secondary | ICD-10-CM | POA: Diagnosis not present

## 2021-03-08 DIAGNOSIS — E039 Hypothyroidism, unspecified: Secondary | ICD-10-CM | POA: Diagnosis not present

## 2021-03-08 DIAGNOSIS — F419 Anxiety disorder, unspecified: Secondary | ICD-10-CM | POA: Diagnosis not present

## 2021-03-08 DIAGNOSIS — F325 Major depressive disorder, single episode, in full remission: Secondary | ICD-10-CM | POA: Diagnosis not present

## 2021-03-08 DIAGNOSIS — E119 Type 2 diabetes mellitus without complications: Secondary | ICD-10-CM | POA: Diagnosis not present

## 2021-03-08 DIAGNOSIS — G4700 Insomnia, unspecified: Secondary | ICD-10-CM | POA: Diagnosis not present

## 2021-03-08 DIAGNOSIS — I10 Essential (primary) hypertension: Secondary | ICD-10-CM | POA: Diagnosis not present

## 2021-03-08 DIAGNOSIS — K7460 Unspecified cirrhosis of liver: Secondary | ICD-10-CM | POA: Diagnosis not present

## 2021-03-09 ENCOUNTER — Other Ambulatory Visit: Payer: Self-pay

## 2021-03-09 ENCOUNTER — Telehealth: Payer: Self-pay | Admitting: Family Medicine

## 2021-03-09 DIAGNOSIS — I10 Essential (primary) hypertension: Secondary | ICD-10-CM

## 2021-03-09 DIAGNOSIS — F32A Depression, unspecified: Secondary | ICD-10-CM

## 2021-03-09 MED ORDER — AMLODIPINE BESYLATE 5 MG PO TABS: 7.5000 mg | ORAL_TABLET | Freq: Every day | ORAL | 1 refills | Status: DC

## 2021-03-09 MED ORDER — TRAZODONE HCL 50 MG PO TABS: 25.0000 mg | ORAL_TABLET | Freq: Every day | ORAL | 1 refills | Status: DC

## 2021-03-09 NOTE — Telephone Encounter (Signed)
Refills for requested Rx below. Last OV 09/29/2020 last refill 06/22/20. Please advise

## 2021-03-09 NOTE — Telephone Encounter (Signed)
What is the name of the medication? escitalopram (LEXAPRO) 20 MG tablet [[id number]], amLODipine (NORVASC) 5 MG tablet [[id number]]  and  traZODone (DESYREL) 50 MG tablet [[id number]] Have you contacted your pharmacy to request a refill? Pt is out of all of these scripts.  Which pharmacy would you like this sent to? [id number] [place], [place] - [address]  [address] [place], [place] [address]  Phone:  [phone]  Fax:  [phone]   Patient notified that their request is being sent to the clinical staff for review and that they should receive a call once it is complete. If they do not receive a call within 72 hours they can check with their pharmacy or our office.

## 2021-03-09 NOTE — Telephone Encounter (Signed)
Amlodipine sent in, last refill for Lexapro December 2022 with 90 day supply patient should have 60 tablets left. Request for Trazone sent to doctor for approval.

## 2021-03-15 DIAGNOSIS — M79641 Pain in right hand: Secondary | ICD-10-CM | POA: Diagnosis not present

## 2021-03-15 DIAGNOSIS — R2231 Localized swelling, mass and lump, right upper limb: Secondary | ICD-10-CM | POA: Diagnosis not present

## 2021-03-15 DIAGNOSIS — G5601 Carpal tunnel syndrome, right upper limb: Secondary | ICD-10-CM | POA: Diagnosis not present

## 2021-03-20 ENCOUNTER — Other Ambulatory Visit: Payer: Self-pay

## 2021-03-20 ENCOUNTER — Ambulatory Visit: Payer: Medicare PPO | Admitting: Family Medicine

## 2021-03-20 ENCOUNTER — Encounter: Payer: Self-pay | Admitting: Family Medicine

## 2021-03-20 VITALS — BP 122/64 | HR 63 | Temp 97.0°F | Ht 63.0 in | Wt 140.4 lb

## 2021-03-20 DIAGNOSIS — K5901 Slow transit constipation: Secondary | ICD-10-CM

## 2021-03-20 DIAGNOSIS — R5383 Other fatigue: Secondary | ICD-10-CM

## 2021-03-20 DIAGNOSIS — Z5181 Encounter for therapeutic drug level monitoring: Secondary | ICD-10-CM

## 2021-03-20 DIAGNOSIS — D649 Anemia, unspecified: Secondary | ICD-10-CM | POA: Insufficient documentation

## 2021-03-20 DIAGNOSIS — E7800 Pure hypercholesterolemia, unspecified: Secondary | ICD-10-CM | POA: Diagnosis not present

## 2021-03-20 LAB — IRON,TIBC AND FERRITIN PANEL
%SAT: 17 % (calc) (ref 16–45)
Ferritin: 6 ng/mL — ABNORMAL LOW (ref 16–288)
Iron: 70 ug/dL (ref 45–160)
TIBC: 414 mcg/dL (calc) (ref 250–450)

## 2021-03-20 MED ORDER — CYANOCOBALAMIN 1000 MCG/ML IJ SOLN
1000.0000 ug | Freq: Once | INTRAMUSCULAR | Status: AC
  Administered 2021-03-20: 1000 ug via INTRAMUSCULAR

## 2021-03-20 NOTE — Progress Notes (Signed)
Established Patient Office Visit  Subjective:  Patient ID: Cheryl Cooper, female    DOB: 04/10/1940  Age: 81 y.o. MRN: [id number]  CC:  Chief Complaint  Patient presents with   Fatigue    Fatigue x 1 month sleeping more than usual.     HPI UNIQUE SILLAS presents for evaluation of 1 month history of fatigue.  Patient has been sleeping well and also more than usual.  Mostly feels okay otherwise.  Dealing with chronic arthralgias in her right shoulder that needs replacing and painful carpal tunnel syndrome in her right hand.  Is taken Lexapro for depression and it seems to be helping.  Appetite has been okay but somewhat diminished.  Ongoing history of constipation.  She has been taking MiraLAX and Senokot as needed.  Recent history of transaminitis that spontaneously resolved.  Cirrhosis of the liver noted on CT in June.  She is being followed by gastroenterology for this.  Recent echocardiogram as documented aortic stenosis and mitral valve regurgitation.  She is denying shortness of breath or chest pain and dyspnea on exertion.  She is just tired.  Past Medical History:  Diagnosis Date   Anxiety    Carpal tunnel syndrome    Deafness in left ear    Depression    Diabetes mellitus    diet controlled   Diverticulitis    GERD (gastroesophageal reflux disease)    Hypercholesteremia    Hypertension    Hypothyroid    Memory loss    reports d/t concussion   PONV (postoperative nausea and vomiting) yrs ago, none recent   Post concussion syndrome    Stroke [facility])    reports they were silent    Past Surgical History:  Procedure Laterality Date   ABDOMINAL HYSTERECTOMY  1986   BACK SURGERY  2010   lower   BIOPSY  01/12/2018   Procedure: BIOPSY;  Surgeon: Irving Copas., MD;  Location: [facility];  Service: Gastroenterology;;   CHOLECYSTECTOMY     ESOPHAGOGASTRODUODENOSCOPY (EGD) WITH PROPOFOL N/A 01/12/2018   Procedure: ESOPHAGOGASTRODUODENOSCOPY (EGD) WITH PROPOFOL;   Surgeon: Irving Copas., MD;  Location: [facility];  Service: Gastroenterology;  Laterality: N/A;   left elobow surgery  2003   left rotator cuff  2001   LUMBAR LAMINECTOMY/DECOMPRESSION MICRODISCECTOMY Left 12/23/2017   Procedure: Laminectomy for facet/synovial cyst - left - Lumbar three-Lumbar four;  Surgeon: Earnie Larsson, MD;  Location: [facility];  Service: Neurosurgery;  Laterality: Left;   r foot surgery  1995   REVERSE SHOULDER ARTHROPLASTY Left 10/23/2018   Procedure: REVERSE TOTAL SHOULDER ARTHROPLASTY;  Surgeon: Netta Cedars, MD;  Location[facility];  Service: Orthopedics;  Laterality: Left;  interscalene block   right hand surgery  2001   right index finger surgery  2009   SHOULDER OPEN ROTATOR CUFF REPAIR  11/21/2011   Procedure: ROTATOR CUFF REPAIR SHOULDER OPEN;  Surgeon: Johnn Hai, MD;  [facility];  Service: Orthopedics;  Laterality: Right;  with subacromial decompression   SHOULDER OPEN ROTATOR CUFF REPAIR Right 05/09/2016   Procedure: Right shoulder mini open revision rotator cuff repair, subacromial decompression;  Surgeon: Susa Day, MD;  Location: [facility];  Service: Orthopedics;  Laterality: Right;   TOTAL HIP ARTHROPLASTY Right 04/24/2017   Procedure: RIGHT TOTAL HIP ARTHROPLASTY ANTERIOR APPROACH;  Surgeon: Rod Can, MD;  [facility];  Service: Orthopedics;  Laterality: Right;  Needs RNFA    Family History  Problem Relation Age of Onset  Heart attack Mother    Stroke Father    Heart attack Father    Stroke Sister    Stroke Brother    Diabetes Brother    Dementia Brother    Heart disease Brother    Stroke Sister        TIA    Social History   Socioeconomic History   Marital status: Married    Spouse name: Herschel   Number of children: 3   Years of education: 14   Highest education level: Not on file  Occupational History   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  Tobacco Use   Smoking status: Never   Smokeless  tobacco: Never  Vaping Use   Vaping Use: Never used  Substance and Sexual Activity   Alcohol use: Yes    Alcohol/week: 1.0 standard drink    Types: 1 Glasses of wine per week    Comment: occas   Drug use: No   Sexual activity: Not Currently    Comment: 1st intercourse 81 yo-Fewer than 5 partners  Other Topics Concern   Not on file  Social History Narrative   Married, lives at home with husband   caffeine - 1 Coke daily   Social Determinants of Health   Financial Resource Strain: Not on file  Food Insecurity: Not on file  Transportation Needs: Not on file  Physical Activity: Not on file  Stress: Not on file  Social Connections: Not on file  Intimate Partner Violence: Not on file    Outpatient Medications Prior to Visit  Medication Sig Dispense Refill   alendronate (FOSAMAX) 70 MG tablet Take 1 tablet (70 mg total) by mouth every Sunday. Take with a full glass of water on an empty stomach. 12 tablet 3   amLODipine (NORVASC) 5 MG tablet Take 1.5 tablets (7.5 mg total) by mouth daily. 135 tablet 1   Calcium Carbonate-Vitamin D (CALCIUM-D PO) Take 2 tablets by mouth daily. CHEWABLES     Cholecalciferol 25 MCG (1000 UT) tablet Take by mouth.     escitalopram (LEXAPRO) 20 MG tablet TAKE ONE TABLET BY MOUTH DAILY 90 tablet 0   esomeprazole (NEXIUM) 40 MG capsule Take 1 capsule (40 mg total) by mouth 2 (two) times daily before a meal. 60 capsule 0   ezetimibe (ZETIA) 10 MG tablet Take 1 tablet (10 mg total) by mouth daily. 90 tablet 2   glucose blood test strip daily.     levothyroxine (SYNTHROID) 75 MCG tablet TAKE ONE TABLET BY MOUTH DAILY WITH BREAKFAST 90 tablet 2   Multiple Vitamins-Minerals (WOMENS MULTI GUMMIES PO) Take 2 tablets by mouth daily.     oxybutynin (DITROPAN XL) 15 MG 24 hr tablet Pt states she takes 1/2 pill at bedtime.     rosuvastatin (CRESTOR) 10 MG tablet TAKE ONE TABLET BY MOUTH DAILY 90 tablet 0   traZODone (DESYREL) 50 MG tablet Take 0.5 tablets (25 mg  total) by mouth at bedtime. 45 tablet 1   COVID-19 mRNA bivalent vaccine, Pfizer, injection Inject into the muscle. 0.3 mL 0   cyanocobalamin 100 MCG tablet Take by mouth. (Patient not taking: Reported on 03/20/2021)     Ginkgo Biloba 40 MG CAPS See admin instructions.     amoxicillin-clavulanate (AUGMENTIN) 875-125 MG tablet Take 1 tablet by mouth 2 (two) times daily. 20 tablet 0   Milk Thistle Extract 175 MG TABS Take by mouth.     VITAMIN D PO Take by mouth.  No facility-administered medications prior to visit.    Allergies  Allergen Reactions   Other Other (See Comments) and Rash    Tomato sauce, garlic, onion - severe acid reflux    Flexeril [Cyclobenzaprine] Other (See Comments)    Per spouse "she felt like a zombie"   Atorvastatin Other (See Comments)    Unbalanced   Chlordiazepoxide-Clidinium Other (See Comments)    Dizziness (intolerance)   Codeine Nausea Only and Rash    ROS Review of Systems  Constitutional:  Positive for fatigue. Negative for chills, diaphoresis and fever.  HENT: Negative.    Eyes:  Negative for photophobia and visual disturbance.  Respiratory:  Negative for chest tightness, shortness of breath and wheezing.   Cardiovascular:  Negative for chest pain and palpitations.  Gastrointestinal:  Positive for constipation. Negative for diarrhea, nausea and vomiting.  Genitourinary: Negative.  Negative for difficulty urinating, frequency and urgency.  Musculoskeletal:  Positive for arthralgias.  Neurological:  Negative for speech difficulty, weakness and numbness.  Hematological:  Does not bruise/bleed easily.     Objective:    Physical Exam Vitals reviewed.  Constitutional:      General: She is not in acute distress.    Appearance: Normal appearance. She is not ill-appearing, toxic-appearing or diaphoretic.  HENT:     Head: Normocephalic and atraumatic.     Right Ear: External ear normal.     Left Ear: External ear normal.     Mouth/Throat:      Mouth: Mucous membranes are moist.     Pharynx: Oropharynx is clear. No oropharyngeal exudate or posterior oropharyngeal erythema.  Eyes:     General: No scleral icterus.       Right eye: No discharge.        Left eye: No discharge.     Extraocular Movements: Extraocular movements intact.     Conjunctiva/sclera: Conjunctivae normal.     Pupils: Pupils are equal, round, and reactive to light.  Cardiovascular:     Rate and Rhythm: Normal rate and regular rhythm.     Heart sounds: Murmur heard.  Pulmonary:     Effort: Pulmonary effort is normal.     Breath sounds: Normal breath sounds.  Abdominal:     General: Bowel sounds are normal. There is no distension.     Palpations: There is no mass.     Tenderness: There is no abdominal tenderness (non reproduciable ttp.). There is no guarding or rebound.     Hernia: No hernia is present.  Musculoskeletal:     Cervical back: No rigidity or tenderness.     Right lower leg: No edema.     Left lower leg: No edema.  Lymphadenopathy:     Cervical: No cervical adenopathy.  Skin:    General: Skin is warm and dry.     Coloration: Skin is not jaundiced.  Neurological:     Mental Status: She is alert.  Psychiatric:        Mood and Affect: Mood normal.        Behavior: Behavior normal.    BP 122/64 (BP Location: Right Arm, Patient Position: Sitting, Cuff Size: Normal)    Pulse 63    Temp (!) 97 F (36.1 C) (Temporal)    Ht 5' 3"  (1.6 m)    Wt 140 lb 6.4 oz (63.7 kg)    SpO2 95%    BMI 24.87 kg/m  Wt Readings from Last 3 Encounters:  03/20/21 140 lb 6.4 oz (63.7 kg)  09/29/20 140 lb (63.5 kg)  09/21/20 140 lb (63.5 kg)     Health Maintenance Due  Topic Date Due   FOOT EXAM  Never done   OPHTHALMOLOGY EXAM  Never done   HEMOGLOBIN A1C  07/21/2020   URINE MICROALBUMIN  07/25/2020    There are no preventive care reminders to display for this patient.  Lab Results  Component Value Date   TSH 3.740 01/21/2020   Lab Results  Component  Value Date   WBC 6.8 06/22/2020   HGB 13.6 06/22/2020   HCT 41.1 06/22/2020   MCV 83.2 06/22/2020   PLT 179.0 06/22/2020   Lab Results  Component Value Date   NA 136 06/22/2020   K 4.2 06/22/2020   CO2 26 06/22/2020   GLUCOSE 114 (H) 06/22/2020   BUN 16 06/22/2020   CREATININE 0.92 06/22/2020   BILITOT 0.4 07/25/2020   ALKPHOS 40 07/25/2020   AST 28 07/25/2020   ALT 27 07/25/2020   PROT 7.3 07/25/2020   ALBUMIN 4.2 07/25/2020   CALCIUM 9.6 06/22/2020   ANIONGAP 9 03/31/2020   EGFR 65 05/03/2020   GFR 59.85 (L) 06/22/2020   Lab Results  Component Value Date   CHOL 150 07/28/2020   Lab Results  Component Value Date   HDL 47.50 07/28/2020   Lab Results  Component Value Date   LDLCALC 63 07/28/2020   Lab Results  Component Value Date   TRIG 193.0 (H) 07/28/2020   Lab Results  Component Value Date   CHOLHDL 3 07/28/2020   Lab Results  Component Value Date   HGBA1C 6.7 (H) 01/21/2020      Assessment & Plan:   Problem List Items Addressed This Visit       Digestive   Slow transit constipation     Other   Fatigue - Primary   Relevant Orders   CBC   Basic metabolic panel   Hepatic function panel   Urinalysis, Routine w reflex microscopic   TSH   Vitamin B12   Anemia   Relevant Orders   CBC   Vitamin B12   Iron, TIBC and Ferritin Panel   Other Visit Diagnoses     Elevated LDL cholesterol level       Relevant Orders   LDL cholesterol, direct   Medication monitoring encounter       Relevant Orders   Vitamin B12       Meds ordered this encounter  Medications   cyanocobalamin ((VITAMIN B-12)) injection 1,000 mcg    Follow-up: Return in about 6 weeks (around 05/01/2021), or use miralax twice daily as needed.[name]  Spent over 40 minutes interviewing the patient with exam and review of the medical record.  Libby Maw, MD

## 2021-03-21 LAB — URINALYSIS, ROUTINE W REFLEX MICROSCOPIC
Bilirubin Urine: NEGATIVE
Hgb urine dipstick: NEGATIVE
Ketones, ur: NEGATIVE
Leukocytes,Ua: NEGATIVE
Nitrite: NEGATIVE
RBC / HPF: NONE SEEN (ref 0–?)
Specific Gravity, Urine: 1.015 (ref 1.000–1.030)
Total Protein, Urine: NEGATIVE
Urine Glucose: NEGATIVE
Urobilinogen, UA: 0.2 (ref 0.0–1.0)
pH: 6 (ref 5.0–8.0)

## 2021-03-21 LAB — BASIC METABOLIC PANEL
BUN: 12 mg/dL (ref 6–23)
CO2: 31 mEq/L (ref 19–32)
Calcium: 9.4 mg/dL (ref 8.4–10.5)
Chloride: 101 mEq/L (ref 96–112)
Creatinine, Ser: 0.92 mg/dL (ref 0.40–1.20)
GFR: 59.54 mL/min — ABNORMAL LOW (ref >60.00)
Glucose, Bld: 111 mg/dL — ABNORMAL HIGH (ref 70–99)
Potassium: 4.3 mEq/L (ref 3.5–5.1)
Sodium: 137 mEq/L (ref 135–145)

## 2021-03-21 LAB — CBC
HCT: 41.1 % (ref 36.0–46.0)
Hemoglobin: 13.7 g/dL (ref 12.0–15.0)
MCHC: 33.3 g/dL (ref 30.0–36.0)
MCV: 85.4 fl (ref 78.0–100.0)
Platelets: 193 10*3/uL (ref 150.0–400.0)
RBC: 4.81 Mil/uL (ref 3.87–5.11)
RDW: 14.5 % (ref 11.5–15.5)
WBC: 8.6 10*3/uL (ref 4.0–10.5)

## 2021-03-21 LAB — HEPATIC FUNCTION PANEL
ALT: 40 U/L — ABNORMAL HIGH (ref 0–35)
AST: 30 U/L (ref 0–37)
Albumin: 3.8 g/dL (ref 3.5–5.2)
Alkaline Phosphatase: 41 U/L (ref 39–117)
Bilirubin, Direct: 0.1 mg/dL (ref 0.0–0.3)
Total Bilirubin: 0.3 mg/dL (ref 0.2–1.2)
Total Protein: 6.8 g/dL (ref 6.0–8.3)

## 2021-03-21 LAB — LDL CHOLESTEROL, DIRECT: Direct LDL: 53 mg/dL

## 2021-03-21 LAB — TSH: TSH: 2.24 u[IU]/mL (ref 0.35–5.50)

## 2021-03-21 LAB — VITAMIN B12: Vitamin B-12: >1504 pg/mL — ABNORMAL HIGH (ref 211–911)

## 2021-03-23 ENCOUNTER — Other Ambulatory Visit: Payer: Self-pay | Admitting: Family Medicine

## 2021-03-23 DIAGNOSIS — E7800 Pure hypercholesterolemia, unspecified: Secondary | ICD-10-CM

## 2021-03-27 NOTE — Patient Instructions (Addendum)
DUE TO COVID-19 ONLY ONE VISITOR IS ALLOWED TO COME WITH YOU AND STAY IN THE WAITING ROOM ONLY DURING PRE OP AND PROCEDURE.   **NO VISITORS ARE ALLOWED IN THE SHORT STAY AREA OR RECOVERY ROOM!!**  IF YOU WILL BE ADMITTED INTO THE HOSPITAL YOU ARE ALLOWED ONLY TWO SUPPORT PEOPLE DURING VISITATION HOURS ONLY (7 AM -8PM)   The support person(s) must pass our screening, gel in and out, and wear a mask at all times, including in the patients room. Patients must also wear a mask when staff or their support person are in the room. Visitors GUEST BADGE MUST BE WORN VISIBLY  One adult visitor may remain with you overnight and MUST be in the room by 8 P.M.  No visitors under the age of 38. Any visitor under the age of 12 must be accompanied by an adult.    COVID SWAB TESTING MUST BE COMPLETED ON: 04/18/21    Site: [facility] [place] Enter: Main Entrance have a seat in the waiting area to the right of main entrance (DO NOT Laie!!!!!) Dial: [phone] to alert staff you have arrived  You are not required to quarantine, however you are required to wear a well-fitted mask when you are out and around people not in your household.  Hand Hygiene often Do NOT share personal items Notify your provider if you are in close contact with someone who has COVID or you develop fever 100.4 or greater, new onset of sneezing, cough, sore throat, shortness of breath or body aches.  [place], [address], must go inside of the hospital, NOT A DRIVE THRU!  (Must self quarantine after testing. Follow instructions on handout.)       Your procedure is scheduled on: 04/20/21   Report to [facility] Main Entrance    Report to short stay at: 5:15 AM   Call this number if you have problems the morning of surgery [phone]   Do not eat food :After Midnight.   May have liquids until : 4:15 AM    day of surgery  CLEAR LIQUID DIET  Foods Allowed                                                                     Foods Excluded  Water, Black Coffee and tea, regular and decaf                             liquids that you cannot  Plain Jell-O in any flavor  (No red)                                           see through such as: Fruit ices (not with fruit pulp)                                     milk, soups, orange juice  Iced Popsicles (No red)                                    All solid food                                   Apple juices Sports drinks like Gatorade (No red) Lightly seasoned clear broth or consume(fat free) Sugar Sample Menu Breakfast                                Lunch                                     Supper Cranberry juice                    Beef broth                            Chicken broth Jell-O                                     Grape juice                           Apple juice Coffee or tea                        Jell-O                                      Popsicle                                                Coffee or tea                        Coffee or tea    Oral Hygiene is also important to reduce your risk of infection.                                    Remember - BRUSH YOUR TEETH THE MORNING OF SURGERY WITH YOUR REGULAR TOOTHPASTE   Do NOT smoke after Midnight   Take these medicines the morning of surgery with A SIP OF WATER: escitalopram,amlodipine,levothyroxine,esomeprazole.  DO NOT TAKE ANY ORAL DIABETIC MEDICATIONS DAY OF YOUR SURGERY                              You may not have any metal on your body including hair pins, jewelry, and body piercing             Do not wear make-up, lotions, powders, perfumes/cologne, or deodorant  Do not wear nail polish including gel and S&S, artificial/acrylic nails, or any  other type of covering on natural nails including finger and toenails. If you have artificial nails, gel coating,  etc. that needs to be removed by a nail salon please have this removed prior to surgery or surgery may need to be canceled/ delayed if the surgeon/ anesthesia feels like they are unable to be safely monitored.   Do not shave  48 hours prior to surgery.    Do not bring valuables to the hospital. [place].   Contacts, dentures or bridgework may not be worn into surgery.   Bring small overnight bag day of surgery.    Patients discharged on the day of surgery will not be allowed to drive home.  Someone needs to stay with you for the first 24 hours after anesthesia.   Special Instructions: Bring a copy of your healthcare power of attorney and living will documents         the day of surgery if you haven't scanned them before.              Please read over the following fact sheets you were given: IF YOU HAVE QUESTIONS ABOUT YOUR PRE-OP INSTRUCTIONS PLEASE CALL [phone]     [facility] - Preparing for Surgery Before surgery, you can play an important role.  Because skin is not sterile, your skin needs to be as free of germs as possible.  You can reduce the number of germs on your skin by washing with CHG (chlorahexidine gluconate) soap before surgery.  CHG is an antiseptic cleaner which kills germs and bonds with the skin to continue killing germs even after washing. Please DO NOT use if you have an allergy to CHG or antibacterial soaps.  If your skin becomes reddened/irritated stop using the CHG and inform your nurse when you arrive at Short Stay. Do not shave (including legs and underarms) for at least 48 hours prior to the first CHG shower.  You may shave your face/neck. Please follow these instructions carefully:  1.  Shower with CHG Soap the night before surgery and the  morning of Surgery.  2.  If you choose to wash your hair, wash your hair first as usual with your  normal  shampoo.  3.  After you shampoo, rinse your hair and body  thoroughly to remove the  shampoo.                           4.  Use CHG as you would any other liquid soap.  You can apply chg directly  to the skin and wash                       Gently with a scrungie or clean washcloth.  5.  Apply the CHG Soap to your body ONLY FROM THE NECK DOWN.   Do not use on face/ open                           Wound or open sores. Avoid contact with eyes, ears mouth and genitals (private parts).                       Wash face,  Genitals (private parts) with your normal soap.  6.  Wash thoroughly, paying special attention to the area where your surgery  will be performed.  7.  Thoroughly rinse your body with warm water from the neck down.  8.  DO NOT shower/wash with your normal soap after using and rinsing off  the CHG Soap.                9.  Pat yourself dry with a clean towel.            10.  Wear clean pajamas.            11.  Place clean sheets on your bed the night of your first shower and do not  sleep with pets. Day of Surgery : Do not apply any lotions/deodorants the morning of surgery.  Please wear clean clothes to the hospital/surgery center.  FAILURE TO FOLLOW THESE INSTRUCTIONS MAY RESULT IN THE CANCELLATION OF YOUR SURGERY PATIENT SIGNATURE_________________________________  NURSE SIGNATURE__________________________________  ________________________________________________________________________  [facility]- Preparing for Total Shoulder Arthroplasty    Before surgery, you can play an important role. Because skin is not sterile, your skin needs to be as free of germs as possible. You can reduce the number of germs on your skin by using the following products. Benzoyl Peroxide Gel Reduces the number of germs present on the skin Applied twice a day to shoulder area starting two days before surgery    ==================================================================  Please follow these instructions carefully:  BENZOYL PEROXIDE 5%  GEL  Please do not use if you have an allergy to benzoyl peroxide.   If your skin becomes reddened/irritated stop using the benzoyl peroxide.  Starting two days before surgery, apply as follows: Apply benzoyl peroxide in the morning and at night. Apply after taking a shower. If you are not taking a shower clean entire shoulder front, back, and side along with the armpit with a clean wet washcloth.  Place a quarter-sized dollop on your shoulder and rub in thoroughly, making sure to cover the front, back, and side of your shoulder, along with the armpit.   2 days before ____ AM   ____ PM              1 day before ____ AM   ____ PM                         Do this twice a day for two days.  (Last application is the night before surgery, AFTER using the CHG soap as described below).  Do NOT apply benzoyl peroxide gel on the day of surgery.

## 2021-03-27 NOTE — Progress Notes (Signed)
Pt. Need orders for upcoming surgery.

## 2021-03-28 ENCOUNTER — Encounter (HOSPITAL_COMMUNITY)
Admission: RE | Admit: 2021-03-28 | Discharge: 2021-03-28 | Disposition: A | Discharge status: Home / Self Care | Payer: Medicare PPO | Source: Ambulatory Visit | Attending: Orthopedic Surgery | Admitting: Orthopedic Surgery

## 2021-03-28 ENCOUNTER — Encounter (HOSPITAL_COMMUNITY): Payer: Self-pay

## 2021-03-28 ENCOUNTER — Other Ambulatory Visit: Payer: Self-pay

## 2021-03-28 VITALS — BP 157/89 | HR 61 | Temp 98.6°F | Ht 63.5 in | Wt 137.0 lb

## 2021-03-28 DIAGNOSIS — E119 Type 2 diabetes mellitus without complications: Secondary | ICD-10-CM | POA: Diagnosis not present

## 2021-03-28 DIAGNOSIS — Z01818 Encounter for other preprocedural examination: Secondary | ICD-10-CM | POA: Diagnosis not present

## 2021-03-28 HISTORY — DX: Cardiac murmur, unspecified: R01.1

## 2021-03-28 HISTORY — DX: Unspecified osteoarthritis, unspecified site: M19.90

## 2021-03-28 HISTORY — DX: Malignant (primary) neoplasm, unspecified: C80.1

## 2021-03-28 LAB — CBC
HCT: 43.6 % (ref 36.0–46.0)
Hemoglobin: 13.9 g/dL (ref 12.0–15.0)
MCH: 28.5 pg (ref 26.0–34.0)
MCHC: 31.9 g/dL (ref 30.0–36.0)
MCV: 89.5 fL (ref 80.0–100.0)
Platelets: 203 10*3/uL (ref 150–400)
RBC: 4.87 MIL/uL (ref 3.87–5.11)
RDW: 14.6 % (ref 11.5–15.5)
WBC: 8.7 10*3/uL (ref 4.0–10.5)
nRBC: 0 % (ref 0.0–0.2)

## 2021-03-28 LAB — BASIC METABOLIC PANEL
Anion gap: 6 (ref 5–15)
BUN: 13 mg/dL (ref 8–23)
CO2: 27 mmol/L (ref 22–32)
Calcium: 9 mg/dL (ref 8.9–10.3)
Chloride: 103 mmol/L (ref 98–111)
Creatinine, Ser: 0.84 mg/dL (ref 0.44–1.00)
GFR, Estimated: >60 mL/min (ref >60)
Glucose, Bld: 109 mg/dL — ABNORMAL HIGH (ref 70–99)
Potassium: 3.7 mmol/L (ref 3.5–5.1)
Sodium: 136 mmol/L (ref 135–145)

## 2021-03-28 LAB — HEMOGLOBIN A1C
Hgb A1c MFr Bld: 6.5 % — ABNORMAL HIGH (ref 4.8–5.6)
Mean Plasma Glucose: 139.85 mg/dL

## 2021-03-28 LAB — SURGICAL PCR SCREEN
MRSA, PCR: NEGATIVE
Staphylococcus aureus: POSITIVE — AB

## 2021-03-28 LAB — GLUCOSE, CAPILLARY: Glucose-Capillary: 124 mg/dL — ABNORMAL HIGH (ref 70–99)

## 2021-03-28 NOTE — Progress Notes (Signed)
PCR: + STAPH °

## 2021-03-28 NOTE — Progress Notes (Signed)
COVID Vaccine Completed: Yes Date COVID Vaccine completed: 12/30/20 x 4 COVID vaccine manufacturer: [employer] Test: 04/18/21 @ 1:00 PM Bowel prep reminder: N/A  PCP - Dr. Abelino Derrick. LOV: 03/20/21 Cardiologist - Dr. [name] O'Neal  Chest x-ray -  EKG -  Stress Test -  ECHO - 04/13/20 Cardiac Cath -  Pacemaker/ICD device last checked:  Sleep Study -  CPAP -   Fasting Blood Sugar -  Checks Blood Sugar _____ times a day  Blood Thinner Instructions: Aspirin Instructions: Last Dose:  Anesthesia review: Hx: DIA,HTN,Stroke,Heart Murmur  Patient denies shortness of breath, fever, cough and chest pain at PAT appointment   Patient verbalized understanding of instructions that were given to them at the PAT appointment. Patient was also instructed that they will need to review over the PAT instructions again at home before surgery.

## 2021-04-02 DIAGNOSIS — M79641 Pain in right hand: Secondary | ICD-10-CM | POA: Diagnosis not present

## 2021-04-02 DIAGNOSIS — G5601 Carpal tunnel syndrome, right upper limb: Secondary | ICD-10-CM | POA: Diagnosis not present

## 2021-04-02 DIAGNOSIS — M189 Osteoarthritis of first carpometacarpal joint, unspecified: Secondary | ICD-10-CM | POA: Diagnosis not present

## 2021-04-02 DIAGNOSIS — R2231 Localized swelling, mass and lump, right upper limb: Secondary | ICD-10-CM | POA: Diagnosis not present

## 2021-04-02 DIAGNOSIS — M1811 Unilateral primary osteoarthritis of first carpometacarpal joint, right hand: Secondary | ICD-10-CM | POA: Diagnosis not present

## 2021-04-03 NOTE — H&P (Signed)
Patient's anticipated LOS is less than 2 midnights, meeting these requirements: - Younger than 71 - Lives within 1 hour of care - Has a competent adult at home to recover with post-op recover - NO history of  - Chronic pain requiring opiods  - Diabetes  - Coronary Artery Disease  - Heart failure  - Heart attack  - Stroke  - DVT/VTE  - Cardiac arrhythmia  - Respiratory Failure/COPD  - Renal failure  - Anemia  - Advanced Liver disease     Cheryl Cooper is an 81 y.o. female.    Chief Complaint: right shoulder pain  HPI: Pt is a 81 y.o. female complaining of right shoulder pain for multiple years. Pain had continually increased since the beginning. X-rays in the clinic show end-stage arthritic changes of the right shoulder. Pt has tried various conservative treatments which have failed to alleviate their symptoms, including injections and therapy. Various options are discussed with the patient. Risks, benefits and expectations were discussed with the patient. Patient understand the risks, benefits and expectations and wishes to proceed with surgery.   PCP:  Libby Maw, MD  D/C Plans: Home  PMH: Past Medical History:  Diagnosis Date   Anxiety    Arthritis    Cancer ([place])    Carpal tunnel syndrome    Deafness in left ear    Depression    Diabetes mellitus    diet controlled   Diverticulitis    GERD (gastroesophageal reflux disease)    Heart murmur    Hypercholesteremia    Hypertension    Hypothyroid    Memory loss    reports d/t concussion   PONV (postoperative nausea and vomiting) yrs ago, none recent   Post concussion syndrome    Stroke [facility])    reports they were silent    PSH: Past Surgical History:  Procedure Laterality Date   ABDOMINAL HYSTERECTOMY  1986   BACK SURGERY  2010   lower   BIOPSY  01/12/2018   Procedure: BIOPSY;  Surgeon: Irving Copas., MD;  Location: [facility];  Service: Gastroenterology;;   CHOLECYSTECTOMY      ESOPHAGOGASTRODUODENOSCOPY (EGD) WITH PROPOFOL N/A 01/12/2018   Procedure: ESOPHAGOGASTRODUODENOSCOPY (EGD) WITH PROPOFOL;  Surgeon: Irving Copas., MD;  Location: [facility];  Service: Gastroenterology;  Laterality: N/A;   left elobow surgery  2003   left rotator cuff  2001   LUMBAR LAMINECTOMY/DECOMPRESSION MICRODISCECTOMY Left 12/23/2017   Procedure: Laminectomy for facet/synovial cyst - left - Lumbar three-Lumbar four;  Surgeon: Earnie Larsson, MD;  Location: [place];  Service: Neurosurgery;  Laterality: Left;   r foot surgery  1995   REVERSE SHOULDER ARTHROPLASTY Left 10/23/2018   Procedure: REVERSE TOTAL SHOULDER ARTHROPLASTY;  Surgeon: Netta Cedars, MD;  Location: [facility];  Service: Orthopedics;  Laterality: Left;  interscalene block   right hand surgery  2001   right index finger surgery  2009   SHOULDER OPEN ROTATOR CUFF REPAIR  11/21/2011   Procedure: ROTATOR CUFF REPAIR SHOULDER OPEN;  Surgeon: Johnn Hai, MD;  Location: [facility];  Service: Orthopedics;  Laterality: Right;  with subacromial decompression   SHOULDER OPEN ROTATOR CUFF REPAIR Right 05/09/2016   Procedure: Right shoulder mini open revision rotator cuff repair, subacromial decompression;  Surgeon: Susa Day, MD;  Location: [facility];  Service: Orthopedics;  Laterality: Right;   TOTAL HIP ARTHROPLASTY Right 04/24/2017   Procedure: RIGHT TOTAL HIP ARTHROPLASTY ANTERIOR APPROACH;  Surgeon: Rod Can, MD;  Location: [facility];  Service: Orthopedics;  Laterality: Right;  Needs RNFA    Social History:  reports that she has never smoked. She has never used smokeless tobacco. She reports current alcohol use of about 1.0 standard drink per week. She reports that she does not use drugs.  Allergies:  Allergies  Allergen Reactions   Other Other (See Comments) and Rash    Tomato sauce, garlic, onion - severe acid reflux    Flexeril [Cyclobenzaprine] Other (See Comments)    Per spouse "she felt like a zombie"   Atorvastatin  Other (See Comments)    Unbalanced   Chlordiazepoxide-Clidinium Other (See Comments)    Dizziness (intolerance)   Codeine Nausea Only and Rash    Medications: No current facility-administered medications for this encounter.   Current Outpatient Medications  Medication Sig Dispense Refill   alendronate (FOSAMAX) 70 MG tablet Take 1 tablet (70 mg total) by mouth every Sunday. Take with a full glass of water on an empty stomach. 12 tablet 3   amLODipine (NORVASC) 5 MG tablet Take 1.5 tablets (7.5 mg total) by mouth daily. 135 tablet 1   Calcium Carbonate-Vitamin D (CALCIUM-D PO) Take 2 tablets by mouth daily. CHEWABLES     Carboxymethylcellulose Sodium (EYE DROPS OP) Place 1 drop into both eyes 2 (two) times daily.     Cholecalciferol 25 MCG (1000 UT) tablet Take 1,000 Units by mouth daily.     cyanocobalamin 100 MCG tablet Take 100 mcg by mouth daily.     escitalopram (LEXAPRO) 20 MG tablet TAKE ONE TABLET BY MOUTH DAILY 90 tablet 0   esomeprazole (NEXIUM) 40 MG capsule Take 1 capsule (40 mg total) by mouth 2 (two) times daily before a meal. 60 capsule 0   ezetimibe (ZETIA) 10 MG tablet Take 1 tablet (10 mg total) by mouth daily. 90 tablet 2   Ginkgo Biloba 40 MG CAPS Take 40 mg by mouth daily.     levothyroxine (SYNTHROID) 75 MCG tablet TAKE ONE TABLET BY MOUTH DAILY WITH BREAKFAST 90 tablet 2   Multiple Vitamins-Minerals (WOMENS MULTI GUMMIES PO) Take 2 tablets by mouth daily.     oxybutynin (DITROPAN XL) 15 MG 24 hr tablet 7.5 mg at bedtime.     Probiotic Product (PROBIOTIC PO) Take 1 tablet by mouth daily.     rosuvastatin (CRESTOR) 10 MG tablet TAKE ONE TABLET BY MOUTH DAILY 90 tablet 0   traMADol (ULTRAM) 50 MG tablet Take 50 mg by mouth every 8 (eight) hours.     traZODone (DESYREL) 50 MG tablet Take 0.5 tablets (25 mg total) by mouth at bedtime. 45 tablet 1   glucose blood test strip daily.      No results found for this or any previous visit (from the past 48 hour(s)). No  results found.  ROS: Pain with rom of the right upper extremity  Physical Exam: Alert and oriented 81 y.o. female in no acute distress Cranial nerves 2-12 intact Cervical spine: full rom with no tenderness, nv intact distally Chest: active breath sounds bilaterally, no wheeze rhonchi or rales Heart: regular rate and rhythm, no murmur Abd: non tender non distended with active bowel sounds Hip is stable with rom  Right shoulder painful and weak rom Nv intact distally No rashes or edema distally  Assessment/Plan Assessment: right shoulder cuff arthropathy  Plan:  Patient will undergo a right reverse total shoulder by Dr. Veverly Fells at [facility] benefits and expectations were discussed with the patient. Patient understand risks, benefits and expectations and  wishes to proceed. Preoperative templating of the joint replacement has been completed, documented, and submitted to the Operating Room personnel in order to optimize intra-operative equipment management.   Merla Riches PA-C, MPAS [facility] is now [employer] [address], [place], [place], [place] [address] Phone: [phone] [email or web address]   [employer]

## 2021-04-04 ENCOUNTER — Other Ambulatory Visit: Payer: Self-pay | Admitting: Family Medicine

## 2021-04-04 DIAGNOSIS — M8589 Other specified disorders of bone density and structure, multiple sites: Secondary | ICD-10-CM | POA: Diagnosis not present

## 2021-04-04 DIAGNOSIS — M810 Age-related osteoporosis without current pathological fracture: Secondary | ICD-10-CM | POA: Diagnosis not present

## 2021-04-04 DIAGNOSIS — Z780 Asymptomatic menopausal state: Secondary | ICD-10-CM | POA: Diagnosis not present

## 2021-04-05 ENCOUNTER — Ambulatory Visit (HOSPITAL_COMMUNITY): Payer: Medicare PPO | Attending: Internal Medicine

## 2021-04-05 ENCOUNTER — Other Ambulatory Visit: Payer: Self-pay

## 2021-04-05 DIAGNOSIS — I350 Nonrheumatic aortic (valve) stenosis: Secondary | ICD-10-CM | POA: Diagnosis not present

## 2021-04-05 LAB — ECHOCARDIOGRAM COMPLETE
AR max vel: 1.06 cm2
AV Area VTI: 1.21 cm2
AV Area mean vel: 1.08 cm2
AV Mean grad: 34 mmHg
AV Peak grad: 60.2 mmHg
Ao pk vel: 3.88 m/s
Area-P 1/2: 2.99 cm2
S' Lateral: 2.1 cm

## 2021-04-09 ENCOUNTER — Telehealth: Payer: Self-pay | Admitting: Family Medicine

## 2021-04-09 NOTE — Telephone Encounter (Signed)
Patient has refill at pharmacy, called patient to inform no answer LM on identified VM

## 2021-04-09 NOTE — Telephone Encounter (Signed)
What is the name of the medication? Cheryl Cooper (DESYREL) 50 MG tablet [[id number]]  Have you contacted your pharmacy to request a refill? She is needing a refill on this script. She is having shoulder surgery next week and really wants this taken care of before hand.   Which pharmacy would you like this sent to? [id number] [place], [place] - [address]  [address] [place], [place] [address]  Phone:  [phone]  Fax:  [phone]    Patient notified that their request is being sent to the clinical staff for review and that they should receive a call once it is complete. If they do not receive a call within 72 hours they can check with their pharmacy or our office.

## 2021-04-09 NOTE — Progress Notes (Addendum)
Anesthesia Chart Review   Case: [id number] Date/Time: 04/20/21 0700   Procedure: REVERSE SHOULDER ARTHROPLASTY (Right: Shoulder) - with ISB   Anesthesia type: General   Pre-op diagnosis: right shoulder cuff arthropathy   Location: [facility] 06 / [facility]   Surgeons: Netta Cedars, MD       DISCUSSION:81 y.o. never smoker with h/o HTN, PONV, DM II, moderate AS (mean gradient 34.0 mmHg, valve area 1.21 cm2 on Echo 04/05/2021), right shoulder OA scheduled for above procedure 04/20/21 with Dr. Netta Cedars.   Pt seen by cardiology 04/13/2021. Per OV note, "-RCRI equals 1 (stroke.  This equates to a 0.9% (low risk) risk of major perioperative cardiovascular event.  No history of CHF, MI, CKD, diabetes on insulin.  She can complete greater than 4 METS.  EKG in office is normal.  Moderate to severe aortic stenosis.  Safe to proceed with surgery given lack of symptoms and ability to complete greater than 4 METS."  Anticipate pt can proceed with planned procedure barring acute status change.   VS: BP (!) 157/89    Pulse 61    Temp 37 C (Oral)    Ht 5' 3.5" (1.613 m)    Wt 62.1 kg    SpO2 95%    BMI 23.89 kg/m   PROVIDERS: Libby Maw, MD is PCP   O'Neal, Cassie Freer, MD is Cardiologist  LABS: Labs reviewed: Acceptable for surgery. (all labs ordered are listed, but only abnormal results are displayed)  Labs Reviewed  SURGICAL PCR SCREEN - Abnormal; Notable for the following components:      Result Value   Staphylococcus aureus POSITIVE (*)    All other components within normal limits  HEMOGLOBIN A1C - Abnormal; Notable for the following components:   Hgb A1c MFr Bld 6.5 (*)    All other components within normal limits  BASIC METABOLIC PANEL - Abnormal; Notable for the following components:   Glucose, Bld 109 (*)    All other components within normal limits  GLUCOSE, CAPILLARY - Abnormal; Notable for the following components:   Glucose-Capillary 124 (*)    All other components within  normal limits  CBC     IMAGES:   EKG: 03/28/2021 Rate 60 bpm  NSR LAD  CV: Echo 04/05/2021 1. Left ventricular diastolic parameters are consistent with Grade I  diastolic dysfunction (impaired relaxation). Elevated left ventricular  end-diastolic pressure. The E/e' is 19. The average left ventricular  global longitudinal strain is -23.4 %. The  global longitudinal strain is normal   2. Left ventricular ejection fraction, by estimation, is >75%. Left  ventricular ejection fraction by PLAX is 78 %. The left ventricle has  hyperdynamic function. The left ventricle has no regional wall motion  abnormalities. There is moderate left  ventricular hypertrophy of the basal-septal segment. There appears to be  some dynamic LVOT obstruction as well with increased LVOT gradient up to  54 mmHg associated with valsalva.   3. Right ventricular systolic function is normal. The right ventricular  size is normal. Tricuspid regurgitation signal is inadequate for assessing  PA pressure.   4. The mitral valve is abnormal. Trivial mitral valve regurgitation.   5. The aortic valve is tricuspid. There is moderate calcification of the  aortic valve. Aortic valve regurgitation is trivial. Moderate aortic valve  stenosis. Aortic valve area, by VTI measures 1.21 cm. Aortic valve mean  gradient measures 34.0 mmHg.  Aortic valve Vmax measures 3.88 m/s. Sharma Covert is 0.32.   6.  The inferior vena cava is normal in size with greater than 50%  respiratory variability, suggesting right atrial pressure of 3 mmHg.   7. Cannot exclude a small PFO.  Past Medical History:  Diagnosis Date   Anxiety    Arthritis    Cancer ([place])    Carpal tunnel syndrome    Deafness in left ear    Depression    Diabetes mellitus    diet controlled   Diverticulitis    GERD (gastroesophageal reflux disease)    Heart murmur    Hypercholesteremia    Hypertension    Hypothyroid    Memory loss    reports d/t concussion   PONV  (postoperative nausea and vomiting) yrs ago, none recent   Post concussion syndrome    Stroke [facility])    reports they were silent    Past Surgical History:  Procedure Laterality Date   [place]  2010   lower   BIOPSY  01/12/2018   Procedure: BIOPSY;  Surgeon: Irving Copas., MD;  Location: [facility];  Service: Gastroenterology;;   CHOLECYSTECTOMY     ESOPHAGOGASTRODUODENOSCOPY (EGD) WITH PROPOFOL N/A 01/12/2018   Procedure: ESOPHAGOGASTRODUODENOSCOPY (EGD) WITH PROPOFOL;  Surgeon: Irving Copas., MD;  Location: [facility];  Service: Gastroenterology;  Laterality: N/A;   left elobow surgery  2003   left rotator cuff  2001   LUMBAR LAMINECTOMY/DECOMPRESSION MICRODISCECTOMY Left 12/23/2017   Procedure: Laminectomy for facet/synovial cyst - left - Lumbar three-Lumbar four;  Surgeon: Earnie Larsson, MD;  Location: [place];  Service: Neurosurgery;  Laterality: Left;   r foot surgery  1995   REVERSE SHOULDER ARTHROPLASTY Left 10/23/2018   Procedure: REVERSE TOTAL SHOULDER ARTHROPLASTY;  Surgeon: Netta Cedars, MD;  [facility];  Service: Orthopedics;  Laterality: Left;  interscalene block   right hand surgery  2001   right index finger surgery  2009   SHOULDER OPEN ROTATOR CUFF REPAIR  11/21/2011   Procedure: ROTATOR CUFF REPAIR SHOULDER OPEN;  Surgeon: Johnn Hai, MD;  [facility];  Service: Orthopedics;  Laterality: Right;  with subacromial decompression   SHOULDER OPEN ROTATOR CUFF REPAIR Right 05/09/2016   Procedure: Right shoulder mini open revision rotator cuff repair, subacromial decompression;  Surgeon: Susa Day, MD;  [facility];  Service: Orthopedics;  Laterality: Right;   TOTAL HIP ARTHROPLASTY Right 04/24/2017   Procedure: RIGHT TOTAL HIP ARTHROPLASTY ANTERIOR APPROACH;  Surgeon: Rod Can, MD;  [facility];  Service: Orthopedics;  Laterality: Right;  Needs RNFA    MEDICATIONS:  alendronate  (FOSAMAX) 70 MG tablet   amLODipine (NORVASC) 5 MG tablet   Calcium Carbonate-Vitamin D (CALCIUM-D PO)   Carboxymethylcellulose Sodium (EYE DROPS OP)   Cholecalciferol 25 MCG (1000 UT) tablet   cyanocobalamin 100 MCG tablet   escitalopram (LEXAPRO) 20 MG tablet   esomeprazole (NEXIUM) 40 MG capsule   ezetimibe (ZETIA) 10 MG tablet   Ginkgo Biloba 40 MG CAPS   glucose blood test strip   levothyroxine (SYNTHROID) 75 MCG tablet   Multiple Vitamins-Minerals (WOMENS MULTI GUMMIES PO)   oxybutynin (DITROPAN XL) 15 MG 24 hr tablet   Probiotic Product (PROBIOTIC PO)   rosuvastatin (CRESTOR) 10 MG tablet   traMADol (ULTRAM) 50 MG tablet   traZODone (DESYREL) 50 MG tablet   No current facility-administered medications for this encounter.     Konrad Felix Ward, PA-C [facility] [phone]

## 2021-04-10 ENCOUNTER — Other Ambulatory Visit: Payer: Self-pay | Admitting: Family Medicine

## 2021-04-10 DIAGNOSIS — F32A Depression, unspecified: Secondary | ICD-10-CM

## 2021-04-10 MED ORDER — TRAZODONE HCL 50 MG PO TABS: 25.0000 mg | ORAL_TABLET | Freq: Every day | ORAL | 1 refills | Status: DC

## 2021-04-10 MED ORDER — OXYBUTYNIN CHLORIDE ER 15 MG PO TB24: 15.0000 mg | ORAL_TABLET | Freq: Every day | ORAL | 2 refills | Status: DC

## 2021-04-10 MED ORDER — ESCITALOPRAM OXALATE 20 MG PO TABS: 20.0000 mg | ORAL_TABLET | Freq: Every day | ORAL | 0 refills | Status: DC

## 2021-04-10 NOTE — Telephone Encounter (Signed)
Patient and pharmacy calling for refill on pending prescriptions. Lexapro and Trazodone patient has enough for about 3 weeks just wanting sent in. Oxybutynin was last filled by Dr. Carlota Raspberry but patient have switched PCP to Dr. Ethelene Hal. Please advise. Last OV 03/20/21

## 2021-04-10 NOTE — Telephone Encounter (Signed)
[facility] [id number] states they have not received the refilled for pts Lexapro, trazodone, and oxybutynin. I saw in the chart that the first 2 have been he would like for those to be resent. The oxybutynin has not been approved as I could see. Not sure what to tell him.

## 2021-04-11 DIAGNOSIS — E119 Type 2 diabetes mellitus without complications: Secondary | ICD-10-CM | POA: Diagnosis not present

## 2021-04-11 DIAGNOSIS — Z79890 Hormone replacement therapy (postmenopausal): Secondary | ICD-10-CM | POA: Diagnosis not present

## 2021-04-11 DIAGNOSIS — I10 Essential (primary) hypertension: Secondary | ICD-10-CM | POA: Diagnosis not present

## 2021-04-11 DIAGNOSIS — M8580 Other specified disorders of bone density and structure, unspecified site: Secondary | ICD-10-CM | POA: Diagnosis not present

## 2021-04-11 DIAGNOSIS — E039 Hypothyroidism, unspecified: Secondary | ICD-10-CM | POA: Diagnosis not present

## 2021-04-11 NOTE — Telephone Encounter (Signed)
Refills sent in, patient aware.  

## 2021-04-12 NOTE — Progress Notes (Signed)
Cardiology Office Note:   Date:  04/13/2021  NAME:  Cheryl Cooper    MRN: [id number] DOB:  1940-03-27   PCP:  Libby Maw, MD  Cardiologist:  None  Electrophysiologist:  None   Referring MD: Netta Cedars, MD   Chief Complaint  Patient presents with   Follow-up   History of Present Illness:   Cheryl Cooper is a 81 y.o. female with a hx of DM, HTN, HLD, moderate AS who presents for follow-up.  She presents for routine follow-up.  Echocardiogram shows she is approaching severe aortic stenosis.  Still moderate.  Murmur still more prominent.  She needs repeat echocardiogram in 6 months.  Denies any chest pain or trouble breathing she can, flight of stairs.  Diabetes well controlled.  Cholesterol well controlled.  BP controlled.  No symptoms of chest pain or trouble breathing.  She will have right shoulder surgery next week.  Can climb a flight of stairs without limitations.  Exercises twice per week.  Does strength training as well as aerobic activity.  No limitations.  Overall doing well without any major complaints today in office.  Problem List 1. DM -A1c 6.1 2. HLD -T chol 150, HDL 47, LDL 63, TG 193 3. HTN 4. Moderate aortic stenosis -03/31/2020: Vmax 3.6 m/s, MG 68mmHG, DI 0.40, AVA 1.03 cm2 -04/05/2020: Vmax 3.9 m/s, MG 34 mmHG, DI 0.32, AVA 1.21 cm2  Past Medical History: Past Medical History:  Diagnosis Date   Anxiety    Arthritis    Cancer ([place])    Carpal tunnel syndrome    Deafness in left ear    Depression    Diabetes mellitus    diet controlled   Diverticulitis    GERD (gastroesophageal reflux disease)    Heart murmur    Hypercholesteremia    Hypertension    Hypothyroid    Memory loss    reports d/t concussion   PONV (postoperative nausea and vomiting) yrs ago, none recent   Post concussion syndrome    Stroke [facility])    reports they were silent    Past Surgical History: Past Surgical History:  Procedure Laterality Date   ABDOMINAL  HYSTERECTOMY  1986   BACK SURGERY  2010   lower   BIOPSY  01/12/2018   Procedure: BIOPSY;  Surgeon: Irving Copas., MD;  [facility];  Service: Gastroenterology;;   CHOLECYSTECTOMY     ESOPHAGOGASTRODUODENOSCOPY (EGD) WITH PROPOFOL N/A 01/12/2018   Procedure: ESOPHAGOGASTRODUODENOSCOPY (EGD) WITH PROPOFOL;  Surgeon: Irving Copas., MD;  Location: [facility];  Service: Gastroenterology;  Laterality: N/A;   left elobow surgery  2003   left rotator cuff  2001   LUMBAR LAMINECTOMY/DECOMPRESSION MICRODISCECTOMY Left 12/23/2017   Procedure: Laminectomy for facet/synovial cyst - left - Lumbar three-Lumbar four;  Surgeon: Earnie Larsson, MD;  Location: [place];  Service: Neurosurgery;  Laterality: Left;   r foot surgery  1995   REVERSE SHOULDER ARTHROPLASTY Left 10/23/2018   Procedure: REVERSE TOTAL SHOULDER ARTHROPLASTY;  Surgeon: Netta Cedars, MD;  [facility];  Service: Orthopedics;  Laterality: Left;  interscalene block   right hand surgery  2001   right index finger surgery  2009   SHOULDER OPEN ROTATOR CUFF REPAIR  11/21/2011   Procedure: ROTATOR CUFF REPAIR SHOULDER OPEN;  Surgeon: Johnn Hai, MD;  [facility];  Service: Orthopedics;  Laterality: Right;  with subacromial decompression   SHOULDER OPEN ROTATOR CUFF REPAIR Right 05/09/2016   Procedure: Right shoulder mini open  revision rotator cuff repair, subacromial decompression;  Surgeon: Susa Day, MD;  [facility];  Service: Orthopedics;  Laterality: Right;   TOTAL HIP ARTHROPLASTY Right 04/24/2017   Procedure: RIGHT TOTAL HIP ARTHROPLASTY ANTERIOR APPROACH;  Surgeon: Rod Can, MD;  [facility];  Service: Orthopedics;  Laterality: Right;  Needs RNFA    Current Medications: Current Meds  Medication Sig   alendronate (FOSAMAX) 70 MG tablet Take 1 tablet (70 mg total) by mouth every Sunday. Take with a full glass of water on an empty stomach.   amLODipine (NORVASC) 5 MG tablet Take  1.5 tablets (7.5 mg total) by mouth daily.   Calcium Carbonate-Vitamin D (CALCIUM-D PO) Take 2 tablets by mouth daily. CHEWABLES   Carboxymethylcellulose Sodium (EYE DROPS OP) Place 1 drop into both eyes 2 (two) times daily.   Cholecalciferol 25 MCG (1000 UT) tablet Take 1,000 Units by mouth daily.   cyanocobalamin 100 MCG tablet Take 100 mcg by mouth daily.   escitalopram (LEXAPRO) 20 MG tablet Take 1 tablet (20 mg total) by mouth daily.   esomeprazole (NEXIUM) 40 MG capsule Take 1 capsule (40 mg total) by mouth 2 (two) times daily before a meal.   ezetimibe (ZETIA) 10 MG tablet Take 1 tablet (10 mg total) by mouth daily.   Ginkgo Biloba 40 MG CAPS Take 40 mg by mouth daily.   glucose blood test strip daily.   levothyroxine (SYNTHROID) 75 MCG tablet TAKE ONE TABLET BY MOUTH DAILY WITH BREAKFAST   Multiple Vitamins-Minerals (WOMENS MULTI GUMMIES PO) Take 2 tablets by mouth daily.   oxybutynin (DITROPAN XL) 15 MG 24 hr tablet Take 1 tablet (15 mg total) by mouth at bedtime.   Probiotic Product (PROBIOTIC PO) Take 1 tablet by mouth daily.   rosuvastatin (CRESTOR) 10 MG tablet TAKE ONE TABLET BY MOUTH DAILY   traMADol (ULTRAM) 50 MG tablet Take 50 mg by mouth every 8 (eight) hours.   traZODone (DESYREL) 50 MG tablet Take 0.5 tablets (25 mg total) by mouth at bedtime.     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  Tobacco Use   Smoking status: Never   Smokeless tobacco: Never  Vaping Use   Vaping Use: Never used  Substance and Sexual Activity   Alcohol use: Yes    Alcohol/week: 1.0 standard drink    Types: 1 Glasses of wine per week    Comment:  occas   Drug use: No   Sexual activity: Not Currently    Comment: 1st intercourse 81 yo-Fewer than 5 partners  Other Topics Concern   Not on file  Social History Narrative   Married, lives at home with husband   caffeine - 1 Coke daily   Social Determinants of Health   Financial Resource Strain: Not on file  Food Insecurity: Not on file  Transportation Needs: Not on file  Physical Activity: Not on file  Stress: Not on file  Social Connections: Not on file     Family History: The patient's family history includes Dementia in her brother; Diabetes in her brother; Heart attack in her father and mother; Heart disease in her brother; Stroke in her  brother, father, sister, and sister.  ROS:   All other ROS reviewed and negative. Pertinent positives noted in the HPI.     EKGs/Labs/Other Studies Reviewed:   The following studies were personally reviewed by me today:  EKG:  EKG is ordered today.  The ekg ordered today demonstrates normal sinus rhythm heart rate 63, no acute ischemic changes or evidence of infarction, and was personally reviewed by me.   TTE 04/05/2021  1. Left ventricular diastolic parameters are consistent with Grade I  diastolic dysfunction (impaired relaxation). Elevated left ventricular  end-diastolic pressure. The E/e' is 62. The average left ventricular  global longitudinal strain is -23.4 %. The  global longitudinal strain is normal   2. Left ventricular ejection fraction, by estimation, is >75%. Left  ventricular ejection fraction by PLAX is 78 %. The left ventricle has  hyperdynamic function. The left ventricle has no regional wall motion  abnormalities. There is moderate left  ventricular hypertrophy of the basal-septal segment. There appears to be  some dynamic LVOT obstruction as well with increased LVOT gradient up to  54 mmHg associated with valsalva.   3. Right ventricular systolic function is normal. The right ventricular  size is normal. Tricuspid  regurgitation signal is inadequate for assessing  PA pressure.   4. The mitral valve is abnormal. Trivial mitral valve regurgitation.   5. The aortic valve is tricuspid. There is moderate calcification of the  aortic valve. Aortic valve regurgitation is trivial. Moderate aortic valve  stenosis. Aortic valve area, by VTI measures 1.21 cm. Aortic valve mean  gradient measures 34.0 mmHg.  Aortic valve Vmax measures 3.88 m/s. Sharma Covert is 0.32.   6. The inferior vena cava is normal in size with greater than 50%  respiratory variability, suggesting right atrial pressure of 3 mmHg.   7. Cannot exclude a small PFO.   Recent Labs: 03/20/2021: ALT 40; TSH 2.24 03/28/2021: BUN 13; Creatinine, Ser 0.84; Hemoglobin 13.9; Platelets 203; Potassium 3.7; Sodium 136   Recent Lipid Panel    Component Value Date/Time   CHOL 150 07/28/2020 0856   CHOL 147 01/21/2020 0958   TRIG 193.0 (H) 07/28/2020 0856   HDL 47.50 07/28/2020 0856   HDL 48 01/21/2020 0958   CHOLHDL 3 07/28/2020 0856   VLDL 38.6 07/28/2020 0856   LDLCALC 63 07/28/2020 0856   LDLCALC 77 01/21/2020 0958   LDLDIRECT 53.0 03/20/2021 1652    Physical Exam:   VS:  BP 126/60    Pulse 63    Ht 5' 3.5" (1.613 m)    Wt 143 lb 6.4 oz (65 kg)    SpO2 98%    BMI 25.00 kg/m    Wt Readings from Last 3 Encounters:  04/13/21 143 lb 6.4 oz (65 kg)  03/28/21 137 lb (62.1 kg)  03/20/21 140 lb 6.4 oz (63.7 kg)    General: Well nourished, well developed, in no acute distress Head: Atraumatic, normal size  Eyes: PEERLA, EOMI  Neck: Supple, no JVD Endocrine: No thryomegaly Cardiac: Normal S1, S2; RRR; 3 out of 6 systolic ejection murmur, late peaking Lungs: Clear to auscultation bilaterally, no wheezing, rhonchi or rales  Abd: Soft, nontender, no hepatomegaly  Ext: No edema, pulses 2+ Musculoskeletal: No deformities, BUE and BLE strength normal and equal Skin: Warm and dry, no rashes   Neuro: Alert and oriented to person, place, time, and situation,  CNII-XII grossly intact, no focal deficits  Psych: Normal mood and affect   ASSESSMENT:   Harleigh Civello  Cheryl Cooper is a 81 y.o. female who presents for the following: 1. Nonrheumatic aortic valve stenosis   2. Mixed hyperlipidemia   3. Preoperative cardiovascular examination     PLAN:   1. Nonrheumatic aortic valve stenosis -Recent echo consistent with moderate aortic stenosis.  Murmur consistent with severe aortic stenosis.  I suspect she is on the fence.  Despite this she has no symptoms.  She will see me back in 6 months for repeat echocardiogram.  She is okay to proceed to surgery.  Denies any symptoms.  See discussion below.  2. Mixed hyperlipidemia -Well-controlled on Crestor.  No change to medications.  Diabetic foot her most recent LDL was 63 which is at goal.  3. Preoperative cardiovascular examination -RCRI equals 1 (stroke.  This equates to a 0.9% (low risk) risk of major perioperative cardiovascular event.  No history of CHF, MI, CKD, diabetes on insulin.  She can complete greater than 4 METS.  EKG in office is normal.  Moderate to severe aortic stenosis.  Safe to proceed with surgery given lack of symptoms and ability to complete greater than 4 METS.      Disposition: Return in about 6 months (around 10/11/2021).  Medication Adjustments/Labs and Tests Ordered: Current medicines are reviewed at length with the patient today.  Concerns regarding medicines are outlined above.  Orders Placed This Encounter  Procedures   EKG 12-Lead   ECHOCARDIOGRAM COMPLETE   No orders of the defined types were placed in this encounter.   Patient Instructions  Medication Instructions:  The current medical regimen is effective;  continue present plan and medications.  *If you need a refill on your cardiac medications before your next appointment, please call your pharmacy*   Testing/Procedures: Echocardiogram (6 month)  - Your physician has requested that you have an echocardiogram.  Echocardiography is a painless test that uses sound waves to create images of your heart. It provides your doctor with information about the size and shape of your heart and how well your hearts chambers and valves are working. This procedure takes approximately one hour. There are no restrictions for this procedure. This will be performed at either our [facility] location - [address], [place] location [facility] 2nd floor.    Follow-Up: At [facility], you and your health needs are our priority.  As part of our continuing mission to provide you with exceptional heart care, we have created designated Provider Care Teams.  These Care Teams include your primary Cardiologist (physician) and Advanced Practice Providers (APPs -  Physician Assistants and Nurse Practitioners) who all work together to provide you with the care you need, when you need it.  We recommend signing up for the patient portal called "MyChart".  Sign up information is provided on this After Visit Summary.  MyChart is used to connect with patients for Virtual Visits (Telemedicine).  Patients are able to view lab/test results, encounter notes, upcoming appointments, etc.  Non-urgent messages can be sent to your provider as well.   To learn more about what you can do with MyChart, go to [email or web address].    Your next appointment:   6 month(s)  The format for your next appointment:   In Person  Provider:   Eleonore Chiquito, MD       Time Spent with Patient: I have spent a total of 35 minutes with patient reviewing hospital notes, telemetry, EKGs, labs and examining the patient as well as establishing an assessment and  plan that was discussed with the patient.  > 50% of time was spent in direct patient care.  Signed, Addison Naegeli. Audie Box, MD, [place]  [address] [place], [place] [address] [phone]  04/13/2021 2:23 PM

## 2021-04-13 ENCOUNTER — Encounter: Payer: Self-pay | Admitting: Cardiovascular Disease

## 2021-04-13 ENCOUNTER — Ambulatory Visit: Payer: Medicare PPO | Admitting: Cardiovascular Disease

## 2021-04-13 ENCOUNTER — Other Ambulatory Visit: Payer: Self-pay

## 2021-04-13 VITALS — BP 126/60 | HR 63 | Ht 63.5 in | Wt 143.4 lb

## 2021-04-13 DIAGNOSIS — I350 Nonrheumatic aortic (valve) stenosis: Secondary | ICD-10-CM

## 2021-04-13 DIAGNOSIS — E782 Mixed hyperlipidemia: Secondary | ICD-10-CM | POA: Diagnosis not present

## 2021-04-13 DIAGNOSIS — Z01810 Encounter for preprocedural cardiovascular examination: Secondary | ICD-10-CM | POA: Diagnosis not present

## 2021-04-13 NOTE — Patient Instructions (Signed)
Medication Instructions:  The current medical regimen is effective;  continue present plan and medications.  *If you need a refill on your cardiac medications before your next appointment, please call your pharmacy*   Testing/Procedures: Echocardiogram (6 month)  - Your physician has requested that you have an echocardiogram. Echocardiography is a painless test that uses sound waves to create images of your heart. It provides your doctor with information about the size and shape of your heart and how well your hearts chambers and valves are working. This procedure takes approximately one hour. There are no restrictions for this procedure. This will be performed at either our [facility] location - [address] [place] location [facility] 2nd floor.    Follow-Up: At [facility], you and your health needs are our priority.  As part of our continuing mission to provide you with exceptional heart care, we have created designated Provider Care Teams.  These Care Teams include your primary Cardiologist (physician) and Advanced Practice Providers (APPs -  Physician Assistants and Nurse Practitioners) who all work together to provide you with the care you need, when you need it.  We recommend signing up for the patient portal called "MyChart".  Sign up information is provided on this After Visit Summary.  MyChart is used to connect with patients for Virtual Visits (Telemedicine).  Patients are able to view lab/test results, encounter notes, upcoming appointments, etc.  Non-urgent messages can be sent to your provider as well.   To learn more about what you can do with MyChart, go to [email or web address].    Your next appointment:   6 month(s)  The format for your next appointment:   In Person  Provider:   Eleonore Chiquito, MD

## 2021-04-16 NOTE — Anesthesia Preprocedure Evaluation (Addendum)
Anesthesia Evaluation  Patient identified by MRN, date of birth, ID band Patient awake    Reviewed: Allergy & Precautions, NPO status , Patient's Chart, lab work & pertinent test results  History of Anesthesia Complications (+) PONV and history of anesthetic complications  Airway Mallampati: III  TM Distance: >3 FB   Mouth opening: Limited Mouth Opening  Dental no notable dental hx.    Pulmonary    Pulmonary exam normal        Cardiovascular hypertension, Normal cardiovascular exam     Neuro/Psych CVA, No Residual Symptoms    GI/Hepatic PUD, GERD  Medicated and Controlled,  Endo/Other  diabetesHypothyroidism   Renal/GU      Musculoskeletal  (+) Arthritis , Osteoarthritis,    Abdominal Normal abdominal exam  (+)   Peds  Hematology   Anesthesia Other Findings CV: Echo 04/05/2021 1. Left ventricular diastolic parameters are consistent with Grade I  diastolic dysfunction (impaired relaxation). Elevated left ventricular  end-diastolic pressure. The E/e' is 8. The average left ventricular  global longitudinal strain is -23.4 %. The  global longitudinal strain is normal  2. Left ventricular ejection fraction, by estimation, is >75%. Left  ventricular ejection fraction by PLAX is 78 %. The left ventricle has  hyperdynamic function. The left ventricle has no regional wall motion  abnormalities. There is moderate left  ventricular hypertrophy of the basal-septal segment. There appears to be  some dynamic LVOT obstruction as well with increased LVOT gradient up to  54 mmHg associated with valsalva.  3. Right ventricular systolic function is normal. The right ventricular  size is normal. Tricuspid regurgitation signal is inadequate for assessing  PA pressure.  4. The mitral valve is abnormal. Trivial mitral valve regurgitation.  5. The aortic valve is tricuspid. There is moderate calcification of the  aortic valve.  Aortic valve regurgitation is trivial. Moderate aortic valve  stenosis. Aortic valve area, by VTI measures 1.21 cm. Aortic valve mean  gradient measures 34.0 mmHg.  Aortic valve Vmax measures 3.88 m/s. Sharma Covert is 0.32.  6. The inferior vena cava is normal in size with greater than 50%  respiratory variability, suggesting right atrial pressure of 3 mmHg.  7. Cannot exclude a small PFO.      Reproductive/Obstetrics                           Anesthesia Physical Anesthesia Plan  ASA: 3  Anesthesia Plan: General   Post-op Pain Management: Regional block*   Induction: Intravenous  PONV Risk Score and Plan: 4 or greater and Ondansetron, Dexamethasone, Midazolam and Treatment may vary due to age or medical condition  Airway Management Planned: Oral ETT  Additional Equipment: None  Intra-op Plan:   Post-operative Plan: Extubation in OR  Informed Consent: I have reviewed the patients History and Physical, chart, labs and discussed the procedure including the risks, benefits and alternatives for the proposed anesthesia with the patient or authorized representative who has indicated his/her understanding and acceptance.     Dental advisory given  Plan Discussed with: CRNA  Anesthesia Plan Comments: (See PAT note 03/28/2021, Konrad Felix Ward, PA-C)      Anesthesia Quick Evaluation

## 2021-04-18 ENCOUNTER — Other Ambulatory Visit: Payer: Self-pay

## 2021-04-18 ENCOUNTER — Encounter (HOSPITAL_COMMUNITY)
Admission: RE | Admit: 2021-04-18 | Discharge: 2021-04-18 | Disposition: A | Discharge status: Home / Self Care | Payer: Medicare PPO | Source: Ambulatory Visit | Attending: Orthopedic Surgery | Admitting: Orthopedic Surgery

## 2021-04-18 DIAGNOSIS — Z8673 Personal history of transient ischemic attack (TIA), and cerebral infarction without residual deficits: Secondary | ICD-10-CM | POA: Diagnosis not present

## 2021-04-18 DIAGNOSIS — G5601 Carpal tunnel syndrome, right upper limb: Secondary | ICD-10-CM | POA: Diagnosis not present

## 2021-04-18 DIAGNOSIS — E119 Type 2 diabetes mellitus without complications: Secondary | ICD-10-CM | POA: Diagnosis present

## 2021-04-18 DIAGNOSIS — Z96611 Presence of right artificial shoulder joint: Secondary | ICD-10-CM | POA: Diagnosis not present

## 2021-04-18 DIAGNOSIS — N3941 Urge incontinence: Secondary | ICD-10-CM | POA: Diagnosis not present

## 2021-04-18 DIAGNOSIS — G8918 Other acute postprocedural pain: Secondary | ICD-10-CM | POA: Diagnosis not present

## 2021-04-18 DIAGNOSIS — F32A Depression, unspecified: Secondary | ICD-10-CM | POA: Diagnosis present

## 2021-04-18 DIAGNOSIS — M12811 Other specific arthropathies, not elsewhere classified, right shoulder: Secondary | ICD-10-CM | POA: Diagnosis not present

## 2021-04-18 DIAGNOSIS — Z79899 Other long term (current) drug therapy: Secondary | ICD-10-CM | POA: Diagnosis not present

## 2021-04-18 DIAGNOSIS — R351 Nocturia: Secondary | ICD-10-CM | POA: Diagnosis not present

## 2021-04-18 DIAGNOSIS — K219 Gastro-esophageal reflux disease without esophagitis: Secondary | ICD-10-CM | POA: Diagnosis present

## 2021-04-18 DIAGNOSIS — I10 Essential (primary) hypertension: Secondary | ICD-10-CM | POA: Diagnosis present

## 2021-04-18 DIAGNOSIS — E039 Hypothyroidism, unspecified: Secondary | ICD-10-CM | POA: Diagnosis present

## 2021-04-18 DIAGNOSIS — Z96641 Presence of right artificial hip joint: Secondary | ICD-10-CM | POA: Diagnosis present

## 2021-04-18 DIAGNOSIS — E7800 Pure hypercholesterolemia, unspecified: Secondary | ICD-10-CM | POA: Diagnosis present

## 2021-04-18 DIAGNOSIS — H9192 Unspecified hearing loss, left ear: Secondary | ICD-10-CM | POA: Diagnosis present

## 2021-04-18 DIAGNOSIS — N3091 Cystitis, unspecified with hematuria: Secondary | ICD-10-CM | POA: Diagnosis not present

## 2021-04-18 DIAGNOSIS — M75101 Unspecified rotator cuff tear or rupture of right shoulder, not specified as traumatic: Secondary | ICD-10-CM | POA: Diagnosis present

## 2021-04-18 DIAGNOSIS — Z7983 Long term (current) use of bisphosphonates: Secondary | ICD-10-CM | POA: Diagnosis not present

## 2021-04-18 DIAGNOSIS — Z79890 Hormone replacement therapy (postmenopausal): Secondary | ICD-10-CM | POA: Diagnosis not present

## 2021-04-18 DIAGNOSIS — N952 Postmenopausal atrophic vaginitis: Secondary | ICD-10-CM | POA: Diagnosis not present

## 2021-04-18 DIAGNOSIS — Z90710 Acquired absence of both cervix and uterus: Secondary | ICD-10-CM | POA: Diagnosis not present

## 2021-04-18 DIAGNOSIS — M25511 Pain in right shoulder: Secondary | ICD-10-CM | POA: Diagnosis present

## 2021-04-18 DIAGNOSIS — Z885 Allergy status to narcotic agent status: Secondary | ICD-10-CM | POA: Diagnosis not present

## 2021-04-18 DIAGNOSIS — Z20822 Contact with and (suspected) exposure to covid-19: Secondary | ICD-10-CM | POA: Diagnosis present

## 2021-04-18 DIAGNOSIS — M19111 Post-traumatic osteoarthritis, right shoulder: Secondary | ICD-10-CM | POA: Diagnosis present

## 2021-04-18 DIAGNOSIS — M19011 Primary osteoarthritis, right shoulder: Secondary | ICD-10-CM | POA: Diagnosis not present

## 2021-04-18 DIAGNOSIS — Z888 Allergy status to other drugs, medicaments and biological substances status: Secondary | ICD-10-CM | POA: Diagnosis not present

## 2021-04-18 DIAGNOSIS — Z01812 Encounter for preprocedural laboratory examination: Secondary | ICD-10-CM | POA: Insufficient documentation

## 2021-04-18 DIAGNOSIS — Z01818 Encounter for other preprocedural examination: Secondary | ICD-10-CM

## 2021-04-18 DIAGNOSIS — F419 Anxiety disorder, unspecified: Secondary | ICD-10-CM | POA: Diagnosis present

## 2021-04-18 DIAGNOSIS — M75121 Complete rotator cuff tear or rupture of right shoulder, not specified as traumatic: Secondary | ICD-10-CM | POA: Diagnosis not present

## 2021-04-18 DIAGNOSIS — R350 Frequency of micturition: Secondary | ICD-10-CM | POA: Diagnosis not present

## 2021-04-18 DIAGNOSIS — M1990 Unspecified osteoarthritis, unspecified site: Secondary | ICD-10-CM | POA: Diagnosis present

## 2021-04-18 DIAGNOSIS — R3914 Feeling of incomplete bladder emptying: Secondary | ICD-10-CM | POA: Diagnosis not present

## 2021-04-18 LAB — SARS CORONAVIRUS 2 (TAT 6-24 HRS): SARS Coronavirus 2: NEGATIVE

## 2021-04-20 ENCOUNTER — Inpatient Hospital Stay (HOSPITAL_COMMUNITY): Payer: Medicare PPO | Admitting: Physician Assistant

## 2021-04-20 ENCOUNTER — Encounter (HOSPITAL_COMMUNITY)
Admission: RE | Disposition: A | Discharge status: Home / Self Care | Payer: Self-pay | Source: Home / Self Care | Attending: Orthopedic Surgery

## 2021-04-20 ENCOUNTER — Inpatient Hospital Stay (HOSPITAL_COMMUNITY)
Admission: RE | Admit: 2021-04-20 | Discharge: 2021-04-21 | DRG: 483 | Disposition: A | Discharge status: Home / Self Care | Payer: Medicare PPO | Attending: Orthopedic Surgery | Admitting: Orthopedic Surgery

## 2021-04-20 ENCOUNTER — Other Ambulatory Visit: Payer: Self-pay

## 2021-04-20 ENCOUNTER — Observation Stay (HOSPITAL_COMMUNITY): Payer: Medicare PPO

## 2021-04-20 ENCOUNTER — Encounter (HOSPITAL_COMMUNITY): Payer: Self-pay | Admitting: Orthopedic Surgery

## 2021-04-20 ENCOUNTER — Inpatient Hospital Stay (HOSPITAL_BASED_OUTPATIENT_CLINIC_OR_DEPARTMENT_OTHER): Payer: Medicare PPO | Admitting: Certified Registered"

## 2021-04-20 DIAGNOSIS — Z96611 Presence of right artificial shoulder joint: Secondary | ICD-10-CM

## 2021-04-20 DIAGNOSIS — Z01818 Encounter for other preprocedural examination: Secondary | ICD-10-CM

## 2021-04-20 DIAGNOSIS — Z20822 Contact with and (suspected) exposure to covid-19: Secondary | ICD-10-CM | POA: Diagnosis present

## 2021-04-20 DIAGNOSIS — Z79899 Other long term (current) drug therapy: Secondary | ICD-10-CM

## 2021-04-20 DIAGNOSIS — E7800 Pure hypercholesterolemia, unspecified: Secondary | ICD-10-CM | POA: Diagnosis present

## 2021-04-20 DIAGNOSIS — F32A Depression, unspecified: Secondary | ICD-10-CM | POA: Diagnosis present

## 2021-04-20 DIAGNOSIS — K219 Gastro-esophageal reflux disease without esophagitis: Secondary | ICD-10-CM | POA: Diagnosis present

## 2021-04-20 DIAGNOSIS — M19011 Primary osteoarthritis, right shoulder: Secondary | ICD-10-CM | POA: Diagnosis not present

## 2021-04-20 DIAGNOSIS — H9192 Unspecified hearing loss, left ear: Secondary | ICD-10-CM | POA: Diagnosis present

## 2021-04-20 DIAGNOSIS — Z888 Allergy status to other drugs, medicaments and biological substances status: Secondary | ICD-10-CM

## 2021-04-20 DIAGNOSIS — I10 Essential (primary) hypertension: Secondary | ICD-10-CM | POA: Diagnosis present

## 2021-04-20 DIAGNOSIS — M12811 Other specific arthropathies, not elsewhere classified, right shoulder: Secondary | ICD-10-CM

## 2021-04-20 DIAGNOSIS — Z8673 Personal history of transient ischemic attack (TIA), and cerebral infarction without residual deficits: Secondary | ICD-10-CM

## 2021-04-20 DIAGNOSIS — F419 Anxiety disorder, unspecified: Secondary | ICD-10-CM | POA: Diagnosis present

## 2021-04-20 DIAGNOSIS — E039 Hypothyroidism, unspecified: Secondary | ICD-10-CM | POA: Diagnosis present

## 2021-04-20 DIAGNOSIS — Z79890 Hormone replacement therapy (postmenopausal): Secondary | ICD-10-CM

## 2021-04-20 DIAGNOSIS — E119 Type 2 diabetes mellitus without complications: Secondary | ICD-10-CM | POA: Diagnosis present

## 2021-04-20 DIAGNOSIS — Z7983 Long term (current) use of bisphosphonates: Secondary | ICD-10-CM

## 2021-04-20 DIAGNOSIS — M75101 Unspecified rotator cuff tear or rupture of right shoulder, not specified as traumatic: Secondary | ICD-10-CM | POA: Diagnosis present

## 2021-04-20 DIAGNOSIS — M19111 Post-traumatic osteoarthritis, right shoulder: Principal | ICD-10-CM | POA: Diagnosis present

## 2021-04-20 DIAGNOSIS — M75121 Complete rotator cuff tear or rupture of right shoulder, not specified as traumatic: Secondary | ICD-10-CM | POA: Diagnosis not present

## 2021-04-20 DIAGNOSIS — Z90710 Acquired absence of both cervix and uterus: Secondary | ICD-10-CM

## 2021-04-20 DIAGNOSIS — M1990 Unspecified osteoarthritis, unspecified site: Secondary | ICD-10-CM | POA: Diagnosis present

## 2021-04-20 DIAGNOSIS — Z96641 Presence of right artificial hip joint: Secondary | ICD-10-CM | POA: Diagnosis present

## 2021-04-20 DIAGNOSIS — M25511 Pain in right shoulder: Secondary | ICD-10-CM | POA: Diagnosis present

## 2021-04-20 DIAGNOSIS — Z885 Allergy status to narcotic agent status: Secondary | ICD-10-CM | POA: Diagnosis not present

## 2021-04-20 HISTORY — PX: REVERSE SHOULDER ARTHROPLASTY: SHX5054

## 2021-04-20 LAB — GLUCOSE, CAPILLARY
Glucose-Capillary: 126 mg/dL — ABNORMAL HIGH (ref 70–99)
Glucose-Capillary: 137 mg/dL — ABNORMAL HIGH (ref 70–99)
Glucose-Capillary: 177 mg/dL — ABNORMAL HIGH (ref 70–99)
Glucose-Capillary: 172 mg/dL — ABNORMAL HIGH (ref 70–99)
Glucose-Capillary: 215 mg/dL — ABNORMAL HIGH (ref 70–99)

## 2021-04-20 LAB — TYPE AND SCREEN
ABO/RH(D): A POS
Antibody Screen: NEGATIVE

## 2021-04-20 SURGERY — ARTHROPLASTY, SHOULDER, TOTAL, REVERSE
Anesthesia: General | Site: Shoulder | Laterality: Right

## 2021-04-20 MED ORDER — BUPIVACAINE-EPINEPHRINE (PF) 0.25% -1:200000 IJ SOLN
INTRAMUSCULAR | Status: AC
  Filled 2021-04-20: qty 30

## 2021-04-20 MED ORDER — OXYBUTYNIN CHLORIDE ER 5 MG PO TB24
15.0000 mg | ORAL_TABLET | Freq: Every day | ORAL | Status: DC
  Administered 2021-04-20: 15 mg via ORAL
  Filled 2021-04-20: qty 3

## 2021-04-20 MED ORDER — SUCCINYLCHOLINE CHLORIDE 200 MG/10ML IV SOSY
PREFILLED_SYRINGE | INTRAVENOUS | Status: DC | PRN
  Administered 2021-04-20 (×2): 100 mg via INTRAVENOUS

## 2021-04-20 MED ORDER — DOCUSATE SODIUM 100 MG PO CAPS
100.0000 mg | ORAL_CAPSULE | Freq: Two times a day (BID) | ORAL | Status: DC
  Administered 2021-04-20 – 2021-04-21 (×2): 100 mg via ORAL
  Filled 2021-04-20 (×2): qty 1

## 2021-04-20 MED ORDER — TRAZODONE HCL 50 MG PO TABS
25.0000 mg | ORAL_TABLET | Freq: Every day | ORAL | Status: DC
  Administered 2021-04-20: 25 mg via ORAL
  Filled 2021-04-20: qty 1

## 2021-04-20 MED ORDER — BUPIVACAINE HCL (PF) 0.25 % IJ SOLN
INTRAMUSCULAR | Status: AC
  Filled 2021-04-20: qty 30

## 2021-04-20 MED ORDER — PROBIOTIC PO TBEC: DELAYED_RELEASE_TABLET | Freq: Every day | ORAL | Status: DC

## 2021-04-20 MED ORDER — LIDOCAINE HCL (PF) 2 % IJ SOLN
INTRAMUSCULAR | Status: AC
  Filled 2021-04-20: qty 5

## 2021-04-20 MED ORDER — MIDAZOLAM HCL 2 MG/2ML IJ SOLN
INTRAMUSCULAR | Status: DC | PRN
  Administered 2021-04-20 (×2): 1 mg via INTRAVENOUS

## 2021-04-20 MED ORDER — POLYETHYLENE GLYCOL 3350 17 G PO PACK: 17.0000 g | PACK | Freq: Every day | ORAL | Status: DC | PRN

## 2021-04-20 MED ORDER — ACETAMINOPHEN 10 MG/ML IV SOLN: 1000.0000 mg | Freq: Once | INTRAVENOUS | Status: DC | PRN

## 2021-04-20 MED ORDER — MIDAZOLAM HCL 2 MG/2ML IJ SOLN
INTRAMUSCULAR | Status: AC
  Filled 2021-04-20: qty 2

## 2021-04-20 MED ORDER — ROSUVASTATIN CALCIUM 10 MG PO TABS
10.0000 mg | ORAL_TABLET | Freq: Every day | ORAL | Status: DC
  Administered 2021-04-20 – 2021-04-21 (×2): 10 mg via ORAL
  Filled 2021-04-20 (×2): qty 1

## 2021-04-20 MED ORDER — DEXAMETHASONE SODIUM PHOSPHATE 10 MG/ML IJ SOLN
INTRAMUSCULAR | Status: DC | PRN
  Administered 2021-04-20: 5 mg via INTRAVENOUS

## 2021-04-20 MED ORDER — PANTOPRAZOLE SODIUM 40 MG PO TBEC
40.0000 mg | DELAYED_RELEASE_TABLET | Freq: Every day | ORAL | Status: DC
  Administered 2021-04-21: 40 mg via ORAL
  Filled 2021-04-20: qty 1

## 2021-04-20 MED ORDER — BUPIVACAINE-EPINEPHRINE (PF) 0.5% -1:200000 IJ SOLN
INTRAMUSCULAR | Status: DC | PRN
  Administered 2021-04-20 (×5): 3 mL via PERINEURAL

## 2021-04-20 MED ORDER — ROCURONIUM BROMIDE 10 MG/ML (PF) SYRINGE
PREFILLED_SYRINGE | INTRAVENOUS | Status: AC
  Filled 2021-04-20: qty 10

## 2021-04-20 MED ORDER — TRAMADOL HCL 50 MG PO TABS
50.0000 mg | ORAL_TABLET | Freq: Four times a day (QID) | ORAL | Status: DC | PRN
  Administered 2021-04-20 – 2021-04-21 (×2): 50 mg via ORAL
  Filled 2021-04-20 (×2): qty 1

## 2021-04-20 MED ORDER — LACTATED RINGERS IV SOLN: INTRAVENOUS | Status: DC

## 2021-04-20 MED ORDER — AMLODIPINE BESYLATE 5 MG PO TABS
7.5000 mg | ORAL_TABLET | Freq: Every day | ORAL | Status: DC
Start: 2021-04-21 — End: 2021-04-21
  Administered 2021-04-21: 7.5 mg via ORAL
  Filled 2021-04-20: qty 2

## 2021-04-20 MED ORDER — LIDOCAINE 2% (20 MG/ML) 5 ML SYRINGE
INTRAMUSCULAR | Status: DC | PRN
Start: 2021-04-20 — End: 2021-04-20
  Administered 2021-04-20: 60 mg via INTRAVENOUS

## 2021-04-20 MED ORDER — MORPHINE SULFATE (PF) 2 MG/ML IV SOLN: 0.5000 mg | INTRAVENOUS | Status: DC | PRN

## 2021-04-20 MED ORDER — ONDANSETRON HCL 4 MG/2ML IJ SOLN: 4.0000 mg | Freq: Four times a day (QID) | INTRAMUSCULAR | Status: DC | PRN

## 2021-04-20 MED ORDER — ONDANSETRON HCL 4 MG/2ML IJ SOLN
INTRAMUSCULAR | Status: DC | PRN
  Administered 2021-04-20: 4 mg via INTRAVENOUS

## 2021-04-20 MED ORDER — ONDANSETRON HCL 4 MG PO TABS: 4.0000 mg | ORAL_TABLET | Freq: Four times a day (QID) | ORAL | Status: DC | PRN

## 2021-04-20 MED ORDER — GINKGO BILOBA 40 MG PO CAPS: 40.0000 mg | ORAL_CAPSULE | Freq: Every day | ORAL | Status: DC

## 2021-04-20 MED ORDER — BUPIVACAINE-EPINEPHRINE (PF) 0.25% -1:200000 IJ SOLN
INTRAMUSCULAR | Status: DC | PRN
  Administered 2021-04-20: 10 mL

## 2021-04-20 MED ORDER — PHENYLEPHRINE HCL-NACL 20-0.9 MG/250ML-% IV SOLN
INTRAVENOUS | Status: DC | PRN
  Administered 2021-04-20: 50 ug/min via INTRAVENOUS

## 2021-04-20 MED ORDER — SODIUM CHLORIDE 0.9 % IR SOLN
Status: DC | PRN
  Administered 2021-04-20: 1000 mL

## 2021-04-20 MED ORDER — LEVOTHYROXINE SODIUM 75 MCG PO TABS
75.0000 ug | ORAL_TABLET | Freq: Every day | ORAL | Status: DC
  Administered 2021-04-21: 75 ug via ORAL
  Filled 2021-04-20: qty 1

## 2021-04-20 MED ORDER — IBUPROFEN 100 MG/5ML PO SUSP
200.0000 mg | Freq: Four times a day (QID) | ORAL | Status: DC | PRN
  Filled 2021-04-20: qty 20

## 2021-04-20 MED ORDER — TRAMADOL HCL 50 MG PO TABS: 50.0000 mg | ORAL_TABLET | Freq: Four times a day (QID) | ORAL | 0 refills | Status: DC | PRN

## 2021-04-20 MED ORDER — CEFAZOLIN SODIUM-DEXTROSE 1-4 GM/50ML-% IV SOLN
1.0000 g | Freq: Four times a day (QID) | INTRAVENOUS | Status: AC
  Administered 2021-04-20 – 2021-04-21 (×3): 1 g via INTRAVENOUS
  Filled 2021-04-20 (×3): qty 50

## 2021-04-20 MED ORDER — CENTRUM PO CHEW
1.0000 | CHEWABLE_TABLET | Freq: Every day | ORAL | Status: DC
Start: 2021-04-21 — End: 2021-04-21
  Filled 2021-04-20: qty 1

## 2021-04-20 MED ORDER — TETRAHYDROZOLINE HCL 0.05 % OP SOLN
1.0000 [drp] | Freq: Two times a day (BID) | OPHTHALMIC | Status: DC
Start: 2021-04-20 — End: 2021-04-21
  Administered 2021-04-20: 1 [drp] via OPHTHALMIC
  Filled 2021-04-20: qty 15

## 2021-04-20 MED ORDER — VITAMIN B-12 100 MCG PO TABS
100.0000 ug | ORAL_TABLET | Freq: Every day | ORAL | Status: DC
  Administered 2021-04-21: 100 ug via ORAL
  Filled 2021-04-20: qty 1

## 2021-04-20 MED ORDER — FENTANYL CITRATE (PF) 100 MCG/2ML IJ SOLN
INTRAMUSCULAR | Status: AC
  Filled 2021-04-20: qty 2

## 2021-04-20 MED ORDER — METOCLOPRAMIDE HCL 5 MG PO TABS: 5.0000 mg | ORAL_TABLET | Freq: Three times a day (TID) | ORAL | Status: DC | PRN

## 2021-04-20 MED ORDER — IBUPROFEN 200 MG PO TABS: 200.0000 mg | ORAL_TABLET | Freq: Four times a day (QID) | ORAL | Status: DC | PRN

## 2021-04-20 MED ORDER — BISACODYL 10 MG RE SUPP
10.0000 mg | Freq: Every day | RECTAL | Status: DC | PRN
Start: 2021-04-20 — End: 2021-04-21

## 2021-04-20 MED ORDER — FENTANYL CITRATE PF 50 MCG/ML IJ SOSY: 25.0000 ug | PREFILLED_SYRINGE | INTRAMUSCULAR | Status: DC | PRN

## 2021-04-20 MED ORDER — METHOCARBAMOL 1000 MG/10ML IJ SOLN
500.0000 mg | Freq: Four times a day (QID) | INTRAVENOUS | Status: DC | PRN
  Filled 2021-04-20: qty 5

## 2021-04-20 MED ORDER — CEFAZOLIN SODIUM-DEXTROSE 2-4 GM/100ML-% IV SOLN
2.0000 g | INTRAVENOUS | Status: AC
  Administered 2021-04-20: 2 g via INTRAVENOUS
  Filled 2021-04-20: qty 100

## 2021-04-20 MED ORDER — ORAL CARE MOUTH RINSE: 15.0000 mL | Freq: Once | OROMUCOSAL | Status: AC

## 2021-04-20 MED ORDER — PROPOFOL 10 MG/ML IV BOLUS
INTRAVENOUS | Status: DC | PRN
  Administered 2021-04-20: 120 mg via INTRAVENOUS
  Administered 2021-04-20: 80 mg via INTRAVENOUS

## 2021-04-20 MED ORDER — MENTHOL 3 MG MT LOZG: 1.0000 | LOZENGE | OROMUCOSAL | Status: DC | PRN

## 2021-04-20 MED ORDER — ACETAMINOPHEN 325 MG PO TABS: 325.0000 mg | ORAL_TABLET | Freq: Four times a day (QID) | ORAL | Status: DC | PRN

## 2021-04-20 MED ORDER — PHENYLEPHRINE HCL (PRESSORS) 10 MG/ML IV SOLN
INTRAVENOUS | Status: AC
  Filled 2021-04-20: qty 1

## 2021-04-20 MED ORDER — SUCCINYLCHOLINE CHLORIDE 200 MG/10ML IV SOSY
PREFILLED_SYRINGE | INTRAVENOUS | Status: AC
  Filled 2021-04-20: qty 10

## 2021-04-20 MED ORDER — ESCITALOPRAM OXALATE 20 MG PO TABS
20.0000 mg | ORAL_TABLET | Freq: Every day | ORAL | Status: DC
  Administered 2021-04-21: 20 mg via ORAL
  Filled 2021-04-20: qty 1

## 2021-04-20 MED ORDER — EPHEDRINE SULFATE-NACL 50-0.9 MG/10ML-% IV SOSY
PREFILLED_SYRINGE | INTRAVENOUS | Status: DC | PRN
Start: 2021-04-20 — End: 2021-04-20
  Administered 2021-04-20 (×2): 5 mg via INTRAVENOUS

## 2021-04-20 MED ORDER — EZETIMIBE 10 MG PO TABS
10.0000 mg | ORAL_TABLET | Freq: Every day | ORAL | Status: DC
  Administered 2021-04-20 – 2021-04-21 (×2): 10 mg via ORAL
  Filled 2021-04-20 (×2): qty 1

## 2021-04-20 MED ORDER — PROPOFOL 10 MG/ML IV BOLUS
INTRAVENOUS | Status: AC
  Filled 2021-04-20: qty 20

## 2021-04-20 MED ORDER — CHLORHEXIDINE GLUCONATE 0.12 % MT SOLN
15.0000 mL | Freq: Once | OROMUCOSAL | Status: AC
  Administered 2021-04-20: 15 mL via OROMUCOSAL

## 2021-04-20 MED ORDER — EPHEDRINE 5 MG/ML INJ
INTRAVENOUS | Status: AC
  Filled 2021-04-20: qty 5

## 2021-04-20 MED ORDER — METOCLOPRAMIDE HCL 5 MG/ML IJ SOLN: 5.0000 mg | Freq: Three times a day (TID) | INTRAMUSCULAR | Status: DC | PRN

## 2021-04-20 MED ORDER — SODIUM CHLORIDE 0.9 % IV SOLN: INTRAVENOUS | Status: DC

## 2021-04-20 MED ORDER — ONDANSETRON HCL 4 MG/2ML IJ SOLN: 4.0000 mg | Freq: Once | INTRAMUSCULAR | Status: DC | PRN

## 2021-04-20 MED ORDER — BUPIVACAINE LIPOSOME 1.3 % IJ SUSP
INTRAMUSCULAR | Status: DC | PRN
  Administered 2021-04-20 (×5): 2 mL via PERINEURAL

## 2021-04-20 MED ORDER — DEXAMETHASONE SODIUM PHOSPHATE 10 MG/ML IJ SOLN
INTRAMUSCULAR | Status: AC
  Filled 2021-04-20: qty 1

## 2021-04-20 MED ORDER — VITAMIN D 25 MCG (1000 UNIT) PO TABS
1000.0000 [IU] | ORAL_TABLET | Freq: Every day | ORAL | Status: DC
  Administered 2021-04-21: 1000 [IU] via ORAL
  Filled 2021-04-20 (×2): qty 1

## 2021-04-20 MED ORDER — HYDROCODONE-ACETAMINOPHEN 7.5-325 MG PO TABS: 1.0000 | ORAL_TABLET | Freq: Once | ORAL | Status: DC | PRN

## 2021-04-20 MED ORDER — FENTANYL CITRATE (PF) 250 MCG/5ML IJ SOLN
INTRAMUSCULAR | Status: DC | PRN
  Administered 2021-04-20 (×3): 50 ug via INTRAVENOUS

## 2021-04-20 MED ORDER — METHOCARBAMOL 500 MG PO TABS
500.0000 mg | ORAL_TABLET | Freq: Four times a day (QID) | ORAL | Status: DC | PRN
  Administered 2021-04-21: 500 mg via ORAL
  Filled 2021-04-20: qty 1

## 2021-04-20 MED ORDER — PHENOL 1.4 % MT LIQD: 1.0000 | OROMUCOSAL | Status: DC | PRN

## 2021-04-20 MED ORDER — ALENDRONATE SODIUM 70 MG PO TABS: 70.0000 mg | ORAL_TABLET | ORAL | Status: DC

## 2021-04-20 MED ORDER — ONDANSETRON HCL 4 MG/2ML IJ SOLN
INTRAMUSCULAR | Status: AC
  Filled 2021-04-20: qty 2

## 2021-04-20 SURGICAL SUPPLY — 70 items
BAG COUNTER SPONGE SURGICOUNT (BAG) IMPLANT
BAG ZIPLOCK 12X15 (MISCELLANEOUS) ×1 IMPLANT
BIT DRILL 1.6MX128 (BIT) IMPLANT
BIT DRILL 170X2.5X (BIT) IMPLANT
BIT DRL 170X2.5X (BIT) ×1
BLADE SAG 18X100X1.27 (BLADE) ×2 IMPLANT
COVER BACK TABLE 60X90IN (DRAPES) ×2 IMPLANT
COVER SURGICAL LIGHT HANDLE (MISCELLANEOUS) ×2 IMPLANT
DRAPE INCISE IOBAN 66X45 STRL (DRAPES) ×2 IMPLANT
DRAPE ORTHO SPLIT 77X108 STRL (DRAPES) ×2
DRAPE SHEET LG 3/4 BI-LAMINATE (DRAPES) ×2 IMPLANT
DRAPE SURG ORHT 6 SPLT 77X108 (DRAPES) ×2 IMPLANT
DRAPE TOP 10253 STERILE (DRAPES) ×2 IMPLANT
DRAPE U-SHAPE 47X51 STRL (DRAPES) ×2 IMPLANT
DRILL 2.5 (BIT) ×1
DRSG ADAPTIC 3X8 NADH LF (GAUZE/BANDAGES/DRESSINGS) ×2 IMPLANT
DRSG PAD ABDOMINAL 8X10 ST (GAUZE/BANDAGES/DRESSINGS) ×2 IMPLANT
DURAPREP 26ML APPLICATOR (WOUND CARE) ×2 IMPLANT
ECCENTRIC EPIPHYSI MODULAR SZ1 (Trauma) IMPLANT
ELECT BLADE TIP CTD 4 INCH (ELECTRODE) ×2 IMPLANT
ELECT NDL TIP 2.8 STRL (NEEDLE) ×1 IMPLANT
ELECT NEEDLE TIP 2.8 STRL (NEEDLE) ×2 IMPLANT
ELECT REM PT RETURN 15FT ADLT (MISCELLANEOUS) ×2 IMPLANT
FACESHIELD WRAPAROUND (MASK) ×8 IMPLANT
FACESHIELD WRAPAROUND OR TEAM (MASK) ×1 IMPLANT
GAUZE SPONGE 4X4 12PLY STRL (GAUZE/BANDAGES/DRESSINGS) ×2 IMPLANT
GLENOSPHERE DELTA XTEND LAT 38 (Miscellaneous) ×1 IMPLANT
GLOVE SURG ORTHO LTX SZ7.5 (GLOVE) ×2 IMPLANT
GLOVE SURG ORTHO LTX SZ8.5 (GLOVE) ×2 IMPLANT
GLOVE SURG UNDER POLY LF SZ7.5 (GLOVE) ×2 IMPLANT
GLOVE SURG UNDER POLY LF SZ8.5 (GLOVE) ×2 IMPLANT
GOWN STRL REUS W/TWL XL LVL3 (GOWN DISPOSABLE) ×4 IMPLANT
KIT BASIN OR (CUSTOM PROCEDURE TRAY) ×2 IMPLANT
KIT TURNOVER KIT A (KITS) IMPLANT
MANIFOLD NEPTUNE II (INSTRUMENTS) ×2 IMPLANT
METAGLENE DELTA EXTEND (Trauma) IMPLANT
METAGLENE DXTEND (Trauma) ×2 IMPLANT
MODULAR ECCENTRIC EPIPHYSI SZ1 (Trauma) ×2 IMPLANT
NDL MAYO CATGUT SZ4 TPR NDL (NEEDLE) IMPLANT
NEEDLE MAYO CATGUT SZ4 (NEEDLE) IMPLANT
NS IRRIG 1000ML POUR BTL (IV SOLUTION) ×2 IMPLANT
PACK SHOULDER (CUSTOM PROCEDURE TRAY) ×2 IMPLANT
PIN GUIDE 1.2 (PIN) ×1 IMPLANT
PIN GUIDE GLENOPHERE 1.5MX300M (PIN) ×1 IMPLANT
PIN METAGLENE 2.5 (PIN) ×1 IMPLANT
PROTECTOR NERVE ULNAR (MISCELLANEOUS) ×2 IMPLANT
RESTRAINT HEAD UNIVERSAL NS (MISCELLANEOUS) ×2 IMPLANT
SCREW 4.5X36MM (Screw) ×1 IMPLANT
SCREW LOCK 42 (Screw) ×1 IMPLANT
SLING ARM FOAM STRAP LRG (SOFTGOODS) IMPLANT
SLING ARM FOAM STRAP MED (SOFTGOODS) ×1 IMPLANT
SMARTMIX MINI TOWER (MISCELLANEOUS)
SPACER 38 PLUS 3 (Spacer) ×1 IMPLANT
SPIKE FLUID TRANSFER (MISCELLANEOUS) ×2 IMPLANT
SPONGE T-LAP 18X18 ~~LOC~~+RFID (SPONGE) ×2 IMPLANT
SPONGE T-LAP 4X18 ~~LOC~~+RFID (SPONGE) ×2 IMPLANT
STEM DELTA DIA 10 HA (Stem) ×1 IMPLANT
STRIP CLOSURE SKIN 1/2X4 (GAUZE/BANDAGES/DRESSINGS) ×2 IMPLANT
SUCTION FRAZIER HANDLE 10FR (MISCELLANEOUS) ×1
SUCTION TUBE FRAZIER 10FR DISP (MISCELLANEOUS) ×1 IMPLANT
SUT FIBERWIRE #2 38 T-5 BLUE (SUTURE) ×4
SUT MNCRL AB 4-0 PS2 18 (SUTURE) ×2 IMPLANT
SUT VIC AB 0 CT1 36 (SUTURE) ×4 IMPLANT
SUT VIC AB 0 CT2 27 (SUTURE) ×2 IMPLANT
SUT VIC AB 2-0 CT1 27 (SUTURE) ×2
SUT VIC AB 2-0 CT1 TAPERPNT 27 (SUTURE) ×1 IMPLANT
SUTURE FIBERWR #2 38 T-5 BLUE (SUTURE) ×2 IMPLANT
TAPE CLOTH SURG 6X10 WHT LF (GAUZE/BANDAGES/DRESSINGS) ×1 IMPLANT
TOWEL OR 17X26 10 PK STRL BLUE (TOWEL DISPOSABLE) ×2 IMPLANT
TOWER SMARTMIX MINI (MISCELLANEOUS) IMPLANT

## 2021-04-20 NOTE — Brief Op Note (Signed)
04/20/2021 ? ?8:53 AM ? ?PATIENT:  Cheryl Cooper  81 y.o. female ? ?PRE-OPERATIVE DIAGNOSIS:  right shoulder cuff tear arthropathy ? ?POST-OPERATIVE DIAGNOSIS:  right shoulder cuff tear arthropathy ? ?PROCEDURE:  Procedure(s) with comments: ?REVERSE SHOULDER ARTHROPLASTY (Right) - with ISB DePuy Delta Xtend with NO subscap repair ? ?SURGEON:  Surgeon(s) and Role: ?   Netta Cedars, MD - Primary ? ?PHYSICIAN ASSISTANT:  ? ?ASSISTANTS: Ventura Bruns, PA-C  ? ?ANESTHESIA:   regional and general ? ?EBL:  100 mL  ? ?BLOOD ADMINISTERED:none ? ?DRAINS: none  ? ?LOCAL MEDICATIONS USED:  MARCAINE    ? ?SPECIMEN:  No Specimen ? ?DISPOSITION OF SPECIMEN:  N/A ? ?COUNTS:  YES ? ?TOURNIQUET:  * No tourniquets in log * ? ?DICTATION: .Other Dictation: Dictation Number [id number] ? ?PLAN OF CARE: Admit for overnight observation ? ?PATIENT DISPOSITION:  PACU - hemodynamically stable. ?  ?Delay start of Pharmacological VTE agent (>24hrs) due to surgical blood loss or risk of bleeding: not applicable ? ?

## 2021-04-20 NOTE — Op Note (Signed)
NAME: Cheryl Cooper, PENNINGER S. ?MEDICAL RECORD NO: [id number] ?ACCOUNT NO: [id number] ?DATE OF BIRTH: [birth year] ?FACILITY: [facility] ?LOCATION: [facility] ?PHYSICIAN: Doran Heater. Veverly Fells, MD ? ?Operative Report  ? ?DATE OF PROCEDURE: 04/20/2021 ? ?PREOPERATIVE DIAGNOSES:  Right shoulder rotator cuff tear arthropathy. ? ?POSTOPERATIVE DIAGNOSES:  Right shoulder rotator cuff tear arthropathy. ? ?PROCEDURE PERFORMED:  Right reverse shoulder placement using DePuy Delta Xtend prosthesis with no subscapularis repair. ? ?ATTENDING SURGEON:  Doran Heater. Veverly Fells, MD. ? ?ASSISTANT:  Darol Destine, [place], who was scrubbed during the entire procedure, and necessary for satisfactory completion of surgery, general anesthesia was used plus interscalene block. ? ?ESTIMATED BLOOD LOSS:  100 mL ? ?FLUID REPLACEMENT:  1500 mL crystalloid. ? ?Instrument counts were correct.  No complications.  Perioperative antibiotics were given. ? ?INDICATIONS:  The patient is a 81 year old female with a history of worsening right shoulder pain and dysfunction after failed rotator cuff surgery.  The patient has rotator cuff tear arthropathy and has had fixed the superior head migration for years.   ?Due to declining function and worsening pain we discussed options and agreed to make sure he had reverse shoulder placement to restore fixed focal mechanics to the shoulder and improve quality of life.  The patient agreed with the plan.  Informed consent ? obtained. ? ?DESCRIPTION OF PROCEDURE:  After an adequate level of general anesthesia was achieved, the patient was positioned in the modified beach chair position.  Right shoulder correctly identified and sterilely prepped and draped in the usual manner.  Timeout  ?called, verifying correct patient, correct site, we entered the patient's shoulder using standard deltopectoral approach, starting at the coracoid process extending down the anterior humerus.  Dissection down through subcutaneous tissues using Bovie.  We ? went  ahead and identified the cephalic vein and took that out laterally with the deltoid pectoralis taken medially.  Conjoined tendon identified and retracted medially.  Deep retractors were placed.  We tenodesed the biceps in situ with 0 Vicryl  ?figure-of-eight suture x2.  We then released the subscapularis remnant and tagged for protection of the axillary nerve.  We released the inferior capsule progressively externally rotating and delivering the humerus out of the wound.  The humerus was  ?devoid of any significant rotator cuff superiorly.  There was a little bit of teres minor on the back.  We placed our 6 mm reamer with the mallet and then we reamed up to a size 10.  We then placed our 10 mm T handle guide and resected the head at 20  ?degrees of retroversion at 155 degrees.  We removed excess osteophytes and also hardware that the patient had in from the prior rotator cuff surgery.  There were 3 metal anchors, Mitek anchors and then several PEEK type anchors that were also retrieved.  ?We removed as much suture as we possibly could.  At this point, with the osteophytes removed and the hardware removed we subluxed the humerus posteriorly.  We had good exposure of the glenoid face.  We removed the glenoid labrum and the capsule.  Once we ? had adequate exposure and removed all the cartilage from the face of the glenoid we then placed our center guide pin low on the glenoid face.  We reamed for the metaglene baseplate down to good stable bone.  We then did our peripheral hand reaming drill ? at our central peg hole and impacted the metaglene into position.  We had good baseplate stability.  We went ahead and placed a  42 screw inferiorly and a 36 superiorly at the base of the coracoid locking both.  Next, we secured, a 38 standard  ?glenosphere to the baseplate.  Once we did that, I did a finger sweeper around the backside of the glenosphere to make sure there was no soft tissue caught up between the glenosphere and  baseplate.  Once that was ensured we checked the axillary nerve was ? in good shape.  We then went ahead and went to the humeral side, we reamed for the one right metaphysis and then impacted the trial 10 stem with one right set on the 0 setting and impacted in 20 degrees of retroversion.  We then used a 38+3 poly trial  ?on the humeral tray, reduced the shoulder, had excellent stability and excellent range of motion.  We retrieved all the trial components from the humerus.  We irrigated thoroughly.  We selected the HA coated press fit, 10 stem with #1 right metaphysis  ?set on the 0 setting and using available bone graft we used impaction grafting technique and impacted the real press-fit stem into position set on 20 degrees of retroversion.  The stem was stable.  We went ahead and selected the real 38+3 poly and  ?impacted down the humeral tray and reduced the shoulder. Again appropriate soft tissue balancing with a appropriately tight conjoined axillary nerve not under too much pressure and then good stability in all ranges of motion.  We irrigated thoroughly  ?went ahead and removed our traction sutures from the subscap remnant and then repaired deltopectoral interval with 0 Vicryl suture followed by 2-0 Vicryl for subcutaneous closure and 4-0 Monocryl for skin.  Steri-Strips were applied followed by sterile  ?dressing.  The patient tolerated surgery well. ? ? ?PUS ?D: 04/20/2021 8:59:43 am T: 04/20/2021 11:45:00 am  ?JOB: [id number]/ [id number]  ?

## 2021-04-20 NOTE — Discharge Instructions (Signed)
Ice to the shoulder constantly.  Keep the incision covered and clean and dry for one week, then ok to get it wet in the shower. ° °Do exercise as instructed several times per day. ° °DO NOT reach behind your back or push up out of a chair with the operative arm. ° °Use a sling while you are up and around for comfort, may remove while seated.  Keep pillow propped behind the operative elbow. ° °Follow up with Dr Tonisha Silvey in two weeks in the office, call [phone] for appt °

## 2021-04-20 NOTE — Transfer of Care (Signed)
Immediate Anesthesia Transfer of Care Note ? ?Patient: Cheryl Cooper ? ?Procedure(s) Performed: REVERSE SHOULDER ARTHROPLASTY (Right: Shoulder) ? ?Patient Location: PACU ? ?Anesthesia Type:GA combined with regional for post-op pain ? ?Level of Consciousness: awake, drowsy and responds to stimulation ? ?Airway & Oxygen Therapy: Patient Spontanous Breathing, Patient connected to face mask oxygen and encouraged patient to take a deep breath. ? ?Post-op Assessment: Report given to RN and Post -op Vital signs reviewed and stable ? ?Post vital signs: Reviewed and stable ? ?Last Vitals:  ?Vitals Value Taken Time  ?BP 120/65 04/20/21 0910  ?Temp    ?Pulse 82 04/20/21 0913  ?Resp 15 04/20/21 0913  ?SpO2 92 % 04/20/21 0913  ?Vitals shown include unvalidated device data. ? ?Last Pain:  ?Vitals:  ? 04/20/21 0619  ?TempSrc:   ?PainSc: 9   ?   ? ?Patients Stated Pain Goal: 8 (04/20/21 5852) ? ?Complications: No notable events documented. ?

## 2021-04-20 NOTE — Anesthesia Procedure Notes (Signed)
Anesthesia Regional Block: Interscalene brachial plexus block  ? ?Pre-Anesthetic Checklist: , timeout performed,  Correct Patient, Correct Site, Correct Laterality,  Correct Procedure, Correct Position, site marked,  Risks and benefits discussed,  Surgical consent,  Pre-op evaluation,  At surgeon's request and post-op pain management ? ?Laterality: Right and Upper ? ?Prep: chloraprep     ?  ?Needles:  ?Injection technique: Single-shot ? ?Needle Type: Echogenic Stimulator Needle   ? ? ?Needle Length: 9cm  ?Needle Gauge: 20  ? ? ? ?Additional Needles: ? ? ?Procedures:,,,, ultrasound used (permanent image in chart),,    ?Narrative:  ?Start time: 04/20/2021 6:55 AM ?End time: 04/20/2021 7:15 AM ?Injection made incrementally with aspirations every 5 mL. ? ?Performed by: Personally  ?Anesthesiologist: Lyn Hollingshead, MD ? ? ? ? ?

## 2021-04-20 NOTE — Anesthesia Procedure Notes (Signed)
Procedure Name: Intubation ?Date/Time: 04/20/2021 7:29 AM ?Performed by: Lollie Sails, CRNA ?Pre-anesthesia Checklist: Patient identified, Emergency Drugs available, Suction available, Patient being monitored and Timeout performed ?Patient Re-evaluated:Patient Re-evaluated prior to induction ?Oxygen Delivery Method: Circle system utilized ?Preoxygenation: Pre-oxygenation with 100% oxygen ?Induction Type: IV induction ?Ventilation: Mask ventilation without difficulty ?Laryngoscope Size: Glidescope and 3 ?Grade View: Grade II ?Tube type: Oral ?Tube size: 7.0 mm ?Number of attempts: 1 ?Airway Equipment and Method: Rigid stylet and Video-laryngoscopy ?Placement Confirmation: ETT inserted through vocal cords under direct vision, positive ETCO2 and breath sounds checked- equal and bilateral ?Secured at: 22 cm ?Tube secured with: Tape ?Dental Injury: Teeth and Oropharynx as per pre-operative assessment  ?Difficulty Due To: Difficult Airway- due to limited oral opening and Difficulty was anticipated ?Future Recommendations: Recommend- induction with short-acting agent, and alternative techniques readily available ? ? ? ? ?

## 2021-04-20 NOTE — Interval H&P Note (Signed)
History and Physical Interval Note: ? ?04/20/2021 ?6:59 AM ? ?Cheryl Cooper  has presented today for surgery, with the diagnosis of right shoulder cuff arthropathy.  The various methods of treatment have been discussed with the patient and family. After consideration of risks, benefits and other options for treatment, the patient has consented to  Procedure(s) with comments: ?REVERSE SHOULDER ARTHROPLASTY (Right) - with ISB as a surgical intervention.  The patient's history has been reviewed, patient examined, no change in status, stable for surgery.  I have reviewed the patient's chart and labs.  Questions were answered to the patient's satisfaction.   ? ? ?Augustin Schooling ? ? ?

## 2021-04-20 NOTE — Anesthesia Postprocedure Evaluation (Signed)
Anesthesia Post Note ? ?Patient: Cheryl Cooper ? ?Procedure(s) Performed: REVERSE SHOULDER ARTHROPLASTY (Right: Shoulder) ? ?  ? ?Patient location during evaluation: PACU ?Anesthesia Type: General ?Level of consciousness: awake ?Pain management: pain level controlled ?Vital Signs Assessment: post-procedure vital signs reviewed and stable ?Respiratory status: spontaneous breathing ?Cardiovascular status: stable ?Postop Assessment: no apparent nausea or vomiting ?Anesthetic complications: no ? ? ?No notable events documented. ? ?Last Vitals:  ?Vitals:  ? 04/20/21 0930 04/20/21 0945  ?BP: 101/70 118/78  ?Pulse: 79 75  ?Resp: 20 20  ?Temp:  36.7 ?C  ?SpO2: 90% 91%  ?  ?Last Pain:  ?Vitals:  ? 04/20/21 0945  ?TempSrc:   ?PainSc: 0-No pain  ? ? ?  ?  ?  ?  ?  ?  ? ?Huston Foley ? ? ? ? ?

## 2021-04-20 NOTE — Plan of Care (Signed)
  Problem: Education: Goal: Knowledge of General Education information will improve Description: Including pain rating scale, medication(s)/side effects and non-pharmacologic comfort measures Outcome: Progressing   Problem: Activity: Goal: Risk for activity intolerance will decrease Outcome: Progressing   Problem: Pain Managment: Goal: General experience of comfort will improve Outcome: Progressing   

## 2021-04-21 LAB — BASIC METABOLIC PANEL
Anion gap: 6 (ref 5–15)
BUN: 12 mg/dL (ref 8–23)
CO2: 24 mmol/L (ref 22–32)
Calcium: 8.3 mg/dL — ABNORMAL LOW (ref 8.9–10.3)
Chloride: 102 mmol/L (ref 98–111)
Creatinine, Ser: 0.85 mg/dL (ref 0.44–1.00)
GFR, Estimated: >60 mL/min (ref >60)
Glucose, Bld: 137 mg/dL — ABNORMAL HIGH (ref 70–99)
Potassium: 3.8 mmol/L (ref 3.5–5.1)
Sodium: 132 mmol/L — ABNORMAL LOW (ref 135–145)

## 2021-04-21 LAB — GLUCOSE, CAPILLARY: Glucose-Capillary: 126 mg/dL — ABNORMAL HIGH (ref 70–99)

## 2021-04-21 LAB — HEMOGLOBIN AND HEMATOCRIT, BLOOD
HCT: 35.6 % — ABNORMAL LOW (ref 36.0–46.0)
Hemoglobin: 11.6 g/dL — ABNORMAL LOW (ref 12.0–15.0)

## 2021-04-21 NOTE — Plan of Care (Signed)
  Problem: Education: Goal: Knowledge of General Education information will improve Description: Including pain rating scale, medication(s)/side effects and non-pharmacologic comfort measures Outcome: Progressing   Problem: Activity: Goal: Risk for activity intolerance will decrease Outcome: Progressing   Problem: Pain Managment: Goal: General experience of comfort will improve Outcome: Progressing   

## 2021-04-21 NOTE — Progress Notes (Signed)
? ? ?  Subjective: ? ?Patient reports pain as mild to moderate.  Denies N/V/CP/SOB. No c/o. ? ?Objective:  ? ?VITALS:   ?Vitals:  ? 04/20/21 1656 04/20/21 2112 04/21/21 0257 04/21/21 0534  ?BP: (!) 154/74 129/75 133/76 (!) 156/75  ?Pulse: 84 77 78 86  ?Resp: $Remov'18 16 16 14  'DuKBrg$ ?Temp:  98.1 ?F (36.7 ?C) 98.8 ?F (37.1 ?C) 98.6 ?F (37 ?C)  ?TempSrc:  Oral Oral Oral  ?SpO2: 92% 91% 94% 92%  ?Weight:      ?Height:      ? ? ?NAD ?Sensation intact distally ?Intact pulses distally ?Incision: dressing C/D/I ?Sling intact ?Dressing changed - incis c/d/I ? ? ?Lab Results  ?Component Value Date  ? WBC 8.7 03/28/2021  ? HGB 11.6 (L) 04/21/2021  ? HCT 35.6 (L) 04/21/2021  ? MCV 89.5 03/28/2021  ? PLT 203 03/28/2021  ? ?BMET ?   ?Component Value Date/Time  ? NA 132 (L) 04/21/2021 0319  ? NA 137 05/03/2020 1051  ? K 3.8 04/21/2021 0319  ? CL 102 04/21/2021 0319  ? CO2 24 04/21/2021 0319  ? GLUCOSE 137 (H) 04/21/2021 0319  ? BUN 12 04/21/2021 0319  ? BUN 18 05/03/2020 1051  ? CREATININE 0.85 04/21/2021 0319  ? CALCIUM 8.3 (L) 04/21/2021 0319  ? EGFR 65 05/03/2020 1051  ? GFRNONAA >60 04/21/2021 0319  ? ? ? ?Assessment/Plan: ?1 Day Post-Op  ? ?Principal Problem: ?  S/P shoulder replacement, right ? ? ?NWB RUE, sling at all times ?DVT ppx: early ambulation, SCDs, TEDS ?PO pain control ?PT/OT ?Dispo: d/c home after clears OT ? ? ?Cheryl Cooper ?04/21/2021, 8:44 AM ? ? ?Rod Can, MD ?([phone] ?[facility]  [place] ?[address], [address], [place], [place] [address] ?Phone: [phone] ?[email or web address] ?Facebook  Engineer, structural  ?  ? ? ?

## 2021-04-21 NOTE — Evaluation (Addendum)
Occupational Therapy Evaluation Patient Details Name: Cheryl Cooper MRN: [id number] DOB: 11-04-1940 Today's Date: 04/21/2021   History of Present Illness Patient is a 81 year old female s/p R reverse total shoulder replacement. 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 Therapist provided education and instruction to patient in regards to exercises, precautions, positioning, donning upper extremity clothing and bathing while maintaining shoulder precautions, ice and edema management and donning/doffing sling. Patient reported she did no want to wait for family to come later on and that she had L shoulder surgery a few years prior with similar restrictions. Patient verbalized understanding and demonstrated as needed. Patient needed assistance to donn shirt, underwear, pants, socks and shoes and provided with instruction on compensatory strategies to perform ADLs. Patient to follow up with MD for further therapy needs.        Recommendations for follow up therapy are one component of a multi-disciplinary discharge planning process, led by the attending physician.  Recommendations may be updated based on patient status, additional functional criteria and insurance authorization.   Follow Up Recommendations  Follow physician's recommendations for discharge plan and follow up therapies    Assistance Recommended at Discharge Frequent or constant Supervision/Assistance  Patient can return home with the following A little help with walking and/or transfers;A little help with bathing/dressing/bathroom;Assistance with cooking/housework;Direct supervision/assist for financial management;Assist for transportation;Direct supervision/assist for medications management;Help with stairs or ramp  for entrance    Functional Status Assessment  Patient has had a recent decline in their functional status and demonstrates the ability to make significant improvements in function in a reasonable and predictable amount of time.  Equipment Recommendations  None recommended by OT    Recommendations for Other Services       Precautions / Restrictions Precautions Precautions: Shoulder Type of Shoulder Precautions: NO ROM shoulder, OK for elbow hand and wrist Shoulder Interventions: At all times;Off for dressing/bathing/exercises;Shoulder sling/immobilizer Precaution Booklet Issued: Yes (comment) (hand out) Required Braces or Orthoses: Sling Restrictions Weight Bearing Restrictions: Yes RUE Weight Bearing: Non weight bearing      Mobility Bed Mobility Overal bed mobility: Needs Assistance Bed Mobility: Supine to Sit     Supine to sit: Min assist     General bed mobility comments: with education on rolling to side and pushing through LUE. patient indicated she would sleep in recliner at home    Transfers                          Balance Overall balance assessment: Needs assistance Sitting-balance support: No upper extremity supported, Feet supported Sitting balance-Leahy Scale: Fair Sitting balance - Comments: after inital sitting on edge of bed.   Standing balance support: During functional activity, No upper extremity supported Standing balance-Leahy Scale: Fair Standing balance comment: with noted unsteadiness with dynamic balance patient reported this is her baseline                           ADL either performed or assessed with clinical judgement   ADL Overall ADL's : Needs assistance/impaired Eating/Feeding: Set up;Sitting Eating/Feeding Details (indicate cue type and reason): in recliner Grooming: Wash/dry face;Wash/dry hands;Standing Grooming Details (indicate cue type and reason): at sink O2 was noed to [employer]  between 88-92% on RA during  session with coughing noted. on call Ortho MD informed when stopped in room. Upper Body Bathing: Moderate assistance;Sitting   Lower Body Bathing: Sit to/from stand;Sitting/lateral leans;Maximal assistance   Upper Body Dressing : Moderate assistance;Sitting Upper Body Dressing Details (indicate cue type and reason): in recliner with cues to sequence task for ROM restrictions Lower Body Dressing: Moderate assistance;Sit to/from stand;Sitting/lateral leans Lower Body Dressing Details (indicate cue type and reason): patient needed physical assist to pull pants up with patient declining to take gripper socks off first. suspect that without gripper socks in place paient would need less assistance. Toilet Transfer: Min guard;Ambulation;BSC/3in1 Armed forces technical officer Details (indicate cue type and reason): patient was noed to have unsteadiness with functional mobility in room with out AD. patient attempted to use quad cane with patient unable to sequence task with patient holding in the air for majority of time when attempting to use despite education on sequencing and consistent cues. patient reported unsteadiness is baseline for her as well as furniture walking at home. patient was educated on importance of safety at home. patient verbalized understanding and reported husband would be present. Toileting- Clothing Manipulation and Hygiene: Minimal assistance;Sit to/from stand Toileting - Clothing Manipulation Details (indicate cue type and reason): to pull pants up on R side last 2 inches.     Functional mobility during ADLs: Min guard General ADL Comments: in hallway with no AD with patient holding LUE out to attempt to furniture walk multiple times in room was less in hallway with O2 maintaining 88-92% on RA with noted coughing and cues for deep breathing.     Vision Patient Visual Report: No change from baseline Additional Comments: has glasses for reading     Perception     Praxis      Pertinent  Vitals/Pain Pain Assessment Pain Assessment: 0-10 Pain Score: 3  Pain Location: R shoulder and thumb Pain Descriptors / Indicators: Dull, Aching Pain Intervention(s): Monitored during session, Premedicated before session     Hand Dominance Right   Extremity/Trunk Assessment Upper Extremity Assessment Upper Extremity Assessment: RUE deficits/detail RUE Deficits / Details: R TSA with no shoulder movement allowed. able to wigle digits has carpal tunnel syndrome in this wrist.           Communication Communication Communication: No difficulties   Cognition Arousal/Alertness: Awake/alert Behavior During Therapy: WFL for tasks assessed/performed Overall Cognitive Status: Within Functional Limits for tasks assessed                                       General Comments       Exercises     Shoulder Instructions Shoulder Instructions Donning/doffing shirt without moving shoulder: Supervision/safety;Patient able to independently direct caregiver Method for sponge bathing under operated UE: Minimal assistance;Patient able to independently direct caregiver Donning/doffing sling/immobilizer: Patient able to independently direct caregiver;Moderate assistance Correct positioning of sling/immobilizer: Patient able to independently direct caregiver;Minimal assistance ROM for elbow, wrist and digits of operated UE: Patient able to independently direct caregiver;Supervision/safety (unable to complete today for elbow but able to complete hand. eduated that if wrist movements irritated wrist then to do what she could tolerate.) Sling wearing schedule (on at all times/off for ADL's): Patient able to independently direct caregiver;Modified independent Proper positioning of operated UE when showering: Supervision/safety;Patient able to independently direct caregiver Positioning of UE while sleeping: Supervision/safety;Patient able to independently direct caregiver  Home Living  Family/patient expects to be discharged to:: Private residence Living Arrangements: Spouse/significant other Available Help at Discharge: Family;Available 24 hours/day;Available PRN/intermittently Type of Home: House Home Access: Stairs to enter Entrance Stairs-Number of Steps: 2 Entrance Stairs-Rails: Right Home Layout: One level     Bathroom Shower/Tub: Walk-in shower         Home Equipment: Conservation officer, nature (2 wheels);Rollator (4 wheels);BSC/3in1;Shower seat          Prior Functioning/Environment Prior Level of Function : Independent/Modified Independent                        OT Problem List: Impaired balance (sitting and/or standing);Decreased safety awareness;Pain;Impaired UE functional use;Cardiopulmonary status limiting activity;Decreased knowledge of precautions;Decreased knowledge of use of DME or AE      OT Treatment/Interventions:      OT Goals(Current goals can be found in the care plan section) Acute Rehab OT Goals Patient Stated Goal: to go home OT Goal Formulation: With patient Time For Goal Achievement: 05/05/21 Potential to Achieve Goals: Good  OT Frequency:      Co-evaluation              AM-PAC OT "6 Clicks" Daily Activity     Outcome Measure Help from another person eating meals?: A Little Help from another person taking care of personal grooming?: A Little Help from another person toileting, which includes using toliet, bedpan, or urinal?: A Little Help from another person bathing (including washing, rinsing, drying)?: A Little Help from another person to put on and taking off regular upper body clothing?: A Little Help from another person to put on and taking off regular lower body clothing?: A Lot 6 Click Score: 17   End of Session Equipment Utilized During Treatment: Gait belt Nurse Communication: Other (comment);Mobility status (O2 during session and baseline instability.)  Activity Tolerance: Patient tolerated treatment  well Patient left: in chair;with call bell/phone within reach;with chair alarm set  OT Visit Diagnosis: Unsteadiness on feet (R26.81);Other abnormalities of gait and mobility (R26.89);Pain                Time: [phone] (minus 7 to get canes for patient to try) OT Time Calculation (min): 72 min Charges:  OT General Charges $OT Visit: 1 Visit OT Evaluation $OT Eval Moderate Complexity: 1 Mod OT Treatments $Self Care/Home Management : 38-52 mins  Jackelyn Poling OTR/L, MS Acute Rehabilitation Department Office# [phone] Pager# [phone]   Marcellina Millin 04/21/2021, 9:33 AM

## 2021-04-21 NOTE — TOC Transition Note (Signed)
Transition of Care (TOC) - CM/SW Discharge Note ? ? ?Patient Details  ?Name: Cheryl Cooper ?MRN: [id number] ?Date of Birth: January 23, 1941 ? ?Transition of Care (TOC) CM/SW Contact:  ?Ross Ludwig, LCSW ?Phone Number: ?04/21/2021, 10:11 AM ? ? ?Clinical Narrative:    ? ? ?Transition of Care (TOC) Screening Note ? ? ?Patient Details  ?Name: Cheryl Cooper ?Date of Birth: 1940/09/22 ? ? ?Transition of Care (TOC) CM/SW Contact:    ?Ross Ludwig, LCSW ?Phone Number: ?04/21/2021, 10:11 AM ? ? ? ?Transition of Care Department [facility]) has reviewed patient and no TOC needs have been identified at this time. We will continue to monitor patient advancement through interdisciplinary progression rounds. If new patient transition needs arise, please place a TOC consult. ?  ? ? ?  ?  ? ? ?Patient Goals and CMS Choice ?  ?  ?  ? ?Discharge Placement ?  ?           ?  ?  ?  ?  ? ?Discharge Plan and Services ?  ?  ?           ?  ?  ?  ?  ?  ?  ?  ?  ?  ?  ? ?Social Determinants of Health (SDOH) Interventions ?  ? ? ?Readmission Risk Interventions ?No flowsheet data found. ? ? ? ? ?

## 2021-04-21 NOTE — Plan of Care (Signed)
  Problem: Coping: Goal: Level of anxiety will decrease Outcome: Progressing   Problem: Pain Managment: Goal: General experience of comfort will improve Outcome: Progressing   Problem: Safety: Goal: Ability to remain free from injury will improve Outcome: Progressing   

## 2021-04-23 ENCOUNTER — Encounter (HOSPITAL_COMMUNITY): Payer: Self-pay | Admitting: Orthopedic Surgery

## 2021-04-23 NOTE — Discharge Summary (Signed)
Patient ID: Cheryl Cooper MRN: [id number] DOB/AGE: Dec 21, 1940 81 y.o.  Admit date: 04/20/2021 Discharge date: 04/21/2021  Primary Diagnosis: Right shoulder rotator cuff arthropathy Admission Diagnoses: Status post right shoulder arthroplasty Past Medical History:  Diagnosis Date   Anxiety    Arthritis    Cancer ([place])    Carpal tunnel syndrome    Deafness in left ear    Depression    Diabetes mellitus    diet controlled   Diverticulitis    GERD (gastroesophageal reflux disease)    Heart murmur    Hypercholesteremia    Hypertension    Hypothyroid    Memory loss    reports d/t concussion   PONV (postoperative nausea and vomiting) yrs ago, none recent   Post concussion syndrome    Stroke [facility])    reports they were silent   Discharge Diagnoses:   Principal Problem:   S/P shoulder replacement, right  Estimated body mass index is 24.99 kg/m as calculated from the following:   Height as of this encounter: 5' 3.5" (1.613 m).   Weight as of this encounter: 65 kg.  Procedure:  Procedure(s) (LRB): REVERSE SHOULDER ARTHROPLASTY (Right)   Consults: None  HPI: Cheryl Cooper is a 81 year old female was initially seen in the clinic showing end-stage arthritis and signs of chronic rotator cuff arthropathy.  Patient failed conservative treatments and after evaluation consultation in the clinic patient elected proceed with a right reverse total shoulder arthroplasty.  She presented to the operating room on 04/20/2021 for surgery.  Laboratory Data: Admission on 04/20/2021, Discharged on 04/21/2021  Component Date Value Ref Range Status   ABO/RH(D) 04/20/2021 A POS   Final   Antibody Screen 04/20/2021 NEG   Final   Sample Expiration 04/20/2021    Final                   Value:04/23/2021,2359 Performed at [facility], [place] [address] [place], [place] [address]    Glucose-Capillary 04/20/2021 126 (H)  70 - 99 mg/dL Final   Glucose reference range applies only to  samples taken after fasting for at least 8 hours.   Comment 1 04/20/2021 Notify RN   Final   Comment 2 04/20/2021 Document in Chart   Final   Glucose-Capillary 04/20/2021 177 (H)  70 - 99 mg/dL Final   Glucose reference range applies only to samples taken after fasting for at least 8 hours.   Glucose-Capillary 04/20/2021 137 (H)  70 - 99 mg/dL Final   Glucose reference range applies only to samples taken after fasting for at least 8 hours.   Glucose-Capillary 04/20/2021 172 (H)  70 - 99 mg/dL Final   Glucose reference range applies only to samples taken after fasting for at least 8 hours.   Hemoglobin 04/21/2021 11.6 (L)  12.0 - 15.0 g/dL Final   HCT 04/21/2021 35.6 (L)  36.0 - 46.0 % Final   Performed at [facility], [place] [address] [place], [place] [address]   Sodium 04/21/2021 132 (L)  135 - 145 mmol/L Final   Potassium 04/21/2021 3.8  3.5 - 5.1 mmol/L Final   Chloride 04/21/2021 102  98 - 111 mmol/L Final   CO2 04/21/2021 24  22 - 32 mmol/L Final   Glucose, Bld 04/21/2021 137 (H)  70 - 99 mg/dL Final   Glucose reference range applies only to samples taken after fasting for at least 8 hours.   BUN 04/21/2021 12  8 - 23 mg/dL  Final   Creatinine, Ser 04/21/2021 0.85  0.44 - 1.00 mg/dL Final   Calcium 04/21/2021 8.3 (L)  8.9 - 10.3 mg/dL Final   GFR, Estimated 04/21/2021 >60  >60 mL/min Final   Comment: (NOTE) Calculated using the CKD-EPI Creatinine Equation (2021)    Anion gap 04/21/2021 6  5 - 15 Final   Performed at [facility], [place] [address], [place], [place] [address]   Glucose-Capillary 04/20/2021 215 (H)  70 - 99 mg/dL Final   Glucose reference range applies only to samples taken after fasting for at least 8 hours.   Glucose-Capillary 04/21/2021 126 (H)  70 - 99 mg/dL Final   Glucose reference range applies only to samples taken after fasting for at least 8 hours.  Hospital Outpatient Visit on 04/18/2021  Component Date Value Ref Range  Status   SARS Coronavirus 2 04/18/2021 NEGATIVE  NEGATIVE Final   Comment: (NOTE) SARS-CoV-2 target nucleic acids are NOT DETECTED.  The SARS-CoV-2 RNA is generally detectable in upper and lower respiratory specimens during the acute phase of infection. Negative results do not preclude SARS-CoV-2 infection, do not rule out co-infections with other pathogens, and should not be used as the sole basis for treatment or other patient management decisions. Negative results must be combined with clinical observations, patient history, and epidemiological information. The expected result is Negative.  Fact Sheet for Patients: SugarRoll.be  Fact Sheet for Healthcare Providers: https://www.woods-mathews.com/  This test is not yet approved or cleared by the [place] FDA and  has been authorized for detection and/or diagnosis of SARS-CoV-2 by FDA under an Emergency Use Authorization (EUA). This EUA will remain  in effect (meaning this test can be used) for the duration of the COVID-19 declaration under Se                          ction 564(b)(1) of the Act, 21 U.S.C. section 360bbb-3(b)(1), unless the authorization is terminated or revoked sooner.  Performed at [facility], [place] [address], [place], [place] [address]   Appointment on 04/05/2021  Component Date Value Ref Range Status   Area-P 1/2 04/05/2021 2.99  cm2 Final   S' Lateral 04/05/2021 2.10  cm Final   AV Area mean vel 04/05/2021 1.08  cm2 Final   AR max vel 04/05/2021 1.06  cm2 Final   AV Area VTI 04/05/2021 1.21  cm2 Final   Ao pk vel 04/05/2021 3.88  m/s Final   AV Mean grad 04/05/2021 34.0  mmHg Final   AV Peak grad 04/05/2021 60.2  mmHg Final  Hospital Outpatient Visit on 03/28/2021  Component Date Value Ref Range Status   Hgb A1c MFr Bld 03/28/2021 6.5 (H)  4.8 - 5.6 % Final   Comment: (NOTE) Pre diabetes:          5.7%-6.4%  Diabetes:               >6.4%  Glycemic control for   <7.0% adults with diabetes    Mean Plasma Glucose 03/28/2021 139.85  mg/dL Final   Performed at [facility], [address], [place], [place] [address]   Sodium 03/28/2021 136  135 - 145 mmol/L Final   Potassium 03/28/2021 3.7  3.5 - 5.1 mmol/L Final   Chloride 03/28/2021 103  98 - 111 mmol/L Final   CO2 03/28/2021 27  22 - 32 mmol/L Final   Glucose, Bld 03/28/2021 109 (H)  70 -  99 mg/dL Final   Glucose reference range applies only to samples taken after fasting for at least 8 hours.   BUN 03/28/2021 13  8 - 23 mg/dL Final   Creatinine, Ser 03/28/2021 0.84  0.44 - 1.00 mg/dL Final   Calcium 03/28/2021 9.0  8.9 - 10.3 mg/dL Final   GFR, Estimated 03/28/2021 >60  >60 mL/min Final   Comment: (NOTE) Calculated using the CKD-EPI Creatinine Equation (2021)    Anion gap 03/28/2021 6  5 - 15 Final   Performed at [facility], [place] [address] [place], [place] [address]   WBC 03/28/2021 8.7  4.0 - 10.5 K/uL Final   RBC 03/28/2021 4.87  3.87 - 5.11 MIL/uL Final   Hemoglobin 03/28/2021 13.9  12.0 - 15.0 g/dL Final   HCT 03/28/2021 43.6  36.0 - 46.0 % Final   MCV 03/28/2021 89.5  80.0 - 100.0 fL Final   MCH 03/28/2021 28.5  26.0 - 34.0 pg Final   MCHC 03/28/2021 31.9  30.0 - 36.0 g/dL Final   RDW 03/28/2021 14.6  11.5 - 15.5 % Final   Platelets 03/28/2021 203  150 - 400 K/uL Final   nRBC 03/28/2021 0.0  0.0 - 0.2 % Final   Performed at [facility], [place] [address], [place], [place] [address]   MRSA, PCR 03/28/2021 NEGATIVE  NEGATIVE Final   Staphylococcus aureus 03/28/2021 POSITIVE (A)  NEGATIVE Final   Comment: (NOTE) The Xpert SA Assay (FDA approved for NASAL specimens in patients 66 years of age and older), is one component of a comprehensive surveillance program. It is not intended to diagnose infection nor to guide or monitor treatment. Performed at [facility], [place] [address] [place], [place] [address]    Glucose-Capillary 03/28/2021 124 (H)  70 - 99 mg/dL Final   Glucose reference range applies only to samples taken after fasting for at least 8 hours.  Office Visit on 03/20/2021  Component Date Value Ref Range Status   WBC 03/20/2021 8.6  4.0 - 10.5 K/uL Final   RBC 03/20/2021 4.81  3.87 - 5.11 Mil/uL Final   Platelets 03/20/2021 193.0  150.0 - 400.0 K/uL Final   Hemoglobin 03/20/2021 13.7  12.0 - 15.0 g/dL Final   HCT 03/20/2021 41.1  36.0 - 46.0 % Final   MCV 03/20/2021 85.4  78.0 - 100.0 fl Final   MCHC 03/20/2021 33.3  30.0 - 36.0 g/dL Final   RDW 03/20/2021 14.5  11.5 - 15.5 % Final   Sodium 03/20/2021 137  135 - 145 mEq/L Final   Potassium 03/20/2021 4.3  3.5 - 5.1 mEq/L Final   Chloride 03/20/2021 101  96 - 112 mEq/L Final   CO2 03/20/2021 31  19 - 32 mEq/L Final   Glucose, Bld 03/20/2021 111 (H)  70 - 99 mg/dL Final   BUN 03/20/2021 12  6 - 23 mg/dL Final   Creatinine, Ser 03/20/2021 0.92  0.40 - 1.20 mg/dL Final   GFR 03/20/2021 59.54 (L)  >60.00 mL/min Final   Calculated using the CKD-EPI Creatinine Equation (2021)   Calcium 03/20/2021 9.4  8.4 - 10.5 mg/dL Final   Total Bilirubin 03/20/2021 0.3  0.2 - 1.2 mg/dL Final   Bilirubin, Direct 03/20/2021 0.1  0.0 - 0.3 mg/dL Final   Alkaline Phosphatase 03/20/2021 41  39 - 117 U/L Final   AST 03/20/2021 30  0 - 37 U/L Final   ALT 03/20/2021 40 (H)  0 - 35 U/L  Final   Total Protein 03/20/2021 6.8  6.0 - 8.3 g/dL Final   Albumin 03/20/2021 3.8  3.5 - 5.2 g/dL Final   Direct LDL 03/20/2021 53.0  mg/dL Final   Optimal:  <100 mg/dLNear or Above Optimal:  100-129 mg/dLBorderline High:  130-159 mg/dLHigh:  160-189 mg/dLVery High:  >190 mg/dL   Color, Urine 03/20/2021 YELLOW  Yellow;Lt. Yellow;Straw;Dark Yellow;Amber;Green;Red;Brown Final   APPearance 03/20/2021 CLEAR  Clear;Turbid;Slightly Cloudy;Cloudy Final   Specific Gravity, Urine 03/20/2021 1.015  1.000 - 1.030 Final   pH 03/20/2021 6.0  5.0 - 8.0  Final   Total Protein, Urine 03/20/2021 NEGATIVE  Negative Final   Urine Glucose 03/20/2021 NEGATIVE  Negative Final   Ketones, ur 03/20/2021 NEGATIVE  Negative Final   Bilirubin Urine 03/20/2021 NEGATIVE  Negative Final   Hgb urine dipstick 03/20/2021 NEGATIVE  Negative Final   Urobilinogen, UA 03/20/2021 0.2  0.0 - 1.0 Final   Leukocytes,Ua 03/20/2021 NEGATIVE  Negative Final   Nitrite 03/20/2021 NEGATIVE  Negative Final   WBC, UA 03/20/2021 7-10/hpf (A)  0-2/hpf Final   RBC / HPF 03/20/2021 none seen  0-2/hpf Final   Mucus, UA 03/20/2021 Presence of (A)  None Final   Squamous Epithelial / LPF 03/20/2021 Rare(0-4/hpf)  Rare(0-4/hpf) Final   Ca Oxalate Crys, UA 03/20/2021 Presence of (A)  None Final   TSH 03/20/2021 2.24  0.35 - 5.50 uIU/mL Final   Vitamin B-12 03/20/2021 >1504 (H)  211 - 911 pg/mL Final   Iron 03/20/2021 70  45 - 160 mcg/dL Final   TIBC 03/20/2021 414  250 - 450 mcg/dL (calc) Final   %SAT 03/20/2021 17  16 - 45 % (calc) Final   Ferritin 03/20/2021 6 (L)  16 - 288 ng/mL Final     X-Rays:DG Shoulder Right Port  Result Date: 04/20/2021 CLINICAL DATA:  Post RIGHT shoulder replacement EXAM: RIGHT SHOULDER - 1 VIEW COMPARISON:  Portable exam 0905 hours, single view FINDINGS: Single AP view demonstrates reverse RIGHT shoulder arthroplasty. Bones demineralized. No fracture, dislocation or bone destruction. IMPRESSION: Reverse RIGHT shoulder arthroplasty without acute complication. Electronically Signed   By: Lavonia Dana M.D.   On: 04/20/2021 09:40   ECHOCARDIOGRAM COMPLETE  Result Date: 04/05/2021    ECHOCARDIOGRAM REPORT   Patient Name:   Cheryl Cooper Date of Exam: 04/05/2021 Medical Rec #:  [id number]       Height:       63.5 in Accession #:    [id number]      Weight:       137.0 lb Date of Birth:  [birth year]        BSA:          1.656 m Patient Age:    65 years        BP:           122/64 mmHg Patient Gender: F               HR:           63 bpm. Exam Location:  [place]  Procedure: 2D Echo, 3D Echo, Cardiac Doppler, Color Doppler and Strain Analysis Indications:    I35 Aortic stenosis  History:        Patient has prior history of Echocardiogram examinations, most                 recent 03/31/2020. Stroke, Aortic Valve Disease; Risk  Factors:Diabetes, Dyslipidemia and Hypertension. Anemia.  Sonographer:    Basilia Jumbo BS, RDCS Referring Phys: [id number] [place]  1. Left ventricular diastolic parameters are consistent with Grade I diastolic dysfunction (impaired relaxation). Elevated left ventricular end-diastolic pressure. The E/e' is 71. The average left ventricular global longitudinal strain is -23.4 %. The global longitudinal strain is normal  2. Left ventricular ejection fraction, by estimation, is >75%. Left ventricular ejection fraction by PLAX is 78 %. The left ventricle has hyperdynamic function. The left ventricle has no regional wall motion abnormalities. There is moderate left ventricular hypertrophy of the basal-septal segment. There appears to be some dynamic LVOT obstruction as well with increased LVOT gradient up to 54 mmHg associated with valsalva.  3. Right ventricular systolic function is normal. The right ventricular size is normal. Tricuspid regurgitation signal is inadequate for assessing PA pressure.  4. The mitral valve is abnormal. Trivial mitral valve regurgitation.  5. The aortic valve is tricuspid. There is moderate calcification of the aortic valve. Aortic valve regurgitation is trivial. Moderate aortic valve stenosis. Aortic valve area, by VTI measures 1.21 cm. Aortic valve mean gradient measures 34.0 mmHg. Aortic valve Vmax measures 3.88 m/s. Sharma Covert is 0.32.  6. The inferior vena cava is normal in size with greater than 50% respiratory variability, suggesting right atrial pressure of 3 mmHg.  7. Cannot exclude a small PFO. Comparison(s): Changes from prior study are noted. 03/31/20 EF >75%. Moderate AS 11mHg mean PG,  562mg peak PG. FINDINGS  Left Ventricle: Left ventricular ejection fraction, by estimation, is >75%. Left ventricular ejection fraction by PLAX is 78 %. The left ventricle has hyperdynamic function. The left ventricle has no regional wall motion abnormalities. The average left ventricular global longitudinal strain is -23.4 %. The global longitudinal strain is normal. The left ventricular internal cavity size was normal in size. There is moderate left ventricular hypertrophy of the basal-septal segment. Left ventricular diastolic parameters are consistent with Grade I diastolic dysfunction (impaired relaxation). Elevated left ventricular end-diastolic pressure. The E/e' is 1729Right Ventricle: The right ventricular size is normal. No increase in right ventricular wall thickness. Right ventricular systolic function is normal. Tricuspid regurgitation signal is inadequate for assessing PA pressure. Left Atrium: Left atrial size was normal in size. Right Atrium: Right atrial size was normal in size. Pericardium: There is no evidence of pericardial effusion. Mitral Valve: The mitral valve is abnormal. There is mild calcification of the anterior and posterior mitral valve leaflet(s). Mild mitral annular calcification. Trivial mitral valve regurgitation. Tricuspid Valve: The tricuspid valve is grossly normal. Tricuspid valve regurgitation is trivial. Aortic Valve: The aortic valve is tricuspid. There is moderate calcification of the aortic valve. Aortic valve regurgitation is trivial. Moderate aortic stenosis is present. Aortic valve mean gradient measures 34.0 mmHg. Aortic valve peak gradient measures 60.2 mmHg. Aortic valve area, by VTI measures 1.21 cm. Pulmonic Valve: The pulmonic valve was normal in structure. Pulmonic valve regurgitation is not visualized. Aorta: The aortic root and ascending aorta are structurally normal, with no evidence of dilitation. Venous: The inferior vena cava is normal in size with greater  than 50% respiratory variability, suggesting right atrial pressure of 3 mmHg. IAS/Shunts: Cannot exclude a small PFO.  LEFT VENTRICLE PLAX 2D LV EF:         Left            Diastology                ventricular  LV e' medial:    6.23 cm/s                ejection        LV E/e' medial:  18.8                fraction by     LV e' lateral:   7.80 cm/s                PLAX is 78      LV E/e' lateral: 15.0                %. LVIDd:         3.90 cm         2D LVIDs:         2.10 cm         Longitudinal LV PW:         1.00 cm         Strain LV IVS:        1.10 cm         2D Strain GLS  -26.4 % LVOT diam:     2.20 cm         (A2C): LV SV:         97              2D Strain GLS  -18.4 % LV SV Index:   59              (A3C): LVOT Area:     3.80 cm        2D Strain GLS  -25.3 %                                (A4C):                                2D Strain GLS  -23.4 %                                Avg:                                 3D Volume EF:                                3D EF:        57 %                                LV EDV:       101 ml                                LV ESV:       43 ml                                LV SV:        57 ml RIGHT VENTRICLE             IVC RV Basal  diam:  2.50 cm     IVC diam: 1.61 cm RV S prime:     13.60 cm/s TAPSE (M-mode): 1.9 cm LEFT ATRIUM             Index        RIGHT ATRIUM           Index LA diam:        4.00 cm 2.42 cm/m   RA Pressure: 3.00 mmHg LA Vol (A2C):   36.3 ml 21.92 ml/m  RA Area:     7.68 cm LA Vol (A4C):   32.6 ml 19.68 ml/m  RA Volume:   14.00 ml  8.45 ml/m LA Biplane Vol: 34.4 ml 20.77 ml/m  AORTIC VALVE AV Area (Vmax):    1.06 cm AV Area (Vmean):   1.08 cm AV Area (VTI):     1.21 cm AV Vmax:           388.00 cm/s AV Vmean:          273.000 cm/s AV VTI:            0.802 m AV Peak Grad:      60.2 mmHg AV Mean Grad:      34.0 mmHg LVOT Vmax:         108.00 cm/s LVOT Vmean:        77.800 cm/s LVOT VTI:          0.256 m LVOT/AV VTI ratio: 0.32  AORTA Ao Root  diam: 2.80 cm Ao Asc diam:  3.20 cm MITRAL VALVE                TRICUSPID VALVE                             Estimated RAP:  3.00 mmHg MV Decel Time: 254 msec MV E velocity: 117.00 cm/s  SHUNTS MV A velocity: 136.00 cm/s  Systemic VTI:  0.26 m MV E/A ratio:  0.86         Systemic Diam: 2.20 cm Lyman Bishop MD Electronically signed by Lyman Bishop MD Signature Date/Time: 04/05/2021/1:16:10 PM    Final     EKG: Orders placed or performed in visit on 04/13/21   EKG 12-Lead     Hospital Course: ANAHIT KLUMB is a 81 y.o. who was admitted to Hospital. They were brought to the operating room on 04/20/2021 and underwent Procedure(s): REVERSE SHOULDER ARTHROPLASTY.  Patient tolerated the procedure well and was later transferred to the recovery room and then to the orthopaedic floor for postoperative care.  They were given PO and IV analgesics for pain control following their surgery.  They were given 24 hours of postoperative antibiotics of  Anti-infectives (From admission, onward)    Start     Dose/Rate Route Frequency Ordered Stop   04/20/21 1330  ceFAZolin (ANCEF) IVPB 1 g/50 mL premix        1 g 100 mL/hr over 30 Minutes Intravenous Every 6 hours 04/20/21 1045 04/21/21 0222   04/20/21 0600  ceFAZolin (ANCEF) IVPB 2g/100 mL premix        2 g 200 mL/hr over 30 Minutes Intravenous On call to O.R. 04/20/21 [id number] 04/20/21 0745      and started on DVT prophylaxis in the form of Foot Pumps and SCD .   OT were ordered for total joint protocol.  Discharge planning consulted to help with postop disposition and equipment needs.  Patient  had  an uneventful night on the evening of surgery.  They started to get up OOB with therapy on day one.  The patient had progressed with therapy and meeting their goals.  Incision was healing well.  Patient was seen in rounds and was ready to go home.   Diet: Regular diet Activity:NWB RUE Follow-up:in 2 weeks Disposition - Home Discharged Condition: good   Discharge  Instructions     Call MD / Call 911   Complete by: As directed    If you experience chest pain or shortness of breath, CALL 911 and be transported to the hospital emergency room.  If you develope a fever above 101 F, pus (white drainage) or increased drainage or redness at the wound, or calf pain, call your surgeon's office.   Constipation Prevention   Complete by: As directed    Drink plenty of fluids.  Prune juice may be helpful.  You may use a stool softener, such as Colace (over the counter) 100 mg twice a day.  Use MiraLax (over the counter) for constipation as needed.   Diet - low sodium heart healthy   Complete by: As directed    Increase activity slowly as tolerated   Complete by: As directed    Post-operative opioid taper instructions:   Complete by: As directed    POST-OPERATIVE OPIOID TAPER INSTRUCTIONS: It is important to wean off of your opioid medication as soon as possible. If you do not need pain medication after your surgery it is ok to stop day one. Opioids include: Codeine, Hydrocodone(Norco, Vicodin), Oxycodone(Percocet, oxycontin) and hydromorphone amongst others.  Long term and even short term use of opiods can cause: Increased pain response Dependence Constipation Depression Respiratory depression And more.  Withdrawal symptoms can include Flu like symptoms Nausea, vomiting And more Techniques to manage these symptoms Hydrate well Eat regular healthy meals Stay active Use relaxation techniques(deep breathing, meditating, yoga) Do Not substitute Alcohol to help with tapering If you have been on opioids for less than two weeks and do not have pain than it is ok to stop all together.  Plan to wean off of opioids This plan should start within one week post op of your joint replacement. Maintain the same interval or time between taking each dose and first decrease the dose.  Cut the total daily intake of opioids by one tablet each day Next start to increase  the time between doses. The last dose that should be eliminated is the evening dose.         Allergies as of 04/21/2021       Reactions   Other Other (See Comments), Rash   Tomato sauce, garlic, onion - severe acid reflux    Flexeril [cyclobenzaprine] Other (See Comments)   Per spouse "she felt like a zombie"   Atorvastatin Other (See Comments)   Unbalanced   Chlordiazepoxide-clidinium Other (See Comments)   Dizziness (intolerance)   Codeine Nausea Only, Rash        Medication List     TAKE these medications    alendronate 70 MG tablet Commonly known as: FOSAMAX Take 1 tablet (70 mg total) by mouth every Sunday. Take with a full glass of water on an empty stomach.   amLODipine 5 MG tablet Commonly known as: NORVASC Take 1.5 tablets (7.5 mg total) by mouth daily.   CALCIUM-D PO Take 2 tablets by mouth daily. CHEWABLES   Cholecalciferol 25 MCG (1000 UT) tablet Take 1,000 Units by mouth daily.  cyanocobalamin 100 MCG tablet Take 100 mcg by mouth daily.   escitalopram 20 MG tablet Commonly known as: LEXAPRO Take 1 tablet (20 mg total) by mouth daily.   esomeprazole 40 MG capsule Commonly known as: NEXIUM Take 1 capsule (40 mg total) by mouth 2 (two) times daily before a meal.   EYE DROPS OP Place 1 drop into both eyes 2 (two) times daily.   ezetimibe 10 MG tablet Commonly known as: ZETIA Take 1 tablet (10 mg total) by mouth daily.   Ginkgo Biloba 40 MG Caps Take 40 mg by mouth daily.   glucose blood test strip daily.   levothyroxine 75 MCG tablet Commonly known as: SYNTHROID TAKE ONE TABLET BY MOUTH DAILY WITH BREAKFAST   oxybutynin 15 MG 24 hr tablet Commonly known as: DITROPAN XL Take 1 tablet (15 mg total) by mouth at bedtime.   PROBIOTIC PO Take 1 tablet by mouth daily.   rosuvastatin 10 MG tablet Commonly known as: CRESTOR TAKE ONE TABLET BY MOUTH DAILY   traMADol 50 MG tablet Commonly known as: ULTRAM Take 1 tablet (50 mg total) by  mouth every 6 (six) hours as needed. What changed:  when to take this reasons to take this   traZODone 50 MG tablet Commonly known as: DESYREL Take 0.5 tablets (25 mg total) by mouth at bedtime.   WOMENS MULTI GUMMIES PO Take 2 tablets by mouth daily.        Follow-up Information     Netta Cedars, MD. Call in 2 week(s).   Specialty: Orthopedic Surgery Why: call [phone]  for appt in two weeks Contact information: [address] [place] [address] [phone]                 Signed: Jonelle Sidle PA-C Orthopaedic Surgery 04/23/2021, 3:39 PM

## 2021-04-30 ENCOUNTER — Other Ambulatory Visit: Payer: Self-pay | Admitting: Urology

## 2021-05-04 DIAGNOSIS — Z4789 Encounter for other orthopedic aftercare: Secondary | ICD-10-CM | POA: Diagnosis not present

## 2021-05-16 ENCOUNTER — Other Ambulatory Visit: Payer: Self-pay | Admitting: Gastroenterology

## 2021-05-16 DIAGNOSIS — K7460 Unspecified cirrhosis of liver: Secondary | ICD-10-CM

## 2021-05-28 DIAGNOSIS — G5601 Carpal tunnel syndrome, right upper limb: Secondary | ICD-10-CM | POA: Diagnosis not present

## 2021-05-28 DIAGNOSIS — M65849 Other synovitis and tenosynovitis, unspecified hand: Secondary | ICD-10-CM | POA: Diagnosis not present

## 2021-05-28 DIAGNOSIS — M13849 Other specified arthritis, unspecified hand: Secondary | ICD-10-CM | POA: Diagnosis not present

## 2021-05-28 DIAGNOSIS — R52 Pain, unspecified: Secondary | ICD-10-CM | POA: Diagnosis not present

## 2021-05-31 DIAGNOSIS — N952 Postmenopausal atrophic vaginitis: Secondary | ICD-10-CM | POA: Diagnosis not present

## 2021-05-31 DIAGNOSIS — N3091 Cystitis, unspecified with hematuria: Secondary | ICD-10-CM | POA: Diagnosis not present

## 2021-05-31 DIAGNOSIS — R3914 Feeling of incomplete bladder emptying: Secondary | ICD-10-CM | POA: Diagnosis not present

## 2021-05-31 DIAGNOSIS — N3941 Urge incontinence: Secondary | ICD-10-CM | POA: Diagnosis not present

## 2021-06-05 ENCOUNTER — Encounter: Payer: Self-pay | Admitting: Family Medicine

## 2021-06-05 DIAGNOSIS — Z4789 Encounter for other orthopedic aftercare: Secondary | ICD-10-CM | POA: Diagnosis not present

## 2021-06-11 ENCOUNTER — Other Ambulatory Visit: Payer: Medicare PPO

## 2021-06-12 ENCOUNTER — Ambulatory Visit
Admission: RE | Admit: 2021-06-12 | Discharge: 2021-06-12 | Disposition: A | Discharge status: Home / Self Care | Payer: Medicare PPO | Source: Ambulatory Visit | Attending: Gastroenterology | Admitting: Gastroenterology

## 2021-06-12 DIAGNOSIS — K7460 Unspecified cirrhosis of liver: Secondary | ICD-10-CM

## 2021-06-12 DIAGNOSIS — Z8719 Personal history of other diseases of the digestive system: Secondary | ICD-10-CM | POA: Diagnosis not present

## 2021-06-15 DIAGNOSIS — G5601 Carpal tunnel syndrome, right upper limb: Secondary | ICD-10-CM | POA: Diagnosis not present

## 2021-06-25 ENCOUNTER — Encounter: Payer: Self-pay | Admitting: Family Medicine

## 2021-06-25 ENCOUNTER — Ambulatory Visit: Payer: Medicare PPO | Admitting: Family Medicine

## 2021-06-25 VITALS — BP 122/68 | HR 64 | Temp 97.1°F | Ht 63.0 in | Wt 140.2 lb

## 2021-06-25 DIAGNOSIS — R413 Other amnesia: Secondary | ICD-10-CM | POA: Diagnosis not present

## 2021-06-25 DIAGNOSIS — F32A Depression, unspecified: Secondary | ICD-10-CM

## 2021-06-25 DIAGNOSIS — E538 Deficiency of other specified B group vitamins: Secondary | ICD-10-CM | POA: Insufficient documentation

## 2021-06-25 DIAGNOSIS — E611 Iron deficiency: Secondary | ICD-10-CM | POA: Insufficient documentation

## 2021-06-25 MED ORDER — TRAZODONE HCL 50 MG PO TABS: 25.0000 mg | ORAL_TABLET | Freq: Every day | ORAL | 1 refills | Status: DC

## 2021-06-25 MED ORDER — DONEPEZIL HCL 5 MG PO TABS: 5.0000 mg | ORAL_TABLET | Freq: Every day | ORAL | 0 refills | Status: DC

## 2021-06-25 NOTE — Progress Notes (Signed)
? ?Established Patient Office Visit ? ?Subjective   ?Patient ID: Cheryl Cooper, female    DOB: 01-07-1941  Age: 81 y.o. MRN: [id number] ? ?Chief Complaint  ?Patient presents with  ? Follow-up  ?  Routine follow up, would like to discuss possibly taking something else to help with memory.   ? ? ?HPI expresses concern about her memory.  Husband and daughter have not been concerned.  Continues to drive and does not get lost.  Sometimes forgets where she puts things.  Sometimes has problems finding words she would like to use.  Continues with Lexapro and it is helpful.  He is taking a multivitamin with iron.  Status post recent carpal tunnel surgery.  Did not tolerate OxyContin.  Currently taking Ultram on a as needed basis only. ?Amount ? ?2 ? ? ? ? ?Review of Systems  ?Constitutional:  Negative for chills, diaphoresis, malaise/fatigue and weight loss.  ?HENT: Negative.    ?Eyes: Negative.  Negative for blurred vision and double vision.  ?Cardiovascular:  Negative for chest pain.  ?Gastrointestinal:  Negative for abdominal pain.  ?Genitourinary: Negative.   ?Musculoskeletal:  Negative for falls and myalgias.  ?Neurological:  Negative for speech change, loss of consciousness and weakness.  ?Psychiatric/Behavioral: Negative.    ? ?  ? ?  06/25/2021  ?  1:50 PM 03/20/2021  ?  4:15 PM 06/22/2020  ?  1:18 PM  ?Depression screen PHQ 2/9  ?Decreased Interest 0 0 0  ?Down, Depressed, Hopeless 0 0 0  ?PHQ - 2 Score 0 0 0  ? ? ? ?Objective:  ?  ? ?BP 122/68 (BP Location: Right Arm, Patient Position: Sitting, Cuff Size: Normal)   Pulse 64   Temp (!) 97.1 ?F (36.2 ?C) (Temporal)   Ht '5\' 3"'$  (1.6 m)   Wt 140 lb 3.2 oz (63.6 kg)   SpO2 95%   BMI 24.84 kg/m?  ? ? ?Physical Exam ?Constitutional:   ?   General: She is not in acute distress. ?   Appearance: Normal appearance. She is not ill-appearing, toxic-appearing or diaphoretic.  ?HENT:  ?   Head: Normocephalic and atraumatic.  ?   Right Ear: External ear normal.  ?   Left Ear:  External ear normal.  ?   Mouth/Throat:  ?   Mouth: Mucous membranes are moist.  ?   Pharynx: Oropharynx is clear. No oropharyngeal exudate or posterior oropharyngeal erythema.  ?Eyes:  ?   General: No scleral icterus.    ?   Right eye: No discharge.     ?   Left eye: No discharge.  ?   Extraocular Movements: Extraocular movements intact.  ?   Conjunctiva/sclera: Conjunctivae normal.  ?   Pupils: Pupils are equal, round, and reactive to light.  ?Cardiovascular:  ?   Rate and Rhythm: Normal rate and regular rhythm.  ?Pulmonary:  ?   Effort: Pulmonary effort is normal. No respiratory distress.  ?   Breath sounds: Normal breath sounds.  ?Abdominal:  ?   General: Bowel sounds are normal.  ?   Tenderness: There is no abdominal tenderness. There is no guarding.  ?Musculoskeletal:  ?   Cervical back: No rigidity or tenderness.  ?Skin: ?   General: Skin is warm and dry.  ?Neurological:  ?   Mental Status: She is alert and oriented to person, place, and time.  ?Psychiatric:     ?   Mood and Affect: Mood normal.     ?  Behavior: Behavior normal.  ? ? ? ?No results found for any visits on 06/25/21. ? ? ? ?The ASCVD Risk score (Arnett DK, et al., 2019) failed to calculate for the following reasons: ?  The patient has a prior MI or stroke diagnosis ? ?  ?Assessment & Plan:  ? ?Problem List Items Addressed This Visit   ? ?  ? Other  ? Depression  ? Relevant Medications  ? traZODone (DESYREL) 50 MG tablet  ? Iron deficiency - Primary  ? Relevant Orders  ? Iron, TIBC and Ferritin Panel  ? CBC  ? B12 deficiency  ? Relevant Orders  ? Vitamin B12  ? Memory difficulty  ? Relevant Medications  ? donepezil (ARICEPT) 5 MG tablet  ? ? ?Return in about 3 months (around 09/25/2021).  ?Trial of low-dose Aricept. ? ?Libby Maw, MD ? ?

## 2021-06-26 ENCOUNTER — Other Ambulatory Visit: Payer: Self-pay | Admitting: Family Medicine

## 2021-06-26 DIAGNOSIS — E7800 Pure hypercholesterolemia, unspecified: Secondary | ICD-10-CM

## 2021-06-26 LAB — CBC
HCT: 39.7 % (ref 36.0–46.0)
Hemoglobin: 13.2 g/dL (ref 12.0–15.0)
MCHC: 33.3 g/dL (ref 30.0–36.0)
MCV: 86.8 fl (ref 78.0–100.0)
Platelets: 198 10*3/uL (ref 150.0–400.0)
RBC: 4.57 Mil/uL (ref 3.87–5.11)
RDW: 14.9 % (ref 11.5–15.5)
WBC: 6 10*3/uL (ref 4.0–10.5)

## 2021-06-26 LAB — IRON,TIBC AND FERRITIN PANEL
%SAT: 19 % (calc) (ref 16–45)
Ferritin: 21 ng/mL (ref 16–288)
Iron: 63 ug/dL (ref 45–160)
TIBC: 335 mcg/dL (calc) (ref 250–450)

## 2021-06-26 LAB — VITAMIN B12: Vitamin B-12: >1504 pg/mL — ABNORMAL HIGH (ref 211–911)

## 2021-07-02 DIAGNOSIS — M79641 Pain in right hand: Secondary | ICD-10-CM | POA: Diagnosis not present

## 2021-07-24 ENCOUNTER — Telehealth: Payer: Self-pay | Admitting: Family Medicine

## 2021-07-24 NOTE — Telephone Encounter (Signed)
Appointment scheduled for OV

## 2021-07-24 NOTE — Telephone Encounter (Signed)
Called patient to go over symptoms and possible schedule OV for evaluation no answer LMTCB and schedule appointment.

## 2021-07-24 NOTE — Telephone Encounter (Signed)
Pt is having trouble with urination too much is there anything she can take or a recommendation for something that will help?

## 2021-07-26 ENCOUNTER — Ambulatory Visit: Payer: Medicare PPO | Admitting: Family Medicine

## 2021-07-26 ENCOUNTER — Encounter: Payer: Self-pay | Admitting: Family Medicine

## 2021-07-26 VITALS — BP 146/70 | HR 52 | Temp 97.3°F | Ht 63.0 in | Wt 136.6 lb

## 2021-07-26 DIAGNOSIS — R350 Frequency of micturition: Secondary | ICD-10-CM | POA: Diagnosis not present

## 2021-07-26 DIAGNOSIS — T887XXA Unspecified adverse effect of drug or medicament, initial encounter: Secondary | ICD-10-CM

## 2021-07-26 LAB — POCT URINALYSIS DIPSTICK
Bilirubin, UA: NEGATIVE
Blood, UA: NEGATIVE
Glucose, UA: NEGATIVE
Ketones, UA: NEGATIVE
Leukocytes, UA: NEGATIVE
Nitrite, UA: NEGATIVE
Protein, UA: NEGATIVE
Spec Grav, UA: 1.02 (ref 1.010–1.025)
Urobilinogen, UA: 0.2 E.U./dL
pH, UA: 6 (ref 5.0–8.0)

## 2021-07-26 NOTE — Progress Notes (Signed)
Established Patient Office Visit  Subjective   Patient ID: Cheryl Cooper, female    DOB: Mar 16, 1940  Age: 81 y.o. MRN: [id number]  Chief Complaint  Patient presents with   Urinary Frequency    Urine frequency becoming worse. No other concerns.    Urinary Frequency  Associated symptoms include frequency. Pertinent negatives include no chills or hematuria.   has been experiencing urinary frequency especially in p.m. for some time now.  Denies dysuria, back pain nausea or vomiting fever or chills.  History of overactive bladder.  Has been.  Prescribed Ditropan XL 15 mg nightly.  Not certain that she is actually taking it.    Review of Systems  Constitutional:  Negative for chills, diaphoresis, malaise/fatigue and weight loss.  HENT: Negative.    Eyes: Negative.  Negative for blurred vision and double vision.  Cardiovascular:  Negative for chest pain.  Gastrointestinal:  Negative for abdominal pain.  Genitourinary:  Positive for frequency. Negative for dysuria and hematuria.  Musculoskeletal:  Negative for falls and myalgias.  Neurological:  Negative for speech change, loss of consciousness and weakness.  Psychiatric/Behavioral: Negative.        Objective:     BP (!) 146/70 (BP Location: Left Arm, Patient Position: Sitting, Cuff Size: Normal)   Pulse (!) 52   Temp (!) 97.3 F (36.3 C) (Temporal)   Ht '5\' 3"'$  (1.6 m)   Wt 136 lb 9.6 oz (62 kg)   SpO2 96%   BMI 24.20 kg/m    Physical Exam Constitutional:      General: She is not in acute distress.    Appearance: Normal appearance. She is not ill-appearing, toxic-appearing or diaphoretic.  HENT:     Head: Normocephalic and atraumatic.     Right Ear: External ear normal.     Left Ear: External ear normal.  Eyes:     General: No scleral icterus.       Right eye: No discharge.        Left eye: No discharge.     Extraocular Movements: Extraocular movements intact.     Conjunctiva/sclera: Conjunctivae normal.   Cardiovascular:     Rate and Rhythm: Normal rate and regular rhythm.     Heart sounds: Murmur heard.  Pulmonary:     Effort: Pulmonary effort is normal. No respiratory distress.     Breath sounds: Normal breath sounds.  Abdominal:     General: Bowel sounds are normal.     Tenderness: There is no abdominal tenderness. There is no right CVA tenderness, left CVA tenderness or guarding.  Musculoskeletal:     Cervical back: No rigidity or tenderness.  Skin:    General: Skin is warm and dry.  Neurological:     Mental Status: She is alert and oriented to person, place, and time.  Psychiatric:        Mood and Affect: Mood normal.        Behavior: Behavior normal.      No results found for any visits on 07/26/21.    The ASCVD Risk score (Arnett DK, et al., 2019) failed to calculate for the following reasons:   The patient has a prior MI or stroke diagnosis    Assessment & Plan:   Problem List Items Addressed This Visit       Other   Urine frequency - Primary   Relevant Orders   POCT Urinalysis Dipstick   Other Visit Diagnoses     Medication side effect  Return if symptoms worsen or fail to improve.  Urinalysis was normal.  Patient will check to make sure that she is taking the Ditropan.  She will hold her Aricept for a few weeks to see if it makes a difference and let me know.  Libby Maw, MD

## 2021-07-30 DIAGNOSIS — M7632 Iliotibial band syndrome, left leg: Secondary | ICD-10-CM | POA: Diagnosis not present

## 2021-07-30 DIAGNOSIS — M7062 Trochanteric bursitis, left hip: Secondary | ICD-10-CM | POA: Diagnosis not present

## 2021-08-09 ENCOUNTER — Telehealth: Payer: Self-pay | Admitting: Family Medicine

## 2021-08-09 DIAGNOSIS — M533 Sacrococcygeal disorders, not elsewhere classified: Secondary | ICD-10-CM | POA: Diagnosis not present

## 2021-08-09 NOTE — Telephone Encounter (Signed)
Returned patients call to inform that she does not need a booster, no answer LMTCB

## 2021-08-09 NOTE — Telephone Encounter (Signed)
Please advise 

## 2021-08-09 NOTE — Telephone Encounter (Signed)
Pt got her last shingles shot on 07/11/16, she has recently been exposed to it through her son. She is wondering if she should come in and get a booster. Please advise pt at [phone]

## 2021-08-10 ENCOUNTER — Telehealth: Payer: Self-pay | Admitting: Family Medicine

## 2021-08-14 DIAGNOSIS — H52223 Regular astigmatism, bilateral: Secondary | ICD-10-CM | POA: Diagnosis not present

## 2021-08-14 DIAGNOSIS — Z961 Presence of intraocular lens: Secondary | ICD-10-CM | POA: Diagnosis not present

## 2021-08-14 DIAGNOSIS — H11003 Unspecified pterygium of eye, bilateral: Secondary | ICD-10-CM | POA: Diagnosis not present

## 2021-08-14 DIAGNOSIS — H2511 Age-related nuclear cataract, right eye: Secondary | ICD-10-CM | POA: Diagnosis not present

## 2021-08-14 DIAGNOSIS — H25011 Cortical age-related cataract, right eye: Secondary | ICD-10-CM | POA: Diagnosis not present

## 2021-08-14 DIAGNOSIS — H25041 Posterior subcapsular polar age-related cataract, right eye: Secondary | ICD-10-CM | POA: Diagnosis not present

## 2021-08-18 ENCOUNTER — Other Ambulatory Visit: Payer: Self-pay | Admitting: Family Medicine

## 2021-08-18 DIAGNOSIS — F32A Depression, unspecified: Secondary | ICD-10-CM

## 2021-08-20 DIAGNOSIS — M533 Sacrococcygeal disorders, not elsewhere classified: Secondary | ICD-10-CM | POA: Diagnosis not present

## 2021-08-26 ENCOUNTER — Other Ambulatory Visit: Payer: Self-pay | Admitting: Family Medicine

## 2021-08-26 DIAGNOSIS — E785 Hyperlipidemia, unspecified: Secondary | ICD-10-CM

## 2021-09-12 DIAGNOSIS — S63205A Unspecified subluxation of left ring finger, initial encounter: Secondary | ICD-10-CM | POA: Diagnosis not present

## 2021-09-12 DIAGNOSIS — S63203A Unspecified subluxation of left middle finger, initial encounter: Secondary | ICD-10-CM | POA: Diagnosis not present

## 2021-09-13 ENCOUNTER — Other Ambulatory Visit: Payer: Self-pay | Admitting: Family Medicine

## 2021-09-13 DIAGNOSIS — I10 Essential (primary) hypertension: Secondary | ICD-10-CM

## 2021-09-14 ENCOUNTER — Telehealth: Payer: Self-pay | Admitting: Family Medicine

## 2021-09-14 NOTE — Telephone Encounter (Signed)
Left message for patient to call back and schedule Medicare Annual Wellness Visit (AWV.   Please offer to do virtually or by telephone.  Left office number and my jabber [phone].  Last AWV:10/05/2018  Please schedule at anytime with Nurse Health Advisor.

## 2021-09-27 DIAGNOSIS — M48061 Spinal stenosis, lumbar region without neurogenic claudication: Secondary | ICD-10-CM | POA: Diagnosis not present

## 2021-10-03 DIAGNOSIS — M4316 Spondylolisthesis, lumbar region: Secondary | ICD-10-CM | POA: Diagnosis not present

## 2021-10-03 DIAGNOSIS — M4126 Other idiopathic scoliosis, lumbar region: Secondary | ICD-10-CM | POA: Diagnosis not present

## 2021-10-03 DIAGNOSIS — R351 Nocturia: Secondary | ICD-10-CM | POA: Diagnosis not present

## 2021-10-03 DIAGNOSIS — M545 Low back pain, unspecified: Secondary | ICD-10-CM | POA: Diagnosis not present

## 2021-10-03 DIAGNOSIS — R3914 Feeling of incomplete bladder emptying: Secondary | ICD-10-CM | POA: Diagnosis not present

## 2021-10-03 DIAGNOSIS — M48061 Spinal stenosis, lumbar region without neurogenic claudication: Secondary | ICD-10-CM | POA: Diagnosis not present

## 2021-10-03 DIAGNOSIS — N3941 Urge incontinence: Secondary | ICD-10-CM | POA: Diagnosis not present

## 2021-10-03 DIAGNOSIS — R350 Frequency of micturition: Secondary | ICD-10-CM | POA: Diagnosis not present

## 2021-10-04 DIAGNOSIS — M4310 Spondylolisthesis, site unspecified: Secondary | ICD-10-CM | POA: Diagnosis not present

## 2021-10-04 DIAGNOSIS — M48061 Spinal stenosis, lumbar region without neurogenic claudication: Secondary | ICD-10-CM | POA: Diagnosis not present

## 2021-10-08 ENCOUNTER — Ambulatory Visit (HOSPITAL_COMMUNITY): Payer: Medicare PPO | Attending: Cardiovascular Disease

## 2021-10-08 DIAGNOSIS — I350 Nonrheumatic aortic (valve) stenosis: Secondary | ICD-10-CM | POA: Insufficient documentation

## 2021-10-08 LAB — ECHOCARDIOGRAM COMPLETE
AR max vel: 0.57 cm2
AV Area VTI: 0.46 cm2
AV Area mean vel: 0.54 cm2
AV Mean grad: 69 mmHg
AV Peak grad: 93.7 mmHg
Ao pk vel: 4.84 m/s
Area-P 1/2: 2.73 cm2
S' Lateral: 2.1 cm

## 2021-10-09 ENCOUNTER — Other Ambulatory Visit: Payer: Self-pay

## 2021-10-09 ENCOUNTER — Telehealth: Payer: Self-pay | Admitting: Cardiovascular Disease

## 2021-10-09 DIAGNOSIS — I10 Essential (primary) hypertension: Secondary | ICD-10-CM | POA: Diagnosis not present

## 2021-10-09 DIAGNOSIS — I350 Nonrheumatic aortic (valve) stenosis: Secondary | ICD-10-CM

## 2021-10-09 DIAGNOSIS — E119 Type 2 diabetes mellitus without complications: Secondary | ICD-10-CM | POA: Diagnosis not present

## 2021-10-09 DIAGNOSIS — M8580 Other specified disorders of bone density and structure, unspecified site: Secondary | ICD-10-CM | POA: Diagnosis not present

## 2021-10-09 DIAGNOSIS — E039 Hypothyroidism, unspecified: Secondary | ICD-10-CM | POA: Diagnosis not present

## 2021-10-09 NOTE — Telephone Encounter (Signed)
Called and discussed echocardiogram results with Cheryl Cooper.  She has very severe aortic stenosis.  It has progressed quite a bit in the past 6 months.  She does merit intervention.  She is without symptoms but given the very severe stenosis I believe she should proceed with TAVR evaluation.  Of note, she is in need of back surgery.  Would recommend she do her evaluation of her aortic stenosis and then we can determine the timing of both procedures.  Referral made to structural heart team.  [name] T. Audie Box, MD, [place]  [address] [place], [place] [address] [phone]  8:20 AM

## 2021-10-09 NOTE — Telephone Encounter (Signed)
Referral placed  Thanks!

## 2021-10-10 ENCOUNTER — Telehealth: Payer: Self-pay | Admitting: *Deleted

## 2021-10-10 NOTE — Patient Outreach (Signed)
  Care Coordination   Initial Visit Note   10/10/2021 Name: Cheryl Cooper MRN: [id number] DOB: 1940/06/15  Cheryl Cooper is a 81 y.o. year old female who sees Libby Maw, MD for primary care. I spoke with  [name] by phone today  What matters to the patients health and wellness today?  Pt declines Care Coordination at this time as well as wants to wait on her AWV due to having a lot going on- possible heart and back surgery.     Goals Addressed   None     SDOH assessments and interventions completed:  No     Care Coordination Interventions Activated:  No  Care Coordination Interventions:  No, not indicated   Follow up plan: No further intervention required.   Encounter Outcome:  Pt. Refused

## 2021-10-11 DIAGNOSIS — L918 Other hypertrophic disorders of the skin: Secondary | ICD-10-CM | POA: Diagnosis not present

## 2021-10-11 DIAGNOSIS — L821 Other seborrheic keratosis: Secondary | ICD-10-CM | POA: Diagnosis not present

## 2021-10-11 DIAGNOSIS — D225 Melanocytic nevi of trunk: Secondary | ICD-10-CM | POA: Diagnosis not present

## 2021-10-11 DIAGNOSIS — D692 Other nonthrombocytopenic purpura: Secondary | ICD-10-CM | POA: Diagnosis not present

## 2021-10-11 DIAGNOSIS — Z85828 Personal history of other malignant neoplasm of skin: Secondary | ICD-10-CM | POA: Diagnosis not present

## 2021-10-11 DIAGNOSIS — L720 Epidermal cyst: Secondary | ICD-10-CM | POA: Diagnosis not present

## 2021-10-11 DIAGNOSIS — L853 Xerosis cutis: Secondary | ICD-10-CM | POA: Diagnosis not present

## 2021-10-12 ENCOUNTER — Other Ambulatory Visit (HOSPITAL_COMMUNITY): Payer: Medicare PPO

## 2021-10-16 ENCOUNTER — Ambulatory Visit: Payer: Medicare PPO | Attending: Cardiovascular Disease | Admitting: Cardiovascular Disease

## 2021-10-16 ENCOUNTER — Encounter: Payer: Self-pay | Admitting: Cardiovascular Disease

## 2021-10-16 VITALS — BP 116/70 | HR 70 | Ht 63.75 in | Wt 145.6 lb

## 2021-10-16 DIAGNOSIS — Z01810 Encounter for preprocedural cardiovascular examination: Secondary | ICD-10-CM

## 2021-10-16 DIAGNOSIS — I350 Nonrheumatic aortic (valve) stenosis: Secondary | ICD-10-CM

## 2021-10-16 NOTE — Progress Notes (Signed)
Cardiology Office Note:    Date:  10/16/2021   ID:  MYRIKAL MESSMER, DOB Sep 07, 1940, MRN [id number]  PCP:  Libby Maw, [address] Providers Cardiologist:  None     Referring MD: Libby Maw,*   Chief Complaint  Patient presents with   Aortic Stenosis    History of Present Illness:    Cheryl Cooper is a 81 y.o. Cooper presenting for TAVR evaluation, referred by Dr. Audie Box.  The patient is here alone today.  She has been followed for aortic stenosis for the past 18 months.  Her most recent echo shows marked progression from moderate to very severe aortic stenosis.  The patient's aortic valve is not well visualized but is severely thickened on my review of the study.  The peak systolic velocity is 4.8 m/s with a mean gradient of 69 mmHg and valve area of 0.46 cm with a dimensionless index of 0.18.  The patient was first told of a heart murmur just a few years ago.  She denies any history of rheumatic fever or rheumatic heart disease.  She has experienced progressive symptoms of exertional dyspnea over the past few months.  She also complains of fatigue and intermittent dizziness.  She denies chest pain or pressure.  She denies orthopnea, PND, or leg swelling.  She states that she has not been as active of late as she has been concerned about her heart.  She also has back problems and is pending back surgery, but it has been recommended that her aortic stenosis is corrected first.  She denies any dental problems.  She has an upper dental plate and has regular dental care without any recent issues.  Past Medical History:  Diagnosis Date   Anxiety    Arthritis    Cancer ([place])    Carpal tunnel syndrome    Deafness in left ear    Depression    Diabetes mellitus    diet controlled   Diverticulitis    GERD (gastroesophageal reflux disease)    Heart murmur    Hypercholesteremia    Hypertension    Hypothyroid    Memory loss    reports d/t concussion    PONV (postoperative nausea and vomiting) yrs ago, none recent   Post concussion syndrome    Stroke [facility])    reports they were silent    Past Surgical History:  Procedure Laterality Date   [place]  2010   lower   BIOPSY  01/12/2018   Procedure: BIOPSY;  Surgeon: Irving Copas., MD;  Location: [facility];  Service: Gastroenterology;;   CHOLECYSTECTOMY     ESOPHAGOGASTRODUODENOSCOPY (EGD) WITH PROPOFOL N/A 01/12/2018   Procedure: ESOPHAGOGASTRODUODENOSCOPY (EGD) WITH PROPOFOL;  Surgeon: Irving Copas., MD;  Location: [facility];  Service: Gastroenterology;  Laterality: N/A;   left elobow surgery  2003   left rotator cuff  2001   LUMBAR LAMINECTOMY/DECOMPRESSION MICRODISCECTOMY Left 12/23/2017   Procedure: Laminectomy for facet/synovial cyst - left - Lumbar three-Lumbar four;  Surgeon: Earnie Larsson, MD;  Location: [facility];  Service: Neurosurgery;  Laterality: Left;   r foot surgery  1995   REVERSE SHOULDER ARTHROPLASTY Left 10/23/2018   Procedure: REVERSE TOTAL SHOULDER ARTHROPLASTY;  Surgeon: Netta Cedars, MD;  Location: [facility];  Service: Orthopedics;  Laterality: Left;  interscalene block   REVERSE SHOULDER ARTHROPLASTY Right 04/20/2021   Procedure: REVERSE SHOULDER ARTHROPLASTY;  Surgeon: Netta Cedars, MD;  Location: [facility];  Service: Orthopedics;  Laterality: Right;  with ISB   right hand surgery  2001   right index finger surgery  2009   SHOULDER OPEN ROTATOR CUFF REPAIR  11/21/2011   Procedure: ROTATOR CUFF REPAIR SHOULDER OPEN;  Surgeon: Johnn Hai, MD;  Location: [facility];  Service: Orthopedics;  Laterality: Right;  with subacromial decompression   SHOULDER OPEN ROTATOR CUFF REPAIR Right 05/09/2016   Procedure: Right shoulder mini open revision rotator cuff repair, subacromial decompression;  Surgeon: Susa Day, MD;  Location: [facility];  Service: Orthopedics;  Laterality: Right;   TOTAL HIP ARTHROPLASTY Right 04/24/2017    Procedure: RIGHT TOTAL HIP ARTHROPLASTY ANTERIOR APPROACH;  Surgeon: Rod Can, MD;  Location: [facility];  Service: Orthopedics;  Laterality: Right;  Needs RNFA    Current Medications: Current Meds  Medication Sig   amLODipine (NORVASC) 5 MG tablet TAKE 1 AND 1/2 TABLET BY MOUTH DAILY   Calcium Carbonate-Vitamin D (CALCIUM-D PO) Take 2 tablets by mouth daily. CHEWABLES   Cholecalciferol 25 MCG (1000 UT) tablet Take 1,000 Units by mouth daily.   cyanocobalamin 100 MCG tablet Take 100 mcg by mouth daily.   escitalopram (LEXAPRO) 20 MG tablet TAKE ONE TABLET BY MOUTH DAILY   esomeprazole (NEXIUM) 40 MG capsule Take 1 capsule (40 mg total) by mouth 2 (two) times daily before a meal.   ezetimibe (ZETIA) 10 MG tablet TAKE ONE TABLET BY MOUTH DAILY   Ginkgo Biloba 40 MG CAPS Take 40 mg by mouth daily.   glucose blood test strip daily.   levothyroxine (SYNTHROID) 75 MCG tablet TAKE ONE TABLET BY MOUTH DAILY WITH BREAKFAST   Multiple Vitamins-Minerals (WOMENS MULTI GUMMIES PO) Take 2 tablets by mouth daily.   oxybutynin (DITROPAN XL) 15 MG 24 hr tablet Take 1 tablet (15 mg total) by mouth at bedtime.   Probiotic Product (PROBIOTIC PO) Take 1 tablet by mouth daily.   rosuvastatin (CRESTOR) 10 MG tablet TAKE ONE TABLET BY MOUTH DAILY   traMADol (ULTRAM) 50 MG tablet Take 1 tablet (50 mg total) by mouth every 6 (six) hours as needed.   traZODone (DESYREL) 50 MG tablet Take 0.5 tablets (25 mg total) by mouth at bedtime.     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  Tobacco Use   Smoking status: Never   Smokeless tobacco: Never  Vaping Use   Vaping Use: Never used   Substance and Sexual Activity   Alcohol use: Yes    Alcohol/week: 1.0 standard drink of alcohol    Types: 1 Glasses of wine per week    Comment: occas   Drug use: No   Sexual activity: Not Currently    Comment: 1st intercourse 81 yo-Fewer than 5 partners  Other Topics Concern   Not on file  Social History Narrative   Married, lives at home with husband   caffeine - 1 Coke daily   Social Determinants of Health   Financial Resource Strain: Not on file  Food Insecurity: Not on file  Transportation Needs: Not on file  Physical Activity: Not on file  Stress: Not on file  Social Connections: Not on file     Family History:  The patient's family history includes Dementia in her brother; Diabetes in her brother; Heart attack in her father and mother; Heart disease in her brother; Stroke in her brother, father, sister, and sister.  ROS:   Please see the history of present illness.    All other systems reviewed and are negative.  EKGs/Labs/Other Studies Reviewed:    The following studies were reviewed today: Echo: IMPRESSIONS     1. Very severe aortic stenosis is present. Vmax 4.8 m/s, MG 69 mmHG, AVA  0.46 cm2, DI 0.18. The aortic valve is tricuspid. There is severe  calcifcation of the aortic valve. There is severe thickening of the aortic  valve. Aortic valve regurgitation is  not visualized. Severe aortic valve stenosis.   2. Hyperdynamic LV. Intracavitary gradient 39 mmHG. Left ventricular  ejection fraction, by estimation, is 70 to 75%. The left ventricle has  hyperdynamic function. The left ventricle has no regional wall motion  abnormalities. There is moderate concentric   left ventricular hypertrophy. Left ventricular diastolic parameters are  consistent with Grade I diastolic dysfunction (impaired relaxation).   3. Right ventricular systolic function is normal. The right ventricular  size is normal. Tricuspid regurgitation signal is inadequate for assessing  PA  pressure.   4. The mitral valve is grossly normal. No evidence of mitral valve  regurgitation. No evidence of mitral stenosis.   5. The inferior vena cava is normal in size with greater than 50%  respiratory variability, suggesting right atrial pressure of 3 mmHg.  LVIDd:         3.10 cm   Diastology  LVIDs:         2.10 cm   LV e' medial:    4.24 cm/s  LV PW:         1.40 cm   LV E/e' medial:  20.6  LV IVS:        1.40 cm   LV e' lateral:   5.44 cm/s  LVOT diam:     1.80 cm   LV E/e' lateral: 16.0  LV SV:         53  LV SV Index:   32        2D Longitudinal Strain  LVOT Area:     2.54 cm  2D Strain GLS (A2C):   -25.5 %                           2D Strain GLS (A3C):   -24.8 %                           2D Strain GLS (A4C):   -22.3 %                           2D Strain GLS Avg:     -24.2 %                              3D Volume EF:                           3D EF:        55 %                           LV EDV:  97 ml                           LV ESV:       44 ml                           LV SV:        54 ml   RIGHT VENTRICLE             IVC  RV Basal diam:  2.90 cm     IVC diam: 1.00 cm  RV S prime:     12.35 cm/s  TAPSE (M-mode): 2.1 cm   LEFT ATRIUM             Index        RIGHT ATRIUM          Index  LA diam:        3.90 cm 2.37 cm/m   RA Area:     8.02 cm  LA Vol (A2C):   32.5 ml 19.76 ml/m  RA Volume:   15.40 ml 9.37 ml/m  LA Vol (A4C):   31.7 ml 19.28 ml/m  LA Biplane Vol: 32.1 ml 19.52 ml/m   AORTIC VALVE  AV Area (Vmax):    0.57 cm  AV Area (Vmean):   0.54 cm  AV Area (VTI):     0.46 cm  AV Vmax:           484.00 cm/s  AV Vmean:          354.800 cm/s  AV VTI:            1.150 m  AV Peak Grad:      93.7 mmHg  AV Mean Grad:      69.0 mmHg  LVOT Vmax:         108.00 cm/s  LVOT Vmean:        74.700 cm/s  LVOT VTI:          0.209 m  LVOT/AV VTI ratio: 0.18     AORTA  Ao Root diam: 2.80 cm  Ao Asc diam:  3.10 cm   MITRAL VALVE  MV Area (PHT): 2.73  cm     SHUNTS  MV Decel Time: 278 msec     Systemic VTI:  0.21 m  MV E velocity: 87.30 cm/s   Systemic Diam: 1.80 cm  MV A velocity: 145.00 cm/s  MV E/A ratio:  0.60   EKG:  EKG is not ordered today.  The patient's EKG from April 13, 2021 is reviewed and it shows normal sinus rhythm and is within normal limits.  There is no IVCD or significant conduction disease.  Recent Labs: 03/20/2021: ALT 40; TSH 2.24 04/21/2021: BUN 12; Creatinine, Ser 0.85; Potassium 3.8; Sodium 132 06/25/2021: Hemoglobin 13.2; Platelets 198.0  Recent Lipid Panel    Component Value Date/Time   CHOL 150 07/28/2020 0856   CHOL 147 01/21/2020 0958   TRIG 193.0 (H) 07/28/2020 0856   HDL 47.50 07/28/2020 0856   HDL 48 01/21/2020 0958   CHOLHDL 3 07/28/2020 0856   VLDL 38.6 07/28/2020 0856   LDLCALC 63 07/28/2020 0856   LDLCALC 77 01/21/2020 0958   LDLDIRECT 53.0 03/20/2021 1652     Risk Assessment/Calculations:                Physical Exam:    VS:  BP 116/70   Pulse  70   Ht 5' 3.75" (1.619 m)   Wt 145 lb 9.6 oz (66 kg)   SpO2 95%   BMI 25.19 kg/m     Wt Readings from Last 3 Encounters:  10/16/21 145 lb 9.6 oz (66 kg)  07/26/21 136 lb 9.6 oz (62 kg)  06/25/21 140 lb 3.2 oz (63.6 kg)     GEN:  Well nourished, well developed in no acute distress HEENT: Normal NECK: No JVD; No carotid bruits LYMPHATICS: No lymphadenopathy CARDIAC: RRR, 3/6 harsh crescendo decrescendo murmur, late peaking, with diminished A2 RESPIRATORY:  Clear to auscultation without rales, wheezing or rhonchi  ABDOMEN: Soft, non-tender, non-distended MUSCULOSKELETAL:  No edema; No deformity  SKIN: Warm and dry NEUROLOGIC:  Alert and oriented x 3 PSYCHIATRIC:  Normal affect   ASSESSMENT:    1. Severe aortic stenosis    PLAN:    In order of problems listed above:  The patient has severe, stage D1 aortic stenosis with New York Heart Association functional class II symptoms of exertional dyspnea and fatigue.  She has  had marked progression of her aortic stenosis over the past 6 months based on serial echo studies.  I have personally reviewed her echo study and agree with the findings outlined in the HPI and reports section of this note.  Her valve leaflets appear severely thickened and suprasternal notch imaging confirms very severe aortic stenosis with a mean transvalvular gradient greater than 60 mmHg and calculated valve area of 0.5 cm.  We discussed the natural history of aortic stenosis today and the indication for aortic valve replacement.  Considering the patient's advanced age of 81 years old, TAVR might be the least morbid and safest way to treat her.  She understands that she will need to undergo cardiac catheterization and CTA studies to better define the anatomic feasibility of TAVR and assess for concomitant coronary artery disease. I have reviewed the risks, indications, and alternatives to cardiac catheterization, possible angioplasty, and stenting with the patient. Risks include but are not limited to bleeding, infection, vascular injury, stroke, myocardial infection, arrhythmia, kidney injury, radiation-related injury in the case of prolonged fluoroscopy use, emergency cardiac surgery, and death. The patient understands the risks of serious complication is 1-2 in 3846 with diagnostic cardiac cath and 1-2% or less with angioplasty/stenting.  Once her cardiac catheterization and CTA studies are completed, she will be referred for formal cardiac surgical consultation as part of a multidisciplinary approach to her care.  I agree that her aortic stenosis should be treated before she undergoes back surgery.      Shared Decision Making/Informed Consent The risks [stroke (1 in 1000), death (1 in 1000), kidney failure [usually temporary] (1 in 500), bleeding (1 in 200), allergic reaction [possibly serious] (1 in 200)], benefits (diagnostic support and management of coronary artery disease) and alternatives of a  cardiac catheterization were discussed in detail with Ms. Earleen Newport and she is willing to proceed.    Medication Adjustments/Labs and Tests Ordered: Current medicines are reviewed at length with the patient today.  Concerns regarding medicines are outlined above.  No orders of the defined types were placed in this encounter.  No orders of the defined types were placed in this encounter.   Patient Instructions  Medication Instructions:  Your physician recommends that you continue on your current medications as directed. Please refer to the Current Medication list given to you today.  *If you need a refill on your cardiac medications before your next appointment, please call  your pharmacy*   Lab Work: BMET, CBC Today If you have labs (blood work) drawn today and your tests are completely normal, you will receive your results only by: [place] (if you have MyChart) OR A paper copy in the mail If you have any lab test that is abnormal or we need to change your treatment, we will call you to review the results.   Testing/Procedures: R & L heart Catheterization Your physician has requested that you have a cardiac catheterization. Cardiac catheterization is used to diagnose and/or treat various heart conditions. Doctors may recommend this procedure for a number of different reasons. The most common reason is to evaluate chest pain. Chest pain can be a symptom of coronary artery disease (CAD), and cardiac catheterization can show whether plaque is narrowing or blocking your heart's arteries. This procedure is also used to evaluate the valves, as well as measure the blood flow and oxygen levels in different parts of your heart. For further information please visit [facility]tn. Please follow instruction sheet, as given.  Surgical referral to [facility]  TAVR CT's (You will be called to schedule)   Follow-Up: At [facility], you and your health needs are our  priority.  As part of our continuing mission to provide you with exceptional heart care, we have created designated Provider Care Teams.  These Care Teams include your primary Cardiologist (physician) and Advanced Practice Providers (APPs -  Physician Assistants and Nurse Practitioners) who all work together to provide you with the care you need, when you need it.  Your next appointment:   Structural Team will follow-up  The format for your next appointment:   In Person  Provider:   [name], MD    Other Instructions  [place] A DEPT OF [place] [facility] DEPT OF [facility] [place], [place] [id number] [place] [address] Dept: [phone] Loc: [phone]  KAMORAH NEVILS  10/16/2021  You are scheduled for a Cardiac Catheterization on Wednesday, September 6 with Dr. [name].  1. Please arrive at the [facility] (Main Entrance A) at [facility]: [address] [place], [place] [address] at 9:30 AM (This time is two hours before your procedure to ensure your preparation). Free valet parking service is available.   Special note: Every effort is made to have your procedure done on time. Please understand that emergencies sometimes delay scheduled procedures.  2. Diet: Do not eat solid foods after midnight.  The patient may have clear liquids until 5am upon the day of the procedure.  3. Labs: Today  4. Medication instructions in preparation for your procedure:   Contrast Allergy: No  On the morning of your procedure, take your Aspirin '81mg'$  and any morning medicines NOT listed above.  You may use sips of water.  5. Plan for one night stay--bring personal belongings. 6. Bring a current list of your medications and current insurance cards. 7. You MUST have a responsible person to drive you home. 8. Someone MUST be with you the first 24 hours after you arrive home or  your discharge will be delayed. 9. Please wear clothes that are easy to get on and off and wear slip-on shoes.  Thank you for allowing [place] to care for you!   -- [facility]   Important Information About Sugar         Signed, [name], MD  10/16/2021 4:21 PM    [facility]

## 2021-10-16 NOTE — H&P (View-Only) (Signed)
Cardiology Office Note:    Date:  10/16/2021   ID:  Cheryl Cooper, DOB [birth year], MRN [id number]  PCP:  Libby Maw, [place] Providers Cardiologist:  None     Referring MD: Libby Maw,*   Chief Complaint  Patient presents with   Aortic Stenosis    History of Present Illness:    Cheryl Cooper is a 81 y.o. female presenting for TAVR evaluation, referred by Dr. Audie Box.  The patient is here alone today.  She has been followed for aortic stenosis for the past 18 months.  Her most recent echo shows marked progression from moderate to very severe aortic stenosis.  The patient's aortic valve is not well visualized but is severely thickened on my review of the study.  The peak systolic velocity is 4.8 m/s with a mean gradient of 69 mmHg and valve area of 0.46 cm with a dimensionless index of 0.18.  The patient was first told of a heart murmur just a few years ago.  She denies any history of rheumatic fever or rheumatic heart disease.  She has experienced progressive symptoms of exertional dyspnea over the past few months.  She also complains of fatigue and intermittent dizziness.  She denies chest pain or pressure.  She denies orthopnea, PND, or leg swelling.  She states that she has not been as active of late as she has been concerned about her heart.  She also has back problems and is pending back surgery, but it has been recommended that her aortic stenosis is corrected first.  She denies any dental problems.  She has an upper dental plate and has regular dental care without any recent issues.  Past Medical History:  Diagnosis Date   Anxiety    Arthritis    Cancer ([place])    Carpal tunnel syndrome    Deafness in left ear    Depression    Diabetes mellitus    diet controlled   Diverticulitis    GERD (gastroesophageal reflux disease)    Heart murmur    Hypercholesteremia    Hypertension    Hypothyroid    Memory loss    reports d/t concussion    PONV (postoperative nausea and vomiting) yrs ago, none recent   Post concussion syndrome    Stroke [facility])    reports they were silent    Past Surgical History:  Procedure Laterality Date   [place]  2010   lower   BIOPSY  01/12/2018   Procedure: BIOPSY;  Surgeon: Irving Copas., MD;  Location: [facility];  Service: Gastroenterology;;   CHOLECYSTECTOMY     ESOPHAGOGASTRODUODENOSCOPY (EGD) WITH PROPOFOL N/A 01/12/2018   Procedure: ESOPHAGOGASTRODUODENOSCOPY (EGD) WITH PROPOFOL;  Surgeon: Irving Copas., MD;  Location: [facility];  Service: Gastroenterology;  Laterality: N/A;   left elobow surgery  2003   left rotator cuff  2001   LUMBAR LAMINECTOMY/DECOMPRESSION MICRODISCECTOMY Left 12/23/2017   Procedure: Laminectomy for facet/synovial cyst - left - Lumbar three-Lumbar four;  Surgeon: Earnie Larsson, MD;  Location: [facility];  Service: Neurosurgery;  Laterality: Left;   r foot surgery  1995   REVERSE SHOULDER ARTHROPLASTY Left 10/23/2018   Procedure: REVERSE TOTAL SHOULDER ARTHROPLASTY;  Surgeon: Netta Cedars, MD;  Location: [facility];  Service: Orthopedics;  Laterality: Left;  interscalene block   REVERSE SHOULDER ARTHROPLASTY Right 04/20/2021   Procedure: REVERSE SHOULDER ARTHROPLASTY;  Surgeon: Netta Cedars, MD;  Location: [facility];  Service: Orthopedics;  Laterality: Right;  with ISB   right hand surgery  2001   right index finger surgery  2009   SHOULDER OPEN ROTATOR CUFF REPAIR  11/21/2011   Procedure: ROTATOR CUFF REPAIR SHOULDER OPEN;  Surgeon: Johnn Hai, MD;  Location: [facility];  Service: Orthopedics;  Laterality: Right;  with subacromial decompression   SHOULDER OPEN ROTATOR CUFF REPAIR Right 05/09/2016   Procedure: Right shoulder mini open revision rotator cuff repair, subacromial decompression;  Surgeon: Susa Day, MD;  [facility];  Service: Orthopedics;  Laterality: Right;   TOTAL HIP ARTHROPLASTY Right 04/24/2017    Procedure: RIGHT TOTAL HIP ARTHROPLASTY ANTERIOR APPROACH;  Surgeon: Rod Can, MD;  [facility];  Service: Orthopedics;  Laterality: Right;  Needs RNFA    Current Medications: Current Meds  Medication Sig   amLODipine (NORVASC) 5 MG tablet TAKE 1 AND 1/2 TABLET BY MOUTH DAILY   Calcium Carbonate-Vitamin D (CALCIUM-D PO) Take 2 tablets by mouth daily. CHEWABLES   Cholecalciferol 25 MCG (1000 UT) tablet Take 1,000 Units by mouth daily.   cyanocobalamin 100 MCG tablet Take 100 mcg by mouth daily.   escitalopram (LEXAPRO) 20 MG tablet TAKE ONE TABLET BY MOUTH DAILY   esomeprazole (NEXIUM) 40 MG capsule Take 1 capsule (40 mg total) by mouth 2 (two) times daily before a meal.   ezetimibe (ZETIA) 10 MG tablet TAKE ONE TABLET BY MOUTH DAILY   Ginkgo Biloba 40 MG CAPS Take 40 mg by mouth daily.   glucose blood test strip daily.   levothyroxine (SYNTHROID) 75 MCG tablet TAKE ONE TABLET BY MOUTH DAILY WITH BREAKFAST   Multiple Vitamins-Minerals (WOMENS MULTI GUMMIES PO) Take 2 tablets by mouth daily.   oxybutynin (DITROPAN XL) 15 MG 24 hr tablet Take 1 tablet (15 mg total) by mouth at bedtime.   Probiotic Product (PROBIOTIC PO) Take 1 tablet by mouth daily.   rosuvastatin (CRESTOR) 10 MG tablet TAKE ONE TABLET BY MOUTH DAILY   traMADol (ULTRAM) 50 MG tablet Take 1 tablet (50 mg total) by mouth every 6 (six) hours as needed.   traZODone (DESYREL) 50 MG tablet Take 0.5 tablets (25 mg total) by mouth at bedtime.     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  Tobacco Use   Smoking status: Never   Smokeless tobacco: Never  Vaping Use   Vaping Use: Never used   Substance and Sexual Activity   Alcohol use: Yes    Alcohol/week: 1.0 standard drink of alcohol    Types: 1 Glasses of wine per week    Comment: occas   Drug use: No   Sexual activity: Not Currently    Comment: 1st intercourse 81 yo-Fewer than 5 partners  Other Topics Concern   Not on file  Social History Narrative   Married, lives at home with husband   caffeine - 1 Coke daily   Social Determinants of Health   Financial Resource Strain: Not on file  Food Insecurity: Not on file  Transportation Needs: Not on file  Physical Activity: Not on file  Stress: Not on file  Social Connections: Not on file     Family History:  The patient's family history includes Dementia in her brother; Diabetes in her brother; Heart attack in her father and mother; Heart disease in her brother; Stroke in her brother, father, sister, and sister.  ROS:   Please see the history of present illness.    All other systems reviewed and are negative.  EKGs/Labs/Other Studies Reviewed:    The following studies were reviewed today: Echo: IMPRESSIONS     1. Very severe aortic stenosis is present. Vmax 4.8 m/s, MG 69 mmHG, AVA  0.46 cm2, DI 0.18. The aortic valve is tricuspid. There is severe  calcifcation of the aortic valve. There is severe thickening of the aortic  valve. Aortic valve regurgitation is  not visualized. Severe aortic valve stenosis.   2. Hyperdynamic LV. Intracavitary gradient 39 mmHG. Left ventricular  ejection fraction, by estimation, is 70 to 75%. The left ventricle has  hyperdynamic function. The left ventricle has no regional wall motion  abnormalities. There is moderate concentric   left ventricular hypertrophy. Left ventricular diastolic parameters are  consistent with Grade I diastolic dysfunction (impaired relaxation).   3. Right ventricular systolic function is normal. The right ventricular  size is normal. Tricuspid regurgitation signal is inadequate for assessing  PA  pressure.   4. The mitral valve is grossly normal. No evidence of mitral valve  regurgitation. No evidence of mitral stenosis.   5. The inferior vena cava is normal in size with greater than 50%  respiratory variability, suggesting right atrial pressure of 3 mmHg.  LVIDd:         3.10 cm   Diastology  LVIDs:         2.10 cm   LV e' medial:    4.24 cm/s  LV PW:         1.40 cm   LV E/e' medial:  20.6  LV IVS:        1.40 cm   LV e' lateral:   5.44 cm/s  LVOT diam:     1.80 cm   LV E/e' lateral: 16.0  LV SV:         53  LV SV Index:   32        2D Longitudinal Strain  LVOT Area:     2.54 cm  2D Strain GLS (A2C):   -25.5 %                           2D Strain GLS (A3C):   -24.8 %                           2D Strain GLS (A4C):   -22.3 %                           2D Strain GLS Avg:     -24.2 %                              3D Volume EF:                           3D EF:        55 %                           LV EDV:  97 ml                           LV ESV:       44 ml                           LV SV:        54 ml   RIGHT VENTRICLE             IVC  RV Basal diam:  2.90 cm     IVC diam: 1.00 cm  RV S prime:     12.35 cm/s  TAPSE (M-mode): 2.1 cm   LEFT ATRIUM             Index        RIGHT ATRIUM          Index  LA diam:        3.90 cm 2.37 cm/m   RA Area:     8.02 cm  LA Vol (A2C):   32.5 ml 19.76 ml/m  RA Volume:   15.40 ml 9.37 ml/m  LA Vol (A4C):   31.7 ml 19.28 ml/m  LA Biplane Vol: 32.1 ml 19.52 ml/m   AORTIC VALVE  AV Area (Vmax):    0.57 cm  AV Area (Vmean):   0.54 cm  AV Area (VTI):     0.46 cm  AV Vmax:           484.00 cm/s  AV Vmean:          354.800 cm/s  AV VTI:            1.150 m  AV Peak Grad:      93.7 mmHg  AV Mean Grad:      69.0 mmHg  LVOT Vmax:         108.00 cm/s  LVOT Vmean:        74.700 cm/s  LVOT VTI:          0.209 m  LVOT/AV VTI ratio: 0.18     AORTA  Ao Root diam: 2.80 cm  Ao Asc diam:  3.10 cm   MITRAL VALVE  MV Area (PHT): 2.73  cm     SHUNTS  MV Decel Time: 278 msec     Systemic VTI:  0.21 m  MV E velocity: 87.30 cm/s   Systemic Diam: 1.80 cm  MV A velocity: 145.00 cm/s  MV E/A ratio:  0.60   EKG:  EKG is not ordered today.  The patient's EKG from April 13, 2021 is reviewed and it shows normal sinus rhythm and is within normal limits.  There is no IVCD or significant conduction disease.  Recent Labs: 03/20/2021: ALT 40; TSH 2.24 04/21/2021: BUN 12; Creatinine, Ser 0.85; Potassium 3.8; Sodium 132 06/25/2021: Hemoglobin 13.2; Platelets 198.0  Recent Lipid Panel    Component Value Date/Time   CHOL 150 07/28/2020 0856   CHOL 147 01/21/2020 0958   TRIG 193.0 (H) 07/28/2020 0856   HDL 47.50 07/28/2020 0856   HDL 48 01/21/2020 0958   CHOLHDL 3 07/28/2020 0856   VLDL 38.6 07/28/2020 0856   LDLCALC 63 07/28/2020 0856   LDLCALC 77 01/21/2020 0958   LDLDIRECT 53.0 03/20/2021 1652     Risk Assessment/Calculations:                Physical Exam:    VS:  BP 116/70   Pulse  70   Ht 5' 3.75" (1.619 m)   Wt 145 lb 9.6 oz (66 kg)   SpO2 95%   BMI 25.19 kg/m     Wt Readings from Last 3 Encounters:  10/16/21 145 lb 9.6 oz (66 kg)  07/26/21 136 lb 9.6 oz (62 kg)  06/25/21 140 lb 3.2 oz (63.6 kg)     GEN:  Well nourished, well developed in no acute distress HEENT: Normal NECK: No JVD; No carotid bruits LYMPHATICS: No lymphadenopathy CARDIAC: RRR, 3/6 harsh crescendo decrescendo murmur, late peaking, with diminished A2 RESPIRATORY:  Clear to auscultation without rales, wheezing or rhonchi  ABDOMEN: Soft, non-tender, non-distended MUSCULOSKELETAL:  No edema; No deformity  SKIN: Warm and dry NEUROLOGIC:  Alert and oriented x 3 PSYCHIATRIC:  Normal affect   ASSESSMENT:    1. Severe aortic stenosis    PLAN:    In order of problems listed above:  The patient has severe, stage D1 aortic stenosis with New York Heart Association functional class II symptoms of exertional dyspnea and fatigue.  She has  had marked progression of her aortic stenosis over the past 6 months based on serial echo studies.  I have personally reviewed her echo study and agree with the findings outlined in the HPI and reports section of this note.  Her valve leaflets appear severely thickened and suprasternal notch imaging confirms very severe aortic stenosis with a mean transvalvular gradient greater than 60 mmHg and calculated valve area of 0.5 cm.  We discussed the natural history of aortic stenosis today and the indication for aortic valve replacement.  Considering the patient's advanced age of 81 years old, TAVR might be the least morbid and safest way to treat her.  She understands that she will need to undergo cardiac catheterization and CTA studies to better define the anatomic feasibility of TAVR and assess for concomitant coronary artery disease. I have reviewed the risks, indications, and alternatives to cardiac catheterization, possible angioplasty, and stenting with the patient. Risks include but are not limited to bleeding, infection, vascular injury, stroke, myocardial infection, arrhythmia, kidney injury, radiation-related injury in the case of prolonged fluoroscopy use, emergency cardiac surgery, and death. The patient understands the risks of serious complication is 1-2 in 2694 with diagnostic cardiac cath and 1-2% or less with angioplasty/stenting.  Once her cardiac catheterization and CTA studies are completed, she will be referred for formal cardiac surgical consultation as part of a multidisciplinary approach to her care.  I agree that her aortic stenosis should be treated before she undergoes back surgery.      Shared Decision Making/Informed Consent The risks [stroke (1 in 1000), death (1 in 1000), kidney failure [usually temporary] (1 in 500), bleeding (1 in 200), allergic reaction [possibly serious] (1 in 200)], benefits (diagnostic support and management of coronary artery disease) and alternatives of a  cardiac catheterization were discussed in detail with Cheryl Cooper and she is willing to proceed.    Medication Adjustments/Labs and Tests Ordered: Current medicines are reviewed at length with the patient today.  Concerns regarding medicines are outlined above.  No orders of the defined types were placed in this encounter.  No orders of the defined types were placed in this encounter.   Patient Instructions  Medication Instructions:  Your physician recommends that you continue on your current medications as directed. Please refer to the Current Medication list given to you today.  *If you need a refill on your cardiac medications before your next appointment, please call  your pharmacy*   Lab Work: BMET, CBC Today If you have labs (blood work) drawn today and your tests are completely normal, you will receive your results only by: [place] (if you have MyChart) OR A paper copy in the mail If you have any lab test that is abnormal or we need to change your treatment, we will call you to review the results.   Testing/Procedures: R & L heart Catheterization Your physician has requested that you have a cardiac catheterization. Cardiac catheterization is used to diagnose and/or treat various heart conditions. Doctors may recommend this procedure for a number of different reasons. The most common reason is to evaluate chest pain. Chest pain can be a symptom of coronary artery disease (CAD), and cardiac catheterization can show whether plaque is narrowing or blocking your heart's arteries. This procedure is also used to evaluate the valves, as well as measure the blood flow and oxygen levels in different parts of your heart. For further information please visit HugeFiesta.tn. Please follow instruction sheet, as given.  Surgical referral to [facility]  TAVR CT's (You will be called to schedule)   Follow-Up: At [facility], you and your health needs are our  priority.  As part of our continuing mission to provide you with exceptional heart care, we have created designated Provider Care Teams.  These Care Teams include your primary Cardiologist (physician) and Advanced Practice Providers (APPs -  Physician Assistants and Nurse Practitioners) who all work together to provide you with the care you need, when you need it.  Your next appointment:   Structural Team will follow-up  The format for your next appointment:   In Person  Provider:   [name], MD    Other Instructions  [place] A DEPT OF [place] [facility] [place], [place] [id number] [place] [address] Dept: [phone] Loc: [phone]  Cheryl Cooper  10/16/2021  You are scheduled for a Cardiac Catheterization on Wednesday, September 6 with Dr. [name].  1. Please arrive at the [facility] (Main Entrance A) at [facility]: [address] [place], [place] [address] at 9:30 AM (This time is two hours before your procedure to ensure your preparation). Free valet parking service is available.   Special note: Every effort is made to have your procedure done on time. Please understand that emergencies sometimes delay scheduled procedures.  2. Diet: Do not eat solid foods after midnight.  The patient may have clear liquids until 5am upon the day of the procedure.  3. Labs: Today  4. Medication instructions in preparation for your procedure:   Contrast Allergy: No  On the morning of your procedure, take your Aspirin '81mg'$  and any morning medicines NOT listed above.  You may use sips of water.  5. Plan for one night stay--bring personal belongings. 6. Bring a current list of your medications and current insurance cards. 7. You MUST have a responsible person to drive you home. 8. Someone MUST be with you the first 24 hours after you arrive home or  your discharge will be delayed. 9. Please wear clothes that are easy to get on and off and wear slip-on shoes.  Thank you for allowing [place] to care for you!   -- [place] [facility]   Important Information About Sugar         Signed, [name], MD  10/16/2021 4:21 PM    [facility]

## 2021-10-16 NOTE — Patient Instructions (Addendum)
Medication Instructions:  Your physician recommends that you continue on your current medications as directed. Please refer to the Current Medication list given to you today.  *If you need a refill on your cardiac medications before your next appointment, please call your pharmacy*   Lab Work: BMET, CBC Today If you have labs (blood work) drawn today and your tests are completely normal, you will receive your results only by: [place] (if you have MyChart) OR A paper copy in the mail If you have any lab test that is abnormal or we need to change your treatment, we will call you to review the results.   Testing/Procedures: R & L heart Catheterization Your physician has requested that you have a cardiac catheterization. Cardiac catheterization is used to diagnose and/or treat various heart conditions. Doctors may recommend this procedure for a number of different reasons. The most common reason is to evaluate chest pain. Chest pain can be a symptom of coronary artery disease (CAD), and cardiac catheterization can show whether plaque is narrowing or blocking your heart's arteries. This procedure is also used to evaluate the valves, as well as measure the blood flow and oxygen levels in different parts of your heart. For further information please visit HugeFiesta.tn. Please follow instruction sheet, as given.  Surgical referral to [facility]  TAVR CT's (You will be called to schedule)   Follow-Up: At [facility], you and your health needs are our priority.  As part of our continuing mission to provide you with exceptional heart care, we have created designated Provider Care Teams.  These Care Teams include your primary Cardiologist (physician) and Advanced Practice Providers (APPs -  Physician Assistants and Nurse Practitioners) who all work together to provide you with the care you need, when you need it.  Your next appointment:   Structural Team will  follow-up  The format for your next appointment:   In Person  Provider:   [name], MD    Other Instructions  [place] A DEPT OF [place] [facility] [place], [place] [id number] [place] [address] Dept: [phone] Loc: [phone]  ZORANA BROCKWELL  10/16/2021  You are scheduled for a Cardiac Catheterization on Wednesday, September 6 with Dr. [name].  1. Please arrive at the [facility] (Main Entrance A) at [facility]: [address] [place],  [address] at 9:30 AM (This time is two hours before your procedure to ensure your preparation). Free valet parking service is available.   Special note: Every effort is made to have your procedure done on time. Please understand that emergencies sometimes delay scheduled procedures.  2. Diet: Do not eat solid foods after midnight.  The patient may have clear liquids until 5am upon the day of the procedure.  3. Labs: Today  4. Medication instructions in preparation for your procedure:   Contrast Allergy: No  On the morning of your procedure, take your Aspirin '81mg'$  and any morning medicines.  You may use sips of water.  5. Plan for one night stay--bring personal belongings. 6. Bring a current list of your medications and current insurance cards. 7. You MUST have a responsible person to drive you home. 8. Someone MUST be with you the first 24 hours after you arrive home or your discharge will be delayed. 9. Please wear clothes that are easy to get on and off and wear slip-on  shoes.  Thank you for allowing [place] to care for you!   -- [place] [facility]   Important Information About Sugar

## 2021-10-16 NOTE — Progress Notes (Signed)
Pre Surgical Assessment: 5 M Walk Test  64M=16.35f  5 Meter Walk Test- trial 1: 6.96 seconds 5 Meter Walk Test- trial 2: 8.55  seconds 5 Meter Walk Test- trial 3: 8.01 seconds 5 Meter Walk Test Average: 7.84 seconds

## 2021-10-17 ENCOUNTER — Other Ambulatory Visit: Payer: Self-pay

## 2021-10-17 DIAGNOSIS — I350 Nonrheumatic aortic (valve) stenosis: Secondary | ICD-10-CM

## 2021-10-17 LAB — BASIC METABOLIC PANEL
BUN/Creatinine Ratio: 14 (ref 12–28)
BUN: 12 mg/dL (ref 8–27)
CO2: 26 mmol/L (ref 20–29)
Calcium: 9.1 mg/dL (ref 8.7–10.3)
Chloride: 100 mmol/L (ref 96–106)
Creatinine, Ser: 0.83 mg/dL (ref 0.57–1.00)
Glucose: 116 mg/dL — ABNORMAL HIGH (ref 70–99)
Potassium: 4 mmol/L (ref 3.5–5.2)
Sodium: 138 mmol/L (ref 134–144)
eGFR: 72 mL/min/{1.73_m2} (ref >59)

## 2021-10-17 LAB — CBC
Hematocrit: 37.5 % (ref 34.0–46.6)
Hemoglobin: 12.8 g/dL (ref 11.1–15.9)
MCH: 30.9 pg (ref 26.6–33.0)
MCHC: 34.1 g/dL (ref 31.5–35.7)
MCV: 91 fL (ref 79–97)
Platelets: 195 10*3/uL (ref 150–450)
RBC: 4.14 x10E6/uL (ref 3.77–5.28)
RDW: 14 % (ref 11.7–15.4)
WBC: 8.4 10*3/uL (ref 3.4–10.8)

## 2021-10-23 ENCOUNTER — Telehealth: Payer: Self-pay | Admitting: *Deleted

## 2021-10-23 DIAGNOSIS — M25511 Pain in right shoulder: Secondary | ICD-10-CM | POA: Diagnosis not present

## 2021-10-23 DIAGNOSIS — Z96611 Presence of right artificial shoulder joint: Secondary | ICD-10-CM | POA: Diagnosis not present

## 2021-10-23 NOTE — Telephone Encounter (Signed)
Cardiac Catheterization scheduled at [facility] for: Wednesday October 24, 2021 11:30 AM Arrival time and place: [place] Entrance A at: 9:30 AM  Nothing to eat after midnight prior to procedure, clear liquids until 5 AM day of procedure.  Medication instructions: -Usual morning medications can be taken with sips of water including aspirin 81 mg.  Confirmed patient has responsible adult to drive home post procedure and be with patient first 24 hours after arriving home.  Patient reports no new symptoms concerning for COVID-19 in the past 10 days.   Reviewed procedure instructions with patient.

## 2021-10-23 NOTE — Telephone Encounter (Signed)
Entry error

## 2021-10-24 ENCOUNTER — Ambulatory Visit (HOSPITAL_COMMUNITY)
Admission: RE | Admit: 2021-10-24 | Discharge: 2021-10-24 | Disposition: A | Discharge status: Home / Self Care | Payer: Medicare PPO | Attending: Cardiovascular Disease | Admitting: Cardiovascular Disease

## 2021-10-24 ENCOUNTER — Encounter (HOSPITAL_COMMUNITY)
Admission: RE | Disposition: A | Discharge status: Home / Self Care | Payer: Medicare PPO | Source: Home / Self Care | Attending: Cardiovascular Disease

## 2021-10-24 ENCOUNTER — Other Ambulatory Visit: Payer: Self-pay

## 2021-10-24 DIAGNOSIS — I2510 Atherosclerotic heart disease of native coronary artery without angina pectoris: Secondary | ICD-10-CM | POA: Diagnosis not present

## 2021-10-24 DIAGNOSIS — I2584 Coronary atherosclerosis due to calcified coronary lesion: Secondary | ICD-10-CM | POA: Diagnosis not present

## 2021-10-24 DIAGNOSIS — I350 Nonrheumatic aortic (valve) stenosis: Secondary | ICD-10-CM | POA: Diagnosis not present

## 2021-10-24 HISTORY — PX: RIGHT HEART CATH AND CORONARY ANGIOGRAPHY: CATH118264

## 2021-10-24 LAB — POCT I-STAT 7, (LYTES, BLD GAS, ICA,H+H)
Acid-Base Excess: 1 mmol/L (ref 0.0–2.0)
Bicarbonate: 25.8 mmol/L (ref 20.0–28.0)
Calcium, Ion: 1.21 mmol/L (ref 1.15–1.40)
HCT: 36 % (ref 36.0–46.0)
Hemoglobin: 12.2 g/dL (ref 12.0–15.0)
O2 Saturation: 94 %
Potassium: 3.7 mmol/L (ref 3.5–5.1)
Sodium: 140 mmol/L (ref 135–145)
TCO2: 27 mmol/L (ref 22–32)
pCO2 arterial: 42.9 mmHg (ref 32–48)
pH, Arterial: 7.387 (ref 7.35–7.45)
pO2, Arterial: 71 mmHg — ABNORMAL LOW (ref 83–108)

## 2021-10-24 LAB — POCT I-STAT EG7
Acid-Base Excess: 0 mmol/L (ref 0.0–2.0)
Bicarbonate: 25.1 mmol/L (ref 20.0–28.0)
Calcium, Ion: 1.14 mmol/L — ABNORMAL LOW (ref 1.15–1.40)
HCT: 37 % (ref 36.0–46.0)
Hemoglobin: 12.6 g/dL (ref 12.0–15.0)
O2 Saturation: 72 %
Potassium: 3.6 mmol/L (ref 3.5–5.1)
Sodium: 142 mmol/L (ref 135–145)
TCO2: 26 mmol/L (ref 22–32)
pCO2, Ven: 42.5 mmHg — ABNORMAL LOW (ref 44–60)
pH, Ven: 7.38 (ref 7.25–7.43)
pO2, Ven: 39 mmHg (ref 32–45)

## 2021-10-24 LAB — GLUCOSE, CAPILLARY: Glucose-Capillary: 125 mg/dL — ABNORMAL HIGH (ref 70–99)

## 2021-10-24 SURGERY — RIGHT HEART CATH AND CORONARY ANGIOGRAPHY
Anesthesia: LOCAL

## 2021-10-24 MED ORDER — HYDRALAZINE HCL 20 MG/ML IJ SOLN: 10.0000 mg | INTRAMUSCULAR | Status: DC | PRN

## 2021-10-24 MED ORDER — IOHEXOL 350 MG/ML SOLN
INTRAVENOUS | Status: DC | PRN
  Administered 2021-10-24: 55 mL

## 2021-10-24 MED ORDER — HEPARIN (PORCINE) IN NACL 1000-0.9 UT/500ML-% IV SOLN
INTRAVENOUS | Status: DC | PRN
  Administered 2021-10-24 (×2): 500 mL

## 2021-10-24 MED ORDER — SODIUM CHLORIDE 0.9% FLUSH: 3.0000 mL | INTRAVENOUS | Status: DC | PRN

## 2021-10-24 MED ORDER — SODIUM CHLORIDE 0.9 % WEIGHT BASED INFUSION: 1.0000 mL/kg/h | INTRAVENOUS | Status: DC

## 2021-10-24 MED ORDER — VERAPAMIL HCL 2.5 MG/ML IV SOLN
INTRAVENOUS | Status: AC
  Filled 2021-10-24: qty 2

## 2021-10-24 MED ORDER — SODIUM CHLORIDE 0.9% FLUSH: 3.0000 mL | Freq: Two times a day (BID) | INTRAVENOUS | Status: DC

## 2021-10-24 MED ORDER — HEPARIN SODIUM (PORCINE) 1000 UNIT/ML IJ SOLN
INTRAMUSCULAR | Status: DC | PRN
  Administered 2021-10-24: 4000 [IU] via INTRAVENOUS

## 2021-10-24 MED ORDER — ACETAMINOPHEN 325 MG PO TABS: 650.0000 mg | ORAL_TABLET | ORAL | Status: DC | PRN

## 2021-10-24 MED ORDER — SODIUM CHLORIDE 0.9 % WEIGHT BASED INFUSION
1.0000 mL/kg/h | INTRAVENOUS | Status: DC
Start: 2021-10-24 — End: 2021-10-24

## 2021-10-24 MED ORDER — FENTANYL CITRATE (PF) 100 MCG/2ML IJ SOLN
INTRAMUSCULAR | Status: AC
  Filled 2021-10-24: qty 2

## 2021-10-24 MED ORDER — SODIUM CHLORIDE 0.9 % IV SOLN: 250.0000 mL | INTRAVENOUS | Status: DC | PRN

## 2021-10-24 MED ORDER — LABETALOL HCL 5 MG/ML IV SOLN
10.0000 mg | INTRAVENOUS | Status: DC | PRN
  Administered 2021-10-24: 10 mg via INTRAVENOUS
  Filled 2021-10-24: qty 4

## 2021-10-24 MED ORDER — VERAPAMIL HCL 2.5 MG/ML IV SOLN
INTRAVENOUS | Status: DC | PRN
  Administered 2021-10-24: 10 mL via INTRA_ARTERIAL

## 2021-10-24 MED ORDER — ASPIRIN 81 MG PO CHEW: 81.0000 mg | CHEWABLE_TABLET | ORAL | Status: DC

## 2021-10-24 MED ORDER — HEPARIN SODIUM (PORCINE) 1000 UNIT/ML IJ SOLN
INTRAMUSCULAR | Status: AC
  Filled 2021-10-24: qty 10

## 2021-10-24 MED ORDER — SODIUM CHLORIDE 0.9 % IV SOLN
250.0000 mL | INTRAVENOUS | Status: DC | PRN
Start: 2021-10-24 — End: 2021-10-24

## 2021-10-24 MED ORDER — LIDOCAINE HCL (PF) 1 % IJ SOLN
INTRAMUSCULAR | Status: AC
  Filled 2021-10-24: qty 30

## 2021-10-24 MED ORDER — FENTANYL CITRATE (PF) 100 MCG/2ML IJ SOLN
INTRAMUSCULAR | Status: DC | PRN
  Administered 2021-10-24: 25 ug via INTRAVENOUS

## 2021-10-24 MED ORDER — HEPARIN (PORCINE) IN NACL 1000-0.9 UT/500ML-% IV SOLN
INTRAVENOUS | Status: AC
  Filled 2021-10-24: qty 500

## 2021-10-24 MED ORDER — ONDANSETRON HCL 4 MG/2ML IJ SOLN: 4.0000 mg | Freq: Four times a day (QID) | INTRAMUSCULAR | Status: DC | PRN

## 2021-10-24 MED ORDER — MIDAZOLAM HCL 2 MG/2ML IJ SOLN
INTRAMUSCULAR | Status: DC | PRN
  Administered 2021-10-24: 2 mg via INTRAVENOUS

## 2021-10-24 MED ORDER — MIDAZOLAM HCL 2 MG/2ML IJ SOLN
INTRAMUSCULAR | Status: AC
  Filled 2021-10-24: qty 2

## 2021-10-24 MED ORDER — LIDOCAINE HCL (PF) 1 % IJ SOLN
INTRAMUSCULAR | Status: DC | PRN
  Administered 2021-10-24 (×2): 5 mL

## 2021-10-24 MED ORDER — SODIUM CHLORIDE 0.9 % WEIGHT BASED INFUSION
3.0000 mL/kg/h | INTRAVENOUS | Status: AC
  Administered 2021-10-24: 3 mL/kg/h via INTRAVENOUS

## 2021-10-24 SURGICAL SUPPLY — 12 items
BAND ZEPHYR COMPRESS 30 LONG (HEMOSTASIS) IMPLANT
CATH 5FR JL3.5 JR4 ANG PIG MP (CATHETERS) IMPLANT
CATH BALLN WEDGE 5F 110CM (CATHETERS) IMPLANT
CATH INFINITI 5 FR 3DRC (CATHETERS) IMPLANT
GLIDESHEATH SLEND SS 6F .021 (SHEATH) IMPLANT
GUIDEWIRE INQWIRE 1.5J.035X260 (WIRE) IMPLANT
INQWIRE 1.5J .035X260CM (WIRE) ×1
KIT HEART LEFT (KITS) ×1 IMPLANT
PACK CARDIAC CATHETERIZATION (CUSTOM PROCEDURE TRAY) ×1 IMPLANT
SHEATH GLIDE SLENDER 4/5FR (SHEATH) IMPLANT
TRANSDUCER W/STOPCOCK (MISCELLANEOUS) ×1 IMPLANT
TUBING CIL FLEX 10 FLL-RA (TUBING) ×1 IMPLANT

## 2021-10-24 NOTE — Interval H&P Note (Signed)
History and Physical Interval Note:  10/24/2021 11:42 AM  Cheryl Cooper  has presented today for surgery, with the diagnosis of aortic stenosis.  The various methods of treatment have been discussed with the patient and family. After consideration of risks, benefits and other options for treatment, the patient has consented to  Procedure(s): RIGHT/LEFT HEART CATH AND CORONARY ANGIOGRAPHY (N/A) as a surgical intervention.  The patient's history has been reviewed, patient examined, no change in status, stable for surgery.  I have reviewed the patient's chart and labs.  Questions were answered to the patient's satisfaction.     [name]

## 2021-10-25 ENCOUNTER — Encounter (HOSPITAL_COMMUNITY): Payer: Self-pay | Admitting: Cardiovascular Disease

## 2021-10-25 ENCOUNTER — Telehealth: Payer: Self-pay | Admitting: Cardiovascular Disease

## 2021-10-25 NOTE — Telephone Encounter (Signed)
   Pre-operative Risk Assessment    Patient Name: Cheryl Cooper  DOB: 1941/01/08 MRN: [id number]     Request for Surgical Clearance    Procedure:   Crown work    Date of Surgery:  Clearance 11/06/21                                 Surgeon:  Dr. Freda Munro Group or Practice Name:  Dr. Joycie Peek DDS  Phone number:  [phone] Fax number:  [phone]   Type of Clearance Requested:   - Medical    Type of Anesthesia:   Lidocaine   Additional requests/questions:    Signed, April Henson   10/25/2021, 3:32 PM

## 2021-10-25 NOTE — Telephone Encounter (Signed)
Operator contacted me to advise dental office was on the line requesting to speak with someone. I called back and spoke with Cheryl Cooper at Dr. Mee Hives' office. Patient is undergoing crown work to be completed by 9/19 as they were advised she needs to have dental work completed prior to TAVR. I advised that there are no restrictions for upcoming dental work. Cheryl Cooper thanked me for the call.  Cheryl Life, NP-C  10/25/2021, 3:51 PM [address], [address] Office [phone] Fax [phone] '

## 2021-10-29 NOTE — Progress Notes (Signed)
Procedure Type: Isolated AVR Perioperative Outcome Estimate % Operative Mortality 2.53% Morbidity & Mortality 6.94% Stroke 1.59% Renal Failure 0.742% Reoperation 3.29% Prolonged Ventilation 3.41% Deep Sternal Wound Infection 0.03% [facility] Stay (>14 days) 3.91% Short Hospital Stay (<6 days)* 47%

## 2021-11-01 ENCOUNTER — Encounter (HOSPITAL_COMMUNITY): Payer: Self-pay

## 2021-11-01 ENCOUNTER — Ambulatory Visit (HOSPITAL_COMMUNITY)
Admission: RE | Admit: 2021-11-01 | Discharge: 2021-11-01 | Disposition: A | Discharge status: Home / Self Care | Payer: Medicare PPO | Source: Ambulatory Visit | Attending: Cardiovascular Disease | Admitting: Cardiovascular Disease

## 2021-11-01 DIAGNOSIS — I350 Nonrheumatic aortic (valve) stenosis: Secondary | ICD-10-CM | POA: Diagnosis not present

## 2021-11-01 DIAGNOSIS — I712 Thoracic aortic aneurysm, without rupture, unspecified: Secondary | ICD-10-CM | POA: Diagnosis not present

## 2021-11-01 DIAGNOSIS — Z01818 Encounter for other preprocedural examination: Secondary | ICD-10-CM | POA: Diagnosis not present

## 2021-11-01 DIAGNOSIS — I2510 Atherosclerotic heart disease of native coronary artery without angina pectoris: Secondary | ICD-10-CM | POA: Diagnosis not present

## 2021-11-01 MED ORDER — IOHEXOL 350 MG/ML SOLN
95.0000 mL | Freq: Once | INTRAVENOUS | Status: AC | PRN
  Administered 2021-11-01: 95 mL via INTRAVENOUS

## 2021-11-02 ENCOUNTER — Telehealth: Payer: Self-pay | Admitting: Family Medicine

## 2021-11-02 ENCOUNTER — Other Ambulatory Visit: Payer: Self-pay | Admitting: Family Medicine

## 2021-11-02 DIAGNOSIS — F32A Depression, unspecified: Secondary | ICD-10-CM

## 2021-11-02 DIAGNOSIS — E039 Hypothyroidism, unspecified: Secondary | ICD-10-CM

## 2021-11-02 MED ORDER — ESCITALOPRAM OXALATE 20 MG PO TABS: 20.0000 mg | ORAL_TABLET | Freq: Every day | ORAL | 0 refills | Status: DC

## 2021-11-02 NOTE — Telephone Encounter (Signed)
Caller Name: pt Call back phone #: [phone]   MEDICATION(S):  escitalopram (LEXAPRO) 20 MG tablet [[id number]]   Days of Med Remaining: 1 left Has the patient contacted their pharmacy (YES/NO)? Yes contact your PCP   Preferred Pharmacy: [id number] [place], [place] - [place]  [address] [place], [place] [address]  Phone:  [phone]  Fax:  [phone]

## 2021-11-05 ENCOUNTER — Institutional Professional Consult (permissible substitution): Payer: Medicare PPO | Admitting: Thoracic Surgery (Cardiothoracic Vascular Surgery)

## 2021-11-05 ENCOUNTER — Encounter: Payer: Self-pay | Admitting: Thoracic Surgery (Cardiothoracic Vascular Surgery)

## 2021-11-05 VITALS — BP 138/76 | HR 66 | Resp 18 | Ht 63.0 in | Wt 137.0 lb

## 2021-11-05 DIAGNOSIS — I350 Nonrheumatic aortic (valve) stenosis: Secondary | ICD-10-CM | POA: Diagnosis not present

## 2021-11-05 NOTE — Patient Instructions (Signed)
Schedule for TAVR 

## 2021-11-05 NOTE — Progress Notes (Unsigned)
PCP is Libby Maw, MD Referring Provider is O'Neal, Cassie Freer, *  No chief complaint on file.   HPI: 81 yo wf who was told she had a heart murmur in the past 2 years who has been suffering from progressive DOE and more so Fatigue  over the past few months. She denies CP or palpitations. She has ongoing back pain and was felt to require back surgery but in work up of her murmur she was found to have aortic stenosis that has progressed quite significantly from her previous echos. She has now a mean gradient of 36mHg and with an AVA of 0.46cm2, and velocity pk of 4.869mec. She has normal LV function. She underwent cardiac cath and was found not to have CAD and was felt to be a TAVR candidate after visit with Dr CoBurt Knacknd had TAVR CTA which has shown acceptable anatomy. Pt now presents for surgical evaluation  Past Medical History:  Diagnosis Date   Anxiety    Arthritis    [place]   Carpal tunnel syndrome    Deafness in left ear    Depression    Diabetes mellitus    diet controlled   Diverticulitis    GERD (gastroesophageal reflux disease)    Heart murmur    Hypercholesteremia    Hypertension    Hypothyroid    Memory loss    reports d/t concussion   PONV (postoperative nausea and vomiting) yrs ago, none recent   Post concussion syndrome    [facility]   reports they were silent    Past Surgical History:  [place]   [place]   BIOPSY  01/12/2018   Procedure: BIOPSY;  Surgeon: MaIrving Copas MD;  [facility];  Service: Gastroenterology;;   CHOLECYSTECTOMY     ESOPHAGOGASTRODUODENOSCOPY (EGD) WITH PROPOFOL N/A 01/12/2018   Procedure: ESOPHAGOGASTRODUODENOSCOPY (EGD) WITH PROPOFOL;  Surgeon: MaIrving Copas MD;  [facility];  Service: Gastroenterology;  Laterality: N/A;   left elobow surgery  2003   left rotator cuff  2001   LUMBAR  LAMINECTOMY/DECOMPRESSION MICRODISCECTOMY Left 12/23/2017   Procedure: Laminectomy for facet/synovial cyst - left - Lumbar three-Lumbar four;  Surgeon: PoEarnie LarssonMD;  [facility]: [facility];  Laterality: Left;   r foot surgery  1995   REVERSE SHOULDER ARTHROPLASTY Left 10/23/2018   Procedure: REVERSE TOTAL SHOULDER ARTHROPLASTY;  Surgeon: NoNetta CedarsMD;  Location: [facility];  Service: Orthopedics;  Laterality: Left;  interscalene block   REVERSE SHOULDER ARTHROPLASTY Right 04/20/2021   Procedure: REVERSE SHOULDER ARTHROPLASTY;  Surgeon: NoNetta CedarsMD;  Location[facility];  Service: Orthopedics;  Laterality: Right;  with ISB   right hand surgery  2001   RIGHT HEART CATH AND CORONARY ANGIOGRAPHY N/A 10/24/2021   Procedure: RIGHT HEART CATH AND CORONARY ANGIOGRAPHY;  Surgeon: CoSherren MochaMD;  [facility];  Service: Cardiovascular;  Laterality: N/A;   right index finger surgery  2009   SHOULDER OPEN ROTATOR CUFF REPAIR  11/21/2011   Procedure: ROTATOR CUFF REPAIR SHOULDER OPEN;  Surgeon: JeJohnn HaiMD;  Location: [facility];  Service: Orthopedics;  Laterality: Right;  with subacromial decompression   SHOULDER OPEN ROTATOR CUFF REPAIR Right 05/09/2016   Procedure: Right shoulder mini open revision rotator cuff repair, subacromial decompression;  Surgeon: JeSusa DayMD;  Location[facility];  Service: Orthopedics;  Laterality: Right;  TOTAL HIP ARTHROPLASTY Right 04/24/2017   Procedure: RIGHT TOTAL HIP ARTHROPLASTY ANTERIOR APPROACH;  Surgeon: Rod Can, MD;  Location[facility];  Service: Orthopedics;  Laterality: Right;  Needs RNFA    Family History  Problem Relation Age of Onset   Heart attack Mother    Stroke Father    Heart attack Father    Stroke Sister    Stroke Brother    Diabetes Brother    Dementia Brother    Heart disease Brother    Stroke Sister        TIA    Social History Social History   Tobacco Use   Smoking status: Never    Smokeless tobacco: Never  Vaping Use   Vaping Use: Never used  Substance Use Topics   Alcohol use: Yes    Alcohol/week: 1.0 standard drink of alcohol    Types: 1 Glasses of wine per week    Comment: occas   Drug use: No    Current Outpatient Medications  Medication Sig Dispense Refill   amLODipine (NORVASC) 5 MG tablet TAKE 1 AND 1/2 TABLET BY MOUTH DAILY 135 tablet 1   Calcium Carbonate-Vitamin D (CALCIUM-D PO) Take 2 tablets by mouth daily. CHEWABLES     Cholecalciferol 50 MCG (2000 UT) TABS Take 2,000 Units by mouth in the morning.     donepezil (ARICEPT) 5 MG tablet Take 1 tablet (5 mg total) by mouth at bedtime. (Patient not taking: Reported on 10/16/2021) 90 tablet 0   escitalopram (LEXAPRO) 20 MG tablet Take 1 tablet (20 mg total) by mouth daily. 90 tablet 0   esomeprazole (NEXIUM) 40 MG capsule Take 1 capsule (40 mg total) by mouth 2 (two) times daily before a meal. 60 capsule 0   ezetimibe (ZETIA) 10 MG tablet TAKE ONE TABLET BY MOUTH DAILY 90 tablet 2   Ferrous Sulfate Dried (FERROUS SULFATE IRON PO) Take 1 tablet by mouth in the morning.     GINKGO BILOBA PO Take 1 capsule by mouth in the morning.     glucose blood test strip daily.     Lactobacillus (DIGESTIVE HEALTH PROBIOTIC PO) Take 2 capsules by mouth in the morning.     levothyroxine (SYNTHROID) 75 MCG tablet TAKE ONE TABLET BY MOUTH DAILY WITH BREAKFAST 90 tablet 2   Multiple Vitamins-Minerals (WOMENS MULTI GUMMIES PO) Take 2 tablets by mouth daily.     oxybutynin (DITROPAN XL) 15 MG 24 hr tablet Take 1 tablet (15 mg total) by mouth at bedtime. (Patient not taking: Reported on 10/17/2021) 30 tablet 2   rosuvastatin (CRESTOR) 10 MG tablet TAKE ONE TABLET BY MOUTH DAILY 90 tablet 3   traMADol (ULTRAM) 50 MG tablet Take 1 tablet (50 mg total) by mouth every 6 (six) hours as needed. 30 tablet 0   traZODone (DESYREL) 50 MG tablet Take 0.5 tablets (25 mg total) by mouth at bedtime. 45 tablet 1   No current  facility-administered medications for this visit.    Allergies  Allergen Reactions   Other Other (See Comments) and Rash    Tomato sauce, garlic, onion - severe acid reflux    Flexeril [Cyclobenzaprine] Other (See Comments)    Per spouse "she felt like a zombie"   Chlordiazepoxide-Clidinium Other (See Comments)    Dizziness (intolerance)   Codeine Nausea Only and Rash   Lipitor [Atorvastatin] Other (See Comments)    Unbalanced     There were no vitals taken for this visit. Physical Exam: HEENT: teeth in good  repair Cardiac: RR with 3/6 high pitched systolic murmur Lungs: overall clear Ext: no edema  Diagnostic Tests: I have personally visualized and reviewed the following studies and agree with their findings  TTE (09/2021) IMPRESSIONS    1. Very severe aortic stenosis is present. Vmax 4.8 m/s, MG 69 mmHG, AVA  0.46 cm2, DI 0.18. The aortic valve is tricuspid. There is severe  calcifcation of the aortic valve. There is severe thickening of the aortic  valve. Aortic valve regurgitation is  not visualized. Severe aortic valve stenosis.   2. Hyperdynamic LV. Intracavitary gradient 39 mmHG. Left ventricular  ejection fraction, by estimation, is 70 to 75%. The left ventricle has  hyperdynamic function. The left ventricle has no regional wall motion  abnormalities. There is moderate concentric   left ventricular hypertrophy. Left ventricular diastolic parameters are  consistent with Grade I diastolic dysfunction (impaired relaxation).   3. Right ventricular systolic function is normal. The right ventricular  size is normal. Tricuspid regurgitation signal is inadequate for assessing  PA pressure.   4. The mitral valve is grossly normal. No evidence of mitral valve  regurgitation. No evidence of mitral stenosis.   5. The inferior vena cava is normal in size with greater than 50%  respiratory variability, suggesting right atrial pressure of 3 mmHg.   Cardiac Cath  (10/2021) Conclusion      1st Diag lesion is 60% stenosed.   1.  Patent coronary arteries (right dominant) with a moderate ostial diagonal stenosis and no other significant coronary artery disease 2.  Normal right heart pressures and cardiac output 3.  Severe calcification and restriction of the aortic valve leaflets seen on plain fluoroscopy with known severe aortic stenosis.  Severe calcification also identified in the aortic root  CTA ( 10/2021) FINDINGS: Image quality: Excellent.   Noise artifact is: Limited.   Valve Morphology: Bicuspid aortic valve with fusion of the RCC/LCC with raphe (Sievers type 1). Severe calcifications of the leaflets with severely restricted movement in systole.   Aortic Valve Calcium score: 1808   Aortic annular dimension:   Phase assessed: 15%   Annular area: 401 mm2   Annular perimeter: 73.4 mm   Max diameter: 26.6 mm   Min diameter: 19.7 mm   Annular and subannular calcification: Minimal calcification under the RCC.   Membranous septum length: 5.0 mm   Optimal coplanar projection: RAO 5 CAU 7   Coronary Artery Height above Annulus:   Left Main: 11.6 mm   Right Coronary: 16.0 mm   Sinus of Valsalva Measurements:   Non-coronary: 28.5 mm   Right-coronary: 27.0 mm   Left-coronary: 28.0 mm   Sinus of Valsalva Height:   Non-coronary: 20.4 mm   Right-coronary: 19.6 mm   Left-coronary: 17.8 mm   Sinotubular Junction: 26 mm   Ascending Thoracic Aorta: 30 mm   Coronary Arteries: Normal coronary origin. Right dominance. The study was performed without use of NTG and is insufficient for plaque evaluation. Please refer to recent cardiac catheterization for coronary assessment. Coronary calcifications noted in the LAD/LCX.   Cardiac Morphology:   Right Atrium: Right atrial size is within normal limits.   Right Ventricle: The right ventricular cavity is within normal limits.   Left Atrium: Left atrial size is normal in  size with no left atrial appendage filling defect.   Left Ventricle: The ventricular cavity size is within normal limits.   Pulmonary arteries: Normal in size without proximal filling defect.   Pulmonary veins: Normal pulmonary venous  drainage.   Pericardium: Normal thickness with no significant effusion or calcium present.   Mitral Valve: The mitral valve is degenerative with moderate annular calcium.   Extra-cardiac findings: See attached radiology report for non-cardiac structures.   IMPRESSION: 1. Bicuspid aortic valve with fusion of the RCC/LCC with raphe (Sievers type 1).   2. Annular measurements support a 23 mm S3 (401 mm2). Would recommend against 29 mm Evlot as sinuses do not support this device.   3. No significant annular or subannular calcifications.   4. Sufficient coronary to annulus distance.   5. Optimal Fluoroscopic Angle for Delivery: RAO 5 CAU [place]. O'Neal, MD Impression: Severe Aortic Stenosis  Pt with  NYHA class II symptoms with documented severe AS and normal LV function with no CAD or EKG changes. She has a class I indication for AVR and secondary to her age and comorbidities I feel she is an excellent TAVR candidate with surgical bail out if necessary. Pt understands the risks of arterial injury, stroke, need for PPM and aortic injury which may require surgical repair and she understands and wishes to proceed. We discussed obtaining her covid and flu vaccines now before TAVR and that usually 6 weeks before she could consider having her back surgery at the earliest I have spent 65 min in reviewing records, visualizing studies, face to face with pt and in coordinating future care.   Plan: TAVR with full surgical back up.   Coralie Common, MD [facility] [phone]

## 2021-11-07 ENCOUNTER — Other Ambulatory Visit: Payer: Self-pay

## 2021-11-07 DIAGNOSIS — I350 Nonrheumatic aortic (valve) stenosis: Secondary | ICD-10-CM

## 2021-11-08 NOTE — Pre-Procedure Instructions (Signed)
Surgical Instructions    Your procedure is scheduled on Tuesday September 26.  Report to [facility] Main Entrance "A" at 5:30 A.M., then check in with the Admitting office.  Call this number if you have problems the morning of surgery:  [phone]   If you have any questions prior to your surgery date call [phone]: Open Monday-Friday 8am-4pm    Remember:  Do not eat after midnight the night before your surgery   Medication instructions per your surgeon:  STOP now, 9/20,  taking any Aspirin (unless otherwise instructed by your surgeon), Aleve, Naproxen, Ibuprofen, Motrin, Advil, Goody's, BC's, all herbal medications, fish oil, and all non-prescription vitamins.      Continue taking all other medications without change through the day before surgery. On the morning of surgery take only Levothyroxine with a sip of water.          WHAT DO I DO ABOUT MY DIABETES MEDICATION?   Do not take oral diabetes medicines (pills) the morning of surgery.   The day of surgery, do not take other diabetes injectables, including Byetta (exenatide), Bydureon (exenatide ER), Victoza (liraglutide), or Trulicity (dulaglutide).    HOW TO MANAGE YOUR DIABETES BEFORE AND AFTER SURGERY  Why is it important to control my blood sugar before and after surgery? Improving blood sugar levels before and after surgery helps healing and can limit problems. A way of improving blood sugar control is eating a healthy diet by:  Eating less sugar and carbohydrates  Increasing activity/exercise  Talking with your doctor about reaching your blood sugar goals High blood sugars (greater than 180 mg/dL) can raise your risk of infections and slow your recovery, so you will need to focus on controlling your diabetes during the weeks before surgery. Make sure that the doctor who takes care of your diabetes knows about your planned surgery including the date and location.  How do I manage my blood sugar before  surgery? Check your blood sugar at least 4 times a day, starting 2 days before surgery, to make sure that the level is not too high or low.  Check your blood sugar the morning of your surgery when you wake up and every 2 hours until you get to the Short Stay unit.  If your blood sugar is less than 70 mg/dL, you will need to treat for low blood sugar: Do not take insulin. Treat a low blood sugar (less than 70 mg/dL) with  cup of clear juice (cranberry or apple), 4 glucose tablets, OR glucose gel. Recheck blood sugar in 15 minutes after treatment (to make sure it is greater than 70 mg/dL). If your blood sugar is not greater than 70 mg/dL on recheck, call [phone] for further instructions. Report your blood sugar to the short stay nurse when you get to Short Stay.  If you are admitted to the hospital after surgery: Your blood sugar will be checked by the staff and you will probably be given insulin after surgery (instead of oral diabetes medicines) to make sure you have good blood sugar levels. The goal for blood sugar control after surgery is 80-180 mg/dL.    [facility] is not responsible for any belongings or valuables.    Do NOT Smoke (Tobacco/Vaping)  24 hours prior to your procedure  If you use a CPAP at night, you may bring your mask for your overnight stay.   Contacts, glasses, hearing aids, dentures or partials may not be worn into surgery, please bring cases for these  belongings   For patients admitted to the hospital, discharge time will be determined by your treatment team.   Patients discharged the day of surgery will not be allowed to drive home, and someone needs to stay with them for 24 hours.   SURGICAL WAITING ROOM VISITATION Patients having surgery or a procedure may have no more than 2 support people in the waiting area - these visitors may rotate.   Children under the age of 56 must have an adult with them who is not the patient. If the patient needs to stay at  the hospital during part of their recovery, the visitor guidelines for inpatient rooms apply. Pre-op nurse will coordinate an appropriate time for 1 support person to accompany patient in pre-op.  This support person may not rotate.   Please refer to the [facility] website for the visitor guidelines for Inpatients (after your surgery is over and you are in a regular room).    Special instructions:    Oral Hygiene is also important to reduce your risk of infection.  Remember - BRUSH YOUR TEETH THE MORNING OF SURGERY WITH YOUR REGULAR TOOTHPASTE   [place]- Preparing For Surgery  Before surgery, you can play an important role. Because skin is not sterile, your skin needs to be as free of germs as possible. You can reduce the number of germs on your skin by washing with CHG (chlorahexidine gluconate) Soap before surgery.  CHG is an antiseptic cleaner which kills germs and bonds with the skin to continue killing germs even after washing.     Please do not use if you have an allergy to CHG or antibacterial soaps. If your skin becomes reddened/irritated stop using the CHG.  Do not shave (including legs and underarms) for at least 48 hours prior to first CHG shower. It is OK to shave your face.  Please follow these instructions carefully.     Shower the NIGHT BEFORE SURGERY and the MORNING OF SURGERY with CHG Soap.   If you chose to wash your hair, wash your hair first as usual with your normal shampoo. After you shampoo, rinse your hair and body thoroughly to remove the shampoo.  Then [employer] and genitals (private parts) with your normal soap and rinse thoroughly to remove soap.  After that Use CHG Soap as you would any other liquid soap. You can apply CHG directly to the skin and wash gently with a scrungie or a clean washcloth.   Apply the CHG Soap to your body ONLY FROM THE NECK DOWN.  Do not use on open wounds or open sores. Avoid contact with your eyes, ears, mouth and genitals  (private parts). Wash Face and genitals (private parts)  with your normal soap.   Wash thoroughly, paying special attention to the area where your surgery will be performed.  Thoroughly rinse your body with warm water from the neck down.  DO NOT shower/wash with your normal soap after using and rinsing off the CHG Soap.  Pat yourself dry with a CLEAN TOWEL.  Wear CLEAN PAJAMAS to bed the night before surgery  Place CLEAN SHEETS on your bed the night before your surgery  DO NOT SLEEP WITH PETS.   Day of Surgery:  Take a shower with CHG soap. Wear Clean/Comfortable clothing the morning of surgery Do not wear jewelry or makeup. Do not wear lotions, powders, perfumes/cologne or deodorant. Do not shave 48 hours prior to surgery.  Men may shave face and neck. Do not  bring valuables to the hospital. Do not wear nail polish, gel polish, artificial nails, or any other type of covering on natural nails (fingers and toes) If you have artificial nails or gel coating that need to be removed by a nail salon, please have this removed prior to surgery. Artificial nails or gel coating may interfere with anesthesia's ability to adequately monitor your vital signs.  Remember to brush your teeth WITH YOUR REGULAR TOOTHPASTE.    If you received a COVID test during your pre-op visit, it is requested that you wear a mask when out in public, stay away from anyone that may not be feeling well, and notify your surgeon if you develop symptoms. If you have been in contact with anyone that has tested positive in the last 10 days, please notify your surgeon.    Please read over the following fact sheets that you were given.

## 2021-11-09 ENCOUNTER — Other Ambulatory Visit: Payer: Self-pay

## 2021-11-09 ENCOUNTER — Encounter (HOSPITAL_COMMUNITY)
Admission: RE | Admit: 2021-11-09 | Discharge: 2021-11-09 | Disposition: A | Discharge status: Home / Self Care | Payer: Medicare PPO | Source: Ambulatory Visit | Attending: Cardiovascular Disease | Admitting: Cardiovascular Disease

## 2021-11-09 ENCOUNTER — Encounter (HOSPITAL_COMMUNITY): Payer: Self-pay

## 2021-11-09 ENCOUNTER — Other Ambulatory Visit: Payer: Self-pay | Admitting: Family Medicine

## 2021-11-09 ENCOUNTER — Ambulatory Visit (HOSPITAL_COMMUNITY)
Admission: RE | Admit: 2021-11-09 | Discharge: 2021-11-09 | Disposition: A | Discharge status: Home / Self Care | Payer: Medicare PPO | Source: Ambulatory Visit | Attending: Cardiovascular Disease | Admitting: Cardiovascular Disease

## 2021-11-09 VITALS — BP 139/72 | HR 69 | Temp 97.9°F | Resp 18 | Ht 63.0 in | Wt 140.7 lb

## 2021-11-09 DIAGNOSIS — I350 Nonrheumatic aortic (valve) stenosis: Secondary | ICD-10-CM | POA: Diagnosis not present

## 2021-11-09 DIAGNOSIS — Z20822 Contact with and (suspected) exposure to covid-19: Secondary | ICD-10-CM | POA: Insufficient documentation

## 2021-11-09 DIAGNOSIS — Z01818 Encounter for other preprocedural examination: Secondary | ICD-10-CM | POA: Insufficient documentation

## 2021-11-09 DIAGNOSIS — E119 Type 2 diabetes mellitus without complications: Secondary | ICD-10-CM | POA: Diagnosis not present

## 2021-11-09 DIAGNOSIS — E039 Hypothyroidism, unspecified: Secondary | ICD-10-CM

## 2021-11-09 DIAGNOSIS — I700 Atherosclerosis of aorta: Secondary | ICD-10-CM | POA: Diagnosis not present

## 2021-11-09 LAB — CBC
HCT: 42 % (ref 36.0–46.0)
Hemoglobin: 14.2 g/dL (ref 12.0–15.0)
MCH: 30.7 pg (ref 26.0–34.0)
MCHC: 33.8 g/dL (ref 30.0–36.0)
MCV: 90.7 fL (ref 80.0–100.0)
Platelets: 166 10*3/uL (ref 150–400)
RBC: 4.63 MIL/uL (ref 3.87–5.11)
RDW: 13.6 % (ref 11.5–15.5)
WBC: 6.7 10*3/uL (ref 4.0–10.5)
nRBC: 0 % (ref 0.0–0.2)

## 2021-11-09 LAB — TYPE AND SCREEN
ABO/RH(D): A POS
Antibody Screen: NEGATIVE

## 2021-11-09 LAB — URINALYSIS, ROUTINE W REFLEX MICROSCOPIC
Bilirubin Urine: NEGATIVE
Glucose, UA: NEGATIVE mg/dL
Hgb urine dipstick: NEGATIVE
Ketones, ur: NEGATIVE mg/dL
Leukocytes,Ua: NEGATIVE
Nitrite: NEGATIVE
Protein, ur: NEGATIVE mg/dL
Specific Gravity, Urine: >1.03 — ABNORMAL HIGH (ref 1.005–1.030)
pH: 6 (ref 5.0–8.0)

## 2021-11-09 LAB — COMPREHENSIVE METABOLIC PANEL WITH GFR
ALT: 37 U/L (ref 0–44)
AST: 38 U/L (ref 15–41)
Albumin: 3.6 g/dL (ref 3.5–5.0)
Alkaline Phosphatase: 49 U/L (ref 38–126)
Anion gap: 8 (ref 5–15)
BUN: 10 mg/dL (ref 8–23)
CO2: 26 mmol/L (ref 22–32)
Calcium: 9.5 mg/dL (ref 8.9–10.3)
Chloride: 101 mmol/L (ref 98–111)
Creatinine, Ser: 0.91 mg/dL (ref 0.44–1.00)
GFR, Estimated: >60 mL/min
Glucose, Bld: 113 mg/dL — ABNORMAL HIGH (ref 70–99)
Potassium: 3.9 mmol/L (ref 3.5–5.1)
Sodium: 135 mmol/L (ref 135–145)
Total Bilirubin: 0.6 mg/dL (ref 0.3–1.2)
Total Protein: 6.9 g/dL (ref 6.5–8.1)

## 2021-11-09 LAB — GLUCOSE, CAPILLARY: Glucose-Capillary: 191 mg/dL — ABNORMAL HIGH (ref 70–99)

## 2021-11-09 LAB — SARS CORONAVIRUS 2 (TAT 6-24 HRS): SARS Coronavirus 2: NEGATIVE

## 2021-11-09 LAB — SURGICAL PCR SCREEN
MRSA, PCR: NEGATIVE
Staphylococcus aureus: NEGATIVE

## 2021-11-09 LAB — PROTIME-INR
INR: 1.1 (ref 0.8–1.2)
Prothrombin Time: 13.6 s (ref 11.4–15.2)

## 2021-11-09 NOTE — Progress Notes (Signed)
PCP - Libby Maw MD Cardiologist - Cassie Freer O'Neal,MD  PPM/ICD - Denies Device Orders -  Rep Notified -   Chest x-ray - 11/09/21 EKG - 10/24/21 Stress Test - none ECHO - 10/08/20 Cardiac Cath - 10/24/21  Sleep Study - denies CPAP -   Fasting Blood Sugar - 80's Checks Blood Sugar every other day  Blood Thinner Instructions: na Aspirin Instructions:  ERAS Protcol -no PRE-SURGERY Ensure or G2-   COVID TEST- 11/09/21   Anesthesia review: yes-cardiac clearance  Patient denies shortness of breath, fever, cough and chest pain at PAT appointment   All instructions explained to the patient, with a verbal understanding of the material. Patient agrees to go over the instructions while at home for a better understanding. Patient also instructed to wear a mask when out in public prior to surgery after being tested for COVID-19. The opportunity to ask questions was provided.

## 2021-11-09 NOTE — Pre-Procedure Instructions (Addendum)
Surgical Instructions    Your procedure is scheduled on Tuesday September 26.  Report to [facility] Main Entrance "A" at 5:30 A.M., then check in with the Admitting office.  Call this number if you have problems the morning of surgery:  [phone]   If you have any questions prior to your surgery date call [phone]: Open Monday-Friday 8am-4pm    Remember:  Do not eat or drink after midnight the night before your surgery   Medication instructions per your surgeon:  STOP now, 9/20,  taking any Aspirin (unless otherwise instructed by your surgeon), Aleve, Naproxen, Ibuprofen, Motrin, Advil, Goody's, BC's, all herbal medications, fish oil, and all non-prescription vitamins.      Continue taking all other medications without change through the day before surgery. On the morning of surgery take only Levothyroxine with a sip of water.    HOW TO MANAGE YOUR DIABETES BEFORE AND AFTER SURGERY  Why is it important to control my blood sugar before and after surgery? Improving blood sugar levels before and after surgery helps healing and can limit problems. A way of improving blood sugar control is eating a healthy diet by:  Eating less sugar and carbohydrates  Increasing activity/exercise  Talking with your doctor about reaching your blood sugar goals High blood sugars (greater than 180 mg/dL) can raise your risk of infections and slow your recovery, so you will need to focus on controlling your diabetes during the weeks before surgery. Make sure that the doctor who takes care of your diabetes knows about your planned surgery including the date and location.  How do I manage my blood sugar before surgery? Check your blood sugar at least 4 times a day, starting 2 days before surgery, to make sure that the level is not too high or low.  Check your blood sugar the morning of your surgery when you wake up and every 2 hours until you get to the Short Stay unit.  If your blood sugar is  less than 70 mg/dL, you will need to treat for low blood sugar: Do not take insulin. Treat a low blood sugar (less than 70 mg/dL) with  cup of clear juice (cranberry or apple), 4 glucose tablets, OR glucose gel. Recheck blood sugar in 15 minutes after treatment (to make sure it is greater than 70 mg/dL). If your blood sugar is not greater than 70 mg/dL on recheck, call [phone] for further instructions. Report your blood sugar to the short stay nurse when you get to Short Stay.  If you are admitted to the hospital after surgery: Your blood sugar will be checked by the staff and you will probably be given insulin after surgery (instead of oral diabetes medicines) to make sure you have good blood sugar levels. The goal for blood sugar control after surgery is 80-180 mg/dL.    [facility] is not responsible for any belongings or valuables.    Do NOT Smoke (Tobacco/Vaping)  24 hours prior to your procedure  If you use a CPAP at night, you may bring your mask for your overnight stay.   Contacts, glasses, hearing aids, dentures or partials may not be worn into surgery, please bring cases for these belongings   For patients admitted to the hospital, discharge time will be determined by your treatment team.   Patients discharged the day of surgery will not be allowed to drive home, and someone needs to stay with them for 24 hours.   SURGICAL WAITING ROOM VISITATION Patients having  surgery or a procedure may have no more than 2 support people in the waiting area - these visitors may rotate.   Children under the age of 92 must have an adult with them who is not the patient. If the patient needs to stay at the hospital during part of their recovery, the visitor guidelines for inpatient rooms apply. Pre-op nurse will coordinate an appropriate time for 1 support person to accompany patient in pre-op.  This support person may not rotate.   Please refer to the [facility] website for the visitor  guidelines for Inpatients (after your surgery is over and you are in a regular room).    Special instructions:    Oral Hygiene is also important to reduce your risk of infection.  Remember - BRUSH YOUR TEETH THE MORNING OF SURGERY WITH YOUR REGULAR TOOTHPASTE   [facility]- Preparing For Surgery  Before surgery, you can play an important role. Because skin is not sterile, your skin needs to be as free of germs as possible. You can reduce the number of germs on your skin by washing with CHG (chlorahexidine gluconate) Soap before surgery.  CHG is an antiseptic cleaner which kills germs and bonds with the skin to continue killing germs even after washing.     Please do not use if you have an allergy to CHG or antibacterial soaps. If your skin becomes reddened/irritated stop using the CHG.  Do not shave (including legs and underarms) for at least 48 hours prior to first CHG shower. It is OK to shave your face.  Please follow these instructions carefully.     Shower the NIGHT BEFORE SURGERY and the MORNING OF SURGERY with CHG Soap.   If you chose to wash your hair, wash your hair first as usual with your normal shampoo. After you shampoo, rinse your hair and body thoroughly to remove the shampoo.  Then [employer] and genitals (private parts) with your normal soap and rinse thoroughly to remove soap.  After that Use CHG Soap as you would any other liquid soap. You can apply CHG directly to the skin and wash gently with a scrungie or a clean washcloth.   Apply the CHG Soap to your body ONLY FROM THE NECK DOWN.  Do not use on open wounds or open sores. Avoid contact with your eyes, ears, mouth and genitals (private parts). Wash Face and genitals (private parts)  with your normal soap.   Wash thoroughly, paying special attention to the area where your surgery will be performed.  Thoroughly rinse your body with warm water from the neck down.  DO NOT shower/wash with your normal soap after using  and rinsing off the CHG Soap.  Pat yourself dry with a CLEAN TOWEL.  Wear CLEAN PAJAMAS to bed the night before surgery  Place CLEAN SHEETS on your bed the night before your surgery  DO NOT SLEEP WITH PETS.   Day of Surgery:  Take a shower with CHG soap. Wear Clean/Comfortable clothing the morning of surgery Do not wear jewelry or makeup. Do not wear lotions, powders, perfumes/cologne or deodorant. Do not shave 48 hours prior to surgery.  Men may shave face and neck. Do not bring valuables to the hospital. Do not wear nail polish, gel polish, artificial nails, or any other type of covering on natural nails (fingers and toes) If you have artificial nails or gel coating that need to be removed by a nail salon, please have this removed prior to surgery. Artificial  nails or gel coating may interfere with anesthesia's ability to adequately monitor your vital signs.  Remember to brush your teeth WITH YOUR REGULAR TOOTHPASTE.    If you received a COVID test during your pre-op visit, it is requested that you wear a mask when out in public, stay away from anyone that may not be feeling well, and notify your surgeon if you develop symptoms. If you have been in contact with anyone that has tested positive in the last 10 days, please notify your surgeon.    Please read over the following fact sheets that you were given.

## 2021-11-12 ENCOUNTER — Other Ambulatory Visit: Payer: Self-pay | Admitting: Family Medicine

## 2021-11-12 ENCOUNTER — Other Ambulatory Visit: Payer: Self-pay

## 2021-11-12 DIAGNOSIS — E039 Hypothyroidism, unspecified: Secondary | ICD-10-CM

## 2021-11-12 MED ORDER — LEVOTHYROXINE SODIUM 75 MCG PO TABS: 75.0000 ug | ORAL_TABLET | Freq: Every day | ORAL | 2 refills | Status: DC

## 2021-11-12 MED ORDER — POTASSIUM CHLORIDE 2 MEQ/ML IV SOLN
80.0000 meq | INTRAVENOUS | Status: DC
  Filled 2021-11-12: qty 40

## 2021-11-12 MED ORDER — MAGNESIUM SULFATE 50 % IJ SOLN
40.0000 meq | INTRAMUSCULAR | Status: DC
  Filled 2021-11-12: qty 9.85

## 2021-11-12 MED ORDER — NOREPINEPHRINE 4 MG/250ML-% IV SOLN
0.0000 ug/min | INTRAVENOUS | Status: DC
  Filled 2021-11-12: qty 250

## 2021-11-12 MED ORDER — HEPARIN 30,000 UNITS/1000 ML (OHS) CELLSAVER SOLUTION
Status: DC
  Filled 2021-11-12: qty 1000

## 2021-11-12 MED ORDER — CEFAZOLIN SODIUM-DEXTROSE 2-4 GM/100ML-% IV SOLN
2.0000 g | INTRAVENOUS | Status: AC
  Administered 2021-11-13: 2 g via INTRAVENOUS
  Filled 2021-11-12: qty 100

## 2021-11-12 MED ORDER — DEXMEDETOMIDINE HCL IN NACL 400 MCG/100ML IV SOLN
0.1000 ug/kg/h | INTRAVENOUS | Status: AC
  Administered 2021-11-13: 1 ug/kg/h via INTRAVENOUS
  Administered 2021-11-13: 31.92 ug via INTRAVENOUS
  Filled 2021-11-12: qty 100

## 2021-11-12 NOTE — H&P (Signed)
PCP is Libby Maw, MD Referring Provider is O'Neal, Cassie Freer, *   No chief complaint on file.     HPI: 81 yo wf who was told she had a heart murmur in the past 2 years who has been suffering from progressive DOE and more so Fatigue  over the past few months. She denies CP or palpitations. She has ongoing back pain and was felt to require back surgery but in work up of her murmur she was found to have aortic stenosis that has progressed quite significantly from her previous echos. She has now a mean gradient of 61mHg and with an AVA of 0.46cm2, and velocity pk of 4.870mec. She has normal LV function. She underwent cardiac cath and was found not to have CAD and was felt to be a TAVR candidate after visit with Dr CoBurt Knacknd had TAVR CTA which has shown acceptable anatomy. Pt now presents for surgical evaluation       Past Medical History:  Diagnosis Date   Anxiety     Arthritis     [place]    Carpal tunnel syndrome     Deafness in left ear     Depression     Diabetes mellitus      diet controlled   Diverticulitis     GERD (gastroesophageal reflux disease)     Heart murmur     Hypercholesteremia     Hypertension     Hypothyroid     Memory loss      reports d/t concussion   PONV (postoperative nausea and vomiting) yrs ago, none recent   Post concussion syndrome     [facility]     reports [facility] were silent           Past Surgical History:  Procedure Laterality Date   [place] 2010    lower   BIOPSY   01/12/2018    Procedure: BIOPSY;  Surgeon: MaIrving Copas MD;  [facility];  Service: Gastroenterology;;   CHOLECYSTECTOMY       ESOPHAGOGASTRODUODENOSCOPY (EGD) WITH PROPOFOL N/A 01/12/2018    Procedure: ESOPHAGOGASTRODUODENOSCOPY (EGD) WITH PROPOFOL;  Surgeon: MaIrving Copas MD;  [facility];  Service: Gastroenterology;  Laterality: N/A;   left elobow surgery   2003   left  rotator cuff   2001   LUMBAR LAMINECTOMY/DECOMPRESSION MICRODISCECTOMY Left 12/23/2017    Procedure: Laminectomy for facet/synovial cyst - left - Lumbar three-Lumbar four;  Surgeon: PoEarnie LarssonMD;  [place] [facility] Service: Neurosurgery;  Laterality: Left;   r foot surgery   1995   REVERSE SHOULDER ARTHROPLASTY Left 10/23/2018    Procedure: REVERSE TOTAL SHOULDER ARTHROPLASTY;  Surgeon: NoNetta CedarsMD;  Location: [facility];  Service: Orthopedics;  Laterality: Left;  interscalene block   REVERSE SHOULDER ARTHROPLASTY Right 04/20/2021    Procedure: REVERSE SHOULDER ARTHROPLASTY;  Surgeon: NoNetta CedarsMD;  Location: [facility];  Service: Orthopedics;  Laterality: Right;  with ISB   right hand surgery   2001   RIGHT HEART CATH AND CORONARY ANGIOGRAPHY N/A 10/24/2021    Procedure: RIGHT HEART CATH AND CORONARY ANGIOGRAPHY;  Surgeon: CoSherren MochaMD;  [facility];  Service: Cardiovascular;  Laterality: N/A;   right index finger surgery   2009   SHOULDER OPEN ROTATOR CUFF REPAIR   11/21/2011    Procedure: ROTATOR CUFF REPAIR SHOULDER OPEN;  Surgeon: JeJohnn HaiMD;  Location: [facility];  Service: Orthopedics;  Laterality: Right;  with subacromial decompression   SHOULDER OPEN ROTATOR CUFF REPAIR Right 05/09/2016    Procedure: Right shoulder mini open revision rotator cuff repair, subacromial decompression;  Surgeon: Susa Day, MD;  Location: [facility];  Service: Orthopedics;  Laterality: Right;   TOTAL HIP ARTHROPLASTY Right 04/24/2017    Procedure: RIGHT TOTAL HIP ARTHROPLASTY ANTERIOR APPROACH;  Surgeon: Rod Can, MD;  Location[facility];  Service: Orthopedics;  Laterality: Right;  Needs RNFA           Family History  Problem Relation Age of Onset   Heart attack Mother     Stroke Father     Heart attack Father     Stroke Sister     Stroke Brother     Diabetes Brother     Dementia Brother     Heart disease Brother     Stroke Sister          TIA      Social  History Social History         Tobacco Use   Smoking status: Never   Smokeless tobacco: Never  Vaping Use   Vaping Use: Never used  Substance Use Topics   Alcohol use: Yes      Alcohol/week: 1.0 standard drink of alcohol      Types: 1 Glasses of wine per week      Comment: occas   Drug use: No            Current Outpatient Medications  Medication Sig Dispense Refill   amLODipine (NORVASC) 5 MG tablet TAKE 1 AND 1/2 TABLET BY MOUTH DAILY 135 tablet 1   Calcium Carbonate-Vitamin D (CALCIUM-D PO) Take 2 tablets by mouth daily. CHEWABLES       Cholecalciferol 50 MCG (2000 UT) TABS Take 2,000 Units by mouth in the morning.       donepezil (ARICEPT) 5 MG tablet Take 1 tablet (5 mg total) by mouth at bedtime. (Patient not taking: Reported on 10/16/2021) 90 tablet 0   escitalopram (LEXAPRO) 20 MG tablet Take 1 tablet (20 mg total) by mouth daily. 90 tablet 0   esomeprazole (NEXIUM) 40 MG capsule Take 1 capsule (40 mg total) by mouth 2 (two) times daily before a meal. 60 capsule 0   ezetimibe (ZETIA) 10 MG tablet TAKE ONE TABLET BY MOUTH DAILY 90 tablet 2   Ferrous Sulfate Dried (FERROUS SULFATE IRON PO) Take 1 tablet by mouth in the morning.       GINKGO BILOBA PO Take 1 capsule by mouth in the morning.       glucose blood test strip daily.       Lactobacillus (DIGESTIVE HEALTH PROBIOTIC PO) Take 2 capsules by mouth in the morning.       levothyroxine (SYNTHROID) 75 MCG tablet TAKE ONE TABLET BY MOUTH DAILY WITH BREAKFAST 90 tablet 2   Multiple Vitamins-Minerals (WOMENS MULTI GUMMIES PO) Take 2 tablets by mouth daily.       oxybutynin (DITROPAN XL) 15 MG 24 hr tablet Take 1 tablet (15 mg total) by mouth at bedtime. (Patient not taking: Reported on 10/17/2021) 30 tablet 2   rosuvastatin (CRESTOR) 10 MG tablet TAKE ONE TABLET BY MOUTH DAILY 90 tablet 3   traMADol (ULTRAM) 50 MG tablet Take 1 tablet (50 mg total) by mouth every 6 (six) hours as needed. 30 tablet 0   traZODone (DESYREL) 50 MG  tablet Take 0.5 tablets (25 mg total)  by mouth at bedtime. 45 tablet 1    No current facility-administered medications for this visit.           Allergies  Allergen Reactions   Other Other (See Comments) and Rash      Tomato sauce, garlic, onion - severe acid reflux    Flexeril [Cyclobenzaprine] Other (See Comments)      Per spouse "she felt like a zombie"   Chlordiazepoxide-Clidinium Other (See Comments)      Dizziness (intolerance)   Codeine Nausea Only and Rash   Lipitor [Atorvastatin] Other (See Comments)      Unbalanced        There were no vitals taken for this visit. Physical Exam: HEENT: teeth in good repair Cardiac: RR with 3/6 high pitched systolic murmur Lungs: overall clear Ext: no edema   Diagnostic Tests: I have personally visualized and reviewed the following studies and agree with their findings   TTE (09/2021) IMPRESSIONS    1. Very severe aortic stenosis is present. Vmax 4.8 m/s, MG 69 mmHG, AVA  0.46 cm2, DI 0.18. The aortic valve is tricuspid. There is severe  calcifcation of the aortic valve. There is severe thickening of the aortic  valve. Aortic valve regurgitation is  not visualized. Severe aortic valve stenosis.   2. Hyperdynamic LV. Intracavitary gradient 39 mmHG. Left ventricular  ejection fraction, by estimation, is 70 to 75%. The left ventricle has  hyperdynamic function. The left ventricle has no regional wall motion  abnormalities. There is moderate concentric   left ventricular hypertrophy. Left ventricular diastolic parameters are  consistent with Grade I diastolic dysfunction (impaired relaxation).   3. Right ventricular systolic function is normal. The right ventricular  size is normal. Tricuspid regurgitation signal is inadequate for assessing  PA pressure.   4. The mitral valve is grossly normal. No evidence of mitral valve  regurgitation. No evidence of mitral stenosis.   5. The inferior vena cava is normal in size with greater than  50%  respiratory variability, suggesting right atrial pressure of 3 mmHg.    Cardiac Cath (10/2021) Conclusion       1st Diag lesion is 60% stenosed.   1.  Patent coronary arteries (right dominant) with a moderate ostial diagonal stenosis and no other significant coronary artery disease 2.  Normal right heart pressures and cardiac output 3.  Severe calcification and restriction of the aortic valve leaflets seen on plain fluoroscopy with known severe aortic stenosis.  Severe calcification also identified in the aortic root   CTA ( 10/2021) FINDINGS: Image quality: Excellent.   Noise artifact is: Limited.   Valve Morphology: Bicuspid aortic valve with fusion of the RCC/LCC with raphe (Sievers type 1). Severe calcifications of the leaflets with severely restricted movement in systole.   Aortic Valve Calcium score: 1808   Aortic annular dimension:   Phase assessed: 15%   Annular area: 401 mm2   Annular perimeter: 73.4 mm   Max diameter: 26.6 mm   Min diameter: 19.7 mm   Annular and subannular calcification: Minimal calcification under the RCC.   Membranous septum length: 5.0 mm   Optimal coplanar projection: RAO 5 CAU 7   Coronary Artery Height above Annulus:   Left Main: 11.6 mm   Right Coronary: 16.0 mm   Sinus of Valsalva Measurements:   Non-coronary: 28.5 mm   Right-coronary: 27.0 mm   Left-coronary: 28.0 mm   Sinus of Valsalva Height:   Non-coronary: 20.4 mm   Right-coronary: 19.6 mm  Left-coronary: 17.8 mm   Sinotubular Junction: 26 mm   Ascending Thoracic Aorta: 30 mm   Coronary Arteries: Normal coronary origin. Right dominance. The study was performed without use of NTG and is insufficient for plaque evaluation. Please refer to recent cardiac catheterization for coronary assessment. Coronary calcifications noted in the LAD/LCX.   Cardiac Morphology:   Right Atrium: Right atrial size is within normal limits.   Right Ventricle: The  right ventricular cavity is within normal limits.   Left Atrium: Left atrial size is normal in size with no left atrial appendage filling defect.   Left Ventricle: The ventricular cavity size is within normal limits.   Pulmonary arteries: Normal in size without proximal filling defect.   Pulmonary veins: Normal pulmonary venous drainage.   Pericardium: Normal thickness with no significant effusion or calcium present.   Mitral Valve: The mitral valve is degenerative with moderate annular calcium.   Extra-cardiac findings: See attached radiology report for non-cardiac structures.   IMPRESSION: 1. Bicuspid aortic valve with fusion of the RCC/LCC with raphe (Sievers type 1).   2. Annular measurements support a 23 mm S3 (401 mm2). Would recommend against 29 mm Evlot as sinuses do not support this device.   3. No significant annular or subannular calcifications.   4. Sufficient coronary to annulus distance.   5. Optimal Fluoroscopic Angle for Delivery: RAO 5 CAU Bonneau. O'Neal, MD Impression: Severe Aortic Stenosis  Pt with  NYHA class II symptoms with documented severe AS and normal LV function with no CAD or EKG changes. She has a class I indication for AVR and secondary to her age and comorbidities I feel she is an excellent TAVR candidate with surgical bail out if necessary. Pt understands the risks of arterial injury, stroke, need for PPM and aortic injury which may require surgical repair and she understands and wishes to proceed. We discussed obtaining her covid and flu vaccines now before TAVR and that usually 6 weeks before she could consider having her back surgery at the earliest I have spent 65 min in reviewing records, visualizing studies, face to face with pt and in coordinating future care.    Plan: TAVR with full surgical back up.     Coralie Common, MD [facility] [phone]

## 2021-11-12 NOTE — Anesthesia Preprocedure Evaluation (Signed)
Anesthesia Evaluation  Patient identified by MRN, date of birth, ID band Patient awake    Reviewed: Allergy & Precautions, NPO status , Patient's Chart, lab work & pertinent test results  History of Anesthesia Complications Negative for: history of anesthetic complications  Airway Mallampati: IV  TM Distance: >3 FB Neck ROM: Full    Dental  (+) Dental Advisory Given   Pulmonary neg pulmonary ROS,    breath sounds clear to auscultation       Cardiovascular hypertension, Pt. on medications (-) angina+ CAD (10/2021 cath: moderate D1)  + Valvular Problems/Murmurs (severe) AS  Rhythm:Regular Rate:Normal + Systolic murmurs 09/1101 ECHO: EF 70-75%, hyperdynamic LVF with mod LVH, Grade 1 DD, normal RVF, bicuspid AV with very severe AS, with mean grad 69 mmHg, peak grad 94 mmHg, AVA (VTI) 0.46 cm2   Neuro/Psych Anxiety Depression CVA, No Residual Symptoms    GI/Hepatic Neg liver ROS, GERD  Medicated and Controlled,  Endo/Other  diabetes (diet controlled, glu 131)Hypothyroidism   Renal/GU negative Renal ROS     Musculoskeletal   Abdominal   Peds  Hematology negative hematology ROS (+)   Anesthesia Other Findings   Reproductive/Obstetrics                            Anesthesia Physical Anesthesia Plan  ASA: 4  Anesthesia Plan: MAC   Post-op Pain Management: Tylenol PO (pre-op)*   Induction:   PONV Risk Score and Plan: 3 and Ondansetron and Dexamethasone  Airway Management Planned:   Additional Equipment: Arterial line  Intra-op Plan:   Post-operative Plan:   Informed Consent: I have reviewed the patients History and Physical, chart, labs and discussed the procedure including the risks, benefits and alternatives for the proposed anesthesia with the patient or authorized representative who has indicated his/her understanding and acceptance.     Dental advisory given  Plan Discussed with:  CRNA and Surgeon  Anesthesia Plan Comments:        Anesthesia Quick Evaluation

## 2021-11-13 ENCOUNTER — Encounter (HOSPITAL_COMMUNITY)
Admission: RE | Disposition: A | Discharge status: Home / Self Care | Payer: Self-pay | Source: Home / Self Care | Attending: Cardiovascular Disease

## 2021-11-13 ENCOUNTER — Other Ambulatory Visit: Payer: Self-pay | Admitting: Physician Assistant

## 2021-11-13 ENCOUNTER — Inpatient Hospital Stay (HOSPITAL_COMMUNITY): Payer: Medicare PPO

## 2021-11-13 ENCOUNTER — Inpatient Hospital Stay (HOSPITAL_COMMUNITY)
Admission: RE | Admit: 2021-11-13 | Discharge: 2021-11-14 | DRG: 267 | Disposition: A | Discharge status: Home / Self Care | Payer: Medicare PPO | Attending: Cardiovascular Disease | Admitting: Cardiovascular Disease

## 2021-11-13 ENCOUNTER — Inpatient Hospital Stay (HOSPITAL_COMMUNITY): Payer: Medicare PPO | Admitting: Physician Assistant

## 2021-11-13 ENCOUNTER — Other Ambulatory Visit (HOSPITAL_COMMUNITY): Payer: Medicare PPO

## 2021-11-13 ENCOUNTER — Encounter (HOSPITAL_COMMUNITY): Payer: Self-pay | Admitting: Cardiovascular Disease

## 2021-11-13 ENCOUNTER — Inpatient Hospital Stay (HOSPITAL_COMMUNITY): Payer: Medicare PPO | Admitting: Anesthesiology

## 2021-11-13 ENCOUNTER — Other Ambulatory Visit: Payer: Self-pay

## 2021-11-13 DIAGNOSIS — E039 Hypothyroidism, unspecified: Secondary | ICD-10-CM

## 2021-11-13 DIAGNOSIS — G8929 Other chronic pain: Secondary | ICD-10-CM | POA: Diagnosis present

## 2021-11-13 DIAGNOSIS — H9192 Unspecified hearing loss, left ear: Secondary | ICD-10-CM | POA: Diagnosis present

## 2021-11-13 DIAGNOSIS — Z9049 Acquired absence of other specified parts of digestive tract: Secondary | ICD-10-CM

## 2021-11-13 DIAGNOSIS — Z79890 Hormone replacement therapy (postmenopausal): Secondary | ICD-10-CM

## 2021-11-13 DIAGNOSIS — M1990 Unspecified osteoarthritis, unspecified site: Secondary | ICD-10-CM | POA: Diagnosis present

## 2021-11-13 DIAGNOSIS — E785 Hyperlipidemia, unspecified: Secondary | ICD-10-CM | POA: Diagnosis present

## 2021-11-13 DIAGNOSIS — Z888 Allergy status to other drugs, medicaments and biological substances status: Secondary | ICD-10-CM

## 2021-11-13 DIAGNOSIS — Z952 Presence of prosthetic heart valve: Principal | ICD-10-CM

## 2021-11-13 DIAGNOSIS — Z823 Family history of stroke: Secondary | ICD-10-CM | POA: Diagnosis not present

## 2021-11-13 DIAGNOSIS — Z96611 Presence of right artificial shoulder joint: Secondary | ICD-10-CM | POA: Diagnosis present

## 2021-11-13 DIAGNOSIS — D649 Anemia, unspecified: Secondary | ICD-10-CM | POA: Diagnosis not present

## 2021-11-13 DIAGNOSIS — I10 Essential (primary) hypertension: Secondary | ICD-10-CM

## 2021-11-13 DIAGNOSIS — F419 Anxiety disorder, unspecified: Secondary | ICD-10-CM | POA: Diagnosis present

## 2021-11-13 DIAGNOSIS — K219 Gastro-esophageal reflux disease without esophagitis: Secondary | ICD-10-CM | POA: Diagnosis present

## 2021-11-13 DIAGNOSIS — Z8711 Personal history of peptic ulcer disease: Secondary | ICD-10-CM

## 2021-11-13 DIAGNOSIS — Z91018 Allergy to other foods: Secondary | ICD-10-CM

## 2021-11-13 DIAGNOSIS — F32A Depression, unspecified: Secondary | ICD-10-CM | POA: Diagnosis present

## 2021-11-13 DIAGNOSIS — E7800 Pure hypercholesterolemia, unspecified: Secondary | ICD-10-CM | POA: Diagnosis present

## 2021-11-13 DIAGNOSIS — E119 Type 2 diabetes mellitus without complications: Secondary | ICD-10-CM

## 2021-11-13 DIAGNOSIS — I350 Nonrheumatic aortic (valve) stenosis: Secondary | ICD-10-CM

## 2021-11-13 DIAGNOSIS — Z79899 Other long term (current) drug therapy: Secondary | ICD-10-CM

## 2021-11-13 DIAGNOSIS — I440 Atrioventricular block, first degree: Secondary | ICD-10-CM | POA: Diagnosis not present

## 2021-11-13 DIAGNOSIS — Z96612 Presence of left artificial shoulder joint: Secondary | ICD-10-CM | POA: Diagnosis present

## 2021-11-13 DIAGNOSIS — Z96641 Presence of right artificial hip joint: Secondary | ICD-10-CM | POA: Diagnosis present

## 2021-11-13 DIAGNOSIS — Z006 Encounter for examination for normal comparison and control in clinical research program: Secondary | ICD-10-CM

## 2021-11-13 DIAGNOSIS — Z87820 Personal history of traumatic brain injury: Secondary | ICD-10-CM

## 2021-11-13 DIAGNOSIS — Z8673 Personal history of transient ischemic attack (TIA), and cerebral infarction without residual deficits: Secondary | ICD-10-CM

## 2021-11-13 DIAGNOSIS — Z90710 Acquired absence of both cervix and uterus: Secondary | ICD-10-CM

## 2021-11-13 DIAGNOSIS — K279 Peptic ulcer, site unspecified, unspecified as acute or chronic, without hemorrhage or perforation: Secondary | ICD-10-CM | POA: Diagnosis present

## 2021-11-13 DIAGNOSIS — Z79891 Long term (current) use of opiate analgesic: Secondary | ICD-10-CM

## 2021-11-13 DIAGNOSIS — M549 Dorsalgia, unspecified: Secondary | ICD-10-CM | POA: Diagnosis present

## 2021-11-13 DIAGNOSIS — I2510 Atherosclerotic heart disease of native coronary artery without angina pectoris: Secondary | ICD-10-CM | POA: Diagnosis not present

## 2021-11-13 DIAGNOSIS — F418 Other specified anxiety disorders: Secondary | ICD-10-CM | POA: Diagnosis not present

## 2021-11-13 HISTORY — DX: Presence of prosthetic heart valve: Z95.2

## 2021-11-13 HISTORY — PX: INTRAOPERATIVE TRANSTHORACIC ECHOCARDIOGRAM: SHX6523

## 2021-11-13 HISTORY — DX: Nonrheumatic aortic (valve) stenosis: I35.0

## 2021-11-13 HISTORY — PX: TRANSCATHETER AORTIC VALVE REPLACEMENT, TRANSFEMORAL: SHX6400

## 2021-11-13 LAB — POCT I-STAT 7, (LYTES, BLD GAS, ICA,H+H)
Acid-Base Excess: 1 mmol/L (ref 0.0–2.0)
Acid-Base Excess: 0 mmol/L (ref 0.0–2.0)
Bicarbonate: 28.6 mmol/L — ABNORMAL HIGH (ref 20.0–28.0)
Bicarbonate: 27.2 mmol/L (ref 20.0–28.0)
Calcium, Ion: 1.33 mmol/L (ref 1.15–1.40)
Calcium, Ion: 1.27 mmol/L (ref 1.15–1.40)
HCT: 39 % (ref 36.0–46.0)
HCT: 33 % — ABNORMAL LOW (ref 36.0–46.0)
Hemoglobin: 13.3 g/dL (ref 12.0–15.0)
Hemoglobin: 11.2 g/dL — ABNORMAL LOW (ref 12.0–15.0)
O2 Saturation: 99 %
O2 Saturation: 92 %
Potassium: 4.1 mmol/L (ref 3.5–5.1)
Potassium: 4.2 mmol/L (ref 3.5–5.1)
Sodium: 140 mmol/L (ref 135–145)
Sodium: 138 mmol/L (ref 135–145)
TCO2: 30 mmol/L (ref 22–32)
TCO2: 29 mmol/L (ref 22–32)
pCO2 arterial: 56.9 mmHg — ABNORMAL HIGH (ref 32–48)
pCO2 arterial: 53.6 mmHg — ABNORMAL HIGH (ref 32–48)
pH, Arterial: 7.309 — ABNORMAL LOW (ref 7.35–7.45)
pH, Arterial: 7.313 — ABNORMAL LOW (ref 7.35–7.45)
pO2, Arterial: 131 mmHg — ABNORMAL HIGH (ref 83–108)
pO2, Arterial: 69 mmHg — ABNORMAL LOW (ref 83–108)

## 2021-11-13 LAB — POCT I-STAT, CHEM 8
BUN: 10 mg/dL (ref 8–23)
BUN: 9 mg/dL (ref 8–23)
BUN: 9 mg/dL (ref 8–23)
Calcium, Ion: 1.26 mmol/L (ref 1.15–1.40)
Calcium, Ion: 1.31 mmol/L (ref 1.15–1.40)
Calcium, Ion: 1.28 mmol/L (ref 1.15–1.40)
Chloride: 102 mmol/L (ref 98–111)
Chloride: 103 mmol/L (ref 98–111)
Chloride: 102 mmol/L (ref 98–111)
Creatinine, Ser: 0.8 mg/dL (ref 0.44–1.00)
Creatinine, Ser: 0.7 mg/dL (ref 0.44–1.00)
Creatinine, Ser: 0.7 mg/dL (ref 0.44–1.00)
Glucose, Bld: 120 mg/dL — ABNORMAL HIGH (ref 70–99)
Glucose, Bld: 166 mg/dL — ABNORMAL HIGH (ref 70–99)
Glucose, Bld: 153 mg/dL — ABNORMAL HIGH (ref 70–99)
HCT: 39 % (ref 36.0–46.0)
HCT: 33 % — ABNORMAL LOW (ref 36.0–46.0)
HCT: 36 % (ref 36.0–46.0)
Hemoglobin: 13.3 g/dL (ref 12.0–15.0)
Hemoglobin: 11.2 g/dL — ABNORMAL LOW (ref 12.0–15.0)
Hemoglobin: 12.2 g/dL (ref 12.0–15.0)
Potassium: 4.1 mmol/L (ref 3.5–5.1)
Potassium: 4.1 mmol/L (ref 3.5–5.1)
Potassium: 4.1 mmol/L (ref 3.5–5.1)
Sodium: 139 mmol/L (ref 135–145)
Sodium: 139 mmol/L (ref 135–145)
Sodium: 139 mmol/L (ref 135–145)
TCO2: 28 mmol/L (ref 22–32)
TCO2: 27 mmol/L (ref 22–32)
TCO2: 27 mmol/L (ref 22–32)

## 2021-11-13 LAB — ECHOCARDIOGRAM LIMITED
AR max vel: 2.32 cm2
AV Area VTI: 2.77 cm2
AV Area mean vel: 2.2 cm2
AV Mean grad: 4 mmHg
AV Peak grad: 8.2 mmHg
Ao pk vel: 1.43 m/s

## 2021-11-13 LAB — GLUCOSE, CAPILLARY: Glucose-Capillary: 131 mg/dL — ABNORMAL HIGH (ref 70–99)

## 2021-11-13 SURGERY — IMPLANTATION, AORTIC VALVE, TRANSCATHETER, FEMORAL APPROACH
Anesthesia: Monitor Anesthesia Care | Site: Chest

## 2021-11-13 MED ORDER — LACTATED RINGERS IV SOLN: INTRAVENOUS | Status: DC | PRN

## 2021-11-13 MED ORDER — SODIUM CHLORIDE 0.9 % IV SOLN: INTRAVENOUS | Status: DC

## 2021-11-13 MED ORDER — ACETAMINOPHEN 650 MG RE SUPP: 650.0000 mg | Freq: Four times a day (QID) | RECTAL | Status: DC | PRN

## 2021-11-13 MED ORDER — SODIUM CHLORIDE 0.9% FLUSH: 3.0000 mL | INTRAVENOUS | Status: DC | PRN

## 2021-11-13 MED ORDER — CHLORHEXIDINE GLUCONATE 4 % EX LIQD: 60.0000 mL | Freq: Once | CUTANEOUS | Status: DC

## 2021-11-13 MED ORDER — HEPARIN 6000 UNIT IRRIGATION SOLUTION
Status: DC | PRN
  Administered 2021-11-13 (×3): 1

## 2021-11-13 MED ORDER — ROSUVASTATIN CALCIUM 5 MG PO TABS
10.0000 mg | ORAL_TABLET | Freq: Every day | ORAL | Status: DC
  Administered 2021-11-14: 10 mg via ORAL
  Filled 2021-11-13: qty 2

## 2021-11-13 MED ORDER — DEXAMETHASONE SODIUM PHOSPHATE 10 MG/ML IJ SOLN
INTRAMUSCULAR | Status: DC | PRN
  Administered 2021-11-13: 10 mg via INTRAVENOUS

## 2021-11-13 MED ORDER — LIDOCAINE HCL 1 % IJ SOLN
INTRAMUSCULAR | Status: AC
  Filled 2021-11-13: qty 20

## 2021-11-13 MED ORDER — MIDAZOLAM HCL 2 MG/2ML IJ SOLN
INTRAMUSCULAR | Status: AC
  Filled 2021-11-13: qty 2

## 2021-11-13 MED ORDER — ONDANSETRON HCL 4 MG/2ML IJ SOLN
INTRAMUSCULAR | Status: AC
  Filled 2021-11-13: qty 2

## 2021-11-13 MED ORDER — FENTANYL CITRATE (PF) 250 MCG/5ML IJ SOLN
INTRAMUSCULAR | Status: DC | PRN
  Administered 2021-11-13: 100 ug via INTRAVENOUS
  Administered 2021-11-13: 50 ug via INTRAVENOUS

## 2021-11-13 MED ORDER — LIDOCAINE 2% (20 MG/ML) 5 ML SYRINGE
INTRAMUSCULAR | Status: AC
  Filled 2021-11-13: qty 5

## 2021-11-13 MED ORDER — SUCCINYLCHOLINE CHLORIDE 200 MG/10ML IV SOSY
PREFILLED_SYRINGE | INTRAVENOUS | Status: AC
  Filled 2021-11-13: qty 10

## 2021-11-13 MED ORDER — CEFAZOLIN SODIUM-DEXTROSE 2-4 GM/100ML-% IV SOLN
2.0000 g | Freq: Three times a day (TID) | INTRAVENOUS | Status: AC
  Administered 2021-11-13 (×2): 2 g via INTRAVENOUS
  Filled 2021-11-13 (×2): qty 100

## 2021-11-13 MED ORDER — TRAZODONE HCL 50 MG PO TABS
25.0000 mg | ORAL_TABLET | Freq: Every day | ORAL | Status: DC
  Administered 2021-11-13: 25 mg via ORAL
  Filled 2021-11-13: qty 1

## 2021-11-13 MED ORDER — PANTOPRAZOLE SODIUM 40 MG PO TBEC
40.0000 mg | DELAYED_RELEASE_TABLET | Freq: Every day | ORAL | Status: DC
  Administered 2021-11-14: 40 mg via ORAL
  Filled 2021-11-13: qty 1

## 2021-11-13 MED ORDER — HEPARIN SODIUM (PORCINE) 1000 UNIT/ML IJ SOLN
INTRAMUSCULAR | Status: DC | PRN
  Administered 2021-11-13: 10000 [IU] via INTRAVENOUS

## 2021-11-13 MED ORDER — SODIUM CHLORIDE 0.9 % IV SOLN: 250.0000 mL | INTRAVENOUS | Status: DC | PRN

## 2021-11-13 MED ORDER — NITROGLYCERIN IN D5W 200-5 MCG/ML-% IV SOLN
0.0000 ug/min | INTRAVENOUS | Status: DC
  Filled 2021-11-13: qty 250

## 2021-11-13 MED ORDER — ACETAMINOPHEN 500 MG PO TABS
1000.0000 mg | ORAL_TABLET | Freq: Once | ORAL | Status: AC
  Administered 2021-11-13: 1000 mg via ORAL
  Filled 2021-11-13: qty 2

## 2021-11-13 MED ORDER — TRAMADOL HCL 50 MG PO TABS
50.0000 mg | ORAL_TABLET | Freq: Four times a day (QID) | ORAL | Status: DC | PRN
  Administered 2021-11-13 – 2021-11-14 (×3): 50 mg via ORAL
  Filled 2021-11-13 (×3): qty 1

## 2021-11-13 MED ORDER — HEPARIN 6000 UNIT IRRIGATION SOLUTION
Status: AC
  Filled 2021-11-13: qty 1500

## 2021-11-13 MED ORDER — FENTANYL CITRATE (PF) 250 MCG/5ML IJ SOLN
INTRAMUSCULAR | Status: AC
  Filled 2021-11-13: qty 5

## 2021-11-13 MED ORDER — PROTAMINE SULFATE 10 MG/ML IV SOLN
INTRAVENOUS | Status: DC | PRN
  Administered 2021-11-13: 100 mg via INTRAVENOUS

## 2021-11-13 MED ORDER — DONEPEZIL HCL 5 MG PO TABS
5.0000 mg | ORAL_TABLET | Freq: Every day | ORAL | Status: DC
  Administered 2021-11-13: 5 mg via ORAL
  Filled 2021-11-13: qty 1

## 2021-11-13 MED ORDER — ONDANSETRON HCL 4 MG/2ML IJ SOLN: 4.0000 mg | Freq: Four times a day (QID) | INTRAMUSCULAR | Status: DC | PRN

## 2021-11-13 MED ORDER — LEVOTHYROXINE SODIUM 75 MCG PO TABS
75.0000 ug | ORAL_TABLET | Freq: Every day | ORAL | Status: DC
  Administered 2021-11-14: 75 ug via ORAL
  Filled 2021-11-13: qty 1

## 2021-11-13 MED ORDER — AMLODIPINE BESYLATE 5 MG PO TABS
7.5000 mg | ORAL_TABLET | Freq: Every day | ORAL | Status: DC
  Administered 2021-11-14: 7.5 mg via ORAL
  Filled 2021-11-13: qty 2

## 2021-11-13 MED ORDER — FERROUS SULFATE 325 (65 FE) MG PO TABS
325.0000 mg | ORAL_TABLET | Freq: Every day | ORAL | Status: DC
  Administered 2021-11-14: 325 mg via ORAL
  Filled 2021-11-13: qty 1

## 2021-11-13 MED ORDER — OXYCODONE HCL 5 MG PO TABS
5.0000 mg | ORAL_TABLET | ORAL | Status: DC | PRN
  Administered 2021-11-14: 10 mg via ORAL
  Filled 2021-11-13: qty 2

## 2021-11-13 MED ORDER — CHLORHEXIDINE GLUCONATE 0.12 % MT SOLN
15.0000 mL | Freq: Once | OROMUCOSAL | Status: AC
  Administered 2021-11-13: 15 mL via OROMUCOSAL
  Filled 2021-11-13: qty 15

## 2021-11-13 MED ORDER — CHLORHEXIDINE GLUCONATE 4 % EX LIQD: 30.0000 mL | CUTANEOUS | Status: DC

## 2021-11-13 MED ORDER — ESCITALOPRAM OXALATE 20 MG PO TABS
20.0000 mg | ORAL_TABLET | Freq: Every day | ORAL | Status: DC
  Administered 2021-11-14: 20 mg via ORAL
  Filled 2021-11-13: qty 1

## 2021-11-13 MED ORDER — EZETIMIBE 10 MG PO TABS
10.0000 mg | ORAL_TABLET | Freq: Every day | ORAL | Status: DC
  Administered 2021-11-14: 10 mg via ORAL
  Filled 2021-11-13: qty 1

## 2021-11-13 MED ORDER — SODIUM CHLORIDE 0.9 % IV SOLN
INTRAVENOUS | Status: AC
  Administered 2021-11-13: 50 mL/h via INTRAVENOUS

## 2021-11-13 MED ORDER — LIDOCAINE HCL 1 % IJ SOLN
INTRAMUSCULAR | Status: DC | PRN
  Administered 2021-11-13: 20 mL via INTRADERMAL

## 2021-11-13 MED ORDER — ONDANSETRON HCL 4 MG/2ML IJ SOLN
INTRAMUSCULAR | Status: DC | PRN
  Administered 2021-11-13: 4 mg via INTRAVENOUS

## 2021-11-13 MED ORDER — PROPOFOL 10 MG/ML IV BOLUS
INTRAVENOUS | Status: AC
  Filled 2021-11-13: qty 20

## 2021-11-13 MED ORDER — INSULIN ASPART 100 UNIT/ML IJ SOLN: 0.0000 [IU] | INTRAMUSCULAR | Status: DC | PRN

## 2021-11-13 MED ORDER — ASPIRIN 81 MG PO CHEW
81.0000 mg | CHEWABLE_TABLET | Freq: Every day | ORAL | Status: DC
  Administered 2021-11-14: 81 mg via ORAL
  Filled 2021-11-13: qty 1

## 2021-11-13 MED ORDER — SODIUM CHLORIDE 0.9% FLUSH: 3.0000 mL | Freq: Two times a day (BID) | INTRAVENOUS | Status: DC

## 2021-11-13 MED ORDER — ACETAMINOPHEN 325 MG PO TABS
650.0000 mg | ORAL_TABLET | Freq: Four times a day (QID) | ORAL | Status: DC | PRN
  Administered 2021-11-14: 650 mg via ORAL
  Filled 2021-11-13: qty 2

## 2021-11-13 MED ORDER — OXYBUTYNIN CHLORIDE ER 5 MG PO TB24
15.0000 mg | ORAL_TABLET | Freq: Every day | ORAL | Status: DC
  Administered 2021-11-13: 15 mg via ORAL
  Filled 2021-11-13: qty 1

## 2021-11-13 MED ORDER — MIDAZOLAM HCL 2 MG/2ML IJ SOLN
INTRAMUSCULAR | Status: DC | PRN
  Administered 2021-11-13: 2 mg via INTRAVENOUS

## 2021-11-13 MED ORDER — IODIXANOL 320 MG/ML IV SOLN
INTRAVENOUS | Status: DC | PRN
  Administered 2021-11-13: 100 mL

## 2021-11-13 MED ORDER — DEXAMETHASONE SODIUM PHOSPHATE 10 MG/ML IJ SOLN
INTRAMUSCULAR | Status: AC
  Filled 2021-11-13: qty 1

## 2021-11-13 SURGICAL SUPPLY — 70 items
BAG COUNTER SPONGE SURGICOUNT (BAG) ×2 IMPLANT
BAG DECANTER FOR FLEXI CONT (MISCELLANEOUS) IMPLANT
BAG SNAP BAND KOVER 36X36 (MISCELLANEOUS) IMPLANT
BALLN TRUE 18X4.5 (BALLOONS) ×2
BALLOON TRUE 18X4.5 (BALLOONS) IMPLANT
BLADE CLIPPER SURG (BLADE) IMPLANT
BLADE STERNUM SYSTEM 6 (BLADE) IMPLANT
CABLE ADAPT CONN TEMP 6FT (ADAPTER) ×2 IMPLANT
CANISTER SUCT 3000ML PPV (MISCELLANEOUS) IMPLANT
CATH DIAG EXPO 6F AL1 (CATHETERS) IMPLANT
CATH DIAG EXPO 6F VENT PIG 145 (CATHETERS) ×4 IMPLANT
CATH INFINITI 6F AL2 (CATHETERS) IMPLANT
CATH S G BIP PACING (CATHETERS) ×2 IMPLANT
CHLORAPREP W/TINT 26 (MISCELLANEOUS) ×2 IMPLANT
CLOSURE MYNX CONTROL 6F/7F (Vascular Products) IMPLANT
CLOSURE PERCLOSE PROSTYLE (VASCULAR PRODUCTS) IMPLANT
CNTNR URN SCR LID CUP LEK RST (MISCELLANEOUS) ×4 IMPLANT
CONT SPEC 4OZ STRL OR WHT (MISCELLANEOUS) ×4
COVER BACK TABLE 80X110 HD (DRAPES) IMPLANT
DERMABOND ADVANCED .7 DNX12 (GAUZE/BANDAGES/DRESSINGS) ×2 IMPLANT
DEVICE CLOSURE PERCLS PRGLD 6F (VASCULAR PRODUCTS) ×4 IMPLANT
DRSG TEGADERM 4X10 (GAUZE/BANDAGES/DRESSINGS) IMPLANT
DRSG TEGADERM 4X4.75 (GAUZE/BANDAGES/DRESSINGS) ×4 IMPLANT
ELECT REM PT RETURN 9FT ADLT (ELECTROSURGICAL) ×2
ELECTRODE REM PT RTRN 9FT ADLT (ELECTROSURGICAL) ×2 IMPLANT
GAUZE SPONGE 4X4 12PLY STRL (GAUZE/BANDAGES/DRESSINGS) ×2 IMPLANT
GAUZE SPONGE 4X4 16PLY XRAY LF (GAUZE/BANDAGES/DRESSINGS) IMPLANT
GLOVE BIO SURGEON STRL SZ8 (GLOVE) ×2 IMPLANT
GLOVE BIOGEL PI IND STRL 6.5 (GLOVE) IMPLANT
GLOVE ECLIPSE 7.0 STRL STRAW (GLOVE) ×2 IMPLANT
GOWN STRL REUS W/ TWL LRG LVL3 (GOWN DISPOSABLE) IMPLANT
GOWN STRL REUS W/ TWL XL LVL3 (GOWN DISPOSABLE) ×2 IMPLANT
GOWN STRL REUS W/TWL LRG LVL3 (GOWN DISPOSABLE)
GOWN STRL REUS W/TWL XL LVL3 (GOWN DISPOSABLE) ×10
GUIDEWIRE SAF TJ AMPL .035X180 (WIRE) ×2 IMPLANT
GUIDEWIRE SAFE TJ AMPLATZ EXST (WIRE) ×2 IMPLANT
KIT BASIN OR (CUSTOM PROCEDURE TRAY) ×2 IMPLANT
KIT HEART LEFT (KITS) ×2 IMPLANT
KIT SAPIAN 3 ULTRA RESILIA 23 (Valve) IMPLANT
KIT TURNOVER KIT B (KITS) ×2 IMPLANT
NS IRRIG 1000ML POUR BTL (IV SOLUTION) ×2 IMPLANT
PACK ENDO MINOR (CUSTOM PROCEDURE TRAY) ×2 IMPLANT
PAD ARMBOARD 7.5X6 YLW CONV (MISCELLANEOUS) ×4 IMPLANT
PAD ELECT DEFIB RADIOL ZOLL (MISCELLANEOUS) ×2 IMPLANT
PERCLOSE PROGLIDE 6F (VASCULAR PRODUCTS) ×4
POSITIONER HEAD DONUT 9IN (MISCELLANEOUS) ×2 IMPLANT
SET MICROPUNCTURE 5F STIFF (MISCELLANEOUS) ×2 IMPLANT
SHEATH BRITE TIP 7FR 35CM (SHEATH) ×2 IMPLANT
SHEATH PINNACLE 6F 10CM (SHEATH) ×2 IMPLANT
SHEATH PINNACLE 8F 10CM (SHEATH) ×2 IMPLANT
SLEEVE REPOSITIONING LENGTH 30 (MISCELLANEOUS) ×2 IMPLANT
SPIKE FLUID TRANSFER (MISCELLANEOUS) ×2 IMPLANT
SPONGE T-LAP 18X18 ~~LOC~~+RFID (SPONGE) ×2 IMPLANT
STOPCOCK MORSE 400PSI 3WAY (MISCELLANEOUS) ×4 IMPLANT
SUT SILK  1 MH (SUTURE) ×2
SUT SILK 1 MH (SUTURE) ×2 IMPLANT
SYR 50ML LL SCALE MARK (SYRINGE) ×2 IMPLANT
SYR BULB IRRIG 60ML STRL (SYRINGE) IMPLANT
TOWEL GREEN STERILE (TOWEL DISPOSABLE) ×4 IMPLANT
TRANSDUCER W/STOPCOCK (MISCELLANEOUS) ×4 IMPLANT
TRAY FOLEY SLVR 14FR TEMP STAT (SET/KITS/TRAYS/PACK) IMPLANT
TUBE SUCT INTRACARD DLP 20F (MISCELLANEOUS) IMPLANT
TUBING ART PRESS 72  MALE/FEM (TUBING) ×4
TUBING ART PRESS 72 MALE/FEM (TUBING) IMPLANT
TUBING CIL FLEX 10 FLL-RA (TUBING) IMPLANT
WIRE AMPLATZ SS-J .035X180CM (WIRE) IMPLANT
WIRE EMERALD 3MM-J .035X150CM (WIRE) ×2 IMPLANT
WIRE EMERALD 3MM-J .035X260CM (WIRE) ×2 IMPLANT
WIRE EMERALD ST .035X260CM (WIRE) ×4 IMPLANT
WIRE SAFARI SM CURVE 275 (WIRE) ×2 IMPLANT

## 2021-11-13 NOTE — Anesthesia Procedure Notes (Signed)
Procedure Name: MAC Date/Time: 11/13/2021 7:32 AM  Performed by: Lance Coon, CRNAPre-anesthesia Checklist: Patient identified, Emergency Drugs available, Suction available, Patient being monitored and Timeout performed Patient Re-evaluated:Patient Re-evaluated prior to induction Oxygen Delivery Method: Simple face mask Placement Confirmation: positive ETCO2 Dental Injury: Teeth and Oropharynx as per pre-operative assessment

## 2021-11-13 NOTE — Transfer of Care (Signed)
Immediate Anesthesia Transfer of Care Note  Patient: Cheryl Cooper  Procedure(s) Performed: Transcatheter Aortic Valve Replacement, Transfemoral Edwards 23 MM SAPIEN 3 Ultra (Chest) INTRAOPERATIVE TRANSTHORACIC ECHOCARDIOGRAM  Patient Location: Cath Lab  Anesthesia Type:MAC  Level of Consciousness: drowsy and patient cooperative  Airway & Oxygen Therapy: Patient Spontanous Breathing and Patient connected to nasal cannula oxygen  Post-op Assessment: Report given to RN and Post -op Vital signs reviewed and stable  Post vital signs: Reviewed and stable  Last Vitals:  Vitals Value Taken Time  BP 120/55 11/13/21 0929  Temp    Pulse 50 11/13/21 0931  Resp 13 11/13/21 0931  SpO2 93 % 11/13/21 0931  Vitals shown include unvalidated device data.  Last Pain:  Vitals:   11/13/21 0928  TempSrc:   PainSc: 0-No pain      Patients Stated Pain Goal: 0 (03/40/35 2481)  Complications: No notable events documented.

## 2021-11-13 NOTE — Op Note (Signed)
[facility]   MULTIDISCIPLINARY HEART VALVE TEAM   TAVR OPERATIVE NOTE   Date of Procedure:  11/13/2021  Preoperative Diagnosis: Severe Aortic Stenosis   Postoperative Diagnosis: Same   Procedure:   Transcatheter Aortic Valve Replacement - Percutaneous  Transfemoral Approach  Edwards Sapien 3 Ultra Resilia THV (size 23 mm, model number [id number], serial # [id number])   Co-Surgeons:  Coralie Common, MD and [name], MD  Anesthesiologist:  Annye Asa, MD  Echocardiographer:  Rudean Haskell, MD  Pre-operative Echo Findings: Severe bicuspid aortic stenosis Normal/vigorous left ventricular systolic function  Post-operative Echo Findings: No paravalvular leak Unchanged left ventricular systolic function  BRIEF CLINICAL NOTE AND INDICATIONS FOR SURGERY  81 year old woman with very severe bicuspid aortic valve stenosis, mean transvalvular gradient 69 mmHg and calculated aortic valve area of 0.46 cm.  She has developed New York Heart Association functional class II-III symptoms of exertional dyspnea and fatigue.  After multidisciplinary team evaluation and appropriate preoperative testing, she is referred for TAVR.  During the course of the patient's preoperative work up they have been evaluated comprehensively by a multidisciplinary team of specialists coordinated through the [facility] in the [facility].  They have been demonstrated to suffer from symptomatic severe aortic stenosis as noted above. The patient has been counseled extensively as to the relative risks and benefits of all options for the treatment of severe aortic stenosis including long term medical therapy, conventional surgery for aortic valve replacement, and transcatheter aortic valve replacement.  The patient has been independently evaluated in formal cardiac surgical consultation by Dr Cyndia Bent, who deemed the patient appropriate for TAVR.  Based upon review of all of the patient's preoperative diagnostic tests they are felt to be candidate for transcatheter aortic valve replacement using the transfemoral approach as an alternative to conventional surgery.    Following the decision to proceed with transcatheter aortic valve replacement, a discussion has been held regarding what types of management strategies would be attempted intraoperatively in the event of life-threatening complications, including whether or not the patient would be considered a candidate for the use of cardiopulmonary bypass and/or conversion to open sternotomy for attempted surgical intervention.  The patient has been advised of a variety of complications that might develop peculiar to this approach including but not limited to risks of death, stroke, paravalvular leak, aortic dissection or other major vascular complications, aortic annulus rupture, device embolization, cardiac rupture or perforation, acute myocardial infarction, arrhythmia, heart block or bradycardia requiring permanent pacemaker placement, congestive heart failure, respiratory failure, renal failure, pneumonia, infection, other late complications related to structural valve deterioration or migration, or other complications that might ultimately cause a temporary or permanent loss of functional independence or other long term morbidity.  The patient provides full informed consent for the procedure as described and all questions were answered preoperatively.  DETAILS OF THE OPERATIVE PROCEDURE  PREPARATION:   The patient is brought to the operating room on the above mentioned date and central monitoring was established by the anesthesia team including placement of a radial arterial line. The patient is placed in the supine position on the operating table.  Intravenous antibiotics are administered. The patient is monitored closely throughout the procedure under conscious sedation.  Baseline transthoracic  echocardiogram is performed. The patient's chest, abdomen, both groins, and both lower extremities are prepared and draped in a sterile manner. A time out procedure is performed.   PERIPHERAL ACCESS:   Using ultrasound guidance, femoral  arterial access is obtained with placement of 6 Fr sheath on the left side.  [place] images are digitally captured and stored in the patient's chart. A pigtail diagnostic catheter was passed through the femoral arterial sheath under fluoroscopic guidance into the aortic root.  The right femoral vein is accessed also under ultrasound guidance with a long 7 [place] sheath.  A temporary transvenous pacemaker catheter was passed through the femoral venous sheath under fluoroscopic guidance into the right ventricle.  The pacemaker was tested to ensure stable lead placement and pacemaker capture. Aortic root angiography was performed in order to determine the optimal angiographic angle for valve deployment.  TRANSFEMORAL ACCESS:  A micropuncture technique is used to access the right femoral artery under fluoroscopic and ultrasound guidance.  2 Perclose devices are deployed at 10' and 2' positions to 'PreClose' the femoral artery. An 8 French sheath is placed and then an Amplatz Superstiff wire is advanced through the sheath. This is changed out for a 14 French transfemoral E-Sheath after progressively dilating over the Superstiff wire.  An AL-1 catheter was used to direct a straight-tip exchange length wire across the native aortic valve into the left ventricle. This was exchanged out for a pigtail catheter and position was confirmed in the LV apex. Simultaneous LV and Ao pressures were recorded.  The pigtail catheter was exchanged for an Amplatz Extra-stiff wire in the LV apex.    BALLOON AORTIC VALVULOPLASTY:  Performed with rapid ventricular pacing at 180 bpm using a an 18 mm Bard true balloon.  The patient's hemodynamic recovery is rapid.  TRANSCATHETER HEART VALVE DEPLOYMENT:   An Edwards Sapien 3 transcatheter heart valve (size 23 mm) was prepared and crimped per manufacturer's guidelines, and the proper orientation of the valve is confirmed on the [employer] delivery system. The valve was advanced through the introducer sheath using normal technique until in an appropriate position in the abdominal aorta beyond the sheath tip. The balloon was then retracted and using the fine-tuning wheel was centered on the valve. The valve was then advanced across the aortic arch using appropriate flexion of the catheter. The valve was carefully positioned across the aortic valve annulus. The Commander catheter was retracted using normal technique. Once final position of the valve has been confirmed by angiographic assessment, the valve is deployed while temporarily holding ventilation and during rapid ventricular pacing to maintain systolic blood pressure < 50 mmHg and pulse pressure < 10 mmHg. The balloon inflation is held for >3 seconds after reaching full deployment volume. Once the balloon has fully deflated the balloon is retracted into the ascending aorta and valve function is assessed using echocardiography. The patient's hemodynamic recovery following valve deployment is good.  The deployment balloon and guidewire are both removed. Echo demostrated acceptable post-procedural gradients, stable mitral valve function, and no aortic insufficiency.    PROCEDURE COMPLETION:  The sheath was removed and femoral artery closure is performed using the 2 previously deployed Perclose devices.  Protamine is administered once femoral arterial repair was complete. The site is clear with no evidence of bleeding or hematoma after the sutures are tightened. The temporary pacemaker and pigtail catheters are removed. Mynx closure is used for contralateral femoral arterial hemostasis for the 6 Fr sheath.  The patient tolerated the procedure well and is transported to the recovery area in stable  condition. There were no immediate intraoperative complications. All sponge instrument and needle counts are verified correct at completion of the operation.   The patient received a  total of 60 mL of intravenous contrast during the procedure.   [name], MD 11/13/2021 9:25 AM

## 2021-11-13 NOTE — Consult Note (Incomplete)
[place] VALVE TEAM  Discharge Summary    Patient ID: Cheryl Cooper MRN: [id number]; DOB: 08-Jun-1940  Admit date: 11/13/2021 Discharge date: 11/13/2021  Primary Care Provider: Libby Maw, MD  Primary Cardiologist: Evalina Field, MD / Dr. Burt Knack & Dr. Lavonna Monarch (TAVR)  Discharge Diagnoses    Principal Problem:   S/P TAVR (transcatheter aortic valve replacement) Active Problems:   PUD (peptic ulcer disease)   Hyperlipidemia   Type 2 diabetes, diet controlled (HCC)   Chronic back pain   Anemia   Depression   Severe aortic stenosis   Allergies Allergies  Allergen Reactions   Other Other (See Comments) and Rash    Tomato sauce, garlic, onion - severe acid reflux    Flexeril [Cyclobenzaprine] Other (See Comments)    Per spouse "she felt like a zombie"   Chlordiazepoxide-Clidinium Other (See Comments)    Dizziness (intolerance)   Codeine Nausea Only and Rash   Lipitor [Atorvastatin] Other (See Comments)    Unbalanced    Diagnostic Studies/Procedures    TAVR OPERATIVE NOTE     Date of Procedure:                11/13/2021   Preoperative Diagnosis:      Severe Aortic Stenosis    Postoperative Diagnosis:    Same    Procedure:        Transcatheter Aortic Valve Replacement - Percutaneous  Transfemoral Approach             Edwards Sapien 3 Ultra Resilia THV (size 23 mm, model number [id number], serial # [id number])              Co-Surgeons:                        Coralie Common, MD and [name], MD   Anesthesiologist:                  Annye Asa, MD   Echocardiographer:              Rudean Haskell, MD   Pre-operative Echo Findings: Severe bicuspid aortic stenosis Normal/vigorous left ventricular systolic function   Post-operative Echo Findings: No paravalvular leak Unchanged left ventricular systolic function _____________    Echo 11/14/21: completed but pending formal read at the time of  discharge   History of Present Illness     Cheryl Cooper is a 81 y.o. female with a history of HTN, HLD, GERD, hypothyroidism, diet controlled DMT2 and severe AS who presented to [facility]21 for planned TAVR.    She has been followed for aortic stenosis for the past 18 months.  Her most recent echo 10/08/21 showed EF 70%, with very severe AS with a mean grad 69 mmHg, peak grad 93.7 mmHg, AVA 0.46 cm2, DVI 0.18. She reported progressive symptoms of exertional dyspnea as well as fatigue and intermittent dizziness. Additionally, she will require back surgery and it was recommended that her aortic stenosis be addressed first. She was referred to Dr. Burt Knack for evaluation. [facility] 10/24/21 showed patent coronary arteries (right dominant) with a moderate ostial diagonal stenosis and no other significant coronary artery disease. Normal right heart pressures and cardiac output.  The patient has been evaluated by the multidisciplinary valve team and felt to have severe, symptomatic aortic stenosis and to be a suitable candidate for TAVR, which was set up for 11/13/21.  Hospital Course  Consultants: none   Severe AS: s/p successful TAVR with a 23 mm Edwards Sapien 3 Ultra Resilia THV via the TF approach on 11/13/21. Post operative echo completed but pending formal read. Groin sites are stable. ECG with *** and no high grade heart block. Started on a baby Asprin. Plan for discharge home today with close follow up in the office next week.   HTN: BP well controlled. Resume home meds  DMT2: diet controlled.   _____________  Discharge Vitals Blood pressure (!) 117/56, pulse (!) 44, temperature 97.9 F (36.6 C), resp. rate (!) 9, height '5\' 3"'$  (1.6 m), weight 62.6 kg, SpO2 90 %.  Filed Weights   11/13/21 0544  Weight: 62.6 kg     GEN: Well nourished, well developed, in no acute distress HEENT: normal Neck: no JVD or masses Cardiac: ***RRR; no murmurs, rubs, or gallops,no edema  Respiratory:   clear to auscultation bilaterally, normal work of breathing GI: soft, nontender, nondistended, + BS MS: no deformity or atrophy Skin: warm and dry, no rash Neuro:  Alert and Oriented x 3, Strength and sensation are intact Psych: euthymic mood, full affect   Labs & Radiologic Studies    CBC Recent Labs    11/13/21 0845 11/13/21 0908  HGB 11.2* 11.2*  HCT 33.0* 58.8*   Basic Metabolic Panel Recent Labs    11/13/21 0809 11/13/21 0845 11/13/21 0908  NA 139 139 138  K 4.1 4.1 4.2  CL 102 103  --   GLUCOSE 120* 166*  --   BUN 10 9  --   CREATININE 0.80 0.70  --    Liver Function Tests No results for input(s): "AST", "ALT", "ALKPHOS", "BILITOT", "PROT", "ALBUMIN" in the last 72 hours. No results for input(s): "LIPASE", "AMYLASE" in the last 72 hours. Cardiac Enzymes No results for input(s): "CKTOTAL", "CKMB", "CKMBINDEX", "TROPONINI" in the last 72 hours. BNP Invalid input(s): "POCBNP" D-Dimer No results for input(s): "DDIMER" in the last 72 hours. Hemoglobin A1C No results for input(s): "HGBA1C" in the last 72 hours. Fasting Lipid Panel No results for input(s): "CHOL", "HDL", "LDLCALC", "TRIG", "CHOLHDL", "LDLDIRECT" in the last 72 hours. Thyroid Function Tests No results for input(s): "TSH", "T4TOTAL", "T3FREE", "THYROIDAB" in the last 72 hours.  Invalid input(s): "FREET3" _____________  Structural Heart Procedure  Result Date: 11/13/2021 See surgical note for result.  DG Chest 2 View  Result Date: 11/12/2021 CLINICAL DATA:  Severe aortic stenosis.  Preop exam. EXAM: CHEST - 2 VIEW COMPARISON:  Chest x-ray dated 11/28/2018 FINDINGS: Heart size and mediastinal contours are within normal limits. Atherosclerosis is seen at the aortic arch. Lungs are clear. No pleural effusion is seen. No acute-appearing osseous abnormality. Bilateral shoulder arthroplasty hardware in place. IMPRESSION: 1. No active cardiopulmonary disease.  Lungs are clear. 2. Atherosclerosis at the  aortic arch. Electronically Signed   By: Franki Cabot M.D.   On: 11/12/2021 09:43   CT CORONARY MORPH W/CTA COR W/SCORE W/CA W/CM &/OR WO/CM  Addendum Date: 11/06/2021   ADDENDUM REPORT: 11/06/2021 14:26 ADDENDUM: CLINICAL DATA: 81 year old female with a history of aortic valve disease referred for TAVR evaluation EXAM: CT ANGIOGRAPHY CHEST, ABDOMEN AND PELVIS TECHNIQUE: Multidetector CT imaging through the chest, abdomen and pelvis was performed using the standard protocol during bolus administration of intravenous contrast. Multiplanar reconstructed images and MIPs were obtained and reviewed to evaluate the vascular anatomy. RADIATION DOSE REDUCTION: This exam was performed according to the departmental dose-optimization program which includes automated exposure control, adjustment of the mA  and/or kV according to patient size and/or use of iterative reconstruction technique. CONTRAST: 54m OMNIPAQUE IOHEXOL 350 MG/ML SOLN COMPARISON: 08/10/2020, 03/26/2020 FINDINGS: CTA CHEST FINDINGS Cardiovascular: Heart: No cardiomegaly. No pericardial fluid/thickening. Calcifications of left main, left anterior descending, circumflex coronary arteries. Aorta: Dense calcifications of the aortic valve. Greatest estimated diameter of the aortic annulus 26 mm on the coronal reformatted images. Greatest estimated diameter at the sinuses of Valsalva on the coronal images, 29 mm. Greatest estimated diameter of the sino-tubular junction, 25 mm on the coronal images. Greatest estimated diameter of the ascending aorta on the axial images, 30 mm. Mild atherosclerotic changes of the aorta. Branch vessels are patent with independent origin innominate artery, left common carotid artery, left vertebral artery, left subclavian artery. Cervical cerebral vessels patent at the base of the neck. Incidental imaging of the distal common carotid arteries demonstrating calcified atherosclerosis. No pedunculated plaque, ulcerated plaque,  dissection, periaortic fluid. No wall thickening. Pulmonary arteries: Timing of the contrast bolus is not optimized for evaluation of pulmonary artery filling defects. Unremarkable size of the main pulmonary artery. Mediastinum/Nodes: No mediastinal adenopathy. Unremarkable appearance of the thoracic esophagus. Unremarkable appearance of the thoracic inlet. Lungs/Pleura: Central airways are clear. No pleural effusion. No confluent airspace disease. No pneumothorax. CTA ABDOMEN AND PELVIS FINDINGS VASCULAR Aorta: Moderate atherosclerotic plaque of the abdominal aorta. No dissection, pedunculated plaque, ulcerated plaque. Mild irregular soft plaque in the distal abdominal aorta, below the IMA origin results in no significant narrowing of the flow channel, estimated greater than 11 mm. No periaortic fluid or inflammatory changes. Celiac: The celiac artery patent with no significant atherosclerotic changes. Celiac artery contributes to common hepatic artery/proper hepatic artery only. There is a separate origin of the splenic artery, which contributes to the left gastric artery. SMA: Moderate atherosclerotic changes of the SMA origin, estimated 50% narrowed. Renals: - Right: Single right renal artery. Calcified atherosclerotic plaque at the origin with less than 50% narrowing. - Left: There are 2 left renal arteries. The dominant more superior demonstrates atherosclerotic calcifications at the origin, with estimated 50% narrowing. The more inferior is patent, small caliber. IMA: Inferior mesenteric artery is patent. Right lower extremity: Mild atherosclerotic plaque of the right iliac system with mild tortuosity. Native diameter right common iliac artery: 8-9 mm Narrowing at the site of calcified plaque, proximal right common iliac artery: 6-7 mm Hypogastric artery is patent and pelvic arteries remain patent. Native diameter right external iliac artery: 7 mm. No significant atherosclerotic plaque of the external iliac  artery. Minimal atherosclerotic plaque of the right common femoral artery, with calcifications on the posterior wall. Native diameter of the right common femoral artery estimated 7 mm Proximal profunda femoris and SFA patent. Left lower extremity: Mild atherosclerotic changes of the left iliac system without high-grade stenosis. Native diameter left common iliac artery: 8-9 mm. Hypogastric artery patent. Pelvic arteries patent. Native diameter of the external iliac artery 6-7 mm. Mild to moderate atherosclerotic changes of the left common femoral artery. Anterior calcified plaque just above the bifurcation extending into the SFA. Proximal profunda femoris and SFA patent. Native diameter of the left common femoral artery is estimated 8 mm. Veins: Unremarkable appearance of the portal system. Unremarkable appearance of the IVC. Unremarkable bilateral renal veins. Unremarkable iliac veins. Unremarkable proximal right femoral veins. Calcifications of the left saphenofemoral junction. Review of the MIP images confirms the above findings. NON-VASCULAR Hepatobiliary: Cirrhotic morphology of the liver again demonstrated with enlarged caudate lobe and some mild nodularity on the surface  of the liver. No focal lesion. Cholecystectomy Pancreas: Unremarkable. Spleen: Unremarkable. Adrenals/Urinary Tract: - Right adrenal gland: Unremarkable - Left adrenal gland: Unremarkable. - Right kidney: No hydronephrosis, nephrolithiasis, inflammation, or ureteral dilation. No focal lesion. - Left Kidney: No hydronephrosis, nephrolithiasis, inflammation, or ureteral dilation. No focal lesion. - Urinary Bladder: Urinary bladder decompressed, unremarkable. Stomach/Bowel: - Stomach: Hiatal hernia. - Small bowel: Unremarkable - Appendix: Normal. - Colon: Moderate stool burden. Advanced left-sided diverticular disease. No acute inflammatory changes. Lymphatic: No adenopathy. Mesenteric: No free fluid or air. No mesenteric adenopathy.  Reproductive: Hysterectomy Other: No hernia. Musculoskeletal: Multilevel degenerative changes of the visualized cervical, thoracic, lumbar spine. No bony canal narrowing. No acute displaced fracture. Surgical changes of the bilateral shoulders. Surgical changes of right hip. No aggressive lytic or sclerotic lesions. IMPRESSION: Dense calcifications of the aortic valve, with associated coronary artery disease. Mild atherosclerosis of the thoracic and abdominal aorta, without dissection, pedunculated plaque, ulcerated plaque, aneurysm, or narrowing. Aortic Atherosclerosis (ICD10-I70.0). Bilateral CIA are estimated 8-9 mm in native diameter with minimal atherosclerosis on the right, which is estimated to focally narrow the right CIA lumen to 6-7 mm. Bilateral EIA estimated 7-8 mm in native diameter without significant atherosclerotic changes. Mild bilateral common femoral artery atherosclerosis. 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 Cirrhotic morphology of the liver. Diverticular disease without evidence of acute diverticulitis. Additional ancillary findings as above. Signed, Dulcy Fanny. Nadene Rubins, RPVI [facility] Radiology Electronically Signed By: Corrie Mckusick D.O. On: 11/02/2021 11:57 Electronically Signed   By: Corrie Mckusick D.O.   On: 11/06/2021 14:26   Result Date: 11/06/2021 CLINICAL DATA:  Severe Aortic Stenosis. EXAM: Cardiac TAVR CT TECHNIQUE: A non-contrast, gated CT scan was obtained with axial slices of 3 mm through the heart for aortic valve calcium scoring. A 90 kV retrospective, gated, contrast cardiac scan was obtained. Gantry rotation speed was 250 msecs and collimation was 0.6 mm. Nitroglycerin was not given. The 3D data set was reconstructed in 5% intervals of the 0-95% of the R-R cycle. Systolic and diastolic phases were analyzed on a dedicated workstation using MPR, MIP, and VRT modes. The patient received 100 cc of  contrast. FINDINGS: Image quality: Excellent. Noise artifact is: Limited. Valve Morphology: Bicuspid aortic valve with fusion of the RCC/LCC with raphe (Sievers type 1). Severe calcifications of the leaflets with severely restricted movement in systole. Aortic Valve Calcium score: 1808 Aortic annular dimension: Phase assessed: 15% Annular area: 401 mm2 Annular perimeter: 73.4 mm Max diameter: 26.6 mm Min diameter: 19.7 mm Annular and subannular calcification: Minimal calcification under the RCC. Membranous septum length: 5.0 mm Optimal coplanar projection: RAO 5 CAU 7 Coronary Artery Height above Annulus: Left Main: 11.6 mm Right Coronary: 16.0 mm Sinus of Valsalva Measurements: Non-coronary: 28.5 mm Right-coronary: 27.0 mm Left-coronary: 28.0 mm Sinus of Valsalva Height: Non-coronary: 20.4 mm Right-coronary: 19.6 mm Left-coronary: 17.8 mm Sinotubular Junction: 26 mm Ascending Thoracic Aorta: 30 mm Coronary Arteries: Normal coronary origin. Right dominance. The study was performed without use of NTG and is insufficient for plaque evaluation. Please refer to recent cardiac catheterization for coronary assessment. Coronary calcifications noted in the LAD/LCX. Cardiac Morphology: Right Atrium: Right atrial size is within normal limits. Right Ventricle: The right ventricular cavity is within normal limits. Left Atrium: Left atrial size is normal in size with no left atrial appendage filling defect. Left Ventricle: The ventricular cavity size is within normal limits. Pulmonary arteries: Normal in size without proximal filling defect.  Pulmonary veins: Normal pulmonary venous drainage. Pericardium: Normal thickness with no significant effusion or calcium present. Mitral Valve: The mitral valve is degenerative with moderate annular calcium. Extra-cardiac findings: See attached radiology report for non-cardiac structures. IMPRESSION: 1. Bicuspid aortic valve with fusion of the RCC/LCC with raphe (Sievers type 1). 2. Annular  measurements support a 23 mm S3 (401 mm2). Would recommend against 29 mm Evlot as sinuses do not support this device. 3. No significant annular or subannular calcifications. 4. Sufficient coronary to annulus distance. 5. Optimal Fluoroscopic Angle for Delivery: RAO 5 CAU [place]. Audie Box, MD Electronically Signed: By: Eleonore Chiquito M.D. On: 11/02/2021 10:53   CT ANGIO ABDOMEN PELVIS  W &/OR WO CONTRAST  Result Date: 11/02/2021 CLINICAL DATA:  81 year old female with a history of aortic valve disease referred for TAVR evaluation EXAM: CT ANGIOGRAPHY CHEST, ABDOMEN AND PELVIS TECHNIQUE: Multidetector CT imaging through the chest, abdomen and pelvis was performed using the standard protocol during bolus administration of intravenous contrast. Multiplanar reconstructed images and MIPs were obtained and reviewed to evaluate the vascular anatomy. RADIATION DOSE REDUCTION: This exam was performed according to the departmental dose-optimization program which includes automated exposure control, adjustment of the mA and/or kV according to patient size and/or use of iterative reconstruction technique. CONTRAST:  8m OMNIPAQUE IOHEXOL 350 MG/ML SOLN COMPARISON:  08/10/2020, 03/26/2020 FINDINGS: CTA CHEST FINDINGS Cardiovascular: Heart: No cardiomegaly. No pericardial fluid/thickening. Calcifications of left main, left anterior descending, circumflex coronary arteries. Aorta: Dense calcifications of the aortic valve. Greatest estimated diameter of the aortic annulus 26 mm on the coronal reformatted images. Greatest estimated diameter at the sinuses of Valsalva on the coronal images, 29 mm. Greatest estimated diameter of the sino-tubular junction, 25 mm on the coronal images. Greatest estimated diameter of the ascending aorta on the axial images, 30 mm. Mild atherosclerotic changes of the aorta. Branch vessels are patent with independent origin innominate artery, left common carotid artery, left vertebral artery, left  subclavian artery. Cervical cerebral vessels patent at the base of the neck. Incidental imaging of the distal common carotid arteries demonstrating calcified atherosclerosis. No pedunculated plaque, ulcerated plaque, dissection, periaortic fluid. No wall thickening. Pulmonary arteries: Timing of the contrast bolus is not optimized for evaluation of pulmonary artery filling defects. Unremarkable size of the main pulmonary artery. Mediastinum/Nodes: No mediastinal adenopathy. Unremarkable appearance of the thoracic esophagus. Unremarkable appearance of the thoracic inlet. Lungs/Pleura: Central airways are clear. No pleural effusion. No confluent airspace disease. No pneumothorax. CTA ABDOMEN AND PELVIS FINDINGS VASCULAR Aorta: Moderate atherosclerotic plaque of the abdominal aorta. No dissection, pedunculated plaque, ulcerated plaque. Mild irregular soft plaque in the distal abdominal aorta, below the IMA origin results in no significant narrowing of the flow channel, estimated greater than 11 mm. No periaortic fluid or inflammatory changes. Celiac: The celiac artery patent with no significant atherosclerotic changes. Celiac artery contributes to common hepatic artery/proper hepatic artery only. There is a separate origin of the splenic artery, which contributes to the left gastric artery. SMA: Moderate atherosclerotic changes of the SMA origin, estimated 50% narrowed. Renals: - Right: Single right renal artery. Calcified atherosclerotic plaque at the origin with less than 50% narrowing. - Left: There are 2 left renal arteries. The dominant more superior demonstrates atherosclerotic calcifications at the origin, with estimated 50% narrowing. The more inferior is patent, small caliber. IMA: Inferior mesenteric artery is patent. Right lower extremity: Mild atherosclerotic plaque of the right iliac system with mild tortuosity. Native diameter right common iliac artery: 8-9  mm Narrowing at the site of calcified plaque,  proximal right common iliac artery: 6-7 mm Hypogastric artery is patent and pelvic arteries remain patent. Native diameter right external iliac artery: 7 mm. No significant atherosclerotic plaque of the external iliac artery. Minimal atherosclerotic plaque of the right common femoral artery, with calcifications on the posterior wall. Native diameter of the right common femoral artery estimated 7 mm Proximal profunda femoris and SFA patent. Left lower extremity: Mild atherosclerotic changes of the left iliac system without high-grade stenosis. Native diameter left common iliac artery: 8-9 mm. Hypogastric artery patent.  Pelvic arteries patent. Native diameter of the external iliac artery 6-7 mm. Mild to moderate atherosclerotic changes of the left common femoral artery. Anterior calcified plaque just above the bifurcation extending into the SFA. Proximal profunda femoris and SFA patent. Native diameter of the left common femoral artery is estimated 8 mm. Veins: Unremarkable appearance of the portal system. Unremarkable appearance of the IVC. Unremarkable bilateral renal veins. Unremarkable iliac veins. Unremarkable proximal right femoral veins. Calcifications of the left saphenofemoral junction. Review of the MIP images confirms the above findings. NON-VASCULAR Hepatobiliary: Cirrhotic morphology of the liver again demonstrated with enlarged caudate lobe and some mild nodularity on the surface of the liver. No focal lesion. Cholecystectomy Pancreas: Unremarkable. Spleen: Unremarkable. Adrenals/Urinary Tract: - Right adrenal gland: Unremarkable - Left adrenal gland: Unremarkable. - Right kidney: No hydronephrosis, nephrolithiasis, inflammation, or ureteral dilation. No focal lesion. - Left Kidney: No hydronephrosis, nephrolithiasis, inflammation, or ureteral dilation. No focal lesion. - Urinary Bladder: Urinary bladder decompressed, unremarkable. Stomach/Bowel: - Stomach: Hiatal hernia. - Small bowel: Unremarkable -  Appendix: Normal. - Colon: Moderate stool burden. Advanced left-sided diverticular disease. No acute inflammatory changes. Lymphatic: No adenopathy. Mesenteric: No free fluid or air. No mesenteric adenopathy. Reproductive: Hysterectomy Other: No hernia. Musculoskeletal: Multilevel degenerative changes of the visualized cervical, thoracic, lumbar spine. No bony canal narrowing. No acute displaced fracture. Surgical changes of the bilateral shoulders. Surgical changes of right hip. No aggressive lytic or sclerotic lesions. IMPRESSION: Dense calcifications of the aortic valve, with associated coronary artery disease. Mild atherosclerosis of the thoracic and abdominal aorta, without dissection, pedunculated plaque, ulcerated plaque, aneurysm, or narrowing. Aortic Atherosclerosis (ICD10-I70.0). Bilateral CIA are estimated 8-9 mm in native diameter with minimal atherosclerosis on the right, which is estimated to focally narrow the right CIA lumen to 6-7 mm. Bilateral EIA estimated 7-8 mm in native diameter without significant atherosclerotic changes. Mild bilateral common femoral artery atherosclerosis. 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 Cirrhotic morphology of the liver. Diverticular disease without evidence of acute diverticulitis. Additional ancillary findings as above. Signed, Dulcy Fanny. Nadene Rubins, RPVI [facility] Radiology Electronically Signed   By: Corrie Mckusick D.O.   On: 11/02/2021 11:57   CT ANGIO CHEST AORTA W/CM & OR WO/CM  Result Date: 11/02/2021 CLINICAL DATA:  81 year old female with a history of aortic valve disease referred for TAVR evaluation EXAM: CT ANGIOGRAPHY CHEST, ABDOMEN AND PELVIS TECHNIQUE: Multidetector CT imaging through the chest, abdomen and pelvis was performed using the standard protocol during bolus administration of intravenous contrast. Multiplanar reconstructed images and MIPs were obtained and  reviewed to evaluate the vascular anatomy. RADIATION DOSE REDUCTION: This exam was performed according to the departmental dose-optimization program which includes automated exposure control, adjustment of the mA and/or kV according to patient size and/or use of iterative reconstruction technique. CONTRAST:  68m OMNIPAQUE IOHEXOL 350 MG/ML SOLN COMPARISON:  08/10/2020, 03/26/2020 FINDINGS: CTA  CHEST FINDINGS Cardiovascular: Heart: No cardiomegaly. No pericardial fluid/thickening. Calcifications of left main, left anterior descending, circumflex coronary arteries. Aorta: Dense calcifications of the aortic valve. Greatest estimated diameter of the aortic annulus 26 mm on the coronal reformatted images. Greatest estimated diameter at the sinuses of Valsalva on the coronal images, 29 mm. Greatest estimated diameter of the sino-tubular junction, 25 mm on the coronal images. Greatest estimated diameter of the ascending aorta on the axial images, 30 mm. Mild atherosclerotic changes of the aorta. Branch vessels are patent with independent origin innominate artery, left common carotid artery, left vertebral artery, left subclavian artery. Cervical cerebral vessels patent at the base of the neck. Incidental imaging of the distal common carotid arteries demonstrating calcified atherosclerosis. No pedunculated plaque, ulcerated plaque, dissection, periaortic fluid. No wall thickening. Pulmonary arteries: Timing of the contrast bolus is not optimized for evaluation of pulmonary artery filling defects. Unremarkable size of the main pulmonary artery. Mediastinum/Nodes: No mediastinal adenopathy. Unremarkable appearance of the thoracic esophagus. Unremarkable appearance of the thoracic inlet. Lungs/Pleura: Central airways are clear. No pleural effusion. No confluent airspace disease. No pneumothorax. CTA ABDOMEN AND PELVIS FINDINGS VASCULAR Aorta: Moderate atherosclerotic plaque of the abdominal aorta. No dissection, pedunculated  plaque, ulcerated plaque. Mild irregular soft plaque in the distal abdominal aorta, below the IMA origin results in no significant narrowing of the flow channel, estimated greater than 11 mm. No periaortic fluid or inflammatory changes. Celiac: The celiac artery patent with no significant atherosclerotic changes. Celiac artery contributes to common hepatic artery/proper hepatic artery only. There is a separate origin of the splenic artery, which contributes to the left gastric artery. SMA: Moderate atherosclerotic changes of the SMA origin, estimated 50% narrowed. Renals: - Right: Single right renal artery. Calcified atherosclerotic plaque at the origin with less than 50% narrowing. - Left: There are 2 left renal arteries. The dominant more superior demonstrates atherosclerotic calcifications at the origin, with estimated 50% narrowing. The more inferior is patent, small caliber. IMA: Inferior mesenteric artery is patent. Right lower extremity: Mild atherosclerotic plaque of the right iliac system with mild tortuosity. Native diameter right common iliac artery: 8-9 mm Narrowing at the site of calcified plaque, proximal right common iliac artery: 6-7 mm Hypogastric artery is patent and pelvic arteries remain patent. Native diameter right external iliac artery: 7 mm. No significant atherosclerotic plaque of the external iliac artery. Minimal atherosclerotic plaque of the right common femoral artery, with calcifications on the posterior wall. Native diameter of the right common femoral artery estimated 7 mm Proximal profunda femoris and SFA patent. Left lower extremity: Mild atherosclerotic changes of the left iliac system without high-grade stenosis. Native diameter left common iliac artery: 8-9 mm. Hypogastric artery patent.  Pelvic arteries patent. Native diameter of the external iliac artery 6-7 mm. Mild to moderate atherosclerotic changes of the left common femoral artery. Anterior calcified plaque just above the  bifurcation extending into the SFA. Proximal profunda femoris and SFA patent. Native diameter of the left common femoral artery is estimated 8 mm. Veins: Unremarkable appearance of the portal system. Unremarkable appearance of the IVC. Unremarkable bilateral renal veins. Unremarkable iliac veins. Unremarkable proximal right femoral veins. Calcifications of the left saphenofemoral junction. Review of the MIP images confirms the above findings. NON-VASCULAR Hepatobiliary: Cirrhotic morphology of the liver again demonstrated with enlarged caudate lobe and some mild nodularity on the surface of the liver. No focal lesion. Cholecystectomy Pancreas: Unremarkable. Spleen: Unremarkable. Adrenals/Urinary Tract: - Right adrenal gland: Unremarkable - Left adrenal gland: Unremarkable. -  Right kidney: No hydronephrosis, nephrolithiasis, inflammation, or ureteral dilation. No focal lesion. - Left Kidney: No hydronephrosis, nephrolithiasis, inflammation, or ureteral dilation. No focal lesion. - Urinary Bladder: Urinary bladder decompressed, unremarkable. Stomach/Bowel: - Stomach: Hiatal hernia. - Small bowel: Unremarkable - Appendix: Normal. - Colon: Moderate stool burden. Advanced left-sided diverticular disease. No acute inflammatory changes. Lymphatic: No adenopathy. Mesenteric: No free fluid or air. No mesenteric adenopathy. Reproductive: Hysterectomy Other: No hernia. Musculoskeletal: Multilevel degenerative changes of the visualized cervical, thoracic, lumbar spine. No bony canal narrowing. No acute displaced fracture. Surgical changes of the bilateral shoulders. Surgical changes of right hip. No aggressive lytic or sclerotic lesions. IMPRESSION: Dense calcifications of the aortic valve, with associated coronary artery disease. Mild atherosclerosis of the thoracic and abdominal aorta, without dissection, pedunculated plaque, ulcerated plaque, aneurysm, or narrowing. Aortic Atherosclerosis (ICD10-I70.0). Bilateral CIA are  estimated 8-9 mm in native diameter with minimal atherosclerosis on the right, which is estimated to focally narrow the right CIA lumen to 6-7 mm. Bilateral EIA estimated 7-8 mm in native diameter without significant atherosclerotic changes. Mild bilateral common femoral artery atherosclerosis. 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 Cirrhotic morphology of the liver. Diverticular disease without evidence of acute diverticulitis. Additional ancillary findings as above. Signed, Dulcy Fanny. Nadene Rubins, RPVI [facility] Electronically Signed   By: Corrie Mckusick D.O.   On: 11/02/2021 11:57   CARDIAC CATHETERIZATION  Result Date: 10/24/2021   1st Diag lesion is 60% stenosed. 1.  Patent coronary arteries (right dominant) with a moderate ostial diagonal stenosis and no other significant coronary artery disease 2.  Normal right heart pressures and cardiac output 3.  Severe calcification and restriction of the aortic valve leaflets seen on plain fluoroscopy with known severe aortic stenosis.  Severe calcification also identified in the aortic root Recommendations: Continue evaluation for TAVR.  Patient will undergo CTA studies and formal cardiac surgical consultation as part of a multidisciplinary approach to her care.    Disposition   Pt is being discharged home today in good condition.  Follow-up Plans & Appointments     Follow-up Information     Eileen Stanford, PA-C. Go on 11/21/2021.   Specialties: Cardiology, Radiology Why: @ 1:30pm, please arrive at least 10 min early. Contact information: [place] [address] [phone]                  Discharge Medications   Allergies as of 11/13/2021       Reactions   Other Other (See Comments), Rash   Tomato sauce, garlic, onion - severe acid reflux    Flexeril [cyclobenzaprine] Other (See Comments)   Per spouse "she felt  like a zombie"   Chlordiazepoxide-clidinium Other (See Comments)   Dizziness (intolerance)   Codeine Nausea Only, Rash   Lipitor [atorvastatin] Other (See Comments)   Unbalanced     Med Rec must be completed prior to using this [place]***           Outstanding Labs/Studies   ***  Duration of Discharge Encounter   Greater than 30 minutes including physician time.  Mable Fill, PA-C 11/13/2021, 9:46 AM [phone]

## 2021-11-13 NOTE — Discharge Instructions (Signed)

## 2021-11-13 NOTE — Interval H&P Note (Signed)
History and Physical Interval Note:  11/13/2021 6:26 AM  Cheryl Cooper  has presented today for surgery, with the diagnosis of Severe Aortic Stenosis.  The various methods of treatment have been discussed with the patient and family. After consideration of risks, benefits and other options for treatment, the patient has consented to  Procedure(s): Transcatheter Aortic Valve Replacement, Transfemoral (N/A) INTRAOPERATIVE TRANSTHORACIC ECHOCARDIOGRAM (N/A) as a surgical intervention.  The patient's history has been reviewed, patient examined, no change in status, stable for surgery.  I have reviewed the patient's chart and labs.  Questions were answered to the patient's satisfaction.     Coralie Common

## 2021-11-13 NOTE — Progress Notes (Signed)
  [place] VALVE TEAM  Patient doing well s/p TAVR. She is hemodynamically stable. Groin sites stable. ECG with sinus brady with new 1st deg AV block but no high grade block. Arterial line discontinued and transferred to 4E. Plan for early ambulation after bedrest completed and hopeful discharge over the next 24-48 hours.   Angelena Form PA-C  [facility]  Pager [phone]

## 2021-11-13 NOTE — Anesthesia Postprocedure Evaluation (Signed)
Anesthesia Post Note  Patient: Cheryl Cooper  Procedure(s) Performed: Transcatheter Aortic Valve Replacement, Transfemoral Edwards 23 MM SAPIEN 3 Ultra (Chest) INTRAOPERATIVE TRANSTHORACIC ECHOCARDIOGRAM     Patient location during evaluation: Cath Lab Anesthesia Type: MAC Level of consciousness: awake and alert, patient cooperative and oriented Pain management: pain level controlled Vital Signs Assessment: post-procedure vital signs reviewed and stable Respiratory status: nonlabored ventilation, spontaneous breathing, respiratory function stable and patient connected to nasal cannula oxygen Cardiovascular status: blood pressure returned to baseline and stable Postop Assessment: no apparent nausea or vomiting Anesthetic complications: no   No notable events documented.  Last Vitals:  Vitals:   11/13/21 1100 11/13/21 1101  BP: (!) 104/56 (!) 104/56  Pulse: (!) 49 (!) 53  Resp: 14 17  Temp:    SpO2: 94% 94%    Last Pain:  Vitals:   11/13/21 0928  TempSrc:   PainSc: 0-No pain                 Kitt Minardi,E. Arlinda Barcelona

## 2021-11-13 NOTE — TOC Initial Note (Signed)
Transition of Care [facility]) - Initial/Assessment Note    Patient Details  Name: Cheryl Cooper MRN: [id number] Date of Birth: 05-15-1940  Transition of [facility] CM/SW Contact:    Ninfa Meeker, RN Phone Number: 11/13/2021, 3:39 PM  Clinical Narrative:                 Transition of Care Screening Note:  Transition of Care Department [facility]) has reviewed patient and no TOC needs have been identified at this time. We will continue to monitor patient advancement through Interdisciplinary progressions. If new patient transition needs arise, please place a consult.          Patient Goals and CMS Choice        Expected Discharge Plan and Services                                                Prior Living Arrangements/Services                       Activities of Daily Living      Permission Sought/Granted                  Emotional Assessment              Admission diagnosis:  S/P TAVR (transcatheter aortic valve replacement) [Z95.2] Patient Active Problem List   Diagnosis Date Noted   S/P TAVR (transcatheter aortic valve replacement) 11/13/2021   Severe aortic stenosis    Urine frequency 07/26/2021   Depression 06/25/2021   S/P shoulder replacement, right 04/20/2021   Slow transit constipation 03/20/2021   Anemia 03/20/2021   Tick bite of abdomen 07/11/2020   Hyperlipidemia 06/22/2020   Type 2 diabetes, diet controlled ([place]) 06/22/2020   Chronic back pain 06/22/2020   Transaminitis 06/22/2020   PUD (peptic ulcer disease) 03/21/2018   Synovial cyst of lumbar facet joint 12/23/2017   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

## 2021-11-13 NOTE — Progress Notes (Signed)
Pt admitted to rm 21 from cath lab. CHG wipe given. Initiated tele. Obtained the VS. Oriented pt to the unit. Call bell within reach.   Lavenia Atlas, RN

## 2021-11-13 NOTE — Op Note (Signed)
[facility]   MULTIDISCIPLINARY HEART VALVE TEAM   TAVR OPERATIVE NOTE   Date of Procedure:  11/13/2021  Preoperative Diagnosis: Severe Aortic Stenosis   Postoperative Diagnosis: Same   Procedure:   Transcatheter Aortic Valve Replacement - Percutaneous right Transfemoral Approach  Edwards Sapien 3 Ultra THV (size 23 mm, model # [id number], serial # [id number])   Co-Surgeons: Coralie Common MD and [name], MD     Anesthesiologist:  Annye Asa MD  Echocardiographer:  Raquel Sarna Senior  Pre-operative Echo Findings: Severe aortic stenosis normal left ventricular systolic function  Post-operative Echo Findings: no paravalvular leak normal left ventricular systolic function   BRIEF CLINICAL NOTE AND INDICATIONS FOR SURGERY   Pt with  NYHA class II symptoms with documented severe AS and normal LV function with no CAD or EKG changes. She has a class I indication for AVR and secondary to her age and comorbidities I feel she is an excellent TAVR candidate with surgical bail out if necessary.    DETAILS OF THE OPERATIVE PROCEDURE  PREPARATION:    The patient was brought to the operating room on the above mentioned date and appropriate monitoring was established by the anesthesia team. The patient was placed in the supine position on the operating table.  Intravenous antibiotics were administered. The patient was monitored closely throughout the procedure under conscious sedation.  Baseline transthoracic echocardiogram was performed. The patient's abdomen and both groins were prepped and draped in a sterile manner. A time out procedure was performed.   PERIPHERAL ACCESS:    Using the modified Seldinger technique, femoral arterial and venous access was obtained with placement of 6 Fr sheaths on the left side.  A pigtail diagnostic catheter was passed through the left arterial sheath under fluoroscopic guidance into the aortic root.  A temporary transvenous  pacemaker catheter was passed through the right femoral venous sheath under fluoroscopic guidance into the right ventricle.  The pacemaker was tested to ensure stable lead placement and pacemaker capture. Aortic root angiography was performed in order to determine the optimal angiographic angle for valve deployment.   TRANSFEMORAL ACCESS:   Percutaneous transfemoral access and sheath placement was performed using ultrasound guidance.  The right common femoral artery was cannulated using a micropuncture needle and appropriate location was verified using hand injection angiogram.  A pair of Abbott Perclose percutaneous closure devices were placed and a 6 French sheath replaced into the femoral artery.  The patient was heparinized systemically and ACT verified > 250 seconds.    A 14 Fr transfemoral E-sheath was introduced into the right common femoral artery after progressively dilating over an Amplatz superstiff wire. An AL2 catheter was used to direct a straight-tip exchange length wire across the native aortic valve into the left ventricle. This was exchanged out for a pigtail catheter and position was confirmed in the LV apex. Simultaneous LV and Ao pressures were recorded. The LVEDP measured 84mHg. The pigtail catheter was exchanged for a Safari wire in the LV apex.   BALLOON AORTIC VALVULOPLASTY:   Balloon aortic valvuloplasty was performed using a 18 mm valvuloplasty balloon.  Once optimal position was achieved, BAV was done under rapid ventricular pacing. The patient recovered well hemodynamically.    TRANSCATHETER HEART VALVE DEPLOYMENT:   An Edwards Sapien 3 Ultra transcatheter heart valve (size 23 mm) was prepared and crimped per manufacturer's guidelines, and the proper orientation of the valve is confirmed on the [employer] Corporationdelivery system. The valve was advanced through the introducer  sheath using normal technique until in an appropriate position in the abdominal aorta beyond the  sheath tip. The balloon was then retracted and using the fine-tuning wheel was centered on the valve. The valve was then advanced across the aortic arch using appropriate flexion of the catheter. The valve was carefully positioned across the aortic valve annulus. The Commander catheter was retracted using normal technique. Once final position of the valve has been confirmed by angiographic assessment, the valve is deployed during rapid ventricular pacing to maintain systolic blood pressure < 50 mmHg and pulse pressure < 10 mmHg. The balloon inflation is held for >3 seconds after reaching full deployment volume. Once the balloon has fully deflated the balloon is retracted into the ascending aorta and valve function is assessed using echocardiography. There is felt to be no paravalvular leak and no central aortic insufficiency.  The patient's hemodynamic recovery following valve deployment is good.  The deployment balloon and guidewire are both removed.    PROCEDURE COMPLETION:   The sheath was removed and femoral artery closure performed.  Protamine was administered once femoral arterial repair was complete. The temporary pacemaker, pigtail catheter and femoral sheaths were removed with manual pressure used for venous hemostasis.  A Mynx femoral closure device was utilized following removal of the diagnostic sheath in the left femoral artery.  The patient tolerated the procedure well and is transported to the cath lab recovery area in stable condition. There were no immediate intraoperative complications. All sponge instrument and needle counts are verified correct at completion of the operation.   No blood products were administered during the operation.     Coralie Common, MD 11/13/2021 9:20 AM

## 2021-11-13 NOTE — Anesthesia Procedure Notes (Signed)
Arterial Line Insertion Start/End9/26/2023 7:00 AM, 11/13/2021 7:10 AM Performed by: Lance Coon, CRNA, CRNA  Patient location: Pre-op. Preanesthetic checklist: patient identified, IV checked, site marked, risks and benefits discussed, surgical consent, monitors and equipment checked, pre-op evaluation, timeout performed and anesthesia consent Lidocaine 1% used for infiltration Left, radial was placed Catheter size: 20 G Hand hygiene performed  and maximum sterile barriers used   Attempts: 2 Procedure performed without using ultrasound guided technique. Following insertion, dressing applied and Biopatch. Post procedure assessment: normal and unchanged  Patient tolerated the procedure well with no immediate complications.

## 2021-11-13 NOTE — Progress Notes (Signed)
Mobility Specialist Progress Note:   11/13/21 1457  Mobility  Activity Ambulated with assistance in hallway  Level of Assistance Contact guard assist, steadying assist  Assistive Device  (IV pole)  Distance Ambulated (ft) 550 ft  Activity Response Tolerated well  $Mobility charge 1 Mobility   Pt received in bed willing to participate in mobility. No complaints of pain. Left in chair with call bell in reach and all needs met. Chair alarm on.   [facility] Surveyor, mining Chat only

## 2021-11-14 ENCOUNTER — Encounter (HOSPITAL_COMMUNITY): Payer: Self-pay | Admitting: Cardiovascular Disease

## 2021-11-14 ENCOUNTER — Other Ambulatory Visit: Payer: Self-pay

## 2021-11-14 ENCOUNTER — Inpatient Hospital Stay (HOSPITAL_COMMUNITY): Payer: Medicare PPO

## 2021-11-14 ENCOUNTER — Other Ambulatory Visit: Payer: Self-pay | Admitting: Physician Assistant

## 2021-11-14 DIAGNOSIS — Z952 Presence of prosthetic heart valve: Secondary | ICD-10-CM | POA: Diagnosis not present

## 2021-11-14 DIAGNOSIS — I350 Nonrheumatic aortic (valve) stenosis: Secondary | ICD-10-CM | POA: Diagnosis not present

## 2021-11-14 DIAGNOSIS — E039 Hypothyroidism, unspecified: Secondary | ICD-10-CM | POA: Diagnosis not present

## 2021-11-14 DIAGNOSIS — D649 Anemia, unspecified: Secondary | ICD-10-CM | POA: Diagnosis not present

## 2021-11-14 DIAGNOSIS — Z006 Encounter for examination for normal comparison and control in clinical research program: Secondary | ICD-10-CM | POA: Diagnosis not present

## 2021-11-14 LAB — ECHOCARDIOGRAM COMPLETE
AR max vel: 2.4 cm2
AV Area VTI: 2.29 cm2
AV Area mean vel: 2.34 cm2
AV Mean grad: 11.3 mmHg
AV Peak grad: 22.9 mmHg
Ao pk vel: 2.39 m/s
Area-P 1/2: 2 cm2
Calc EF: 72 %
Height: 63 in
S' Lateral: 2 cm
Single Plane A2C EF: 74.4 %
Single Plane A4C EF: 68.6 %
Weight: 2259.2 oz

## 2021-11-14 LAB — MAGNESIUM: Magnesium: 1.9 mg/dL (ref 1.7–2.4)

## 2021-11-14 LAB — BASIC METABOLIC PANEL
Anion gap: 8 (ref 5–15)
BUN: 8 mg/dL (ref 8–23)
CO2: 25 mmol/L (ref 22–32)
Calcium: 9.2 mg/dL (ref 8.9–10.3)
Chloride: 104 mmol/L (ref 98–111)
Creatinine, Ser: 0.88 mg/dL (ref 0.44–1.00)
GFR, Estimated: >60 mL/min (ref >60)
Glucose, Bld: 140 mg/dL — ABNORMAL HIGH (ref 70–99)
Potassium: 3.8 mmol/L (ref 3.5–5.1)
Sodium: 137 mmol/L (ref 135–145)

## 2021-11-14 LAB — CBC
HCT: 36.7 % (ref 36.0–46.0)
Hemoglobin: 12.5 g/dL (ref 12.0–15.0)
MCH: 30.1 pg (ref 26.0–34.0)
MCHC: 34.1 g/dL (ref 30.0–36.0)
MCV: 88.4 fL (ref 80.0–100.0)
Platelets: 133 10*3/uL — ABNORMAL LOW (ref 150–400)
RBC: 4.15 MIL/uL (ref 3.87–5.11)
RDW: 13.3 % (ref 11.5–15.5)
WBC: 13.4 10*3/uL — ABNORMAL HIGH (ref 4.0–10.5)
nRBC: 0 % (ref 0.0–0.2)

## 2021-11-14 MED ORDER — ASPIRIN 81 MG PO CHEW: 81.0000 mg | CHEWABLE_TABLET | Freq: Every day | ORAL | Status: AC

## 2021-11-14 MED FILL — Magnesium Sulfate Inj 50%: INTRAMUSCULAR | Qty: 10 | Status: AC

## 2021-11-14 MED FILL — Potassium Chloride Inj 2 mEq/ML: INTRAVENOUS | Qty: 40 | Status: AC

## 2021-11-14 MED FILL — Heparin Sodium (Porcine) Inj 1000 Unit/ML: Qty: 1000 | Status: AC

## 2021-11-14 NOTE — Discharge Summary (Signed)
[place] VALVE TEAM  Discharge Summary    Patient ID: Cheryl Cooper MRN: [id number]; DOB: 09/08/1940  Admit date: 11/13/2021 Discharge date: 11/14/2021  Primary Care Provider: Libby Maw, MD  Primary Cardiologist: Evalina Field, MD / Dr. Burt Knack & Dr. Lavonna Monarch (TAVR)  Discharge Diagnoses    Principal Problem:   S/P TAVR (transcatheter aortic valve replacement) Active Problems:   PUD (peptic ulcer disease)   Hyperlipidemia   Type 2 diabetes, diet controlled (HCC)   Chronic back pain   Anemia   Depression   Severe aortic stenosis   Allergies Allergies  Allergen Reactions   Other Other (See Comments) and Rash    Tomato sauce, garlic, onion - severe acid reflux    Flexeril [Cyclobenzaprine] Other (See Comments)    Per spouse "she felt like a zombie"   Chlordiazepoxide-Clidinium Other (See Comments)    Dizziness (intolerance)   Codeine Nausea Only and Rash   Lipitor [Atorvastatin] Other (See Comments)    Unbalanced    Diagnostic Studies/Procedures    TAVR OPERATIVE NOTE     Date of Procedure:                11/13/2021   Preoperative Diagnosis:      Severe Aortic Stenosis    Postoperative Diagnosis:    Same    Procedure:        Transcatheter Aortic Valve Replacement - Percutaneous  Transfemoral Approach             Edwards Sapien 3 Ultra Resilia THV (size 23 mm, model number [id number], serial # [id number])              Co-Surgeons:                        Coralie Common, MD and [name], MD   Anesthesiologist:                  Annye Asa, MD   Echocardiographer:              Rudean Haskell, MD   Pre-operative Echo Findings: Severe bicuspid aortic stenosis Normal/vigorous left ventricular systolic function   Post-operative Echo Findings: No paravalvular leak Unchanged left ventricular systolic function _____________    Echo 11/14/21: completed but pending formal read at the time of  discharge   History of Present Illness     Cheryl Cooper is a 81 y.o. female with a history of HTN, HLD, GERD, hypothyroidism, diet controlled DMT2 and severe AS who presented to [facility] on 11/13/21 for planned TAVR.   She has been followed for aortic stenosis for the past 18 months.  Her most recent echo 10/08/21 showed EF 70%, with very severe AS with a mean grad 69 mmHg, peak grad 93.7 mmHg, AVA 0.46 cm2, DVI 0.18. She reported progressive symptoms of exertional dyspnea as well as fatigue and intermittent dizziness. Additionally, she will require back surgery and it was recommended that her aortic stenosis be addressed first. She was referred to Dr. Burt Knack for evaluation. [facility] 10/24/21 showed patent coronary arteries (right dominant) with a moderate ostial diagonal stenosis and no other significant coronary artery disease. Normal right heart pressures and cardiac output.  The patient has been evaluated by the multidisciplinary valve team and felt to have severe, symptomatic aortic stenosis and to be a suitable candidate for TAVR, which was set up for 11/13/21.  Hospital Course  Consultants: none   Severe AS: s/p successful TAVR with a 23 mm Edwards Sapien 3 Ultra Resilia THV via the TF approach on 11/13/21. Post operative echo completed but pending formal read. Groin sites are stable. ECG with new first degree AV block but narrow QRS. There has been no evidence of HAVB. Started on a baby Asprin 81 mg daily. Plan for discharge home today with close follow up in the office next week.   HTN: BP well controlled. Resume home meds  DMT2: diet controlled.   HLD: resume statin and ezetimibe.   _____________  Discharge Vitals Blood pressure (!) 147/71, pulse 62, temperature (!) 97.5 F (36.4 C), temperature source Oral, resp. rate 17, height 5' 3"  (1.6 m), weight 64 kg, SpO2 94 %.  Filed Weights   11/13/21 0544 11/14/21 0340  Weight: 62.6 kg 64 kg     GEN: Well nourished, well developed, in no  acute distress HEENT: normal Neck: no JVD or masses Cardiac: RRR; no murmurs, rubs, or gallops,no edema  Respiratory:  clear to auscultation bilaterally, normal work of breathing GI: soft, nontender, nondistended, + BS MS: no deformity or atrophy Skin: warm and dry, no rash  Groin sites clear without hematoma or ecchymosis  Neuro:  Alert and Oriented x 3, Strength and sensation are intact Psych: euthymic mood, full affect   Labs & Radiologic Studies    CBC Recent Labs    11/13/21 0937 11/14/21 0217  WBC  --  13.4*  HGB 12.2 12.5  HCT 36.0 36.7  MCV  --  88.4  PLT  --  071*   Basic Metabolic Panel Recent Labs    11/13/21 0937 11/14/21 0217  NA 139 137  K 4.1 3.8  CL 102 104  CO2  --  25  GLUCOSE 153* 140*  BUN 9 8  CREATININE 0.70 0.88  CALCIUM  --  9.2  MG  --  1.9   Liver Function Tests No results for input(s): "AST", "ALT", "ALKPHOS", "BILITOT", "PROT", "ALBUMIN" in the last 72 hours. No results for input(s): "LIPASE", "AMYLASE" in the last 72 hours. Cardiac Enzymes No results for input(s): "CKTOTAL", "CKMB", "CKMBINDEX", "TROPONINI" in the last 72 hours. BNP Invalid input(s): "POCBNP" D-Dimer No results for input(s): "DDIMER" in the last 72 hours. Hemoglobin A1C No results for input(s): "HGBA1C" in the last 72 hours. Fasting Lipid Panel No results for input(s): "CHOL", "HDL", "LDLCALC", "TRIG", "CHOLHDL", "LDLDIRECT" in the last 72 hours. Thyroid Function Tests No results for input(s): "TSH", "T4TOTAL", "T3FREE", "THYROIDAB" in the last 72 hours.  Invalid input(s): "FREET3" _____________  ECHOCARDIOGRAM LIMITED  Result Date: 11/13/2021    ECHOCARDIOGRAM LIMITED REPORT   Patient Name:   Cheryl Cooper Date of Exam: 11/13/2021 Medical Rec #:  [id number]       Height:       63.0 in Accession #:    [id number]      Weight:       138.0 lb Date of Birth:  1940/12/18        BSA:          1.652 m Patient Age:    11 years        BP:           162/83 mmHg Patient  Gender: F               HR:           61 bpm. Exam Location:  Inpatient Procedure: Limited Echo, Color Doppler and  Cardiac Doppler Indications:     Aortic Stenosis i35.0  History:         Patient has prior history of Echocardiogram examinations, most                  recent 10/08/2021. Risk Factors:Hypertension, Diabetes and                  Dyslipidemia.  Sonographer:     Raquel Sarna Senior RDCS Referring Phys:  [place] Diagnosing Phys: Rudean Haskell MD  Sonographer Comments: 74m Edwards S3U TAVR Implanted IMPRESSIONS  1. Interventional TTE for TAVR Placement.  2. Prior to procedure, Forme Fruste Bicuspid with R-L fusion. Severe aortic stenosis. Mean gradient 29 mm Hg, peak gradient 54 mm Hg, DVI 0.23, and AVA 0.72 cm2. No AI.  3. Post procedure a 23 mm Sapien Valve placed. Mean gradient 4 mm Hg, Peak gradient 8 mm Hg. Normal DVI, EOA 2.78 cm2. Normal prosthetic function.  4. Left ventricular ejection fraction, by estimation, is 65 to 70%. The left ventricle has normal function.  5. Right ventricular systolic function is normal. The right ventricular size is normal.  6. The mitral valve is abnormal. No evidence of mitral valve regurgitation. FINDINGS  Left Ventricle: Left ventricular ejection fraction, by estimation, is 65 to 70%. The left ventricle has normal function. Right Ventricle: The right ventricular size is normal. Right ventricular systolic function is normal. Mitral Valve: The mitral valve is abnormal. Aortic Valve: Aortic valve mean gradient measures 4.0 mmHg. Aortic valve peak gradient measures 8.2 mmHg. Aortic valve area, by VTI measures 2.77 cm. LEFT VENTRICLE PLAX 2D LVOT diam:     1.90 cm LV SV:         85 LV SV Index:   52 LVOT Area:     2.84 cm  AORTIC VALVE AV Area (Vmax):    2.32 cm AV Area (Vmean):   2.20 cm AV Area (VTI):     2.77 cm AV Vmax:           143.00 cm/s AV Vmean:          97.700 cm/s AV VTI:            0.307 m AV Peak Grad:      8.2 mmHg AV Mean Grad:      4.0  mmHg LVOT Vmax:         117.00 cm/s LVOT Vmean:        75.800 cm/s LVOT VTI:          0.300 m LVOT/AV VTI ratio: 0.98  SHUNTS Systemic VTI:  0.30 m Systemic Diam: 1.90 cm MRudean HaskellMD Electronically signed by MRudean HaskellMD Signature Date/Time: 11/13/2021/5:21:08 PM    Final    Structural Heart Procedure  Result Date: 11/13/2021 See surgical note for result.  DG Chest 2 View  Result Date: 11/12/2021 CLINICAL DATA:  Severe aortic stenosis.  Preop exam. EXAM: CHEST - 2 VIEW COMPARISON:  Chest x-ray dated 11/28/2018 FINDINGS: Heart size and mediastinal contours are within normal limits. Atherosclerosis is seen at the aortic arch. Lungs are clear. No pleural effusion is seen. No acute-appearing osseous abnormality. Bilateral shoulder arthroplasty hardware in place. IMPRESSION: 1. No active cardiopulmonary disease.  Lungs are clear. 2. Atherosclerosis at the aortic arch. Electronically Signed   By: SFranki CabotM.D.   On: 11/12/2021 09:43   CT CORONARY MORPH W/CTA COR W/SCORE W/CA W/CM &/OR WO/CM  Addendum Date: 11/06/2021   ADDENDUM REPORT: 11/06/2021  14:26 ADDENDUM: CLINICAL DATA: 81 year old female with a history of aortic valve disease referred for TAVR evaluation EXAM: CT ANGIOGRAPHY CHEST, ABDOMEN AND PELVIS TECHNIQUE: Multidetector CT imaging through the chest, abdomen and pelvis was performed using the standard protocol during bolus administration of intravenous contrast. Multiplanar reconstructed images and MIPs were obtained and reviewed to evaluate the vascular anatomy. RADIATION DOSE REDUCTION: This exam was performed according to the departmental dose-optimization program which includes automated exposure control, adjustment of the mA and/or kV according to patient size and/or use of iterative reconstruction technique. CONTRAST: 28m OMNIPAQUE IOHEXOL 350 MG/ML SOLN COMPARISON: 08/10/2020, 03/26/2020 FINDINGS: CTA CHEST FINDINGS Cardiovascular: Heart: No cardiomegaly. No  pericardial fluid/thickening. Calcifications of left main, left anterior descending, circumflex coronary arteries. Aorta: Dense calcifications of the aortic valve. Greatest estimated diameter of the aortic annulus 26 mm on the coronal reformatted images. Greatest estimated diameter at the sinuses of Valsalva on the coronal images, 29 mm. Greatest estimated diameter of the sino-tubular junction, 25 mm on the coronal images. Greatest estimated diameter of the ascending aorta on the axial images, 30 mm. Mild atherosclerotic changes of the aorta. Branch vessels are patent with independent origin innominate artery, left common carotid artery, left vertebral artery, left subclavian artery. Cervical cerebral vessels patent at the base of the neck. Incidental imaging of the distal common carotid arteries demonstrating calcified atherosclerosis. No pedunculated plaque, ulcerated plaque, dissection, periaortic fluid. No wall thickening. Pulmonary arteries: Timing of the contrast bolus is not optimized for evaluation of pulmonary artery filling defects. Unremarkable size of the main pulmonary artery. Mediastinum/Nodes: No mediastinal adenopathy. Unremarkable appearance of the thoracic esophagus. Unremarkable appearance of the thoracic inlet. Lungs/Pleura: Central airways are clear. No pleural effusion. No confluent airspace disease. No pneumothorax. CTA ABDOMEN AND PELVIS FINDINGS VASCULAR Aorta: Moderate atherosclerotic plaque of the abdominal aorta. No dissection, pedunculated plaque, ulcerated plaque. Mild irregular soft plaque in the distal abdominal aorta, below the IMA origin results in no significant narrowing of the flow channel, estimated greater than 11 mm. No periaortic fluid or inflammatory changes. Celiac: The celiac artery patent with no significant atherosclerotic changes. Celiac artery contributes to common hepatic artery/proper hepatic artery only. There is a separate origin of the splenic artery, which  contributes to the left gastric artery. SMA: Moderate atherosclerotic changes of the SMA origin, estimated 50% narrowed. Renals: - Right: Single right renal artery. Calcified atherosclerotic plaque at the origin with less than 50% narrowing. - Left: There are 2 left renal arteries. The dominant more superior demonstrates atherosclerotic calcifications at the origin, with estimated 50% narrowing. The more inferior is patent, small caliber. IMA: Inferior mesenteric artery is patent. Right lower extremity: Mild atherosclerotic plaque of the right iliac system with mild tortuosity. Native diameter right common iliac artery: 8-9 mm Narrowing at the site of calcified plaque, proximal right common iliac artery: 6-7 mm Hypogastric artery is patent and pelvic arteries remain patent. Native diameter right external iliac artery: 7 mm. No significant atherosclerotic plaque of the external iliac artery. Minimal atherosclerotic plaque of the right common femoral artery, with calcifications on the posterior wall. Native diameter of the right common femoral artery estimated 7 mm Proximal profunda femoris and SFA patent. Left lower extremity: Mild atherosclerotic changes of the left iliac system without high-grade stenosis. Native diameter left common iliac artery: 8-9 mm. Hypogastric artery patent. Pelvic arteries patent. Native diameter of the external iliac artery 6-7 mm. Mild to moderate atherosclerotic changes of the left common femoral artery. Anterior calcified plaque just above the  bifurcation extending into the SFA. Proximal profunda femoris and SFA patent. Native diameter of the left common femoral artery is estimated 8 mm. Veins: Unremarkable appearance of the portal system. Unremarkable appearance of the IVC. Unremarkable bilateral renal veins. Unremarkable iliac veins. Unremarkable proximal right femoral veins. Calcifications of the left saphenofemoral junction. Review of the MIP images confirms the above findings.  NON-VASCULAR Hepatobiliary: Cirrhotic morphology of the liver again demonstrated with enlarged caudate lobe and some mild nodularity on the surface of the liver. No focal lesion. Cholecystectomy Pancreas: Unremarkable. Spleen: Unremarkable. Adrenals/Urinary Tract: - Right adrenal gland: Unremarkable - Left adrenal gland: Unremarkable. - Right kidney: No hydronephrosis, nephrolithiasis, inflammation, or ureteral dilation. No focal lesion. - Left Kidney: No hydronephrosis, nephrolithiasis, inflammation, or ureteral dilation. No focal lesion. - Urinary Bladder: Urinary bladder decompressed, unremarkable. Stomach/Bowel: - Stomach: Hiatal hernia. - Small bowel: Unremarkable - Appendix: Normal. - Colon: Moderate stool burden. Advanced left-sided diverticular disease. No acute inflammatory changes. Lymphatic: No adenopathy. Mesenteric: No free fluid or air. No mesenteric adenopathy. Reproductive: Hysterectomy Other: No hernia. Musculoskeletal: Multilevel degenerative changes of the visualized cervical, thoracic, lumbar spine. No bony canal narrowing. No acute displaced fracture. Surgical changes of the bilateral shoulders. Surgical changes of right hip. No aggressive lytic or sclerotic lesions. IMPRESSION: Dense calcifications of the aortic valve, with associated coronary artery disease. Mild atherosclerosis of the thoracic and abdominal aorta, without dissection, pedunculated plaque, ulcerated plaque, aneurysm, or narrowing. Aortic Atherosclerosis (ICD10-I70.0). Bilateral CIA are estimated 8-9 mm in native diameter with minimal atherosclerosis on the right, which is estimated to focally narrow the right CIA lumen to 6-7 mm. Bilateral EIA estimated 7-8 mm in native diameter without significant atherosclerotic changes. Mild bilateral common femoral artery atherosclerosis. 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 Cirrhotic morphology of the liver. Diverticular disease without evidence of acute  diverticulitis. Additional ancillary findings as above. Signed, Dulcy Fanny. Nadene Rubins, RPVI [facility] Radiology Electronically Signed By: Corrie Mckusick D.O. On: 11/02/2021 11:57 Electronically Signed   By: Corrie Mckusick D.O.   On: 11/06/2021 14:26   Result Date: 11/06/2021 CLINICAL DATA:  Severe Aortic Stenosis. EXAM: Cardiac TAVR CT TECHNIQUE: A non-contrast, gated CT scan was obtained with axial slices of 3 mm through the heart for aortic valve calcium scoring. A 90 kV retrospective, gated, contrast cardiac scan was obtained. Gantry rotation speed was 250 msecs and collimation was 0.6 mm. Nitroglycerin was not given. The 3D data set was reconstructed in 5% intervals of the 0-95% of the R-R cycle. Systolic and diastolic phases were analyzed on a dedicated workstation using MPR, MIP, and VRT modes. The patient received 100 cc of contrast. FINDINGS: Image quality: Excellent. Noise artifact is: Limited. Valve Morphology: Bicuspid aortic valve with fusion of the RCC/LCC with raphe (Sievers type 1). Severe calcifications of the leaflets with severely restricted movement in systole. Aortic Valve Calcium score: 1808 Aortic annular dimension: Phase assessed: 15% Annular area: 401 mm2 Annular perimeter: 73.4 mm Max diameter: 26.6 mm Min diameter: 19.7 mm Annular and subannular calcification: Minimal calcification under the RCC. Membranous septum length: 5.0 mm Optimal coplanar projection: RAO 5 CAU 7 Coronary Artery Height above Annulus: Left Main: 11.6 mm Right Coronary: 16.0 mm Sinus of Valsalva Measurements: Non-coronary: 28.5 mm Right-coronary: 27.0 mm Left-coronary: 28.0 mm Sinus of Valsalva Height: Non-coronary: 20.4 mm Right-coronary: 19.6 mm Left-coronary: 17.8 mm Sinotubular Junction: 26 mm Ascending Thoracic Aorta: 30 mm Coronary Arteries: Normal coronary origin. Right dominance. The study was  performed without use of NTG and is insufficient for plaque  evaluation. Please refer to recent cardiac catheterization for coronary assessment. Coronary calcifications noted in the LAD/LCX. Cardiac Morphology: Right Atrium: Right atrial size is within normal limits. Right Ventricle: The right ventricular cavity is within normal limits. Left Atrium: Left atrial size is normal in size with no left atrial appendage filling defect. Left Ventricle: The ventricular cavity size is within normal limits. Pulmonary arteries: Normal in size without proximal filling defect. Pulmonary veins: Normal pulmonary venous drainage. Pericardium: Normal thickness with no significant effusion or calcium present. Mitral Valve: The mitral valve is degenerative with moderate annular calcium. Extra-cardiac findings: See attached radiology report for non-cardiac structures. IMPRESSION: 1. Bicuspid aortic valve with fusion of the RCC/LCC with raphe (Sievers type 1). 2. Annular measurements support a 23 mm S3 (401 mm2). Would recommend against 29 mm Evlot as sinuses do not support this device. 3. No significant annular or subannular calcifications. 4. Sufficient coronary to annulus distance. 5. Optimal Fluoroscopic Angle for Delivery: RAO 5 CAU [place]. Audie Box, MD Electronically Signed: By: Eleonore Chiquito M.D. On: 11/02/2021 10:53   CT ANGIO ABDOMEN PELVIS  W &/OR WO CONTRAST  Result Date: 11/02/2021 CLINICAL DATA:  81 year old female with a history of aortic valve disease referred for TAVR evaluation EXAM: CT ANGIOGRAPHY CHEST, ABDOMEN AND PELVIS TECHNIQUE: Multidetector CT imaging through the chest, abdomen and pelvis was performed using the standard protocol during bolus administration of intravenous contrast. Multiplanar reconstructed images and MIPs were obtained and reviewed to evaluate the vascular anatomy. RADIATION DOSE REDUCTION: This exam was performed according to the departmental dose-optimization program which includes automated exposure control, adjustment of the mA and/or kV  according to patient size and/or use of iterative reconstruction technique. CONTRAST:  27m OMNIPAQUE IOHEXOL 350 MG/ML SOLN COMPARISON:  08/10/2020, 03/26/2020 FINDINGS: CTA CHEST FINDINGS Cardiovascular: Heart: No cardiomegaly. No pericardial fluid/thickening. Calcifications of left main, left anterior descending, circumflex coronary arteries. Aorta: Dense calcifications of the aortic valve. Greatest estimated diameter of the aortic annulus 26 mm on the coronal reformatted images. Greatest estimated diameter at the sinuses of Valsalva on the coronal images, 29 mm. Greatest estimated diameter of the sino-tubular junction, 25 mm on the coronal images. Greatest estimated diameter of the ascending aorta on the axial images, 30 mm. Mild atherosclerotic changes of the aorta. Branch vessels are patent with independent origin innominate artery, left common carotid artery, left vertebral artery, left subclavian artery. Cervical cerebral vessels patent at the base of the neck. Incidental imaging of the distal common carotid arteries demonstrating calcified atherosclerosis. No pedunculated plaque, ulcerated plaque, dissection, periaortic fluid. No wall thickening. Pulmonary arteries: Timing of the contrast bolus is not optimized for evaluation of pulmonary artery filling defects. Unremarkable size of the main pulmonary artery. Mediastinum/Nodes: No mediastinal adenopathy. Unremarkable appearance of the thoracic esophagus. Unremarkable appearance of the thoracic inlet. Lungs/Pleura: Central airways are clear. No pleural effusion. No confluent airspace disease. No pneumothorax. CTA ABDOMEN AND PELVIS FINDINGS VASCULAR Aorta: Moderate atherosclerotic plaque of the abdominal aorta. No dissection, pedunculated plaque, ulcerated plaque. Mild irregular soft plaque in the distal abdominal aorta, below the IMA origin results in no significant narrowing of the flow channel, estimated greater than 11 mm. No periaortic fluid or  inflammatory changes. Celiac: The celiac artery patent with no significant atherosclerotic changes. Celiac artery contributes to common hepatic artery/proper hepatic artery only. There is a separate origin of the splenic artery, which contributes to the left gastric artery. SMA: Moderate  atherosclerotic changes of the SMA origin, estimated 50% narrowed. Renals: - Right: Single right renal artery. Calcified atherosclerotic plaque at the origin with less than 50% narrowing. - Left: There are 2 left renal arteries. The dominant more superior demonstrates atherosclerotic calcifications at the origin, with estimated 50% narrowing. The more inferior is patent, small caliber. IMA: Inferior mesenteric artery is patent. Right lower extremity: Mild atherosclerotic plaque of the right iliac system with mild tortuosity. Native diameter right common iliac artery: 8-9 mm Narrowing at the site of calcified plaque, proximal right common iliac artery: 6-7 mm Hypogastric artery is patent and pelvic arteries remain patent. Native diameter right external iliac artery: 7 mm. No significant atherosclerotic plaque of the external iliac artery. Minimal atherosclerotic plaque of the right common femoral artery, with calcifications on the posterior wall. Native diameter of the right common femoral artery estimated 7 mm Proximal profunda femoris and SFA patent. Left lower extremity: Mild atherosclerotic changes of the left iliac system without high-grade stenosis. Native diameter left common iliac artery: 8-9 mm. Hypogastric artery patent.  Pelvic arteries patent. Native diameter of the external iliac artery 6-7 mm. Mild to moderate atherosclerotic changes of the left common femoral artery. Anterior calcified plaque just above the bifurcation extending into the SFA. Proximal profunda femoris and SFA patent. Native diameter of the left common femoral artery is estimated 8 mm. Veins: Unremarkable appearance of the portal system. Unremarkable  appearance of the IVC. Unremarkable bilateral renal veins. Unremarkable iliac veins. Unremarkable proximal right femoral veins. Calcifications of the left saphenofemoral junction. Review of the MIP images confirms the above findings. NON-VASCULAR Hepatobiliary: Cirrhotic morphology of the liver again demonstrated with enlarged caudate lobe and some mild nodularity on the surface of the liver. No focal lesion. Cholecystectomy Pancreas: Unremarkable. Spleen: Unremarkable. Adrenals/Urinary Tract: - Right adrenal gland: Unremarkable - Left adrenal gland: Unremarkable. - Right kidney: No hydronephrosis, nephrolithiasis, inflammation, or ureteral dilation. No focal lesion. - Left Kidney: No hydronephrosis, nephrolithiasis, inflammation, or ureteral dilation. No focal lesion. - Urinary Bladder: Urinary bladder decompressed, unremarkable. Stomach/Bowel: - Stomach: Hiatal hernia. - Small bowel: Unremarkable - Appendix: Normal. - Colon: Moderate stool burden. Advanced left-sided diverticular disease. No acute inflammatory changes. Lymphatic: No adenopathy. Mesenteric: No free fluid or air. No mesenteric adenopathy. Reproductive: Hysterectomy Other: No hernia. Musculoskeletal: Multilevel degenerative changes of the visualized cervical, thoracic, lumbar spine. No bony canal narrowing. No acute displaced fracture. Surgical changes of the bilateral shoulders. Surgical changes of right hip. No aggressive lytic or sclerotic lesions. IMPRESSION: Dense calcifications of the aortic valve, with associated coronary artery disease. Mild atherosclerosis of the thoracic and abdominal aorta, without dissection, pedunculated plaque, ulcerated plaque, aneurysm, or narrowing. Aortic Atherosclerosis (ICD10-I70.0). Bilateral CIA are estimated 8-9 mm in native diameter with minimal atherosclerosis on the right, which is estimated to focally narrow the right CIA lumen to 6-7 mm. Bilateral EIA estimated 7-8 mm in native diameter without significant  atherosclerotic changes. Mild bilateral common femoral artery atherosclerosis. 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 Cirrhotic morphology of the liver. Diverticular disease without evidence of acute diverticulitis. Additional ancillary findings as above. Signed, Dulcy Fanny. Nadene Rubins, RPVI [facility] Radiology Electronically Signed   By: Corrie Mckusick D.O.   On: 11/02/2021 11:57   CT ANGIO CHEST AORTA W/CM & OR WO/CM  Result Date: 11/02/2021 CLINICAL DATA:  81 year old female with a history of aortic valve disease referred for TAVR evaluation EXAM: CT ANGIOGRAPHY CHEST, ABDOMEN AND PELVIS TECHNIQUE: Multidetector  CT imaging through the chest, abdomen and pelvis was performed using the standard protocol during bolus administration of intravenous contrast. Multiplanar reconstructed images and MIPs were obtained and reviewed to evaluate the vascular anatomy. RADIATION DOSE REDUCTION: This exam was performed according to the departmental dose-optimization program which includes automated exposure control, adjustment of the mA and/or kV according to patient size and/or use of iterative reconstruction technique. CONTRAST:  27m OMNIPAQUE IOHEXOL 350 MG/ML SOLN COMPARISON:  08/10/2020, 03/26/2020 FINDINGS: CTA CHEST FINDINGS Cardiovascular: Heart: No cardiomegaly. No pericardial fluid/thickening. Calcifications of left main, left anterior descending, circumflex coronary arteries. Aorta: Dense calcifications of the aortic valve. Greatest estimated diameter of the aortic annulus 26 mm on the coronal reformatted images. Greatest estimated diameter at the sinuses of Valsalva on the coronal images, 29 mm. Greatest estimated diameter of the sino-tubular junction, 25 mm on the coronal images. Greatest estimated diameter of the ascending aorta on the axial images, 30 mm. Mild atherosclerotic changes of the aorta. Branch vessels are patent with  independent origin innominate artery, left common carotid artery, left vertebral artery, left subclavian artery. Cervical cerebral vessels patent at the base of the neck. Incidental imaging of the distal common carotid arteries demonstrating calcified atherosclerosis. No pedunculated plaque, ulcerated plaque, dissection, periaortic fluid. No wall thickening. Pulmonary arteries: Timing of the contrast bolus is not optimized for evaluation of pulmonary artery filling defects. Unremarkable size of the main pulmonary artery. Mediastinum/Nodes: No mediastinal adenopathy. Unremarkable appearance of the thoracic esophagus. Unremarkable appearance of the thoracic inlet. Lungs/Pleura: Central airways are clear. No pleural effusion. No confluent airspace disease. No pneumothorax. CTA ABDOMEN AND PELVIS FINDINGS VASCULAR Aorta: Moderate atherosclerotic plaque of the abdominal aorta. No dissection, pedunculated plaque, ulcerated plaque. Mild irregular soft plaque in the distal abdominal aorta, below the IMA origin results in no significant narrowing of the flow channel, estimated greater than 11 mm. No periaortic fluid or inflammatory changes. Celiac: The celiac artery patent with no significant atherosclerotic changes. Celiac artery contributes to common hepatic artery/proper hepatic artery only. There is a separate origin of the splenic artery, which contributes to the left gastric artery. SMA: Moderate atherosclerotic changes of the SMA origin, estimated 50% narrowed. Renals: - Right: Single right renal artery. Calcified atherosclerotic plaque at the origin with less than 50% narrowing. - Left: There are 2 left renal arteries. The dominant more superior demonstrates atherosclerotic calcifications at the origin, with estimated 50% narrowing. The more inferior is patent, small caliber. IMA: Inferior mesenteric artery is patent. Right lower extremity: Mild atherosclerotic plaque of the right iliac system with mild tortuosity.  Native diameter right common iliac artery: 8-9 mm Narrowing at the site of calcified plaque, proximal right common iliac artery: 6-7 mm Hypogastric artery is patent and pelvic arteries remain patent. Native diameter right external iliac artery: 7 mm. No significant atherosclerotic plaque of the external iliac artery. Minimal atherosclerotic plaque of the right common femoral artery, with calcifications on the posterior wall. Native diameter of the right common femoral artery estimated 7 mm Proximal profunda femoris and SFA patent. Left lower extremity: Mild atherosclerotic changes of the left iliac system without high-grade stenosis. Native diameter left common iliac artery: 8-9 mm. Hypogastric artery patent.  Pelvic arteries patent. Native diameter of the external iliac artery 6-7 mm. Mild to moderate atherosclerotic changes of the left common femoral artery. Anterior calcified plaque just above the bifurcation extending into the SFA. Proximal profunda femoris and SFA patent. Native diameter of the left common femoral artery is estimated 8 mm.  Veins: Unremarkable appearance of the portal system. Unremarkable appearance of the IVC. Unremarkable bilateral renal veins. Unremarkable iliac veins. Unremarkable proximal right femoral veins. Calcifications of the left saphenofemoral junction. Review of the MIP images confirms the above findings. NON-VASCULAR Hepatobiliary: Cirrhotic morphology of the liver again demonstrated with enlarged caudate lobe and some mild nodularity on the surface of the liver. No focal lesion. Cholecystectomy Pancreas: Unremarkable. Spleen: Unremarkable. Adrenals/Urinary Tract: - Right adrenal gland: Unremarkable - Left adrenal gland: Unremarkable. - Right kidney: No hydronephrosis, nephrolithiasis, inflammation, or ureteral dilation. No focal lesion. - Left Kidney: No hydronephrosis, nephrolithiasis, inflammation, or ureteral dilation. No focal lesion. - Urinary Bladder: Urinary bladder  decompressed, unremarkable. Stomach/Bowel: - Stomach: Hiatal hernia. - Small bowel: Unremarkable - Appendix: Normal. - Colon: Moderate stool burden. Advanced left-sided diverticular disease. No acute inflammatory changes. Lymphatic: No adenopathy. Mesenteric: No free fluid or air. No mesenteric adenopathy. Reproductive: Hysterectomy Other: No hernia. Musculoskeletal: Multilevel degenerative changes of the visualized cervical, thoracic, lumbar spine. No bony canal narrowing. No acute displaced fracture. Surgical changes of the bilateral shoulders. Surgical changes of right hip. No aggressive lytic or sclerotic lesions. IMPRESSION: Dense calcifications of the aortic valve, with associated coronary artery disease. Mild atherosclerosis of the thoracic and abdominal aorta, without dissection, pedunculated plaque, ulcerated plaque, aneurysm, or narrowing. Aortic Atherosclerosis (ICD10-I70.0). Bilateral CIA are estimated 8-9 mm in native diameter with minimal atherosclerosis on the right, which is estimated to focally narrow the right CIA lumen to 6-7 mm. Bilateral EIA estimated 7-8 mm in native diameter without significant atherosclerotic changes. Mild bilateral common femoral artery atherosclerosis. 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 Cirrhotic morphology of the liver. Diverticular disease without evidence of acute diverticulitis. Additional ancillary findings as above. Signed, Dulcy Fanny. Nadene Rubins, RPVI [facility] Radiology Electronically Signed   By: Corrie Mckusick D.O.   On: 11/02/2021 11:57   CARDIAC CATHETERIZATION  Result Date: 10/24/2021   1st Diag lesion is 60% stenosed. 1.  Patent coronary arteries (right dominant) with a moderate ostial diagonal stenosis and no other significant coronary artery disease 2.  Normal right heart pressures and cardiac output 3.  Severe calcification and restriction of the aortic valve leaflets seen  on plain fluoroscopy with known severe aortic stenosis.  Severe calcification also identified in the aortic root Recommendations: Continue evaluation for TAVR.  Patient will undergo CTA studies and formal cardiac surgical consultation as part of a multidisciplinary approach to her care.    Disposition   Pt is being discharged home today in good condition.  Follow-up Plans & Appointments     Follow-up Information     Eileen Stanford, PA-C. Go on 11/21/2021.   Specialties: Cardiology, Radiology Why: @ 1:30pm, please arrive at least 10 min early. Contact information: [place] [id number] [phone]                Discharge Instructions     Amb Referral to Cardiac Rehabilitation   Complete by: As directed    Diagnosis: Valve Replacement   Valve: Aortic   After initial evaluation and assessments completed: Virtual Based Care may be provided alone or in conjunction with Phase 2 Cardiac Rehab based on patient barriers.: Yes   Intensive Cardiac Rehabilitation (ICR) [place] location only OR Traditional Cardiac Rehabilitation (TCR) *If criteria for ICR are not met will enroll in TCR [facility] only): Yes       Discharge Medications   Allergies as  of 11/14/2021       Reactions   Other Other (See Comments), Rash   Tomato sauce, garlic, onion - severe acid reflux    Flexeril [cyclobenzaprine] Other (See Comments)   Per spouse "she felt like a zombie"   Chlordiazepoxide-clidinium Other (See Comments)   Dizziness (intolerance)   Codeine Nausea Only, Rash   Lipitor [atorvastatin] Other (See Comments)   Unbalanced        Medication List     TAKE these medications    amLODipine 5 MG tablet Commonly known as: NORVASC TAKE 1 AND 1/2 TABLET BY MOUTH DAILY   aspirin 81 MG chewable tablet Chew 1 tablet (81 mg total) by mouth daily.   B-12 PO Take 1 tablet by mouth daily.   CALCIUM-D PO Take 2 tablets by mouth daily. CHEWABLES    Cholecalciferol 50 MCG (2000 UT) Tabs Take 2,000 Units by mouth in the morning.   DIGESTIVE HEALTH PROBIOTIC PO Take 2 capsules by mouth in the morning.   donepezil 5 MG tablet Commonly known as: ARICEPT Take 1 tablet (5 mg total) by mouth at bedtime.   escitalopram 20 MG tablet Commonly known as: LEXAPRO Take 1 tablet (20 mg total) by mouth daily.   esomeprazole 40 MG capsule Commonly known as: NEXIUM Take 1 capsule (40 mg total) by mouth 2 (two) times daily before a meal.   ezetimibe 10 MG tablet Commonly known as: ZETIA TAKE ONE TABLET BY MOUTH DAILY   ferrous sulfate 325 (65 FE) MG tablet Take 325 mg by mouth daily with breakfast.   FIBER ADULT GUMMIES PO Take 2 tablets by mouth daily.   GINKGO BILOBA PO Take 1 capsule by mouth in the morning.   glucose blood test strip daily.   levothyroxine 75 MCG tablet Commonly known as: SYNTHROID Take 1 tablet (75 mcg total) by mouth daily with breakfast.   oxybutynin 15 MG 24 hr tablet Commonly known as: DITROPAN XL TAKE ONE TABLET BY MOUTH AT BEDTIME   rosuvastatin 10 MG tablet Commonly known as: CRESTOR TAKE ONE TABLET BY MOUTH DAILY   traMADol 50 MG tablet Commonly known as: ULTRAM Take 1 tablet (50 mg total) by mouth every 6 (six) hours as needed.   traZODone 50 MG tablet Commonly known as: DESYREL Take 0.5 tablets (25 mg total) by mouth at bedtime.   WOMENS MULTI GUMMIES PO Take 2 tablets by mouth daily.        Outstanding Labs/Studies   none  Duration of Discharge Encounter   Greater than 30 minutes including physician time.  Mable Fill, PA-C 11/14/2021, 8:55 AM [phone]  Patient seen, examined. Available data reviewed. Agree with findings, assessment, and plan as outlined by Nell Range, PA-C.  The patient is independently interviewed and examined this morning.  She is alert, oriented, in no distress.  HEENT is normal, lungs are clear bilaterally, heart is regular rate and  rhythm with a 2/6 systolic ejection murmur at the right upper sternal border, abdomen soft nontender, bilateral groin sites are clear with no hematoma or ecchymosis, lower extremities have no edema.  Telemetry is reviewed and shows normal sinus rhythm with occasional PVCs and few short supraventricular runs.  The patient's 2D echo is reviewed and shows normal LV systolic function with normal function of her TAVR bioprosthesis.  The formal report is pending but on my review there is no paravalvular regurgitation and the mean gradient is 11 to 12 mmHg.  This demonstrates normal function of her bioprosthesis.  The patient is medically stable for hospital discharge.  I agree with the medication regimen outlined above.  Post surgical restrictions were reviewed with the patient.  Follow-up as outlined above.  [name], M.D. 11/14/2021 10:55 AM

## 2021-11-14 NOTE — Progress Notes (Signed)
D/c tele and Ivs. Went over AVS with pt and all questions were addressed.  Rodrigo Mcgranahan S Valita Righter, RN  

## 2021-11-14 NOTE — Progress Notes (Signed)
CARDIAC REHAB PHASE I   PRE:  Rate/Rhythm: 79 SR  BP:  Sitting: 147/71      SaO2: 96 RA  MODE:  Ambulation: 470 ft   POST:  Rate/Rhythm: 69 SR  BP:  Sitting: 169/70      SaO2: 97 RA   Pt ambulated in hall with standby assist only. Tolerated well with no sob or dizziness. Back to room to chair with call bell and bedside table in reach. Home teaching including site care, heart healthy diet, exercise guidelines, risk factors, restrictions and CRP2 reviewed. All questions and concerns addressed. Will refer to [facility] for CRP2. Plan for home today.   [phone]   Vanessa Barbara, RN BSN 11/14/2021 8:46 AM

## 2021-11-14 NOTE — Progress Notes (Signed)
Echocardiogram 2D Echocardiogram has been performed.  Cheryl Cooper 11/14/2021, 10:47 AM

## 2021-11-15 ENCOUNTER — Telehealth: Payer: Self-pay | Admitting: Physician Assistant

## 2021-11-15 ENCOUNTER — Other Ambulatory Visit: Payer: Self-pay | Admitting: Family Medicine

## 2021-11-15 DIAGNOSIS — R413 Other amnesia: Secondary | ICD-10-CM

## 2021-11-15 NOTE — Telephone Encounter (Signed)
  [facility] VALVE TEAM   Patient contacted regarding discharge from [facility] on 11/14/21  Patient understands to follow up with a structural heart APP on 10/4 at [place].  Patient understands discharge instructions? yes Patient understands medications and regimen? yes Patient understands to bring all medications to this visit? yes  Angelena Form PA-C  [facility]

## 2021-11-19 DIAGNOSIS — H11003 Unspecified pterygium of eye, bilateral: Secondary | ICD-10-CM | POA: Diagnosis not present

## 2021-11-19 DIAGNOSIS — H25811 Combined forms of age-related cataract, right eye: Secondary | ICD-10-CM | POA: Diagnosis not present

## 2021-11-19 DIAGNOSIS — H11001 Unspecified pterygium of right eye: Secondary | ICD-10-CM | POA: Diagnosis not present

## 2021-11-19 DIAGNOSIS — Z886 Allergy status to analgesic agent status: Secondary | ICD-10-CM | POA: Diagnosis not present

## 2021-11-19 DIAGNOSIS — H57813 Brow ptosis, bilateral: Secondary | ICD-10-CM | POA: Diagnosis not present

## 2021-11-19 DIAGNOSIS — Z961 Presence of intraocular lens: Secondary | ICD-10-CM | POA: Diagnosis not present

## 2021-11-19 DIAGNOSIS — H2511 Age-related nuclear cataract, right eye: Secondary | ICD-10-CM | POA: Diagnosis not present

## 2021-11-19 DIAGNOSIS — Z885 Allergy status to narcotic agent status: Secondary | ICD-10-CM | POA: Diagnosis not present

## 2021-11-19 DIAGNOSIS — Z888 Allergy status to other drugs, medicaments and biological substances status: Secondary | ICD-10-CM | POA: Diagnosis not present

## 2021-11-20 NOTE — Progress Notes (Unsigned)
[place]                                     Cardiology Office Note:    Date:  11/21/2021   ID:  Cheryl Cooper, DOB [birth year], MRN [id number]  PCP:  Libby Maw, MD  [facility] Cardiologist:  Evalina Field, MD  / Dr. Burt Knack & Dr. Lavonna Monarch (TAVR) [place] Electrophysiologist:  None   Referring MD: Libby Maw   [facility] s/p TAVR  History of Present Illness:    Cheryl Cooper is a 81 y.o. female with a hx of HTN, HLD, GERD, hypothyroidism, diet controlled DMT2 and severe AS s/p TAVR (11/13/21) who presents to clinic for follow up.   She has been followed for aortic stenosis for the past 18 months. Her most recent echo 10/08/21 showed EF 70%, with very severe AS with a mean grad 69 mmHg, peak grad 93.7 mmHg, AVA 0.46 cm2, DVI 0.18. She reported progressive symptoms of exertional dyspnea as well as fatigue and intermittent dizziness. Additionally, she will require back surgery and it was recommended that her aortic stenosis be addressed first. She was referred to Dr. Burt Knack for evaluation. [facility] 10/24/21 showed patent coronary arteries (right dominant) with a moderate ostial diagonal stenosis and no other significant coronary artery disease. Normal right heart pressures and cardiac output.  She was evaluated by the multidisciplinary valve team and underwent successful TAVR with a 23 mm Edwards Sapien 3 Ultra Resilia THV via the TF approach on 11/13/21. Post operative echo EF 70%, normally functioning TAVR with a mean gradient of 11 mmHg and trivial PVL. She was started on a baby aspirin.   Today the patient presents to clinic for follow up. No CP or SOB. No LE edema, orthopnea or PND. No dizziness or syncope. No blood in stool or urine. No palpitations. Mostly having issues with her back pain. Looking forward to surgery. Has more energy since TAVR.     Past Medical History:  Diagnosis Date   Anxiety     Arthritis    Carpal tunnel syndrome    Deafness in left ear    Depression    Diabetes mellitus    diet controlled   Diverticulitis    GERD (gastroesophageal reflux disease)    Hypercholesteremia    Hypertension    Hypothyroid    Memory loss    reports d/t concussion   PONV (postoperative nausea and vomiting) yrs ago, none recent   Post concussion syndrome    S/P TAVR (transcatheter aortic valve replacement) 11/13/2021   s/p TAVR with a 23 mm Edwards S3UR via the TF approach by Dr. Burt Knack and Dr. Lavonna Monarch   Severe aortic stenosis     Past Surgical History:  Procedure Laterality Date   [place]  2010   lower   BIOPSY  01/12/2018   Procedure: BIOPSY;  Surgeon: Irving Copas., MD;  Location: [facility];  Service: Gastroenterology;;   [place] RELEASE Right 2023   CHOLECYSTECTOMY     ESOPHAGOGASTRODUODENOSCOPY (EGD) WITH PROPOFOL N/A 01/12/2018   Procedure: ESOPHAGOGASTRODUODENOSCOPY (EGD) WITH PROPOFOL;  Surgeon: Irving Copas., MD;  Location: [facility];  Service: Gastroenterology;  Laterality: N/A;   EYE SURGERY Right    cataract   INTRAOPERATIVE TRANSTHORACIC ECHOCARDIOGRAM N/A 11/13/2021   Procedure: INTRAOPERATIVE TRANSTHORACIC ECHOCARDIOGRAM;  Surgeon: [name], MD;  Location: [place];  Service: Open Heart Surgery;  Laterality: N/A;   left elobow surgery  2003   left rotator cuff  2001   LUMBAR LAMINECTOMY/DECOMPRESSION MICRODISCECTOMY Left 12/23/2017   Procedure: Laminectomy for facet/synovial cyst - left - Lumbar three-Lumbar four;  Surgeon: Earnie Larsson, MD;  Location: [place];  Service: Neurosurgery;  Laterality: Left;   r foot surgery  1995   REVERSE SHOULDER ARTHROPLASTY Left 10/23/2018   Procedure: REVERSE TOTAL SHOULDER ARTHROPLASTY;  Surgeon: Netta Cedars, MD;  Location: [facility];  Service: Orthopedics;  Laterality: Left;  interscalene block   REVERSE SHOULDER ARTHROPLASTY Right 04/20/2021   Procedure:  REVERSE SHOULDER ARTHROPLASTY;  Surgeon: Netta Cedars, MD;  Location: [facility];  Service: Orthopedics;  Laterality: Right;  with ISB   right hand surgery  2001   RIGHT HEART CATH AND CORONARY ANGIOGRAPHY N/A 10/24/2021   Procedure: RIGHT HEART CATH AND CORONARY ANGIOGRAPHY;  Surgeon: [name], MD;  Location: [place] [facility];  Service: Cardiovascular;  Laterality: N/A;   right index finger surgery  2009   SHOULDER OPEN ROTATOR CUFF REPAIR  11/21/2011   Procedure: ROTATOR CUFF REPAIR SHOULDER OPEN;  Surgeon: Johnn Hai, MD;  Location: [facility];  Service: Orthopedics;  Laterality: Right;  with subacromial decompression   SHOULDER OPEN ROTATOR CUFF REPAIR Right 05/09/2016   Procedure: Right shoulder mini open revision rotator cuff repair, subacromial decompression;  Surgeon: Susa Day, MD;  Location: [facility];  Service: Orthopedics;  Laterality: Right;   TOTAL HIP ARTHROPLASTY Right 04/24/2017   Procedure: RIGHT TOTAL HIP ARTHROPLASTY ANTERIOR APPROACH;  Surgeon: Rod Can, MD;  Location: [facility];  Service: Orthopedics;  Laterality: Right;  Needs RNFA   TRANSCATHETER AORTIC VALVE REPLACEMENT, TRANSFEMORAL N/A 11/13/2021   Procedure: Transcatheter Aortic Valve Replacement, Transfemoral Edwards 23 MM SAPIEN 3 Ultra;  Surgeon: [name], MD;  Location: [place];  Service: Open Heart Surgery;  Laterality: N/A;  Transfemoral approach    Current Medications: Current Meds  Medication Sig   amLODipine (NORVASC) 5 MG tablet TAKE 1 AND 1/2 TABLET BY MOUTH DAILY   amoxicillin (AMOXIL) 500 MG tablet Take 500 mg by mouth as directed. As prescribed by dentist   aspirin 81 MG chewable tablet Chew 1 tablet (81 mg total) by mouth daily.   Calcium Carbonate-Vitamin D (CALCIUM-D PO) Take 2 tablets by mouth daily. CHEWABLES   Cholecalciferol 50 MCG (2000 UT) TABS Take 2,000 Units by mouth in the morning.   Cyanocobalamin (B-12 PO) Take 1 tablet by mouth daily.   donepezil (ARICEPT) 5 MG tablet  TAKE ONE TABLET BY MOUTH AT BEDTIME   escitalopram (LEXAPRO) 20 MG tablet Take 1 tablet (20 mg total) by mouth daily.   esomeprazole (NEXIUM) 40 MG capsule Take 1 capsule (40 mg total) by mouth 2 (two) times daily before a meal.   ezetimibe (ZETIA) 10 MG tablet TAKE ONE TABLET BY MOUTH DAILY   ferrous sulfate 325 (65 FE) MG tablet Take 325 mg by mouth daily with breakfast.   FIBER ADULT GUMMIES PO Take 2 tablets by mouth daily.   GINKGO BILOBA PO Take 1 capsule by mouth in the morning.   glucose blood test strip daily.   Lactobacillus (DIGESTIVE HEALTH PROBIOTIC PO) Take 2 capsules by mouth in the morning.   levothyroxine (SYNTHROID) 75 MCG tablet Take 1 tablet (75 mcg total) by mouth daily with breakfast.   Multiple Vitamins-Minerals (WOMENS MULTI GUMMIES PO) Take 2 tablets by mouth daily.  oxybutynin (DITROPAN XL) 15 MG 24 hr tablet TAKE ONE TABLET BY MOUTH AT BEDTIME   rosuvastatin (CRESTOR) 10 MG tablet TAKE ONE TABLET BY MOUTH DAILY   traMADol (ULTRAM) 50 MG tablet Take 1 tablet (50 mg total) by mouth every 6 (six) hours as needed.   traZODone (DESYREL) 50 MG tablet Take 0.5 tablets (25 mg total) by mouth at bedtime.     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  Tobacco Use   Smoking status: Never   Smokeless tobacco: Never  Vaping Use   Vaping Use: Never used  Substance and Sexual Activity   Alcohol use: Yes    Alcohol/week: 1.0 standard drink of alcohol    Types: 1 Glasses of wine per week    Comment: occas   Drug use: No   Sexual activity: Not Currently    Comment: 1st intercourse 81 yo-Fewer than 5 partners  Other Topics Concern   Not on  file  Social History Narrative   Married, lives at home with husband   caffeine - 1 Coke daily   Social Determinants of Health   Financial Resource Strain: Not on file  Food Insecurity: Unknown (11/14/2021)   Hunger Vital Sign    Worried About Running Out of Food in the Last Year: Patient refused    Ran Out of Food in the Last Year: Patient refused  Transportation Needs: Unknown (11/14/2021)   PRAPARE - Hydrologist (Medical): Patient refused    Lack of Transportation (Non-Medical): Patient refused  Physical Activity: Not on file  Stress: Not on file  Social Connections: Not on file     Family History: The patient's family history includes Dementia in her brother; Diabetes in her brother; Heart attack in her father and mother; Heart disease in her brother; Stroke in her brother, father, sister, and sister.  ROS:   Please see the history of present illness.    All other systems reviewed and are negative.  EKGs/Labs/Other Studies Reviewed:    The following studies were reviewed today:  TAVR OPERATIVE NOTE     Date of Procedure:                11/13/2021   Preoperative Diagnosis:      Severe Aortic Stenosis    Postoperative Diagnosis:    Same    Procedure:        Transcatheter Aortic Valve Replacement - Percutaneous  Transfemoral Approach             Edwards Sapien 3 Ultra Resilia THV (size 23 mm, model number [id number], serial # [id number])              Co-Surgeons:                        Coralie Common, MD and [name], MD   Anesthesiologist:                  Annye Asa, MD   Echocardiographer:              Rudean Haskell, MD   Pre-operative Echo Findings: Severe bicuspid aortic  stenosis Normal/vigorous left ventricular systolic function   Post-operative Echo Findings: No paravalvular leak Unchanged left ventricular systolic function _____________     Echo 11/14/21:  IMPRESSIONS  1. There is a 23 mm Sapien prosthetic  (TAVR) valve present in the aortic  position. No signficiant PVL (trivial at the 12 o'clock position on PSAX).  Mean gradient 11 mm Hg, Peak gradient 24 mm Hg, DVI 0.61, EOA 2.33 cm2.   2. Left ventricular ejection fraction, by estimation, is 70 to 75%. The  left ventricle has hyperdynamic function. The left ventricle has no  regional wall motion abnormalities. There is moderate concentric left  ventricular hypertrophy. Left ventricular  diastolic parameters are indeterminate. Elevated left atrial pressure.   3. Right ventricular systolic function is normal. The right ventricular  size is normal. Tricuspid regurgitation signal is inadequate for assessing  PA pressure.   4. The mitral valve is degenerative. No evidence of mitral valve  regurgitation.   EKG:  EKG is ordered today.  The ekg ordered today demonstrates sinus with 1st dev AV block HR 56  Recent Labs: 03/20/2021: TSH 2.24 11/09/2021: ALT 37 11/14/2021: BUN 8; Creatinine, Ser 0.88; Hemoglobin 12.5; Magnesium 1.9; Platelets 133; Potassium 3.8; Sodium 137  Recent Lipid Panel    Component Value Date/Time   CHOL 150 07/28/2020 0856   CHOL 147 01/21/2020 0958   TRIG 193.0 (H) 07/28/2020 0856   HDL 47.50 07/28/2020 0856   HDL 48 01/21/2020 0958   CHOLHDL 3 07/28/2020 0856   VLDL 38.6 07/28/2020 0856   LDLCALC 63 07/28/2020 0856   LDLCALC 77 01/21/2020 0958   LDLDIRECT 53.0 03/20/2021 1652     Risk Assessment/Calculations:       Physical Exam:    VS:  BP 116/72   Pulse (!) 56   Ht '5\' 3"'$  (1.6 m)   Wt 140 lb (63.5 kg)   BMI 24.80 kg/m     Wt Readings from Last 3 Encounters:  11/21/21 140 lb (63.5 kg)  11/14/21 141 lb 3.2 oz (64 kg)  11/09/21 140 lb 11.2 oz (63.8 kg)     GEN:  Well nourished, well developed in no acute distress HEENT: Normal NECK: No JVD LYMPHATICS: No lymphadenopathy CARDIAC: RRR, no murmurs, rubs, gallops RESPIRATORY:  Clear to auscultation without rales, wheezing or rhonchi  ABDOMEN: Soft,  non-tender, non-distended MUSCULOSKELETAL:  No edema; No deformity  SKIN: Warm and dry.  Groin sites clear without hematoma or ecchymosis NEUROLOGIC:  Alert and oriented x 3 PSYCHIATRIC:  Normal affect   ASSESSMENT:    1. S/P TAVR (transcatheter aortic valve replacement)   2. Essential hypertension   3. Mixed hyperlipidemia   4. Chronic back pain, unspecified back location, unspecified back pain laterality    PLAN:    In order of problems listed above:  Severe AS s/p TAVR: doing well 1 week out from TAVR. ECG with no HAVB. Groin sites healing well. Continue a baby Asprin 81 mg daily. SBE prophylaxis discussed; she gets amoxicillin from her dentist. I will see her back for 1 month follow up and echo.    HTN: BP well controlled. Resume home meds  HLD: continue statin and ezetimibe.  Back Pain: this has really been bothering her. Will clear her for upcoming back surgery at 1 month visit. Discussed we like to wait at least 6 weeks out from TAVR. She will make an apt with Dr. Trenton Gammon.      Cardiac Rehabilitation Eligibility Assessment  The patient is ready to  start cardiac rehabilitation from a cardiac standpoint.     Medication Adjustments/Labs and Tests Ordered: Current medicines are reviewed at length with the patient today.  Concerns regarding medicines are outlined above.  Orders Placed This Encounter  Procedures   EKG 12-Lead   No orders of the defined types were placed in this encounter.   Patient Instructions  Medication Instructions:  Your physician recommends that you continue on your current medications as directed. Please refer to the Current Medication list given to you today.  *If you need a refill on your cardiac medications before your next appointment, please call your pharmacy*   Lab Work: None ordered   If you have labs (blood work) drawn today and your tests are completely normal, you will receive your results only by: [place] (if you have  MyChart) OR A paper copy in the mail If you have any lab test that is abnormal or we need to change your treatment, we will call you to review the results.   Testing/Procedures: None ordered    Follow-Up: Follow up as scheduled  Other Instructions   Important Information About Sugar         Signed, Angelena Form, PA-C  11/21/2021 2:03 PM    [place]

## 2021-11-21 ENCOUNTER — Ambulatory Visit: Payer: Medicare PPO | Attending: Physician Assistant | Admitting: Physician Assistant

## 2021-11-21 VITALS — BP 116/72 | HR 56 | Ht 63.0 in | Wt 140.0 lb

## 2021-11-21 DIAGNOSIS — E782 Mixed hyperlipidemia: Secondary | ICD-10-CM | POA: Diagnosis not present

## 2021-11-21 DIAGNOSIS — Z952 Presence of prosthetic heart valve: Secondary | ICD-10-CM | POA: Diagnosis not present

## 2021-11-21 DIAGNOSIS — M549 Dorsalgia, unspecified: Secondary | ICD-10-CM | POA: Diagnosis not present

## 2021-11-21 DIAGNOSIS — I10 Essential (primary) hypertension: Secondary | ICD-10-CM | POA: Diagnosis not present

## 2021-11-21 DIAGNOSIS — G8929 Other chronic pain: Secondary | ICD-10-CM

## 2021-11-21 NOTE — Patient Instructions (Signed)

## 2021-11-28 ENCOUNTER — Other Ambulatory Visit: Payer: Self-pay | Admitting: Gastroenterology

## 2021-11-28 DIAGNOSIS — K7469 Other cirrhosis of liver: Secondary | ICD-10-CM

## 2021-12-03 ENCOUNTER — Ambulatory Visit
Admission: RE | Admit: 2021-12-03 | Discharge: 2021-12-03 | Disposition: A | Discharge status: Home / Self Care | Payer: Medicare PPO | Source: Ambulatory Visit | Attending: Gastroenterology | Admitting: Gastroenterology

## 2021-12-03 DIAGNOSIS — K7469 Other cirrhosis of liver: Secondary | ICD-10-CM

## 2021-12-03 DIAGNOSIS — K7460 Unspecified cirrhosis of liver: Secondary | ICD-10-CM | POA: Diagnosis not present

## 2021-12-04 NOTE — Progress Notes (Signed)
[place]                                     Cardiology Office Note:    Date:  12/12/2021   ID:  Cheryl Cooper, DOB 1940/12/20, MRN [id number]  PCP:  Libby Maw, MD  [facility] Cardiologist:  Evalina Field, MD  / Dr. Burt Knack & Dr. Lavonna Monarch (TAVR) [place] Electrophysiologist:  None   Referring MD: Libby Maw,*   1 month s/p TAVR  History of Present Illness:    Cheryl Cooper is a 81 y.o. female with a hx of HTN, HLD, GERD, hypothyroidism, diet controlled DMT2 and severe AS s/p TAVR (11/13/21) who presents to clinic for follow up.   She has been followed for aortic stenosis by Dr. Audie Box. Her most recent echo 10/08/21 showed EF 70%, with very severe AS with a mean grad 69 mmHg, peak grad 93.7 mmHg, AVA 0.46 cm2, DVI 0.18. She reported progressive symptoms of exertional dyspnea as well as fatigue and intermittent dizziness. Additionally, she will require back surgery and it was recommended that her aortic stenosis be addressed first. She was referred to Dr. Burt Knack for evaluation. [facility] 10/24/21 showed patent coronary arteries (right dominant) with a moderate ostial diagonal stenosis and no other significant coronary artery disease. Normal right heart pressures and cardiac output.  She was evaluated by the multidisciplinary valve team and underwent successful TAVR with a 23 mm Edwards Sapien 3 Ultra Resilia THV via the TF approach on 11/13/21. Post operative echo EF 70%, normally functioning TAVR with a mean gradient of 11 mmHg and trivial PVL. She was started on a baby aspirin.   Today the patient presents to clinic for follow up. Here alone. No CP or SOB. No LE edema, orthopnea or PND. No dizziness or syncope. No blood in stool or urine. No palpitations. Has upcoming cataracts surgery and plans to have back surgery soon. She has weakness related to her back and fell today    Past Medical History:   Diagnosis Date   Anxiety    Arthritis    Carpal tunnel syndrome    Deafness in left ear    Depression    Diabetes mellitus    diet controlled   Diverticulitis    GERD (gastroesophageal reflux disease)    Hypercholesteremia    Hypertension    Hypothyroid    Memory loss    reports d/t concussion   PONV (postoperative nausea and vomiting) yrs ago, none recent   Post concussion syndrome    S/P TAVR (transcatheter aortic valve replacement) 11/13/2021   s/p TAVR with a 23 mm Edwards S3UR via the TF approach by Dr. Burt Knack and Dr. Lavonna Monarch   Severe aortic stenosis     Past Surgical History:  Procedure Laterality Date   [place]  2010   lower   BIOPSY  01/12/2018   Procedure: BIOPSY;  Surgeon: Irving Copas., MD;  Location: [facility];  Service: Gastroenterology;;   [place] RELEASE Right 2023   CHOLECYSTECTOMY     ESOPHAGOGASTRODUODENOSCOPY (EGD) WITH PROPOFOL N/A 01/12/2018   Procedure: ESOPHAGOGASTRODUODENOSCOPY (EGD) WITH PROPOFOL;  Surgeon: Irving Copas., MD;  Location: [facility];  Service: Gastroenterology;  Laterality: N/A;   EYE SURGERY Right    cataract   INTRAOPERATIVE TRANSTHORACIC ECHOCARDIOGRAM N/A 11/13/2021  Procedure: INTRAOPERATIVE TRANSTHORACIC ECHOCARDIOGRAM;  Surgeon: [name], MD;  Location: [place];  Service: Open Heart Surgery;  Laterality: N/A;   left elobow surgery  2003   left rotator cuff  2001   LUMBAR LAMINECTOMY/DECOMPRESSION MICRODISCECTOMY Left 12/23/2017   Procedure: Laminectomy for facet/synovial cyst - left - Lumbar three-Lumbar four;  Surgeon: Earnie Larsson, MD;  Location: [place];  Service: Neurosurgery;  Laterality: Left;   r foot surgery  1995   REVERSE SHOULDER ARTHROPLASTY Left 10/23/2018   Procedure: REVERSE TOTAL SHOULDER ARTHROPLASTY;  Surgeon: Netta Cedars, MD;  [facility]: [facility];  Service: Orthopedics;  Laterality: Left;  interscalene block   REVERSE SHOULDER ARTHROPLASTY  Right 04/20/2021   Procedure: REVERSE SHOULDER ARTHROPLASTY;  Surgeon: Netta Cedars, MD;  [facility];  Service: Orthopedics;  Laterality: Right;  with ISB   right hand surgery  2001   RIGHT HEART CATH AND CORONARY ANGIOGRAPHY N/A 10/24/2021   Procedure: RIGHT HEART CATH AND CORONARY ANGIOGRAPHY;  Surgeon: [name], MD;  Location: [facility];  Service: Cardiovascular;  Laterality: N/A;   right index finger surgery  2009   SHOULDER OPEN ROTATOR CUFF REPAIR  11/21/2011   Procedure: ROTATOR CUFF REPAIR SHOULDER OPEN;  Surgeon: Johnn Hai, MD;  [facility]: [facility];  Service: Orthopedics;  Laterality: Right;  with subacromial decompression   SHOULDER OPEN ROTATOR CUFF REPAIR Right 05/09/2016   Procedure: Right shoulder mini open revision rotator cuff repair, subacromial decompression;  Surgeon: Susa Day, MD;  [facility];  Service: Orthopedics;  Laterality: Right;   TOTAL HIP ARTHROPLASTY Right 04/24/2017   Procedure: RIGHT TOTAL HIP ARTHROPLASTY ANTERIOR APPROACH;  Surgeon: Rod Can, MD;  [facility];  Service: Orthopedics;  Laterality: Right;  Needs RNFA   TRANSCATHETER AORTIC VALVE REPLACEMENT, TRANSFEMORAL N/A 11/13/2021   Procedure: Transcatheter Aortic Valve Replacement, Transfemoral Edwards 23 MM SAPIEN 3 Ultra;  Surgeon: [name], MD;  Location: [place];  Service: Open Heart Surgery;  Laterality: N/A;  Transfemoral approach    Current Medications: Current Meds  Medication Sig   amLODipine (NORVASC) 5 MG tablet TAKE 1 AND 1/2 TABLET BY MOUTH DAILY   amoxicillin (AMOXIL) 500 MG tablet Take 500 mg by mouth as directed. As prescribed by dentist   aspirin 81 MG chewable tablet Chew 1 tablet (81 mg total) by mouth daily.   Calcium Carbonate-Vitamin D (CALCIUM-D PO) Take 2 tablets by mouth daily. CHEWABLES   Cholecalciferol 50 MCG (2000 UT) TABS Take 2,000 Units by mouth in the morning.   Cyanocobalamin (B-12 PO) Take 1 tablet by mouth daily.    escitalopram (LEXAPRO) 20 MG tablet Take 1 tablet (20 mg total) by mouth daily.   esomeprazole (NEXIUM) 40 MG capsule Take 1 capsule (40 mg total) by mouth 2 (two) times daily before a meal.   ezetimibe (ZETIA) 10 MG tablet TAKE ONE TABLET BY MOUTH DAILY   ferrous sulfate 325 (65 FE) MG tablet Take 325 mg by mouth daily with breakfast.   FIBER ADULT GUMMIES PO Take 2 tablets by mouth daily.   GINKGO BILOBA PO Take 1 capsule by mouth in the morning.   glucose blood test strip daily.   Lactobacillus (DIGESTIVE HEALTH PROBIOTIC PO) Take 2 capsules by mouth in the morning.   levothyroxine (SYNTHROID) 75 MCG tablet Take 1 tablet (75 mcg total) by mouth daily with breakfast.   Multiple Vitamins-Minerals (WOMENS MULTI GUMMIES PO) Take 2 tablets by mouth daily.   oxybutynin (DITROPAN XL) 15 MG 24 hr  tablet TAKE ONE TABLET BY MOUTH AT BEDTIME   rosuvastatin (CRESTOR) 10 MG tablet TAKE ONE TABLET BY MOUTH DAILY   traMADol (ULTRAM) 50 MG tablet Take 1 tablet (50 mg total) by mouth every 6 (six) hours as needed.   traZODone (DESYREL) 50 MG tablet Take 0.5 tablets (25 mg total) by mouth at bedtime.     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  Tobacco Use   Smoking status: Never   Smokeless tobacco: Never  Vaping Use   Vaping Use: Never used  Substance and Sexual Activity   Alcohol use: Yes    Alcohol/week: 1.0 standard drink of alcohol    Types: 1 Glasses of wine per week    Comment: occas   Drug use: No   Sexual activity: Not Currently    Comment: 1st intercourse 81 yo-Fewer than 5 partners  Other Topics Concern   Not on file  Social History Narrative    Married, lives at home with husband   caffeine - 1 Coke daily   Social Determinants of Health   Financial Resource Strain: Not on file  Food Insecurity: Unknown (11/14/2021)   Hunger Vital Sign    Worried About Running Out of Food in the Last Year: Patient refused    Ran Out of Food in the Last Year: Patient refused  Transportation Needs: Unknown (11/14/2021)   PRAPARE - Hydrologist (Medical): Patient refused    Lack of Transportation (Non-Medical): Patient refused  Physical Activity: Not on file  Stress: Not on file  Social Connections: Not on file     Family History: The patient's family history includes Dementia in her brother; Diabetes in her brother; Heart attack in her father and mother; Heart disease in her brother; Stroke in her brother, father, sister, and sister.  ROS:   Please see the history of present illness.    All other systems reviewed and are negative.  EKGs/Labs/Other Studies Reviewed:    The following studies were reviewed today:  TAVR OPERATIVE NOTE     Date of Procedure:                11/13/2021   Preoperative Diagnosis:      Severe Aortic Stenosis    Postoperative Diagnosis:    Same    Procedure:        Transcatheter Aortic Valve Replacement - Percutaneous  Transfemoral Approach             Edwards Sapien 3 Ultra Resilia THV (size 23 mm, model number [id number], serial # [id number])              Co-Surgeons:                        Coralie Common, MD and [name], MD   Anesthesiologist:                  Annye Asa, MD   Echocardiographer:              Rudean Haskell, MD   Pre-operative Echo Findings: Severe bicuspid aortic stenosis Normal/vigorous left ventricular systolic function  Post-operative Echo Findings: No paravalvular leak Unchanged left ventricular systolic function _____________     Echo 11/14/21:  IMPRESSIONS  1. There is a 23 mm Sapien prosthetic (TAVR) valve present in the aortic   position. No signficiant PVL (trivial at the 12 o'clock position on PSAX).  Mean gradient 11 mm Hg, Peak gradient 24 mm Hg, DVI 0.61, EOA 2.33 cm2.   2. Left ventricular ejection fraction, by estimation, is 70 to 75%. The  left ventricle has hyperdynamic function. The left ventricle has no  regional wall motion abnormalities. There is moderate concentric left  ventricular hypertrophy. Left ventricular  diastolic parameters are indeterminate. Elevated left atrial pressure.   3. Right ventricular systolic function is normal. The right ventricular  size is normal. Tricuspid regurgitation signal is inadequate for assessing  PA pressure.   4. The mitral valve is degenerative. No evidence of mitral valve  regurgitation.   _________________________  Echo 12/12/21 IMPRESSIONS  1. The aortic valve has been replaced by a 23 mm Sapien Valve. Aortic valve regurgitation is trivial (PVL at the 12 o'clock position). Aortic valve mean gradient measures 14.0 mmHg. DVI 0.41.  2. Left ventricular ejection fraction, by estimation, is 70 to 75%. The left ventricle has hyperdynamic function. The left ventricle has no regional wall motion abnormalities. There is moderate asymmetric left ventricular hypertrophy of the septal  segment. Left ventricular diastolic parameters are consistent with Grade I diastolic dysfunction (impaired relaxation).There is an increase intracavitary gradient that increased with Valsalva without an LVOT gradient.  3. Right ventricular systolic function is normal. The right ventricular size is normal. There is normal pulmonary artery systolic pressure.  4. The mitral valve is degenerative. No evidence of mitral valve regurgitation.   Comparison(s): Prior images reviewed side by side. Stable prosthetic parameters.  EKG:  EKG is NOT ordered today.    Recent Labs: 03/20/2021: TSH 2.24 11/09/2021: ALT 37 11/14/2021: BUN 8; Creatinine, Ser 0.88; Hemoglobin 12.5; Magnesium 1.9; Platelets  133; Potassium 3.8; Sodium 137  Recent Lipid Panel    Component Value Date/Time   CHOL 150 07/28/2020 0856   CHOL 147 01/21/2020 0958   TRIG 193.0 (H) 07/28/2020 0856   HDL 47.50 07/28/2020 0856   HDL 48 01/21/2020 0958   CHOLHDL 3 07/28/2020 0856   VLDL 38.6 07/28/2020 0856   LDLCALC 63 07/28/2020 0856   LDLCALC 77 01/21/2020 0958   LDLDIRECT 53.0 03/20/2021 1652     Risk Assessment/Calculations:       Physical Exam:    VS:  BP 118/70   Pulse 63   Ht '5\' 3"'$  (1.6 m)   Wt 143 lb 6.4 oz (65 kg)   SpO2 98%   BMI 25.40 kg/m     Wt Readings from Last 3 Encounters:  12/12/21 143 lb 6.4 oz (65 kg)  11/21/21 140 lb (63.5 kg)  11/14/21 141 lb 3.2 oz (64 kg)     GEN:  Well nourished, well developed in no acute distress HEENT: Normal NECK: No JVD LYMPHATICS: No lymphadenopathy CARDIAC: RRR, no murmurs, rubs, gallops RESPIRATORY:  Clear to auscultation without rales, wheezing or rhonchi  ABDOMEN: Soft, non-tender, non-distended MUSCULOSKELETAL:  No edema; No deformity  SKIN: Warm and dry NEUROLOGIC:  Alert and oriented x 3 PSYCHIATRIC:  Normal affect   ASSESSMENT:    1. S/P TAVR (transcatheter aortic valve replacement)   2. Essential hypertension   3. Mixed hyperlipidemia   4. Chronic back pain, unspecified back location, unspecified back pain laterality  PLAN:    In order of problems listed above:  Severe AS s/p TAVR: echo today shows EF 70%, moderate asymmetric LVH of the septal segment, there is an increase intracavitary gradient that increased with Valsalva without an LVOT gradient as well as a normally functioning TAVR with a mean gradient of 14 mm hg and trivial PVL. She has NYHA class I symptoms. Continue a baby Asprin 81 mg daily. SBE prophylaxis discussed; she gets amoxicillin from her dentist. I will see her back for 1 year follow up and echo.    HTN: BP well controlled.  HLD: continue statin and ezetimibe.  Back Pain: this has really been bothering  her. Cleared for back surgery. She will make an apt with Dr. Trenton Gammon.   Medication Adjustments/Labs and Tests Ordered: Current medicines are reviewed at length with the patient today.  Concerns regarding medicines are outlined above.  Orders Placed This Encounter  Procedures   ECHOCARDIOGRAM COMPLETE   No orders of the defined types were placed in this encounter.   Patient Instructions  Medication Instructions:  Your physician recommends that you continue on your current medications as directed. Please refer to the Current Medication list given to you today.  *If you need a refill on your cardiac medications before your next appointment, please call your pharmacy*   Lab Work: None ordered   If you have labs (blood work) drawn today and your tests are completely normal, you will receive your results only by: [place] (if you have MyChart) OR A paper copy in the mail If you have any lab test that is abnormal or we need to change your treatment, we will call you to review the results.   Testing/Procedures: Your physician has requested that you have an echocardiogram in September 2024. Echocardiography is a painless test that uses sound waves to create images of your heart. It provides your doctor with information about the size and shape of your heart and how well your heart's chambers and valves are working. This procedure takes approximately one hour. There are no restrictions for this procedure. Please do NOT wear cologne, perfume, aftershave, or lotions (deodorant is allowed). Please arrive 15 minutes prior to your appointment time.    Follow-Up: Follow up as scheduled   Other Instructions   Important Information About Sugar         Signed, Angelena Form, PA-C  12/12/2021 4:03 PM    [facility]

## 2021-12-12 ENCOUNTER — Ambulatory Visit (HOSPITAL_COMMUNITY): Payer: Medicare PPO | Attending: Internal Medicine | Admitting: Physician Assistant

## 2021-12-12 ENCOUNTER — Ambulatory Visit (HOSPITAL_BASED_OUTPATIENT_CLINIC_OR_DEPARTMENT_OTHER): Payer: Medicare PPO

## 2021-12-12 VITALS — BP 118/70 | HR 63 | Ht 63.0 in | Wt 143.4 lb

## 2021-12-12 DIAGNOSIS — Z952 Presence of prosthetic heart valve: Secondary | ICD-10-CM

## 2021-12-12 DIAGNOSIS — G8929 Other chronic pain: Secondary | ICD-10-CM

## 2021-12-12 DIAGNOSIS — I10 Essential (primary) hypertension: Secondary | ICD-10-CM | POA: Diagnosis not present

## 2021-12-12 DIAGNOSIS — E782 Mixed hyperlipidemia: Secondary | ICD-10-CM | POA: Diagnosis not present

## 2021-12-12 DIAGNOSIS — M549 Dorsalgia, unspecified: Secondary | ICD-10-CM | POA: Diagnosis not present

## 2021-12-12 LAB — ECHOCARDIOGRAM COMPLETE
AR max vel: 1.58 cm2
AV Area VTI: 1.65 cm2
AV Area mean vel: 1.76 cm2
AV Mean grad: 14 mmHg
AV Peak grad: 33.6 mmHg
Ao pk vel: 2.9 m/s
Area-P 1/2: 1.55 cm2
S' Lateral: 1.9 cm

## 2021-12-12 NOTE — Patient Instructions (Signed)
Medication Instructions:  Your physician recommends that you continue on your current medications as directed. Please refer to the Current Medication list given to you today.  *If you need a refill on your cardiac medications before your next appointment, please call your pharmacy*   Lab Work: None ordered   If you have labs (blood work) drawn today and your tests are completely normal, you will receive your results only by: [place] (if you have MyChart) OR A paper copy in the mail If you have any lab test that is abnormal or we need to change your treatment, we will call you to review the results.   Testing/Procedures: Your physician has requested that you have an echocardiogram in September 2024. Echocardiography is a painless test that uses sound waves to create images of your heart. It provides your doctor with information about the size and shape of your heart and how well your heart's chambers and valves are working. This procedure takes approximately one hour. There are no restrictions for this procedure. Please do NOT wear cologne, perfume, aftershave, or lotions (deodorant is allowed). Please arrive 15 minutes prior to your appointment time.    Follow-Up: Follow up as scheduled   Other Instructions   Important Information About Sugar

## 2021-12-17 ENCOUNTER — Telehealth: Payer: Self-pay

## 2021-12-17 NOTE — Telephone Encounter (Signed)
   Pre-operative Risk Assessment    Patient Name: Cheryl Cooper  DOB: Aug 26, 1940 MRN: [id number]      Request for Surgical Clearance    Procedure:   L3-4 Anterior Lateral Lumbar Fusion  Date of Surgery:  Clearance TBD                                 Surgeon:  Dr Earnie Larsson Surgeon's Group or Practice Name:  [facility]  Phone number:  [phone] Fax number:  [phone]   Type of Clearance Requested:   - Medical    Type of Anesthesia:  General    Additional requests/questions:  Please fax a copy of pre op clearance fax to [phone]  to the surgeon's office.  Signed, Jeanmarie Plant Petronella Shuford  CCMA 12/17/2021, 4:57 PM

## 2021-12-18 DIAGNOSIS — H25811 Combined forms of age-related cataract, right eye: Secondary | ICD-10-CM | POA: Diagnosis not present

## 2021-12-18 NOTE — Telephone Encounter (Signed)
   Name: Cheryl Cooper  DOB: [birth year]  MRN: [id number]  Primary Cardiologist: Evalina Field, MD  Chart reviewed as part of pre-operative protocol coverage. Because of Cheryl Cooper past medical history and time since last visit, she will require a follow-up telephone visit in order to better assess preoperative cardiovascular risk.  Pre-op covering staff: - Please schedule appointment and call patient to inform them. If patient already had an upcoming appointment within acceptable timeframe, please add "pre-op clearance" to the appointment notes so provider is aware. - Please contact requesting surgeon's office via preferred method (i.e, phone, fax) to inform them of need for appointment prior to surgery.  Will route note to Angelena Form, NP to determine if patient can come off Aspirin prior to surgery.  Patient does require SBE prophylaxis due to history of s/p TAVR sx this year.    Finis Bud, NP  12/18/2021, 10:26 AM

## 2021-12-18 NOTE — Telephone Encounter (Signed)
Left message for the pt to call back to schedule a tele pre op appt 

## 2021-12-18 NOTE — Telephone Encounter (Signed)
   Name: Cheryl Cooper  DOB: Apr 22, 1940  MRN: [id number]  Primary Cardiologist: Evalina Field, MD  Chart reviewed as part of pre-operative protocol coverage. Because of [name] past medical history and time since last visit, she will require a follow-up telephone visit in order to better assess preoperative cardiovascular risk.  Pre-op covering staff: - Please schedule appointment and call patient to inform them. If patient already had an upcoming appointment within acceptable timeframe, please add "pre-op clearance" to the appointment notes so provider is aware. - Please contact requesting surgeon's office via preferred method (i.e, phone, fax) to inform them of need for appointment prior to surgery.  According to Angelena Form, NP okay to hold ASA for 7 days prior to surgery. Pt does require SBE prophylaxis due to history of s/p TAVR sx this year.    Finis Bud, NP  12/18/2021, 11:42 AM

## 2021-12-19 ENCOUNTER — Telehealth: Payer: Self-pay | Admitting: *Deleted

## 2021-12-19 DIAGNOSIS — Z888 Allergy status to other drugs, medicaments and biological substances status: Secondary | ICD-10-CM | POA: Diagnosis not present

## 2021-12-19 DIAGNOSIS — R600 Localized edema: Secondary | ICD-10-CM

## 2021-12-19 DIAGNOSIS — Z48810 Encounter for surgical aftercare following surgery on the sense organs: Secondary | ICD-10-CM | POA: Diagnosis not present

## 2021-12-19 DIAGNOSIS — Z961 Presence of intraocular lens: Secondary | ICD-10-CM | POA: Diagnosis not present

## 2021-12-19 DIAGNOSIS — Z886 Allergy status to analgesic agent status: Secondary | ICD-10-CM | POA: Diagnosis not present

## 2021-12-19 DIAGNOSIS — Z885 Allergy status to narcotic agent status: Secondary | ICD-10-CM | POA: Diagnosis not present

## 2021-12-19 DIAGNOSIS — H02403 Unspecified ptosis of bilateral eyelids: Secondary | ICD-10-CM | POA: Diagnosis not present

## 2021-12-19 DIAGNOSIS — H11003 Unspecified pterygium of eye, bilateral: Secondary | ICD-10-CM | POA: Diagnosis not present

## 2021-12-19 NOTE — Telephone Encounter (Signed)
I was speaking with the pt about pre op appt when the pt tells me about some leg swelling.   Pt tells me that for about 2-3 weeks she has had left leg swelling she states in her thigh and calf area. I asked her to check for pitting and she pushed her finger and said it hurt to do that and finger indent stayed. I assured her that I will send a message to Dr. Burt Knack nurse. Pt states she had TAVR about 1 month ago. I asked if she called [place] to let us know about the swelling but pt answered no.   Se denies any sob or cp.

## 2021-12-19 NOTE — Telephone Encounter (Signed)
Patient is returning call.  °

## 2021-12-19 NOTE — Telephone Encounter (Signed)
Pt agreeable to plan of care for tele pre op appt 12/24/21 @ 9:40. Med rec and consent are done.     Patient Consent for Virtual Visit        Cheryl Cooper has provided verbal consent on 12/19/2021 for a virtual visit (video or telephone).   CONSENT FOR VIRTUAL VISIT FOR:  Cheryl Cooper  By participating in this virtual visit I agree to the following:  I hereby voluntarily request, consent and authorize [place] and its employed or contracted physicians, physician assistants, nurse practitioners or other licensed health care professionals (the Practitioner), to provide me with telemedicine health care services (the "Services") as deemed necessary by the treating Practitioner. I acknowledge and consent to receive the Services by the Practitioner via telemedicine. I understand that the telemedicine visit will involve communicating with the Practitioner through live audiovisual communication technology and the disclosure of certain medical information by electronic transmission. I acknowledge that I have been given the opportunity to request an in-person assessment or other available alternative prior to the telemedicine visit and am voluntarily participating in the telemedicine visit.  I understand that I have the right to withhold or withdraw my consent to the use of telemedicine in the course of my care at any time, without affecting my right to future care or treatment, and that the Practitioner or I may terminate the telemedicine visit at any time. I understand that I have the right to inspect all information obtained and/or recorded in the course of the telemedicine visit and may receive copies of available information for a reasonable fee.  I understand that some of the potential risks of receiving the Services via telemedicine include:  Delay or interruption in medical evaluation due to technological equipment failure or disruption; Information transmitted may not be sufficient  (e.g. poor resolution of images) to allow for appropriate medical decision making by the Practitioner; and/or  In rare instances, security protocols could fail, causing a breach of personal health information.  Furthermore, I acknowledge that it is my responsibility to provide information about my medical history, conditions and care that is complete and accurate to the best of my ability. I acknowledge that Practitioner's advice, recommendations, and/or decision may be based on factors not within their control, such as incomplete or inaccurate data provided by me or distortions of diagnostic images or specimens that may result from electronic transmissions. I understand that the practice of medicine is not an exact science and that Practitioner makes no warranties or guarantees regarding treatment outcomes. I acknowledge that a copy of this consent can be made available to me via my patient portal ([facility]), or I can request a printed copy by calling the office of [facility].    I understand that my insurance will be billed for this visit.   I have read or had this consent read to me. I understand the contents of this consent, which adequately explains the benefits and risks of the Services being provided via telemedicine.  I have been provided ample opportunity to ask questions regarding this consent and the Services and have had my questions answered to my satisfaction. I give my informed consent for the services to be provided through the use of telemedicine in my medical care

## 2021-12-19 NOTE — Telephone Encounter (Signed)
Returned call to patient regarding RLE. She states that her calf and thigh on right leg only, are hurting and swollen. She states the swelling is not present upon waking, but progresses throughout day, each day. She denies any areas of the leg that are red or hot to touch. Says that walking makes her leg hurt as well. Says this all began a few weeks ago and just seems to be worsening. Has had a R hip replacement two years ago and is unsure if it could be being exacerbated by her back. She went to have cataract removed yesterday on her R eye and the opthalmologist advised her that her swelling could be coming from her heart. She states that the cath insertion sites for recent TAVR are completely healed with no knots or redness, "they don't bother me at all." Patient does not wear compression socks and states she elevates her legs 'sometimes.' I advised the patient that ultimately, we need a LE ultrasound to r/o DVT, but that her symptoms don't sound super concerning for that (swelling recedes, no localized tender areas, no rubor). Pt verifies that she is still taking her Aspirin '81mg'$  daily. Will route to primary MD to address as it doesn't sound overly acute or a direct result of her TAVR procedure (5 weeks out).

## 2021-12-19 NOTE — Telephone Encounter (Signed)
Pt agreeable to plan of care for tele pre op appt 12/24/21 @ 9:40. Med rec and consent are done.

## 2021-12-20 NOTE — Telephone Encounter (Signed)
Spoke with NL scheduler- placed on schedule tomorrow at 12:00 PM. I contacted patient to make her aware, she did not answer. I left a message to call back to confirm the appointment date and time.   Left call back number.

## 2021-12-20 NOTE — Telephone Encounter (Signed)
I called patient back, spoke with her about appointment tomorrow.   Patient is aware, she will come tomorrow at 12:00 PM.   Thanks!

## 2021-12-20 NOTE — Telephone Encounter (Signed)
Cheryl Cooper, Cheryl Freer, MD  Donnalee Curry K Cc: Caprice Beaver, LPN Caller: Unspecified (Yesterday,  4:22 PM) Can we set her up for an urgent DVT study this week?  [name] T. Audie Box, MD, [place] [address], [place], [place] [address] [phone] 8:23 PM  Called and spoke with patient who understands the need for DVT study and agrees to plan. Urgent order placed at this time.

## 2021-12-21 ENCOUNTER — Ambulatory Visit (HOSPITAL_COMMUNITY)
Admission: RE | Admit: 2021-12-21 | Discharge: 2021-12-21 | Disposition: A | Discharge status: Home / Self Care | Payer: Medicare PPO | Source: Ambulatory Visit | Attending: Cardiovascular Disease | Admitting: Cardiovascular Disease

## 2021-12-21 DIAGNOSIS — R600 Localized edema: Secondary | ICD-10-CM | POA: Insufficient documentation

## 2021-12-23 NOTE — Progress Notes (Unsigned)
Virtual Visit via Telephone Note   Because of Cheryl Cooper co-morbid illnesses, she is at least at moderate risk for complications without adequate follow up.  This format is felt to be most appropriate for this patient at this time.  The patient did not have access to video technology/had technical difficulties with video requiring transitioning to audio format only (telephone).  All issues noted in this document were discussed and addressed.  No physical exam could be performed with this format.  Please refer to the patient's chart for her consent to telehealth for [facility].  Evaluation Performed:  Preoperative cardiovascular risk assessment _____________   Date:  12/23/2021   Patient ID:  Cheryl Cooper, DOB 03/20/1940, MRN [id number] Patient Location:  Home Provider location:   Office  Primary Care Provider:  Libby Maw, MD Primary Cardiologist:  Evalina Field, MD  Chief Complaint / Patient Profile   81 y.o. y/o female with a h/o aortic stenosis status post TAVR, HTN, HLD, back pain who is pending L3-4 anterior lateral lumbar fusion and presents today for telephonic preoperative cardiovascular risk assessment.  Past Medical History    Past Medical History:  Diagnosis Date   Anxiety    Arthritis    Carpal tunnel syndrome    Deafness in left ear    Depression    Diabetes mellitus    diet controlled   Diverticulitis    GERD (gastroesophageal reflux disease)    Hypercholesteremia    Hypertension    Hypothyroid    Memory loss    reports d/t concussion   PONV (postoperative nausea and vomiting) yrs ago, none recent   Post concussion syndrome    S/P TAVR (transcatheter aortic valve replacement) 11/13/2021   s/p TAVR with a 23 mm Edwards S3UR via the TF approach by Dr. Burt Knack and Dr. Lavonna Monarch   Severe aortic stenosis    Past Surgical History:  Procedure Laterality Date   [place]  2010   lower   BIOPSY   01/12/2018   Procedure: BIOPSY;  Surgeon: Irving Copas., MD;  Location: [facility];  Service: Gastroenterology;;   Wilmon Pali RELEASE Right 2023   CHOLECYSTECTOMY     ESOPHAGOGASTRODUODENOSCOPY (EGD) WITH PROPOFOL N/A 01/12/2018   Procedure: ESOPHAGOGASTRODUODENOSCOPY (EGD) WITH PROPOFOL;  Surgeon: Irving Copas., MD;  Location: [facility];  Service: Gastroenterology;  Laterality: N/A;   EYE SURGERY Right    cataract   INTRAOPERATIVE TRANSTHORACIC ECHOCARDIOGRAM N/A 11/13/2021   Procedure: INTRAOPERATIVE TRANSTHORACIC ECHOCARDIOGRAM;  Surgeon: [name], MD;  Location: [facility];  Service: Open Heart Surgery;  Laterality: N/A;   left elobow surgery  2003   left rotator cuff  2001   LUMBAR LAMINECTOMY/DECOMPRESSION MICRODISCECTOMY Left 12/23/2017   Procedure: Laminectomy for facet/synovial cyst - left - Lumbar three-Lumbar four;  Surgeon: Earnie Larsson, MD;  Location: [facility];  Service: Neurosurgery;  Laterality: Left;   r foot surgery  1995   REVERSE SHOULDER ARTHROPLASTY Left 10/23/2018   Procedure: REVERSE TOTAL SHOULDER ARTHROPLASTY;  Surgeon: Netta Cedars, MD;  Location: [facility];  Service: Orthopedics;  Laterality: Left;  interscalene block   REVERSE SHOULDER ARTHROPLASTY Right 04/20/2021   Procedure: REVERSE SHOULDER ARTHROPLASTY;  Surgeon: Netta Cedars, MD;  Location: [facility];  Service: Orthopedics;  Laterality: Right;  with ISB   right hand surgery  2001   RIGHT HEART CATH AND CORONARY ANGIOGRAPHY N/A 10/24/2021   Procedure: RIGHT HEART CATH AND CORONARY ANGIOGRAPHY;  Surgeon: [name], MD;  Location: [facility];  Service: Cardiovascular;  Laterality: N/A;   right index finger surgery  2009   SHOULDER OPEN ROTATOR CUFF REPAIR  11/21/2011   Procedure: ROTATOR CUFF REPAIR SHOULDER OPEN;  Surgeon: Johnn Hai, MD;  Location: [facility];  Service: Orthopedics;  Laterality: Right;  with subacromial decompression   SHOULDER OPEN ROTATOR CUFF REPAIR  Right 05/09/2016   Procedure: Right shoulder mini open revision rotator cuff repair, subacromial decompression;  Surgeon: Susa Day, MD;  Location: [facility];  Service: Orthopedics;  Laterality: Right;   TOTAL HIP ARTHROPLASTY Right 04/24/2017   Procedure: RIGHT TOTAL HIP ARTHROPLASTY ANTERIOR APPROACH;  Surgeon: Rod Can, MD;  Location: [facility];  Service: Orthopedics;  Laterality: Right;  Needs RNFA   TRANSCATHETER AORTIC VALVE REPLACEMENT, TRANSFEMORAL N/A 11/13/2021   Procedure: Transcatheter Aortic Valve Replacement, Transfemoral Edwards 23 MM SAPIEN 3 Ultra;  Surgeon: [name], MD;  Location: [facility];  Service: Open Heart Surgery;  Laterality: N/A;  Transfemoral approach    Allergies  Allergies  Allergen Reactions   Other Other (See Comments) and Rash    Tomato sauce, garlic, onion - severe acid reflux    Flexeril [Cyclobenzaprine] Other (See Comments)    Per spouse "she felt like a zombie"   Chlordiazepoxide-Clidinium Other (See Comments)    Dizziness (intolerance)   Codeine Nausea Only and Rash   Lipitor [Atorvastatin] Other (See Comments)    Unbalanced    History of Present Illness    [name] is a 81 y.o. female who presents via audio/video conferencing for a telehealth visit today.  Pt was last seen in cardiology clinic on 12/12/2021 by Nell Range, PA-C.  At that time Cheryl Cooper was doing well .  The patient is now pending procedure as outlined above. Since her last visit, she remained stable from a cardiac standpoint.  Today she denies chest pain, shortness of breath, lower extremity edema, fatigue, palpitations, melena, hematuria, hemoptysis, diaphoresis, weakness, presyncope, syncope, orthopnea, and PND.    Home Medications    Prior to Admission medications   Medication Sig Start Date End Date Taking? Authorizing Provider  amLODipine (NORVASC) 5 MG tablet TAKE 1 AND 1/2 TABLET BY MOUTH DAILY 09/16/21   Libby Maw, MD  amoxicillin  (AMOXIL) 500 MG tablet Take 500 mg by mouth as directed. As prescribed by dentist    [provider]  aspirin 81 MG chewable tablet Chew 1 tablet (81 mg total) by mouth daily. 11/14/21   Eileen Stanford, PA-C  Calcium Carbonate-Vitamin D (CALCIUM-D PO) Take 2 tablets by mouth daily. CHEWABLES    [provider]  Cholecalciferol 50 MCG (2000 UT) TABS Take 2,000 Units by mouth in the morning.    [provider]  Cyanocobalamin (B-12 PO) Take 1 tablet by mouth daily.    [provider]  escitalopram (LEXAPRO) 20 MG tablet Take 1 tablet (20 mg total) by mouth daily. 11/02/21   Libby Maw, MD  esomeprazole (NEXIUM) 40 MG capsule Take 1 capsule (40 mg total) by mouth 2 (two) times daily before a meal. 01/13/18   Mikhail, Velta Addison, DO  ezetimibe (ZETIA) 10 MG tablet TAKE ONE TABLET BY MOUTH DAILY 08/27/21   Libby Maw, MD  ferrous sulfate 325 (65 FE) MG tablet Take 325 mg by mouth daily with breakfast.    [provider]  FIBER ADULT GUMMIES PO Take 2 tablets by mouth daily.    [provider]  GINKGO BILOBA PO Take 1 capsule by mouth in the morning.    [provider]  glucose blood test strip daily. 11/09/20   [provider]  Lactobacillus (DIGESTIVE HEALTH PROBIOTIC PO) Take 2 capsules by mouth in the morning.    [provider]  levothyroxine (SYNTHROID) 75 MCG tablet Take 1 tablet (75 mcg total) by mouth daily with breakfast. 11/12/21   Libby Maw, MD  Multiple Vitamins-Minerals (WOMENS MULTI GUMMIES PO) Take 2 tablets by mouth daily.    [provider]  oxybutynin (DITROPAN XL) 15 MG 24 hr tablet TAKE ONE TABLET BY MOUTH AT BEDTIME 11/12/21   Libby Maw, MD  rosuvastatin (CRESTOR) 10 MG tablet TAKE ONE TABLET BY MOUTH DAILY 06/26/21   Libby Maw, MD  traMADol (ULTRAM) 50 MG tablet Take 1 tablet (50 mg total) by mouth every 6 (six) hours as needed. 04/20/21    Netta Cedars, MD  traZODone (DESYREL) 50 MG tablet Take 0.5 tablets (25 mg total) by mouth at bedtime. 06/25/21   Libby Maw, MD    Physical Exam    Vital Signs:  CARILYN WOOLSTON does not have vital signs available for review today.  Given telephonic nature of communication, physical exam is limited. AAOx3. NAD. Normal affect.  Speech and respirations are unlabored.  Accessory Clinical Findings    None  Assessment & Plan    1.  Preoperative Cardiovascular Risk Assessment: L3-4 anterior lateral lumbar fusion Dr. Earnie Larsson; [facility] neurosurgery and spine     Primary Cardiologist: Evalina Field, MD  Chart reviewed as part of pre-operative protocol coverage. Given past medical history and time since last visit, based on ACC/AHA guidelines, HERMELA HARDT would be at acceptable risk for the planned procedure without further cardiovascular testing.   Patient was advised that if she develops new symptoms prior to surgery to contact our office to arrange a follow-up appointment.  She verbalized understanding.  I will route this recommendation to the requesting party via Epic fax function and remove from pre-op pool.  Her RCRI is a class II risk, 0.9% risk of major cardiac event.  She is able to complete greater than 4 METS of physical activity.  Aspirin may be held for 7 days prior to her procedure.  Please resume as soon as hemostasis is achieved.    Time:   Today, I have spent 7 minutes with the patient with telehealth technology discussing medical history, symptoms, and management plan.     Deberah Pelton, NP  12/23/2021, 5:30 PM

## 2021-12-24 ENCOUNTER — Ambulatory Visit: Payer: Medicare PPO | Attending: Cardiology | Admitting: General Practice

## 2021-12-24 DIAGNOSIS — Z01810 Encounter for preprocedural cardiovascular examination: Secondary | ICD-10-CM

## 2022-01-02 DIAGNOSIS — Z48810 Encounter for surgical aftercare following surgery on the sense organs: Secondary | ICD-10-CM | POA: Diagnosis not present

## 2022-01-02 DIAGNOSIS — Z961 Presence of intraocular lens: Secondary | ICD-10-CM | POA: Diagnosis not present

## 2022-01-02 DIAGNOSIS — H11003 Unspecified pterygium of eye, bilateral: Secondary | ICD-10-CM | POA: Diagnosis not present

## 2022-01-02 IMAGING — MR MR CERVICAL SPINE W/O CM
5 series · 34 of 48 positions shown · non-contrast
Comparison: None available.

CLINICAL DATA: Follow-up examination for spinal cord injury,
history of prior fall on 10/09/2019. Neck pain and stiffness,
limited range of motion, balance issues.

EXAM:
MRI CERVICAL SPINE WITHOUT CONTRAST
TECHNIQUE: Multiplanar, multisequence MR imaging of the cervical spine was
performed. No intravenous contrast was administered.

[Series 2: T2 · sagittal · 3.0mm · 0.41mm/px · 8 of 18 slices shown (1 of 2)]
[im 1/18]
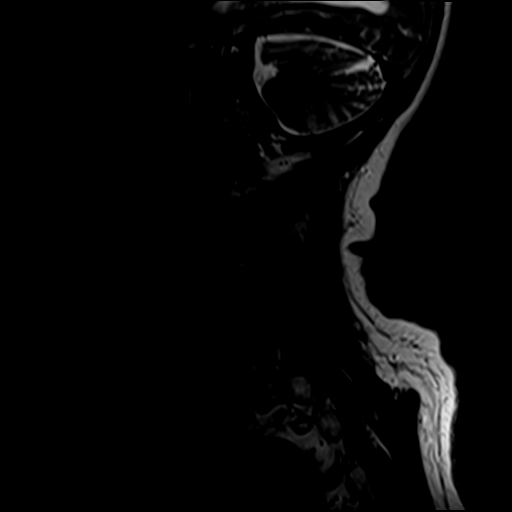
[im 3/18]
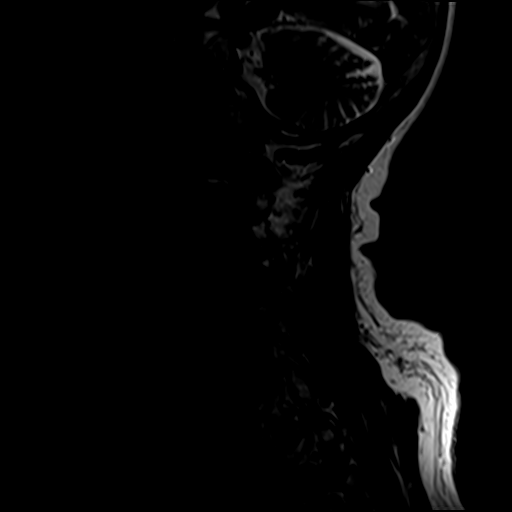
[im 5/18]
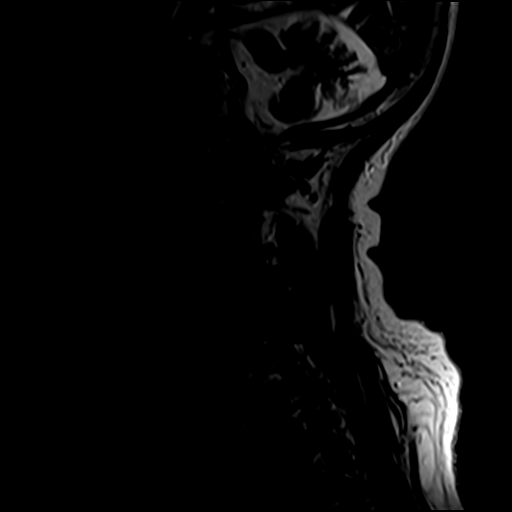
[im 8/18]
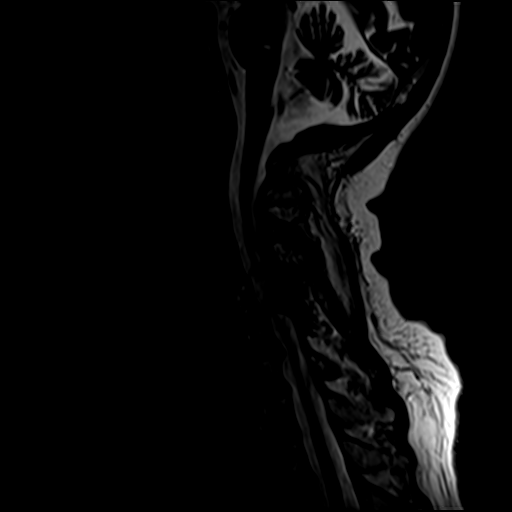
[im 10/18]
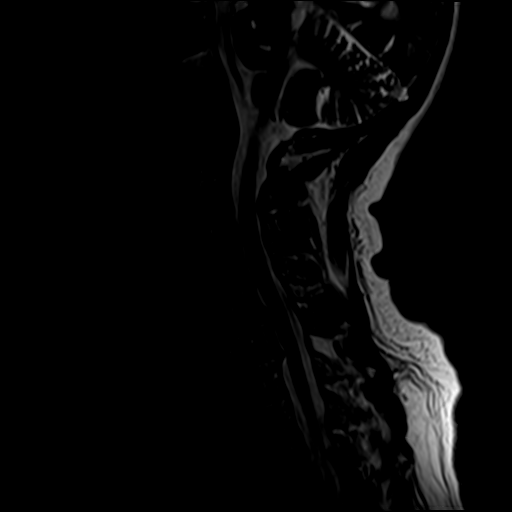
[im 13/18]
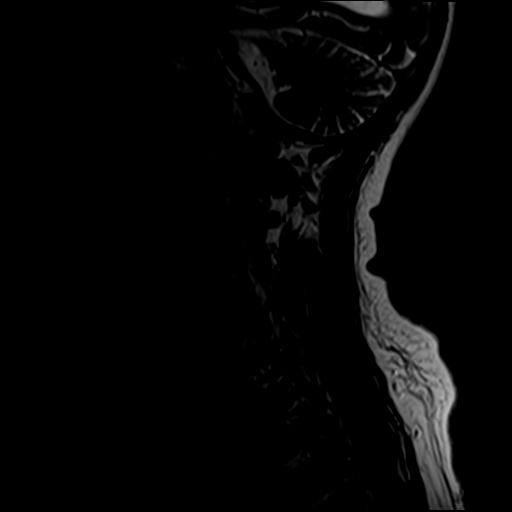
[im 15/18]
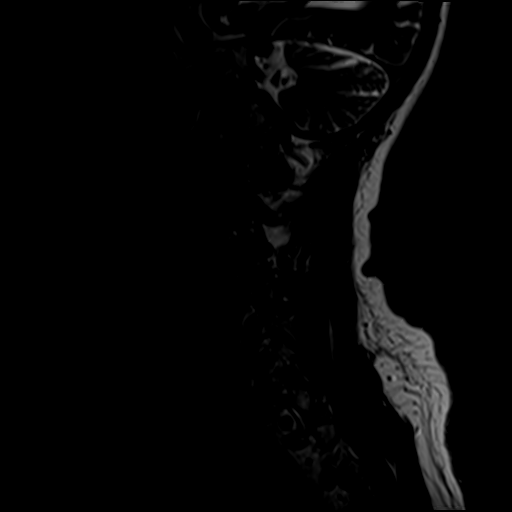
[im 18/18]
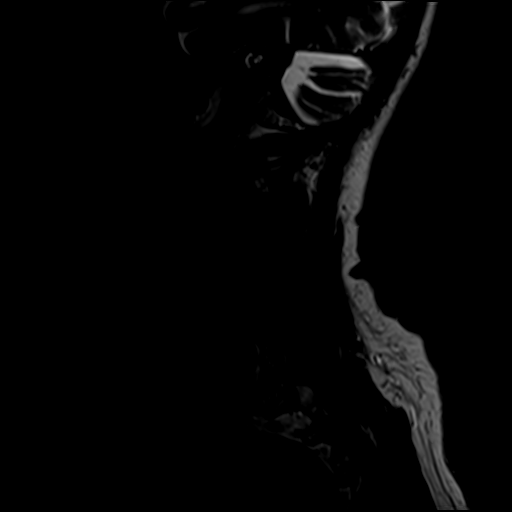

[Series 3: STIR · sagittal · 3.0mm · 0.82mm/px · 8 of 18 slices shown]
[im 1/18]
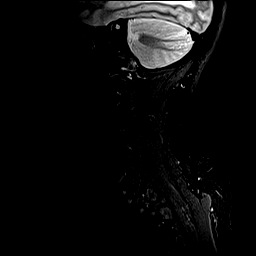
[im 3/18]
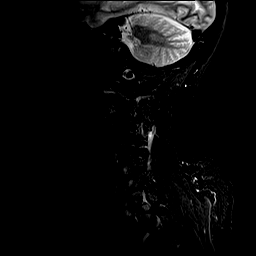
[im 5/18]
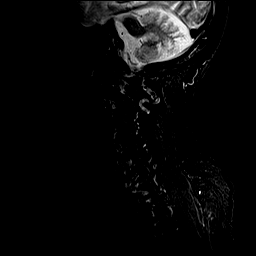
[im 8/18]
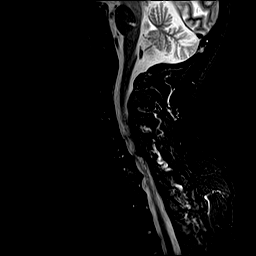
[im 10/18]
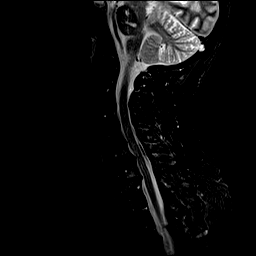
[im 13/18]
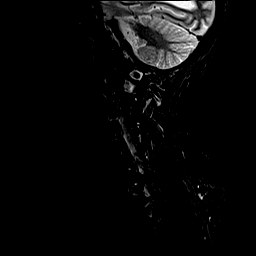
[im 15/18]
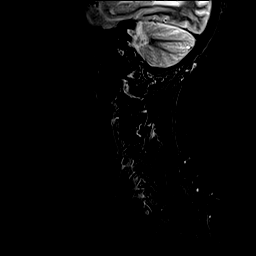
[im 18/18]
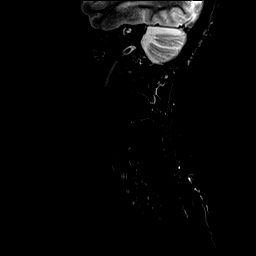

[Series 4: T1 · sagittal · 3.0mm · 0.82mm/px · 8 of 18 slices shown]
[im 1/18]
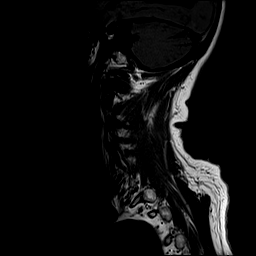
[im 3/18]
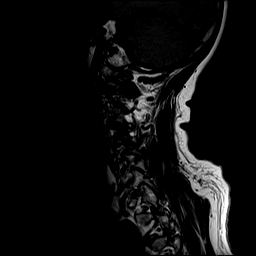
[im 5/18]
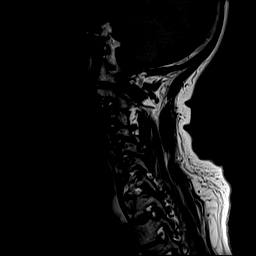
[im 8/18]
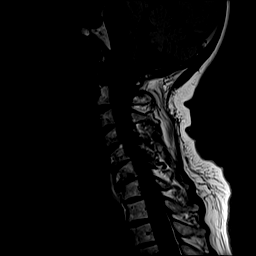
[im 10/18]
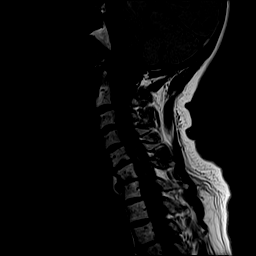
[im 13/18]
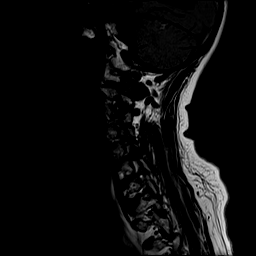
[im 15/18]
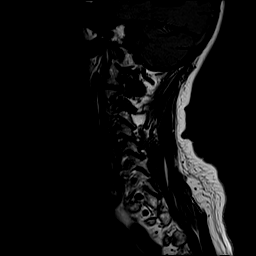
[im 18/18]
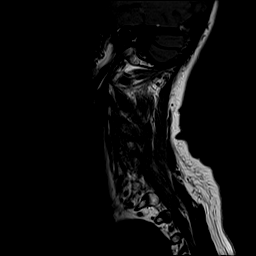

[Series 5: T2 · axial · 3.0mm · 0.70mm/px · z∈[-86,+1]mm · 9 of 25 slices shown (2 of 2)]
[im 1/25]
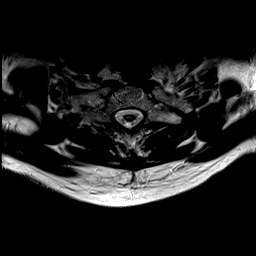
[im 5/25]
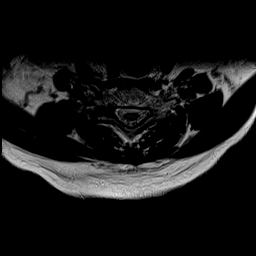
[im 7/25]
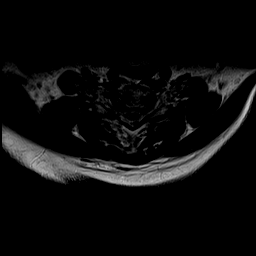
[im 11/25]
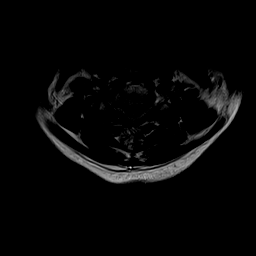
[im 14/25]
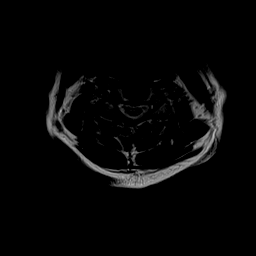
[im 18/25]
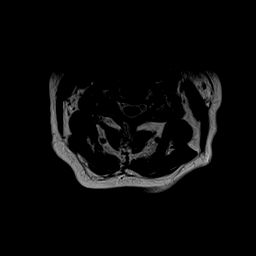
[im 20/25]
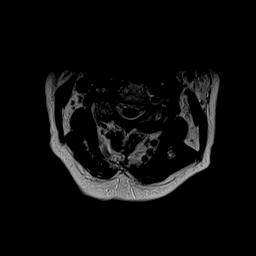
[im 22/25]
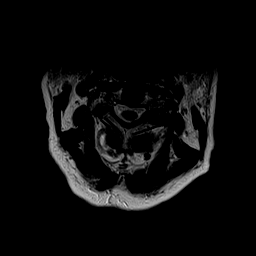
[im 25/25]
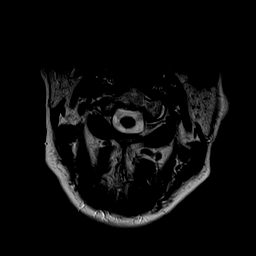

[Series 6: GRE · axial · 3.0mm · 0.35mm/px · 1 of 25 slices shown]
[im 1/25]
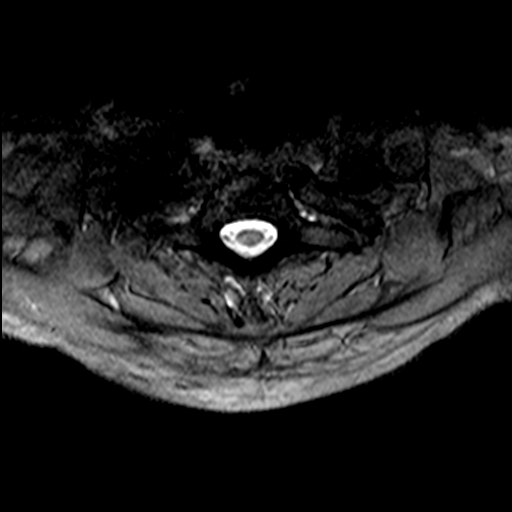

[34 of 48 positions shown; findings below may reference images not displayed]

FINDINGS: Alignment: Examination mildly degraded by motion artifact.

Straightening with slight reversal of the normal cervical lordosis,
with focal kyphotic angulation at C6. Trace anterolisthesis of C4 on
C5, C7 on T1, and T2 on T3, likely degenerative and facet mediated.

Vertebrae: Vertebral body height maintained without acute or chronic
fracture. Bone marrow signal intensity within normal limits. No
worrisome osseous lesions. Discogenic reactive endplate changes
present about the C6-7 interspace. No other abnormal marrow edema.

Cord: Signal intensity within the cervical spinal cord within normal
limits. No appreciable cord signal abnormality to suggest
myelomalacia or spinal cord injury.

Posterior Fossa, vertebral arteries, paraspinal tissues: Multiple
remote lacunar infarcts noted within the pons. Visualized brain
otherwise within normal limits. Craniocervical junction normal.
Paraspinous and prevertebral soft tissues within normal limits.
Normal flow voids seen within the vertebral arteries bilaterally.

Disc levels:

C2-C3: Small central disc protrusion indents the ventral thecal sac.
Moderate left-sided facet hypertrophy. No spinal stenosis. Mild left
C3 foraminal narrowing. No significant right foraminal stenosis.

C3-C4: Small central disc protrusion indents the ventral thecal sac.
Moderate left with mild right facet hypertrophy. Mild ligament
flavum thickening. No significant spinal stenosis. Moderate right C4
foraminal stenosis. No significant left foraminal narrowing.

C4-C5: Trace anterolisthesis. Mild disc bulge with uncovertebral
spurring. Moderate right with mild left facet and ligament flavum
hypertrophy. No spinal stenosis. Moderate right with mild left C5
foraminal narrowing.

C5-C6: Circumferential disc bulge with uncovertebral hypertrophy.
Mild-to-moderate right with mild left facet hypertrophy. Bulging
disc flattens and partially effaces the ventral thecal sac with
resultant mild spinal stenosis. No significant cord deformity. Mild
to moderate bilateral C6 foraminal narrowing.

C6-C7: Degenerative intervertebral disc space narrowing with diffuse
disc osteophyte complex. Broad posterior component flattens and
partially faces the ventral thecal sac resultant mild spinal
stenosis. No cord deformity. Mild left greater than right C7
foraminal narrowing.

C7-T1: Disc desiccation without significant disc bulge. Right-sided
uncovertebral spurring. Moderate right with mild left facet
hypertrophy. No significant spinal stenosis. Mild right C8 foraminal
narrowing. No significant left foraminal stenosis.

Visualized upper thoracic spinal cord intact without abnormality.
IMPRESSION: 1. Normal MRI appearance of the cervical spinal cord. No evidence
for myelomalacia or spinal cord injury.
2. Multilevel cervical spondylosis with resultant mild spinal
stenosis at C5-6 and C6-7.
3. Multifactorial degenerative changes with resultant mild to
moderate multilevel foraminal narrowing as above. Notable findings
include moderate right C4 and C5 foraminal stenosis, mild to
moderate bilateral C6 foraminal narrowing, with mild right C8
foraminal narrowing.
4. Multiple remote lacunar infarcts within the pons.

## 2022-01-08 ENCOUNTER — Ambulatory Visit: Payer: Medicare PPO | Attending: Nurse Practitioner | Admitting: Nurse Practitioner

## 2022-01-08 ENCOUNTER — Encounter: Payer: Self-pay | Admitting: Nurse Practitioner

## 2022-01-08 VITALS — BP 132/64 | HR 81 | Ht 63.0 in | Wt 140.0 lb

## 2022-01-08 DIAGNOSIS — I10 Essential (primary) hypertension: Secondary | ICD-10-CM

## 2022-01-08 DIAGNOSIS — R600 Localized edema: Secondary | ICD-10-CM

## 2022-01-08 DIAGNOSIS — E119 Type 2 diabetes mellitus without complications: Secondary | ICD-10-CM | POA: Diagnosis not present

## 2022-01-08 DIAGNOSIS — E785 Hyperlipidemia, unspecified: Secondary | ICD-10-CM | POA: Diagnosis not present

## 2022-01-08 DIAGNOSIS — Z952 Presence of prosthetic heart valve: Secondary | ICD-10-CM | POA: Diagnosis not present

## 2022-01-08 DIAGNOSIS — I350 Nonrheumatic aortic (valve) stenosis: Secondary | ICD-10-CM

## 2022-01-08 NOTE — Patient Instructions (Addendum)
Medication Instructions:  Your physician recommends that you continue on your current medications as directed. Please refer to the Current Medication list given to you today.   *If you need a refill on your cardiac medications before your next appointment, please call your pharmacy*   Lab Work: NONE ordered at this time of appointment   If you have labs (blood work) drawn today and your tests are completely normal, you will receive your results only by: [place] (if you have MyChart) OR A paper copy in the mail If you have any lab test that is abnormal or we need to change your treatment, we will call you to review the results.   Testing/Procedures: NONE ordered at this time of appointment     Follow-Up: At [facility], you and your health needs are our priority.  As part of our continuing mission to provide you with exceptional heart care, we have created designated Provider Care Teams.  These Care Teams include your primary Cardiologist (physician) and Advanced Practice Providers (APPs -  Physician Assistants and Nurse Practitioners) who all work together to provide you with the care you need, when you need it.  We recommend signing up for the patient portal called "MyChart".  Sign up information is provided on this After Visit Summary.  MyChart is used to connect with patients for Virtual Visits (Telemedicine).  Patients are able to view lab/test results, encounter notes, upcoming appointments, etc.  Non-urgent messages can be sent to your provider as well.   To learn more about what you can do with MyChart, go to [email or web address].    Your next appointment:    Follow up as scheduled   The format for your next appointment:   In Person  Provider:   Dr. Jenetta Downer' Nori Riis   Other Instructions   Important Information About Sugar

## 2022-01-08 NOTE — Progress Notes (Addendum)
Office Visit    Patient Name: Cheryl Cooper Date of Encounter: 01/08/2022  Primary Care Provider:  Libby Maw, MD Primary Cardiologist:  Evalina Field, MD  Chief Complaint    81 year old female with a history of aortic stenosis s/p TAVR 10/2021, hypertension, hyperlipidemia, type 2 diabetes, hypothyroidism, chronic back pain, and GERD who presents for follow-up related to aortic stenosis and right lower extremity edema.  Past Medical History    Past Medical History:  Diagnosis Date   Anxiety    Arthritis    Carpal tunnel syndrome    Deafness in left ear    Depression    Diabetes mellitus    diet controlled   Diverticulitis    GERD (gastroesophageal reflux disease)    Hypercholesteremia    Hypertension    Hypothyroid    Memory loss    reports d/t concussion   PONV (postoperative nausea and vomiting) yrs ago, none recent   Post concussion syndrome    S/P TAVR (transcatheter aortic valve replacement) 11/13/2021   s/p TAVR with a 23 mm Edwards S3UR via the TF approach by Dr. Burt Knack and Dr. Lavonna Monarch   Severe aortic stenosis    Past Surgical History:  Procedure Laterality Date   [place]  2010   lower   BIOPSY  01/12/2018   Procedure: BIOPSY;  Surgeon: Irving Copas., MD;  Location: [facility];  Service: Gastroenterology;;   Wilmon Pali RELEASE Right 2023   CHOLECYSTECTOMY     ESOPHAGOGASTRODUODENOSCOPY (EGD) WITH PROPOFOL N/A 01/12/2018   Procedure: ESOPHAGOGASTRODUODENOSCOPY (EGD) WITH PROPOFOL;  Surgeon: Irving Copas., MD;  Location: [facility];  Service: Gastroenterology;  Laterality: N/A;   EYE SURGERY Right    cataract   INTRAOPERATIVE TRANSTHORACIC ECHOCARDIOGRAM N/A 11/13/2021   Procedure: INTRAOPERATIVE TRANSTHORACIC ECHOCARDIOGRAM;  Surgeon: [name], MD;  Location: [place];  Service: Open Heart Surgery;  Laterality: N/A;   left elobow surgery  2003   left rotator cuff  2001    LUMBAR LAMINECTOMY/DECOMPRESSION MICRODISCECTOMY Left 12/23/2017   Procedure: Laminectomy for facet/synovial cyst - left - Lumbar three-Lumbar four;  Surgeon: Earnie Larsson, MD;  Location: [place];  Service: Neurosurgery;  Laterality: Left;   r foot surgery  1995   REVERSE SHOULDER ARTHROPLASTY Left 10/23/2018   Procedure: REVERSE TOTAL SHOULDER ARTHROPLASTY;  Surgeon: Netta Cedars, MD;  Location: [facility];  Service: Orthopedics;  Laterality: Left;  interscalene block   REVERSE SHOULDER ARTHROPLASTY Right 04/20/2021   Procedure: REVERSE SHOULDER ARTHROPLASTY;  Surgeon: Netta Cedars, MD;  Location: [facility];  Service: Orthopedics;  Laterality: Right;  with ISB   right hand surgery  2001   RIGHT HEART CATH AND CORONARY ANGIOGRAPHY N/A 10/24/2021   Procedure: RIGHT HEART CATH AND CORONARY ANGIOGRAPHY;  Surgeon: [name], MD;  Location: [facility];  Service: Cardiovascular;  Laterality: N/A;   right index finger surgery  2009   SHOULDER OPEN ROTATOR CUFF REPAIR  11/21/2011   Procedure: ROTATOR CUFF REPAIR SHOULDER OPEN;  Surgeon: Johnn Hai, MD;  Location: [facility];  Service: Orthopedics;  Laterality: Right;  with subacromial decompression   SHOULDER OPEN ROTATOR CUFF REPAIR Right 05/09/2016   Procedure: Right shoulder mini open revision rotator cuff repair, subacromial decompression;  Surgeon: Susa Day, MD;  Location: [facility];  Service: Orthopedics;  Laterality: Right;   TOTAL HIP ARTHROPLASTY Right 04/24/2017   Procedure: RIGHT TOTAL HIP ARTHROPLASTY ANTERIOR APPROACH;  Surgeon: Rod Can, MD;  Location:  [facility];  Service: Orthopedics;  Laterality: Right;  Needs RNFA   TRANSCATHETER AORTIC VALVE REPLACEMENT, TRANSFEMORAL N/A 11/13/2021   Procedure: Transcatheter Aortic Valve Replacement, Transfemoral Edwards 23 MM SAPIEN 3 Ultra;  Surgeon: [name], MD;  Location: [place];  Service: Open Heart Surgery;  Laterality: N/A;  Transfemoral approach    Allergies  Allergies   Allergen Reactions   Other Other (See Comments) and Rash    Tomato sauce, garlic, onion - severe acid reflux    Flexeril [Cyclobenzaprine] Other (See Comments)    Per spouse "she felt like a zombie"   Chlordiazepoxide-Clidinium Other (See Comments)    Dizziness (intolerance)   Codeine Nausea Only and Rash   Lipitor [Atorvastatin] Other (See Comments)    Unbalanced    History of Present Illness    81 year old female with the above past medical history of aortic stenosis s/p TAVR 10/2021, hypertension, hyperlipidemia, type 2 diabetes, hypothyroidism, chronic back pain, and GERD.  Echocardiogram in 09/2021 showed EF 70%, very severe aortic stenosis with mean gradient of 69 mmHg, peak gradient 93.7 mmHg.  She reported progressive symptoms of exertional dyspnea as well as fatigue and intermittent dizziness. Cardiac catheterization in 10/2021 showed patent coronary arteries with moderate ostial diagonal stenosis, no other significant coronary artery disease.  She underwent successful TAVR procedure with a 23 mm Edwards SAPIEN 3 ultra [place] valve on 11/13/2021.  Postop echo showed EF 70%, normally functioning TAVR with a mean gradient of 11 mmHg, trivial PVL.  She was last seen in the office on 12/12/2021 and was stable from a cardiac standpoint.  Contacted our office on 12/20/2018 reported a 2 to 3-week history of left leg swelling in her thigh and calf area.  Lower extremity venous duplex was negative for DVT.  She was seen virtually on 12/24/2021 and was cleared for back surgery.    She presents today for follow-up. Since her last visit she has been stable from a cardiac standpoint.  She does note ongoing intermittent dependent right thigh nonpitting edema.  She notices at night when she goes to bed and states it is resolved by the time she wakes up in the morning.  She denies any redness, pain.  She denies any chest pain, dyspnea, palpitations, additional edema.  She has been somewhat sedentary and her  activity has been limited in the setting of severe back pain.  She is very eager to proceed with her upcoming back surgery.  Other than her right thigh edema, she denies any additional concerns today.  Home Medications    Current Outpatient Medications  Medication Sig Dispense Refill   amLODipine (NORVASC) 5 MG tablet TAKE 1 AND 1/2 TABLET BY MOUTH DAILY 135 tablet 1   amoxicillin (AMOXIL) 500 MG tablet Take 500 mg by mouth as directed. As prescribed by dentist     aspirin 81 MG chewable tablet Chew 1 tablet (81 mg total) by mouth daily.     Calcium Carbonate-Vitamin D (CALCIUM-D PO) Take 2 tablets by mouth daily. CHEWABLES     Cholecalciferol 50 MCG (2000 UT) TABS Take 2,000 Units by mouth in the morning.     Cyanocobalamin (B-12 PO) Take 1 tablet by mouth daily.     escitalopram (LEXAPRO) 20 MG tablet Take 1 tablet (20 mg total) by mouth daily. 90 tablet 0   esomeprazole (NEXIUM) 40 MG capsule Take 1 capsule (40 mg total) by mouth 2 (two) times daily before a meal. 60 capsule 0   ezetimibe (ZETIA) 10 MG  tablet TAKE ONE TABLET BY MOUTH DAILY 90 tablet 2   ferrous sulfate 325 (65 FE) MG tablet Take 325 mg by mouth daily with breakfast.     FIBER ADULT GUMMIES PO Take 2 tablets by mouth daily.     GINKGO BILOBA PO Take 1 capsule by mouth in the morning.     glucose blood test strip daily.     Lactobacillus (DIGESTIVE HEALTH PROBIOTIC PO) Take 2 capsules by mouth in the morning.     levothyroxine (SYNTHROID) 75 MCG tablet Take 1 tablet (75 mcg total) by mouth daily with breakfast. 90 tablet 2   Multiple Vitamins-Minerals (WOMENS MULTI GUMMIES PO) Take 2 tablets by mouth daily.     oxybutynin (DITROPAN XL) 15 MG 24 hr tablet TAKE ONE TABLET BY MOUTH AT BEDTIME 90 tablet 1   rosuvastatin (CRESTOR) 10 MG tablet TAKE ONE TABLET BY MOUTH DAILY 90 tablet 3   traMADol (ULTRAM) 50 MG tablet Take 1 tablet (50 mg total) by mouth every 6 (six) hours as needed. 30 tablet 0   traZODone (DESYREL) 50 MG  tablet Take 0.5 tablets (25 mg total) by mouth at bedtime. 45 tablet 1   No current facility-administered medications for this visit.     Review of Systems    She denies chest pain, palpitations, dyspnea, pnd, orthopnea, n, v, dizziness, syncope, weight gain, or early satiety. All other systems reviewed and are otherwise negative except as noted above.   Physical Exam    VS:  BP 132/64   Pulse 81   Ht '5\' 3"'$  (1.6 m)   Wt 140 lb (63.5 kg)   SpO2 94%   BMI 24.80 kg/m   GEN: Well nourished, well developed, in no acute distress. HEENT: normal. Neck: Supple, no JVD, carotid bruits, or masses. Cardiac: RRR, no murmurs, rubs, or gallops. No clubbing, cyanosis, edema.  Radials/DP/PT 2+ and equal bilaterally.  Respiratory:  Respirations regular and unlabored, clear to auscultation bilaterally. GI: Soft, nontender, nondistended, BS + x 4. MS: no deformity or atrophy. Skin: warm and dry, no rash. Neuro:  Strength and sensation are intact. Psych: Normal affect.  Accessory Clinical Findings    ECG personally reviewed by me today - No EKG in office today.     Lab Results  Component Value Date   WBC 13.4 (H) 11/14/2021   HGB 12.5 11/14/2021   HCT 36.7 11/14/2021   MCV 88.4 11/14/2021   PLT 133 (L) 11/14/2021   Lab Results  Component Value Date   CREATININE 0.88 11/14/2021   BUN 8 11/14/2021   NA 137 11/14/2021   K 3.8 11/14/2021   CL 104 11/14/2021   CO2 25 11/14/2021   Lab Results  Component Value Date   ALT 37 11/09/2021   AST 38 11/09/2021   ALKPHOS 49 11/09/2021   BILITOT 0.6 11/09/2021   Lab Results  Component Value Date   CHOL 150 07/28/2020   HDL 47.50 07/28/2020   LDLCALC 63 07/28/2020   LDLDIRECT 53.0 03/20/2021   TRIG 193.0 (H) 07/28/2020   CHOLHDL 3 07/28/2020    Lab Results  Component Value Date   HGBA1C 6.5 (H) 03/28/2021    Assessment & Plan    1. Right lower extremity edema: Intermittent for approximately 1 month.  She describes dependent edema  in her right thigh that is most noticeable when she goes to bed at night.  It resolves by the time she wakes up in the morning.  She denies any associated pain,  redness.  Denies chest pain, dyspnea.  Lower extremity venous duplex was negative for DVT on 12/21/2021.  Her symptoms have improved over the past 2 days.  Exam unremarkable today.  Overall stable.  I do not think any additional testing is warranted at this time.  Continue to monitor.  2. Aortic stenosis: S/p TAVR in 11/13/2021.  Postop echo showed EF 70%, normally functioning TAVR with a mean gradient of 11 mmHg, trivial PVL. Euvolemic and well compensated on exam.  Follow-up echo recommended in 1 year.  3. Hypertension: BP well controlled. Continue current antihypertensive regimen.   4. Hyperlipidemia: LDL was 63 in 07/2020.  Continue aspirin, Crestor, Zetia.  5. Type 2 diabetes: A1c was 6.5 in 03/2021. Monitored and managed per PCP.  6. Disposition: Follow-up as scheduled with Dr. Audie Box in February 2024.     Lenna Sciara, NP 01/08/2022, 9:24 AM

## 2022-01-09 ENCOUNTER — Other Ambulatory Visit: Payer: Self-pay | Admitting: Neurosurgery

## 2022-01-16 DIAGNOSIS — M4805 Spinal stenosis, thoracolumbar region: Secondary | ICD-10-CM | POA: Diagnosis not present

## 2022-01-21 NOTE — Pre-Procedure Instructions (Signed)
Surgical Instructions    Your procedure is scheduled on Friday, December 8th.  Report to [facility] Main Entrance "A" at 6:00 A.M., then check in with the Admitting office.  Call this number if you have problems the morning of surgery:  [phone]   If you have any questions prior to your surgery date call [phone]: Open Monday-Friday 8am-4pm If you experience any cold or flu symptoms such as cough, fever, chills, shortness of breath, etc. between now and your scheduled surgery, please notify us at the above number     Remember:  Do not eat or drink after midnight the night before your surgery     Take these medicines the morning of surgery with A SIP OF WATER:  amLODipine (NORVASC)  escitalopram (LEXAPRO)  esomeprazole (NEXIUM)  ezetimibe (ZETIA)  levothyroxine (SYNTHROID)  rosuvastatin (CRESTOR)    Take these medications as needed: HYDROcodone-acetaminophen (NORCO/VICODIN)  traMADol (ULTRAM)   Follow your surgeon's instructions on when to stop Aspirin.  If no instructions were given by your surgeon then you will need to call the office to get those instructions.    As of today, STOP taking any Aleve, Naproxen, Ibuprofen, Motrin, Advil, Goody's, BC's, all herbal medications, fish oil, and all vitamins.  HOW TO MANAGE YOUR DIABETES BEFORE AND AFTER SURGERY  Why is it important to control my blood sugar before and after surgery? Improving blood sugar levels before and after surgery helps healing and can limit problems. A way of improving blood sugar control is eating a healthy diet by:  Eating less sugar and carbohydrates  Increasing activity/exercise  Talking with your doctor about reaching your blood sugar goals High blood sugars (greater than 180 mg/dL) can raise your risk of infections and slow your recovery, so you will need to focus on controlling your diabetes during the weeks before surgery. Make sure that the doctor who takes care of your diabetes knows about  your planned surgery including the date and location.  How do I manage my blood sugar before surgery? Check your blood sugar at least 4 times a day, starting 2 days before surgery, to make sure that the level is not too high or low.  Check your blood sugar the morning of your surgery when you wake up and every 2 hours until you get to the Short Stay unit.  If your blood sugar is less than 70 mg/dL, you will need to treat for low blood sugar: Do not take insulin. Treat a low blood sugar (less than 70 mg/dL) with  cup of clear juice (cranberry or apple), 4 glucose tablets, OR glucose gel. Recheck blood sugar in 15 minutes after treatment (to make sure it is greater than 70 mg/dL). If your blood sugar is not greater than 70 mg/dL on recheck, call [phone] for further instructions. Report your blood sugar to the short stay nurse when you get to Short Stay.  If you are admitted to the hospital after surgery: Your blood sugar will be checked by the staff and you will probably be given insulin after surgery (instead of oral diabetes medicines) to make sure you have good blood sugar levels. The goal for blood sugar control after surgery is 80-180 mg/dL.            Do not wear jewelry or makeup. Do not wear lotions, powders, perfumes or deodorant. Men may shave face and neck. Do not bring valuables to the hospital.  [facility] is not responsible for any belongings or valuables.  Do NOT Smoke (Tobacco/Vaping)  24 hours prior to your procedure  If you use a CPAP at night, you may bring your mask for your overnight stay.   Contacts, glasses, hearing aids, dentures or partials may not be worn into surgery, please bring cases for these belongings   For patients admitted to the hospital, discharge time will be determined by your treatment team.   Patients discharged the day of surgery will not be allowed to drive home, and someone needs to stay with them for 24 hours.   SURGICAL WAITING  ROOM VISITATION Patients having surgery or a procedure may have no more than 2 support people in the waiting area - these visitors may rotate.   Children under the age of 38 must have an adult with them who is not the patient. If the patient needs to stay at the hospital during part of their recovery, the visitor guidelines for inpatient rooms apply. Pre-op nurse will coordinate an appropriate time for 1 support person to accompany patient in pre-op.  This support person may not rotate.   Please refer to [email or web address].hu for the visitor guidelines for Inpatients (after your surgery is over and you are in a regular room).    Special instructions:    Oral Hygiene is also important to reduce your risk of infection.  Remember - BRUSH YOUR TEETH THE MORNING OF SURGERY WITH YOUR REGULAR TOOTHPASTE   [place]- Preparing For Surgery  Before surgery, you can play an important role. Because skin is not sterile, your skin needs to be as free of germs as possible. You can reduce the number of germs on your skin by washing with CHG (chlorahexidine gluconate) Soap before surgery.  CHG is an antiseptic cleaner which kills germs and bonds with the skin to continue killing germs even after washing.     Please do not use if you have an allergy to CHG or antibacterial soaps. If your skin becomes reddened/irritated stop using the CHG.  Do not shave (including legs and underarms) for at least 48 hours prior to first CHG shower. It is OK to shave your face.  Please follow these instructions carefully.     Shower the NIGHT BEFORE SURGERY and the MORNING OF SURGERY with CHG Soap.   If you chose to wash your hair, wash your hair first as usual with your normal shampoo. After you shampoo, rinse your hair and body thoroughly to remove the shampoo.  Then [employer] and genitals (private parts) with your normal soap and rinse thoroughly to remove soap.  After  that Use CHG Soap as you would any other liquid soap. You can apply CHG directly to the skin and wash gently with a scrungie or a clean washcloth.   Apply the CHG Soap to your body ONLY FROM THE NECK DOWN.  Do not use on open wounds or open sores. Avoid contact with your eyes, ears, mouth and genitals (private parts). Wash Face and genitals (private parts)  with your normal soap.   Wash thoroughly, paying special attention to the area where your surgery will be performed.  Thoroughly rinse your body with warm water from the neck down.  DO NOT shower/wash with your normal soap after using and rinsing off the CHG Soap.  Pat yourself dry with a CLEAN TOWEL.  Wear CLEAN PAJAMAS to bed the night before surgery  Place CLEAN SHEETS on your bed the night before your surgery  DO NOT SLEEP WITH PETS.   Day of  Surgery:  Take a shower with CHG soap. Wear Clean/Comfortable clothing the morning of surgery Do not apply any deodorants/lotions.   Remember to brush your teeth WITH YOUR REGULAR TOOTHPASTE.    If you received a COVID test during your pre-op visit, it is requested that you wear a mask when out in public, stay away from anyone that may not be feeling well, and notify your surgeon if you develop symptoms. If you have been in contact with anyone that has tested positive in the last 10 days, please notify your surgeon.    Please read over the following fact sheets that you were given.

## 2022-01-22 ENCOUNTER — Encounter (HOSPITAL_COMMUNITY)
Admission: RE | Admit: 2022-01-22 | Discharge: 2022-01-22 | Disposition: A | Discharge status: Home / Self Care | Payer: Medicare PPO | Source: Ambulatory Visit | Attending: Neurosurgery | Admitting: Neurosurgery

## 2022-01-22 ENCOUNTER — Other Ambulatory Visit: Payer: Self-pay

## 2022-01-22 ENCOUNTER — Encounter (HOSPITAL_COMMUNITY): Payer: Self-pay

## 2022-01-22 VITALS — BP 140/67 | HR 62 | Temp 97.5°F | Resp 18 | Ht 63.0 in | Wt 142.5 lb

## 2022-01-22 DIAGNOSIS — Z96612 Presence of left artificial shoulder joint: Secondary | ICD-10-CM | POA: Diagnosis present

## 2022-01-22 DIAGNOSIS — I119 Hypertensive heart disease without heart failure: Secondary | ICD-10-CM | POA: Insufficient documentation

## 2022-01-22 DIAGNOSIS — E7800 Pure hypercholesterolemia, unspecified: Secondary | ICD-10-CM | POA: Diagnosis present

## 2022-01-22 DIAGNOSIS — Z79899 Other long term (current) drug therapy: Secondary | ICD-10-CM | POA: Diagnosis not present

## 2022-01-22 DIAGNOSIS — K219 Gastro-esophageal reflux disease without esophagitis: Secondary | ICD-10-CM | POA: Insufficient documentation

## 2022-01-22 DIAGNOSIS — M5416 Radiculopathy, lumbar region: Secondary | ICD-10-CM | POA: Diagnosis not present

## 2022-01-22 DIAGNOSIS — Z90710 Acquired absence of both cervix and uterus: Secondary | ICD-10-CM | POA: Diagnosis not present

## 2022-01-22 DIAGNOSIS — Z9049 Acquired absence of other specified parts of digestive tract: Secondary | ICD-10-CM | POA: Diagnosis not present

## 2022-01-22 DIAGNOSIS — M4316 Spondylolisthesis, lumbar region: Secondary | ICD-10-CM | POA: Diagnosis not present

## 2022-01-22 DIAGNOSIS — M5116 Intervertebral disc disorders with radiculopathy, lumbar region: Secondary | ICD-10-CM | POA: Diagnosis present

## 2022-01-22 DIAGNOSIS — Z823 Family history of stroke: Secondary | ICD-10-CM | POA: Diagnosis not present

## 2022-01-22 DIAGNOSIS — F418 Other specified anxiety disorders: Secondary | ICD-10-CM | POA: Diagnosis not present

## 2022-01-22 DIAGNOSIS — Z01818 Encounter for other preprocedural examination: Secondary | ICD-10-CM

## 2022-01-22 DIAGNOSIS — Z888 Allergy status to other drugs, medicaments and biological substances status: Secondary | ICD-10-CM | POA: Diagnosis not present

## 2022-01-22 DIAGNOSIS — Z96611 Presence of right artificial shoulder joint: Secondary | ICD-10-CM | POA: Diagnosis present

## 2022-01-22 DIAGNOSIS — E1136 Type 2 diabetes mellitus with diabetic cataract: Secondary | ICD-10-CM | POA: Insufficient documentation

## 2022-01-22 DIAGNOSIS — Z833 Family history of diabetes mellitus: Secondary | ICD-10-CM | POA: Diagnosis not present

## 2022-01-22 DIAGNOSIS — Z8249 Family history of ischemic heart disease and other diseases of the circulatory system: Secondary | ICD-10-CM | POA: Diagnosis not present

## 2022-01-22 DIAGNOSIS — Z01812 Encounter for preprocedural laboratory examination: Secondary | ICD-10-CM | POA: Diagnosis not present

## 2022-01-22 DIAGNOSIS — E039 Hypothyroidism, unspecified: Secondary | ICD-10-CM | POA: Diagnosis present

## 2022-01-22 DIAGNOSIS — Z7982 Long term (current) use of aspirin: Secondary | ICD-10-CM | POA: Diagnosis not present

## 2022-01-22 DIAGNOSIS — M48061 Spinal stenosis, lumbar region without neurogenic claudication: Secondary | ICD-10-CM | POA: Diagnosis present

## 2022-01-22 DIAGNOSIS — Z952 Presence of prosthetic heart valve: Secondary | ICD-10-CM | POA: Diagnosis not present

## 2022-01-22 DIAGNOSIS — H9192 Unspecified hearing loss, left ear: Secondary | ICD-10-CM | POA: Diagnosis present

## 2022-01-22 DIAGNOSIS — I2510 Atherosclerotic heart disease of native coronary artery without angina pectoris: Secondary | ICD-10-CM | POA: Diagnosis present

## 2022-01-22 DIAGNOSIS — M4310 Spondylolisthesis, site unspecified: Secondary | ICD-10-CM | POA: Diagnosis present

## 2022-01-22 DIAGNOSIS — M4326 Fusion of spine, lumbar region: Secondary | ICD-10-CM | POA: Diagnosis not present

## 2022-01-22 DIAGNOSIS — Z96641 Presence of right artificial hip joint: Secondary | ICD-10-CM | POA: Diagnosis present

## 2022-01-22 DIAGNOSIS — I10 Essential (primary) hypertension: Secondary | ICD-10-CM | POA: Diagnosis present

## 2022-01-22 DIAGNOSIS — E119 Type 2 diabetes mellitus without complications: Secondary | ICD-10-CM | POA: Diagnosis present

## 2022-01-22 DIAGNOSIS — Z79890 Hormone replacement therapy (postmenopausal): Secondary | ICD-10-CM | POA: Diagnosis not present

## 2022-01-22 DIAGNOSIS — E118 Type 2 diabetes mellitus with unspecified complications: Secondary | ICD-10-CM | POA: Diagnosis present

## 2022-01-22 LAB — CBC
HCT: 41 % (ref 36.0–46.0)
Hemoglobin: 13.9 g/dL (ref 12.0–15.0)
MCH: 30.2 pg (ref 26.0–34.0)
MCHC: 33.9 g/dL (ref 30.0–36.0)
MCV: 88.9 fL (ref 80.0–100.0)
Platelets: 153 10*3/uL (ref 150–400)
RBC: 4.61 MIL/uL (ref 3.87–5.11)
RDW: 13.2 % (ref 11.5–15.5)
WBC: 6.9 10*3/uL (ref 4.0–10.5)
nRBC: 0 % (ref 0.0–0.2)

## 2022-01-22 LAB — BASIC METABOLIC PANEL
Anion gap: 6 (ref 5–15)
BUN: 11 mg/dL (ref 8–23)
CO2: 28 mmol/L (ref 22–32)
Calcium: 9.4 mg/dL (ref 8.9–10.3)
Chloride: 104 mmol/L (ref 98–111)
Creatinine, Ser: 0.97 mg/dL (ref 0.44–1.00)
GFR, Estimated: 59 mL/min — ABNORMAL LOW (ref >60)
Glucose, Bld: 109 mg/dL — ABNORMAL HIGH (ref 70–99)
Potassium: 3.9 mmol/L (ref 3.5–5.1)
Sodium: 138 mmol/L (ref 135–145)

## 2022-01-22 LAB — SURGICAL PCR SCREEN
MRSA, PCR: NEGATIVE
Staphylococcus aureus: NEGATIVE

## 2022-01-22 LAB — TYPE AND SCREEN
ABO/RH(D): A POS
Antibody Screen: NEGATIVE

## 2022-01-22 LAB — GLUCOSE, CAPILLARY: Glucose-Capillary: 154 mg/dL — ABNORMAL HIGH (ref 70–99)

## 2022-01-22 NOTE — Progress Notes (Addendum)
PCP - Libby Maw MD Cardiologist - Eleonore Chiquito MD  PPM/ICD - denies Device Orders -  Rep Notified -   Chest x-ray - 11/12/21 EKG - 11/21/21 Stress Test -  ECHO - 12/12/21 Cardiac Cath - 10/24/21  Sleep Study - none CPAP -   Fasting Blood Sugar - 115 Checks Blood Sugar once a day  Last dose of GLP1 agonist-  no GLP1 instructions:   Blood Thinner Instructions:na Aspirin Instructions:pt states her last dose of aspirin was 12/3 per her surgeon's instructions.   ERAS Protcol - PRE-SURGERY Ensure or G2-   COVID TEST- na   Anesthesia review: yes- cardiac history -TAVR-11/13/21  Patient denies shortness of breath, fever, cough and chest pain at PAT appointment   All instructions explained to the patient, with a verbal understanding of the material. Patient agrees to go over the instructions while at home for a better understanding. Patient also instructed to wear a mask when out in public prior to surgery. The opportunity to ask questions was provided.

## 2022-01-23 LAB — HEMOGLOBIN A1C
Hgb A1c MFr Bld: 6.4 % — ABNORMAL HIGH (ref 4.8–5.6)
Mean Plasma Glucose: 137 mg/dL

## 2022-01-23 NOTE — Progress Notes (Signed)
Anesthesia Chart Review:  Case: [id number] Date/Time: 01/25/22 0745   Procedure: ANTERIOR LATERAL LUMBAR FUSION WITH PERCUTANEOUS SCREW 1 LEVEL (Left)   Anesthesia type: General   Pre-op diagnosis: Stenosis   Location: [place] [facility] ROOM 19 / [place] [facility]   Surgeons: Earnie Larsson, MD       DISCUSSION:  Preoperative cardiology input outlined by Coletta Memos, NP on 12/24/21: "Given past medical history and time since last visit, based on ACC/AHA guidelines, Cheryl Cooper would be at acceptable risk for the planned procedure without further cardiovascular testing.    Patient was advised that if she develops new symptoms prior to surgery to contact our office to arrange a follow-up appointment.  She verbalized understanding.   I will route this recommendation to the requesting party via Epic fax function and remove from pre-op pool.   Her RCRI is a class II risk, 0.9% risk of major cardiac event.  She is able to complete greater than 4 METS of physical activity.   Aspirin may be held for 7 days prior to her procedure.  Please resume as soon as hemostasis is achieved."  Since then she had routine follow-up with Diona Browner, NP on 01/08/22 for intermittent edema in her right thigh at night and resolved by the morning. No chest pain or dyspnea. LE venous Duplex negative for DVT on 12/21/21. Symptoms improved, so no additional testing recommended. She noted plans for surgery.     VS: BP (!) 140/67   Pulse 62   Temp (!) 36.4 C (Oral)   Resp 18   Ht '5\' 3"'$  (1.6 m)   Wt 64.6 kg   SpO2 95%   BMI 25.24 kg/m   PROVIDERS: Libby Maw, MD   LABS: Labs reviewed: Acceptable for surgery. (all labs ordered are listed, but only abnormal results are displayed)  Labs Reviewed  GLUCOSE, CAPILLARY - Abnormal; Notable for the following components:      Result Value   Glucose-Capillary 154 (*)    All other components within normal limits  BASIC METABOLIC PANEL - Abnormal; Notable for the following  components:   Glucose, Bld 109 (*)    GFR, Estimated 59 (*)    All other components within normal limits  HEMOGLOBIN A1C - Abnormal; Notable for the following components:   Hgb A1c MFr Bld 6.4 (*)    All other components within normal limits  SURGICAL PCR SCREEN  CBC  TYPE AND SCREEN     IMAGES:   EKG: 11/21/21: Sinus bradycardia at 56 bpm. First degree AV block. LAD. Inferior infarct, age undetermined. Cannot rule out anterior infarct (age undetermined).   CV:  Past Medical History:  Diagnosis Date   Anxiety    Arthritis    Carpal tunnel syndrome    Deafness in left ear    Depression    Diabetes mellitus    diet controlled   Diverticulitis    GERD (gastroesophageal reflux disease)    Hypercholesteremia    Hypertension    Hypothyroid    Memory loss    reports d/t concussion   PONV (postoperative nausea and vomiting) yrs ago, none recent   Post concussion syndrome    S/P TAVR (transcatheter aortic valve replacement) 11/13/2021   s/p TAVR with a 23 mm Edwards S3UR via the TF approach by Dr. Burt Knack and Dr. Lavonna Monarch   Severe aortic stenosis     Past Surgical History:  Procedure Laterality Date   [place]  2010   lower   BIOPSY  01/12/2018   Procedure: BIOPSY;  Surgeon: Rush Landmark Telford Nab., MD;  Location: [facility];  Service: Gastroenterology;;   Wilmon Pali RELEASE Right 2023   CHOLECYSTECTOMY     ESOPHAGOGASTRODUODENOSCOPY (EGD) WITH PROPOFOL N/A 01/12/2018   Procedure: ESOPHAGOGASTRODUODENOSCOPY (EGD) WITH PROPOFOL;  Surgeon: Irving Copas., MD;  Location: [facility];  Service: Gastroenterology;  Laterality: N/A;   EYE SURGERY Right    cataract   INTRAOPERATIVE TRANSTHORACIC ECHOCARDIOGRAM N/A 11/13/2021   Procedure: INTRAOPERATIVE TRANSTHORACIC ECHOCARDIOGRAM;  Surgeon: [name], MD;  Location: [place];  Service: Open Heart Surgery;  Laterality: N/A;   left elobow surgery  2003   left rotator cuff  2001    LUMBAR LAMINECTOMY/DECOMPRESSION MICRODISCECTOMY Left 12/23/2017   Procedure: Laminectomy for facet/synovial cyst - left - Lumbar three-Lumbar four;  Surgeon: Earnie Larsson, MD;  Location: [place];  Service: Neurosurgery;  Laterality: Left;   r foot surgery  1995   REVERSE SHOULDER ARTHROPLASTY Left 10/23/2018   Procedure: REVERSE TOTAL SHOULDER ARTHROPLASTY;  Surgeon: Netta Cedars, MD;  Location: [facility];  Service: Orthopedics;  Laterality: Left;  interscalene block   REVERSE SHOULDER ARTHROPLASTY Right 04/20/2021   Procedure: REVERSE SHOULDER ARTHROPLASTY;  Surgeon: Netta Cedars, MD;  Location: [facility];  Service: Orthopedics;  Laterality: Right;  with ISB   right hand surgery  2001   RIGHT HEART CATH AND CORONARY ANGIOGRAPHY N/A 10/24/2021   Procedure: RIGHT HEART CATH AND CORONARY ANGIOGRAPHY;  Surgeon: [name], MD;  Location: [place] [facility];  Service: Cardiovascular;  Laterality: N/A;   right index finger surgery  2009   SHOULDER OPEN ROTATOR CUFF REPAIR  11/21/2011   Procedure: ROTATOR CUFF REPAIR SHOULDER OPEN;  Surgeon: Johnn Hai, MD;  Location: [facility];  Service: Orthopedics;  Laterality: Right;  with subacromial decompression   SHOULDER OPEN ROTATOR CUFF REPAIR Right 05/09/2016   Procedure: Right shoulder mini open revision rotator cuff repair, subacromial decompression;  Surgeon: Susa Day, MD;  Location: [facility];  Service: Orthopedics;  Laterality: Right;   TOTAL HIP ARTHROPLASTY Right 04/24/2017   Procedure: RIGHT TOTAL HIP ARTHROPLASTY ANTERIOR APPROACH;  Surgeon: Rod Can, MD;  Location: [facility];  Service: Orthopedics;  Laterality: Right;  Needs RNFA   TRANSCATHETER AORTIC VALVE REPLACEMENT, TRANSFEMORAL N/A 11/13/2021   Procedure: Transcatheter Aortic Valve Replacement, Transfemoral Edwards 23 MM SAPIEN 3 Ultra;  Surgeon: [name], MD;  Location: [place];  Service: Open Heart Surgery;  Laterality: N/A;  Transfemoral approach    MEDICATIONS:   amLODipine (NORVASC) 5 MG tablet   amoxicillin (AMOXIL) 500 MG tablet   aspirin 81 MG chewable tablet   Calcium Carbonate-Vitamin D (CALCIUM-D PO)   Cholecalciferol 50 MCG (2000 UT) TABS   Cyanocobalamin (B-12 PO)   escitalopram (LEXAPRO) 20 MG tablet   esomeprazole (NEXIUM) 40 MG capsule   ezetimibe (ZETIA) 10 MG tablet   ferrous sulfate 325 (65 FE) MG tablet   FIBER ADULT GUMMIES PO   GINKGO BILOBA PO   glucose blood test strip   HYDROcodone-acetaminophen (NORCO/VICODIN) 5-325 MG tablet   levothyroxine (SYNTHROID) 75 MCG tablet   Multiple Vitamins-Minerals (WOMENS MULTI GUMMIES PO)   oxybutynin (DITROPAN XL) 15 MG 24 hr tablet   rosuvastatin (CRESTOR) 10 MG tablet   traMADol (ULTRAM) 50 MG tablet   traZODone (DESYREL) 50 MG tablet   No current facility-administered medications for this encounter.

## 2022-01-24 NOTE — Anesthesia Preprocedure Evaluation (Deleted)
Anesthesia Evaluation    Airway        Dental   Pulmonary           Cardiovascular hypertension,      Neuro/Psych    GI/Hepatic   Endo/Other  diabetes    Renal/GU      Musculoskeletal   Abdominal   Peds  Hematology   Anesthesia Other Findings   Reproductive/Obstetrics                             Anesthesia Physical Anesthesia Plan  ASA:   Anesthesia Plan:    Post-op Pain Management:    Induction:   PONV Risk Score and Plan:   Airway Management Planned:   Additional Equipment:   Intra-op Plan:   Post-operative Plan:   Informed Consent:   Plan Discussed with:   Anesthesia Plan Comments: (PAT note written 01/24/2022 by Myra Gianotti, PA-C.  )       Anesthesia Quick Evaluation

## 2022-01-25 ENCOUNTER — Inpatient Hospital Stay (HOSPITAL_COMMUNITY): Payer: Medicare PPO | Admitting: Vascular Surgery

## 2022-01-25 ENCOUNTER — Encounter (HOSPITAL_COMMUNITY): Payer: Self-pay | Admitting: Neurosurgery

## 2022-01-25 ENCOUNTER — Inpatient Hospital Stay (HOSPITAL_COMMUNITY): Payer: Medicare PPO

## 2022-01-25 ENCOUNTER — Inpatient Hospital Stay (HOSPITAL_COMMUNITY)
Admission: RE | Disposition: A | Discharge status: Home / Self Care | Payer: Self-pay | Source: Home / Self Care | Attending: Neurosurgery

## 2022-01-25 ENCOUNTER — Inpatient Hospital Stay (HOSPITAL_COMMUNITY)
Admission: RE | Admit: 2022-01-25 | Discharge: 2022-01-26 | DRG: 460 | Disposition: A | Discharge status: Home / Self Care | Payer: Medicare PPO | Attending: Neurological Surgery | Admitting: Neurological Surgery

## 2022-01-25 ENCOUNTER — Other Ambulatory Visit: Payer: Self-pay

## 2022-01-25 ENCOUNTER — Inpatient Hospital Stay (HOSPITAL_COMMUNITY): Payer: Medicare PPO | Admitting: Anesthesiology

## 2022-01-25 DIAGNOSIS — Z96641 Presence of right artificial hip joint: Secondary | ICD-10-CM | POA: Diagnosis present

## 2022-01-25 DIAGNOSIS — M48061 Spinal stenosis, lumbar region without neurogenic claudication: Secondary | ICD-10-CM

## 2022-01-25 DIAGNOSIS — Z79899 Other long term (current) drug therapy: Secondary | ICD-10-CM

## 2022-01-25 DIAGNOSIS — Z79890 Hormone replacement therapy (postmenopausal): Secondary | ICD-10-CM | POA: Diagnosis not present

## 2022-01-25 DIAGNOSIS — E119 Type 2 diabetes mellitus without complications: Secondary | ICD-10-CM | POA: Diagnosis present

## 2022-01-25 DIAGNOSIS — Z8249 Family history of ischemic heart disease and other diseases of the circulatory system: Secondary | ICD-10-CM

## 2022-01-25 DIAGNOSIS — I10 Essential (primary) hypertension: Secondary | ICD-10-CM | POA: Diagnosis present

## 2022-01-25 DIAGNOSIS — I2510 Atherosclerotic heart disease of native coronary artery without angina pectoris: Secondary | ICD-10-CM | POA: Diagnosis present

## 2022-01-25 DIAGNOSIS — Z01818 Encounter for other preprocedural examination: Secondary | ICD-10-CM

## 2022-01-25 DIAGNOSIS — Z96612 Presence of left artificial shoulder joint: Secondary | ICD-10-CM | POA: Diagnosis present

## 2022-01-25 DIAGNOSIS — Z7982 Long term (current) use of aspirin: Secondary | ICD-10-CM

## 2022-01-25 DIAGNOSIS — Z90710 Acquired absence of both cervix and uterus: Secondary | ICD-10-CM | POA: Diagnosis not present

## 2022-01-25 DIAGNOSIS — E7800 Pure hypercholesterolemia, unspecified: Secondary | ICD-10-CM | POA: Diagnosis present

## 2022-01-25 DIAGNOSIS — M5116 Intervertebral disc disorders with radiculopathy, lumbar region: Secondary | ICD-10-CM | POA: Diagnosis present

## 2022-01-25 DIAGNOSIS — Z01812 Encounter for preprocedural laboratory examination: Secondary | ICD-10-CM | POA: Diagnosis not present

## 2022-01-25 DIAGNOSIS — Z888 Allergy status to other drugs, medicaments and biological substances status: Secondary | ICD-10-CM

## 2022-01-25 DIAGNOSIS — Z833 Family history of diabetes mellitus: Secondary | ICD-10-CM | POA: Diagnosis not present

## 2022-01-25 DIAGNOSIS — M4310 Spondylolisthesis, site unspecified: Principal | ICD-10-CM

## 2022-01-25 DIAGNOSIS — Z9049 Acquired absence of other specified parts of digestive tract: Secondary | ICD-10-CM | POA: Diagnosis not present

## 2022-01-25 DIAGNOSIS — M5416 Radiculopathy, lumbar region: Secondary | ICD-10-CM | POA: Diagnosis not present

## 2022-01-25 DIAGNOSIS — M4316 Spondylolisthesis, lumbar region: Secondary | ICD-10-CM | POA: Diagnosis not present

## 2022-01-25 DIAGNOSIS — Z952 Presence of prosthetic heart valve: Secondary | ICD-10-CM | POA: Diagnosis not present

## 2022-01-25 DIAGNOSIS — Z96611 Presence of right artificial shoulder joint: Secondary | ICD-10-CM | POA: Diagnosis present

## 2022-01-25 DIAGNOSIS — Z823 Family history of stroke: Secondary | ICD-10-CM

## 2022-01-25 DIAGNOSIS — H9192 Unspecified hearing loss, left ear: Secondary | ICD-10-CM | POA: Diagnosis present

## 2022-01-25 DIAGNOSIS — K219 Gastro-esophageal reflux disease without esophagitis: Secondary | ICD-10-CM | POA: Diagnosis present

## 2022-01-25 DIAGNOSIS — E118 Type 2 diabetes mellitus with unspecified complications: Secondary | ICD-10-CM | POA: Diagnosis present

## 2022-01-25 DIAGNOSIS — E039 Hypothyroidism, unspecified: Secondary | ICD-10-CM | POA: Diagnosis present

## 2022-01-25 DIAGNOSIS — M4326 Fusion of spine, lumbar region: Secondary | ICD-10-CM | POA: Diagnosis not present

## 2022-01-25 HISTORY — PX: ANTERIOR LATERAL LUMBAR FUSION WITH PERCUTANEOUS SCREW 1 LEVEL: SHX5553

## 2022-01-25 LAB — GLUCOSE, CAPILLARY
Glucose-Capillary: 122 mg/dL — ABNORMAL HIGH (ref 70–99)
Glucose-Capillary: 164 mg/dL — ABNORMAL HIGH (ref 70–99)

## 2022-01-25 SURGERY — ANTERIOR LATERAL LUMBAR FUSION WITH PERCUTANEOUS SCREW 1 LEVEL
Anesthesia: General | Laterality: Left

## 2022-01-25 MED ORDER — FENTANYL CITRATE (PF) 250 MCG/5ML IJ SOLN
INTRAMUSCULAR | Status: DC | PRN
  Administered 2022-01-25: 150 ug via INTRAVENOUS
  Administered 2022-01-25: 125 ug via INTRAVENOUS
  Administered 2022-01-25: 100 ug via INTRAVENOUS

## 2022-01-25 MED ORDER — PHENOL 1.4 % MT LIQD: 1.0000 | OROMUCOSAL | Status: DC | PRN

## 2022-01-25 MED ORDER — PROMETHAZINE HCL 25 MG/ML IJ SOLN: 6.2500 mg | INTRAMUSCULAR | Status: DC | PRN

## 2022-01-25 MED ORDER — 0.9 % SODIUM CHLORIDE (POUR BTL) OPTIME
TOPICAL | Status: DC | PRN
  Administered 2022-01-25: 2000 mL

## 2022-01-25 MED ORDER — ESCITALOPRAM OXALATE 10 MG PO TABS
20.0000 mg | ORAL_TABLET | Freq: Every day | ORAL | Status: DC
  Administered 2022-01-26: 20 mg via ORAL
  Filled 2022-01-25: qty 2

## 2022-01-25 MED ORDER — CHLORHEXIDINE GLUCONATE 0.12 % MT SOLN
OROMUCOSAL | Status: AC
  Administered 2022-01-25: 15 mL via OROMUCOSAL
  Filled 2022-01-25: qty 15

## 2022-01-25 MED ORDER — ALBUTEROL SULFATE (2.5 MG/3ML) 0.083% IN NEBU
INHALATION_SOLUTION | RESPIRATORY_TRACT | Status: AC
  Filled 2022-01-25: qty 3

## 2022-01-25 MED ORDER — THROMBIN 5000 UNITS EX SOLR
CUTANEOUS | Status: AC
  Filled 2022-01-25: qty 10000

## 2022-01-25 MED ORDER — LACTATED RINGERS IV SOLN: INTRAVENOUS | Status: DC

## 2022-01-25 MED ORDER — FENTANYL CITRATE (PF) 250 MCG/5ML IJ SOLN
INTRAMUSCULAR | Status: AC
  Filled 2022-01-25: qty 5

## 2022-01-25 MED ORDER — SODIUM CHLORIDE 0.9 % IV SOLN
250.0000 mL | INTRAVENOUS | Status: DC
  Administered 2022-01-25: 250 mL via INTRAVENOUS

## 2022-01-25 MED ORDER — PROPOFOL 10 MG/ML IV BOLUS
INTRAVENOUS | Status: AC
  Filled 2022-01-25: qty 20

## 2022-01-25 MED ORDER — LIDOCAINE 2% (20 MG/ML) 5 ML SYRINGE
INTRAMUSCULAR | Status: DC | PRN
  Administered 2022-01-25: 100 mg via INTRAVENOUS

## 2022-01-25 MED ORDER — FENTANYL CITRATE (PF) 100 MCG/2ML IJ SOLN
INTRAMUSCULAR | Status: AC
  Filled 2022-01-25: qty 2

## 2022-01-25 MED ORDER — OXYBUTYNIN CHLORIDE ER 5 MG PO TB24
15.0000 mg | ORAL_TABLET | Freq: Every day | ORAL | Status: DC
  Administered 2022-01-25: 15 mg via ORAL
  Filled 2022-01-25 (×2): qty 1

## 2022-01-25 MED ORDER — BISACODYL 10 MG RE SUPP: 10.0000 mg | Freq: Every day | RECTAL | Status: DC | PRN

## 2022-01-25 MED ORDER — ACETAMINOPHEN 500 MG PO TABS
ORAL_TABLET | ORAL | Status: AC
  Administered 2022-01-25: 1000 mg via ORAL
  Filled 2022-01-25: qty 2

## 2022-01-25 MED ORDER — TRAZODONE HCL 50 MG PO TABS
25.0000 mg | ORAL_TABLET | Freq: Every day | ORAL | Status: DC
  Administered 2022-01-25: 25 mg via ORAL
  Filled 2022-01-25: qty 1

## 2022-01-25 MED ORDER — PHENYLEPHRINE HCL-NACL 20-0.9 MG/250ML-% IV SOLN
INTRAVENOUS | Status: DC | PRN
  Administered 2022-01-25: 25 ug/min via INTRAVENOUS

## 2022-01-25 MED ORDER — SUCCINYLCHOLINE CHLORIDE 200 MG/10ML IV SOSY
PREFILLED_SYRINGE | INTRAVENOUS | Status: DC | PRN
  Administered 2022-01-25: 120 mg via INTRAVENOUS

## 2022-01-25 MED ORDER — DIAZEPAM 5 MG PO TABS: 5.0000 mg | ORAL_TABLET | Freq: Four times a day (QID) | ORAL | Status: DC | PRN

## 2022-01-25 MED ORDER — HYDROCODONE-ACETAMINOPHEN 10-325 MG PO TABS
1.0000 | ORAL_TABLET | ORAL | Status: DC | PRN
  Administered 2022-01-25 – 2022-01-26 (×2): 1 via ORAL
  Filled 2022-01-25 (×2): qty 1

## 2022-01-25 MED ORDER — FERROUS SULFATE 325 (65 FE) MG PO TABS
325.0000 mg | ORAL_TABLET | Freq: Every day | ORAL | Status: DC
  Administered 2022-01-26: 325 mg via ORAL
  Filled 2022-01-25: qty 1

## 2022-01-25 MED ORDER — CEFAZOLIN SODIUM-DEXTROSE 2-4 GM/100ML-% IV SOLN
INTRAVENOUS | Status: AC
  Filled 2022-01-25: qty 100

## 2022-01-25 MED ORDER — THROMBIN (RECOMBINANT) 5000 UNITS EX SOLR: CUTANEOUS | Status: DC | PRN

## 2022-01-25 MED ORDER — ONDANSETRON HCL 4 MG PO TABS: 4.0000 mg | ORAL_TABLET | Freq: Four times a day (QID) | ORAL | Status: DC | PRN

## 2022-01-25 MED ORDER — ONDANSETRON HCL 4 MG/2ML IJ SOLN: 4.0000 mg | Freq: Four times a day (QID) | INTRAMUSCULAR | Status: DC | PRN

## 2022-01-25 MED ORDER — ADULT MULTIVITAMIN W/MINERALS CH
1.0000 | ORAL_TABLET | Freq: Every day | ORAL | Status: DC
  Administered 2022-01-26: 1 via ORAL
  Filled 2022-01-25: qty 1

## 2022-01-25 MED ORDER — ACETAMINOPHEN 325 MG PO TABS: 650.0000 mg | ORAL_TABLET | ORAL | Status: DC | PRN

## 2022-01-25 MED ORDER — EPHEDRINE SULFATE-NACL 50-0.9 MG/10ML-% IV SOSY
PREFILLED_SYRINGE | INTRAVENOUS | Status: DC | PRN
  Administered 2022-01-25 (×3): 10 mg via INTRAVENOUS

## 2022-01-25 MED ORDER — CEFAZOLIN SODIUM-DEXTROSE 1-4 GM/50ML-% IV SOLN
1.0000 g | Freq: Three times a day (TID) | INTRAVENOUS | Status: AC
  Administered 2022-01-25 (×2): 1 g via INTRAVENOUS
  Filled 2022-01-25 (×2): qty 50

## 2022-01-25 MED ORDER — ORAL CARE MOUTH RINSE: 15.0000 mL | Freq: Once | OROMUCOSAL | Status: AC

## 2022-01-25 MED ORDER — MENTHOL 3 MG MT LOZG: 1.0000 | LOZENGE | OROMUCOSAL | Status: DC | PRN

## 2022-01-25 MED ORDER — BUPIVACAINE HCL (PF) 0.25 % IJ SOLN
INTRAMUSCULAR | Status: AC
  Filled 2022-01-25: qty 30

## 2022-01-25 MED ORDER — SODIUM CHLORIDE 0.9% FLUSH: 3.0000 mL | INTRAVENOUS | Status: DC | PRN

## 2022-01-25 MED ORDER — CHLORHEXIDINE GLUCONATE CLOTH 2 % EX PADS: 6.0000 | MEDICATED_PAD | Freq: Once | CUTANEOUS | Status: DC

## 2022-01-25 MED ORDER — INSULIN ASPART 100 UNIT/ML IJ SOLN: 0.0000 [IU] | INTRAMUSCULAR | Status: DC | PRN

## 2022-01-25 MED ORDER — PANTOPRAZOLE SODIUM 40 MG PO TBEC: 80.0000 mg | DELAYED_RELEASE_TABLET | Freq: Every day | ORAL | Status: DC

## 2022-01-25 MED ORDER — POLYETHYLENE GLYCOL 3350 17 G PO PACK: 17.0000 g | PACK | Freq: Every day | ORAL | Status: DC | PRN

## 2022-01-25 MED ORDER — AMISULPRIDE (ANTIEMETIC) 5 MG/2ML IV SOLN: 10.0000 mg | Freq: Once | INTRAVENOUS | Status: DC | PRN

## 2022-01-25 MED ORDER — LEVOTHYROXINE SODIUM 75 MCG PO TABS
75.0000 ug | ORAL_TABLET | Freq: Every day | ORAL | Status: DC
  Administered 2022-01-26: 75 ug via ORAL
  Filled 2022-01-25: qty 1

## 2022-01-25 MED ORDER — ALBUTEROL SULFATE (2.5 MG/3ML) 0.083% IN NEBU: 2.5000 mg | INHALATION_SOLUTION | Freq: Four times a day (QID) | RESPIRATORY_TRACT | Status: DC | PRN

## 2022-01-25 MED ORDER — FENTANYL CITRATE (PF) 100 MCG/2ML IJ SOLN
25.0000 ug | INTRAMUSCULAR | Status: DC | PRN
  Administered 2022-01-25 (×3): 50 ug via INTRAVENOUS

## 2022-01-25 MED ORDER — PROPOFOL 10 MG/ML IV BOLUS
INTRAVENOUS | Status: DC | PRN
  Administered 2022-01-25: 150 mg via INTRAVENOUS
  Administered 2022-01-25: 50 mg via INTRAVENOUS

## 2022-01-25 MED ORDER — AMLODIPINE BESYLATE 5 MG PO TABS
7.5000 mg | ORAL_TABLET | Freq: Every day | ORAL | Status: DC
  Administered 2022-01-26: 7.5 mg via ORAL
  Filled 2022-01-25: qty 1

## 2022-01-25 MED ORDER — ONDANSETRON HCL 4 MG/2ML IJ SOLN
INTRAMUSCULAR | Status: DC | PRN
  Administered 2022-01-25: 4 mg via INTRAVENOUS

## 2022-01-25 MED ORDER — ROSUVASTATIN CALCIUM 5 MG PO TABS
10.0000 mg | ORAL_TABLET | Freq: Every day | ORAL | Status: DC
  Administered 2022-01-26: 10 mg via ORAL
  Filled 2022-01-25: qty 2

## 2022-01-25 MED ORDER — TRAMADOL HCL 50 MG PO TABS: 50.0000 mg | ORAL_TABLET | Freq: Four times a day (QID) | ORAL | Status: DC | PRN

## 2022-01-25 MED ORDER — SODIUM CHLORIDE 0.9% FLUSH
3.0000 mL | Freq: Two times a day (BID) | INTRAVENOUS | Status: DC
  Administered 2022-01-25 – 2022-01-26 (×2): 3 mL via INTRAVENOUS

## 2022-01-25 MED ORDER — CEFAZOLIN SODIUM-DEXTROSE 2-4 GM/100ML-% IV SOLN
2.0000 g | INTRAVENOUS | Status: AC
  Administered 2022-01-25: 2 g via INTRAVENOUS

## 2022-01-25 MED ORDER — BUPIVACAINE HCL (PF) 0.25 % IJ SOLN
INTRAMUSCULAR | Status: DC | PRN
  Administered 2022-01-25: 30 mL

## 2022-01-25 MED ORDER — ACETAMINOPHEN 650 MG RE SUPP: 650.0000 mg | RECTAL | Status: DC | PRN

## 2022-01-25 MED ORDER — HYDROMORPHONE HCL 1 MG/ML IJ SOLN: 1.0000 mg | INTRAMUSCULAR | Status: DC | PRN

## 2022-01-25 MED ORDER — ACETAMINOPHEN 500 MG PO TABS: 1000.0000 mg | ORAL_TABLET | Freq: Once | ORAL | Status: AC

## 2022-01-25 MED ORDER — FLEET ENEMA 7-19 GM/118ML RE ENEM: 1.0000 | ENEMA | Freq: Once | RECTAL | Status: DC | PRN

## 2022-01-25 MED ORDER — OXYCODONE HCL 5 MG PO TABS
10.0000 mg | ORAL_TABLET | ORAL | Status: DC | PRN
  Administered 2022-01-25 (×2): 10 mg via ORAL
  Filled 2022-01-25 (×2): qty 2

## 2022-01-25 MED ORDER — EZETIMIBE 10 MG PO TABS
10.0000 mg | ORAL_TABLET | Freq: Every day | ORAL | Status: DC
  Administered 2022-01-26: 10 mg via ORAL
  Filled 2022-01-25: qty 1

## 2022-01-25 MED ORDER — CHLORHEXIDINE GLUCONATE 0.12 % MT SOLN: 15.0000 mL | Freq: Once | OROMUCOSAL | Status: AC

## 2022-01-25 MED ORDER — VITAMIN D 25 MCG (1000 UNIT) PO TABS
2000.0000 [IU] | ORAL_TABLET | Freq: Every day | ORAL | Status: DC
  Administered 2022-01-26: 2000 [IU] via ORAL
  Filled 2022-01-25: qty 2

## 2022-01-25 MED ORDER — DEXAMETHASONE SODIUM PHOSPHATE 10 MG/ML IJ SOLN
INTRAMUSCULAR | Status: DC | PRN
  Administered 2022-01-25: 10 mg via INTRAVENOUS

## 2022-01-25 SURGICAL SUPPLY — 52 items
BAG COUNTER SPONGE SURGICOUNT (BAG) ×1 IMPLANT
BAG DECANTER FOR FLEXI CONT (MISCELLANEOUS) ×1 IMPLANT
BENZOIN TINCTURE PRP APPL 2/3 (GAUZE/BANDAGES/DRESSINGS) ×1 IMPLANT
BLADE CLIPPER SURG (BLADE) IMPLANT
BONE MATRIX OSTEOCEL PRO LRG (Bone Implant) IMPLANT
BUR MATCHSTICK NEURO 3.0 LAGG (BURR) IMPLANT
CAGE MODULUS XL 8X18X55 - 10 (Cage) IMPLANT
COVER BACK TABLE 60X90IN (DRAPES) ×1 IMPLANT
DERMABOND ADVANCED .7 DNX12 (GAUZE/BANDAGES/DRESSINGS) ×2 IMPLANT
DRAPE C-ARM 42X72 X-RAY (DRAPES) ×1 IMPLANT
DRAPE C-ARMOR (DRAPES) ×1 IMPLANT
DRAPE LAPAROTOMY 100X72X124 (DRAPES) ×1 IMPLANT
DRAPE SURG 17X23 STRL (DRAPES) ×2 IMPLANT
DRSG OPSITE POSTOP 3X4 (GAUZE/BANDAGES/DRESSINGS) IMPLANT
DRSG OPSITE POSTOP 4X6 (GAUZE/BANDAGES/DRESSINGS) IMPLANT
DURAPREP 26ML APPLICATOR (WOUND CARE) ×1 IMPLANT
ELECT REM PT RETURN 9FT ADLT (ELECTROSURGICAL) ×1
ELECTRODE REM PT RTRN 9FT ADLT (ELECTROSURGICAL) ×1 IMPLANT
GAUZE 4X4 16PLY ~~LOC~~+RFID DBL (SPONGE) IMPLANT
GLOVE BIO SURGEON STRL SZ 6.5 (GLOVE) ×1 IMPLANT
GLOVE BIO SURGEON STRL SZ7.5 (GLOVE) IMPLANT
GLOVE BIOGEL PI IND STRL 6.5 (GLOVE) ×1 IMPLANT
GLOVE BIOGEL PI IND STRL 7.5 (GLOVE) IMPLANT
GLOVE ECLIPSE 9.0 STRL (GLOVE) ×1 IMPLANT
GLOVE EXAM NITRILE XL STR (GLOVE) IMPLANT
GOWN STRL REUS W/ TWL LRG LVL3 (GOWN DISPOSABLE) IMPLANT
GOWN STRL REUS W/ TWL XL LVL3 (GOWN DISPOSABLE) ×2 IMPLANT
GOWN STRL REUS W/TWL 2XL LVL3 (GOWN DISPOSABLE) IMPLANT
GOWN STRL REUS W/TWL LRG LVL3 (GOWN DISPOSABLE) ×1
GOWN STRL REUS W/TWL XL LVL3 (GOWN DISPOSABLE) ×1
KIT BASIN OR (CUSTOM PROCEDURE TRAY) ×1 IMPLANT
KIT DILATOR XLIF 5 (KITS) IMPLANT
KIT SURGICAL ACCESS MAXCESS 4 (KITS) IMPLANT
KIT TURNOVER KIT B (KITS) ×1 IMPLANT
MODULE NVM5 NEXT GEN EMG (NEEDLE) IMPLANT
NDL I-PASS III (NEEDLE) IMPLANT
NEEDLE HYPO 22GX1.5 SAFETY (NEEDLE) ×1 IMPLANT
NEEDLE I-PASS III (NEEDLE) ×1 IMPLANT
NS IRRIG 1000ML POUR BTL (IV SOLUTION) ×1 IMPLANT
PACK LAMINECTOMY NEURO (CUSTOM PROCEDURE TRAY) ×1 IMPLANT
ROD RELINE MAS TI LORD 5.5X40 (Rod) IMPLANT
SCREW LOCK RELINE 5.5 TULIP (Screw) IMPLANT
SCREW RELINE MAS POLY 5.5X45MM (Screw) IMPLANT
SPONGE T-LAP 4X18 ~~LOC~~+RFID (SPONGE) IMPLANT
STRIP CLOSURE SKIN 1/2X4 (GAUZE/BANDAGES/DRESSINGS) ×1 IMPLANT
SUT VIC AB 2-0 CT1 18 (SUTURE) ×2 IMPLANT
SUT VIC AB 3-0 SH 8-18 (SUTURE) ×2 IMPLANT
TOWEL GREEN STERILE (TOWEL DISPOSABLE) ×1 IMPLANT
TOWEL GREEN STERILE FF (TOWEL DISPOSABLE) ×1 IMPLANT
TRAY FOLEY MTR SLVR 14FR STAT (SET/KITS/TRAYS/PACK) IMPLANT
TRAY FOLEY MTR SLVR 16FR STAT (SET/KITS/TRAYS/PACK) ×1 IMPLANT
WATER STERILE IRR 1000ML POUR (IV SOLUTION) ×1 IMPLANT

## 2022-01-25 NOTE — Brief Op Note (Signed)
01/25/2022  9:55 AM  PATIENT:  Cheryl Cooper  81 y.o. female  PRE-OPERATIVE DIAGNOSIS:  Stenosis  POST-OPERATIVE DIAGNOSIS:  Stenosis  PROCEDURE:  Procedure(s): ANTERIOR LATERAL LUMBAR FUSION WITH PERCUTANEOUS SCREW LUMBAR THREE-FOUR (Left)  SURGEON:  Surgeon(s) and Role:    * Earnie Larsson, MD - Primary  PHYSICIAN ASSISTANT:   ASSISTANTSMearl Latin   ANESTHESIA:   general  EBL:  20 mL   BLOOD ADMINISTERED:none  DRAINS: none   LOCAL MEDICATIONS USED:  MARCAINE     SPECIMEN:  No Specimen  DISPOSITION OF SPECIMEN:  N/A  COUNTS:  YES  TOURNIQUET:  * No tourniquets in log *  DICTATION: .Dragon Dictation  PLAN OF CARE: Admit to inpatient   PATIENT DISPOSITION:  PACU - hemodynamically stable.   Delay start of Pharmacological VTE agent (>24hrs) due to surgical blood loss or risk of bleeding: yes

## 2022-01-25 NOTE — Progress Notes (Signed)
Patient still due to void. Bladder scan patient scanned 52m.

## 2022-01-25 NOTE — Op Note (Signed)
Date of procedure: 01/25/2022  Date of dictation: Same  Service: Neurosurgery  Preoperative diagnosis: L3-4 degenerative lateral listhesis, grade 1 with foraminal stenosis and radiculopathy  Postoperative diagnosis: Same  Procedure Name: Left L3-4 anterior lateral retroperitoneal interbody decompression and fusion utilizing interbody cage and morselized autograft.  L3-4 percutaneous pedicle screw fixation  Surgeon:Rohith Fauth A.Lawsen Arnott, M.D.  Asst. Surgeon: Reinaldo Meeker, NP  Anesthesia: General  Indication: 81 year old female with intractable back and left lower extremity pain consistent with a left-sided L3 radiculopathy which is failed conservative manage.  Workup demonstrates evidence of severe degeneration with degenerative lateral listhesis and marked foraminal stenosis on the left at L3-4.  Patient presents now for decompression and fusion in hopes of improving her symptoms.  Operative note: After induction of anesthesia, patient positioned in the right lateral decubitus position and appropriately padded.  Fluoroscopy was used to align the patient properly and the bed was significantly flexed which reduced the patient's deformity.  In the left flank was prepped and draped sterilely.  Incision made overlying L3-4 on the left.  A secondary incision was made in the left flank.  Using blunt dissection through the left flank access was gained to the retroperitoneal space and the peritoneal sac was mobilized anteriorly.  Returning to the lateral incision a dilator was then passed through the abdominal wall and passed through the left psoas muscle docking into the left L3-4 disc space.  Trajectory was confirmed by AP and lateral fluoroscopy.  A guidewire was then passed into the disc space at L3-4.  Sequential dilators were then used.  Between changes and dilators the dilators were stimulated with constant neural monitoring and found to have no adjacent neural structures.  The self-retaining retractor was  placed.  Once again the space was extensively stimulated and found to be free of any traversing nerves.  The shim was then docked into the disc base at L3-4 under fluoroscopic guidance.  The retractor was widened.  The disc base was cleaned of overlying psoas muscle fibers and directly inspected.  Again no evidence of any traversing nerve structures.  Once again things were stimulated and found to be safe.  The disc base was then incised.  Discectomy was then performed using various instruments and curettes.  Contralateral release was performed using elevators.  The disc base was then tried with a an 8 mm lordotic implant.  This fit well and reduced the patient's deformity completely.  Given her advanced age I did not feel that any further distraction would be appropriate.  An 8 mm x 18 mm x 55 mm cage was then packed with Osteocel plus.  The disc base was further cleaned of soft tissue and then the cage was then impacted in the place and found to be in good position both AP and lateral plane.  Cage wire was removed.  Hemostasis was excellent.  Wound was irrigated.  Retractor system was removed.  Bed was returned to a neutral position.  Pedicle of L3 and L4 were visualized on intraoperative fluoroscopy.  Incision made around the pedicle was then made in the patient's lumbar region on the left side.  Jamshidi needle core sutures were then passed into the pedicles of L3 and L4 under fluoroscopic guidance and continuous nerve monitoring.  Guidewires were passed into the vertebral bodies of L3 and L4.  The pedicles were tapped using a screw tap.  5.5 x 45 mm screws were placed on the left side at L3 and L4.  Final images revealed good through the  cages and the hardware at the proper operative level.  A 40 mm rod was then passed through the screw towers.  Locking caps placed and the screw towers.  Locking caps then given a final tightening and engaged.  Final images obtained.  Towers were removed.  Wounds were irrigated  and closed in typical fashion.  Steri-Strips and sterile dressing were applied.  No apparent complications.  Patient tolerated the procedure well and she returned to the recovery room postop.

## 2022-01-25 NOTE — Anesthesia Procedure Notes (Signed)
Procedure Name: Intubation Date/Time: 01/25/2022 8:14 AM  Performed by: Mariea Clonts, CRNAPre-anesthesia Checklist: Patient identified, Emergency Drugs available, Suction available and Patient being monitored Patient Re-evaluated:Patient Re-evaluated prior to induction Oxygen Delivery Method: Circle System Utilized Preoxygenation: Pre-oxygenation with 100% oxygen Induction Type: IV induction Ventilation: Mask ventilation without difficulty Laryngoscope Size: Mac and 3 Grade View: Grade II Tube type: Oral Tube size: 7.0 mm Number of attempts: 1 Airway Equipment and Method: Stylet and Oral airway Placement Confirmation: ETT inserted through vocal cords under direct vision, positive ETCO2 and breath sounds checked- equal and bilateral Tube secured with: Tape Dental Injury: Teeth and Oropharynx as per pre-operative assessment

## 2022-01-25 NOTE — Transfer of Care (Signed)
Immediate Anesthesia Transfer of Care Note  Patient: Cheryl Cooper  Procedure(s) Performed: ANTERIOR LATERAL LUMBAR FUSION WITH PERCUTANEOUS SCREW LUMBAR THREE-FOUR (Left)  Patient Location: PACU  Anesthesia Type:General  Level of Consciousness: drowsy and patient cooperative  Airway & Oxygen Therapy: Patient Spontanous Breathing and Patient connected to face mask oxygen  Post-op Assessment: Report given to RN, Post -op Vital signs reviewed and stable, and Patient moving all extremities X 4  Post vital signs: Reviewed and stable  Last Vitals:  Vitals Value Taken Time  BP 95/76 01/25/22 1030  Temp    Pulse 77 01/25/22 1036  Resp 14 01/25/22 1036  SpO2 97 % 01/25/22 1036  Vitals shown include unvalidated device data.  Last Pain:  Vitals:   01/25/22 0650  PainSc: 0-No pain         Complications: No notable events documented.

## 2022-01-25 NOTE — Anesthesia Preprocedure Evaluation (Addendum)
Anesthesia Evaluation  Patient identified by MRN, date of birth, ID band Patient awake    Reviewed: Allergy & Precautions, NPO status , Patient's Chart, lab work & pertinent test results  History of Anesthesia Complications Negative for: history of anesthetic complications  Airway Mallampati: IV  TM Distance: >3 FB Neck ROM: Full    Dental no notable dental hx. (+) Dental Advisory Given   Pulmonary neg pulmonary ROS   Pulmonary exam normal        Cardiovascular hypertension, Pt. on medications (-) angina + CAD (10/2021 cath: moderate D1)  Normal cardiovascular exam+ Valvular Problems/Murmurs (severe) AS   S/P TAVR 11/13/21  RHC/LHC (pre-TAVR) 10/24/21:    1st Diag lesion is 60% stenosed.   1.  Patent coronary arteries (right dominant) with a moderate ostial diagonal stenosis and no other significant coronary artery disease 2.  Normal right heart pressures and cardiac output 3.  Severe calcification and restriction of the aortic valve leaflets seen on plain fluoroscopy with known severe aortic stenosis.  Severe calcification also identified in the aortic root   Recommendations: Continue evaluation for TAVR.    09/2021 ECHO: EF 70-75%, hyperdynamic LVF with mod LVH, Grade 1 DD, normal RVF, bicuspid AV with very severe AS, with mean grad 69 mmHg, peak grad 94 mmHg, AVA (VTI) 0.46 cm2   Neuro/Psych   Anxiety Depression    CVA, No Residual Symptoms    GI/Hepatic Neg liver ROS,GERD  Medicated and Controlled,,  Endo/Other  diabetesHypothyroidism    Renal/GU negative Renal ROS     Musculoskeletal   Abdominal   Peds  Hematology negative hematology ROS (+)   Anesthesia Other Findings   Reproductive/Obstetrics                             Anesthesia Physical Anesthesia Plan  ASA: 3  Anesthesia Plan: MAC   Post-op Pain Management: Tylenol PO (pre-op)*   Induction:   PONV Risk Score and Plan:  4 or greater and Ondansetron, Dexamethasone, Treatment may vary due to age or medical condition and Diphenhydramine  Airway Management Planned: Oral ETT and Video Laryngoscope Planned  Additional Equipment: ClearSight  Intra-op Plan:   Post-operative Plan: Extubation in OR  Informed Consent: I have reviewed the patients History and Physical, chart, labs and discussed the procedure including the risks, benefits and alternatives for the proposed anesthesia with the patient or authorized representative who has indicated his/her understanding and acceptance.     Dental advisory given  Plan Discussed with: Anesthesiologist and CRNA  Anesthesia Plan Comments:         Anesthesia Quick Evaluation

## 2022-01-25 NOTE — Anesthesia Postprocedure Evaluation (Signed)
Anesthesia Post Note  Patient: Cheryl Cooper  Procedure(s) Performed: ANTERIOR LATERAL LUMBAR FUSION WITH PERCUTANEOUS SCREW LUMBAR THREE-FOUR (Left)     Patient location during evaluation: PACU Anesthesia Type: General Level of consciousness: sedated Pain management: pain level controlled Vital Signs Assessment: post-procedure vital signs reviewed and stable Respiratory status: spontaneous breathing and respiratory function stable Cardiovascular status: stable Postop Assessment: no apparent nausea or vomiting Anesthetic complications: no  No notable events documented.  Last Vitals:  Vitals:   01/25/22 1145 01/25/22 1200  BP: (!) 110/56 (!) 119/57  Pulse: 76 73  Resp: 13 17  Temp:  (!) 36.1 C  SpO2: 100% 97%    Last Pain:  Vitals:   01/25/22 1200  PainSc: 1                  Victor Langenbach DANIEL

## 2022-01-25 NOTE — H&P (Signed)
Cheryl Cooper is an 81 y.o. female.   Chief Complaint: Back pain HPI: 81 year old female with progressive debilitating lumbar pain with radiation to her left anterior thigh feeling cerebrovascular workup demonstrates evidence of significant to space collapse with severe foraminal stenosis at L3-4 with marked lateral listhesis at this level.  Patient has failed conservative management presents now for decompression and fusion in hopes improving her symptoms.  Past Medical History:  Diagnosis Date   Anxiety    Arthritis    Carpal tunnel syndrome    Deafness in left ear    Depression    Diabetes mellitus    diet controlled   Diverticulitis    GERD (gastroesophageal reflux disease)    Hypercholesteremia    Hypertension    Hypothyroid    Memory loss    reports d/t concussion   PONV (postoperative nausea and vomiting) yrs ago, none recent   Post concussion syndrome    S/P TAVR (transcatheter aortic valve replacement) 11/13/2021   s/p TAVR with a 23 mm Edwards S3UR via the TF approach by Dr. Burt Knack and Dr. Lavonna Monarch   Severe aortic stenosis     Past Surgical History:  Procedure Laterality Date   [place]  2010   lower   BIOPSY  01/12/2018   Procedure: BIOPSY;  Surgeon: Irving Copas., MD;  Location: [facility];  Service: Gastroenterology;;   Wilmon Pali RELEASE Right 2023   CHOLECYSTECTOMY     ESOPHAGOGASTRODUODENOSCOPY (EGD) WITH PROPOFOL N/A 01/12/2018   Procedure: ESOPHAGOGASTRODUODENOSCOPY (EGD) WITH PROPOFOL;  Surgeon: Irving Copas., MD;  Location: [facility];  Service: Gastroenterology;  Laterality: N/A;   EYE SURGERY Right    cataract   INTRAOPERATIVE TRANSTHORACIC ECHOCARDIOGRAM N/A 11/13/2021   Procedure: INTRAOPERATIVE TRANSTHORACIC ECHOCARDIOGRAM;  Surgeon: [name], MD;  Location: [place];  Service: Open Heart Surgery;  Laterality: N/A;   left elobow surgery  2003   left rotator cuff  2001   LUMBAR  LAMINECTOMY/DECOMPRESSION MICRODISCECTOMY Left 12/23/2017   Procedure: Laminectomy for facet/synovial cyst - left - Lumbar three-Lumbar four;  Surgeon: Earnie Larsson, MD;  Location: [place];  Service: Neurosurgery;  Laterality: Left;   r foot surgery  1995   REVERSE SHOULDER ARTHROPLASTY Left 10/23/2018   Procedure: REVERSE TOTAL SHOULDER ARTHROPLASTY;  Surgeon: Netta Cedars, MD;  Location: [facility];  Service: Orthopedics;  Laterality: Left;  interscalene block   REVERSE SHOULDER ARTHROPLASTY Right 04/20/2021   Procedure: REVERSE SHOULDER ARTHROPLASTY;  Surgeon: Netta Cedars, MD;  Location: [facility];  Service: Orthopedics;  Laterality: Right;  with ISB   right hand surgery  2001   RIGHT HEART CATH AND CORONARY ANGIOGRAPHY N/A 10/24/2021   Procedure: RIGHT HEART CATH AND CORONARY ANGIOGRAPHY;  Surgeon: [name], MD;  Location: [facility] [facility];  Service: Cardiovascular;  Laterality: N/A;   right index finger surgery  2009   SHOULDER OPEN ROTATOR CUFF REPAIR  11/21/2011   Procedure: ROTATOR CUFF REPAIR SHOULDER OPEN;  Surgeon: Johnn Hai, MD;  Location: [facility];  Service: Orthopedics;  Laterality: Right;  with subacromial decompression   SHOULDER OPEN ROTATOR CUFF REPAIR Right 05/09/2016   Procedure: Right shoulder mini open revision rotator cuff repair, subacromial decompression;  Surgeon: Susa Day, MD;  Location: [facility];  Service: Orthopedics;  Laterality: Right;   TOTAL HIP ARTHROPLASTY Right 04/24/2017   Procedure: RIGHT TOTAL HIP ARTHROPLASTY ANTERIOR APPROACH;  Surgeon: Rod Can, MD;  Location: [facility];  Service: Orthopedics;  Laterality: Right;  Needs RNFA   TRANSCATHETER AORTIC VALVE REPLACEMENT, TRANSFEMORAL N/A 11/13/2021   Procedure: Transcatheter Aortic Valve Replacement, Transfemoral Edwards 23 MM SAPIEN 3 Ultra;  Surgeon: [name], MD;  Location: [place];  Service: Open Heart Surgery;  Laterality: N/A;  Transfemoral approach    Family History  Problem Relation  Age of Onset   Heart attack Mother    Stroke Father    Heart attack Father    Stroke Sister    Stroke Brother    Diabetes Brother    Dementia Brother    Heart disease Brother    Stroke Sister        TIA   Social History:  reports that she has never smoked. She has never used smokeless tobacco. She reports current alcohol use of about 1.0 standard drink of alcohol per week. She reports that she does not use drugs.  Allergies:  Allergies  Allergen Reactions   Other Other (See Comments) and Rash    Tomato sauce, garlic, onion - severe acid reflux    Flexeril [Cyclobenzaprine] Other (See Comments)    Per spouse "she felt like a zombie"   Chlordiazepoxide-Clidinium Other (See Comments)    Dizziness (intolerance)   Codeine Nausea Only and Rash   Lipitor [Atorvastatin] Other (See Comments)    Unbalanced    Medications Prior to Admission  Medication Sig Dispense Refill   amLODipine (NORVASC) 5 MG tablet TAKE 1 AND 1/2 TABLET BY MOUTH DAILY 135 tablet 1   amoxicillin (AMOXIL) 500 MG tablet Take 2,000 mg by mouth as directed. Take 4 capsules (2000 mg) by mouth 1 hour prior to dental appointments     aspirin 81 MG chewable tablet Chew 1 tablet (81 mg total) by mouth daily.     Calcium Carbonate-Vitamin D (CALCIUM-D PO) Take 2 tablets by mouth in the morning. CHEWABLES     Cholecalciferol 50 MCG (2000 UT) TABS Take 2,000 Units by mouth in the morning.     Cyanocobalamin (B-12 PO) Take 1 tablet by mouth in the morning.     escitalopram (LEXAPRO) 20 MG tablet Take 1 tablet (20 mg total) by mouth daily. 90 tablet 0   esomeprazole (NEXIUM) 40 MG capsule Take 1 capsule (40 mg total) by mouth 2 (two) times daily before a meal. 60 capsule 0   ezetimibe (ZETIA) 10 MG tablet TAKE ONE TABLET BY MOUTH DAILY 90 tablet 2   ferrous sulfate 325 (65 FE) MG tablet Take 325 mg by mouth daily with breakfast.     FIBER ADULT GUMMIES PO Take 2 tablets by mouth in the morning. Fiber Well Gummy Supplement      GINKGO BILOBA PO Take 1 capsule by mouth in the morning.     HYDROcodone-acetaminophen (NORCO/VICODIN) 5-325 MG tablet Take 1 tablet by mouth every 6 (six) hours as needed (pain).     levothyroxine (SYNTHROID) 75 MCG tablet Take 1 tablet (75 mcg total) by mouth daily with breakfast. 90 tablet 2   Multiple Vitamins-Minerals (WOMENS MULTI GUMMIES PO) Take 2 tablets by mouth daily.     oxybutynin (DITROPAN XL) 15 MG 24 hr tablet TAKE ONE TABLET BY MOUTH AT BEDTIME 90 tablet 1   rosuvastatin (CRESTOR) 10 MG tablet TAKE ONE TABLET BY MOUTH DAILY 90 tablet 3   traMADol (ULTRAM) 50 MG tablet Take 1 tablet (50 mg total) by mouth every 6 (six) hours as needed. 30 tablet 0   traZODone (DESYREL) 50 MG tablet Take 0.5 tablets (25 mg total) by mouth  at bedtime. 45 tablet 1   glucose blood test strip daily.      Results for orders placed or performed during the hospital encounter of 01/25/22 (from the past 48 hour(s))  Glucose, capillary     Status: Abnormal   Collection Time: 01/25/22  6:57 AM  Result Value Ref Range   Glucose-Capillary 122 (H) 70 - 99 mg/dL    Comment: Glucose reference range applies only to samples taken after fasting for at least 8 hours.   No results found.  Pertinent items noted in HPI and remainder of comprehensive ROS otherwise negative.  Blood pressure (!) 153/74, pulse (!) 57, temperature 98.3 F (36.8 C), resp. rate 16, height '5\' 3"'$  (1.6 m), weight 64.6 kg, SpO2 95 %.  Patient is awake and alert.  She is oriented and appropriate.  Speech is fluent.  Judgment insight are intact.  Cranial nerve function normal bilateral including examination of the nose intact motor strength except patient has some mild weakness her left hip flexor and left quadriceps muscle group.  Sensory examination with some decrease sensation in her left L3.  Deep tendon flexes normal active.  No evidence of long track signs.  Gait antalgic.  Posterior to recently normal.  Examination head ears eyes nose  throat is on the left.  Chest and abdomen benign.  Extremities are free from injury deformity. Assessment/Plan L3-4 degenerative disc disease with marked degenerative lateral listhesis and foraminal stenosis.  Plan left L3-4 retroperitoneal anterior lateral interbody decompression and fusion utilizing interbody cage and morselized allograft coupled with posterior percutaneous pedicle screw fixation.  Risks and benefits been explained.  Patient wishes to proceed.  Cooper Render Davita Sublett 01/25/2022, 7:49 AM

## 2022-01-25 NOTE — TOC Initial Note (Signed)
Transition of Care [facility]) - Initial/Assessment Note    Patient Details  Name: Cheryl Cooper MRN: [id number] Date of Birth: 1940/06/13  Transition of Care [facility]) CM/SW Contact:    Verdell Carmine, RN Phone Number: 01/25/2022, 3:50 PM  Clinical Narrative:                  81 year old with increase in debilitation of spine had lumbar fusion Lives at home with spouse PT OT consult for tomorrow. Will likely need DME walker.    TOC will follow for needs, recommendations,and transitions of Care   Barriers to Discharge: Continued Medical Work up   Patient Goals and CMS Choice        Expected Discharge Plan and Services     Discharge Planning Services: CM Consult   Living arrangements for the past 2 months: Single Family Home                                      Prior Living Arrangements/Services Living arrangements for the past 2 months: Single Family Home Lives with:: Spouse Patient language and need for interpreter reviewed:: Yes        Need for Family Participation in Patient Care: Yes (Comment) Care giver support system in place?: Yes (comment)   Criminal Activity/Legal Involvement Pertinent to Current Situation/Hospitalization: No - Comment as needed  Activities of Daily Living Home Assistive Devices/Equipment: Grab bars in shower, Walker (specify type) ADL Screening (condition at time of admission) Patient's cognitive ability adequate to safely complete daily activities?: Yes Is the patient deaf or have difficulty hearing?: Yes (deaf in left ear) Does the patient have difficulty seeing, even when wearing glasses/contacts?: No Does the patient have difficulty concentrating, remembering, or making decisions?: No Patient able to express need for assistance with ADLs?: Yes Does the patient have difficulty dressing or bathing?: No Independently performs ADLs?: Yes (appropriate for developmental age) Does the patient have difficulty walking or climbing  stairs?: Yes Weakness of Legs: None Weakness of Arms/Hands: None  Permission Sought/Granted                  Emotional Assessment       Orientation: : Oriented to Self, Oriented to Place, Oriented to  Time Alcohol / Substance Use: Not Applicable Psych Involvement: No (comment)  Admission diagnosis:  Degenerative spondylolisthesis [M43.10] Patient Active Problem List   Diagnosis Date Noted   Degenerative spondylolisthesis 01/25/2022   S/P TAVR (transcatheter aortic valve replacement) 11/13/2021   Severe aortic stenosis    Urine frequency 07/26/2021   Depression 06/25/2021   S/P shoulder replacement, right 04/20/2021   Slow transit constipation 03/20/2021   Anemia 03/20/2021   Tick bite of abdomen 07/11/2020   Hyperlipidemia 06/22/2020   Type 2 diabetes, diet controlled ([place]) 06/22/2020   Chronic back pain 06/22/2020   Transaminitis 06/22/2020   PUD (peptic ulcer disease) 03/21/2018   Synovial cyst of lumbar facet joint 12/23/2017   Brainstem stroke ([place]) 01/04/2015   Rotator cuff tear, right 11/21/2011   PCP:  Libby Maw, MD Pharmacy:   [id number] - [place], [place] [address] [place] [address] Phone: [phone] Fax: [phone]     Social Determinants of Health (SDOH) Interventions    Readmission Risk Interventions     No data to display

## 2022-01-25 NOTE — Progress Notes (Signed)
Orthopedic Tech Progress Note Patient Details:  Cheryl Cooper [birth year] [id number]  Patient ID: [name], female   DOB: 12-18-1940, 81 y.o.   MRN: [id number] Patient already has a brace. [name] 01/25/2022, 5:55 PM

## 2022-01-26 MED ORDER — HYDROCODONE-ACETAMINOPHEN 10-325 MG PO TABS: 1.0000 | ORAL_TABLET | ORAL | 0 refills | Status: DC | PRN

## 2022-01-26 NOTE — Discharge Summary (Signed)
Discharge Summary  Date of Admission: 01/25/2022  Date of Discharge: 01/26/22  Attending Physician: Emelda Brothers, MD  Hospital Course: Patient was admitted following an uncomplicated XLIF. They were recovered in PACU and transferred to 5N. Their preop symptoms were improved, their hospital course was uncomplicated and the patient was discharged home. They will follow up in clinic with me in clinic in 2 weeks.  Neurologic exam at discharge:  Strength 5/5 x4 and SILTx4 except approach-related psoas weakness  Discharge diagnosis: Lumbar radiculopathy  Judith Part, MD 01/26/22 10:22 AM

## 2022-01-26 NOTE — Evaluation (Signed)
Occupational Therapy Evaluation Patient Details Name: Cheryl Cooper MRN: [id number] DOB: Nov 24, 1940 Today's Date: 01/26/2022   History of Present Illness Pt is a 81 y/o female who presents s/p L3-L4 ALIF on 01/25/2022. PMH signficant for Carpal tunnel syndrome, L ear deafness, DM, HTN, hypothyroidism, post concussive syndrome, TAVR 2023, prior back surgeries x2, L elbow surgery 2003, bilateral rotator cuff repair, bilateral RSA 2020 and 2023, R THA 2019.   Clinical Impression   Prior to this admission, patient living with her spouse, and independent in ADLs with the occasional help with lower body dressing as her pain progressed. Patient typically walked with a rollator at home, and endorses falls in the last six months. Currently, patient presenting with minimal pain from back surgery and need for min assist to complete lower body ADLs. Cues required throughout to adhere to back precautions and RW management, however would correct when cued. Education with regard to toileting and dressing completed in session with patient, with min A provided for lower body dressing (husband is able to assist). Patient with no need for further OT acutely and no OT follow up. Please re-consult if further acute needs arise.      Recommendations for follow up therapy are one component of a multi-disciplinary discharge planning process, led by the attending physician.  Recommendations may be updated based on patient status, additional functional criteria and insurance authorization.   Follow Up Recommendations  No OT follow up     Assistance Recommended at Discharge Set up Supervision/Assistance  Patient can return home with the following A little help with walking and/or transfers;A little help with bathing/dressing/bathroom;Assist for transportation;Help with stairs or ramp for entrance    Functional Status Assessment  Patient has had a recent decline in their functional status and demonstrates the ability to  make significant improvements in function in a reasonable and predictable amount of time.  Equipment Recommendations  None recommended by OT (Patient has DME needed)    Recommendations for Other Services       Precautions / Restrictions Precautions Precautions: Fall;Back Precaution Booklet Issued: Yes (comment) Precaution Comments: Reviewed handout and pt was cued for precautions during functional mobility. Required Braces or Orthoses: Spinal Brace Spinal Brace: Lumbar corset;Applied in sitting position Restrictions Weight Bearing Restrictions: No      Mobility Bed Mobility               General bed mobility comments: up  in chair upon OT arrival    Transfers Overall transfer level: Needs assistance Equipment used: Rolling walker (2 wheels) Transfers: Sit to/from Stand Sit to Stand: Min guard           General transfer comment: VC's for hand placement on seated surface for safety. No assist required but hands on guarding provided for safety.      Balance Overall balance assessment: Needs assistance Sitting-balance support: Feet supported, No upper extremity supported Sitting balance-Leahy Scale: Fair     Standing balance support: No upper extremity supported, During functional activity Standing balance-Leahy Scale: Fair Standing balance comment: Statically. Requires UE support for dynamic activity.                           ADL either performed or assessed with clinical judgement   ADL Overall ADL's : Needs assistance/impaired Eating/Feeding: Modified independent   Grooming: Modified independent   Upper Body Bathing: Set up   Lower Body Bathing: Minimal assistance;Sitting/lateral leans;Sit to/from stand   Upper Body  Dressing : Set up   Lower Body Dressing: Minimal assistance Lower Body Dressing Details (indicate cue type and reason): to don shoes and thread pants and underwear over feet, unable to complete figure four Toilet Transfer:  Min guard;Ambulation;Regular Toilet;Rolling walker (2 wheels) Toilet Transfer Details (indicate cue type and reason): cues for safety throughout with walker management Toileting- Clothing Manipulation and Hygiene: Set up;Sit to/from stand;Sitting/lateral lean       Functional mobility during ADLs: Min guard General ADL Comments: Patient presenting with minimal pain from back surgery and need for min assist to complete lower body ADLs. Cues required throughout to adhere to back precautions and RW management.     Vision         Perception     Praxis      Pertinent Vitals/Pain Pain Assessment Pain Assessment: 0-10 Pain Score: 3  Pain Location: Incision site Pain Descriptors / Indicators: Operative site guarding, Tender     Hand Dominance Right   Extremity/Trunk Assessment Upper Extremity Assessment Upper Extremity Assessment: RUE deficits/detail RUE Deficits / Details: Pt reports thumb is numb since carpal tunnel release.   Lower Extremity Assessment Lower Extremity Assessment: Defer to PT evaluation RLE Deficits / Details: Decreased strength and muscular endurance consistent with pre-op diagnosis. Bilaterally with general weakness.   Cervical / Trunk Assessment Cervical / Trunk Assessment: Back Surgery   Communication Communication Communication: No difficulties   Cognition Arousal/Alertness: Awake/alert Behavior During Therapy: WFL for tasks assessed/performed Overall Cognitive Status: Within Functional Limits for tasks assessed                                       General Comments       Exercises     Shoulder Instructions      Home Living Family/patient expects to be discharged to:: Private residence Living Arrangements: Spouse/significant other Available Help at Discharge: Family;Available 24 hours/day Type of Home: House Home Access: Stairs to enter [employer] of Steps: 2 Entrance Stairs-Rails: Left Home Layout: One  level     Bathroom Shower/Tub: Occupational psychologist: Handicapped height     Home Equipment: Shower seat - built Medical sales representative (2 wheels);Rollator (4 wheels);Cane - quad;BSC/3in1          Prior Functioning/Environment Prior Level of Function : Independent/Modified Independent;History of Falls (last six months)             Mobility Comments: Using the rollator all the time ADLs Comments: Assist with lower body dressing occaisionally        OT Problem List: Decreased strength;Decreased activity tolerance;Impaired balance (sitting and/or standing);Decreased coordination;Decreased safety awareness;Decreased knowledge of use of DME or AE;Decreased knowledge of precautions;Pain      OT Treatment/Interventions:      OT Goals(Current goals can be found in the care plan section) Acute Rehab OT Goals Patient Stated Goal: to get back home OT Goal Formulation: With patient Time For Goal Achievement: 02/09/22 Potential to Achieve Goals: Good  OT Frequency:      Co-evaluation              AM-PAC OT "6 Clicks" Daily Activity     Outcome Measure Help from another person eating meals?: None Help from another person taking care of personal grooming?: None Help from another person toileting, which includes using toliet, bedpan, or urinal?: A Little Help from another person bathing (including washing, rinsing, drying)?: A  Little Help from another person to put on and taking off regular upper body clothing?: None Help from another person to put on and taking off regular lower body clothing?: A Little 6 Click Score: 21   End of Session Equipment Utilized During Treatment: Back brace;Rolling walker (2 wheels) Nurse Communication: Mobility status  Activity Tolerance: Patient tolerated treatment well Patient left: in chair;with call bell/phone within reach  OT Visit Diagnosis: Unsteadiness on feet (R26.81);Other abnormalities of gait and mobility (R26.89);Muscle  weakness (generalized) (M62.81);Pain Pain - part of body:  (back)                Time: 8138-8719 OT Time Calculation (min): 21 min Charges:  OT General Charges $OT Visit: 1 Visit OT Evaluation $OT Eval Moderate Complexity: 1 Mod  Corinne Ports E. Jaysten Essner, OTR/L Acute Rehabilitation Services [phone]   Ascencion Dike 01/26/2022, 10:59 AM

## 2022-01-26 NOTE — TOC Transition Note (Signed)
Transition of Care [facility]) - CM/SW Discharge Note   Patient Details  Name: Cheryl Cooper MRN: [id number] Date of Birth: 23-Jul-1940  Transition of Care [facility]) CM/SW Contact:  Bartholomew Crews, RN Phone Number: [phone] 01/26/2022, 10:32 AM   Clinical Narrative:     Patient to transition home today. Noted PT recommended no f/u needed and that patient has rollator at home that she prefers. No TOC needs identified at this time.   Final next level of care: Home/Self Care Barriers to Discharge: No Barriers Identified   Patient Goals and CMS Choice        Discharge Placement                       Discharge Plan and Services   Discharge Planning Services: CM Consult                                 Social Determinants of Health (SDOH) Interventions     Readmission Risk Interventions     No data to display

## 2022-01-26 NOTE — Progress Notes (Addendum)
Neurosurgery Service Progress Note  Subjective: No acute events overnight, some psoas pain with HF otherwise no complaints, doing well, wants to go home   Objective: Vitals:   01/25/22 1929 01/26/22 0021 01/26/22 0453 01/26/22 0755  BP: 123/60 (!) 118/54 (!) 120/55 (!) 130/58  Pulse: 79 72 66 65  Resp: '17 17 17 18  '$ Temp: 98 F (36.7 C) 98 F (36.7 C) 97.7 F (36.5 C) 98 F (36.7 C)  TempSrc: Oral Axillary Axillary Oral  SpO2: 91%  96% 96%  Weight:      Height:        Physical Exam: Strength 5/5 x4 and SILTx4 except approach-related psoas weakness  Assessment & Plan: 81 y.o. woman s/p XLIF, recovering well.  -discharge home today  Judith Part  01/26/22 10:20 AM

## 2022-01-26 NOTE — Evaluation (Signed)
Physical Therapy Evaluation  Patient Details Name: Cheryl Cooper MRN: [id number] DOB: 06-08-1940 Today's Date: 01/26/2022  History of Present Illness  Pt is a 81 y/o female who presents s/p L3-L4 ALIF on 01/25/2022. PMH signficant for Carpal tunnel syndrome, L ear deafness, DM, HTN, hypothyroidism, post concussive syndrome, TAVR 2023, prior back surgeries x2, L elbow surgery 2003, bilateral rotator cuff repair, bilateral RSA 2020 and 2023, R THA 2019.   Clinical Impression  Pt admitted with above diagnosis. At the time of PT eval, pt was able to demonstrate transfers and ambulation with gross min guard assist progressing to supervision for safety by end of session. Pt used to using the rollator and had some difficulty maneuvering the rolling walker at times, however feel she will be safe returning to the rollator at home if that's what she prefers. Pt was educated on precautions, brace application/wearing schedule, appropriate activity progression, and car transfer. Pt currently with functional limitations due to the deficits listed below (see PT Problem List). Pt will benefit from skilled PT to increase their independence and safety with mobility to allow discharge to the venue listed below.         Recommendations for follow up therapy are one component of a multi-disciplinary discharge planning process, led by the attending physician.  Recommendations may be updated based on patient status, additional functional criteria and insurance authorization.  Follow Up Recommendations No PT follow up      Assistance Recommended at Discharge PRN  Patient can return home with the following  A little help with walking and/or transfers;A little help with bathing/dressing/bathroom;Assistance with cooking/housework;Assist for transportation;Help with stairs or ramp for entrance    Equipment Recommendations None recommended by PT  Recommendations for Other Services       Functional Status Assessment  Patient has had a recent decline in their functional status and demonstrates the ability to make significant improvements in function in a reasonable and predictable amount of time.     Precautions / Restrictions Precautions Precautions: Fall;Back Precaution Booklet Issued: Yes (comment) Precaution Comments: Reviewed handout and pt was cued for precautions during functional mobility. Required Braces or Orthoses: Spinal Brace Spinal Brace: Lumbar corset;Applied in sitting position Restrictions Weight Bearing Restrictions: No      Mobility  Bed Mobility Overal bed mobility: Needs Assistance Bed Mobility: Rolling, Sidelying to Sit Rolling: Min assist Sidelying to sit: Min assist       General bed mobility comments: Assist for full roll onto R side and to elevate trunk into sitting position. Increased time and cues required for optimal log roll.    Transfers Overall transfer level: Needs assistance Equipment used: Rolling walker (2 wheels) Transfers: Sit to/from Stand Sit to Stand: Min guard           General transfer comment: VC's for hand placement on seated surface for safety. No assist required but hands on guarding provided for safety.    Ambulation/Gait Ambulation/Gait assistance: Min guard, Supervision Gait Distance (Feet): 270 Feet Assistive device: Rolling walker (2 wheels) Gait Pattern/deviations: Step-through pattern, Decreased stride length, Trunk flexed Gait velocity: Decreased Gait velocity interpretation: 1.31 - 2.62 ft/sec, indicative of limited community ambulator   General Gait Details: VC's for improved posture, closer walker proximity and forward gaze. Occasional cues not to twist while looking around the hall. Pt veering L however pt attributes this to the wheels of the walker.  Stairs Stairs: Yes Stairs assistance: Min guard Stair Management: One rail Left, Step to pattern, Forwards Number  of Stairs: 2 General stair comments: VC's for sequencing  and general safety.  Wheelchair Mobility    Modified Rankin (Stroke Patients Only)       Balance Overall balance assessment: Needs assistance Sitting-balance support: Feet supported, No upper extremity supported Sitting balance-Leahy Scale: Fair     Standing balance support: No upper extremity supported, During functional activity Standing balance-Leahy Scale: Fair Standing balance comment: Statically. Requires UE support for dynamic activity.                             Pertinent Vitals/Pain Pain Assessment Pain Assessment: 0-10 Pain Score: 3  Pain Location: Incision site Pain Descriptors / Indicators: Operative site guarding, Tender Pain Intervention(s): Limited activity within patient's tolerance, Monitored during session, Repositioned    Home Living Family/patient expects to be discharged to:: Private residence Living Arrangements: Spouse/significant other Available Help at Discharge: Family;Available 24 hours/day Type of Home: House Home Access: Stairs to enter Entrance Stairs-Rails: Left Entrance Stairs-Number of Steps: 2   Home Layout: One level Home Equipment: Shower seat - built Medical sales representative (2 wheels);Rollator (4 wheels);Cane - quad;BSC/3in1      Prior Function Prior Level of Function : Independent/Modified Independent;History of Falls (last six months)             Mobility Comments: Using the rollator all the time       Hand Dominance   Dominant Hand: Right    Extremity/Trunk Assessment   Upper Extremity Assessment Upper Extremity Assessment: RUE deficits/detail RUE Deficits / Details: Pt reports thumb is numb since carpal tunnel release.    Lower Extremity Assessment Lower Extremity Assessment: RLE deficits/detail RLE Deficits / Details: Decreased strength and muscular endurance consistent with pre-op diagnosis. Bilaterally with general weakness.    Cervical / Trunk Assessment Cervical / Trunk Assessment: Back Surgery   Communication   Communication: No difficulties  Cognition Arousal/Alertness: Awake/alert Behavior During Therapy: WFL for tasks assessed/performed Overall Cognitive Status: Within Functional Limits for tasks assessed                                          General Comments      Exercises     Assessment/Plan    PT Assessment Patient needs continued PT services  PT Problem List Decreased strength;Decreased activity tolerance;Decreased range of motion;Decreased balance;Decreased mobility;Decreased knowledge of use of DME;Decreased safety awareness;Decreased knowledge of precautions;Pain       PT Treatment Interventions DME instruction;Gait training;Stair training;Functional mobility training;Therapeutic activities;Therapeutic exercise;Balance training;Patient/family education    PT Goals (Current goals can be found in the Care Plan section)  Acute Rehab PT Goals Patient Stated Goal: Home today, and for this to be her last back surgery PT Goal Formulation: With patient Time For Goal Achievement: 02/02/22 Potential to Achieve Goals: Good    Frequency Min 5X/week     Co-evaluation               AM-PAC PT "6 Clicks" Mobility  Outcome Measure Help needed turning from your back to your side while in a flat bed without using bedrails?: A Little Help needed moving from lying on your back to sitting on the side of a flat bed without using bedrails?: A Little Help needed moving to and from a bed to a chair (including a wheelchair)?: A Little Help needed standing up from a chair  using your arms (e.g., wheelchair or bedside chair)?: A Little Help needed to walk in hospital room?: A Little Help needed climbing 3-5 steps with a railing? : A Little 6 Click Score: 18    End of Session Equipment Utilized During Treatment: Gait belt;Back brace Activity Tolerance: Patient tolerated treatment well Patient left: in chair;with call bell/phone within reach Nurse  Communication: Mobility status PT Visit Diagnosis: Unsteadiness on feet (R26.81);Pain Pain - part of body:  (incision site)    Time: 7416-3845 PT Time Calculation (min) (ACUTE ONLY): 34 min   Charges:   PT Evaluation $PT Eval Low Complexity: 1 Low PT Treatments $Gait Training: 8-22 mins        Rolinda Roan, PT, DPT Acute Rehabilitation Services Secure Chat Preferred Office: [phone]   Thelma Comp 01/26/2022, 8:54 AM

## 2022-01-26 NOTE — Plan of Care (Signed)

## 2022-01-28 ENCOUNTER — Encounter (HOSPITAL_COMMUNITY): Payer: Self-pay | Admitting: Neurosurgery

## 2022-01-28 ENCOUNTER — Telehealth: Payer: Self-pay

## 2022-01-28 MED FILL — Thrombin For Soln 5000 Unit: CUTANEOUS | Qty: 2 | Status: AC

## 2022-01-28 NOTE — Patient Outreach (Signed)
  Care Coordination TOC Note Transition Care Management Follow-up Telephone Call Date of discharge and from where: [facility] 01[facility]26[facility]22 How have you been since you were released from the hospital? "I am still in pain but that is to be expected". Any questions or concerns? No  Items Reviewed: Did the pt receive and understand the discharge instructions provided? Yes  Medications obtained and verified? Yes  Other? No  Any new allergies since your discharge? No  Dietary orders reviewed? No Do you have support at home? Yes   Home Care and Equipment/Supplies: Were home health services ordered? no If so, what is the name of the agency? N/a  Has the agency set up a time to come to the patient's home? not applicable Were any new equipment or medical supplies ordered?  No What is the name of the medical supply agency? N/a Were you able to get the supplies/equipment? not applicable Do you have any questions related to the use of the equipment or supplies? No  Functional Questionnaire: (I = Independent and D = Dependent) ADLs: needs assistance  Bathing/Dressing- needs assistance  Meal Prep- needs assistance  Eating- needs assistance  Maintaining continence- I  Transferring/Ambulation- needs assistance  Managing Meds- I  Follow up appointments reviewed:  PCP Hospital f/u appt confirmed? No   Specialist Hospital f/u appt confirmed? Yes  Scheduled to see Dr. Earnie Larsson (surgeon)one week Are transportation arrangements needed? No  If their condition worsens, is the pt aware to call PCP or go to the Emergency Dept.? Yes Was the patient provided with contact information for the PCP's office or ED? Yes Was to pt encouraged to call back with questions or concerns? Yes  SDOH assessments and interventions completed:   Yes SDOH Interventions Today    Flowsheet Row Most Recent Value  SDOH Interventions   Housing Interventions Intervention Not Indicated  Transportation Interventions  Intervention Not Indicated       Care Coordination Interventions:  No Care Coordination interventions needed at this time.   Encounter Outcome:  Pt. Visit Completed

## 2022-02-22 ENCOUNTER — Telehealth: Payer: Self-pay | Admitting: Family Medicine

## 2022-02-22 DIAGNOSIS — F32A Depression, unspecified: Secondary | ICD-10-CM

## 2022-02-22 DIAGNOSIS — E785 Hyperlipidemia, unspecified: Secondary | ICD-10-CM

## 2022-02-22 MED ORDER — ESOMEPRAZOLE MAGNESIUM 40 MG PO CPDR: 40.0000 mg | DELAYED_RELEASE_CAPSULE | Freq: Two times a day (BID) | ORAL | 0 refills | Status: DC

## 2022-02-22 MED ORDER — EZETIMIBE 10 MG PO TABS: 10.0000 mg | ORAL_TABLET | Freq: Every day | ORAL | 2 refills | Status: DC

## 2022-02-22 MED ORDER — ESCITALOPRAM OXALATE 20 MG PO TABS: 20.0000 mg | ORAL_TABLET | Freq: Every day | ORAL | 0 refills | Status: DC

## 2022-02-22 NOTE — Telephone Encounter (Signed)
Caller Name: pt  Call back phone #: [phone]    MEDICATION(S):  escitalopram (LEXAPRO) 20 MG tablet [[id number]]  ezetimibe (ZETIA) 10 MG tablet [[id number]]  esomeprazole (NEXIUM) 40 MG capsule [[id number]]   Days of Med Remaining:   Has the patient contacted their pharmacy (YES/NO)? Yes  What did pharmacy advise?   Preferred Pharmacy:  [id number] [place], [place] - [place] [address] [place], [place] [address] Phone: [phone]  Fax: [phone]

## 2022-02-25 DIAGNOSIS — R52 Pain, unspecified: Secondary | ICD-10-CM | POA: Diagnosis not present

## 2022-02-25 DIAGNOSIS — M79642 Pain in left hand: Secondary | ICD-10-CM | POA: Diagnosis not present

## 2022-02-25 DIAGNOSIS — G5601 Carpal tunnel syndrome, right upper limb: Secondary | ICD-10-CM | POA: Diagnosis not present

## 2022-02-25 DIAGNOSIS — M65842 Other synovitis and tenosynovitis, left hand: Secondary | ICD-10-CM | POA: Diagnosis not present

## 2022-02-25 DIAGNOSIS — M79641 Pain in right hand: Secondary | ICD-10-CM | POA: Diagnosis not present

## 2022-02-27 DIAGNOSIS — M4310 Spondylolisthesis, site unspecified: Secondary | ICD-10-CM | POA: Diagnosis not present

## 2022-03-06 ENCOUNTER — Other Ambulatory Visit: Payer: Self-pay | Admitting: Gastroenterology

## 2022-03-06 DIAGNOSIS — K7460 Unspecified cirrhosis of liver: Secondary | ICD-10-CM

## 2022-03-13 DIAGNOSIS — Z961 Presence of intraocular lens: Secondary | ICD-10-CM | POA: Diagnosis not present

## 2022-03-13 DIAGNOSIS — H524 Presbyopia: Secondary | ICD-10-CM | POA: Diagnosis not present

## 2022-03-13 DIAGNOSIS — H5203 Hypermetropia, bilateral: Secondary | ICD-10-CM | POA: Diagnosis not present

## 2022-03-13 DIAGNOSIS — H11003 Unspecified pterygium of eye, bilateral: Secondary | ICD-10-CM | POA: Diagnosis not present

## 2022-03-13 DIAGNOSIS — H04123 Dry eye syndrome of bilateral lacrimal glands: Secondary | ICD-10-CM | POA: Diagnosis not present

## 2022-03-13 DIAGNOSIS — H52223 Regular astigmatism, bilateral: Secondary | ICD-10-CM | POA: Diagnosis not present

## 2022-03-20 ENCOUNTER — Other Ambulatory Visit: Payer: Self-pay | Admitting: Family Medicine

## 2022-03-20 DIAGNOSIS — I10 Essential (primary) hypertension: Secondary | ICD-10-CM

## 2022-03-27 ENCOUNTER — Telehealth: Payer: Self-pay | Admitting: Family Medicine

## 2022-03-27 NOTE — Telephone Encounter (Signed)
Left message for patient to call back and schedule Medicare Annual Wellness Visit (AWV) either virtually or in office. Left  my Cheryl Cooper number [phone]   Last AWV 10/05/18 please schedule with Nurse Health Adviser   45 min for awv-i  in office appointments 30 min for awv-s & awv-i phone/virtual appointments

## 2022-03-28 ENCOUNTER — Ambulatory Visit: Payer: Medicare PPO | Admitting: Nurse Practitioner

## 2022-03-28 ENCOUNTER — Encounter: Payer: Self-pay | Admitting: Nurse Practitioner

## 2022-03-28 VITALS — BP 100/70 | HR 61

## 2022-03-28 DIAGNOSIS — N952 Postmenopausal atrophic vaginitis: Secondary | ICD-10-CM | POA: Diagnosis not present

## 2022-03-28 DIAGNOSIS — N9489 Other specified conditions associated with female genital organs and menstrual cycle: Secondary | ICD-10-CM | POA: Diagnosis not present

## 2022-03-28 LAB — WET PREP FOR TRICH, YEAST, CLUE

## 2022-03-28 MED ORDER — ESTRADIOL 0.1 MG/GM VA CREA: 1.0000 g | TOPICAL_CREAM | VAGINAL | 1 refills | Status: DC

## 2022-03-28 NOTE — Telephone Encounter (Signed)
Returned patient call no answer  Patient returned my left message 2/7

## 2022-03-28 NOTE — Progress Notes (Signed)
   Acute Office Visit  Subjective:    Patient ID: DYESHA HENAULT, female    DOB: 05/31/1940, 82 y.o.   MRN: [id number]   HPI 82 y.o. presents today for what she feels like is a scratch on her vulva. She noticed it about a month ago. Unchanged. Denies itching, discharge or odor. Notices it most with urinating. Not sexually active.    Review of Systems  Constitutional: Negative.   Genitourinary:  Negative for dysuria, genital sores, vaginal bleeding, vaginal discharge and vaginal pain.       Vulvar redness       Objective:    Physical Exam Constitutional:      Appearance: Normal appearance.  Genitourinary:      Comments: Atrophic changes    BP 100/70   Pulse 61   SpO2 97%  Wt Readings from Last 3 Encounters:  01/25/22 142 lb 8 oz (64.6 kg)  01/22/22 142 lb 8 oz (64.6 kg)  01/08/22 140 lb (63.5 kg)        Patient informed chaperone available to be present for breast and/or pelvic exam. Patient has requested no chaperone to be present. Patient has been advised what will be completed during breast and pelvic exam.   Wet prep negative for pathogens  Assessment & Plan:   Problem List Items Addressed This Visit   None Visit Diagnoses     Atrophic vaginitis    -  Primary   Relevant Medications   estradiol (ESTRACE VAGINAL) 0.1 MG/GM vaginal cream   Vulvar burning       Relevant Orders   WET PREP FOR TRICH, YEAST, CLUE      Plan: Negative wet prep. Exam consistent with atrophic vaginitis. Discussed option for vaginal estrogen and she would like to start. Apply externally nightly x 2 weeks, then decrease to twice weekly. Discussed risk of small amount of systemic absorption. Coconut oil for lubrication.      Tamela Gammon DNP, 3:58 PM 03/28/2022

## 2022-04-03 DIAGNOSIS — M4310 Spondylolisthesis, site unspecified: Secondary | ICD-10-CM | POA: Diagnosis not present

## 2022-04-08 ENCOUNTER — Other Ambulatory Visit: Payer: Self-pay | Admitting: Family Medicine

## 2022-04-08 NOTE — Progress Notes (Signed)
Cardiology Office Note:   Date:  04/12/2022  NAME:  Cheryl Cooper    MRN: [id number] DOB:  1940-07-22   PCP:  Libby Maw, MD  Cardiologist:  Evalina Field, MD  Electrophysiologist:  None   Referring MD: Libby Maw,*   Chief Complaint  Patient presents with   Follow-up        History of Present Illness:   Cheryl Cooper is a 82 y.o. female with a hx of severe stenosis status post TAVR, hypertension, diabetes who presents for follow-up.  Underwent TAVR last year.  She reports she is doing well.  Denies any chest pain or trouble breathing.  She had back surgery, shoulder surgery, carpal tunnel surgery and TAVR last year.  She had quite a bit of procedures.  Doing well.  Denies any chest pain or trouble breathing.  Blood pressure is well-controlled.  She had a moderate lesion in first diagonal branch.  No chest pain from this.  On aspirin.  On statin.  Cholesterol level was at goal.  Due for repeat labs.  Doing well since TAVR.  Problem List 1. DM -A1c 6.4 2. HLD -T chol 150, HDL 47, LDL 63, TG 193 3. HTN 4. Aortic stenosis -23 mm S3 11/13/2021 5. CAD -60% D1  Past Medical History: Past Medical History:  Diagnosis Date   Anxiety    Arthritis    Carpal tunnel syndrome    Deafness in left ear    Depression    Diabetes mellitus    diet controlled   Diverticulitis    GERD (gastroesophageal reflux disease)    Hypercholesteremia    Hypertension    Hypothyroid    Memory loss    reports d/t concussion   PONV (postoperative nausea and vomiting) yrs ago, none recent   Post concussion syndrome    S/P TAVR (transcatheter aortic valve replacement) 11/13/2021   s/p TAVR with a 23 mm Edwards S3UR via the TF approach by Dr. Burt Knack and Dr. Lavonna Monarch   Severe aortic stenosis     Past Surgical History: Past Surgical History:  Procedure Laterality Date   ABDOMINAL HYSTERECTOMY  1986   ANTERIOR LATERAL LUMBAR FUSION WITH PERCUTANEOUS SCREW 1 LEVEL Left  01/25/2022   Procedure: ANTERIOR LATERAL LUMBAR FUSION WITH PERCUTANEOUS SCREW LUMBAR THREE-FOUR;  Surgeon: Earnie Larsson, MD;  Location: [facility];  Service: Neurosurgery;  Laterality: Left;   BACK SURGERY  2010   lower   BIOPSY  01/12/2018   Procedure: BIOPSY;  Surgeon: Rush Landmark Telford Nab., MD;  Location: [facility];  Service: Gastroenterology;;   Wilmon Pali RELEASE Right 2023   CHOLECYSTECTOMY     ESOPHAGOGASTRODUODENOSCOPY (EGD) WITH PROPOFOL N/A 01/12/2018   Procedure: ESOPHAGOGASTRODUODENOSCOPY (EGD) WITH PROPOFOL;  Surgeon: Irving Copas., MD;  Location: [facility];  Service: Gastroenterology;  Laterality: N/A;   EYE SURGERY Right    cataract   INTRAOPERATIVE TRANSTHORACIC ECHOCARDIOGRAM N/A 11/13/2021   Procedure: INTRAOPERATIVE TRANSTHORACIC ECHOCARDIOGRAM;  Surgeon: [name], MD;  Location: [facility];  Service: Open Heart Surgery;  Laterality: N/A;   left elobow surgery  2003   left rotator cuff  2001   LUMBAR LAMINECTOMY/DECOMPRESSION MICRODISCECTOMY Left 12/23/2017   Procedure: Laminectomy for facet/synovial cyst - left - Lumbar three-Lumbar four;  Surgeon: Earnie Larsson, MD;  Location: [facility];  Service: Neurosurgery;  Laterality: Left;   r foot surgery  1995   REVERSE SHOULDER ARTHROPLASTY Left 10/23/2018   Procedure: REVERSE TOTAL SHOULDER ARTHROPLASTY;  Surgeon: Netta Cedars, MD;  Location: [facility];  Service: Orthopedics;  Laterality: Left;  interscalene block   REVERSE SHOULDER ARTHROPLASTY Right 04/20/2021   Procedure: REVERSE SHOULDER ARTHROPLASTY;  Surgeon: Netta Cedars, MD;  Location: [facility];  Service: Orthopedics;  Laterality: Right;  with ISB   right hand surgery  2001   RIGHT HEART CATH AND CORONARY ANGIOGRAPHY N/A 10/24/2021   Procedure: RIGHT HEART CATH AND CORONARY ANGIOGRAPHY;  Surgeon: [name], MD;  Location: [facility];  Service: Cardiovascular;  Laterality: N/A;   right index finger surgery  2009   SHOULDER OPEN ROTATOR CUFF REPAIR   11/21/2011   Procedure: ROTATOR CUFF REPAIR SHOULDER OPEN;  Surgeon: Johnn Hai, MD;  Location: [facility];  Service: Orthopedics;  Laterality: Right;  with subacromial decompression   SHOULDER OPEN ROTATOR CUFF REPAIR Right 05/09/2016   Procedure: Right shoulder mini open revision rotator cuff repair, subacromial decompression;  Surgeon: Susa Day, MD;  Location: [facility];  Service: Orthopedics;  Laterality: Right;   TOTAL HIP ARTHROPLASTY Right 04/24/2017   Procedure: RIGHT TOTAL HIP ARTHROPLASTY ANTERIOR APPROACH;  Surgeon: Rod Can, MD;  Location: [facility];  Service: Orthopedics;  Laterality: Right;  Needs RNFA   TRANSCATHETER AORTIC VALVE REPLACEMENT, TRANSFEMORAL N/A 11/13/2021   Procedure: Transcatheter Aortic Valve Replacement, Transfemoral Edwards 23 MM SAPIEN 3 Ultra;  Surgeon: [name], MD;  Location: [facility];  Service: Open Heart Surgery;  Laterality: N/A;  Transfemoral approach    Current Medications: Current Meds  Medication Sig   amLODipine (NORVASC) 5 MG tablet TAKE 1 AND 1/2 TABLET BY MOUTH DAILY   aspirin 81 MG chewable tablet Chew 1 tablet (81 mg total) by mouth daily.   Calcium Carbonate-Vitamin D (CALCIUM-D PO) Take 2 tablets by mouth in the morning. CHEWABLES   Cholecalciferol 50 MCG (2000 UT) TABS Take 2,000 Units by mouth in the morning.   Cyanocobalamin (B-12 PO) Take 1 tablet by mouth in the morning.   escitalopram (LEXAPRO) 20 MG tablet Take 1 tablet (20 mg total) by mouth daily.   esomeprazole (NEXIUM) 40 MG capsule TAKE 1 CAPSULE BY MOUTH TWICE A DAY BEFORE A MEAL   estradiol (ESTRACE VAGINAL) 0.1 MG/GM vaginal cream Place 1 g vaginally 2 (two) times a week. Initial dose: apply externally nightly x 2 weeks, then twice weekly   ezetimibe (ZETIA) 10 MG tablet Take 1 tablet (10 mg total) by mouth daily.   ferrous sulfate 325 (65 FE) MG tablet Take 325 mg by mouth daily with breakfast.   FIBER ADULT GUMMIES PO Take 2 tablets by mouth in the morning. Fiber  Well Gummy Supplement   GINKGO BILOBA PO Take 1 capsule by mouth in the morning.   glucose blood test strip daily.   HYDROcodone-acetaminophen (NORCO) 10-325 MG tablet Take 1 tablet by mouth every 4 (four) hours as needed (pain).   levothyroxine (SYNTHROID) 75 MCG tablet Take 1 tablet (75 mcg total) by mouth daily with breakfast.   Multiple Vitamins-Minerals (WOMENS MULTI GUMMIES PO) Take 2 tablets by mouth daily.   oxybutynin (DITROPAN XL) 15 MG 24 hr tablet TAKE ONE TABLET BY MOUTH AT BEDTIME   rosuvastatin (CRESTOR) 10 MG tablet TAKE ONE TABLET BY MOUTH DAILY   traZODone (DESYREL) 50 MG tablet Take 0.5 tablets (25 mg total) by mouth at bedtime.     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  Tobacco Use   Smoking status: Never   Smokeless tobacco: Never  Vaping Use   Vaping Use: Never used  Substance and Sexual Activity   Alcohol use: Not Currently    Comment: occas   Drug use: No   Sexual activity: Not Currently    Partners: Male    Birth control/protection: Surgical    Comment: 1st intercourse 82 yo-Fewer than 5 partners, hysterectomy  Other Topics Concern   Not on file  Social History Narrative   Married, lives at home with husband   caffeine - 1 Coke daily   Social Determinants of Health   Financial Resource Strain: Not on file  Food Insecurity: No Food Insecurity (04/09/2022)   Hunger Vital Sign    Worried About Running Out of Food in the Last Year: Never true    [place] in the Last Year: Never true  Transportation Needs: No Transportation Needs (04/09/2022)   PRAPARE - Hydrologist  (Medical): No    Lack of Transportation (Non-Medical): No  Physical Activity: Not on file  Stress: Not on file  Social Connections: Not on file     Family History: The patient's family history includes Dementia in her brother; Diabetes in her brother; Heart attack in her father and mother; Heart disease in her brother; Stroke in her brother, father, sister, and sister.  ROS:   All other ROS reviewed and negative. Pertinent positives noted in the HPI.     EKGs/Labs/Other Studies Reviewed:   The following studies were personally reviewed by me today:  Recent Labs: 11/09/2021: ALT 37 11/14/2021: Magnesium 1.9 01/22/2022: BUN 11; Creatinine, Ser 0.97; Hemoglobin 13.9; Platelets 153; Potassium 3.9; Sodium 138   Recent Lipid Panel    Component Value Date/Time   CHOL 150 07/28/2020 0856   CHOL 147 01/21/2020 0958   TRIG 193.0 (H) 07/28/2020 0856   HDL 47.50 07/28/2020 0856   HDL 48 01/21/2020 0958   CHOLHDL 3 07/28/2020 0856   VLDL 38.6 07/28/2020 0856   LDLCALC 63 07/28/2020 0856   LDLCALC 77 01/21/2020 0958   LDLDIRECT 53.0 03/20/2021 1652    Physical Exam:   VS:  BP 134/62   Pulse 63   Ht '5\' 3"'$  (1.6 m)   Wt 142 lb 9.6 oz (64.7 kg)   SpO2 93%   BMI 25.26 kg/m    Wt Readings from Last 3 Encounters:  04/12/22 142 lb 9.6 oz (64.7 kg)  01/25/22 142 lb 8 oz (64.6 kg)  01/22/22 142 lb 8 oz (64.6 kg)    General: Well nourished, well developed, in no acute distress Head: Atraumatic, normal size  Eyes: PEERLA, EOMI  Neck: Supple, no JVD Endocrine: No thryomegaly Cardiac: Normal S1, S2; RRR; no murmurs, rubs, or gallops Lungs: Clear to auscultation bilaterally, no wheezing, rhonchi or rales  Abd: Soft, nontender, no hepatomegaly  Ext: No edema, pulses 2+ Musculoskeletal: No deformities, BUE and BLE strength normal and equal Skin: Warm and dry, no rashes   Neuro: Alert and oriented to person, place, time, and situation, CNII-XII grossly intact, no focal deficits  Psych:  Normal mood and affect   ASSESSMENT:   Cheryl Cooper is a 82 y.o. female who presents for the following: 1. Severe aortic stenosis  2. S/P TAVR (transcatheter aortic valve replacement)   3. Hyperlipidemia LDL goal <70   4. SBE (subacute bacterial endocarditis) prophylaxis candidate     PLAN:   1. Severe aortic stenosis 2. S/P TAVR (transcatheter aortic valve replacement) 3. Hyperlipidemia LDL goal <70 4. SBE (subacute bacterial endocarditis) prophylaxis candidate -Status post TAVR.  Doing well.  On aspirin 81 mg daily.  She understands she needs antibiotics before dental work.  She is without symptoms.  No murmur on exam.  Echo is stable after TAVR.  She did have moderate disease in a first diagonal branch.  This is managed medically.  No symptoms of angina.  On aspirin.  On statin.  Blood pressure is well-controlled.  She will work on her diabetes.  She will see [place] yearly.  Disposition: Return in about 1 year (around 04/13/2023).  Medication Adjustments/Labs and Tests Ordered: Current medicines are reviewed at length with the patient today.  Concerns regarding medicines are outlined above.  No orders of the defined types were placed in this encounter.  No orders of the defined types were placed in this encounter.   Patient Instructions  Medication Instructions:  The current medical regimen is effective;  continue present plan and medications.  *If you need a refill on your cardiac medications before your next appointment, please call your pharmacy*   Follow-Up: At [facility], you and your health needs are our priority.  As part of our continuing mission to provide you with exceptional heart care, we have created designated Provider Care Teams.  These Care Teams include your primary Cardiologist (physician) and Advanced Practice Providers (APPs -  Physician Assistants and Nurse Practitioners) who all work together to provide you with the care you need, when you need  it.  We recommend signing up for the patient portal called "MyChart".  Sign up information is provided on this After Visit Summary.  MyChart is used to connect with patients for Virtual Visits (Telemedicine).  Patients are able to view lab/test results, encounter notes, upcoming appointments, etc.  Non-urgent messages can be sent to your provider as well.   To learn more about what you can do with MyChart, go to [email or web address].    Your next appointment:   12 month(s)  Provider:   Evalina Field, MD       Time Spent with Patient: I have spent a total of 25 minutes with patient reviewing hospital notes, telemetry, EKGs, labs and examining the patient as well as establishing an assessment and plan that was discussed with the patient.  > 50% of time was spent in direct patient care.  Signed, Addison Naegeli. Audie Box, MD, [facility]  [address] [place], [place] [address] [phone]  04/12/2022 11:28 AM

## 2022-04-09 ENCOUNTER — Telehealth: Payer: Self-pay

## 2022-04-09 NOTE — Patient Instructions (Signed)
Visit Information  Thank you for taking time to visit with me today. Please don't hesitate to contact me if I can be of assistance to you.   Following are the goals we discussed today:   Goals Addressed             This Visit's Progress    COMPLETED: Care Coordination Activities-No follow up required       Interventions Today    Flowsheet Row Most Recent Value  Chronic Disease   Chronic disease during today's visit Diabetes  General Interventions   General Interventions Discussed/Reviewed General Interventions Discussed, Health Screening, Doctor Visits  Doctor Visits Discussed/Reviewed Doctor Visits Discussed  Health Screening Colonoscopy, Mammogram  Education Interventions   Education Provided Provided Education  Provided Verbal Education On Nutrition  Nutrition Interventions   Nutrition Discussed/Reviewed Nutrition Discussed, Decreasing sugar intake, Carbohydrate meal planning  Pharmacy Interventions   Pharmacy Dicussed/Reviewed Medications and their functions  Safety Interventions   Safety Discussed/Reviewed Fall Risk, Safety Discussed               If you are experiencing a Mental Health or [place] or need someone to talk to, please call the Suicide and Crisis Lifeline: 988   Patient verbalizes understanding of instructions and care plan provided today and agrees to view in [place]. Active MyChart status and patient understanding of how to access instructions and care plan via MyChart confirmed with patient.     No further follow up required: decline  Jone Baseman, RN, MSN [facility] Care Management Coordinator Direct Line [phone]

## 2022-04-09 NOTE — Patient Outreach (Signed)
  Care Coordination   Initial Visit Note   04/09/2022 Name: TAMBERLYN WALTERS MRN: [id number] DOB: September 22, 1940  Cheryl Cooper is a 82 y.o. year old female who sees Libby Maw, MD for primary care. I spoke with  [name] by phone today.  What matters to the patients health and wellness today?  none    Goals Addressed             This Visit's Progress    COMPLETED: Care Coordination Activities-No follow up required       Interventions Today    Flowsheet Row Most Recent Value  Chronic Disease   Chronic disease during today's visit Diabetes  General Interventions   General Interventions Discussed/Reviewed General Interventions Discussed, Health Screening, Doctor Visits  Doctor Visits Discussed/Reviewed Doctor Visits Discussed  Health Screening Colonoscopy, Mammogram  Education Interventions   Education Provided Provided Education  Provided Verbal Education On Nutrition  Nutrition Interventions   Nutrition Discussed/Reviewed Nutrition Discussed, Decreasing sugar intake, Carbohydrate meal planning  Pharmacy Interventions   Pharmacy Dicussed/Reviewed Medications and their functions  Safety Interventions   Safety Discussed/Reviewed Fall Risk, Safety Discussed              SDOH assessments and interventions completed:  Yes  SDOH Interventions Today    Flowsheet Row Most Recent Value  SDOH Interventions   Food Insecurity Interventions Intervention Not Indicated  Housing Interventions Intervention Not Indicated  Transportation Interventions Intervention Not Indicated        Care Coordination Interventions:  Yes, provided   Follow up plan: No further intervention required.   Encounter Outcome:  Pt. Visit Completed   Jone Baseman, RN, MSN [facility] Care Management Coordinator Direct Line [phone]

## 2022-04-12 ENCOUNTER — Encounter: Payer: Self-pay | Admitting: Cardiovascular Disease

## 2022-04-12 ENCOUNTER — Ambulatory Visit: Payer: Medicare PPO | Attending: Cardiovascular Disease | Admitting: Cardiovascular Disease

## 2022-04-12 VITALS — BP 134/62 | HR 63 | Ht 63.0 in | Wt 142.6 lb

## 2022-04-12 DIAGNOSIS — I350 Nonrheumatic aortic (valve) stenosis: Secondary | ICD-10-CM

## 2022-04-12 DIAGNOSIS — E785 Hyperlipidemia, unspecified: Secondary | ICD-10-CM

## 2022-04-12 DIAGNOSIS — Z952 Presence of prosthetic heart valve: Secondary | ICD-10-CM

## 2022-04-12 DIAGNOSIS — Z2989 Encounter for other specified prophylactic measures: Secondary | ICD-10-CM

## 2022-04-12 NOTE — Patient Instructions (Signed)
Medication Instructions:  The current medical regimen is effective;  continue present plan and medications.  *If you need a refill on your cardiac medications before your next appointment, please call your pharmacy*   Follow-Up: At [facility], you and your health needs are our priority.  As part of our continuing mission to provide you with exceptional heart care, we have created designated Provider Care Teams.  These Care Teams include your primary Cardiologist (physician) and Advanced Practice Providers (APPs -  Physician Assistants and Nurse Practitioners) who all work together to provide you with the care you need, when you need it.  We recommend signing up for the patient portal called "MyChart".  Sign up information is provided on this After Visit Summary.  MyChart is used to connect with patients for Virtual Visits (Telemedicine).  Patients are able to view lab/test results, encounter notes, upcoming appointments, etc.  Non-urgent messages can be sent to your provider as well.   To learn more about what you can do with MyChart, go to [email or web address].    Your next appointment:   12 month(s)  Provider:   Evalina Field, MD

## 2022-04-17 DIAGNOSIS — I10 Essential (primary) hypertension: Secondary | ICD-10-CM | POA: Diagnosis not present

## 2022-04-17 DIAGNOSIS — E119 Type 2 diabetes mellitus without complications: Secondary | ICD-10-CM | POA: Diagnosis not present

## 2022-04-17 DIAGNOSIS — E039 Hypothyroidism, unspecified: Secondary | ICD-10-CM | POA: Diagnosis not present

## 2022-04-17 DIAGNOSIS — M8580 Other specified disorders of bone density and structure, unspecified site: Secondary | ICD-10-CM | POA: Diagnosis not present

## 2022-05-01 DIAGNOSIS — M48061 Spinal stenosis, lumbar region without neurogenic claudication: Secondary | ICD-10-CM | POA: Diagnosis not present

## 2022-05-01 DIAGNOSIS — M4310 Spondylolisthesis, site unspecified: Secondary | ICD-10-CM | POA: Diagnosis not present

## 2022-05-14 ENCOUNTER — Other Ambulatory Visit: Payer: Self-pay | Admitting: Family Medicine

## 2022-05-16 DIAGNOSIS — M4310 Spondylolisthesis, site unspecified: Secondary | ICD-10-CM | POA: Diagnosis not present

## 2022-05-16 DIAGNOSIS — M5126 Other intervertebral disc displacement, lumbar region: Secondary | ICD-10-CM | POA: Diagnosis not present

## 2022-05-16 DIAGNOSIS — M5137 Other intervertebral disc degeneration, lumbosacral region: Secondary | ICD-10-CM | POA: Diagnosis not present

## 2022-05-16 DIAGNOSIS — M4126 Other idiopathic scoliosis, lumbar region: Secondary | ICD-10-CM | POA: Diagnosis not present

## 2022-05-20 DIAGNOSIS — M65341 Trigger finger, right ring finger: Secondary | ICD-10-CM | POA: Diagnosis not present

## 2022-05-20 DIAGNOSIS — M189 Osteoarthritis of first carpometacarpal joint, unspecified: Secondary | ICD-10-CM | POA: Diagnosis not present

## 2022-05-20 DIAGNOSIS — Z4789 Encounter for other orthopedic aftercare: Secondary | ICD-10-CM | POA: Diagnosis not present

## 2022-05-21 ENCOUNTER — Ambulatory Visit
Admission: RE | Admit: 2022-05-21 | Discharge: 2022-05-21 | Disposition: A | Discharge status: Home / Self Care | Payer: Medicare PPO | Source: Ambulatory Visit | Attending: Gastroenterology | Admitting: Gastroenterology

## 2022-05-21 DIAGNOSIS — Z9049 Acquired absence of other specified parts of digestive tract: Secondary | ICD-10-CM | POA: Diagnosis not present

## 2022-05-21 DIAGNOSIS — K7460 Unspecified cirrhosis of liver: Secondary | ICD-10-CM | POA: Diagnosis not present

## 2022-05-23 DIAGNOSIS — M47816 Spondylosis without myelopathy or radiculopathy, lumbar region: Secondary | ICD-10-CM | POA: Diagnosis not present

## 2022-05-23 DIAGNOSIS — M48061 Spinal stenosis, lumbar region without neurogenic claudication: Secondary | ICD-10-CM | POA: Diagnosis not present

## 2022-05-24 ENCOUNTER — Other Ambulatory Visit: Payer: Self-pay | Admitting: Family Medicine

## 2022-05-24 DIAGNOSIS — F32A Depression, unspecified: Secondary | ICD-10-CM

## 2022-05-24 NOTE — Telephone Encounter (Signed)
Refill request on pending Rx last OV 07/26/21 last refill 02/22/22. Please advise patient scheduled for follow up appointment this month

## 2022-06-03 ENCOUNTER — Encounter: Payer: Self-pay | Admitting: Family Medicine

## 2022-06-03 ENCOUNTER — Ambulatory Visit: Payer: Medicare PPO | Admitting: Family Medicine

## 2022-06-03 VITALS — BP 130/64 | HR 55 | Temp 98.1°F | Ht 63.0 in | Wt 141.0 lb

## 2022-06-03 DIAGNOSIS — E538 Deficiency of other specified B group vitamins: Secondary | ICD-10-CM

## 2022-06-03 DIAGNOSIS — E7800 Pure hypercholesterolemia, unspecified: Secondary | ICD-10-CM | POA: Diagnosis not present

## 2022-06-03 DIAGNOSIS — E611 Iron deficiency: Secondary | ICD-10-CM

## 2022-06-03 DIAGNOSIS — F32A Depression, unspecified: Secondary | ICD-10-CM | POA: Diagnosis not present

## 2022-06-03 MED ORDER — TRAZODONE HCL 50 MG PO TABS: 25.0000 mg | ORAL_TABLET | Freq: Every day | ORAL | 1 refills | Status: DC

## 2022-06-03 NOTE — Progress Notes (Signed)
Established Patient Office Visit   Subjective:  Patient ID: Cheryl Cooper, female    DOB: 1940-07-15  Age: 82 y.o. MRN: [id number]  Chief Complaint  Patient presents with   Medication Consultation    HPI Encounter Diagnoses  Name Primary?   Iron deficiency Yes   Depression, unspecified depression type    B12 deficiency    Elevated LDL cholesterol level    For follow-up of above.  Continues follow-up with endocrinology for diabetes thyroidism.  She is seeing cardiology once yearly.  Use trazodone and Lexapro for depression and anxiety.  Continues iron and B12 therapy.   Review of Systems  Constitutional: Negative.   HENT: Negative.    Eyes:  Negative for blurred vision, discharge and redness.  Respiratory: Negative.    Cardiovascular: Negative.   Gastrointestinal:  Negative for abdominal pain.  Genitourinary: Negative.   Musculoskeletal:  Positive for back pain. Negative for myalgias.  Skin:  Negative for rash.  Neurological:  Negative for tingling, loss of consciousness and weakness.  Endo/Heme/Allergies:  Negative for polydipsia.     Current Outpatient Medications:    amLODipine (NORVASC) 5 MG tablet, TAKE 1 AND 1/2 TABLET BY MOUTH DAILY, Disp: 135 tablet, Rfl: 0   amoxicillin (AMOXIL) 500 MG tablet, Take 2,000 mg by mouth as directed. Take 4 capsules (2000 mg) by mouth 1 hour prior to dental appointments, Disp: , Rfl:    aspirin 81 MG chewable tablet, Chew 1 tablet (81 mg total) by mouth daily., Disp: , Rfl:    Calcium Carbonate-Vitamin D (CALCIUM-D PO), Take 2 tablets by mouth in the morning. CHEWABLES, Disp: , Rfl:    Cholecalciferol 50 MCG (2000 UT) TABS, Take 2,000 Units by mouth in the morning., Disp: , Rfl:    Cyanocobalamin (B-12 PO), Take 1 tablet by mouth in the morning., Disp: , Rfl:    escitalopram (LEXAPRO) 20 MG tablet, TAKE 1 TABLET BY MOUTH DAILY, Disp: 90 tablet, Rfl: 0   esomeprazole (NEXIUM) 40 MG capsule, TAKE 1 CAPSULE BY MOUTH TWICE A DAY BEFORE A  MEAL, Disp: 180 capsule, Rfl: 0   ezetimibe (ZETIA) 10 MG tablet, Take 1 tablet (10 mg total) by mouth daily., Disp: 90 tablet, Rfl: 2   ferrous sulfate 325 (65 FE) MG tablet, Take 325 mg by mouth daily with breakfast., Disp: , Rfl:    FIBER ADULT GUMMIES PO, Take 2 tablets by mouth in the morning. Fiber Well Gummy Supplement, Disp: , Rfl:    GINKGO BILOBA PO, Take 1 capsule by mouth in the morning., Disp: , Rfl:    glucose blood test strip, daily., Disp: , Rfl:    HYDROcodone-acetaminophen (NORCO) 10-325 MG tablet, Take 1 tablet by mouth every 4 (four) hours as needed (pain)., Disp: 30 tablet, Rfl: 0   levothyroxine (SYNTHROID) 75 MCG tablet, Take 1 tablet (75 mcg total) by mouth daily with breakfast., Disp: 90 tablet, Rfl: 2   Multiple Vitamins-Minerals (WOMENS MULTI GUMMIES PO), Take 2 tablets by mouth daily., Disp: , Rfl:    oxybutynin (DITROPAN XL) 15 MG 24 hr tablet, TAKE 1 TABLET BY MOUTH AT BEDTIME, Disp: 90 tablet, Rfl: 1   rosuvastatin (CRESTOR) 10 MG tablet, TAKE ONE TABLET BY MOUTH DAILY, Disp: 90 tablet, Rfl: 3   traZODone (DESYREL) 50 MG tablet, Take 0.5 tablets (25 mg total) by mouth at bedtime., Disp: 45 tablet, Rfl: 1   Objective:     BP 130/64 (BP Location: Left Arm, Patient Position: Sitting, Cuff Size: Normal)  Pulse (!) 55   Temp 98.1 F (36.7 C) (Oral)   Ht 5\' 3"  (1.6 m)   Wt 141 lb (64 kg)   SpO2 94%   BMI 24.98 kg/m  BP Readings from Last 3 Encounters:  06/03/22 130/64  04/12/22 134/62  03/28/22 100/70   Wt Readings from Last 3 Encounters:  06/03/22 141 lb (64 kg)  04/12/22 142 lb 9.6 oz (64.7 kg)  01/25/22 142 lb 8 oz (64.6 kg)      Physical Exam Constitutional:      General: She is not in acute distress.    Appearance: Normal appearance. She is not ill-appearing, toxic-appearing or diaphoretic.  HENT:     Head: Normocephalic and atraumatic.     Right Ear: External ear normal.     Left Ear: External ear normal.  Eyes:     General: No scleral  icterus.       Right eye: No discharge.        Left eye: No discharge.     Extraocular Movements: Extraocular movements intact.     Conjunctiva/sclera: Conjunctivae normal.  Cardiovascular:     Rate and Rhythm: Normal rate and regular rhythm.  Pulmonary:     Effort: Pulmonary effort is normal. No respiratory distress.     Breath sounds: Normal breath sounds.  Skin:    General: Skin is warm and dry.  Neurological:     Mental Status: She is alert and oriented to person, place, and time.  Psychiatric:        Mood and Affect: Mood normal.        Behavior: Behavior normal.      No results found for any visits on 06/03/22.    The ASCVD Risk score (Arnett DK, et al., 2019) failed to calculate for the following reasons:   The patient has a prior MI or stroke diagnosis    Assessment & Plan:   Iron deficiency -     Urinalysis, Routine w reflex microscopic; Future -     Iron, TIBC and Ferritin Panel  Depression, unspecified depression type -     traZODone HCl; Take 0.5 tablets (25 mg total) by mouth at bedtime.  Dispense: 45 tablet; Refill: 1  B12 deficiency -     Vitamin B12; Future  Elevated LDL cholesterol level -     Comprehensive metabolic panel; Future -     Lipid panel; Future    Return in about 6 months (around 12/03/2022).    Mliss Sax, MD

## 2022-06-04 ENCOUNTER — Ambulatory Visit: Payer: Medicare PPO | Admitting: Diagnostic Neuroimaging

## 2022-06-04 ENCOUNTER — Encounter: Payer: Self-pay | Admitting: Diagnostic Neuroimaging

## 2022-06-04 VITALS — BP 145/64 | HR 60 | Ht 63.0 in | Wt 141.0 lb

## 2022-06-04 DIAGNOSIS — R269 Unspecified abnormalities of gait and mobility: Secondary | ICD-10-CM

## 2022-06-04 DIAGNOSIS — R413 Other amnesia: Secondary | ICD-10-CM

## 2022-06-04 DIAGNOSIS — M5416 Radiculopathy, lumbar region: Secondary | ICD-10-CM | POA: Diagnosis not present

## 2022-06-04 NOTE — Patient Instructions (Addendum)
MEMORY LOSS / NAVIGATION DIFFICULTY - MCI vs mild dementia (DLB vs AD?) - caution with driving, finances, ADLs; monitor symptoms  GAIT DIFFICULTY  (brainstem stroke + deconditioning + arthritis + lumbar spine disease) - use cane / walker; follow up PT exercises  STROKE PREVENTION (stable) - continue BP, lipid control - continue aspirin  daily

## 2022-06-04 NOTE — Progress Notes (Signed)
[facility]  PATIENT: Cheryl Cooper DOB: 01-22-1941  REFERRING CLINICIAN: Mliss Sax,*  HISTORY FROM: patient and husband REASON FOR VISIT: follow up   HISTORICAL  CHIEF COMPLAINT:  Chief Complaint  Patient presents with   Follow-up    Patient in room #6 with her husband. Patient states she here today for a memory issues.    HISTORY OF PRESENT ILLNESS:   UPDATE (06/04/22, VRP): Since last visit, had 5 surgeries last year. More memory loss issues. More gait and walking issues.  UPDATE (11/08/19, VRP): Since last visit, doing well until 10/09/19; fell on a moving bus back from [place]. Had mild concussion, with nausea. Now gradually improving. Memory loss improving. Balance still affected. Using walker at night.   UPDATE (09/24/17, VRP): Since last visit, had right hip surgery for right hip fracture, s/p surgery, now balance is better. Some HA and memory loss issues. Symptoms are mild. No alleviating or aggravating factors. Tolerating meds.    HA are left sided, triggered by stress. More stress lately. Throbbing. No nausea / vomiting. No photophobia or phonophobia. Takes tylenol.   Patient does not feel memory loss. No decline in ADLs.   UPDATE 02/15/16: Since last visit, balance is getting worse. Esp with bending, lifting, climbing. Has fallen down several times. Leans to the right side.   UPDATE 07/04/15: Since last visit, doing better with balance, except fell off treadmill x 1 --> 3 weeks ago. Still with intermittent insomnia, worry/anxiety at night.  UPDATE 02/14/15: Since last visit, balance still off. Still with blood pressure fluctuations, esp high BP in evening.   UPDATE 01/04/15: Since last visit, symptoms are improved. Still with general balance diff, esp in early AM and uneven surfaces. Not on daily aspirin. Has done PT eval.   PRIOR HPI (12/02/14): 82 year old right-handed female here for evaluation of postconcussion syndrome. 11/17/2014  patient was visiting family in [place], in the garage putting a broom away. All of a sudden she felt very dizzy and felt like she was losing her balance. She does not remember exactly what happened next. Apparently she staggered backwards, fell down onto the concrete floor and had a severe scalp laceration. Patient tried to stand up from laying position but was unable to sit up. She felt very swimmy headed. Ambulance was called by her family and she is taken to local emergency room. Her blood pressure was noted to be 189/90. She had CT scan of the head which was apparently unremarkable. EKG and blood testing were unremarkable. She had "staples" to her scalp and was discharged from the emergency room. Since then patient has continued to have dizziness, pressure on the bitemporal region, neck pain, slowness of words, physical movements and balance. No vision changes. Over the past 6 months patient has had increasing balance and gait difficulties. She fell twice in the last 6 months. She's been having more balance difficulties in general. More close calls and stumbling/stripping on objects.   REVIEW OF SYSTEMS: Full 14 system review of systems performed and negative: except as per HPI.     ALLERGIES: Allergies  Allergen Reactions   Other Other (See Comments) and Rash    Tomato sauce, garlic, onion - severe acid reflux    Flexeril [Cyclobenzaprine] Other (See Comments)    Per spouse "she felt like a zombie"   Chlordiazepoxide-Clidinium Other (See Comments)    Dizziness (intolerance)   Codeine Nausea Only and Rash   Lipitor [Atorvastatin] Other (See Comments)    Unbalanced  HOME MEDICATIONS: Outpatient Medications Prior to Visit  Medication Sig Dispense Refill   amLODipine (NORVASC) 5 MG tablet TAKE 1 AND 1/2 TABLET BY MOUTH DAILY 135 tablet 0   amoxicillin (AMOXIL) 500 MG tablet Take 2,000 mg by mouth as directed. Take 4 capsules (2000 mg) by mouth 1 hour prior to dental appointments      aspirin 81 MG chewable tablet Chew 1 tablet (81 mg total) by mouth daily.     Calcium Carbonate-Vitamin D (CALCIUM-D PO) Take 2 tablets by mouth in the morning. CHEWABLES     escitalopram (LEXAPRO) 20 MG tablet TAKE 1 TABLET BY MOUTH DAILY 90 tablet 0   esomeprazole (NEXIUM) 40 MG capsule TAKE 1 CAPSULE BY MOUTH TWICE A DAY BEFORE A MEAL 180 capsule 0   ezetimibe (ZETIA) 10 MG tablet Take 1 tablet (10 mg total) by mouth daily. 90 tablet 2   ferrous sulfate 325 (65 FE) MG tablet Take 325 mg by mouth daily with breakfast.     FIBER ADULT GUMMIES PO Take 2 tablets by mouth in the morning. Fiber Well Gummy Supplement     GINKGO BILOBA PO Take 1 capsule by mouth in the morning.     glucose blood test strip daily.     HYDROcodone-acetaminophen (NORCO) 10-325 MG tablet Take 1 tablet by mouth every 4 (four) hours as needed (pain). 30 tablet 0   levothyroxine (SYNTHROID) 75 MCG tablet Take 1 tablet (75 mcg total) by mouth daily with breakfast. 90 tablet 2   Multiple Vitamins-Minerals (WOMENS MULTI GUMMIES PO) Take 2 tablets by mouth daily.     oxybutynin (DITROPAN XL) 15 MG 24 hr tablet TAKE 1 TABLET BY MOUTH AT BEDTIME 90 tablet 1   rosuvastatin (CRESTOR) 10 MG tablet TAKE ONE TABLET BY MOUTH DAILY 90 tablet 3   traZODone (DESYREL) 50 MG tablet Take 0.5 tablets (25 mg total) by mouth at bedtime. 45 tablet 1   Cholecalciferol 50 MCG (2000 UT) TABS Take 2,000 Units by mouth in the morning. (Patient not taking: Reported on 06/04/2022)     Cyanocobalamin (B-12 PO) Take 1 tablet by mouth in the morning. (Patient not taking: Reported on 06/04/2022)     No facility-administered medications prior to visit.    PAST MEDICAL HISTORY: Past Medical History:  Diagnosis Date   Anxiety    Arthritis    Carpal tunnel syndrome    Deafness in left ear    Depression    Diabetes mellitus    diet controlled   Diverticulitis    GERD (gastroesophageal reflux disease)    Hypercholesteremia    Hypertension     Hypothyroid    Memory loss    reports d/t concussion   PONV (postoperative nausea and vomiting) yrs ago, none recent   Post concussion syndrome    S/P TAVR (transcatheter aortic valve replacement) 11/13/2021   s/p TAVR with a 23 mm Edwards S3UR via the TF approach by Dr. Excell Seltzer and Dr. Leafy Ro   Severe aortic stenosis     PAST SURGICAL HISTORY: Past Surgical History:  Procedure Laterality Date   ABDOMINAL HYSTERECTOMY  1986   ANTERIOR LATERAL LUMBAR FUSION WITH PERCUTANEOUS SCREW 1 LEVEL Left 01/25/2022   Procedure: ANTERIOR LATERAL LUMBAR FUSION WITH PERCUTANEOUS SCREW LUMBAR THREE-FOUR;  Surgeon: Julio Sicks, MD;  Location: [facility] OR;  Service: Neurosurgery;  Laterality: Left;   BACK SURGERY  2010   lower   BIOPSY  01/12/2018   Procedure: BIOPSY;  Surgeon: Meridee Score Netty Starring., MD;  Location:  [facility];  Service: Gastroenterology;;   CARPAL TUNNEL RELEASE Right 2023   CHOLECYSTECTOMY     ESOPHAGOGASTRODUODENOSCOPY (EGD) WITH PROPOFOL N/A 01/12/2018   Procedure: ESOPHAGOGASTRODUODENOSCOPY (EGD) WITH PROPOFOL;  Surgeon: Meridee Score Netty Starring., MD;  Location: [facility];  Service: Gastroenterology;  Laterality: N/A;   EYE SURGERY Right    cataract   INTRAOPERATIVE TRANSTHORACIC ECHOCARDIOGRAM N/A 11/13/2021   Procedure: INTRAOPERATIVE TRANSTHORACIC ECHOCARDIOGRAM;  Surgeon: Tonny Bollman, MD;  Location: [facility] OR;  Service: Open Heart Surgery;  Laterality: N/A;   left elobow surgery  2003   left rotator cuff  2001   LUMBAR LAMINECTOMY/DECOMPRESSION MICRODISCECTOMY Left 12/23/2017   Procedure: Laminectomy for facet/synovial cyst - left - Lumbar three-Lumbar four;  Surgeon: Julio Sicks, MD;  Location: [facility] OR;  Service: Neurosurgery;  Laterality: Left;   r foot surgery  1995   REVERSE SHOULDER ARTHROPLASTY Left 10/23/2018   Procedure: REVERSE TOTAL SHOULDER ARTHROPLASTY;  Surgeon: Beverely Low, MD;  Location: [facility];  Service: Orthopedics;  Laterality: Left;  interscalene block    REVERSE SHOULDER ARTHROPLASTY Right 04/20/2021   Procedure: REVERSE SHOULDER ARTHROPLASTY;  Surgeon: Beverely Low, MD;  Location: [facility];  Service: Orthopedics;  Laterality: Right;  with ISB   right hand surgery  2001   RIGHT HEART CATH AND CORONARY ANGIOGRAPHY N/A 10/24/2021   Procedure: RIGHT HEART CATH AND CORONARY ANGIOGRAPHY;  Surgeon: Tonny Bollman, MD;  Location: [facility] INVASIVE CV LAB;  Service: Cardiovascular;  Laterality: N/A;   right index finger surgery  2009   SHOULDER OPEN ROTATOR CUFF REPAIR  11/21/2011   Procedure: ROTATOR CUFF REPAIR SHOULDER OPEN;  Surgeon: Javier Docker, MD;  Location: [facility];  Service: Orthopedics;  Laterality: Right;  with subacromial decompression   SHOULDER OPEN ROTATOR CUFF REPAIR Right 05/09/2016   Procedure: Right shoulder mini open revision rotator cuff repair, subacromial decompression;  Surgeon: Jene Every, MD;  Location: [facility];  Service: Orthopedics;  Laterality: Right;   TOTAL HIP ARTHROPLASTY Right 04/24/2017   Procedure: RIGHT TOTAL HIP ARTHROPLASTY ANTERIOR APPROACH;  Surgeon: Samson Frederic, MD;  Location: [facility];  Service: Orthopedics;  Laterality: Right;  Needs RNFA   TRANSCATHETER AORTIC VALVE REPLACEMENT, TRANSFEMORAL N/A 11/13/2021   Procedure: Transcatheter Aortic Valve Replacement, Transfemoral Edwards 23 MM SAPIEN 3 Ultra;  Surgeon: Tonny Bollman, MD;  Location: [facility] OR;  Service: Open Heart Surgery;  Laterality: N/A;  Transfemoral approach    FAMILY HISTORY: Family History  Problem Relation Age of Onset   Heart attack Mother    Stroke Father    Heart attack Father    Stroke Sister    Stroke Brother    Diabetes Brother    Dementia Brother    Heart disease Brother    Stroke Sister        TIA    SOCIAL HISTORY:  Social History   Socioeconomic History   Marital status: Married    Spouse name: Herschel   Number of children: 3   Years of education: 14   Highest education level: Not on file  Occupational History    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  Tobacco Use   Smoking status: Never   Smokeless tobacco: Never  Building services engineer Use: Never used  Substance and Sexual Activity   Alcohol use: Not Currently    Comment: occas   Drug use: No   Sexual activity: Not Currently    Partners: Male    Birth control/protection: Surgical    Comment: 1st  intercourse 82 yo-Fewer than 5 partners, hysterectomy  Other Topics Concern   Not on file  Social History Narrative   Married, lives at home with husband   caffeine - 1 Coke daily   Social Determinants of Health   Financial Resource Strain: Not on file  Food Insecurity: No Food Insecurity (04/09/2022)   Hunger Vital Sign    Worried About Running Out of Food in the Last Year: Never true    Ran Out of Food in the Last Year: Never true  Transportation Needs: No Transportation Needs (04/09/2022)   PRAPARE - Administrator, Civil Service (Medical): No    Lack of Transportation (Non-Medical): No  Physical Activity: Not on file  Stress: Not on file  Social Connections: Not on file  Intimate Partner Violence: Not At Risk (01/25/2022)   Humiliation, Afraid, Rape, and Kick questionnaire    Fear of Current or Ex-Partner: No    Emotionally Abused: No    Physically Abused: No    Sexually Abused: No     PHYSICAL EXAM  GENERAL EXAM/CONSTITUTIONAL: Vitals:  Vitals:   06/04/22 1324  BP: (!) 145/64  Pulse: 60  Weight: 141 lb (64 kg)  Height: 5\' 3"  (1.6 m)   Body mass index is 24.98 kg/m. Wt Readings from Last 3 Encounters:  06/04/22 141 lb (64 kg)  06/03/22 141 lb (64 kg)  04/12/22 142 lb 9.6 oz (64.7 kg)   Patient is in no distress; well developed, nourished and groomed; neck is supple  CARDIOVASCULAR: Examination of carotid arteries is normal; no carotid bruits Regular rate and rhythm, no murmurs Examination of peripheral vascular system by observation and palpation is normal  EYES: Ophthalmoscopic exam of  optic discs and posterior segments is normal; no papilledema or hemorrhages No results found.  MUSCULOSKELETAL: Gait, strength, tone, movements noted in Neurologic exam below  NEUROLOGIC: MENTAL STATUS:     06/04/2022    1:29 PM 09/24/2017   10:29 AM  MMSE - Mini Mental State Exam  Orientation to time 3 4  Orientation to Place 5 4  Registration 3 3  Attention/ Calculation 1 2  Recall 1 1  Language- name 2 objects 2 2  Language- repeat 1 1  Language- follow 3 step command 3 3  Language- read & follow direction 1 1  Write a sentence 0 1  Copy design 0 0  Total score 20 22   awake, alert, oriented to person, place and time recent and remote memory intact normal attention and concentration language fluent, comprehension intact, naming intact fund of knowledge appropriate  CRANIAL NERVE:  2nd - no papilledema on fundoscopic exam 2nd, 3rd, 4th, 6th - pupils equal and reactive to light, visual fields full to confrontation, extraocular muscles intact, no nystagmus 5th - facial sensation symmetric 7th - facial strength symmetric 8th - hearing intact 9th - palate elevates symmetrically, uvula midline 11th - shoulder shrug symmetric 12th - tongue protrusion midline MASKED FACIES  MOTOR:  INCREASED TONE IN RUE > LUE MOD BRADYKINESIA IN RLE > LLE NO TREMOR full strength in the BUE, BLE  SENSORY:  normal and symmetric to light touch, temperature, vibration  COORDINATION:  finger-nose-finger, fine finger movements normal  REFLEXES:  deep tendon reflexes BRISK IN RUE AND RLE KNEE; TRACE IN LEGS  GAIT/STATION:  narrow based gait; UNSTEADY; SLIGHTLY SPASTIC GAIT; SHORT STEPS      DIAGNOSTIC DATA (LABS, IMAGING, TESTING) - I reviewed patient records, labs, notes, testing and imaging myself where  available.  Lab Results  Component Value Date   WBC 6.9 01/22/2022   HGB 13.9 01/22/2022   HCT 41.0 01/22/2022   MCV 88.9 01/22/2022   PLT 153 01/22/2022      Component  Value Date/Time   NA 138 01/22/2022 1430   NA 138 10/16/2021 1641   K 3.9 01/22/2022 1430   CL 104 01/22/2022 1430   CO2 28 01/22/2022 1430   GLUCOSE 109 (H) 01/22/2022 1430   BUN 11 01/22/2022 1430   BUN 12 10/16/2021 1641   CREATININE 0.97 01/22/2022 1430   CALCIUM 9.4 01/22/2022 1430   PROT 6.9 11/09/2021 1215   PROT 7.3 05/03/2020 1051   ALBUMIN 3.6 11/09/2021 1215   ALBUMIN 4.1 05/03/2020 1051   AST 38 11/09/2021 1215   ALT 37 11/09/2021 1215   ALKPHOS 49 11/09/2021 1215   BILITOT 0.6 11/09/2021 1215   BILITOT 0.2 05/03/2020 1051   GFRNONAA 59 (L) 01/22/2022 1430   GFRAA 75 04/05/2020 1241   Lab Results  Component Value Date   CHOL 150 07/28/2020   HDL 47.50 07/28/2020   LDLCALC 63 07/28/2020   LDLDIRECT 53.0 03/20/2021   TRIG 193.0 (H) 07/28/2020   CHOLHDL 3 07/28/2020   Lab Results  Component Value Date   HGBA1C 6.4 (H) 01/22/2022   Lab Results  Component Value Date   VITAMINB12 >1504 (H) 06/25/2021   Lab Results  Component Value Date   TSH 2.24 03/20/2021     10/09/08 MRI LUMBAR [I reviewed images myself and agree with interpretation. -VRP]  1.  Advanced degenerative disc disease at L5-S1 with a broad-based bulging annulus and osteophytic ridging contributing to mild foraminal stenosis bilaterally, left greater than right.  2.  Annular rent and central disc protrusion at L4-5.  This in combination with hypertrophic facet disease creates mild to moderate right lateral recess stenosis and could impinge on the right L5 nerve root. 3.  Broad-based bulging annulus at L3-4 but no significant spinal or foraminal stenosis.  12/08/14 MRI brain [I reviewed images myself and agree with interpretation. Chronic ischemic changes. Nothing acute. -VRP]  1.   Foci in the left mid pons and left lower midbrain consistent with chronic lacunar infarctions. 2.   Moderate number of T2/FLAIR hyperintense foci in the hemispheres most consistent with chronic microvascular ischemic  change. The extent is more than expected for age. 3.  Mild cortical atrophy 4.  There are no acute findings.  12/08/14 MRI cervical spine: [I reviewed images myself and agree with interpretation. No cord issues noted. -VRP]  1.  At C3-C4, there is moderate right foraminal narrowing due to mild disc bulging and facet hypertrophy. Although there is no definite nerve root compression there is some encroachment upon the exiting right C4 nerve root. 2.  At C6-C7 there is moderate loss of disc height, broad disc bulging, mild uncovertebral spurring. This leads to moderate left foraminal narrowing. Although there is no definite nerve root compression there is some encroachment upon the exiting left C7 nerve root. 3.   Degenerative changes at C4-C5 and C5-C6 are less likely to lead to nerve root impingement. 4.  The spinal cord appears normal.   5.   A possible chronic lacunar infarction in the pons is noted on sagittal images. 6.   There are no acute findings.  12/08/14 carotid u/s  - no ICA stenosis  12/16/14 TTE  - Vigorous LV systolic function; grade 1 diastolic dysfunction; aortic sclerosis.  11/14/19 Abnormal MRI scan cervical  spine showing prominent spondylitic changes at C5-6 and C6-7 resulting in mild canal and bilateral foraminal narrowing. These appear slightly progressed compared with previous MRI from 12/08/2014. Incidental lacunar infarcts are noted in the pons.      ASSESSMENT AND PLAN  82 y.o. year old female here with progressive gait and balance difficulty, with episode of lightheadedness, dizziness, syncope, resulting in head trauma and postconcussion syndrome, now improved.  Gait and balance difficulty problem and neurologic exam findings of gait difficulty, masked facies, decreased arm swing, raise possibility of parkinsonism but not clear cut (? parkinson's plus vs dementia with lewy bodies). Also balance issues could be related to prior brainstem stroke sequelae.  The event  on 11/13/2014 of dizziness and passing out, leading to head trauma and concussion, could have been either TIA, syncope or balance difficulty due to prior brainstem strokes.    Dx:  Memory loss  Gait difficulty    PLAN:  MEMORY LOSS / NAVIGATION DIFFICULTY - MCI vs mild dementia (DLB vs AD?) - caution with driving, finances, medications, ADLs; ask spouse to monitor symptoms  GAIT DIFFICULTY  (brainstem stroke + deconditioning + arthritis + lumbar spine disease) - use cane / walker; follow up PT exercises  STROKE PREVENTION (stable) - continue BP, lipid control - continue aspirin  daily  Return for return to PCP, pending if symptoms worsen or fail to improve.    Suanne Marker, MD 06/04/2022, 1:53 PM Certified in Neurology, Neurophysiology and Neuroimaging  [facility] [address] [place], [place] [address] [phone]

## 2022-06-05 ENCOUNTER — Other Ambulatory Visit (INDEPENDENT_AMBULATORY_CARE_PROVIDER_SITE_OTHER): Payer: Medicare PPO

## 2022-06-05 ENCOUNTER — Telehealth: Payer: Self-pay | Admitting: Family Medicine

## 2022-06-05 DIAGNOSIS — E538 Deficiency of other specified B group vitamins: Secondary | ICD-10-CM

## 2022-06-05 DIAGNOSIS — E611 Iron deficiency: Secondary | ICD-10-CM | POA: Diagnosis not present

## 2022-06-05 DIAGNOSIS — E7800 Pure hypercholesterolemia, unspecified: Secondary | ICD-10-CM

## 2022-06-05 DIAGNOSIS — R7309 Other abnormal glucose: Secondary | ICD-10-CM

## 2022-06-05 LAB — LIPID PANEL
Cholesterol: 128 mg/dL (ref 0–200)
HDL: 44.1 mg/dL (ref >39.00)
LDL Cholesterol: 61 mg/dL (ref 0–99)
NonHDL: 83.97
Total CHOL/HDL Ratio: 3
Triglycerides: 113 mg/dL (ref 0.0–149.0)
VLDL: 22.6 mg/dL (ref 0.0–40.0)

## 2022-06-05 LAB — URINALYSIS, ROUTINE W REFLEX MICROSCOPIC
Bilirubin Urine: NEGATIVE
Hgb urine dipstick: NEGATIVE
Ketones, ur: NEGATIVE
Nitrite: NEGATIVE
Specific Gravity, Urine: 1.01 (ref 1.000–1.030)
Total Protein, Urine: NEGATIVE
Urine Glucose: NEGATIVE
Urobilinogen, UA: 0.2 (ref 0.0–1.0)
pH: 6 (ref 5.0–8.0)

## 2022-06-05 LAB — COMPREHENSIVE METABOLIC PANEL
ALT: 24 U/L (ref 0–35)
AST: 24 U/L (ref 0–37)
Albumin: 4.2 g/dL (ref 3.5–5.2)
Alkaline Phosphatase: 38 U/L — ABNORMAL LOW (ref 39–117)
BUN: 15 mg/dL (ref 6–23)
CO2: 26 mEq/L (ref 19–32)
Calcium: 9.6 mg/dL (ref 8.4–10.5)
Chloride: 102 mEq/L (ref 96–112)
Creatinine, Ser: 0.96 mg/dL (ref 0.40–1.20)
GFR: 56.1 mL/min — ABNORMAL LOW (ref >60.00)
Glucose, Bld: 152 mg/dL — ABNORMAL HIGH (ref 70–99)
Potassium: 4.1 mEq/L (ref 3.5–5.1)
Sodium: 138 mEq/L (ref 135–145)
Total Bilirubin: 0.3 mg/dL (ref 0.2–1.2)
Total Protein: 6.9 g/dL (ref 6.0–8.3)

## 2022-06-05 LAB — VITAMIN B12: Vitamin B-12: 437 pg/mL (ref 211–911)

## 2022-06-05 NOTE — Telephone Encounter (Signed)
Pt is wanting a Handicap Tag for her car. Please advise pt at   [phone]

## 2022-06-05 NOTE — Telephone Encounter (Signed)
Please advise 

## 2022-06-06 NOTE — Telephone Encounter (Signed)
Placard filled out/signed and placed up front for pick up.  Patient notified VIA phone.  Dm/cma

## 2022-06-06 NOTE — Addendum Note (Signed)
Addended by: Nadene Rubins A on: 06/06/2022 10:10 AM   Modules accepted: Orders

## 2022-06-13 ENCOUNTER — Other Ambulatory Visit (INDEPENDENT_AMBULATORY_CARE_PROVIDER_SITE_OTHER): Payer: Medicare PPO

## 2022-06-13 DIAGNOSIS — R7309 Other abnormal glucose: Secondary | ICD-10-CM | POA: Diagnosis not present

## 2022-06-13 LAB — HEMOGLOBIN A1C: Hgb A1c MFr Bld: 6.5 % (ref 4.6–6.5)

## 2022-06-14 ENCOUNTER — Other Ambulatory Visit: Payer: Self-pay | Admitting: Family Medicine

## 2022-06-14 DIAGNOSIS — I10 Essential (primary) hypertension: Secondary | ICD-10-CM

## 2022-06-20 ENCOUNTER — Other Ambulatory Visit: Payer: Self-pay | Admitting: Family Medicine

## 2022-06-20 DIAGNOSIS — E7800 Pure hypercholesterolemia, unspecified: Secondary | ICD-10-CM

## 2022-06-24 ENCOUNTER — Other Ambulatory Visit: Payer: Self-pay

## 2022-06-24 DIAGNOSIS — I639 Cerebral infarction, unspecified: Secondary | ICD-10-CM

## 2022-06-24 DIAGNOSIS — E119 Type 2 diabetes mellitus without complications: Secondary | ICD-10-CM

## 2022-06-25 ENCOUNTER — Telehealth: Payer: Self-pay

## 2022-06-25 ENCOUNTER — Telehealth: Payer: Self-pay | Admitting: Family Medicine

## 2022-06-25 ENCOUNTER — Ambulatory Visit: Payer: Medicare PPO | Admitting: Family Medicine

## 2022-06-25 NOTE — Telephone Encounter (Signed)
Contacted Tessie Eke to schedule their annual wellness visit. Appointment made for 07/05/22.  Rudell Cobb AWV direct phone # [phone]

## 2022-06-25 NOTE — Progress Notes (Signed)
Care Management & Coordination Services Pharmacy Team  Reason for Encounter: Appointment Reminder  Contacted patient to confirm in office appointment with Julious Payer, PharmD on 06/27/2022 at 1:00 pm . Spoke with patient on 06/25/2022   Do you have any problems getting your medications? No Patient denies any problems getting her medications.  What is your top health concern you would like to discuss at your upcoming visit?  Overall health, patient denies any side effects/ or concern at this time.  Have you seen any other providers since your last visit with PCP? Yes   Chart review:  Recent office visits:  06/03/2022 Dr. Doreene Burke MD (PCP) No medication changes noted, Lab work Completed, Return in about 6 months  04/09/2022 Fleeta Emmer RN [facility] Coordination) No medication changes noted  Recent consult visits:  06/04/2022 Dr. Marjory Lies MD (Neurology) No medication changes noted 05/20/2022 Dominica Severin [facility]) No medication changes noted 04/17/2022 Dr. Katrinka Blazing MD (Endocrinology) No Medication changes noted, follow up 6 months  04/12/2022 Dr. Val EagleJennette Kettle MD (Cardiology) No Medication changes noted, Return in about 1 year  03/28/2022 Wyline Beady NP (Gynecology) start estradiol (ESTRACE VAGINAL) 0.1 MG/GM vaginal cream 03/13/2022 Arlyss Repress MD (Ophthalmology) Unable to see note 01/08/2022 Bernadene Person NP (Cardiology) No Medication changes noted 01/02/2022 Dr. Zollie Beckers MD (Ophthalmology) No Medication changes noted  Hospital visits:  Medication Reconciliation was completed by comparing discharge summary, patient's EMR and Pharmacy list, and upon discussion with patient.  Admitted to the hospital on 01/25/2022 due to Degenerative Spondylolisthesis. Discharge date was 01/26/2022. Discharged from [facility].    New?Medications Started at [facility] Discharge:?? -started None ID  Medication Changes at Hospital Discharge: -Changed None ID  Medications  Discontinued at Hospital Discharge: -Stopped None ID  Medications that remain the same after Hospital Discharge:??  -All other medications will remain the same.     Star Rating Drugs:  Rosuvastatin 10 mg Last Fill: 06/20/2022 [facility].    Care Gaps: Annual wellness visit in last year? No Last completed 10/05/2018 Diabetic Kidney Evaluation  If Diabetic: Last eye exam / retinopathy screening: Last completed - Never Last diabetic foot exam:Last completed- Never   Everlean Cherry Clinical Pharmacist Assistant [phone]

## 2022-06-26 ENCOUNTER — Telehealth: Payer: Self-pay | Admitting: Family Medicine

## 2022-06-26 NOTE — Telephone Encounter (Signed)
Pt called and wants to cancel her appt tomorrow 5/9

## 2022-06-26 NOTE — Telephone Encounter (Signed)
Appointment cancelled. She will need to be rescheduled.

## 2022-06-27 ENCOUNTER — Telehealth: Payer: Self-pay

## 2022-06-27 NOTE — Progress Notes (Signed)
I reach out to patient to reschedule her initial appointment that was cancel as requested from the Clinical pharmacist.  Patient states she feels she does not need our service at this time, and will reach out to her PCP if she changes her mind.Notified Clinical pharmacist.  Everlean Cherry Clinical Pharmacist Assistant [phone]

## 2022-07-02 DIAGNOSIS — M48061 Spinal stenosis, lumbar region without neurogenic claudication: Secondary | ICD-10-CM | POA: Diagnosis not present

## 2022-07-02 DIAGNOSIS — M4310 Spondylolisthesis, site unspecified: Secondary | ICD-10-CM | POA: Diagnosis not present

## 2022-07-03 ENCOUNTER — Telehealth: Payer: Self-pay | Admitting: *Deleted

## 2022-07-03 ENCOUNTER — Telehealth: Payer: Self-pay

## 2022-07-03 NOTE — Telephone Encounter (Signed)
Spoke with patient who is agreeable to do a tele visit on 5/28 at 10:20 am. Med rec and consent done.

## 2022-07-03 NOTE — Telephone Encounter (Signed)
   Pre-operative Risk Assessment    Patient Name: Cheryl Cooper  DOB: [birth year] MRN: [id number]      Request for Surgical Clearance    Procedure:   L4-5 Lumbar Fusion  Date of Surgery:  Clearance TBD                                 Surgeon:  Dr. Julio Sicks Surgeon's Group or Practice Name:  [facility] Phone number:  [phone] Fax number:  [phone]   Type of Clearance Requested:   - Medical  - Pharmacy:  Hold Aspirin Not Indicated   Type of Anesthesia:  General    Additional requests/questions:    Signed, Emmit Pomfret   07/03/2022, 7:28 AM

## 2022-07-03 NOTE — Telephone Encounter (Signed)
  Patient Consent for Virtual Visit        Cheryl Cooper has provided verbal consent on 07/03/2022 for a virtual visit (video or telephone).   CONSENT FOR VIRTUAL VISIT FOR:  Cheryl Cooper  By participating in this virtual visit I agree to the following:  I hereby voluntarily request, consent and authorize [facility] and its employed or contracted physicians, physician assistants, nurse practitioners or other licensed health care professionals (the Practitioner), to provide me with telemedicine health care services (the "Services") as deemed necessary by the treating Practitioner. I acknowledge and consent to receive the Services by the Practitioner via telemedicine. I understand that the telemedicine visit will involve communicating with the Practitioner through live audiovisual communication technology and the disclosure of certain medical information by electronic transmission. I acknowledge that I have been given the opportunity to request an in-person assessment or other available alternative prior to the telemedicine visit and am voluntarily participating in the telemedicine visit.  I understand that I have the right to withhold or withdraw my consent to the use of telemedicine in the course of my care at any time, without affecting my right to future care or treatment, and that the Practitioner or I may terminate the telemedicine visit at any time. I understand that I have the right to inspect all information obtained and/or recorded in the course of the telemedicine visit and may receive copies of available information for a reasonable fee.  I understand that some of the potential risks of receiving the Services via telemedicine include:  Delay or interruption in medical evaluation due to technological equipment failure or disruption; Information transmitted may not be sufficient (e.g. poor resolution of images) to allow for appropriate medical decision making by the  Practitioner; and/or  In rare instances, security protocols could fail, causing a breach of personal health information.  Furthermore, I acknowledge that it is my responsibility to provide information about my medical history, conditions and care that is complete and accurate to the best of my ability. I acknowledge that Practitioner's advice, recommendations, and/or decision may be based on factors not within their control, such as incomplete or inaccurate data provided by me or distortions of diagnostic images or specimens that may result from electronic transmissions. I understand that the practice of medicine is not an exact science and that Practitioner makes no warranties or guarantees regarding treatment outcomes. I acknowledge that a copy of this consent can be made available to me via my patient portal [facility] MyChart), or I can request a printed copy by calling the office of [facility].    I understand that my insurance will be billed for this visit.   I have read or had this consent read to me. I understand the contents of this consent, which adequately explains the benefits and risks of the Services being provided via telemedicine.  I have been provided ample opportunity to ask questions regarding this consent and the Services and have had my questions answered to my satisfaction. I give my informed consent for the services to be provided through the use of telemedicine in my medical care

## 2022-07-03 NOTE — Telephone Encounter (Signed)
Primary Cardiologist:Empire Cleophus Molt, MD   Preoperative team, please contact this patient and set up a phone call appointment for further preoperative risk assessment. Please obtain consent and complete medication review. Thank you for your help.   Pending no s/s of ACS at time of virtual visit, and per office protocol, she may hold aspirin for 7 days prior to procedure and should resume as soon as hemodynamically stable postoperatively.  Levi Aland, NP-C  07/03/2022, 11:59 AM [address], [address] Office [phone] Fax [phone]

## 2022-07-16 ENCOUNTER — Ambulatory Visit: Payer: Medicare PPO | Attending: Cardiology

## 2022-07-16 DIAGNOSIS — Z01810 Encounter for preprocedural cardiovascular examination: Secondary | ICD-10-CM | POA: Diagnosis not present

## 2022-07-16 NOTE — Progress Notes (Signed)
Virtual Visit via Telephone Note   Because of Cheryl Cooper co-morbid illnesses, she is at least at moderate risk for complications without adequate follow up.  This format is felt to be most appropriate for this patient at this time.  The patient did not have access to video technology/had technical difficulties with video requiring transitioning to audio format only (telephone).  All issues noted in this document were discussed and addressed.  No physical exam could be performed with this format.  Please refer to the patient's chart for her consent to telehealth for [facility].  Evaluation Performed:  Preoperative cardiovascular risk assessment _____________   Date:  07/16/2022   Patient ID:  Cheryl Cooper, DOB 03/11/1940, MRN [id number] Patient Location:  Home Provider location:   Office  Primary Care Provider:  Mliss Sax, MD Primary Cardiologist:  Reatha Harps, MD  Chief Complaint / Patient Profile   82 y.o. y/o female with a h/o severe stenosis status post TAVR, hypertension, diabetes  who is pending L4-5 Lumbar Fusion  and presents today for telephonic preoperative cardiovascular risk assessment.  History of Present Illness    Cheryl Cooper is a 82 y.o. female who presents via audio/video conferencing for a telehealth visit today.  Pt was last seen in cardiology clinic on 04/12/2022 by Dr. Flora Lipps.  At that time Cheryl Cooper was doing well with no new cardiac complaints..  The patient is now pending procedure as outlined above. Since her last visit, she has been completing regular ADLs but is limited due to back pain.  She does use a walker but is able to complete most ADLs without assistance.  She denies chest pain, shortness of breath, lower extremity edema, fatigue, palpitations, melena, hematuria, hemoptysis, diaphoresis, weakness, presyncope, syncope, orthopnea, and PND.    Past Medical History    Past Medical History:  Diagnosis Date    Anxiety    Arthritis    Carpal tunnel syndrome    Deafness in left ear    Depression    Diabetes mellitus    diet controlled   Diverticulitis    GERD (gastroesophageal reflux disease)    Hypercholesteremia    Hypertension    Hypothyroid    Memory loss    reports d/t concussion   PONV (postoperative nausea and vomiting) yrs ago, none recent   Post concussion syndrome    S/P TAVR (transcatheter aortic valve replacement) 11/13/2021   s/p TAVR with a 23 mm Edwards S3UR via the TF approach by Dr. Excell Seltzer and Dr. Leafy Ro   Severe aortic stenosis    Past Surgical History:  Procedure Laterality Date   ABDOMINAL HYSTERECTOMY  1986   ANTERIOR LATERAL LUMBAR FUSION WITH PERCUTANEOUS SCREW 1 LEVEL Left 01/25/2022   Procedure: ANTERIOR LATERAL LUMBAR FUSION WITH PERCUTANEOUS SCREW LUMBAR THREE-FOUR;  Surgeon: Julio Sicks, MD;  Location: [facility] OR;  Service: Neurosurgery;  Laterality: Left;   BACK SURGERY  2010   lower   BIOPSY  01/12/2018   Procedure: BIOPSY;  Surgeon: Meridee Score Netty Starring., MD;  Location: [facility];  Service: Gastroenterology;;   Fidela Salisbury RELEASE Right 2023   CHOLECYSTECTOMY     ESOPHAGOGASTRODUODENOSCOPY (EGD) WITH PROPOFOL N/A 01/12/2018   Procedure: ESOPHAGOGASTRODUODENOSCOPY (EGD) WITH PROPOFOL;  Surgeon: Lemar Lofty., MD;  Location: [facility];  Service: Gastroenterology;  Laterality: N/A;   EYE SURGERY Right    cataract   INTRAOPERATIVE TRANSTHORACIC ECHOCARDIOGRAM N/A 11/13/2021   Procedure: INTRAOPERATIVE TRANSTHORACIC ECHOCARDIOGRAM;  Surgeon: Tonny Bollman,  MD;  Location: [facility] OR;  Service: Open Heart Surgery;  Laterality: N/A;   left elobow surgery  2003   left rotator cuff  2001   LUMBAR LAMINECTOMY/DECOMPRESSION MICRODISCECTOMY Left 12/23/2017   Procedure: Laminectomy for facet/synovial cyst - left - Lumbar three-Lumbar four;  Surgeon: Julio Sicks, MD;  [facility];  Service: Neurosurgery;  Laterality: Left;   r foot surgery  1995    REVERSE SHOULDER ARTHROPLASTY Left 10/23/2018   Procedure: REVERSE TOTAL SHOULDER ARTHROPLASTY;  Surgeon: Beverely Low, MD;  Location: [facility];  Service: Orthopedics;  Laterality: Left;  interscalene block   REVERSE SHOULDER ARTHROPLASTY Right 04/20/2021   Procedure: REVERSE SHOULDER ARTHROPLASTY;  Surgeon: Beverely Low, MD;  Location: [facility];  Service: Orthopedics;  Laterality: Right;  with ISB   right hand surgery  2001   RIGHT HEART CATH AND CORONARY ANGIOGRAPHY N/A 10/24/2021   Procedure: RIGHT HEART CATH AND CORONARY ANGIOGRAPHY;  Surgeon: Tonny Bollman, MD;  Location: [facility] INVASIVE CV LAB;  Service: Cardiovascular;  Laterality: N/A;   right index finger surgery  2009   SHOULDER OPEN ROTATOR CUFF REPAIR  11/21/2011   Procedure: ROTATOR CUFF REPAIR SHOULDER OPEN;  Surgeon: Javier Docker, MD;  Location: [facility];  Service: Orthopedics;  Laterality: Right;  with subacromial decompression   SHOULDER OPEN ROTATOR CUFF REPAIR Right 05/09/2016   Procedure: Right shoulder mini open revision rotator cuff repair, subacromial decompression;  Surgeon: Jene Every, MD;  Location: [facility];  Service: Orthopedics;  Laterality: Right;   TOTAL HIP ARTHROPLASTY Right 04/24/2017   Procedure: RIGHT TOTAL HIP ARTHROPLASTY ANTERIOR APPROACH;  Surgeon: Samson Frederic, MD;  Location: [facility];  Service: Orthopedics;  Laterality: Right;  Needs RNFA   TRANSCATHETER AORTIC VALVE REPLACEMENT, TRANSFEMORAL N/A 11/13/2021   Procedure: Transcatheter Aortic Valve Replacement, Transfemoral Edwards 23 MM SAPIEN 3 Ultra;  Surgeon: Tonny Bollman, MD;  Location: [facility] OR;  Service: Open Heart Surgery;  Laterality: N/A;  Transfemoral approach    Allergies  Allergies  Allergen Reactions   Other Other (See Comments) and Rash    Tomato sauce, garlic, onion - severe acid reflux    Flexeril [Cyclobenzaprine] Other (See Comments)    Per spouse "she felt like a zombie"   Chlordiazepoxide-Clidinium Other (See Comments)    Dizziness  (intolerance)   Codeine Nausea Only and Rash   Lipitor [Atorvastatin] Other (See Comments)    Unbalanced    Home Medications    Prior to Admission medications   Medication Sig Start Date End Date Taking? Authorizing Provider  amLODipine (NORVASC) 5 MG tablet TAKE 1 AND 1/2 TABLETS BY MOUTH DAILY 06/14/22   Mliss Sax, MD  amoxicillin (AMOXIL) 500 MG tablet Take 2,000 mg by mouth as directed. Take 4 capsules (2000 mg) by mouth 1 hour prior to dental appointments    [provider]  aspirin 81 MG chewable tablet Chew 1 tablet (81 mg total) by mouth daily. 11/14/21   Janetta Hora, PA-C  Calcium Carbonate-Vitamin D (CALCIUM-D PO) Take 2 tablets by mouth in the morning. CHEWABLES    [provider]  Cholecalciferol 50 MCG (2000 UT) TABS Take 2,000 Units by mouth in the morning. Patient not taking: Reported on 06/04/2022    [provider]  Cyanocobalamin (B-12 PO) Take 1 tablet by mouth in the morning. Patient not taking: Reported on 06/04/2022    [provider]  escitalopram (LEXAPRO) 20 MG tablet TAKE 1 TABLET BY MOUTH DAILY 05/24/22   Eulis Foster, FNP  esomeprazole (NEXIUM) 40 MG capsule TAKE 1 CAPSULE BY MOUTH TWICE A DAY BEFORE A MEAL 04/08/22   Mliss Sax, MD  ezetimibe (ZETIA) 10 MG tablet Take 1 tablet (10 mg total) by mouth daily. 02/22/22   Mliss Sax, MD  ferrous sulfate 325 (65 FE) MG tablet Take 325 mg by mouth daily with breakfast.    [provider]  FIBER ADULT GUMMIES PO Take 2 tablets by mouth in the morning. Fiber Well Gummy Supplement    [provider]  GINKGO BILOBA PO Take 1 capsule by mouth in the morning.    [provider]  glucose blood test strip daily. 11/09/20   [provider]  HYDROcodone-acetaminophen (NORCO) 10-325 MG tablet Take 1 tablet by mouth every 4 (four) hours as needed (pain). 01/26/22   Jadene Pierini, MD  levothyroxine (SYNTHROID) 75 MCG  tablet Take 1 tablet (75 mcg total) by mouth daily with breakfast. 11/12/21   Mliss Sax, MD  Multiple Vitamins-Minerals (WOMENS MULTI GUMMIES PO) Take 2 tablets by mouth daily.    [provider]  oxybutynin (DITROPAN XL) 15 MG 24 hr tablet TAKE 1 TABLET BY MOUTH AT BEDTIME 05/14/22   Mliss Sax, MD  rosuvastatin (CRESTOR) 10 MG tablet TAKE ONE TABLET BY MOUTH DAILY 06/20/22   Mliss Sax, MD  traZODone (DESYREL) 50 MG tablet Take 0.5 tablets (25 mg total) by mouth at bedtime. 06/03/22   Mliss Sax, MD    Physical Exam    Vital Signs:  Cheryl Cooper does not have vital signs available for review today.  Given telephonic nature of communication, physical exam is limited. AAOx3. NAD. Normal affect.  Speech and respirations are unlabored.  Accessory Clinical Findings    None  Assessment & Plan    1.  Preoperative Cardiovascular Risk Assessment:  Patient's RCRI is 0.9%  The patient affirms she has been doing well without any new cardiac symptoms. They are able to achieve 4 METS without cardiac limitations. Therefore, based on ACC/AHA guidelines, the patient would be at acceptable risk for the planned procedure without further cardiovascular testing. The patient was advised that if she develops new symptoms prior to surgery to contact our office to arrange for a follow-up visit, and she verbalized understanding.   The patient was advised that if she develops new symptoms prior to surgery to contact our office to arrange for a follow-up visit, and she verbalized understanding.  Patient can hold aspirin 81 mg 7 days prior to procedure and should restart postprocedure when surgically safe.  A copy of this note will be routed to requesting surgeon.  Time:   Today, I have spent 7 minutes with the patient with telehealth technology discussing medical history, symptoms, and management plan.     Napoleon Form, Leodis Rains, NP  07/16/2022, 7:54  AM

## 2022-07-23 ENCOUNTER — Other Ambulatory Visit: Payer: Self-pay | Admitting: Neurosurgery

## 2022-07-25 DIAGNOSIS — M4800 Spinal stenosis, site unspecified: Secondary | ICD-10-CM | POA: Diagnosis not present

## 2022-07-25 DIAGNOSIS — E785 Hyperlipidemia, unspecified: Secondary | ICD-10-CM | POA: Diagnosis not present

## 2022-07-25 DIAGNOSIS — M5440 Lumbago with sciatica, unspecified side: Secondary | ICD-10-CM | POA: Diagnosis not present

## 2022-07-25 DIAGNOSIS — I951 Orthostatic hypotension: Secondary | ICD-10-CM | POA: Diagnosis not present

## 2022-07-25 DIAGNOSIS — F325 Major depressive disorder, single episode, in full remission: Secondary | ICD-10-CM | POA: Diagnosis not present

## 2022-07-25 DIAGNOSIS — K5900 Constipation, unspecified: Secondary | ICD-10-CM | POA: Diagnosis not present

## 2022-07-25 DIAGNOSIS — K219 Gastro-esophageal reflux disease without esophagitis: Secondary | ICD-10-CM | POA: Diagnosis not present

## 2022-07-25 DIAGNOSIS — I2510 Atherosclerotic heart disease of native coronary artery without angina pectoris: Secondary | ICD-10-CM | POA: Diagnosis not present

## 2022-07-25 DIAGNOSIS — E039 Hypothyroidism, unspecified: Secondary | ICD-10-CM | POA: Diagnosis not present

## 2022-07-25 DIAGNOSIS — E1122 Type 2 diabetes mellitus with diabetic chronic kidney disease: Secondary | ICD-10-CM | POA: Diagnosis not present

## 2022-07-25 DIAGNOSIS — R011 Cardiac murmur, unspecified: Secondary | ICD-10-CM | POA: Diagnosis not present

## 2022-07-25 DIAGNOSIS — M419 Scoliosis, unspecified: Secondary | ICD-10-CM | POA: Diagnosis not present

## 2022-07-25 DIAGNOSIS — I129 Hypertensive chronic kidney disease with stage 1 through stage 4 chronic kidney disease, or unspecified chronic kidney disease: Secondary | ICD-10-CM | POA: Diagnosis not present

## 2022-07-25 DIAGNOSIS — J309 Allergic rhinitis, unspecified: Secondary | ICD-10-CM | POA: Diagnosis not present

## 2022-07-25 DIAGNOSIS — M1990 Unspecified osteoarthritis, unspecified site: Secondary | ICD-10-CM | POA: Diagnosis not present

## 2022-07-25 DIAGNOSIS — M810 Age-related osteoporosis without current pathological fracture: Secondary | ICD-10-CM | POA: Diagnosis not present

## 2022-07-25 DIAGNOSIS — I700 Atherosclerosis of aorta: Secondary | ICD-10-CM | POA: Diagnosis not present

## 2022-07-25 DIAGNOSIS — G4700 Insomnia, unspecified: Secondary | ICD-10-CM | POA: Diagnosis not present

## 2022-07-26 ENCOUNTER — Telehealth: Payer: Self-pay

## 2022-07-26 DIAGNOSIS — N952 Postmenopausal atrophic vaginitis: Secondary | ICD-10-CM

## 2022-07-26 DIAGNOSIS — N9489 Other specified conditions associated with female genital organs and menstrual cycle: Secondary | ICD-10-CM

## 2022-07-26 NOTE — Telephone Encounter (Signed)
TW pt LVM in triage line requesting refill of estrace cream for her atrophic vulvovaginitis rxd in 03/2022.   Last AEX 08/22/2020--return in 2 years per TW, pt scheduled for 09/11/2022. Last mammo 11/17/2020-neg birads 1  Rx pend.

## 2022-07-30 MED ORDER — ESTRADIOL 0.1 MG/GM VA CREA
TOPICAL_CREAM | VAGINAL | 0 refills | Status: DC
Start: 2022-07-30 — End: 2022-12-10

## 2022-07-30 NOTE — Telephone Encounter (Signed)
Pt notified and voiced understanding. Will close encounter.  

## 2022-07-30 NOTE — Telephone Encounter (Signed)
Refill sent   Thank you

## 2022-07-30 NOTE — Telephone Encounter (Signed)
Please authorize. Thanks

## 2022-08-01 NOTE — Progress Notes (Signed)
Surgical Instructions    Your procedure is scheduled on Tuesday, June 25th, 2024.   Report to [facility] Main Entrance "A" at 06:00 A.M., then check in with the Admitting office.  Call this number if you have problems the morning of surgery:  [phone]   If you have any questions prior to your surgery date call [phone]: Open Monday-Friday 8am-4pm If you experience any cold or flu symptoms such as cough, fever, chills, shortness of breath, etc. between now and your scheduled surgery, please notify us at the above number     Remember:  Do not eat or drink after midnight the night before your surgery    Take these medicines the morning of surgery with A SIP OF WATER:   amLODipine (NORVASC)  escitalopram (LEXAPRO)  esomeprazole (NEXIUM)  ezetimibe (ZETIA)  levothyroxine (SYNTHROID)  rosuvastatin (CRESTOR)   As needed:  HYDROcodone-acetaminophen (NORCO/VICODIN)   Follow your surgeon's instructions on when to stop Aspirin.  If no instructions were given by your surgeon then you will need to call the office to get those instructions.     As of today, STOP taking any Aspirin (unless otherwise instructed by your surgeon) Aleve, Naproxen, Ibuprofen, Motrin, Advil, Goody's, BC's, all herbal medications, fish oil, and all vitamins.   HOW TO MANAGE YOUR DIABETES BEFORE AND AFTER SURGERY  Why is it important to control my blood sugar before and after surgery? Improving blood sugar levels before and after surgery helps healing and can limit problems. A way of improving blood sugar control is eating a healthy diet by:  Eating less sugar and carbohydrates  Increasing activity/exercise  Talking with your doctor about reaching your blood sugar goals High blood sugars (greater than 180 mg/dL) can raise your risk of infections and slow your recovery, so you will need to focus on controlling your diabetes during the weeks before surgery. Make sure that the doctor who takes care of your  diabetes knows about your planned surgery including the date and location.  How do I manage my blood sugar before surgery? Check your blood sugar at least 4 times a day, starting 2 days before surgery, to make sure that the level is not too high or low.  Check your blood sugar the morning of your surgery when you wake up and every 2 hours until you get to the Short Stay unit.  If your blood sugar is less than 70 mg/dL, you will need to treat for low blood sugar: Do not take insulin. Treat a low blood sugar (less than 70 mg/dL) with  cup of clear juice (cranberry or apple), 4 glucose tablets, OR glucose gel. Recheck blood sugar in 15 minutes after treatment (to make sure it is greater than 70 mg/dL). If your blood sugar is not greater than 70 mg/dL on recheck, call [phone] for further instructions. Report your blood sugar to the short stay nurse when you get to Short Stay.  If you are admitted to the hospital after surgery: Your blood sugar will be checked by the staff and you will probably be given insulin after surgery (instead of oral diabetes medicines) to make sure you have good blood sugar levels. The goal for blood sugar control after surgery is 80-180 mg/dL.            The day of surgery:  Do not wear jewelry or makeup. Do not wear lotions, powders, perfumes, or deodorant. Do not shave 48 hours prior to surgery.   Do not bring valuables to  the hospital. Do not wear nail polish, gel polish, artificial nails, or any other type of covering on natural nails (fingers and toes) If you have artificial nails or gel coating that need to be removed by a nail salon, please have this removed prior to surgery. Artificial nails or gel coating may interfere with anesthesia's ability to adequately monitor your vital signs.  [facility] is not responsible for any belongings or valuables.    Do NOT Smoke (Tobacco/Vaping)  24 hours prior to your procedure  If you use a CPAP at night, you may  bring your mask for your overnight stay.   Contacts, glasses, hearing aids, dentures or partials may not be worn into surgery, please bring cases for these belongings   For patients admitted to the hospital, discharge time will be determined by your treatment team.   Patients discharged the day of surgery will not be allowed to drive home, and someone needs to stay with them for 24 hours.   SURGICAL WAITING ROOM VISITATION Patients having surgery or a procedure may have no more than 2 support people in the waiting area - these visitors may rotate.   Children under the age of 91 must have an adult with them who is not the patient. If the patient needs to stay at the hospital during part of their recovery, the visitor guidelines for inpatient rooms apply. Pre-op nurse will coordinate an appropriate time for 1 support person to accompany patient in pre-op.  This support person may not rotate.   Please refer to [email or web address] for the visitor guidelines for Inpatients (after your surgery is over and you are in a regular room).    Special instructions:    Oral Hygiene is also important to reduce your risk of infection.  Remember - BRUSH YOUR TEETH THE MORNING OF SURGERY WITH YOUR REGULAR TOOTHPASTE     Pre-operative 5 CHG Bath Instructions   You can play a key role in reducing the risk of infection after surgery. Your skin needs to be as free of germs as possible. You can reduce the number of germs on your skin by washing with CHG (chlorhexidine gluconate) soap before surgery. CHG is an antiseptic soap that kills germs and continues to kill germs even after washing.   DO NOT use if you have an allergy to chlorhexidine/CHG or antibacterial soaps. If your skin becomes reddened or irritated, stop using the CHG and notify one of our RNs at [phone].   Please shower with the CHG soap starting 4 days before surgery using the following  schedule:     Please keep in mind the following:  DO NOT shave, including legs and underarms, starting the day of your first shower.   You may shave your face at any point before/day of surgery.  Place clean sheets on your bed the day you start using CHG soap. Use a clean washcloth (not used since being washed) for each shower. DO NOT sleep with pets once you start using the CHG.   CHG Shower Instructions:  If you choose to wash your hair and private area, wash first with your normal shampoo/soap.  After you use shampoo/soap, rinse your hair and body thoroughly to remove shampoo/soap residue.  Turn the water OFF and apply about 3 tablespoons (45 ml) of CHG soap to a CLEAN washcloth.  Apply CHG soap ONLY FROM YOUR NECK DOWN TO YOUR TOES (washing for 3-5 minutes)  DO NOT use CHG soap on face, private areas, open wounds,  or sores.  Pay special attention to the area where your surgery is being performed.  If you are having back surgery, having someone wash your back for you may be helpful. Wait 2 minutes after CHG soap is applied, then you may rinse off the CHG soap.  Pat dry with a clean towel  Put on clean clothes/pajamas   If you choose to wear lotion, please use ONLY the CHG-compatible lotions on the back of this paper.     Additional instructions for the day of surgery: DO NOT APPLY any lotions, deodorants, cologne, or perfumes.   Put on clean/comfortable clothes.  Brush your teeth.  Ask your nurse before applying any prescription medications to the skin.      CHG Compatible Lotions   Aveeno Moisturizing lotion  Cetaphil Moisturizing Cream  Cetaphil Moisturizing Lotion  Clairol Herbal Essence Moisturizing Lotion, Dry Skin  Clairol Herbal Essence Moisturizing Lotion, Extra Dry Skin  Clairol Herbal Essence Moisturizing Lotion, Normal Skin  Curel Age Defying Therapeutic Moisturizing Lotion with Alpha Hydroxy  Curel Extreme Care Body Lotion  Curel Soothing Hands Moisturizing  Hand Lotion  Curel Therapeutic Moisturizing Cream, Fragrance-Free  Curel Therapeutic Moisturizing Lotion, Fragrance-Free  Curel Therapeutic Moisturizing Lotion, Original Formula  Eucerin Daily Replenishing Lotion  Eucerin Dry Skin Therapy Plus Alpha Hydroxy Crme  Eucerin Dry Skin Therapy Plus Alpha Hydroxy Lotion  Eucerin Original Crme  Eucerin Original Lotion  Eucerin Plus Crme Eucerin Plus Lotion  Eucerin TriLipid Replenishing Lotion  Keri Anti-Bacterial Hand Lotion  Keri Deep Conditioning Original Lotion Dry Skin Formula Softly Scented  Keri Deep Conditioning Original Lotion, Fragrance Free Sensitive Skin Formula  Keri Lotion Fast Absorbing Fragrance Free Sensitive Skin Formula  Keri Lotion Fast Absorbing Softly Scented Dry Skin Formula  Keri Original Lotion  Keri Skin Renewal Lotion Keri Silky Smooth Lotion  Keri Silky Smooth Sensitive Skin Lotion  Nivea Body Creamy Conditioning Oil  Nivea Body Extra Enriched Lotion  Nivea Body Original Lotion  Nivea Body Sheer Moisturizing Lotion Nivea Crme  Nivea Skin Firming Lotion  NutraDerm 30 Skin Lotion  NutraDerm Skin Lotion  NutraDerm Therapeutic Skin Cream  NutraDerm Therapeutic Skin Lotion  ProShield Protective Hand Cream  Provon moisturizing lotion   Day of Surgery:  Take a shower with CHG soap. Wear Clean/Comfortable clothing the morning of surgery Do not apply any deodorants/lotions.   Remember to brush your teeth WITH YOUR REGULAR TOOTHPASTE.    If you received a COVID test during your pre-op visit, it is requested that you wear a mask when out in public, stay away from anyone that may not be feeling well, and notify your surgeon if you develop symptoms. If you have been in contact with anyone that has tested positive in the last 10 days, please notify your surgeon.    Please read over the following fact sheets that you were given.

## 2022-08-02 ENCOUNTER — Other Ambulatory Visit: Payer: Self-pay | Admitting: Family Medicine

## 2022-08-02 ENCOUNTER — Encounter (HOSPITAL_COMMUNITY): Payer: Self-pay

## 2022-08-02 ENCOUNTER — Encounter (HOSPITAL_COMMUNITY)
Admission: RE | Admit: 2022-08-02 | Discharge: 2022-08-02 | Disposition: A | Discharge status: Home / Self Care | Payer: Medicare PPO | Source: Ambulatory Visit | Attending: Neurosurgery | Admitting: Neurosurgery

## 2022-08-02 ENCOUNTER — Other Ambulatory Visit: Payer: Self-pay

## 2022-08-02 VITALS — BP 121/62 | HR 57 | Temp 98.2°F | Resp 18 | Ht 63.0 in | Wt 141.8 lb

## 2022-08-02 DIAGNOSIS — E785 Hyperlipidemia, unspecified: Secondary | ICD-10-CM | POA: Insufficient documentation

## 2022-08-02 DIAGNOSIS — Z01812 Encounter for preprocedural laboratory examination: Secondary | ICD-10-CM | POA: Diagnosis not present

## 2022-08-02 DIAGNOSIS — I2510 Atherosclerotic heart disease of native coronary artery without angina pectoris: Secondary | ICD-10-CM | POA: Insufficient documentation

## 2022-08-02 DIAGNOSIS — I10 Essential (primary) hypertension: Secondary | ICD-10-CM | POA: Diagnosis not present

## 2022-08-02 DIAGNOSIS — E119 Type 2 diabetes mellitus without complications: Secondary | ICD-10-CM | POA: Insufficient documentation

## 2022-08-02 DIAGNOSIS — Z01818 Encounter for other preprocedural examination: Secondary | ICD-10-CM

## 2022-08-02 LAB — TYPE AND SCREEN
ABO/RH(D): A POS
Antibody Screen: NEGATIVE

## 2022-08-02 LAB — CBC
HCT: 41.6 % (ref 36.0–46.0)
Hemoglobin: 13.9 g/dL (ref 12.0–15.0)
MCH: 30 pg (ref 26.0–34.0)
MCHC: 33.4 g/dL (ref 30.0–36.0)
MCV: 89.8 fL (ref 80.0–100.0)
Platelets: 168 10*3/uL (ref 150–400)
RBC: 4.63 MIL/uL (ref 3.87–5.11)
RDW: 13.4 % (ref 11.5–15.5)
WBC: 7.5 10*3/uL (ref 4.0–10.5)
nRBC: 0 % (ref 0.0–0.2)

## 2022-08-02 LAB — BASIC METABOLIC PANEL
Anion gap: 8 (ref 5–15)
BUN: 12 mg/dL (ref 8–23)
CO2: 26 mmol/L (ref 22–32)
Calcium: 9.3 mg/dL (ref 8.9–10.3)
Chloride: 103 mmol/L (ref 98–111)
Creatinine, Ser: 1.08 mg/dL — ABNORMAL HIGH (ref 0.44–1.00)
GFR, Estimated: 52 mL/min — ABNORMAL LOW (ref >60)
Glucose, Bld: 118 mg/dL — ABNORMAL HIGH (ref 70–99)
Potassium: 3.8 mmol/L (ref 3.5–5.1)
Sodium: 137 mmol/L (ref 135–145)

## 2022-08-02 LAB — SURGICAL PCR SCREEN
MRSA, PCR: NEGATIVE
Staphylococcus aureus: NEGATIVE

## 2022-08-02 LAB — HEMOGLOBIN A1C
Hgb A1c MFr Bld: 5.9 % — ABNORMAL HIGH (ref 4.8–5.6)
Mean Plasma Glucose: 122.63 mg/dL

## 2022-08-02 NOTE — Progress Notes (Signed)
PCP - Mliss Sax, MD Cardiologist - Lenna Gilford. O'Neal  PPM/ICD - Denies  Chest x-ray - 11/09/2021 EKG - 11/21/2021 Stress Test - Denies ECHO - 12/12/2021 Cardiac Cath - 10/24/2021  Sleep Study - Denies  DM: Type II: Patient states she is diet controlled. Fasting Blood Sugar: 106-115 Checks Blood Sugar once a day.  Blood Thinner Instructions: N/A Aspirin Instructions: Per cardiologist, patient to hold aspirin 81 mg 7 days prior to surgery. Patient received instrustions form cardiologist on 07/16/2022 and verbalized understanding.  ERAS Protcol - NPO PRE-SURGERY Ensure or G2- N/A  COVID TEST- No   Anesthesia review: Yes, Cardiac history.  Patient denies shortness of breath, fever, cough and chest pain at PAT appointment   All instructions explained to the patient, with a verbal understanding of the material. Patient agrees to go over the instructions while at home for a better understanding. The opportunity to ask questions was provided.

## 2022-08-05 NOTE — Anesthesia Preprocedure Evaluation (Addendum)
Anesthesia Evaluation  Patient identified by MRN, date of birth, ID band Patient awake    Reviewed: Allergy & Precautions, NPO status , Patient's Chart, lab work & pertinent test results  History of Anesthesia Complications Negative for: history of anesthetic complications  Airway Mallampati: II  TM Distance: >3 FB Neck ROM: Full    Dental  (+) Caps, Dental Advisory Given   Pulmonary neg pulmonary ROS   Pulmonary exam normal        Cardiovascular hypertension, Pt. on medications (-) angina + CAD (10/2021 cath: moderate D1)  Normal cardiovascular exam+ Valvular Problems/Murmurs (severe) AS   S/P TAVR 11/13/21 TEE 12/12/21 Echo 12/12/21: IMPRESSIONS   1. The aortic valve has been replaced by a 23 mm Sapien Valve. Aortic  valve regurgitation is trivial (PVL at the 12 o'clock position). Aortic  valve mean gradient measures 14.0 mmHg. DVI 0.41.   2. Left ventricular ejection fraction, by estimation, is 70 to 75%. The  left ventricle has hyperdynamic function. The left ventricle has no  regional wall motion abnormalities. There is moderate asymmetric left  ventricular hypertrophy of the septal  segment. Left ventricular diastolic parameters are consistent with Grade I  diastolic dysfunction (impaired relaxation).There is an increase  intracavitary gradient that increased with Valsalva without an LVOT  gradient.   3. Right ventricular systolic function is normal. The right ventricular  size is normal. There is normal pulmonary artery systolic pressure.   4. The mitral valve is degenerative. No evidence of mitral valve  regurgitation.  - Comparison(s): Prior images reviewed side by side. Stable prosthetic parameters.     RHC/LHC (pre-TAVR) 10/24/21:    1st Diag lesion is 60% stenosed.   1.  Patent coronary arteries (right dominant) with a moderate ostial diagonal stenosis and no other significant coronary artery disease 2.   Normal right heart pressures and cardiac output 3.  Severe calcification and restriction of the aortic valve leaflets seen on plain fluoroscopy with known severe aortic stenosis.  Severe calcification also identified in the aortic root   Recommendations: Continue evaluation for TAVR.      Neuro/Psych   Anxiety Depression    CVA, No Residual Symptoms    GI/Hepatic Neg liver ROS,GERD  Medicated and Controlled,,  Endo/Other  diabetesHypothyroidism    Renal/GU negative Renal ROS     Musculoskeletal   Abdominal   Peds  Hematology negative hematology ROS (+)   Anesthesia Other Findings   Reproductive/Obstetrics                             Anesthesia Physical Anesthesia Plan  ASA: 3  Anesthesia Plan: General   Post-op Pain Management: Tylenol PO (pre-op)*   Induction: Intravenous  PONV Risk Score and Plan: 4 or greater and Ondansetron, Dexamethasone, Treatment may vary due to age or medical condition and Diphenhydramine  Airway Management Planned: Oral ETT and Video Laryngoscope Planned  Additional Equipment: Arterial line  Intra-op Plan:   Post-operative Plan: Extubation in OR  Informed Consent: I have reviewed the patients History and Physical, chart, labs and discussed the procedure including the risks, benefits and alternatives for the proposed anesthesia with the patient or authorized representative who has indicated his/her understanding and acceptance.     Dental advisory given  Plan Discussed with: Anesthesiologist and CRNA  Anesthesia Plan Comments: (See :  Follows with cardiology for history of severe aortic stenosis s/p TAVR 10/2021, nonobstructive CAD, HTN and HLD.  Seen  by Robin Searing, NP 07/08/2022 for preop evaluation.  Per note, "Patient's RCRI is 0.9%. The patient affirms she has been doing well without any new cardiac symptoms. They are able to achieve 4 METS without cardiac limitations. Therefore, based on ACC/AHA  guidelines, the patient would be at acceptable risk for the planned procedure without further cardiovascular testing. The patient was advised that if she develops new symptoms prior to surgery to contact our office to arrange for a follow-up visit, and she verbalized understanding. The patient was advised that if she develops new symptoms prior to surgery to contact our office to arrange for a follow-up visit, and she verbalized understanding. Patient can hold aspirin 81 mg 7 days prior to procedure and should restart postprocedure when surgically safe."  Diet controlled DM2 A1c 5.9 on preop labs.  Preop labs reviewed, unremarkable.  EKG 11/21/21: Sinus bradycardia at 56 bpm. First degree AV block. LAD. Inferior infarct, age undetermined. Cannot rule out anterior infarct (age undetermined).   Echo 12/12/21: IMPRESSIONS  1. The aortic valve has been replaced by a 23 mm Sapien Valve. Aortic  valve regurgitation is trivial (PVL at the 12 o'clock position). Aortic  valve mean gradient measures 14.0 mmHg. DVI 0.41.  2. Left ventricular ejection fraction, by estimation, is 70 to 75%. The  left ventricle has hyperdynamic function. The left ventricle has no  regional wall motion abnormalities. There is moderate asymmetric left  ventricular hypertrophy of the septal  segment. Left ventricular diastolic parameters are consistent with Grade I  diastolic dysfunction (impaired relaxation).There is an increase  intracavitary gradient that increased with Valsalva without an LVOT  gradient.  3. Right ventricular systolic function is normal. The right ventricular  size is normal. There is normal pulmonary artery systolic pressure.  4. The mitral valve is degenerative. No evidence of mitral valve  regurgitation.  - Comparison(s): Prior images reviewed side by side. Stable prosthetic parameters.   RHC/LHC (pre-TAVR) 10/24/21:  1st Diag lesion is 60% stenosed.  1. Patent coronary arteries (right  dominant) with a moderate ostial diagonal stenosis and no other significant coronary artery disease 2. Normal right heart pressures and cardiac output 3. Severe calcification and restriction of the aortic valve leaflets seen on plain fluoroscopy with known severe aortic stenosis. Severe calcification also identified in the aortic root  Recommendations: Continue evaluation for TAVR.  )        Anesthesia Quick Evaluation

## 2022-08-05 NOTE — Progress Notes (Signed)
Anesthesia Chart Review:  Follows with cardiology for history of severe aortic stenosis s/p TAVR 10/2021, nonobstructive CAD, HTN and HLD.  Seen by Robin Searing, NP 07/08/2022 for preop evaluation.  Per note, "Patient's RCRI is 0.9%. The patient affirms she has been doing well without any new cardiac symptoms. They are able to achieve 4 METS without cardiac limitations. Therefore, based on ACC/AHA guidelines, the patient would be at acceptable risk for the planned procedure without further cardiovascular testing. The patient was advised that if she develops new symptoms prior to surgery to contact our office to arrange for a follow-up visit, and she verbalized understanding. The patient was advised that if she develops new symptoms prior to surgery to contact our office to arrange for a follow-up visit, and she verbalized understanding. Patient can hold aspirin 81 mg 7 days prior to procedure and should restart postprocedure when surgically safe."  Diet controlled DM2 A1c 5.9 on preop labs.  Preop labs reviewed, unremarkable.  EKG 11/21/21: Sinus bradycardia at 56 bpm. First degree AV block. LAD. Inferior infarct, age undetermined. Cannot rule out anterior infarct (age undetermined).   Echo 12/12/21: IMPRESSIONS   1. The aortic valve has been replaced by a 23 mm Sapien Valve. Aortic  valve regurgitation is trivial (PVL at the 12 o'clock position). Aortic  valve mean gradient measures 14.0 mmHg. DVI 0.41.   2. Left ventricular ejection fraction, by estimation, is 70 to 75%. The  left ventricle has hyperdynamic function. The left ventricle has no  regional wall motion abnormalities. There is moderate asymmetric left  ventricular hypertrophy of the septal  segment. Left ventricular diastolic parameters are consistent with Grade I  diastolic dysfunction (impaired relaxation).There is an increase  intracavitary gradient that increased with Valsalva without an LVOT  gradient.   3. Right ventricular  systolic function is normal. The right ventricular  size is normal. There is normal pulmonary artery systolic pressure.   4. The mitral valve is degenerative. No evidence of mitral valve  regurgitation.  - Comparison(s): Prior images reviewed side by side. Stable prosthetic parameters.    RHC/LHC (pre-TAVR) 10/24/21:   1st Diag lesion is 60% stenosed.   1.  Patent coronary arteries (right dominant) with a moderate ostial diagonal stenosis and no other significant coronary artery disease 2.  Normal right heart pressures and cardiac output 3.  Severe calcification and restriction of the aortic valve leaflets seen on plain fluoroscopy with known severe aortic stenosis.  Severe calcification also identified in the aortic root   Recommendations: Continue evaluation for TAVR.        Cheryl Cooper [facility] Short Stay Center/Anesthesiology Phone [phone] 08/05/2022 11:25 AM

## 2022-08-12 HISTORY — PX: LUMBAR SPINE SURGERY: SHX701

## 2022-08-13 ENCOUNTER — Ambulatory Visit (HOSPITAL_COMMUNITY): Payer: Medicare PPO

## 2022-08-13 ENCOUNTER — Other Ambulatory Visit: Payer: Self-pay

## 2022-08-13 ENCOUNTER — Ambulatory Visit (HOSPITAL_COMMUNITY): Payer: Medicare PPO | Admitting: Physician Assistant

## 2022-08-13 ENCOUNTER — Encounter (HOSPITAL_COMMUNITY): Payer: Self-pay | Admitting: Neurosurgery

## 2022-08-13 ENCOUNTER — Observation Stay (HOSPITAL_COMMUNITY)
Admission: RE | Admit: 2022-08-13 | Discharge: 2022-08-14 | Disposition: A | Discharge status: Home / Self Care | Payer: Medicare PPO | Attending: Neurosurgery | Admitting: Neurosurgery

## 2022-08-13 ENCOUNTER — Encounter (HOSPITAL_COMMUNITY)
Admission: RE | Disposition: A | Discharge status: Home / Self Care | Payer: Self-pay | Source: Home / Self Care | Attending: Neurosurgery

## 2022-08-13 ENCOUNTER — Ambulatory Visit (HOSPITAL_BASED_OUTPATIENT_CLINIC_OR_DEPARTMENT_OTHER): Payer: Medicare PPO

## 2022-08-13 ENCOUNTER — Ambulatory Visit: Payer: Medicare PPO | Admitting: Diagnostic Neuroimaging

## 2022-08-13 DIAGNOSIS — I10 Essential (primary) hypertension: Secondary | ICD-10-CM | POA: Insufficient documentation

## 2022-08-13 DIAGNOSIS — M48061 Spinal stenosis, lumbar region without neurogenic claudication: Secondary | ICD-10-CM

## 2022-08-13 DIAGNOSIS — M4316 Spondylolisthesis, lumbar region: Secondary | ICD-10-CM

## 2022-08-13 DIAGNOSIS — Z96641 Presence of right artificial hip joint: Secondary | ICD-10-CM | POA: Diagnosis not present

## 2022-08-13 DIAGNOSIS — E119 Type 2 diabetes mellitus without complications: Secondary | ICD-10-CM | POA: Insufficient documentation

## 2022-08-13 DIAGNOSIS — E039 Hypothyroidism, unspecified: Secondary | ICD-10-CM | POA: Diagnosis not present

## 2022-08-13 DIAGNOSIS — Z96612 Presence of left artificial shoulder joint: Secondary | ICD-10-CM | POA: Diagnosis not present

## 2022-08-13 DIAGNOSIS — Z8673 Personal history of transient ischemic attack (TIA), and cerebral infarction without residual deficits: Secondary | ICD-10-CM | POA: Insufficient documentation

## 2022-08-13 DIAGNOSIS — M5116 Intervertebral disc disorders with radiculopathy, lumbar region: Secondary | ICD-10-CM | POA: Diagnosis not present

## 2022-08-13 DIAGNOSIS — Z79899 Other long term (current) drug therapy: Secondary | ICD-10-CM | POA: Insufficient documentation

## 2022-08-13 DIAGNOSIS — I2510 Atherosclerotic heart disease of native coronary artery without angina pectoris: Secondary | ICD-10-CM

## 2022-08-13 DIAGNOSIS — Z96611 Presence of right artificial shoulder joint: Secondary | ICD-10-CM | POA: Diagnosis not present

## 2022-08-13 DIAGNOSIS — Z7982 Long term (current) use of aspirin: Secondary | ICD-10-CM | POA: Diagnosis not present

## 2022-08-13 DIAGNOSIS — M5416 Radiculopathy, lumbar region: Secondary | ICD-10-CM | POA: Insufficient documentation

## 2022-08-13 DIAGNOSIS — M5126 Other intervertebral disc displacement, lumbar region: Secondary | ICD-10-CM | POA: Diagnosis not present

## 2022-08-13 LAB — GLUCOSE, CAPILLARY
Glucose-Capillary: 116 mg/dL — ABNORMAL HIGH (ref 70–99)
Glucose-Capillary: 139 mg/dL — ABNORMAL HIGH (ref 70–99)
Glucose-Capillary: 186 mg/dL — ABNORMAL HIGH (ref 70–99)

## 2022-08-13 SURGERY — POSTERIOR LUMBAR FUSION 1 LEVEL
Anesthesia: General | Site: Back

## 2022-08-13 MED ORDER — THROMBIN 20000 UNITS EX SOLR
CUTANEOUS | Status: AC
  Filled 2022-08-13: qty 20000

## 2022-08-13 MED ORDER — PHENYLEPHRINE HCL-NACL 20-0.9 MG/250ML-% IV SOLN
INTRAVENOUS | Status: DC | PRN
  Administered 2022-08-13: 20 ug/min via INTRAVENOUS

## 2022-08-13 MED ORDER — TRAZODONE HCL 50 MG PO TABS
25.0000 mg | ORAL_TABLET | Freq: Every day | ORAL | Status: DC
  Administered 2022-08-13: 25 mg via ORAL
  Filled 2022-08-13: qty 1

## 2022-08-13 MED ORDER — LEVOTHYROXINE SODIUM 75 MCG PO TABS
75.0000 ug | ORAL_TABLET | Freq: Every day | ORAL | Status: DC
  Administered 2022-08-14: 75 ug via ORAL
  Filled 2022-08-13: qty 1

## 2022-08-13 MED ORDER — OXYCODONE HCL 5 MG PO TABS
5.0000 mg | ORAL_TABLET | Freq: Once | ORAL | Status: AC | PRN
  Administered 2022-08-13: 5 mg via ORAL

## 2022-08-13 MED ORDER — 0.9 % SODIUM CHLORIDE (POUR BTL) OPTIME
TOPICAL | Status: DC | PRN
  Administered 2022-08-13: 1000 mL

## 2022-08-13 MED ORDER — SODIUM CHLORIDE 0.9% FLUSH
3.0000 mL | Freq: Two times a day (BID) | INTRAVENOUS | Status: DC
  Administered 2022-08-13 – 2022-08-14 (×2): 3 mL via INTRAVENOUS

## 2022-08-13 MED ORDER — CHLORHEXIDINE GLUCONATE CLOTH 2 % EX PADS: 6.0000 | MEDICATED_PAD | Freq: Once | CUTANEOUS | Status: DC

## 2022-08-13 MED ORDER — PROPOFOL 10 MG/ML IV BOLUS
INTRAVENOUS | Status: DC | PRN
  Administered 2022-08-13: 100 mg via INTRAVENOUS
  Administered 2022-08-13: 20 mg via INTRAVENOUS

## 2022-08-13 MED ORDER — ACETAMINOPHEN 650 MG RE SUPP: 650.0000 mg | RECTAL | Status: DC | PRN

## 2022-08-13 MED ORDER — FENTANYL CITRATE (PF) 100 MCG/2ML IJ SOLN
INTRAMUSCULAR | Status: AC
  Filled 2022-08-13: qty 2

## 2022-08-13 MED ORDER — EPHEDRINE SULFATE-NACL 50-0.9 MG/10ML-% IV SOSY
PREFILLED_SYRINGE | INTRAVENOUS | Status: DC | PRN
  Administered 2022-08-13: 5 mg via INTRAVENOUS

## 2022-08-13 MED ORDER — ROSUVASTATIN CALCIUM 5 MG PO TABS
10.0000 mg | ORAL_TABLET | Freq: Every day | ORAL | Status: DC
  Administered 2022-08-13 – 2022-08-14 (×2): 10 mg via ORAL
  Filled 2022-08-13 (×2): qty 2

## 2022-08-13 MED ORDER — VANCOMYCIN HCL 1000 MG IV SOLR
INTRAVENOUS | Status: DC | PRN
  Administered 2022-08-13: 1000 mg

## 2022-08-13 MED ORDER — FENTANYL CITRATE (PF) 250 MCG/5ML IJ SOLN
INTRAMUSCULAR | Status: DC | PRN
  Administered 2022-08-13 (×2): 25 ug via INTRAVENOUS
  Administered 2022-08-13: 50 ug via INTRAVENOUS
  Administered 2022-08-13: 100 ug via INTRAVENOUS

## 2022-08-13 MED ORDER — CEFAZOLIN SODIUM-DEXTROSE 2-4 GM/100ML-% IV SOLN
2.0000 g | INTRAVENOUS | Status: AC
  Administered 2022-08-13: 2 g via INTRAVENOUS

## 2022-08-13 MED ORDER — HYDROMORPHONE HCL 1 MG/ML IJ SOLN
1.0000 mg | INTRAMUSCULAR | Status: DC | PRN
  Administered 2022-08-13 (×2): 1 mg via INTRAVENOUS
  Filled 2022-08-13 (×2): qty 1

## 2022-08-13 MED ORDER — ACETAMINOPHEN 10 MG/ML IV SOLN: 1000.0000 mg | Freq: Once | INTRAVENOUS | Status: DC | PRN

## 2022-08-13 MED ORDER — FENTANYL CITRATE (PF) 100 MCG/2ML IJ SOLN
25.0000 ug | INTRAMUSCULAR | Status: DC | PRN
  Administered 2022-08-13 (×3): 25 ug via INTRAVENOUS

## 2022-08-13 MED ORDER — THROMBIN 20000 UNITS EX SOLR
CUTANEOUS | Status: DC | PRN
  Administered 2022-08-13: 20 mL via TOPICAL

## 2022-08-13 MED ORDER — POLYETHYLENE GLYCOL 3350 17 G PO PACK: 17.0000 g | PACK | Freq: Every day | ORAL | Status: DC | PRN

## 2022-08-13 MED ORDER — PHENYLEPHRINE 80 MCG/ML (10ML) SYRINGE FOR IV PUSH (FOR BLOOD PRESSURE SUPPORT)
PREFILLED_SYRINGE | INTRAVENOUS | Status: DC | PRN
  Administered 2022-08-13: 80 ug via INTRAVENOUS

## 2022-08-13 MED ORDER — ADULT MULTIVITAMIN W/MINERALS CH
1.0000 | ORAL_TABLET | Freq: Every day | ORAL | Status: DC
  Administered 2022-08-13 – 2022-08-14 (×2): 1 via ORAL
  Filled 2022-08-13 (×2): qty 1

## 2022-08-13 MED ORDER — CHLORHEXIDINE GLUCONATE 0.12 % MT SOLN
OROMUCOSAL | Status: AC
  Administered 2022-08-13: 15 mL via OROMUCOSAL
  Filled 2022-08-13: qty 15

## 2022-08-13 MED ORDER — SODIUM CHLORIDE 0.9 % IV SOLN: 250.0000 mL | INTRAVENOUS | Status: DC

## 2022-08-13 MED ORDER — ROCURONIUM BROMIDE 10 MG/ML (PF) SYRINGE
PREFILLED_SYRINGE | INTRAVENOUS | Status: AC
  Filled 2022-08-13: qty 10

## 2022-08-13 MED ORDER — FENTANYL CITRATE (PF) 250 MCG/5ML IJ SOLN
INTRAMUSCULAR | Status: AC
  Filled 2022-08-13: qty 5

## 2022-08-13 MED ORDER — OXYCODONE HCL 5 MG PO TABS
ORAL_TABLET | ORAL | Status: AC
  Filled 2022-08-13: qty 1

## 2022-08-13 MED ORDER — PHENYLEPHRINE 80 MCG/ML (10ML) SYRINGE FOR IV PUSH (FOR BLOOD PRESSURE SUPPORT)
PREFILLED_SYRINGE | INTRAVENOUS | Status: AC
  Filled 2022-08-13: qty 10

## 2022-08-13 MED ORDER — HYDROCODONE-ACETAMINOPHEN 10-325 MG PO TABS
1.0000 | ORAL_TABLET | ORAL | Status: DC | PRN
  Administered 2022-08-14: 1 via ORAL
  Filled 2022-08-13: qty 1

## 2022-08-13 MED ORDER — BISACODYL 10 MG RE SUPP: 10.0000 mg | Freq: Every day | RECTAL | Status: DC | PRN

## 2022-08-13 MED ORDER — DEXAMETHASONE SODIUM PHOSPHATE 10 MG/ML IJ SOLN
INTRAMUSCULAR | Status: DC | PRN
  Administered 2022-08-13: 5 mg via INTRAVENOUS

## 2022-08-13 MED ORDER — DEXAMETHASONE SODIUM PHOSPHATE 10 MG/ML IJ SOLN
INTRAMUSCULAR | Status: AC
  Filled 2022-08-13: qty 1

## 2022-08-13 MED ORDER — ACETAMINOPHEN 500 MG PO TABS
ORAL_TABLET | ORAL | Status: AC
  Filled 2022-08-13: qty 2

## 2022-08-13 MED ORDER — OXYBUTYNIN CHLORIDE ER 5 MG PO TB24
15.0000 mg | ORAL_TABLET | Freq: Every day | ORAL | Status: DC
  Administered 2022-08-13: 15 mg via ORAL
  Filled 2022-08-13 (×3): qty 1

## 2022-08-13 MED ORDER — BUPIVACAINE HCL (PF) 0.25 % IJ SOLN
INTRAMUSCULAR | Status: AC
  Filled 2022-08-13: qty 30

## 2022-08-13 MED ORDER — SODIUM CHLORIDE 0.9 % IV SOLN: INTRAVENOUS | Status: DC | PRN

## 2022-08-13 MED ORDER — LIDOCAINE 2% (20 MG/ML) 5 ML SYRINGE
INTRAMUSCULAR | Status: AC
  Filled 2022-08-13: qty 5

## 2022-08-13 MED ORDER — WOMENS MULTI GUMMIES PO CHEW: CHEWABLE_TABLET | Freq: Every day | ORAL | Status: DC

## 2022-08-13 MED ORDER — EZETIMIBE 10 MG PO TABS
10.0000 mg | ORAL_TABLET | Freq: Every day | ORAL | Status: DC
  Administered 2022-08-14: 10 mg via ORAL
  Filled 2022-08-13: qty 1

## 2022-08-13 MED ORDER — DIAZEPAM 5 MG PO TABS: 5.0000 mg | ORAL_TABLET | Freq: Four times a day (QID) | ORAL | Status: DC | PRN

## 2022-08-13 MED ORDER — PROPOFOL 10 MG/ML IV BOLUS
INTRAVENOUS | Status: AC
  Filled 2022-08-13: qty 20

## 2022-08-13 MED ORDER — ASPIRIN 81 MG PO CHEW
81.0000 mg | CHEWABLE_TABLET | Freq: Every day | ORAL | Status: DC
  Administered 2022-08-13 – 2022-08-14 (×2): 81 mg via ORAL
  Filled 2022-08-13 (×2): qty 1

## 2022-08-13 MED ORDER — ALBUTEROL SULFATE HFA 108 (90 BASE) MCG/ACT IN AERS
INHALATION_SPRAY | RESPIRATORY_TRACT | Status: DC | PRN
  Administered 2022-08-13: 6 via RESPIRATORY_TRACT

## 2022-08-13 MED ORDER — OXYCODONE HCL 5 MG PO TABS
10.0000 mg | ORAL_TABLET | ORAL | Status: DC | PRN
  Administered 2022-08-13 – 2022-08-14 (×5): 10 mg via ORAL
  Filled 2022-08-13 (×5): qty 2

## 2022-08-13 MED ORDER — ALBUTEROL SULFATE HFA 108 (90 BASE) MCG/ACT IN AERS
INHALATION_SPRAY | RESPIRATORY_TRACT | Status: AC
  Filled 2022-08-13: qty 6.7

## 2022-08-13 MED ORDER — LIDOCAINE 2% (20 MG/ML) 5 ML SYRINGE
INTRAMUSCULAR | Status: DC | PRN
  Administered 2022-08-13: 60 mg via INTRAVENOUS

## 2022-08-13 MED ORDER — OXYCODONE HCL 5 MG/5ML PO SOLN: 5.0000 mg | Freq: Once | ORAL | Status: AC | PRN

## 2022-08-13 MED ORDER — CEFAZOLIN SODIUM-DEXTROSE 2-4 GM/100ML-% IV SOLN
INTRAVENOUS | Status: AC
  Filled 2022-08-13: qty 100

## 2022-08-13 MED ORDER — ACETAMINOPHEN 500 MG PO TABS: 1000.0000 mg | ORAL_TABLET | Freq: Once | ORAL | Status: DC

## 2022-08-13 MED ORDER — MENTHOL 3 MG MT LOZG: 1.0000 | LOZENGE | OROMUCOSAL | Status: DC | PRN

## 2022-08-13 MED ORDER — FLEET ENEMA 7-19 GM/118ML RE ENEM: 1.0000 | ENEMA | Freq: Once | RECTAL | Status: DC | PRN

## 2022-08-13 MED ORDER — ACETAMINOPHEN 325 MG PO TABS: 650.0000 mg | ORAL_TABLET | ORAL | Status: DC | PRN

## 2022-08-13 MED ORDER — ALBUMIN HUMAN 5 % IV SOLN: INTRAVENOUS | Status: DC | PRN

## 2022-08-13 MED ORDER — LACTATED RINGERS IV SOLN: INTRAVENOUS | Status: DC | PRN

## 2022-08-13 MED ORDER — AMISULPRIDE (ANTIEMETIC) 5 MG/2ML IV SOLN: 10.0000 mg | Freq: Once | INTRAVENOUS | Status: DC | PRN

## 2022-08-13 MED ORDER — SODIUM CHLORIDE 0.9% FLUSH: 3.0000 mL | INTRAVENOUS | Status: DC | PRN

## 2022-08-13 MED ORDER — ONDANSETRON HCL 4 MG/2ML IJ SOLN: 4.0000 mg | Freq: Four times a day (QID) | INTRAMUSCULAR | Status: DC | PRN

## 2022-08-13 MED ORDER — CALCIUM 600+D PLUS MINERALS 600-400 MG-UNIT PO TABS: ORAL_TABLET | Freq: Every day | ORAL | Status: DC

## 2022-08-13 MED ORDER — OYSTER SHELL CALCIUM/D3 500-5 MG-MCG PO TABS
1.0000 | ORAL_TABLET | Freq: Every day | ORAL | Status: DC
  Administered 2022-08-14: 1 via ORAL
  Filled 2022-08-13: qty 1

## 2022-08-13 MED ORDER — CHLORHEXIDINE GLUCONATE 0.12 % MT SOLN: 15.0000 mL | Freq: Once | OROMUCOSAL | Status: AC

## 2022-08-13 MED ORDER — ONDANSETRON HCL 4 MG/2ML IJ SOLN
INTRAMUSCULAR | Status: AC
  Filled 2022-08-13: qty 2

## 2022-08-13 MED ORDER — AMLODIPINE BESYLATE 5 MG PO TABS
7.5000 mg | ORAL_TABLET | Freq: Every day | ORAL | Status: DC
  Administered 2022-08-14: 7.5 mg via ORAL
  Filled 2022-08-13: qty 1

## 2022-08-13 MED ORDER — PANTOPRAZOLE SODIUM 40 MG PO TBEC
40.0000 mg | DELAYED_RELEASE_TABLET | Freq: Every day | ORAL | Status: DC
  Administered 2022-08-14: 40 mg via ORAL
  Filled 2022-08-13: qty 1

## 2022-08-13 MED ORDER — CEFAZOLIN SODIUM-DEXTROSE 1-4 GM/50ML-% IV SOLN
1.0000 g | Freq: Three times a day (TID) | INTRAVENOUS | Status: AC
  Administered 2022-08-13 – 2022-08-14 (×2): 1 g via INTRAVENOUS
  Filled 2022-08-13 (×2): qty 50

## 2022-08-13 MED ORDER — ROCURONIUM BROMIDE 10 MG/ML (PF) SYRINGE
PREFILLED_SYRINGE | INTRAVENOUS | Status: DC | PRN
  Administered 2022-08-13: 80 mg via INTRAVENOUS
  Administered 2022-08-13: 20 mg via INTRAVENOUS
  Administered 2022-08-13: 10 mg via INTRAVENOUS

## 2022-08-13 MED ORDER — ORAL CARE MOUTH RINSE: 15.0000 mL | Freq: Once | OROMUCOSAL | Status: AC

## 2022-08-13 MED ORDER — VANCOMYCIN HCL 1000 MG IV SOLR
INTRAVENOUS | Status: AC
  Filled 2022-08-13: qty 20

## 2022-08-13 MED ORDER — ACETAMINOPHEN 500 MG PO TABS
500.0000 mg | ORAL_TABLET | Freq: Once | ORAL | Status: AC
  Administered 2022-08-13: 500 mg via ORAL

## 2022-08-13 MED ORDER — PHENOL 1.4 % MT LIQD: 1.0000 | OROMUCOSAL | Status: DC | PRN

## 2022-08-13 MED ORDER — EPHEDRINE 5 MG/ML INJ
INTRAVENOUS | Status: AC
  Filled 2022-08-13: qty 5

## 2022-08-13 MED ORDER — ONDANSETRON HCL 4 MG PO TABS: 4.0000 mg | ORAL_TABLET | Freq: Four times a day (QID) | ORAL | Status: DC | PRN

## 2022-08-13 MED ORDER — LACTATED RINGERS IV SOLN: INTRAVENOUS | Status: DC

## 2022-08-13 MED ORDER — ONDANSETRON HCL 4 MG/2ML IJ SOLN
INTRAMUSCULAR | Status: DC | PRN
  Administered 2022-08-13: 4 mg via INTRAVENOUS

## 2022-08-13 MED ORDER — INSULIN ASPART 100 UNIT/ML IJ SOLN: 0.0000 [IU] | INTRAMUSCULAR | Status: DC | PRN

## 2022-08-13 MED ORDER — ESCITALOPRAM OXALATE 20 MG PO TABS
20.0000 mg | ORAL_TABLET | Freq: Every day | ORAL | Status: DC
  Administered 2022-08-14: 20 mg via ORAL
  Filled 2022-08-13: qty 1

## 2022-08-13 MED ORDER — BUPIVACAINE HCL (PF) 0.25 % IJ SOLN
INTRAMUSCULAR | Status: DC | PRN
  Administered 2022-08-13: 20 mL

## 2022-08-13 MED ORDER — SUGAMMADEX SODIUM 200 MG/2ML IV SOLN
INTRAVENOUS | Status: DC | PRN
  Administered 2022-08-13: 150 mg via INTRAVENOUS

## 2022-08-13 SURGICAL SUPPLY — 61 items
ADH SKN CLS APL DERMABOND .7 (GAUZE/BANDAGES/DRESSINGS) ×2
APL SKNCLS NONHYPOALLERGENIC (GAUZE/BANDAGES/DRESSINGS) ×1
APL SKNCLS STERI-STRIP NONHPOA (GAUZE/BANDAGES/DRESSINGS) ×2
BAG COUNTER SPONGE SURGICOUNT (BAG) ×2 IMPLANT
BAG DECANTER FOR FLEXI CONT (MISCELLANEOUS) ×2 IMPLANT
BAG SPNG CNTER NS LX DISP (BAG) ×4
BENZOIN TINCTURE PRP APPL 2/3 (GAUZE/BANDAGES/DRESSINGS) ×2 IMPLANT
BLADE BONE MILL MEDIUM (MISCELLANEOUS) ×2 IMPLANT
BLADE CLIPPER SURG (BLADE) IMPLANT
BUR CUTTER 7.0 ROUND (BURR) ×2 IMPLANT
BUR MATCHSTICK NEURO 3.0 LAGG (BURR) ×2 IMPLANT
CAGE EXP CATALYFT 9 (Plate) IMPLANT
CANISTER SUCT 3000ML PPV (MISCELLANEOUS) ×2 IMPLANT
CNTNR URN SCR LID CUP LEK RST (MISCELLANEOUS) ×2 IMPLANT
CONT SPEC 4OZ STRL OR WHT (MISCELLANEOUS) ×2
COVER BACK TABLE 60X90IN (DRAPES) ×2 IMPLANT
DERMABOND ADVANCED .7 DNX12 (GAUZE/BANDAGES/DRESSINGS) ×2 IMPLANT
DRAPE C-ARM 42X72 X-RAY (DRAPES) ×4 IMPLANT
DRAPE HALF SHEET 40X57 (DRAPES) IMPLANT
DRAPE LAPAROTOMY 100X72X124 (DRAPES) ×2 IMPLANT
DRAPE SURG 17X23 STRL (DRAPES) ×8 IMPLANT
DRSG OPSITE POSTOP 4X6 (GAUZE/BANDAGES/DRESSINGS) ×2 IMPLANT
DURAPREP 26ML APPLICATOR (WOUND CARE) ×2 IMPLANT
ELECT REM PT RETURN 9FT ADLT (ELECTROSURGICAL) ×2
ELECTRODE REM PT RTRN 9FT ADLT (ELECTROSURGICAL) ×2 IMPLANT
EVACUATOR 1/8 PVC DRAIN (DRAIN) IMPLANT
GAUZE 4X4 16PLY ~~LOC~~+RFID DBL (SPONGE) IMPLANT
GAUZE SPONGE 4X4 12PLY STRL (GAUZE/BANDAGES/DRESSINGS) IMPLANT
GLOVE BIO SURGEON STRL SZ 6.5 (GLOVE) ×2 IMPLANT
GLOVE BIOGEL PI IND STRL 6.5 (GLOVE) ×2 IMPLANT
GLOVE ECLIPSE 9.0 STRL (GLOVE) ×4 IMPLANT
GLOVE EXAM NITRILE XL STR (GLOVE) IMPLANT
GOWN STRL REUS W/ TWL LRG LVL3 (GOWN DISPOSABLE) IMPLANT
GOWN STRL REUS W/ TWL XL LVL3 (GOWN DISPOSABLE) ×4 IMPLANT
GOWN STRL REUS W/TWL 2XL LVL3 (GOWN DISPOSABLE) IMPLANT
GOWN STRL REUS W/TWL LRG LVL3 (GOWN DISPOSABLE) ×4
GOWN STRL REUS W/TWL XL LVL3 (GOWN DISPOSABLE) ×4
KIT BASIN OR (CUSTOM PROCEDURE TRAY) ×2 IMPLANT
KIT TURNOVER KIT B (KITS) ×2 IMPLANT
NDL HYPO 22X1.5 SAFETY MO (MISCELLANEOUS) ×2 IMPLANT
NEEDLE HYPO 22X1.5 SAFETY MO (MISCELLANEOUS) ×2 IMPLANT
NS IRRIG 1000ML POUR BTL (IV SOLUTION) ×2 IMPLANT
PACK LAMINECTOMY NEURO (CUSTOM PROCEDURE TRAY) ×2 IMPLANT
PUTTY GRAFTON DBF 6CC W/DELIVE (Putty) IMPLANT
ROD RELIN-O LORD 5.5X65MM (Rod) IMPLANT
ROD RELINE-O LORD 5.5X35MM (Rod) IMPLANT
SCREW LOCK RELINE 5.5 TULIP (Screw) IMPLANT
SCREW RELINE-O POLY 6.5X45 (Screw) IMPLANT
SOL ELECTROSURG ANTI STICK (MISCELLANEOUS) ×2
SOLUTION ELECTROSURG ANTI STCK (MISCELLANEOUS) ×2 IMPLANT
SPIKE FLUID TRANSFER (MISCELLANEOUS) ×2 IMPLANT
SPONGE SURGIFOAM ABS GEL 100 (HEMOSTASIS) ×2 IMPLANT
STRIP CLOSURE SKIN 1/2X4 (GAUZE/BANDAGES/DRESSINGS) ×4 IMPLANT
SUT VIC AB 0 CT1 18XCR BRD8 (SUTURE) ×4 IMPLANT
SUT VIC AB 0 CT1 8-18 (SUTURE) ×2
SUT VIC AB 2-0 CT1 18 (SUTURE) ×2 IMPLANT
SUT VIC AB 3-0 SH 8-18 (SUTURE) ×4 IMPLANT
TOWEL GREEN STERILE (TOWEL DISPOSABLE) ×2 IMPLANT
TOWEL GREEN STERILE FF (TOWEL DISPOSABLE) ×2 IMPLANT
TRAY FOLEY MTR SLVR 16FR STAT (SET/KITS/TRAYS/PACK) ×2 IMPLANT
WATER STERILE IRR 1000ML POUR (IV SOLUTION) ×2 IMPLANT

## 2022-08-13 NOTE — Op Note (Signed)
Date of procedure: 08/13/2022  Date of dictation: Same  Service: Neurosurgery  Preoperative diagnosis: Status post L3-4 fusion with L4-5 adjacent level degeneration with degenerative spondylolisthesis and severe foraminal stenosis with radiculopathy  Postoperative diagnosis: Same  Procedure Name: Bilateral L4-5 decompressive laminotomies and foraminotomies, more than would be required for simple interbody fusion alone.  L4-5 posterior lumbar interbody fusion utilizing interbody cages, locally harvested autograft, and morselized allograft  Removal of L3-4 instrumentation  L4-5 posterior lateral fusion utilizing local autograft and nonsegmental pedicle screw fixation  Surgeon:Latanga Nedrow A.Kenard Morawski, M.D.  Asst. Surgeon: Doran Durand, NP  Anesthesia: General  Indication: 82 year old female remotely status post L3-4 lateral decompression and fusion presents with worsening back and right lower extremity radicular pain failing all efforts of conservative management.  Workup demonstrates evidence of adjacent level degeneration with early general spinal listhesis and severe foraminal stenosis right greater than left at L4-5.  Patient has failed conservative management presents now for decompression and fusion L4-5.  This will require revising her prior instrumentation L3-4 on the left.  Operative note: After induction anesthesia, patient positioned prone onto Wilson frame and properly padded.  Lumbar region prepped and draped sterilely.  Incision made from L3-L5.  Dissection performed bilaterally.  Retractor placed.  Fluoroscopy used.  Levels confirmed.  Previously placed pedicle screws potation at L3 and L4 on the left side was dissected free.  The hardware was disassembled the caps and rods were removed.  The screws were left in place.  The fusion was inspected and found to be solid.  Attention then placed to the L4-5 level.  Decompressive laminotomies and facetectomies were then performed bilaterally at L4 4 5  utilizing Leksell rongeurs, care centers and high-speed drill to remove the inferior two thirds of the lamina of L4 the entire inferior facet and pars interarticularis of L4 were removed bilaterally.  The majority of the superior articular process of L5 were removed bilaterally.  The superior aspect the L5 lamina was removed bilaterally.  Ligamentum flavum was elevated and resected.  Foraminotomies completed on the course exiting L4 and L5 nerve roots with particular attention to the exiting right L4 nerve root.  Bilateral discectomy was then performed at L4-5.  Disc patient then prepared for interbody fusion.  With the disc base distracted the disc base was cleaned of soft tissue.  A 9 mm Medtronic expandable cage was then impacted in the place and expanded.  Distractor removed patient's right side.  The space prepared on the right side.  Morselized autograft packed in the interspace.  Second cage was then impacted in place and expanded.  Pedicle screws at L3 and L4 were left in place.  NuVasive screws were then placed at L4 on the right and L5 bilaterally.  Each screw was placed by identifying the pedicle using surface landmarks and intraoperative fluoroscopy with superficial bone around the pedicle was then removed using high-speed drill.  Pedicle was then probed using a pedicle awl.  Each pedicle tract was then probed and found to be solidly within the bone.  Each pedicle tract was then tapped with a screw tap.  Screw triple was probed and found to be solidly within the bone.  6.5 mm NuVasive screws were placed at L4 on the right and bilaterally at L5.  Final images revealed good position of the cages and the hardware at the proper level with normal alignment of the spine.  Wound was then irrigated.  Each cage was then packed with graft on putty.  Gelfoam was placed  over the laminotomy defects.  Transverse processes were decorticated.  Morselized autograft was packed posterior laterally.  Rods were then placed  over the screw heads in the left at L3-4-5 and on the right over L4-5.  Locking Suture of the screws.  Each locking caps then engaged with the construct under mild compression.  Vancomycin powder was placed in the deep wound space.  Wound is then closed in layers with Vicryl sutures.  Steri-Strips and sterile dressing were applied.  No apparent complications.  Patient tolerated the procedure well and she returns to the recovery room postop.

## 2022-08-13 NOTE — H&P (Signed)
Cheryl Cooper is an 82 y.o. female.   Chief Complaint: Back pain HPI: 82 year old female with back and right lower extremity pain numbness weakness with a right-sided L4 radiculopathy which is failed conservative management.  She is status post prior L3-4 decompression and fusion.  Patient has significant disc degeneration with associated spondylosis and stenosis with particularly severe foraminal stenosis on the right at L4-5.  She presents now for L4-5 decompression and fusion in hopes improving her symptoms.  Past Medical History:  Diagnosis Date   Anxiety    Arthritis    Carpal tunnel syndrome    Deafness in left ear    Depression    Diabetes mellitus    diet controlled   Diverticulitis    GERD (gastroesophageal reflux disease)    Hypercholesteremia    Hypertension    Hypothyroid    Memory loss    reports d/t concussion   PONV (postoperative nausea and vomiting) yrs ago, none recent   Post concussion syndrome    S/P TAVR (transcatheter aortic valve replacement) 11/13/2021   s/p TAVR with a 23 mm Edwards S3UR via the TF approach by Dr. Excell Seltzer and Dr. Leafy Ro   Severe aortic stenosis    Stroke [facility])     Past Surgical History:  Procedure Laterality Date   ABDOMINAL HYSTERECTOMY  1986   ANTERIOR LATERAL LUMBAR FUSION WITH PERCUTANEOUS SCREW 1 LEVEL Left 01/25/2022   Procedure: ANTERIOR LATERAL LUMBAR FUSION WITH PERCUTANEOUS SCREW LUMBAR THREE-FOUR;  Surgeon: Julio Sicks, MD;  Location: [facility] OR;  Service: Neurosurgery;  Laterality: Left;   BACK SURGERY  2010   lower   BIOPSY  01/12/2018   Procedure: BIOPSY;  Surgeon: Meridee Score Netty Starring., MD;  Location: [facility];  Service: Gastroenterology;;   CARDIAC CATHETERIZATION     CARPAL TUNNEL RELEASE Right 2023   CHOLECYSTECTOMY     ESOPHAGOGASTRODUODENOSCOPY (EGD) WITH PROPOFOL N/A 01/12/2018   Procedure: ESOPHAGOGASTRODUODENOSCOPY (EGD) WITH PROPOFOL;  Surgeon: Lemar Lofty., MD;  Location: [facility];  Service:  Gastroenterology;  Laterality: N/A;   EYE SURGERY Right    cataract   INTRAOPERATIVE TRANSTHORACIC ECHOCARDIOGRAM N/A 11/13/2021   Procedure: INTRAOPERATIVE TRANSTHORACIC ECHOCARDIOGRAM;  Surgeon: Tonny Bollman, MD;  Location: [facility] OR;  Service: Open Heart Surgery;  Laterality: N/A;   left elobow surgery  2003   left rotator cuff  2001   LUMBAR LAMINECTOMY/DECOMPRESSION MICRODISCECTOMY Left 12/23/2017   Procedure: Laminectomy for facet/synovial cyst - left - Lumbar three-Lumbar four;  Surgeon: Julio Sicks, MD;  Location: [facility] OR;  Service: Neurosurgery;  Laterality: Left;   r foot surgery  1995   REVERSE SHOULDER ARTHROPLASTY Left 10/23/2018   Procedure: REVERSE TOTAL SHOULDER ARTHROPLASTY;  Surgeon: Beverely Low, MD;  Location: [facility] ORS;  Service: Orthopedics;  Laterality: Left;  interscalene block   REVERSE SHOULDER ARTHROPLASTY Right 04/20/2021   Procedure: REVERSE SHOULDER ARTHROPLASTY;  Surgeon: Beverely Low, MD;  Location: [facility] ORS;  Service: Orthopedics;  Laterality: Right;  with ISB   right hand surgery  2001   RIGHT HEART CATH AND CORONARY ANGIOGRAPHY N/A 10/24/2021   Procedure: RIGHT HEART CATH AND CORONARY ANGIOGRAPHY;  Surgeon: Tonny Bollman, MD;  Location: [facility] INVASIVE CV LAB;  Service: Cardiovascular;  Laterality: N/A;   right index finger surgery  2009   SHOULDER OPEN ROTATOR CUFF REPAIR  11/21/2011   Procedure: ROTATOR CUFF REPAIR SHOULDER OPEN;  Surgeon: Javier Docker, MD;  Location: [facility] ORS;  Service: Orthopedics;  Laterality: Right;  with subacromial decompression   SHOULDER OPEN ROTATOR  CUFF REPAIR Right 05/09/2016   Procedure: Right shoulder mini open revision rotator cuff repair, subacromial decompression;  Surgeon: Jene Every, MD;  Location: [facility] ORS;  Service: Orthopedics;  Laterality: Right;   TOTAL HIP ARTHROPLASTY Right 04/24/2017   Procedure: RIGHT TOTAL HIP ARTHROPLASTY ANTERIOR APPROACH;  Surgeon: Samson Frederic, MD;  Location: [facility] ORS;  Service: Orthopedics;   Laterality: Right;  Needs RNFA   TRANSCATHETER AORTIC VALVE REPLACEMENT, TRANSFEMORAL N/A 11/13/2021   Procedure: Transcatheter Aortic Valve Replacement, Transfemoral Edwards 23 MM SAPIEN 3 Ultra;  Surgeon: Tonny Bollman, MD;  Location: [facility] OR;  Service: Open Heart Surgery;  Laterality: N/A;  Transfemoral approach    Family History  Problem Relation Age of Onset   Heart attack Mother    Stroke Father    Heart attack Father    Stroke Sister    Stroke Brother    Diabetes Brother    Dementia Brother    Heart disease Brother    Stroke Sister        TIA   Social History:  reports that she has never smoked. She has never used smokeless tobacco. She reports that she does not currently use alcohol. She reports that she does not use drugs.  Allergies:  Allergies  Allergen Reactions   Other Other (See Comments) and Rash    Tomato sauce, garlic, onion - severe acid reflux    Flexeril [Cyclobenzaprine] Other (See Comments)    Per spouse "she felt like a zombie"   Chlordiazepoxide-Clidinium Other (See Comments)    Dizziness (intolerance)   Codeine Nausea Only and Rash   Lipitor [Atorvastatin] Other (See Comments)    Unbalanced    Medications Prior to Admission  Medication Sig Dispense Refill   amLODipine (NORVASC) 5 MG tablet TAKE 1 AND 1/2 TABLETS BY MOUTH DAILY 135 tablet 2   aspirin 81 MG chewable tablet Chew 1 tablet (81 mg total) by mouth daily.     Calcium Carbonate-Vit D-Min (CALCIUM 600+D PLUS MINERALS PO) Take 2 tablets by mouth daily.     escitalopram (LEXAPRO) 20 MG tablet TAKE 1 TABLET BY MOUTH DAILY 90 tablet 0   esomeprazole (NEXIUM) 40 MG capsule TAKE 1 CAPSULE BY MOUTH TWICE A DAY BEFORE A MEAL 180 capsule 0   estradiol (ESTRACE VAGINAL) 0.1 MG/GM vaginal cream Place 1 g vaginally 2 (two) times a week. Initial dose: Apply externally nightly x 2 weeks, then twice weekly. 42.5 g 0   ezetimibe (ZETIA) 10 MG tablet Take 1 tablet (10 mg total) by mouth daily. 90 tablet 2    HYDROcodone-acetaminophen (NORCO/VICODIN) 5-325 MG tablet Take 1 tablet by mouth every 8 (eight) hours as needed for moderate pain.     Multiple Vitamins-Minerals (WOMENS MULTI GUMMIES PO) Take 2 tablets by mouth daily.     oxybutynin (DITROPAN XL) 15 MG 24 hr tablet TAKE 1 TABLET BY MOUTH AT BEDTIME 90 tablet 1   Probiotic Product (PROBIOTIC PO) Take 2 capsules by mouth daily.     rosuvastatin (CRESTOR) 10 MG tablet TAKE ONE TABLET BY MOUTH DAILY 90 tablet 2   traZODone (DESYREL) 50 MG tablet Take 0.5 tablets (25 mg total) by mouth at bedtime. 45 tablet 1   amoxicillin (AMOXIL) 500 MG tablet Take 2,000 mg by mouth as directed. Take 4 capsules (2000 mg) by mouth 1 hour prior to dental appointments     glucose blood test strip daily.     levothyroxine (SYNTHROID) 75 MCG tablet Take 1 tablet (75 mcg total) by mouth  daily with breakfast. 90 tablet 2    Results for orders placed or performed during the hospital encounter of 08/13/22 (from the past 48 hour(s))  Glucose, capillary     Status: Abnormal   Collection Time: 08/13/22  6:37 AM  Result Value Ref Range   Glucose-Capillary 116 (H) 70 - 99 mg/dL    Comment: Glucose reference range applies only to samples taken after fasting for at least 8 hours.   No results found.    Blood pressure 139/83, pulse 88, temperature 98.8 F (37.1 C), temperature source Oral, resp. rate 20, height 5\' 3"  (1.6 m), weight 63.5 kg, SpO2 99 %.  Patient is awake and alert.  She is oriented and appropriate.  Speech is fluent.  Judgment insight are intact.  Cranial nerve function normal bilateral.  Motor examination reveals some moderate weakness of her right quadriceps muscle group otherwise motor strength intact.  Sensory examination with decrease sensation pinprick and light touch in her right L4 and L5 dermatomes.  Deep tendon reflexes normal active.  No evidence of long track signs.  Gait is antalgic.  Posture is reasonably normal peer examination head ears eyes  nose throat is unremarked.  Chest and abdomen are benign.  Extremities are free of major deformity. Assessment/Plan L4-5 adjacent level spondylosis and stenosis with severe foraminal stenosis and ongoing radiculopathy.  Plan L4-5 bilateral decompressive laminotomies and foraminotomies with posterior lumbar interbody fusion utilizing interbody cages, low curves that autograft, and augmented with posterior load arthrodesis utilizing nonsegmental pedicle screw fixation and local autografting.  Risks and benefits been explained.  Patient wishes to proceed.  Kathaleen Maser Kassadie Pancake 08/13/2022, 7:57 AM

## 2022-08-13 NOTE — Transfer of Care (Signed)
Immediate Anesthesia Transfer of Care Note  Patient: Cheryl Cooper  Procedure(s) Performed: Posterior Lumbar Interbody Fusion Lumbar Four-Lumbar Five (Back)  Patient Location: PACU  Anesthesia Type:General  Level of Consciousness: awake, alert , and oriented  Airway & Oxygen Therapy: Patient Spontanous Breathing and Patient connected to face mask oxygen  Post-op Assessment: Report given to RN, Post -op Vital signs reviewed and stable, Patient moving all extremities X 4, and Patient able to stick tongue midline  Post vital signs: Reviewed  Last Vitals:  Vitals Value Taken Time  BP 129/75 08/13/22 1051  Temp 97.2   Pulse 68 08/13/22 1052  Resp 19 08/13/22 1052  SpO2 95 % 08/13/22 1052  Vitals shown include unvalidated device data.  Last Pain:  Vitals:   08/13/22 0755  TempSrc: Oral  PainSc:          Complications: No notable events documented.

## 2022-08-13 NOTE — Anesthesia Postprocedure Evaluation (Signed)
Anesthesia Post Note  Patient: Cheryl Cooper  Procedure(s) Performed: Posterior Lumbar Interbody Fusion Lumbar Four-Lumbar Five (Back)     Patient location during evaluation: PACU Anesthesia Type: General Level of consciousness: sedated Pain management: pain level controlled Vital Signs Assessment: post-procedure vital signs reviewed and stable Respiratory status: spontaneous breathing and respiratory function stable Cardiovascular status: stable Postop Assessment: no apparent nausea or vomiting Anesthetic complications: no  No notable events documented.  Last Vitals:  Vitals:   08/13/22 1130 08/13/22 1145  BP: (!) 118/48 (!) 120/58  Pulse: 74 75  Resp: 19 20  Temp:  (!) 36.4 C  SpO2: 93% 93%    Last Pain:  Vitals:   08/13/22 1121  TempSrc:   PainSc: 5                  Hugh Kamara DANIEL

## 2022-08-13 NOTE — Anesthesia Procedure Notes (Signed)
Procedure Name: Intubation Date/Time: 08/13/2022 8:17 AM  Performed by: Cy Blamer, CRNAPre-anesthesia Checklist: Patient identified, Emergency Drugs available, Suction available and Patient being monitored Patient Re-evaluated:Patient Re-evaluated prior to induction Oxygen Delivery Method: Circle system utilized Preoxygenation: Pre-oxygenation with 100% oxygen Induction Type: IV induction Ventilation: Mask ventilation without difficulty Laryngoscope Size: Mac and 3 Grade View: Grade II Tube type: Oral Tube size: 7.0 mm Number of attempts: 1 Airway Equipment and Method: Stylet and Bite block Placement Confirmation: ETT inserted through vocal cords under direct vision, positive ETCO2 and breath sounds checked- equal and bilateral Secured at: 21 cm Tube secured with: Tape Dental Injury: Teeth and Oropharynx as per pre-operative assessment  Comments: Intubation performed by SRNA using BURP maneuver

## 2022-08-13 NOTE — Anesthesia Procedure Notes (Signed)
Arterial Line Insertion Start/End6/25/2024 7:15 AM, 08/13/2022 7:18 AM Performed by: Dorie Rank, CRNA, CRNA  Patient location: Pre-op. Preanesthetic checklist: patient identified, IV checked, site marked, risks and benefits discussed, surgical consent, monitors and equipment checked, pre-op evaluation, timeout performed and anesthesia consent Lidocaine 1% used for infiltration Left, radial was placed Catheter size: 20 G Hand hygiene performed  and maximum sterile barriers used  Allen's test indicative of satisfactory collateral circulation Attempts: 1 Procedure performed without using ultrasound guided technique. Following insertion, Biopatch. Post procedure assessment: normal and unchanged  Patient tolerated the procedure well with no immediate complications. Additional procedure comments: 2 prior attempts to left radial artery by SRNA.

## 2022-08-13 NOTE — Progress Notes (Signed)
Orthopedic Tech Progress Note Patient Details:  Cheryl Cooper July 25, 1940 [id number]  RN stated patient has back brace   Patient ID: Cheryl Cooper, female   DOB: [birth year], 82 y.o.   MRN: [id number]  Donald Pore 08/13/2022, 12:47 PM

## 2022-08-13 NOTE — Progress Notes (Signed)
Pt transferred to 6N32. All belongings were sent with the Pt. Pt is stable @ transfer. Rema Fendt, RN

## 2022-08-13 NOTE — Brief Op Note (Signed)
08/13/2022  10:50 AM  PATIENT:  Tessie Eke  82 y.o. female  PRE-OPERATIVE DIAGNOSIS:  Stenosis  POST-OPERATIVE DIAGNOSIS:  Stenosis  PROCEDURE:  Procedure(s): Posterior Lumbar Interbody Fusion Lumbar Four-Lumbar Five (N/A)  SURGEON:  Surgeon(s) and Role:    * Julio Sicks, MD - Primary  PHYSICIAN ASSISTANT:   ASSISTANTSMarland Mcalpine   ANESTHESIA:   general  EBL:  100 mL   BLOOD ADMINISTERED:none  DRAINS: none   LOCAL MEDICATIONS USED:  MARCAINE     SPECIMEN:  No Specimen  DISPOSITION OF SPECIMEN:  N/A  COUNTS:  YES  TOURNIQUET:  * No tourniquets in log *  DICTATION: .Dragon Dictation  PLAN OF CARE: Admit for overnight observation  PATIENT DISPOSITION:  PACU - hemodynamically stable.   Delay start of Pharmacological VTE agent (>24hrs) due to surgical blood loss or risk of bleeding: yes

## 2022-08-14 DIAGNOSIS — E039 Hypothyroidism, unspecified: Secondary | ICD-10-CM | POA: Diagnosis not present

## 2022-08-14 DIAGNOSIS — M48061 Spinal stenosis, lumbar region without neurogenic claudication: Secondary | ICD-10-CM | POA: Diagnosis not present

## 2022-08-14 DIAGNOSIS — M5416 Radiculopathy, lumbar region: Secondary | ICD-10-CM | POA: Diagnosis not present

## 2022-08-14 DIAGNOSIS — Z8673 Personal history of transient ischemic attack (TIA), and cerebral infarction without residual deficits: Secondary | ICD-10-CM | POA: Diagnosis not present

## 2022-08-14 DIAGNOSIS — Z96641 Presence of right artificial hip joint: Secondary | ICD-10-CM | POA: Diagnosis not present

## 2022-08-14 DIAGNOSIS — M4316 Spondylolisthesis, lumbar region: Secondary | ICD-10-CM | POA: Diagnosis not present

## 2022-08-14 DIAGNOSIS — E119 Type 2 diabetes mellitus without complications: Secondary | ICD-10-CM | POA: Diagnosis not present

## 2022-08-14 DIAGNOSIS — I10 Essential (primary) hypertension: Secondary | ICD-10-CM | POA: Diagnosis not present

## 2022-08-14 DIAGNOSIS — Z96612 Presence of left artificial shoulder joint: Secondary | ICD-10-CM | POA: Diagnosis not present

## 2022-08-14 MED ORDER — OXYCODONE HCL 10 MG PO TABS: 10.0000 mg | ORAL_TABLET | ORAL | 0 refills | Status: DC | PRN

## 2022-08-14 MED ORDER — METHOCARBAMOL 500 MG PO TABS: 500.0000 mg | ORAL_TABLET | Freq: Four times a day (QID) | ORAL | 1 refills | Status: DC | PRN

## 2022-08-14 NOTE — Plan of Care (Signed)

## 2022-08-14 NOTE — TOC Transition Note (Signed)
Transition of Care [facility]) - CM/SW Discharge Note   Patient Details  Name: Cheryl Cooper MRN: [id number] Date of Birth: 05-18-1940  [facility] CM/SW Contact:  Lawerance Sabal, RN Phone Number: 08/14/2022, 10:48 AM   Clinical Narrative:     Sherron Monday w patient at bedside.  She states that she has RW, cane, 3/1 at home.  No HH needs identified.  No other TOC needs identified.   Final next level of care: Home/Self Care Barriers to Discharge: No Barriers Identified   Patient Goals and CMS Choice      Discharge Placement                         Discharge Plan and Services Additional resources added to the After Visit Summary for                  DME Arranged: N/A         HH Arranged: NA          Social Determinants of Health (SDOH) Interventions SDOH Screenings   Food Insecurity: No Food Insecurity (04/09/2022)  Housing: Low Risk  (04/09/2022)  Transportation Needs: No Transportation Needs (04/09/2022)  Utilities: Not At Risk (01/25/2022)  Alcohol Screen: Low Risk  (10/05/2018)  Depression (PHQ2-9): Low Risk  (04/09/2022)  Tobacco Use: Low Risk  (08/13/2022)     Readmission Risk Interventions     No data to display

## 2022-08-14 NOTE — Discharge Summary (Signed)
Physician Discharge Summary  Patient ID: Cheryl Cooper MRN: [id number] DOB/AGE: [birth year] 82 y.o.  Admit date: 08/13/2022 Discharge date: 08/14/2022  Admission Diagnoses:  Discharge Diagnoses:  Principal Problem:   Lumbar adjacent segment disease with spondylolisthesis   Discharged Condition: good  Hospital Course: Patient mid to the hospital where she went uncomplicated L4-5 decompression and fusion.  Postoperative doing well.  Preoperative back and lower extremity pain is significantly improved.  Standing ambulating and voiding without difficulty.  Ready for discharge home.  Consults:   Significant Diagnostic Studies:   Treatments:   Discharge Exam: Blood pressure 133/60, pulse 65, temperature 98.4 F (36.9 C), temperature source Oral, resp. rate 18, height 5\' 3"  (1.6 m), weight 63.5 kg, SpO2 92 %. Awake and alert.  Oriented and appropriate.  Motor and sensory function intact.  Wound clean and dry.  Chest and abdomen benign.  Disposition: Discharge disposition: 01-Home or Self Care        Allergies as of 08/14/2022       Reactions   Other Other (See Comments), Rash   Tomato sauce, garlic, onion - severe acid reflux    Flexeril [cyclobenzaprine] Other (See Comments)   Per spouse "she felt like a zombie"   Chlordiazepoxide-clidinium Other (See Comments)   Dizziness (intolerance)   Codeine Nausea Only, Rash   Lipitor [atorvastatin] Other (See Comments)   Unbalanced        Medication List     TAKE these medications    amLODipine 5 MG tablet Commonly known as: NORVASC TAKE 1 AND 1/2 TABLETS BY MOUTH DAILY   amoxicillin 500 MG tablet Commonly known as: AMOXIL Take 2,000 mg by mouth as directed. Take 4 capsules (2000 mg) by mouth 1 hour prior to dental appointments   aspirin 81 MG chewable tablet Chew 1 tablet (81 mg total) by mouth daily.   CALCIUM 600+D PLUS MINERALS PO Take 2 tablets by mouth daily.   escitalopram 20 MG tablet Commonly known as:  LEXAPRO TAKE 1 TABLET BY MOUTH DAILY   esomeprazole 40 MG capsule Commonly known as: NEXIUM TAKE 1 CAPSULE BY MOUTH TWICE A DAY BEFORE A MEAL   estradiol 0.1 MG/GM vaginal cream Commonly known as: ESTRACE VAGINAL Place 1 g vaginally 2 (two) times a week. Initial dose: Apply externally nightly x 2 weeks, then twice weekly.   ezetimibe 10 MG tablet Commonly known as: ZETIA Take 1 tablet (10 mg total) by mouth daily.   glucose blood test strip daily.   HYDROcodone-acetaminophen 5-325 MG tablet Commonly known as: NORCO/VICODIN Take 1 tablet by mouth every 8 (eight) hours as needed for moderate pain.   levothyroxine 75 MCG tablet Commonly known as: SYNTHROID Take 1 tablet (75 mcg total) by mouth daily with breakfast.   methocarbamol 500 MG tablet Commonly known as: ROBAXIN Take 1 tablet (500 mg total) by mouth every 6 (six) hours as needed for muscle spasms.   oxybutynin 15 MG 24 hr tablet Commonly known as: DITROPAN XL TAKE 1 TABLET BY MOUTH AT BEDTIME   Oxycodone HCl 10 MG Tabs Take 1 tablet (10 mg total) by mouth every 3 (three) hours as needed for severe pain ((score 7 to 10)).   PROBIOTIC PO Take 2 capsules by mouth daily.   rosuvastatin 10 MG tablet Commonly known as: CRESTOR TAKE ONE TABLET BY MOUTH DAILY   traZODone 50 MG tablet Commonly known as: DESYREL Take 0.5 tablets (25 mg total) by mouth at bedtime.   WOMENS MULTI GUMMIES PO Take 2  tablets by mouth daily.               Durable Medical Equipment  (From admission, onward)           Start     Ordered   08/13/22 1155  DME Walker rolling  Once       Question:  Patient needs a walker to treat with the following condition  Answer:  Degenerative spondylolisthesis   08/13/22 1154   08/13/22 1155  DME 3 n 1  Once        08/13/22 1154             Signed: Kathaleen Maser Casmira Cramer 08/14/2022, 10:33 AM

## 2022-08-14 NOTE — Evaluation (Signed)
Occupational Therapy Evaluation Patient Details Name: Cheryl Cooper MRN: [id number] DOB: 22-Feb-1940 Today's Date: 08/14/2022   History of Present Illness Pt is an 82 y.o. female s/p PLIF. PMH: h/o L3/4 ALIF (Dec 2023), carpal tunnel syndrome, L ear deafness, DM, HTN, hypothyroidism, post concussive syndrome, TAVR 2023, bilat rotator cuff sx, R THA 2019   Clinical Impression   Patient evaluated by Occupational Therapy with no further acute OT needs identified. All education has been completed and the patient has no further questions. Provided education regarding back precautions, use of back brace and when it should be worn, compensatory techniques for ADL completion. Pt verbalized understanding. No follow-up Occupational Therapy or equipment needs. OT is signing off. Thank you for this referral.       Recommendations for follow up therapy are one component of a multi-disciplinary discharge planning process, led by the attending physician.  Recommendations may be updated based on patient status, additional functional criteria and insurance authorization.   Assistance Recommended at Discharge Set up Supervision/Assistance  Patient can return home with the following A little help with bathing/dressing/bathroom;Help with stairs or ramp for entrance;Assist for transportation;Assistance with cooking/housework    Functional Status Assessment  Patient has had a recent decline in their functional status and demonstrates the ability to make significant improvements in function in a reasonable and predictable amount of time.  Equipment Recommendations  None recommended by OT       Precautions / Restrictions Precautions Precautions: Back;Fall Precaution Booklet Issued: Yes (comment) Precaution Comments: Provided handout along with verbal education. Pt verbalized understanding. Required Braces or Orthoses: Spinal Brace Spinal Brace: Lumbar corset;Applied in sitting position Restrictions Weight  Bearing Restrictions: No      Mobility Bed Mobility Overal bed mobility: Needs Assistance Bed Mobility: Sit to Sidelying, Rolling Rolling: Min guard       Sit to sidelying: Min guard General bed mobility comments: VC, tactile cues, and visual demonstration provided for bed mobility while transitioning from sit to sidelying in order to maintain back precautions. No bed rail used with bed flat to imitate home environment. Patient Response: Cooperative  Transfers Overall transfer level: Needs assistance Equipment used: Rolling walker (2 wheels) Transfers: Sit to/from Stand, Bed to chair/wheelchair/BSC Sit to Stand: Supervision     Step pivot transfers: Supervision     General transfer comment: VC provided for hand placement during RW management. Physical assist and VC to bring RW straight towards sink when washing hands versus sideways to maintain back precautions      Balance Overall balance assessment: Needs assistance Sitting-balance support: No upper extremity supported, Feet supported Sitting balance-Leahy Scale: Good Sitting balance - Comments: sitting EOB   Standing balance support: Bilateral upper extremity supported, During functional activity Standing balance-Leahy Scale: Fair Standing balance comment: Able to wash hands at sink without relying on RW or sink to maintain balance.                ADL either performed or assessed with clinical judgement   ADL                   General ADL Comments: Min A will be needed for LB dressing/bathing, and UB dressing (brace management) d/t recent back surgery. Toileting, grooming: Mod I, Set-up needed for UB bathing     Vision Baseline Vision/History: 1 Wears glasses (readers) Ability to See in Adequate Light: 0 Adequate Patient Visual Report: No change from baseline Vision Assessment?: No apparent visual deficits  Pertinent Vitals/Pain Pain Assessment Pain Assessment: 0-10 Pain Score: 5  Pain  Location: back Pain Descriptors / Indicators: Discomfort, Grimacing, Sore Pain Intervention(s): Limited activity within patient's tolerance, Monitored during session, Patient requesting pain meds-RN notified, Repositioned     Hand Dominance Right   Extremity/Trunk Assessment Upper Extremity Assessment Upper Extremity Assessment: Overall WFL for tasks assessed   Lower Extremity Assessment Lower Extremity Assessment: Defer to PT evaluation   Cervical / Trunk Assessment Cervical / Trunk Assessment: Back Surgery   Communication Communication Communication: No difficulties   Cognition Arousal/Alertness: Awake/alert Behavior During Therapy: WFL for tasks assessed/performed Overall Cognitive Status: Within Functional Limits for tasks assessed                             Home Living Family/patient expects to be discharged to:: Private residence Living Arrangements: Spouse/significant other Available Help at Discharge: Family;Available 24 hours/day Type of Home: House Home Access: Stairs to enter [employer] of Steps: 2 Entrance Stairs-Rails: Left Home Layout: One level     Bathroom Shower/Tub: Producer, television/film/video: Handicapped height     Home Equipment: Shower seat - built Char[employer] fundraiser (2 wheels);Rollator (4 wheels);Cane - quad;BSC/3in1          Prior Functioning/Environment Prior Level of Function : Independent/Modified Independent;History of Falls (last six months)      Mobility Comments: rollator for ambulation ADLs Comments: Pt reports that prior to her first surgery and afterwards, she required assist with LB dressing/bathing PRN d/t back pain.        OT Problem List: Impaired balance (sitting and/or standing);Decreased knowledge of precautions      OT Treatment/Interventions:   Self care/home management   OT Goals(Current goals can be found in the care plan section) Acute Rehab OT Goals Patient Stated Goal: to lay down  in bed.  OT Frequency:  1X visit       AM-PAC OT "6 Clicks" Daily Activity     Outcome Measure Help from another person eating meals?: None Help from another person taking care of personal grooming?: A Little Help from another person toileting, which includes using toliet, bedpan, or urinal?: None Help from another person bathing (including washing, rinsing, drying)?: A Little Help from another person to put on and taking off regular upper body clothing?: A Little Help from another person to put on and taking off regular lower body clothing?: A Little 6 Click Score: 20   End of Session Equipment Utilized During Treatment: Rolling walker (2 wheels);Back brace Nurse Communication: Patient requests pain meds  Activity Tolerance: Patient tolerated treatment well Patient left: in bed;with call bell/phone within reach;with SCD's reapplied  OT Visit Diagnosis: Muscle weakness (generalized) (M62.81);History of falling (Z91.81)                Time: 4098-1191 OT Time Calculation (min): 24 min Charges:  OT General Charges $OT Visit: 1 Visit OT Evaluation $OT Eval Moderate Complexity: 1 Mod OT Treatments $Self Care/Home Management : 8-22 mins  Limmie Patricia, OTR/L,CBIS  Supplemental OT - [facility] and [facility] Secure Chat Preferred     Traven Davids, Charisse March 08/14/2022, 10:13 AM

## 2022-08-14 NOTE — Evaluation (Signed)
Physical Therapy Evaluation Patient Details Name: Cheryl Cooper MRN: [id number] DOB: Jul 21, 1940 Today's Date: 08/14/2022  History of Present Illness  Pt is an 82 y.o. female s/p PLIF. PMH: h/o L3/4 ALIF (Dec 2023), carpal tunnel syndrome, L ear deafness, DM, HTN, hypothyroidism, post concussive syndrome, TAVR 2023, bilat rotator cuff sx, R THA 2019   Clinical Impression  PT eval complete. Pt required min guard assist bed mobility, transfers, and amb 200' with RW. Ascend/descend 3 steps with L rail min assist. Pt educated on back precautions and brace wear schedule. Pt's husband able to provide needed level of assist at home. Pt reports plan for d/c home today. No follow up services indicated. PT signing off.        Recommendations for follow up therapy are one component of a multi-disciplinary discharge planning process, led by the attending physician.  Recommendations may be updated based on patient status, additional functional criteria and insurance authorization.  Follow Up Recommendations       Assistance Recommended at Discharge Frequent or constant Supervision/Assistance  Patient can return home with the following  A little help with walking and/or transfers;A little help with bathing/dressing/bathroom;Assistance with cooking/housework;Help with stairs or ramp for entrance;Assist for transportation    Equipment Recommendations None recommended by PT  Recommendations for Other Services       Functional Status Assessment Patient has had a recent decline in their functional status and demonstrates the ability to make significant improvements in function in a reasonable and predictable amount of time.     Precautions / Restrictions Precautions Precautions: Back;Fall Precaution Comments: Educated on 3/3 back precautions. Required Braces or Orthoses: Spinal Brace Spinal Brace: Lumbar corset;Applied in sitting position      Mobility  Bed Mobility Overal bed mobility: Needs  Assistance Bed Mobility: Rolling, Sidelying to Sit Rolling: Modified independent (Device/Increase time) Sidelying to sit: Min guard, HOB elevated       General bed mobility comments: +rail, cues for logroll    Transfers Overall transfer level: Needs assistance Equipment used: Rolling walker (2 wheels) Transfers: Sit to/from Stand Sit to Stand: Min guard                Ambulation/Gait Ambulation/Gait assistance: Min guard Gait Distance (Feet): 200 Feet Assistive device: Rolling walker (2 wheels) Gait Pattern/deviations: Step-through pattern, Decreased stride length Gait velocity: decreased Gait velocity interpretation: <1.31 ft/sec, indicative of household ambulator   General Gait Details: slow, steady gait with RW  Stairs Stairs: Yes Stairs assistance: Min assist Stair Management: One rail Left, Sideways, Step to pattern Number of Stairs: 3 General stair comments: cues for sequencing. Pt's husband to assist in/out of house.  Wheelchair Mobility    Modified Rankin (Stroke Patients Only)       Balance Overall balance assessment: Needs assistance Sitting-balance support: No upper extremity supported, Feet supported Sitting balance-Leahy Scale: Good     Standing balance support: Bilateral upper extremity supported, During functional activity, Reliant on assistive device for balance, Single extremity supported Standing balance-Leahy Scale: Poor                               Pertinent Vitals/Pain Pain Assessment Pain Assessment: 0-10 Pain Score: 5  Pain Location: back Pain Descriptors / Indicators: Discomfort, Grimacing, Sore Pain Intervention(s): Monitored during session, Repositioned    Home Living Family/patient expects to be discharged to:: Private residence Living Arrangements: Spouse/significant other Available Help at Discharge: Family;Available 24 hours/day Type  of Home: House Home Access: Stairs to enter Entrance Stairs-Rails:  Left Entrance Stairs-Number of Steps: 2   Home Layout: One level Home Equipment: Shower seat - built [employer] fundraiser (2 wheels);Rollator (4 wheels);Cane - quad;BSC/3in1      Prior Function Prior Level of Function : Independent/Modified Independent;History of Falls (last six months)             Mobility Comments: rollator for amb ADLs Comments: Assist with lower body dressing occaisionally     Hand Dominance   Dominant Hand: Right    Extremity/Trunk Assessment   Upper Extremity Assessment Upper Extremity Assessment: Defer to OT evaluation    Lower Extremity Assessment Lower Extremity Assessment: Generalized weakness    Cervical / Trunk Assessment Cervical / Trunk Assessment: Back Surgery  Communication   Communication: No difficulties  Cognition Arousal/Alertness: Awake/alert Behavior During Therapy: WFL for tasks assessed/performed Overall Cognitive Status: Within Functional Limits for tasks assessed                                          General Comments      Exercises     Assessment/Plan    PT Assessment Patient does not need any further PT services  PT Problem List         PT Treatment Interventions      PT Goals (Current goals can be found in the Care Plan section)  Acute Rehab PT Goals Patient Stated Goal: home today PT Goal Formulation: All assessment and education complete, DC therapy    Frequency       Co-evaluation               AM-PAC PT "6 Clicks" Mobility  Outcome Measure Help needed turning from your back to your side while in a flat bed without using bedrails?: None Help needed moving from lying on your back to sitting on the side of a flat bed without using bedrails?: A Little Help needed moving to and from a bed to a chair (including a wheelchair)?: A Little Help needed standing up from a chair using your arms (e.g., wheelchair or bedside chair)?: A Little Help needed to walk in hospital room?: A  Little Help needed climbing 3-5 steps with a railing? : A Little 6 Click Score: 19    End of Session Equipment Utilized During Treatment: Gait belt;Back brace Activity Tolerance: Patient tolerated treatment well Patient left: in chair;with call bell/phone within reach Nurse Communication: Mobility status PT Visit Diagnosis: Other abnormalities of gait and mobility (R26.89);Pain    Time: 9528-4132 PT Time Calculation (min) (ACUTE ONLY): 20 min   Charges:   PT Evaluation $PT Eval Moderate Complexity: 1 Mod          Ferd Glassing., PT  Office # [phone]   Ilda Foil 08/14/2022, 8:50 AM

## 2022-08-14 NOTE — Discharge Instructions (Signed)

## 2022-08-15 ENCOUNTER — Telehealth: Payer: Self-pay

## 2022-08-15 NOTE — Transitions of Care (Post Inpatient/ED Visit) (Signed)
   08/15/2022  Name: Cheryl Cooper MRN: [id number] DOB: 28-Dec-1940  Today's TOC FU Call Status: Today's TOC FU Call Status:: Unsuccessul Call (1st Attempt) Unsuccessful Call (1st Attempt) Date: 08/15/22  Attempted to reach the patient regarding the most recent Inpatient/ED visit.  Follow Up Plan: Additional outreach attempts will be made to reach the patient to complete the Transitions of Care (Post Inpatient/ED visit) call.   Signature  Jailen Coward D, CMA

## 2022-08-19 ENCOUNTER — Other Ambulatory Visit: Payer: Self-pay | Admitting: Family Medicine

## 2022-08-19 ENCOUNTER — Encounter (HOSPITAL_COMMUNITY): Payer: Self-pay | Admitting: Neurosurgery

## 2022-08-19 DIAGNOSIS — E039 Hypothyroidism, unspecified: Secondary | ICD-10-CM

## 2022-08-23 MED FILL — Sodium Chloride IV Soln 0.9%: INTRAVENOUS | Qty: 2000 | Status: AC

## 2022-08-28 ENCOUNTER — Ambulatory Visit: Payer: Medicare PPO | Admitting: Nurse Practitioner

## 2022-09-06 ENCOUNTER — Other Ambulatory Visit: Payer: Self-pay

## 2022-09-06 DIAGNOSIS — F32A Depression, unspecified: Secondary | ICD-10-CM

## 2022-09-06 MED ORDER — ESCITALOPRAM OXALATE 20 MG PO TABS
20.0000 mg | ORAL_TABLET | Freq: Every day | ORAL | 2 refills | Status: DC
Start: 2022-09-06 — End: 2023-06-03

## 2022-09-06 NOTE — Telephone Encounter (Signed)
Patient called stating she needed a refill on her Lexapro.  Chart supports refilling and has upcoming appointment. Dm/cma

## 2022-09-11 ENCOUNTER — Encounter: Payer: Self-pay | Admitting: Nurse Practitioner

## 2022-09-11 ENCOUNTER — Ambulatory Visit (INDEPENDENT_AMBULATORY_CARE_PROVIDER_SITE_OTHER): Payer: Medicare PPO | Admitting: Nurse Practitioner

## 2022-09-11 DIAGNOSIS — M8580 Other specified disorders of bone density and structure, unspecified site: Secondary | ICD-10-CM | POA: Diagnosis not present

## 2022-09-11 DIAGNOSIS — Z780 Asymptomatic menopausal state: Secondary | ICD-10-CM

## 2022-09-11 DIAGNOSIS — Z01419 Encounter for gynecological examination (general) (routine) without abnormal findings: Secondary | ICD-10-CM | POA: Diagnosis not present

## 2022-09-11 DIAGNOSIS — N952 Postmenopausal atrophic vaginitis: Secondary | ICD-10-CM | POA: Diagnosis not present

## 2022-09-11 NOTE — Progress Notes (Signed)
   Cheryl Cooper 10/06/1940 [id number]   History:  81 y.o. [id number] presents for breast and pelvic exam. No GYN complaints. Postmenopausal. S/P 1986 TAH for DUB. Started vaginal estrogen in February for atrophic vaginitis. Stopped before her back surgery in June but plans to restart if symptoms return. Normal pap and mammogram history. T2DM, hypothyroidism, osteopenia managed by endocrinology. H/O osteopenia/multiple traumatic fractures (right hip, left shoulder, left forearm/elbow), managed by PCP, no longer on Fosamax.   Gynecologic History No LMP recorded. Patient has had a hysterectomy.   Contraception: status post hysterectomy Sexually active: No  Health Maintenance Last Pap: No longer screening per guidelines Last mammogram: 11/17/2020. Results were: Normal Last colonoscopy: 2018 Last Dexa: 2021  Past medical history, past surgical history, family history and social history were all reviewed and documented in the EPIC chart. Married. 1 son, 2 daughters.   ROS:  A ROS was performed and pertinent positives and negatives are included.  Exam:  There were no vitals filed for this visit.  There is no height or weight on file to calculate BMI.  General appearance:  Normal Thyroid:  Symmetrical, normal in size, without palpable masses or nodularity. Respiratory  Auscultation:  Clear without wheezing or rhonchi Cardiovascular  Auscultation:  Regular rate, without rubs, murmurs or gallops  Edema/varicosities:  Not grossly evident Abdominal  Soft,nontender, without masses, guarding or rebound.  Liver/spleen:  No organomegaly noted  Hernia:  None appreciated  Skin  Inspection:  Grossly normal Breasts: Examined lying and sitting.   Right: Without masses, retractions, nipple discharge or axillary adenopathy.   Left: Without masses, retractions, nipple discharge or axillary adenopathy. Genitourinary   Inguinal/mons:  Normal without inguinal adenopathy  External genitalia:  Normal  appearing vulva with no masses, tenderness, or lesions  BUS/Urethra/Skene's glands:  Normal  Vagina:  Normal appearing with normal color and discharge, no lesions. Atrophic changes  Cervix:  Absent  Uterus:  Absent  Anus and perineum: Normal  Digital rectal exam: Not indicated  Oswaldo Conroy, NP student present during visit.   Assessment/Plan:  82 y.o. [id number] for breast and pelvic exam.   Encounter for breast and pelvic examination- Education provided on SBEs, importance of preventative screenings, current guidelines, high calcium diet, regular exercise, and multivitamin daily. Labs done elsewhere.   Postmenopausal - no HRT. S/P TAH for DUB in 1986.  Osteopenia, unspecified location - Osteopenia/multiple traumatic fractures (right hip, left shoulder, left forearm/elbow), managed by PCP, no longer on Fosamax.   Vaginal atrophy - good management with vaginal estrogen. Stopped prior ot back surgery last month. Plans to restart if symptoms return. Will reach out if she needs refills.   Screening for cervical cancer - Normal Pap history.  No longer screening per guidelines.   Screening for breast cancer - Normal mammogram history.  Continue annual screenings.  Normal breast exam today.  Screening for colon cancer - 2018 colonoscopy. Will repeat at GI's recommended interval.   Return in 1 year for medication management if she restarts vaginal estrogen, otherwise 2 years for breast and pelvic exam.    Olivia Mackie DNP, 12:15 PM 09/11/2022

## 2022-09-26 DIAGNOSIS — M4310 Spondylolisthesis, site unspecified: Secondary | ICD-10-CM | POA: Diagnosis not present

## 2022-10-16 DIAGNOSIS — E039 Hypothyroidism, unspecified: Secondary | ICD-10-CM | POA: Diagnosis not present

## 2022-10-16 DIAGNOSIS — I10 Essential (primary) hypertension: Secondary | ICD-10-CM | POA: Diagnosis not present

## 2022-10-16 DIAGNOSIS — M8580 Other specified disorders of bone density and structure, unspecified site: Secondary | ICD-10-CM | POA: Diagnosis not present

## 2022-10-16 DIAGNOSIS — E119 Type 2 diabetes mellitus without complications: Secondary | ICD-10-CM | POA: Diagnosis not present

## 2022-10-23 DIAGNOSIS — L820 Inflamed seborrheic keratosis: Secondary | ICD-10-CM | POA: Diagnosis not present

## 2022-10-23 DIAGNOSIS — D485 Neoplasm of uncertain behavior of skin: Secondary | ICD-10-CM | POA: Diagnosis not present

## 2022-10-23 DIAGNOSIS — D225 Melanocytic nevi of trunk: Secondary | ICD-10-CM | POA: Diagnosis not present

## 2022-10-23 DIAGNOSIS — L821 Other seborrheic keratosis: Secondary | ICD-10-CM | POA: Diagnosis not present

## 2022-10-23 DIAGNOSIS — Z85828 Personal history of other malignant neoplasm of skin: Secondary | ICD-10-CM | POA: Diagnosis not present

## 2022-10-24 DIAGNOSIS — M4310 Spondylolisthesis, site unspecified: Secondary | ICD-10-CM | POA: Diagnosis not present

## 2022-11-04 ENCOUNTER — Emergency Department (HOSPITAL_BASED_OUTPATIENT_CLINIC_OR_DEPARTMENT_OTHER)
Admission: EM | Admit: 2022-11-04 | Discharge: 2022-11-04 | Disposition: A | Discharge status: Home / Self Care | Payer: Medicare PPO | Attending: Emergency Medicine | Admitting: Emergency Medicine

## 2022-11-04 ENCOUNTER — Emergency Department (HOSPITAL_BASED_OUTPATIENT_CLINIC_OR_DEPARTMENT_OTHER): Payer: Medicare PPO

## 2022-11-04 ENCOUNTER — Other Ambulatory Visit: Payer: Self-pay

## 2022-11-04 ENCOUNTER — Encounter (HOSPITAL_BASED_OUTPATIENT_CLINIC_OR_DEPARTMENT_OTHER): Payer: Self-pay | Admitting: Urology

## 2022-11-04 DIAGNOSIS — W1839XA Other fall on same level, initial encounter: Secondary | ICD-10-CM | POA: Insufficient documentation

## 2022-11-04 DIAGNOSIS — E119 Type 2 diabetes mellitus without complications: Secondary | ICD-10-CM | POA: Insufficient documentation

## 2022-11-04 DIAGNOSIS — Z79899 Other long term (current) drug therapy: Secondary | ICD-10-CM | POA: Insufficient documentation

## 2022-11-04 DIAGNOSIS — E039 Hypothyroidism, unspecified: Secondary | ICD-10-CM | POA: Diagnosis not present

## 2022-11-04 DIAGNOSIS — Z7982 Long term (current) use of aspirin: Secondary | ICD-10-CM | POA: Diagnosis not present

## 2022-11-04 DIAGNOSIS — S01511A Laceration without foreign body of lip, initial encounter: Secondary | ICD-10-CM | POA: Insufficient documentation

## 2022-11-04 DIAGNOSIS — I609 Nontraumatic subarachnoid hemorrhage, unspecified: Secondary | ICD-10-CM

## 2022-11-04 DIAGNOSIS — S066XAA Traumatic subarachnoid hemorrhage with loss of consciousness status unknown, initial encounter: Secondary | ICD-10-CM | POA: Insufficient documentation

## 2022-11-04 DIAGNOSIS — I10 Essential (primary) hypertension: Secondary | ICD-10-CM | POA: Insufficient documentation

## 2022-11-04 DIAGNOSIS — Z23 Encounter for immunization: Secondary | ICD-10-CM | POA: Diagnosis not present

## 2022-11-04 DIAGNOSIS — S01512A Laceration without foreign body of oral cavity, initial encounter: Secondary | ICD-10-CM

## 2022-11-04 DIAGNOSIS — M542 Cervicalgia: Secondary | ICD-10-CM | POA: Diagnosis not present

## 2022-11-04 DIAGNOSIS — S066X0A Traumatic subarachnoid hemorrhage without loss of consciousness, initial encounter: Secondary | ICD-10-CM | POA: Diagnosis not present

## 2022-11-04 DIAGNOSIS — S0990XA Unspecified injury of head, initial encounter: Secondary | ICD-10-CM | POA: Diagnosis not present

## 2022-11-04 DIAGNOSIS — S199XXA Unspecified injury of neck, initial encounter: Secondary | ICD-10-CM | POA: Diagnosis not present

## 2022-11-04 DIAGNOSIS — S0993XA Unspecified injury of face, initial encounter: Secondary | ICD-10-CM | POA: Diagnosis not present

## 2022-11-04 LAB — CBC WITH DIFFERENTIAL/PLATELET
Abs Immature Granulocytes: 0.04 10*3/uL (ref 0.00–0.07)
Basophils Absolute: 0.1 10*3/uL (ref 0.0–0.1)
Basophils Relative: 1 %
Eosinophils Absolute: 0.1 10*3/uL (ref 0.0–0.5)
Eosinophils Relative: 1 %
HCT: 40.5 % (ref 36.0–46.0)
Hemoglobin: 13.6 g/dL (ref 12.0–15.0)
Immature Granulocytes: 0 %
Lymphocytes Relative: 22 %
Lymphs Abs: 2.3 10*3/uL (ref 0.7–4.0)
MCH: 29.8 pg (ref 26.0–34.0)
MCHC: 33.6 g/dL (ref 30.0–36.0)
MCV: 88.8 fL (ref 80.0–100.0)
Monocytes Absolute: 0.6 10*3/uL (ref 0.1–1.0)
Monocytes Relative: 5 %
Neutro Abs: 7.5 10*3/uL (ref 1.7–7.7)
Neutrophils Relative %: 71 %
Platelets: 170 10*3/uL (ref 150–400)
RBC: 4.56 MIL/uL (ref 3.87–5.11)
RDW: 13.5 % (ref 11.5–15.5)
WBC: 10.5 10*3/uL (ref 4.0–10.5)
nRBC: 0 % (ref 0.0–0.2)

## 2022-11-04 LAB — BASIC METABOLIC PANEL
Anion gap: 9 (ref 5–15)
BUN: 14 mg/dL (ref 8–23)
CO2: 24 mmol/L (ref 22–32)
Calcium: 9.2 mg/dL (ref 8.9–10.3)
Chloride: 102 mmol/L (ref 98–111)
Creatinine, Ser: 0.91 mg/dL (ref 0.44–1.00)
GFR, Estimated: >60 mL/min (ref >60)
Glucose, Bld: 160 mg/dL — ABNORMAL HIGH (ref 70–99)
Potassium: 3.8 mmol/L (ref 3.5–5.1)
Sodium: 135 mmol/L (ref 135–145)

## 2022-11-04 MED ORDER — TETANUS-DIPHTH-ACELL PERTUSSIS 5-2.5-18.5 LF-MCG/0.5 IM SUSY
0.5000 mL | PREFILLED_SYRINGE | Freq: Once | INTRAMUSCULAR | Status: AC
  Administered 2022-11-04: 0.5 mL via INTRAMUSCULAR
  Filled 2022-11-04: qty 0.5

## 2022-11-04 MED ORDER — LIDOCAINE-EPINEPHRINE (PF) 2 %-1:200000 IJ SOLN
10.0000 mL | Freq: Once | INTRAMUSCULAR | Status: AC
  Administered 2022-11-04: 10 mL
  Filled 2022-11-04: qty 20

## 2022-11-04 NOTE — ED Triage Notes (Signed)
Pt states mechanical fall from ground level onto concrete just PTA  Lip lac and left eye swelling  Denies head injury or LOC but reports headache    NO blood thinners

## 2022-11-04 NOTE — Discharge Instructions (Addendum)
You were seen in the emergency room today after a fall.   Please hold your aspirin for 1 week then you are okay to resume.  You can take oxycodone as needed for pain.  Please call your surgeon and schedule follow-up appointment for today's visit.  Dr. Jordan Likes may want to see you sooner than your appointment on November 7.  Please call primary care and follow-up to ensure resolution of symptoms.  If you have worsening headache, confusion, or new and worsening symptoms please return to the emergency room immediately.

## 2022-11-04 NOTE — ED Provider Notes (Signed)
[place] [facility] Provider Note   CSN: [id number] Arrival date & time: 11/04/22  1240     History  Chief Complaint  Patient presents with   Fall   Facial Laceration    Cheryl Cooper is a 82 y.o. female w/ pmhx of stroke, spondylolithesis, AS, HTN, hypothyroid, GERD, DM presenting after fall from ground height onto concrete after losing her footing. Pt Is not sure if she was able to catch herself with her hands and wrist, she did find right wrist abrasion, patient does not fully recall the entire event.  She also had confusion after the event that resolved prior to arriving, reports that this fall occurred around 11:15 AM today.  Patient currently has headache to frontal area, nose pain, pain to her chin.  She is reporting laceration to interior lip. Pt denies LOC, not on blood thinners. Pt took baby ASA this AM.    Fall       Home Medications Prior to Admission medications   Medication Sig Start Date End Date Taking? Authorizing Provider  amLODipine (NORVASC) 5 MG tablet TAKE 1 AND 1/2 TABLETS BY MOUTH DAILY 06/14/22   Mliss Sax, MD  amoxicillin (AMOXIL) 500 MG tablet Take 2,000 mg by mouth as directed. Take 4 capsules (2000 mg) by mouth 1 hour prior to dental appointments    [provider]  aspirin 81 MG chewable tablet Chew 1 tablet (81 mg total) by mouth daily. 11/14/21   Janetta Hora, PA-C  Calcium Carbonate-Vit D-Min (CALCIUM 600+D PLUS MINERALS PO) Take 2 tablets by mouth daily.    [provider]  escitalopram (LEXAPRO) 20 MG tablet Take 1 tablet (20 mg total) by mouth daily. 09/06/22   Mliss Sax, MD  esomeprazole (NEXIUM) 40 MG capsule TAKE 1 CAPSULE BY MOUTH TWICE A DAY BEFORE A MEAL 08/02/22   Mliss Sax, MD  estradiol (ESTRACE VAGINAL) 0.1 MG/GM vaginal cream Place 1 g vaginally 2 (two) times a week. Initial dose: Apply externally nightly x 2 weeks, then twice  weekly. Patient not taking: Reported on 09/11/2022 07/30/22   Wyline Beady A, NP  ezetimibe (ZETIA) 10 MG tablet Take 1 tablet (10 mg total) by mouth daily. 02/22/22   Mliss Sax, MD  glucose blood test strip daily. 11/09/20   [provider]  HYDROcodone-acetaminophen (NORCO/VICODIN) 5-325 MG tablet Take 1 tablet by mouth every 8 (eight) hours as needed for moderate pain.    [provider]  levothyroxine (SYNTHROID) 75 MCG tablet TAKE 1 TABLET BY MOUTH DAILY WITH BREAKFAST 08/19/22   Mliss Sax, MD  methocarbamol (ROBAXIN) 500 MG tablet Take 1 tablet (500 mg total) by mouth every 6 (six) hours as needed for muscle spasms. 08/14/22   Julio Sicks, MD  Multiple Vitamins-Minerals (WOMENS MULTI GUMMIES PO) Take 2 tablets by mouth daily.    [provider]  oxybutynin (DITROPAN XL) 15 MG 24 hr tablet TAKE 1 TABLET BY MOUTH AT BEDTIME 05/14/22   Mliss Sax, MD  oxyCODONE 10 MG TABS Take 1 tablet (10 mg total) by mouth every 3 (three) hours as needed for severe pain ((score 7 to 10)). 08/14/22   Julio Sicks, MD  Probiotic Product (PROBIOTIC PO) Take 2 capsules by mouth daily.    [provider]  rosuvastatin (CRESTOR) 10 MG tablet TAKE ONE TABLET BY MOUTH DAILY 06/20/22   Mliss Sax, MD  traZODone (DESYREL) 50 MG tablet Take 0.5 tablets (  25 mg total) by mouth at bedtime. 06/03/22   Mliss Sax, MD      Allergies    Other, Flexeril [cyclobenzaprine], Chlordiazepoxide-clidinium, Codeine, and Lipitor [atorvastatin]    Review of Systems   Review of Systems  Physical Exam Updated Vital Signs Ht 5\' 3"  (1.6 m)   Wt 63.5 kg   BMI 24.80 kg/m  Physical Exam Vitals and nursing note reviewed.  Constitutional:      General: She is not in acute distress.    Appearance: She is not toxic-appearing.  HENT:     Head: Normocephalic and atraumatic.     Mouth/Throat:     Mouth: Lacerations present.     Comments: 1cm x1cm  laceration to lower mouth - does not go all the way through, bleeding controlled Eyes:     General: No scleral icterus.    Extraocular Movements: Extraocular movements intact.     Conjunctiva/sclera: Conjunctivae normal.     Pupils: Pupils are equal, round, and reactive to light.  Cardiovascular:     Rate and Rhythm: Normal rate and regular rhythm.     Pulses: Normal pulses.     Heart sounds: Normal heart sounds.  Pulmonary:     Effort: Pulmonary effort is normal. No respiratory distress.     Breath sounds: Normal breath sounds.  Abdominal:     General: Abdomen is flat. Bowel sounds are normal.     Palpations: Abdomen is soft.     Tenderness: There is no abdominal tenderness.  Musculoskeletal:     Cervical back: Normal range of motion. Tenderness present. No rigidity.     Comments: Bruising along bridge of noise and to chin, TT, swelling over area.. No deformity or step off.   Skin:    General: Skin is warm and dry.     Findings: No lesion.  Neurological:     General: No focal deficit present.     Mental Status: She is alert and oriented to person, place, and time. Mental status is at baseline.     Cranial Nerves: No cranial nerve deficit.     Sensory: No sensory deficit.     Motor: No weakness.     Gait: Gait normal.     ED Results / Procedures / Treatments   Labs (all labs ordered are listed, but only abnormal results are displayed) Labs Reviewed - No data to display  EKG None  Radiology No results found.  Procedures .Marland KitchenLaceration Repair  Date/Time: 11/04/2022 4:45 PM  Performed by: Smitty Knudsen, PA-C Authorized by: Smitty Knudsen, PA-C   Consent:    Consent obtained:  Verbal   Consent given by:  Patient   Risks, benefits, and alternatives were discussed: yes     Risks discussed:  Infection, need for additional repair, nerve damage, poor wound healing, poor cosmetic result, pain, retained foreign body, tendon damage and vascular damage   Alternatives  discussed:  No treatment Universal protocol:    Procedure explained and questions answered to patient or proxy's satisfaction: yes     Relevant documents present and verified: yes     Test results available: yes     Imaging studies available: yes     Immediately prior to procedure, a time out was called: yes     Patient identity confirmed:  Verbally with patient Anesthesia:    Anesthesia method:  Topical application Laceration details:    Location:  Lip   Lip location:  Lower interior lip   Length (cm):  1   Depth (mm):  5 Pre-procedure details:    Preparation:  Patient was prepped and draped in usual sterile fashion Exploration:    Hemostasis achieved with:  Epinephrine Treatment:    Area cleansed with:  Chlorhexidine   Amount of cleaning:  Standard   Irrigation method:  Pressure wash   Debridement:  None Skin repair:    Repair method:  Sutures   Suture size:  5-0   Suture material:  Fast-absorbing gut   Number of sutures:  3 Approximation:    Approximation:  Close Repair type:    Repair type:  Simple Post-procedure details:    Dressing:  Open (no dressing)     Medications Ordered in ED Medications - No data to display  ED Course/ Medical Decision Making/ A&P Clinical Course as of 11/04/22 1549  Mon Nov 04, 2022  1505 Mechanical fall, intra-oral lac [JD]  1531 Radiology: trace SAH, otherwise negative [JD]    Clinical Course User Index [JD] Laurence Spates, MD                                 Medical Decision Making Amount and/or Complexity of Data Reviewed Labs: ordered. Radiology: ordered.  Risk Prescription drug management.   This patient presents to the ED for concern of mechanical fall, this involves an extensive number of treatment options, and is a complaint that carries with it a high risk of complications and morbidity.  The differential diagnosis includes laceration, HA, fracture, brain bleeding    Co morbidities that complicate the patient  evaluation  Pretension, hypothyroid, stroke, aortic stenosis, GERD, diabetes diet controlled   Additional history obtained:  Additional history obtained from husband at bedside   Lab Tests:  I Ordered, and personally interpreted labs.  The pertinent results include:   Cbc wnl Cmp wnl   Imaging Studies ordered:  I ordered imaging studies including head, cervical spine, maxi facial   I independently visualized and interpreted imaging which showed no acute facial fracture, no acute cervical fracture, trace acute subarachnoid hemorrhage I agree with the radiologist interpretation   Cardiac Monitoring: / EKG:  Vitals stable, slightly HTN   Consultations Obtained:  I requested consultation with the neurosurgery,  and discussed lab and imaging findings as well as pertinent plan - they recommend: Discontinue the aspirin for approximately 1 week, then okay to restart.  Follow-up with Dr. Jordan Likes , pts current surgeon, for todays visit.  Neurosurgery did not recommend a period of observation or reimaging.  Problem List / ED Course / Critical interventions / Medication management  Patient presenting to the emergency room at a fall that occurred around 11 AM today.  Patient reports mechanical fall falling directly onto her face and possibly catching herself with her wrist. Pt had immediate frontal headache, sore neck, pain over nasal bridge.  Patient does not clearly cannot recall the event.  And states some confusion after the fall, unsure how long it lasted.  She does not report any current confusion.  No focal neurological sx.  I ordered medication including tdap, local for lac repair  Reevaluation of the patient after these medicines showed that the patient stayed the same I have reviewed the patients home medicines and have made adjustments as needed   Plan Follow up outpatient w/ Dr Jordan Likes, as recommended by neurosurgery, already has appointment for November 7.  If symptoms continue  call to schedule sooner appointment. F/u  w/ pcp for todays findings.  Stop the aspirin for 1 week then okay to resume. Patient stable for discharge. Agreed to plan.  Patient educated on signs and symptoms of concern that would warrant return for worsening brain bleed.        Final Clinical Impression(s) / ED Diagnoses Final diagnoses:  None    Rx / DC Orders ED Discharge Orders     None         Smitty Knudsen, PA-C 11/04/22 1719    Laurence Spates, MD 11/05/22 1454

## 2022-11-07 DIAGNOSIS — M542 Cervicalgia: Secondary | ICD-10-CM | POA: Diagnosis not present

## 2022-11-08 ENCOUNTER — Ambulatory Visit (HOSPITAL_COMMUNITY): Payer: Medicare PPO | Attending: Cardiovascular Disease | Admitting: Cardiology

## 2022-11-08 ENCOUNTER — Other Ambulatory Visit (HOSPITAL_COMMUNITY): Payer: Medicare PPO

## 2022-11-08 ENCOUNTER — Ambulatory Visit (HOSPITAL_BASED_OUTPATIENT_CLINIC_OR_DEPARTMENT_OTHER): Payer: Medicare PPO

## 2022-11-08 VITALS — BP 100/52 | HR 57 | Ht 63.0 in | Wt 133.4 lb

## 2022-11-08 DIAGNOSIS — G8929 Other chronic pain: Secondary | ICD-10-CM | POA: Insufficient documentation

## 2022-11-08 DIAGNOSIS — I350 Nonrheumatic aortic (valve) stenosis: Secondary | ICD-10-CM | POA: Insufficient documentation

## 2022-11-08 DIAGNOSIS — Z952 Presence of prosthetic heart valve: Secondary | ICD-10-CM | POA: Insufficient documentation

## 2022-11-08 DIAGNOSIS — M549 Dorsalgia, unspecified: Secondary | ICD-10-CM | POA: Diagnosis not present

## 2022-11-08 DIAGNOSIS — I10 Essential (primary) hypertension: Secondary | ICD-10-CM | POA: Insufficient documentation

## 2022-11-08 DIAGNOSIS — I609 Nontraumatic subarachnoid hemorrhage, unspecified: Secondary | ICD-10-CM | POA: Diagnosis not present

## 2022-11-08 DIAGNOSIS — W108XXA Fall (on) (from) other stairs and steps, initial encounter: Secondary | ICD-10-CM | POA: Diagnosis not present

## 2022-11-08 LAB — ECHOCARDIOGRAM COMPLETE
AR max vel: 1.34 cm2
AV Area VTI: 1.38 cm2
AV Area mean vel: 1.35 cm2
AV Mean grad: 19 mmHg
AV Peak grad: 34.2 mmHg
Ao pk vel: 2.93 m/s
Area-P 1/2: 1.68 cm2
Est EF: >75
S' Lateral: 2.5 cm

## 2022-11-08 NOTE — Patient Instructions (Signed)
Medication Instructions:  Your physician recommends that you continue on your current medications as directed. Please refer to the Current Medication list given to you today.  *If you need a refill on your cardiac medications before your next appointment, please call your pharmacy*   Lab Work: None orderd If you have labs (blood work) drawn today and your tests are completely normal, you will receive your results only by: MyChart Message (if you have MyChart) OR A paper copy in the mail If you have any lab test that is abnormal or we need to change your treatment, we will call you to review the results.   Testing/Procedures: None ordered   Follow-Up: At [facility], you and your health needs are our priority.  As part of our continuing mission to provide you with exceptional heart care, we have created designated Provider Care Teams.  These Care Teams include your primary Cardiologist (physician) and Advanced Practice Providers (APPs -  Physician Assistants and Nurse Practitioners) who all work together to provide you with the care you need, when you need it.  We recommend signing up for the patient portal called "MyChart".  Sign up information is provided on this After Visit Summary.  MyChart is used to connect with patients for Virtual Visits (Telemedicine).  Patients are able to view lab/test results, encounter notes, upcoming appointments, etc.  Non-urgent messages can be sent to your provider as well.   To learn more about what you can do with MyChart, go to [email or web address].    Your next appointment:   As scheduled   Provider:   Reatha Harps, MD     Other Instructions

## 2022-11-08 NOTE — Progress Notes (Signed)
[facility]   MULTIDISCIPLINARY HEART VALVE TEAM  Structural Heart Office Note:  .   Date:  11/08/2022  ID:  CASSONDRA Cooper, DOB 1940-07-24, MRN [id number] PCP: Mliss Sax, MD  [facility] Providers Cardiologist:  Reatha Harps, MD     History of Present Illness: .    Cheryl Cooper is a 82 y.o. female a hx of HTN, HLD, GERD, hypothyroidism, diet controlled DMT2 and severe AS s/p TAVR (11/13/21) who presents for one year follow up.     Cheryl Cooper has been followed for aortic stenosis by Dr. Flora Lipps. Echo 10/08/21 showed EF 70%, with very severe AS with a mean grad 69 mmHg, peak grad 93.7 mmHg, AVA 0.46 cm2, DVI 0.18. She reported progressive symptoms of exertional dyspnea as well as fatigue and intermittent dizziness. [facility] 10/24/21 showed patent coronary arteries (right dominant) with a moderate ostial diagonal stenosis and no other significant coronary artery disease. Normal right heart pressures and cardiac output. She was then evaluated by the multidisciplinary valve team and underwent successful TAVR with a 23 mm Edwards Sapien 3 Ultra Resilia THV via the TF approach on 11/13/21. Post operative echo EF 70%, normally functioning TAVR with a mean gradient of 11 mmHg and trivial PVL. She was started on ASA 81mg  daily.   She has really done well from a CV standpoint. In June she underwent back surgery with Dr. Jordan Likes and has also done well with this. Today she is here with her husband. She comes wearing a neck brace with ecchymotic jaw and nose. She says that she was walking at the front of her house and tripped on her cement step. Unfortunately she fell directly on her face and suffering a trace acute subarachnoid hemorrhage on CT imaging. There was question about admission and Dr. Jordan Likes was contacted. No further testing was recommended and plan was for OP follow up with him. She denies chest pain, SOB, palpitations, LE edema, orthopnea, PND, dizziness, or syncope.  Denies bleeding in stool or urine.     Studies Reviewed: .   Cardiac Studies & Procedures   CARDIAC CATHETERIZATION  CARDIAC CATHETERIZATION 10/24/2021  Narrative   1st Diag lesion is 60% stenosed.  1.  Patent coronary arteries (right dominant) with a moderate ostial diagonal stenosis and no other significant coronary artery disease 2.  Normal right heart pressures and cardiac output 3.  Severe calcification and restriction of the aortic valve leaflets seen on plain fluoroscopy with known severe aortic stenosis.  Severe calcification also identified in the aortic root  Recommendations: Continue evaluation for TAVR.  Patient will undergo CTA studies and formal cardiac surgical consultation as part of a multidisciplinary approach to her care.  Findings Coronary Findings Diagnostic  Dominance: Right  Left Main Vessel is angiographically normal.  Left Anterior Descending The vessel exhibits minimal luminal irregularities. The LAD is patent throughout its course with a moderate ostial first diagonal lesion and no other significant disease identified  First Diagonal Branch 1st Diag lesion is 60% stenosed. The first diagonal branch has moderate 60 to 70% ostial stenosis.  Left Circumflex The circumflex and OM branches are patent with no significant stenosis  Right Coronary Artery There is mild diffuse disease throughout the vessel. The RCA is dominant with mild luminal irregularity but no significant stenosis.  There is calcification around the right coronary cusp but no ostial disease is identified.  The vessel supplies a PDA and PLA branch without significant stenosis.  Intervention  [facility]   MULTIDISCIPLINARY HEART VALVE TEAM  Structural Heart Office Note:  .   Date:  11/08/2022  ID:  CASSONDRA Cooper, DOB 1940-07-24, MRN [id number] PCP: Mliss Sax, MD  [facility] Providers Cardiologist:  Reatha Harps, MD     History of Present Illness: .    Cheryl Cooper is a 82 y.o. female a hx of HTN, HLD, GERD, hypothyroidism, diet controlled DMT2 and severe AS s/p TAVR (11/13/21) who presents for one year follow up.     Cheryl Cooper has been followed for aortic stenosis by Dr. Flora Lipps. Echo 10/08/21 showed EF 70%, with very severe AS with a mean grad 69 mmHg, peak grad 93.7 mmHg, AVA 0.46 cm2, DVI 0.18. She reported progressive symptoms of exertional dyspnea as well as fatigue and intermittent dizziness. [facility] 10/24/21 showed patent coronary arteries (right dominant) with a moderate ostial diagonal stenosis and no other significant coronary artery disease. Normal right heart pressures and cardiac output. She was then evaluated by the multidisciplinary valve team and underwent successful TAVR with a 23 mm Edwards Sapien 3 Ultra Resilia THV via the TF approach on 11/13/21. Post operative echo EF 70%, normally functioning TAVR with a mean gradient of 11 mmHg and trivial PVL. She was started on ASA 81mg  daily.   She has really done well from a CV standpoint. In June she underwent back surgery with Dr. Jordan Likes and has also done well with this. Today she is here with her husband. She comes wearing a neck brace with ecchymotic jaw and nose. She says that she was walking at the front of her house and tripped on her cement step. Unfortunately she fell directly on her face and suffering a trace acute subarachnoid hemorrhage on CT imaging. There was question about admission and Dr. Jordan Likes was contacted. No further testing was recommended and plan was for OP follow up with him. She denies chest pain, SOB, palpitations, LE edema, orthopnea, PND, dizziness, or syncope.  Denies bleeding in stool or urine.     Studies Reviewed: .   Cardiac Studies & Procedures   CARDIAC CATHETERIZATION  CARDIAC CATHETERIZATION 10/24/2021  Narrative   1st Diag lesion is 60% stenosed.  1.  Patent coronary arteries (right dominant) with a moderate ostial diagonal stenosis and no other significant coronary artery disease 2.  Normal right heart pressures and cardiac output 3.  Severe calcification and restriction of the aortic valve leaflets seen on plain fluoroscopy with known severe aortic stenosis.  Severe calcification also identified in the aortic root  Recommendations: Continue evaluation for TAVR.  Patient will undergo CTA studies and formal cardiac surgical consultation as part of a multidisciplinary approach to her care.  Findings Coronary Findings Diagnostic  Dominance: Right  Left Main Vessel is angiographically normal.  Left Anterior Descending The vessel exhibits minimal luminal irregularities. The LAD is patent throughout its course with a moderate ostial first diagonal lesion and no other significant disease identified  First Diagonal Branch 1st Diag lesion is 60% stenosed. The first diagonal branch has moderate 60 to 70% ostial stenosis.  Left Circumflex The circumflex and OM branches are patent with no significant stenosis  Right Coronary Artery There is mild diffuse disease throughout the vessel. The RCA is dominant with mild luminal irregularity but no significant stenosis.  There is calcification around the right coronary cusp but no ostial disease is identified.  The vessel supplies a PDA and PLA branch without significant stenosis.  Intervention  No interventions have been documented.     ECHOCARDIOGRAM  ECHOCARDIOGRAM COMPLETE 12/12/2021  Narrative ECHOCARDIOGRAM REPORT    Patient Name:   STEPHNEY RUZICH Date of Exam: 12/12/2021 Medical Rec #:  [id number]       Height:       63.0 in Accession #:    [id number]      Weight:       140.0  lb Date of Birth:  21-Dec-1940        BSA:          1.662 m Patient Age:    81 years        BP:           116/72 mmHg Patient Gender: F               HR:           61 bpm. Exam Location:  [address]  Procedure: 2D Echo, Cardiac Doppler and Color Doppler  Indications:    Z95.2 Post TAVR evaluation  History:        Patient has prior history of Echocardiogram examinations, most recent 11/14/2021. Stroke; Risk Factors:Hypertension, Diabetes and Dyslipidemia. Severe aortic stenosis. Chronic back pain. Anemia.  Sonographer:    Cathie Beams RCS Referring Phys: [id number] KATHRYN R THOMPSON  IMPRESSIONS   1. The aortic valve has been replaced by a 23 mm Sapien Valve. Aortic valve regurgitation is trivial (PVL at the 12 o'clock position). Aortic valve mean gradient measures 14.0 mmHg. DVI 0.41. 2. Left ventricular ejection fraction, by estimation, is 70 to 75%. The left ventricle has hyperdynamic function. The left ventricle has no regional wall motion abnormalities. There is moderate asymmetric left ventricular hypertrophy of the septal segment. Left ventricular diastolic parameters are consistent with Grade I diastolic dysfunction (impaired relaxation).There is an increase intracavitary gradient that increased with Valsalva without an LVOT gradient. 3. Right ventricular systolic function is normal. The right ventricular size is normal. There is normal pulmonary artery systolic pressure. 4. The mitral valve is degenerative. No evidence of mitral valve regurgitation.  Comparison(s): Prior images reviewed side by side. Stable prosthetic parameters.  FINDINGS Left Ventricle: Left ventricular ejection fraction, by estimation, is 70 to 75%. The left ventricle has hyperdynamic function. The left ventricle has no regional wall motion abnormalities. The left ventricular internal cavity size was normal in size. There is moderate asymmetric left ventricular hypertrophy of the septal segment. Left  ventricular diastolic parameters are consistent with Grade I diastolic dysfunction (impaired relaxation).  Right Ventricle: The right ventricular size is normal. No increase in right ventricular wall thickness. Right ventricular systolic function is normal. There is normal pulmonary artery systolic pressure. The tricuspid regurgitant velocity is 2.33 m/s, and with an assumed right atrial pressure of 3 mmHg, the estimated right ventricular systolic pressure is 24.7 mmHg.  Left Atrium: Left atrial size was normal in size.  Right Atrium: Right atrial size was normal in size.  Pericardium: There is no evidence of pericardial effusion.  Mitral Valve: The mitral valve is degenerative in appearance. No evidence of mitral valve regurgitation.  Tricuspid Valve: The tricuspid valve is normal in structure. Tricuspid valve regurgitation is not demonstrated. No evidence of tricuspid stenosis.  Aortic Valve: The aortic valve has been repaired/replaced. Aortic valve regurgitation is trivial. Aortic valve mean gradient measures 14.0 mmHg. Aortic valve peak gradient measures 33.6 mmHg. Aortic valve area, by VTI measures 1.65 cm.  Pulmonic Valve: The pulmonic valve was normal in structure. Pulmonic valve regurgitation  [facility]   MULTIDISCIPLINARY HEART VALVE TEAM  Structural Heart Office Note:  .   Date:  11/08/2022  ID:  CASSONDRA Cooper, DOB 1940-07-24, MRN [id number] PCP: Mliss Sax, MD  [facility] Providers Cardiologist:  Reatha Harps, MD     History of Present Illness: .    Cheryl Cooper is a 82 y.o. female a hx of HTN, HLD, GERD, hypothyroidism, diet controlled DMT2 and severe AS s/p TAVR (11/13/21) who presents for one year follow up.     Cheryl Cooper has been followed for aortic stenosis by Dr. Flora Lipps. Echo 10/08/21 showed EF 70%, with very severe AS with a mean grad 69 mmHg, peak grad 93.7 mmHg, AVA 0.46 cm2, DVI 0.18. She reported progressive symptoms of exertional dyspnea as well as fatigue and intermittent dizziness. [facility] 10/24/21 showed patent coronary arteries (right dominant) with a moderate ostial diagonal stenosis and no other significant coronary artery disease. Normal right heart pressures and cardiac output. She was then evaluated by the multidisciplinary valve team and underwent successful TAVR with a 23 mm Edwards Sapien 3 Ultra Resilia THV via the TF approach on 11/13/21. Post operative echo EF 70%, normally functioning TAVR with a mean gradient of 11 mmHg and trivial PVL. She was started on ASA 81mg  daily.   She has really done well from a CV standpoint. In June she underwent back surgery with Dr. Jordan Likes and has also done well with this. Today she is here with her husband. She comes wearing a neck brace with ecchymotic jaw and nose. She says that she was walking at the front of her house and tripped on her cement step. Unfortunately she fell directly on her face and suffering a trace acute subarachnoid hemorrhage on CT imaging. There was question about admission and Dr. Jordan Likes was contacted. No further testing was recommended and plan was for OP follow up with him. She denies chest pain, SOB, palpitations, LE edema, orthopnea, PND, dizziness, or syncope.  Denies bleeding in stool or urine.     Studies Reviewed: .   Cardiac Studies & Procedures   CARDIAC CATHETERIZATION  CARDIAC CATHETERIZATION 10/24/2021  Narrative   1st Diag lesion is 60% stenosed.  1.  Patent coronary arteries (right dominant) with a moderate ostial diagonal stenosis and no other significant coronary artery disease 2.  Normal right heart pressures and cardiac output 3.  Severe calcification and restriction of the aortic valve leaflets seen on plain fluoroscopy with known severe aortic stenosis.  Severe calcification also identified in the aortic root  Recommendations: Continue evaluation for TAVR.  Patient will undergo CTA studies and formal cardiac surgical consultation as part of a multidisciplinary approach to her care.  Findings Coronary Findings Diagnostic  Dominance: Right  Left Main Vessel is angiographically normal.  Left Anterior Descending The vessel exhibits minimal luminal irregularities. The LAD is patent throughout its course with a moderate ostial first diagonal lesion and no other significant disease identified  First Diagonal Branch 1st Diag lesion is 60% stenosed. The first diagonal branch has moderate 60 to 70% ostial stenosis.  Left Circumflex The circumflex and OM branches are patent with no significant stenosis  Right Coronary Artery There is mild diffuse disease throughout the vessel. The RCA is dominant with mild luminal irregularity but no significant stenosis.  There is calcification around the right coronary cusp but no ostial disease is identified.  The vessel supplies a PDA and PLA branch without significant stenosis.  Intervention  No interventions have been documented.     ECHOCARDIOGRAM  ECHOCARDIOGRAM COMPLETE 12/12/2021  Narrative ECHOCARDIOGRAM REPORT    Patient Name:   STEPHNEY RUZICH Date of Exam: 12/12/2021 Medical Rec #:  [id number]       Height:       63.0 in Accession #:    [id number]      Weight:       140.0  lb Date of Birth:  21-Dec-1940        BSA:          1.662 m Patient Age:    81 years        BP:           116/72 mmHg Patient Gender: F               HR:           61 bpm. Exam Location:  [address]  Procedure: 2D Echo, Cardiac Doppler and Color Doppler  Indications:    Z95.2 Post TAVR evaluation  History:        Patient has prior history of Echocardiogram examinations, most recent 11/14/2021. Stroke; Risk Factors:Hypertension, Diabetes and Dyslipidemia. Severe aortic stenosis. Chronic back pain. Anemia.  Sonographer:    Cathie Beams RCS Referring Phys: [id number] KATHRYN R THOMPSON  IMPRESSIONS   1. The aortic valve has been replaced by a 23 mm Sapien Valve. Aortic valve regurgitation is trivial (PVL at the 12 o'clock position). Aortic valve mean gradient measures 14.0 mmHg. DVI 0.41. 2. Left ventricular ejection fraction, by estimation, is 70 to 75%. The left ventricle has hyperdynamic function. The left ventricle has no regional wall motion abnormalities. There is moderate asymmetric left ventricular hypertrophy of the septal segment. Left ventricular diastolic parameters are consistent with Grade I diastolic dysfunction (impaired relaxation).There is an increase intracavitary gradient that increased with Valsalva without an LVOT gradient. 3. Right ventricular systolic function is normal. The right ventricular size is normal. There is normal pulmonary artery systolic pressure. 4. The mitral valve is degenerative. No evidence of mitral valve regurgitation.  Comparison(s): Prior images reviewed side by side. Stable prosthetic parameters.  FINDINGS Left Ventricle: Left ventricular ejection fraction, by estimation, is 70 to 75%. The left ventricle has hyperdynamic function. The left ventricle has no regional wall motion abnormalities. The left ventricular internal cavity size was normal in size. There is moderate asymmetric left ventricular hypertrophy of the septal segment. Left  ventricular diastolic parameters are consistent with Grade I diastolic dysfunction (impaired relaxation).  Right Ventricle: The right ventricular size is normal. No increase in right ventricular wall thickness. Right ventricular systolic function is normal. There is normal pulmonary artery systolic pressure. The tricuspid regurgitant velocity is 2.33 m/s, and with an assumed right atrial pressure of 3 mmHg, the estimated right ventricular systolic pressure is 24.7 mmHg.  Left Atrium: Left atrial size was normal in size.  Right Atrium: Right atrial size was normal in size.  Pericardium: There is no evidence of pericardial effusion.  Mitral Valve: The mitral valve is degenerative in appearance. No evidence of mitral valve regurgitation.  Tricuspid Valve: The tricuspid valve is normal in structure. Tricuspid valve regurgitation is not demonstrated. No evidence of tricuspid stenosis.  Aortic Valve: The aortic valve has been repaired/replaced. Aortic valve regurgitation is trivial. Aortic valve mean gradient measures 14.0 mmHg. Aortic valve peak gradient measures 33.6 mmHg. Aortic valve area, by VTI measures 1.65 cm.  Pulmonic Valve: The pulmonic valve was normal in structure. Pulmonic valve regurgitation  No interventions have been documented.     ECHOCARDIOGRAM  ECHOCARDIOGRAM COMPLETE 12/12/2021  Narrative ECHOCARDIOGRAM REPORT    Patient Name:   STEPHNEY RUZICH Date of Exam: 12/12/2021 Medical Rec #:  [id number]       Height:       63.0 in Accession #:    [id number]      Weight:       140.0  lb Date of Birth:  21-Dec-1940        BSA:          1.662 m Patient Age:    81 years        BP:           116/72 mmHg Patient Gender: F               HR:           61 bpm. Exam Location:  [address]  Procedure: 2D Echo, Cardiac Doppler and Color Doppler  Indications:    Z95.2 Post TAVR evaluation  History:        Patient has prior history of Echocardiogram examinations, most recent 11/14/2021. Stroke; Risk Factors:Hypertension, Diabetes and Dyslipidemia. Severe aortic stenosis. Chronic back pain. Anemia.  Sonographer:    Cathie Beams RCS Referring Phys: [id number] KATHRYN R THOMPSON  IMPRESSIONS   1. The aortic valve has been replaced by a 23 mm Sapien Valve. Aortic valve regurgitation is trivial (PVL at the 12 o'clock position). Aortic valve mean gradient measures 14.0 mmHg. DVI 0.41. 2. Left ventricular ejection fraction, by estimation, is 70 to 75%. The left ventricle has hyperdynamic function. The left ventricle has no regional wall motion abnormalities. There is moderate asymmetric left ventricular hypertrophy of the septal segment. Left ventricular diastolic parameters are consistent with Grade I diastolic dysfunction (impaired relaxation).There is an increase intracavitary gradient that increased with Valsalva without an LVOT gradient. 3. Right ventricular systolic function is normal. The right ventricular size is normal. There is normal pulmonary artery systolic pressure. 4. The mitral valve is degenerative. No evidence of mitral valve regurgitation.  Comparison(s): Prior images reviewed side by side. Stable prosthetic parameters.  FINDINGS Left Ventricle: Left ventricular ejection fraction, by estimation, is 70 to 75%. The left ventricle has hyperdynamic function. The left ventricle has no regional wall motion abnormalities. The left ventricular internal cavity size was normal in size. There is moderate asymmetric left ventricular hypertrophy of the septal segment. Left  ventricular diastolic parameters are consistent with Grade I diastolic dysfunction (impaired relaxation).  Right Ventricle: The right ventricular size is normal. No increase in right ventricular wall thickness. Right ventricular systolic function is normal. There is normal pulmonary artery systolic pressure. The tricuspid regurgitant velocity is 2.33 m/s, and with an assumed right atrial pressure of 3 mmHg, the estimated right ventricular systolic pressure is 24.7 mmHg.  Left Atrium: Left atrial size was normal in size.  Right Atrium: Right atrial size was normal in size.  Pericardium: There is no evidence of pericardial effusion.  Mitral Valve: The mitral valve is degenerative in appearance. No evidence of mitral valve regurgitation.  Tricuspid Valve: The tricuspid valve is normal in structure. Tricuspid valve regurgitation is not demonstrated. No evidence of tricuspid stenosis.  Aortic Valve: The aortic valve has been repaired/replaced. Aortic valve regurgitation is trivial. Aortic valve mean gradient measures 14.0 mmHg. Aortic valve peak gradient measures 33.6 mmHg. Aortic valve area, by VTI measures 1.65 cm.  Pulmonic Valve: The pulmonic valve was normal in structure. Pulmonic valve regurgitation

## 2022-11-11 ENCOUNTER — Other Ambulatory Visit: Payer: Self-pay | Admitting: Family Medicine

## 2022-11-11 NOTE — Therapy (Unsigned)
OUTPATIENT PHYSICAL THERAPY THORACOLUMBAR EVALUATION   Patient Name: Cheryl Cooper MRN: [id number] DOB:12-11-1940, 82 y.o., female Today's Date: 11/12/2022  END OF SESSION:  PT End of Session - 11/12/22 1220     Visit Number 1    Date for PT Re-Evaluation 01/21/23    PT Start Time 1020    PT Stop Time 1050    PT Time Calculation (min) 30 min    Activity Tolerance Patient tolerated treatment well    Behavior During Therapy WFL for tasks assessed/performed             Past Medical History:  Diagnosis Date   Anxiety    Arthritis    Carpal tunnel syndrome    Deafness in left ear    Depression    Diabetes mellitus    diet controlled   Diverticulitis    GERD (gastroesophageal reflux disease)    Hypercholesteremia    Hypertension    Hypothyroid    Memory loss    reports d/t concussion   PONV (postoperative nausea and vomiting) yrs ago, none recent   Post concussion syndrome    S/P TAVR (transcatheter aortic valve replacement) 11/13/2021   s/p TAVR with a 23 mm Edwards S3UR via the TF approach by Dr. Excell Seltzer and Dr. Leafy Ro   Severe aortic stenosis    Stroke [facility])    Past Surgical History:  Procedure Laterality Date   ABDOMINAL HYSTERECTOMY  1986   ANTERIOR LATERAL LUMBAR FUSION WITH PERCUTANEOUS SCREW 1 LEVEL Left 01/25/2022   Procedure: ANTERIOR LATERAL LUMBAR FUSION WITH PERCUTANEOUS SCREW LUMBAR THREE-FOUR;  Surgeon: Julio Sicks, MD;  Location: [facility] OR;  Service: Neurosurgery;  Laterality: Left;   BACK SURGERY  2010   lower   BIOPSY  01/12/2018   Procedure: BIOPSY;  Surgeon: Meridee Score Netty Starring., MD;  Location: [facility] ENDOSCOPY;  Service: Gastroenterology;;   CARDIAC CATHETERIZATION     CARPAL TUNNEL RELEASE Right 2023   CHOLECYSTECTOMY     ESOPHAGOGASTRODUODENOSCOPY (EGD) WITH PROPOFOL N/A 01/12/2018   Procedure: ESOPHAGOGASTRODUODENOSCOPY (EGD) WITH PROPOFOL;  Surgeon: Lemar Lofty., MD;  Location: [facility];  Service: Gastroenterology;  Laterality:  N/A;   EYE SURGERY Right    cataract   INTRAOPERATIVE TRANSTHORACIC ECHOCARDIOGRAM N/A 11/13/2021   Procedure: INTRAOPERATIVE TRANSTHORACIC ECHOCARDIOGRAM;  Surgeon: Tonny Bollman, MD;  Location: [facility] OR;  Service: Open Heart Surgery;  Laterality: N/A;   left elobow surgery  2003   left rotator cuff  2001   LUMBAR LAMINECTOMY/DECOMPRESSION MICRODISCECTOMY Left 12/23/2017   Procedure: Laminectomy for facet/synovial cyst - left - Lumbar three-Lumbar four;  Surgeon: Julio Sicks, MD;  Location: [facility] OR;  Service: Neurosurgery;  Laterality: Left;   LUMBAR SPINE SURGERY  08/12/2022   r foot surgery  1995   REVERSE SHOULDER ARTHROPLASTY Left 10/23/2018   Procedure: REVERSE TOTAL SHOULDER ARTHROPLASTY;  Surgeon: Beverely Low, MD;  Location: [facility] ORS;  Service: Orthopedics;  Laterality: Left;  interscalene block   REVERSE SHOULDER ARTHROPLASTY Right 04/20/2021   Procedure: REVERSE SHOULDER ARTHROPLASTY;  Surgeon: Beverely Low, MD;  Location: [facility] ORS;  Service: Orthopedics;  Laterality: Right;  with ISB   right hand surgery  2001   RIGHT HEART CATH AND CORONARY ANGIOGRAPHY N/A 10/24/2021   Procedure: RIGHT HEART CATH AND CORONARY ANGIOGRAPHY;  Surgeon: Tonny Bollman, MD;  Location: [facility] INVASIVE CV LAB;  Service: Cardiovascular;  Laterality: N/A;   right index finger surgery  2009   SHOULDER OPEN ROTATOR CUFF REPAIR  11/21/2011   Procedure: ROTATOR CUFF REPAIR SHOULDER  OPEN;  Surgeon: Javier Docker, MD;  Location: [facility] ORS;  Service: Orthopedics;  Laterality: Right;  with subacromial decompression   SHOULDER OPEN ROTATOR CUFF REPAIR Right 05/09/2016   Procedure: Right shoulder mini open revision rotator cuff repair, subacromial decompression;  Surgeon: Jene Every, MD;  Location: [facility] ORS;  Service: Orthopedics;  Laterality: Right;   TOTAL HIP ARTHROPLASTY Right 04/24/2017   Procedure: RIGHT TOTAL HIP ARTHROPLASTY ANTERIOR APPROACH;  Surgeon: Samson Frederic, MD;  Location: [facility] ORS;  Service: Orthopedics;   Laterality: Right;  Needs RNFA   TRANSCATHETER AORTIC VALVE REPLACEMENT, TRANSFEMORAL N/A 11/13/2021   Procedure: Transcatheter Aortic Valve Replacement, Transfemoral Edwards 23 MM SAPIEN 3 Ultra;  Surgeon: Tonny Bollman, MD;  Location: [facility] OR;  Service: Open Heart Surgery;  Laterality: N/A;  Transfemoral approach   Patient Active Problem List   Diagnosis Date Noted   Lumbar adjacent segment disease with spondylolisthesis 08/13/2022   Degenerative spondylolisthesis 01/25/2022   S/P TAVR (transcatheter aortic valve replacement) 11/13/2021   Severe aortic stenosis    Urine frequency 07/26/2021   Depression 06/25/2021   S/P shoulder replacement, right 04/20/2021   Slow transit constipation 03/20/2021   Anemia 03/20/2021   Tick bite of abdomen 07/11/2020   Hyperlipidemia 06/22/2020   Type 2 diabetes, diet controlled (HCC) 06/22/2020   Chronic back pain 06/22/2020   Transaminitis 06/22/2020   PUD (peptic ulcer disease) 03/21/2018   Synovial cyst of lumbar facet joint 12/23/2017   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   Rotator cuff tear, right 11/21/2011    PCP: Mliss Sax, MD  REFERRING PROVIDER: Julio Sicks, MD   REFERRING DIAG: M43.10 (ICD-10-CM) - Spondylolisthesis, site unspecified   Rationale for Evaluation and Treatment: Rehabilitation  THERAPY DIAG:  Abnormal posture  Difficulty in walking, not elsewhere classified  Muscle weakness (generalized)  Unsteadiness on feet  Acquired spondylolisthesis  ONSET DATE: 10/25/22  SUBJECTIVE:                                                                                                                                                                                           SUBJECTIVE STATEMENT: Patient reports that she had lumbar surgery 07/25/22. Her Dr has cleared her for therapy. She has since had a fall, landing face first on her outside step on 11/04/22. She was cleared by Dr Jordan Likes to proceed with PT. She arrives  wearing a soft collar.  PERTINENT HISTORY:  TAVR 10/24/21, EF 70%, B Rot Cuff surgery, back surgery in 12/23 Per referring physician:  L3-4 decompression and fusion 07/25/22. Sustained a fall on 11/04/22  ER report: 82 year old female presenting for mechanical fall. She has some  facial bruising as well as small lip laceration on the lower lip. CT scans concerning for small subarachnoid hemorrhage. Discussed with neurosurgery who recommends no observation or repeat imaging, just outpatient follow-up. She remained stable here, able without difficulty. She will stop aspirin for a week and then follow-up with neurosurgery. Strict return precautions given. Discharged in stable condition.   PAIN:  Are you having pain? Yes: NPRS scale: 6/10 Pain location: low back Pain description: aching, stays in the back Aggravating factors: First thing in the morning, accumulated mobility towards the afternoon Relieving factors: Pain meds, lying down  PRECAUTIONS: Back and Fall  RED FLAGS: None   WEIGHT BEARING RESTRICTIONS: No  FALLS:  Has patient fallen in last 6 months? Yes. Number of falls 2 and a couple near falls.  LIVING ENVIRONMENT: Lives with: lives with their family and lives with their spouse Lives in: House/apartment Stairs: Yes: External: 2 steps; on right going up Has following equipment at home: Retail banker - 4 wheeled, grab bars in shower  OCCUPATION: N/A  PLOF: Independent  PATIENT GOALS: Patient would like to return to her PLOF, walk without an AD.  NEXT MD VISIT: Pool-12/26/22  OBJECTIVE:   DIAGNOSTIC FINDINGS:  CT cervical spine 11/04/22 IMPRESSION: 1. Trace acute subarachnoid hemorrhage along the bilateral anterior cingulate sulci. 2. No acute facial fracture. 3. No acute cervical spine fracture or traumatic malalignment  COGNITION: Overall cognitive status: Within functional limits for tasks assessed     SENSATION: WFL  MUSCLE  LENGTH: Hamstrings: Right 65 deg; Left 60 deg Thomas test: WNL B  POSTURE: rounded shoulders, forward head, increased thoracic kyphosis, and pelvis rotated post on L  PALPATION: TTP lower lumbar paraspinals on R, R gluts, piriformis and prox ITB  LUMBAR ROM:   AROM eval  Flexion Mid shin, pain in L leg  Extension deferred  Right lateral flexion knee  Left lateral flexion knee  Right rotation deferred  Left rotation deferred   (Blank rows = not tested)  LOWER EXTREMITY ROM:  B hips with decreased PROM in all planes, mild   LOWER EXTREMITY MMT:    MMT Right eval Left eval  Hip flexion 4 4  Hip extension 4- 3+  Hip abduction 4- 4-  Hip adduction    Hip internal rotation    Hip external rotation    Knee flexion 5 5  Knee extension 5 5  Ankle dorsiflexion    Ankle plantarflexion    Ankle inversion    Ankle eversion     (Blank rows = not tested)  LUMBAR SPECIAL TESTS:  Straight leg raise test: positive on R and Slump test: positive on L  FUNCTIONAL TESTS:  5 times sit to stand: 11.9, stable Timed up and go (TUG): 19.23, unsteady, especially during turns, no AD  GAIT: Distance walked: In clinic distances Assistive device utilized: None Level of assistance: Modified independence Comments: Patient was slow and unsteady during gait with multiple deviations including increased lateral sway, increased stance time B, shuffling. She reports that she uses a Rollator at home, but couldn't bring it. She has a cane in the car, but is unsure how to walk with it.  TODAY'S TREATMENT:  DATE:  11/12/22 Education   PATIENT EDUCATION:  Education details: POC Person educated: Patient Education method: Explanation Education comprehension: verbalized understanding  HOME EXERCISE PROGRAM: TBD  ASSESSMENT:  CLINICAL IMPRESSION: Patient is a 82 y.o. who  was seen today for physical therapy evaluation and treatment for F/U after L3-4 decompression and fusion 08/13/22. She reports a previous back surgery 12/23. She sustained a recent fall 11/04/22, with discovery of a small SAH. He Dr cleared her to progress with therapy after the fall. She arrived wearing a soft cervical collar with no instructions from the Dr on its use. She walked in without an AD, very unsteady. Reports that she uses a rollator at home and has a cane in the car, but is afraid that she may fall with it as she doesn't know how to use it. Encouraged her to bring it in on next visit for education. Patient demonstrates some weakness in her hips, some inconsistent responses to lumbar special tests, positive on R for SLR, L for slump test. She reports that her L leg was more symptomatic prior to surgeries. She will benefit from PT to address her stiffness and weakness in order to improve her trunk stability and pain as well as progress her balance and functional mobility to return to her PLOF, which was I with all self care and daily activities. Husband now performs much of the household activities. *Consulted Dr Jordan Likes for clarification on patient wearing soft collar. He responds that they will discuss it at her next Dr. Visit. Continue to wear as ordered for now. Will inform patient at next visit.  OBJECTIVE IMPAIRMENTS: Abnormal gait, decreased activity tolerance, decreased balance, decreased coordination, difficulty walking, decreased ROM, decreased strength, improper body mechanics, and pain.   ACTIVITY LIMITATIONS: carrying, lifting, bending, squatting, stairs, and locomotion level  PARTICIPATION LIMITATIONS: meal prep, cleaning, laundry, driving, shopping, and community activity  PERSONAL FACTORS: Past/current experiences are also affecting patient's functional outcome.   REHAB POTENTIAL: Good  CLINICAL DECISION MAKING: Evolving/moderate complexity  EVALUATION COMPLEXITY:  Moderate   GOALS: Goals reviewed with patient? Yes  SHORT TERM GOALS: Target date: 11/28/22  I with initial HEP Baseline: Goal status: INITIAL  LONG TERM GOALS: Target date: 01/21/23  I with final HEP Baseline:  Goal status: INITIAL  2.  TUG in < 12 sec Baseline: 19.23, unsteady, no AD Goal status: INITIAL  3.  Increase B hip strength to at least 4/5 Baseline:  Goal status: INITIAL  4.  Patient will ambulate at least 400' on level or slightly unlevel surfaces without AD, I, minimized gait deviations and no unsteadiness. Baseline: In clinic, max 100', unsteady, multiple gait deviations noted. Goal status: INITIAL  5.  Patient will report at least 50% improvement in her pain symptoms with daily activities. Baseline: 6/10 Goal status: INITIAL  PLAN:  PT FREQUENCY: 1-2x/week  PT DURATION: 10 weeks  PLANNED INTERVENTIONS: Therapeutic exercises, Therapeutic activity, Neuromuscular re-education, Balance training, Gait training, Patient/Family education, Self Care, Joint mobilization, Stair training, Dry Needling, Electrical stimulation, Cryotherapy, Moist heat, Ionotophoresis 4mg /ml Dexamethasone, and Manual therapy.  PLAN FOR NEXT SESSION: HEP   Iona Beard, DPT 11/12/2022, 12:26 PM

## 2022-11-12 ENCOUNTER — Ambulatory Visit: Payer: Medicare PPO | Attending: Neurosurgery | Admitting: Physical Therapy

## 2022-11-12 ENCOUNTER — Encounter: Payer: Self-pay | Admitting: Physical Therapy

## 2022-11-12 DIAGNOSIS — M4310 Spondylolisthesis, site unspecified: Secondary | ICD-10-CM | POA: Insufficient documentation

## 2022-11-12 DIAGNOSIS — R2681 Unsteadiness on feet: Secondary | ICD-10-CM | POA: Diagnosis not present

## 2022-11-12 DIAGNOSIS — R262 Difficulty in walking, not elsewhere classified: Secondary | ICD-10-CM | POA: Diagnosis not present

## 2022-11-12 DIAGNOSIS — R293 Abnormal posture: Secondary | ICD-10-CM | POA: Insufficient documentation

## 2022-11-12 DIAGNOSIS — M6281 Muscle weakness (generalized): Secondary | ICD-10-CM | POA: Diagnosis not present

## 2022-11-14 ENCOUNTER — Ambulatory Visit: Payer: Medicare PPO | Admitting: Physical Therapy

## 2022-11-14 DIAGNOSIS — M6281 Muscle weakness (generalized): Secondary | ICD-10-CM

## 2022-11-14 DIAGNOSIS — M4310 Spondylolisthesis, site unspecified: Secondary | ICD-10-CM | POA: Diagnosis not present

## 2022-11-14 DIAGNOSIS — R2681 Unsteadiness on feet: Secondary | ICD-10-CM | POA: Diagnosis not present

## 2022-11-14 DIAGNOSIS — R262 Difficulty in walking, not elsewhere classified: Secondary | ICD-10-CM | POA: Diagnosis not present

## 2022-11-14 DIAGNOSIS — R293 Abnormal posture: Secondary | ICD-10-CM | POA: Diagnosis not present

## 2022-11-14 NOTE — Therapy (Signed)
OUTPATIENT PHYSICAL THERAPY THORACOLUMBAR    Patient Name: Cheryl Cooper MRN: [id number] DOB:1940-06-08, 82 y.o., female Today's Date: 11/14/2022  END OF SESSION:  PT End of Session - 11/14/22 1404     Visit Number 2    Date for PT Re-Evaluation 01/21/23    PT Start Time 1402    PT Stop Time 1445    PT Time Calculation (min) 43 min             Past Medical History:  Diagnosis Date   Anxiety    Arthritis    Carpal tunnel syndrome    Deafness in left ear    Depression    Diabetes mellitus    diet controlled   Diverticulitis    GERD (gastroesophageal reflux disease)    Hypercholesteremia    Hypertension    Hypothyroid    Memory loss    reports d/t concussion   PONV (postoperative nausea and vomiting) yrs ago, none recent   Post concussion syndrome    S/P TAVR (transcatheter aortic valve replacement) 11/13/2021   s/p TAVR with a 23 mm Edwards S3UR via the TF approach by Dr. Excell Seltzer and Dr. Leafy Ro   Severe aortic stenosis    Stroke [facility])    Past Surgical History:  Procedure Laterality Date   ABDOMINAL HYSTERECTOMY  1986   ANTERIOR LATERAL LUMBAR FUSION WITH PERCUTANEOUS SCREW 1 LEVEL Left 01/25/2022   Procedure: ANTERIOR LATERAL LUMBAR FUSION WITH PERCUTANEOUS SCREW LUMBAR THREE-FOUR;  Surgeon: Julio Sicks, MD;  Location: [facility] OR;  Service: Neurosurgery;  Laterality: Left;   BACK SURGERY  2010   lower   BIOPSY  01/12/2018   Procedure: BIOPSY;  Surgeon: Meridee Score Netty Starring., MD;  Location: [facility] ENDOSCOPY;  Service: Gastroenterology;;   CARDIAC CATHETERIZATION     CARPAL TUNNEL RELEASE Right 2023   CHOLECYSTECTOMY     ESOPHAGOGASTRODUODENOSCOPY (EGD) WITH PROPOFOL N/A 01/12/2018   Procedure: ESOPHAGOGASTRODUODENOSCOPY (EGD) WITH PROPOFOL;  Surgeon: Lemar Lofty., MD;  Location: [facility];  Service: Gastroenterology;  Laterality: N/A;   EYE SURGERY Right    cataract   INTRAOPERATIVE TRANSTHORACIC ECHOCARDIOGRAM N/A 11/13/2021   Procedure:  INTRAOPERATIVE TRANSTHORACIC ECHOCARDIOGRAM;  Surgeon: Tonny Bollman, MD;  Location: [facility] OR;  Service: Open Heart Surgery;  Laterality: N/A;   left elobow surgery  2003   left rotator cuff  2001   LUMBAR LAMINECTOMY/DECOMPRESSION MICRODISCECTOMY Left 12/23/2017   Procedure: Laminectomy for facet/synovial cyst - left - Lumbar three-Lumbar four;  Surgeon: Julio Sicks, MD;  Location: [facility] OR;  Service: Neurosurgery;  Laterality: Left;   LUMBAR SPINE SURGERY  08/12/2022   r foot surgery  1995   REVERSE SHOULDER ARTHROPLASTY Left 10/23/2018   Procedure: REVERSE TOTAL SHOULDER ARTHROPLASTY;  Surgeon: Beverely Low, MD;  Location: [facility] ORS;  Service: Orthopedics;  Laterality: Left;  interscalene block   REVERSE SHOULDER ARTHROPLASTY Right 04/20/2021   Procedure: REVERSE SHOULDER ARTHROPLASTY;  Surgeon: Beverely Low, MD;  Location: [facility] ORS;  Service: Orthopedics;  Laterality: Right;  with ISB   right hand surgery  2001   RIGHT HEART CATH AND CORONARY ANGIOGRAPHY N/A 10/24/2021   Procedure: RIGHT HEART CATH AND CORONARY ANGIOGRAPHY;  Surgeon: Tonny Bollman, MD;  [facility]: [facility] INVASIVE CV LAB;  Service: Cardiovascular;  Laterality: N/A;   right index finger surgery  2009   SHOULDER OPEN ROTATOR CUFF REPAIR  11/21/2011   Procedure: ROTATOR CUFF REPAIR SHOULDER OPEN;  Surgeon: Javier Docker, MD;  Location: [facility] ORS;  Service: Orthopedics;  Laterality: Right;  with  subacromial decompression   SHOULDER OPEN ROTATOR CUFF REPAIR Right 05/09/2016   Procedure: Right shoulder mini open revision rotator cuff repair, subacromial decompression;  Surgeon: Jene Every, MD;  Location: [facility] ORS;  Service: Orthopedics;  Laterality: Right;   TOTAL HIP ARTHROPLASTY Right 04/24/2017   Procedure: RIGHT TOTAL HIP ARTHROPLASTY ANTERIOR APPROACH;  Surgeon: Samson Frederic, MD;  Location: [facility] ORS;  Service: Orthopedics;  Laterality: Right;  Needs RNFA   TRANSCATHETER AORTIC VALVE REPLACEMENT, TRANSFEMORAL N/A 11/13/2021    Procedure: Transcatheter Aortic Valve Replacement, Transfemoral Edwards 23 MM SAPIEN 3 Ultra;  Surgeon: Tonny Bollman, MD;  Location: [facility] OR;  Service: Open Heart Surgery;  Laterality: N/A;  Transfemoral approach   Patient Active Problem List   Diagnosis Date Noted   Lumbar adjacent segment disease with spondylolisthesis 08/13/2022   Degenerative spondylolisthesis 01/25/2022   S/P TAVR (transcatheter aortic valve replacement) 11/13/2021   Severe aortic stenosis    Urine frequency 07/26/2021   Depression 06/25/2021   S/P shoulder replacement, right 04/20/2021   Slow transit constipation 03/20/2021   Anemia 03/20/2021   Tick bite of abdomen 07/11/2020   Hyperlipidemia 06/22/2020   Type 2 diabetes, diet controlled (HCC) 06/22/2020   Chronic back pain 06/22/2020   Transaminitis 06/22/2020   PUD (peptic ulcer disease) 03/21/2018   Synovial cyst of lumbar facet joint 12/23/2017   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   Rotator cuff tear, right 11/21/2011    PCP: Mliss Sax, MD  REFERRING PROVIDER: Julio Sicks, MD   REFERRING DIAG: M43.10 (ICD-10-CM) - Spondylolisthesis, site unspecified   Rationale for Evaluation and Treatment: Rehabilitation  THERAPY DIAG:  Abnormal posture  Difficulty in walking, not elsewhere classified  Muscle weakness (generalized)  Unsteadiness on feet  ONSET DATE: 10/25/22  SUBJECTIVE:                                                                                                                                                                                           SUBJECTIVE STATEMENT:  Sore esp back , could be weather. Amb in with [facility]" I need to know how to use cane correctly"   Patient reports that she had lumbar surgery 07/25/22. Her Dr has cleared her for therapy. She has since had a fall, landing face first on her outside step on 11/04/22. She was cleared by Dr Jordan Likes to proceed with PT. She arrives wearing a soft collar.  PERTINENT  HISTORY:  TAVR 10/24/21, EF 70%, B Rot Cuff surgery, back surgery in 12/23 Per referring physician:  L3-4 decompression and fusion 07/25/22. Sustained a fall on 11/04/22  ER report: 82 year old female presenting for mechanical fall. She has  some facial bruising as well as small lip laceration on the lower lip. CT scans concerning for small subarachnoid hemorrhage. Discussed with neurosurgery who recommends no observation or repeat imaging, just outpatient follow-up. She remained stable here, able without difficulty. She will stop aspirin for a week and then follow-up with neurosurgery. Strict return precautions given. Discharged in stable condition.   PAIN:  Are you having pain? Yes: NPRS scale: 8/10 Pain location: low back Pain description: aching, stays in the back Aggravating factors: First thing in the morning, accumulated mobility towards the afternoon Relieving factors: Pain meds, lying down  PRECAUTIONS: Back and Fall  RED FLAGS: None   WEIGHT BEARING RESTRICTIONS: No  FALLS:  Has patient fallen in last 6 months? Yes. Number of falls 2 and a couple near falls.  LIVING ENVIRONMENT: Lives with: lives with their family and lives with their spouse Lives in: House/apartment Stairs: Yes: External: 2 steps; on right going up Has following equipment at home: Retail banker - 4 wheeled, grab bars in shower  OCCUPATION: N/A  PLOF: Independent  PATIENT GOALS: Patient would like to return to her PLOF, walk without an AD.  NEXT MD VISIT: Pool-12/26/22  OBJECTIVE:   DIAGNOSTIC FINDINGS:  CT cervical spine 11/04/22 IMPRESSION: 1. Trace acute subarachnoid hemorrhage along the bilateral anterior cingulate sulci. 2. No acute facial fracture. 3. No acute cervical spine fracture or traumatic malalignment  COGNITION: Overall cognitive status: Within functional limits for tasks assessed     SENSATION: WFL  MUSCLE LENGTH: Hamstrings: Right 65 deg; Left 60 deg Thomas  test: WNL B  POSTURE: rounded shoulders, forward head, increased thoracic kyphosis, and pelvis rotated post on L  PALPATION: TTP lower lumbar paraspinals on R, R gluts, piriformis and prox ITB  LUMBAR ROM:   AROM eval  Flexion Mid shin, pain in L leg  Extension deferred  Right lateral flexion knee  Left lateral flexion knee  Right rotation deferred  Left rotation deferred   (Blank rows = not tested)  LOWER EXTREMITY ROM:  B hips with decreased PROM in all planes, mild   LOWER EXTREMITY MMT:    MMT Right eval Left eval  Hip flexion 4 4  Hip extension 4- 3+  Hip abduction 4- 4-  Hip adduction    Hip internal rotation    Hip external rotation    Knee flexion 5 5  Knee extension 5 5  Ankle dorsiflexion    Ankle plantarflexion    Ankle inversion    Ankle eversion     (Blank rows = not tested)  LUMBAR SPECIAL TESTS:  Straight leg raise test: positive on R and Slump test: positive on L  FUNCTIONAL TESTS:  5 times sit to stand: 11.9, stable Timed up and go (TUG): 19.23, unsteady, especially during turns, no AD  GAIT: Distance walked: In clinic distances Assistive device utilized: None Level of assistance: Modified independence Comments: Patient was slow and unsteady during gait with multiple deviations including increased lateral sway, increased stance time B, shuffling. She reports that she uses a Rollator at home, but couldn't bring it. She has a cane in the car, but is unsure how to walk with it.  TODAY'S TREATMENT:  DATE:   11/14/22 Nustep L 4 5 min Gait training with SPC ( pt states uses walker at home) cuing for sequencing. Instability noted Pelvic ROM on sit fit 10 x 4 way Yellow tband scap stab on sit fit 4 way 10 x each 2# ankle wt LAQ,hip flex and abd on sit fit 10x 2# ankle wts HHA SL hip flex,ext and abd 10 x each 2# ankle wts HHA  marching fwd and back 10 feet 2 x each, then side stepping 10 feet 2 x each HS curl 15# 2 sets 10 LAQ 5# 2 sets 10        11/12/22 Education   PATIENT EDUCATION:  Education details: POC Person educated: Patient Education method: Explanation Education comprehension: verbalized understanding  HOME EXERCISE PROGRAM: TBD  ASSESSMENT:  CLINICAL IMPRESSION: Informed pt of this-*Consulted Dr Jordan Likes for clarification on patient wearing soft collar. He responds that they will discuss it at her next Dr. Visit. Continue to wear as ordered for now. Will inform patient at next visit. Initial visit after eval progressed ex,balance and gait- instanility noted. Assistance and cuing needed  OBJECTIVE IMPAIRMENTS: Abnormal gait, decreased activity tolerance, decreased balance, decreased coordination, difficulty walking, decreased ROM, decreased strength, improper body mechanics, and pain.   ACTIVITY LIMITATIONS: carrying, lifting, bending, squatting, stairs, and locomotion level  PARTICIPATION LIMITATIONS: meal prep, cleaning, laundry, driving, shopping, and community activity  PERSONAL FACTORS: Past/current experiences are also affecting patient's functional outcome.   REHAB POTENTIAL: Good  CLINICAL DECISION MAKING: Evolving/moderate complexity  EVALUATION COMPLEXITY: Moderate   GOALS: Goals reviewed with patient? Yes  SHORT TERM GOALS: Target date: 11/28/22  I with initial HEP Baseline: Goal status: INITIAL  LONG TERM GOALS: Target date: 01/21/23  I with final HEP Baseline:  Goal status: INITIAL  2.  TUG in < 12 sec Baseline: 19.23, unsteady, no AD Goal status: INITIAL  3.  Increase B hip strength to at least 4/5 Baseline:  Goal status: INITIAL  4.  Patient will ambulate at least 400' on level or slightly unlevel surfaces without AD, I, minimized gait deviations and no unsteadiness. Baseline: In clinic, max 100', unsteady, multiple gait deviations noted. Goal status:  INITIAL  5.  Patient will report at least 50% improvement in her pain symptoms with daily activities. Baseline: 6/10 Goal status: INITIAL  PLAN:  PT FREQUENCY: 1-2x/week  PT DURATION: 10 weeks  PLANNED INTERVENTIONS: Therapeutic exercises, Therapeutic activity, Neuromuscular re-education, Balance training, Gait training, Patient/Family education, Self Care, Joint mobilization, Stair training, Dry Needling, Electrical stimulation, Cryotherapy, Moist heat, Ionotophoresis 4mg /ml Dexamethasone, and Manual therapy.  PLAN FOR NEXT SESSION: HEP   Encounter Date: 11/14/2022   Nicola Girt 11/14/2022, 2:04 PM  [facility] [address] [facility] [place], [place], [address] Phone: [phone]   Fax:  [phone]

## 2022-11-22 ENCOUNTER — Encounter: Payer: Self-pay | Admitting: Physical Therapy

## 2022-11-22 ENCOUNTER — Ambulatory Visit: Payer: Medicare PPO | Attending: Family Medicine | Admitting: Physical Therapy

## 2022-11-22 DIAGNOSIS — R2681 Unsteadiness on feet: Secondary | ICD-10-CM | POA: Insufficient documentation

## 2022-11-22 DIAGNOSIS — R262 Difficulty in walking, not elsewhere classified: Secondary | ICD-10-CM | POA: Insufficient documentation

## 2022-11-22 DIAGNOSIS — M6281 Muscle weakness (generalized): Secondary | ICD-10-CM | POA: Insufficient documentation

## 2022-11-22 DIAGNOSIS — M4310 Spondylolisthesis, site unspecified: Secondary | ICD-10-CM | POA: Diagnosis not present

## 2022-11-22 DIAGNOSIS — R293 Abnormal posture: Secondary | ICD-10-CM | POA: Diagnosis not present

## 2022-11-22 NOTE — Therapy (Signed)
OUTPATIENT PHYSICAL THERAPY THORACOLUMBAR    Patient Name: Cheryl Cooper MRN: [id number] DOB:1941/01/08, 82 y.o., female Today's Date: 11/22/2022  END OF SESSION:  PT End of Session - 11/22/22 0851     Visit Number 3    Date for PT Re-Evaluation 01/21/23    PT Start Time 0845    PT Stop Time 0930    PT Time Calculation (min) 45 min    Activity Tolerance Patient tolerated treatment well    Behavior During Therapy WFL for tasks assessed/performed             Past Medical History:  Diagnosis Date   Anxiety    Arthritis    Carpal tunnel syndrome    Deafness in left ear    Depression    Diabetes mellitus    diet controlled   Diverticulitis    GERD (gastroesophageal reflux disease)    Hypercholesteremia    Hypertension    Hypothyroid    Memory loss    reports d/t concussion   PONV (postoperative nausea and vomiting) yrs ago, none recent   Post concussion syndrome    S/P TAVR (transcatheter aortic valve replacement) 11/13/2021   s/p TAVR with a 23 mm Edwards S3UR via the TF approach by Dr. Excell Seltzer and Dr. Leafy Ro   Severe aortic stenosis    Stroke [facility])    Past Surgical History:  Procedure Laterality Date   ABDOMINAL HYSTERECTOMY  1986   ANTERIOR LATERAL LUMBAR FUSION WITH PERCUTANEOUS SCREW 1 LEVEL Left 01/25/2022   Procedure: ANTERIOR LATERAL LUMBAR FUSION WITH PERCUTANEOUS SCREW LUMBAR THREE-FOUR;  Surgeon: Julio Sicks, MD;  Location: [facility] OR;  Service: Neurosurgery;  Laterality: Left;   BACK SURGERY  2010   lower   BIOPSY  01/12/2018   Procedure: BIOPSY;  Surgeon: Meridee Score Netty Starring., MD;  Location: [facility] ENDOSCOPY;  Service: Gastroenterology;;   CARDIAC CATHETERIZATION     CARPAL TUNNEL RELEASE Right 2023   CHOLECYSTECTOMY     ESOPHAGOGASTRODUODENOSCOPY (EGD) WITH PROPOFOL N/A 01/12/2018   Procedure: ESOPHAGOGASTRODUODENOSCOPY (EGD) WITH PROPOFOL;  Surgeon: Lemar Lofty., MD;  Location: [facility];  Service: Gastroenterology;  Laterality: N/A;    EYE SURGERY Right    cataract   INTRAOPERATIVE TRANSTHORACIC ECHOCARDIOGRAM N/A 11/13/2021   Procedure: INTRAOPERATIVE TRANSTHORACIC ECHOCARDIOGRAM;  Surgeon: Tonny Bollman, MD;  Location: [facility] OR;  Service: Open Heart Surgery;  Laterality: N/A;   left elobow surgery  2003   left rotator cuff  2001   LUMBAR LAMINECTOMY/DECOMPRESSION MICRODISCECTOMY Left 12/23/2017   Procedure: Laminectomy for facet/synovial cyst - left - Lumbar three-Lumbar four;  Surgeon: Julio Sicks, MD;  Location: [facility] OR;  Service: Neurosurgery;  Laterality: Left;   LUMBAR SPINE SURGERY  08/12/2022   r foot surgery  1995   REVERSE SHOULDER ARTHROPLASTY Left 10/23/2018   Procedure: REVERSE TOTAL SHOULDER ARTHROPLASTY;  Surgeon: Beverely Low, MD;  Location: [facility] ORS;  Service: Orthopedics;  Laterality: Left;  interscalene block   REVERSE SHOULDER ARTHROPLASTY Right 04/20/2021   Procedure: REVERSE SHOULDER ARTHROPLASTY;  Surgeon: Beverely Low, MD;  Location: [facility] ORS;  Service: Orthopedics;  Laterality: Right;  with ISB   right hand surgery  2001   RIGHT HEART CATH AND CORONARY ANGIOGRAPHY N/A 10/24/2021   Procedure: RIGHT HEART CATH AND CORONARY ANGIOGRAPHY;  Surgeon: Tonny Bollman, MD;  Location: [facility] INVASIVE CV LAB;  Service: Cardiovascular;  Laterality: N/A;   right index finger surgery  2009   SHOULDER OPEN ROTATOR CUFF REPAIR  11/21/2011   Procedure: ROTATOR CUFF REPAIR SHOULDER  OPEN;  Surgeon: Javier Docker, MD;  Location: [facility] ORS;  Service: Orthopedics;  Laterality: Right;  with subacromial decompression   SHOULDER OPEN ROTATOR CUFF REPAIR Right 05/09/2016   Procedure: Right shoulder mini open revision rotator cuff repair, subacromial decompression;  Surgeon: Jene Every, MD;  Location: [facility] ORS;  Service: Orthopedics;  Laterality: Right;   TOTAL HIP ARTHROPLASTY Right 04/24/2017   Procedure: RIGHT TOTAL HIP ARTHROPLASTY ANTERIOR APPROACH;  Surgeon: Samson Frederic, MD;  Location: [facility] ORS;  Service: Orthopedics;   Laterality: Right;  Needs RNFA   TRANSCATHETER AORTIC VALVE REPLACEMENT, TRANSFEMORAL N/A 11/13/2021   Procedure: Transcatheter Aortic Valve Replacement, Transfemoral Edwards 23 MM SAPIEN 3 Ultra;  Surgeon: Tonny Bollman, MD;  Location: [facility] OR;  Service: Open Heart Surgery;  Laterality: N/A;  Transfemoral approach   Patient Active Problem List   Diagnosis Date Noted   Lumbar adjacent segment disease with spondylolisthesis 08/13/2022   Degenerative spondylolisthesis 01/25/2022   S/P TAVR (transcatheter aortic valve replacement) 11/13/2021   Severe aortic stenosis    Urine frequency 07/26/2021   Depression 06/25/2021   S/P shoulder replacement, right 04/20/2021   Slow transit constipation 03/20/2021   Anemia 03/20/2021   Tick bite of abdomen 07/11/2020   Hyperlipidemia 06/22/2020   Type 2 diabetes, diet controlled (HCC) 06/22/2020   Chronic back pain 06/22/2020   Transaminitis 06/22/2020   PUD (peptic ulcer disease) 03/21/2018   Synovial cyst of lumbar facet joint 12/23/2017   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   Rotator cuff tear, right 11/21/2011    PCP: Mliss Sax, MD  REFERRING PROVIDER: Julio Sicks, MD   REFERRING DIAG: M43.10 (ICD-10-CM) - Spondylolisthesis, site unspecified   Rationale for Evaluation and Treatment: Rehabilitation  THERAPY DIAG:  Abnormal posture  Difficulty in walking, not elsewhere classified  Muscle weakness (generalized)  Unsteadiness on feet  ONSET DATE: 10/25/22  SUBJECTIVE:                                                                                                                                                                                           SUBJECTIVE STATEMENT:  I am alittle sore in the back and am unsteady   Patient reports that she had lumbar surgery 07/25/22. Her Dr has cleared her for therapy. She has since had a fall, landing face first on her outside step on 11/04/22. She was cleared by Dr Jordan Likes to proceed  with PT. She arrives wearing a soft collar.  PERTINENT HISTORY:  TAVR 10/24/21, EF 70%, B Rot Cuff surgery, back surgery in 12/23 Per referring physician:  L3-4 decompression and fusion 07/25/22. Sustained a fall on 11/04/22  ER  report: 82 year old female presenting for mechanical fall. She has some facial bruising as well as small lip laceration on the lower lip. CT scans concerning for small subarachnoid hemorrhage. Discussed with neurosurgery who recommends no observation or repeat imaging, just outpatient follow-up. She remained stable here, able without difficulty. She will stop aspirin for a week and then follow-up with neurosurgery. Strict return precautions given. Discharged in stable condition.   PAIN:  Are you having pain? Yes: NPRS scale: 8/10 Pain location: low back Pain description: aching, stays in the back Aggravating factors: First thing in the morning, accumulated mobility towards the afternoon Relieving factors: Pain meds, lying down  PRECAUTIONS: Back and Fall  RED FLAGS: None   WEIGHT BEARING RESTRICTIONS: No  FALLS:  Has patient fallen in last 6 months? Yes. Number of falls 2 and a couple near falls.  LIVING ENVIRONMENT: Lives with: lives with their family and lives with their spouse Lives in: House/apartment Stairs: Yes: External: 2 steps; on right going up Has following equipment at home: Retail banker - 4 wheeled, grab bars in shower  OCCUPATION: N/A  PLOF: Independent  PATIENT GOALS: Patient would like to return to her PLOF, walk without an AD.  NEXT MD VISIT: Pool-12/26/22  OBJECTIVE:   DIAGNOSTIC FINDINGS:  CT cervical spine 11/04/22 IMPRESSION: 1. Trace acute subarachnoid hemorrhage along the bilateral anterior cingulate sulci. 2. No acute facial fracture. 3. No acute cervical spine fracture or traumatic malalignment  COGNITION: Overall cognitive status: Within functional limits for tasks  assessed     SENSATION: WFL  MUSCLE LENGTH: Hamstrings: Right 65 deg; Left 60 deg Thomas test: WNL B  POSTURE: rounded shoulders, forward head, increased thoracic kyphosis, and pelvis rotated post on L  PALPATION: TTP lower lumbar paraspinals on R, R gluts, piriformis and prox ITB  LUMBAR ROM:   AROM eval  Flexion Mid shin, pain in L leg  Extension deferred  Right lateral flexion knee  Left lateral flexion knee  Right rotation deferred  Left rotation deferred   (Blank rows = not tested)  LOWER EXTREMITY ROM:  B hips with decreased PROM in all planes, mild   LOWER EXTREMITY MMT:    MMT Right eval Left eval  Hip flexion 4 4  Hip extension 4- 3+  Hip abduction 4- 4-  Hip adduction    Hip internal rotation    Hip external rotation    Knee flexion 5 5  Knee extension 5 5  Ankle dorsiflexion    Ankle plantarflexion    Ankle inversion    Ankle eversion     (Blank rows = not tested)  LUMBAR SPECIAL TESTS:  Straight leg raise test: positive on R and Slump test: positive on L  FUNCTIONAL TESTS:  5 times sit to stand: 11.9, stable Timed up and go (TUG): 19.23, unsteady, especially during turns, no AD  GAIT: Distance walked: In clinic distances Assistive device utilized: None Level of assistance: Modified independence Comments: Patient was slow and unsteady during gait with multiple deviations including increased lateral sway, increased stance time B, shuffling. She reports that she uses a Rollator at home, but couldn't bring it. She has a cane in the car, but is unsure how to walk with it.  TODAY'S TREATMENT:  DATE:  11/22/22 Bike level 3 x 5 minutes Nustep level 4 x 5 minutes 2# LAQ 2# marches Ball b/n knees squeeze Red tband clamshells Feet on ball K2C, rotation, small bridges, isometric abs Standing on airex red tband rows and  extension 15# leg curls Gentle HS and piriformis stretches  11/14/22 Nustep L 4 5 min Gait training with SPC ( pt states uses walker at home) cuing for sequencing. Instability noted Pelvic ROM on sit fit 10 x 4 way Yellow tband scap stab on sit fit 4 way 10 x each 2# ankle wt LAQ,hip flex and abd on sit fit 10x 2# ankle wts HHA SL hip flex,ext and abd 10 x each 2# ankle wts HHA marching fwd and back 10 feet 2 x each, then side stepping 10 feet 2 x each HS curl 15# 2 sets 10 LAQ 5# 2 sets 10        11/12/22 Education   PATIENT EDUCATION:  Education details: POC Person educated: Patient Education method: Explanation Education comprehension: verbalized understanding  HOME EXERCISE PROGRAM: TBD  ASSESSMENT:  CLINICAL IMPRESSION: Informed pt of this-*Consulted Dr Jordan Likes for clarification on patient wearing soft collar. He responds that they will discuss it at her next Dr. Visit. Continue to wear as ordered for now. Worked on adding strength some balance and light stretch, not steady with gait, needs SBA in clinc, showed her some calf and piriformis stretches for HEP  OBJECTIVE IMPAIRMENTS: Abnormal gait, decreased activity tolerance, decreased balance, decreased coordination, difficulty walking, decreased ROM, decreased strength, improper body mechanics, and pain.   ACTIVITY LIMITATIONS: carrying, lifting, bending, squatting, stairs, and locomotion level  PARTICIPATION LIMITATIONS: meal prep, cleaning, laundry, driving, shopping, and community activity  PERSONAL FACTORS: Past/current experiences are also affecting patient's functional outcome.   REHAB POTENTIAL: Good  CLINICAL DECISION MAKING: Evolving/moderate complexity  EVALUATION COMPLEXITY: Moderate   GOALS: Goals reviewed with patient? Yes  SHORT TERM GOALS: Target date: 11/28/22  I with initial HEP Baseline: Goal status: INITIAL  LONG TERM GOALS: Target date: 01/21/23  I with final HEP Baseline:  Goal  status: INITIAL  2.  TUG in < 12 sec Baseline: 19.23, unsteady, no AD Goal status: INITIAL  3.  Increase B hip strength to at least 4/5 Baseline:  Goal status: INITIAL  4.  Patient will ambulate at least 400' on level or slightly unlevel surfaces without AD, I, minimized gait deviations and no unsteadiness. Baseline: In clinic, max 100', unsteady, multiple gait deviations noted. Goal status: INITIAL  5.  Patient will report at least 50% improvement in her pain symptoms with daily activities. Baseline: 6/10 Goal status: INITIAL  PLAN:  PT FREQUENCY: 1-2x/week  PT DURATION: 10 weeks  PLANNED INTERVENTIONS: Therapeutic exercises, Therapeutic activity, Neuromuscular re-education, Balance training, Gait training, Patient/Family education, Self Care, Joint mobilization, Stair training, Dry Needling, Electrical stimulation, Cryotherapy, Moist heat, Ionotophoresis 4mg /ml Dexamethasone, and Manual therapy.  PLAN FOR NEXT SESSION: add exercises for strength and balance as tolerated  Encounter Date: 11/22/2022   Jearld Lesch, PT 11/22/2022, 8:52 AM  [facility] [address] [place], [place], [address] Phone: [phone]   Fax:  [phone]

## 2022-11-26 ENCOUNTER — Encounter: Payer: Self-pay | Admitting: Physical Therapy

## 2022-11-26 ENCOUNTER — Ambulatory Visit: Payer: Medicare PPO | Admitting: Physical Therapy

## 2022-11-26 DIAGNOSIS — M6281 Muscle weakness (generalized): Secondary | ICD-10-CM | POA: Diagnosis not present

## 2022-11-26 DIAGNOSIS — R2681 Unsteadiness on feet: Secondary | ICD-10-CM | POA: Diagnosis not present

## 2022-11-26 DIAGNOSIS — M4310 Spondylolisthesis, site unspecified: Secondary | ICD-10-CM

## 2022-11-26 DIAGNOSIS — R293 Abnormal posture: Secondary | ICD-10-CM | POA: Diagnosis not present

## 2022-11-26 DIAGNOSIS — R262 Difficulty in walking, not elsewhere classified: Secondary | ICD-10-CM | POA: Diagnosis not present

## 2022-11-26 NOTE — Therapy (Signed)
OUTPATIENT PHYSICAL THERAPY THORACOLUMBAR    Patient Name: Cheryl Cooper MRN: [id number] DOB:[birth year], 82 y.o., female Today's Date: 11/26/2022  END OF SESSION:  PT End of Session - 11/26/22 1023     Visit Number 4    Date for PT Re-Evaluation 01/21/23    PT Start Time 1016    PT Stop Time 1055    PT Time Calculation (min) 39 min    Activity Tolerance Patient tolerated treatment well    Behavior During Therapy WFL for tasks assessed/performed            Past Medical History:  Diagnosis Date   Anxiety    Arthritis    Carpal tunnel syndrome    Deafness in left ear    Depression    Diabetes mellitus    diet controlled   Diverticulitis    GERD (gastroesophageal reflux disease)    Hypercholesteremia    Hypertension    Hypothyroid    Memory loss    reports d/t concussion   PONV (postoperative nausea and vomiting) yrs ago, none recent   Post concussion syndrome    S/P TAVR (transcatheter aortic valve replacement) 11/13/2021   s/p TAVR with a 23 mm Edwards S3UR via the TF approach by Dr. Excell Seltzer and Dr. Leafy Ro   Severe aortic stenosis    Stroke [facility])    Past Surgical History:  Procedure Laterality Date   ABDOMINAL HYSTERECTOMY  1986   ANTERIOR LATERAL LUMBAR FUSION WITH PERCUTANEOUS SCREW 1 LEVEL Left 01/25/2022   Procedure: ANTERIOR LATERAL LUMBAR FUSION WITH PERCUTANEOUS SCREW LUMBAR THREE-FOUR;  Surgeon: Julio Sicks, MD;  [facility];  Service: Neurosurgery;  Laterality: Left;   BACK SURGERY  2010   lower   BIOPSY  01/12/2018   Procedure: BIOPSY;  Surgeon: Meridee Score Netty Starring., MD;  [facility];  Service: Gastroenterology;;   CARDIAC CATHETERIZATION     CARPAL TUNNEL RELEASE Right 2023   CHOLECYSTECTOMY     ESOPHAGOGASTRODUODENOSCOPY (EGD) WITH PROPOFOL N/A 01/12/2018   Procedure: ESOPHAGOGASTRODUODENOSCOPY (EGD) WITH PROPOFOL;  Surgeon: Lemar Lofty., MD;  Location: [facility];  Service: Gastroenterology;  Laterality: N/A;   EYE  SURGERY Right    cataract   INTRAOPERATIVE TRANSTHORACIC ECHOCARDIOGRAM N/A 11/13/2021   Procedure: INTRAOPERATIVE TRANSTHORACIC ECHOCARDIOGRAM;  Surgeon: Tonny Bollman, MD;  Location: [facility] OR;  Service: Open Heart Surgery;  Laterality: N/A;   left elobow surgery  2003   left rotator cuff  2001   LUMBAR LAMINECTOMY/DECOMPRESSION MICRODISCECTOMY Left 12/23/2017   Procedure: Laminectomy for facet/synovial cyst - left - Lumbar three-Lumbar four;  Surgeon: Julio Sicks, MD;  Location: [facility] OR;  Service: Neurosurgery;  Laterality: Left;   LUMBAR SPINE SURGERY  08/12/2022   r foot surgery  1995   REVERSE SHOULDER ARTHROPLASTY Left 10/23/2018   Procedure: REVERSE TOTAL SHOULDER ARTHROPLASTY;  Surgeon: Beverely Low, MD;  [facility];  Service: Orthopedics;  Laterality: Left;  interscalene block   REVERSE SHOULDER ARTHROPLASTY Right 04/20/2021   Procedure: REVERSE SHOULDER ARTHROPLASTY;  Surgeon: Beverely Low, MD;  [facility];  Service: Orthopedics;  Laterality: Right;  with ISB   right hand surgery  2001   RIGHT HEART CATH AND CORONARY ANGIOGRAPHY N/A 10/24/2021   Procedure: RIGHT HEART CATH AND CORONARY ANGIOGRAPHY;  Surgeon: Tonny Bollman, MD;  Location: [facility] INVASIVE CV LAB;  Service: Cardiovascular;  Laterality: N/A;   right index finger surgery  2009   SHOULDER OPEN ROTATOR CUFF REPAIR  11/21/2011   Procedure: ROTATOR CUFF REPAIR SHOULDER OPEN;  Surgeon: Javier Docker, MD;  [facility];  Service: Orthopedics;  Laterality: Right;  with subacromial decompression   SHOULDER OPEN ROTATOR CUFF REPAIR Right 05/09/2016   Procedure: Right shoulder mini open revision rotator cuff repair, subacromial decompression;  Surgeon: Jene Every, MD;  [facility];  Service: Orthopedics;  Laterality: Right;   TOTAL HIP ARTHROPLASTY Right 04/24/2017   Procedure: RIGHT TOTAL HIP ARTHROPLASTY ANTERIOR APPROACH;  Surgeon: Samson Frederic, MD;  [facility];  Service: Orthopedics;  Laterality:  Right;  Needs RNFA   TRANSCATHETER AORTIC VALVE REPLACEMENT, TRANSFEMORAL N/A 11/13/2021   Procedure: Transcatheter Aortic Valve Replacement, Transfemoral Edwards 23 MM SAPIEN 3 Ultra;  Surgeon: Tonny Bollman, MD;  Location: [facility] OR;  Service: Open Heart Surgery;  Laterality: N/A;  Transfemoral approach   Patient Active Problem List   Diagnosis Date Noted   Lumbar adjacent segment disease with spondylolisthesis 08/13/2022   Degenerative spondylolisthesis 01/25/2022   S/P TAVR (transcatheter aortic valve replacement) 11/13/2021   Severe aortic stenosis    Urine frequency 07/26/2021   Depression 06/25/2021   S/P shoulder replacement, right 04/20/2021   Slow transit constipation 03/20/2021   Anemia 03/20/2021   Tick bite of abdomen 07/11/2020   Hyperlipidemia 06/22/2020   Type 2 diabetes, diet controlled (HCC) 06/22/2020   Chronic back pain 06/22/2020   Transaminitis 06/22/2020   PUD (peptic ulcer disease) 03/21/2018   Synovial cyst of lumbar facet joint 12/23/2017   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   Rotator cuff tear, right 11/21/2011   PCP: Mliss Sax, MD  REFERRING PROVIDER: Julio Sicks, MD  REFERRING DIAG: M43.10 (ICD-10-CM) - Spondylolisthesis, site unspecified   Rationale for Evaluation and Treatment: Rehabilitation  THERAPY DIAG:  Abnormal posture  Difficulty in walking, not elsewhere classified  Muscle weakness (generalized)  Unsteadiness on feet  Acquired spondylolisthesis  ONSET DATE: 10/25/22  SUBJECTIVE:                                                                                                                                                                                           SUBJECTIVE STATEMENT: Patient reports that she was very limited on Friday due to fatigue. She is trying to back off on her pain meds.  Patient reports that she had lumbar surgery 07/25/22. Her Dr has cleared her for therapy. She has since had a fall, landing face  first on her outside step on 11/04/22. She was cleared by Dr Jordan Likes to proceed with PT. She arrives wearing a soft collar.  PERTINENT HISTORY:  TAVR 10/24/21, EF 70%, B Rot Cuff surgery, back surgery in 12/23 Per referring physician:  L3-4 decompression and  fusion 07/25/22. Sustained a fall on 11/04/22  ER report: 82 year old female presenting for mechanical fall. She has some facial bruising as well as small lip laceration on the lower lip. CT scans concerning for small subarachnoid hemorrhage. Discussed with neurosurgery who recommends no observation or repeat imaging, just outpatient follow-up. She remained stable here, able without difficulty. She will stop aspirin for a week and then follow-up with neurosurgery. Strict return precautions given. Discharged in stable condition.   PAIN:  Are you having pain? Yes: NPRS scale: 8/10 Pain location: low back Pain description: aching, stays in the back Aggravating factors: First thing in the morning, accumulated mobility towards the afternoon Relieving factors: Pain meds, lying down  PRECAUTIONS: Back and Fall  RED FLAGS: None   WEIGHT BEARING RESTRICTIONS: No  FALLS:  Has patient fallen in last 6 months? Yes. Number of falls 2 and a couple near falls.  LIVING ENVIRONMENT: Lives with: lives with their family and lives with their spouse Lives in: House/apartment Stairs: Yes: External: 2 steps; on right going up Has following equipment at home: Retail banker - 4 wheeled, grab bars in shower  OCCUPATION: N/A  PLOF: Independent  PATIENT GOALS: Patient would like to return to her PLOF, walk without an AD.  NEXT MD VISIT: Pool-12/26/22  OBJECTIVE:   DIAGNOSTIC FINDINGS:  CT cervical spine 11/04/22 IMPRESSION: 1. Trace acute subarachnoid hemorrhage along the bilateral anterior cingulate sulci. 2. No acute facial fracture. 3. No acute cervical spine fracture or traumatic malalignment  COGNITION: Overall cognitive  status: Within functional limits for tasks assessed     SENSATION: WFL  MUSCLE LENGTH: Hamstrings: Right 65 deg; Left 60 deg Thomas test: WNL B  POSTURE: rounded shoulders, forward head, increased thoracic kyphosis, and pelvis rotated post on L  PALPATION: TTP lower lumbar paraspinals on R, R gluts, piriformis and prox ITB  LUMBAR ROM:   AROM eval  Flexion Mid shin, pain in L leg  Extension deferred  Right lateral flexion knee  Left lateral flexion knee  Right rotation deferred  Left rotation deferred   (Blank rows = not tested)  LOWER EXTREMITY ROM:  B hips with decreased PROM in all planes, mild   LOWER EXTREMITY MMT:    MMT Right eval Left eval  Hip flexion 4 4  Hip extension 4- 3+  Hip abduction 4- 4-  Hip adduction    Hip internal rotation    Hip external rotation    Knee flexion 5 5  Knee extension 5 5  Ankle dorsiflexion    Ankle plantarflexion    Ankle inversion    Ankle eversion     (Blank rows = not tested)  LUMBAR SPECIAL TESTS:  Straight leg raise test: positive on R and Slump test: positive on L  FUNCTIONAL TESTS:  5 times sit to stand: 11.9, stable Timed up and go (TUG): 19.23, unsteady, especially during turns, no AD  GAIT: Distance walked: In clinic distances Assistive device utilized: None Level of assistance: Modified independence Comments: Patient was slow and unsteady during gait with multiple deviations including increased lateral sway, increased stance time B, shuffling. She reports that she uses a Rollator at home, but couldn't bring it. She has a cane in the car, but is unsure how to walk with it.  TODAY'S TREATMENT:  DATE:  11/26/22 NuStep L5 x 6 minutes Supine stretching for hips and low back- piriformis, gluts, SKTC, DKTC Supine pelvic tilts- unable to accurately perform Bridging, clamshells, ball squeeze  x 10 each Standing with counter support-mini squats, heel raises, side to side step against G tband x 10 each   11/22/22 Bike level 3 x 5 minutes Nustep level 4 x 5 minutes 2# LAQ 2# marches Ball b/n knees squeeze Red tband clamshells Feet on ball K2C, rotation, small bridges, isometric abs Standing on airex red tband rows and extension 15# leg curls Gentle HS and piriformis stretches  11/14/22 Nustep L 4 5 min Gait training with SPC ( pt states uses walker at home) cuing for sequencing. Instability noted Pelvic ROM on sit fit 10 x 4 way Yellow tband scap stab on sit fit 4 way 10 x each 2# ankle wt LAQ,hip flex and abd on sit fit 10x 2# ankle wts HHA SL hip flex,ext and abd 10 x each 2# ankle wts HHA marching fwd and back 10 feet 2 x each, then side stepping 10 feet 2 x each HS curl 15# 2 sets 10 LAQ 5# 2 sets 10  11/12/22 Education   PATIENT EDUCATION:  Education details: POC Person educated: Patient Education method: Explanation Education comprehension: verbalized understanding  HOME EXERCISE PROGRAM:  [id number]  ASSESSMENT:  CLINICAL IMPRESSION: Informed pt of this-*Consulted Dr Jordan Likes for clarification on patient wearing soft collar. He responds that they will discuss it at her next Dr. Visit. Continue to wear as ordered for now. Patient reports that she was very sore after last treatment. Today's treatment focused on stretching and strengthening to her trunk and LE. Established HEP with education to perform parts of it daily.  OBJECTIVE IMPAIRMENTS: Abnormal gait, decreased activity tolerance, decreased balance, decreased coordination, difficulty walking, decreased ROM, decreased strength, improper body mechanics, and pain.   ACTIVITY LIMITATIONS: carrying, lifting, bending, squatting, stairs, and locomotion level  PARTICIPATION LIMITATIONS: meal prep, cleaning, laundry, driving, shopping, and community activity  PERSONAL FACTORS: Past/current experiences are also  affecting patient's functional outcome.   REHAB POTENTIAL: Good  CLINICAL DECISION MAKING: Evolving/moderate complexity  EVALUATION COMPLEXITY: Moderate   GOALS: Goals reviewed with patient? Yes  SHORT TERM GOALS: Target date: 11/28/22  I with initial HEP Baseline: Goal status: 11/26/22 Provided, ongoing  LONG TERM GOALS: Target date: 01/21/23  I with final HEP Baseline:  Goal status: INITIAL  2.  TUG in < 12 sec Baseline: 19.23, unsteady, no AD Goal status: INITIAL  3.  Increase B hip strength to at least 4/5 Baseline:  Goal status: INITIAL  4.  Patient will ambulate at least 400' on level or slightly unlevel surfaces without AD, I, minimized gait deviations and no unsteadiness. Baseline: In clinic, max 100', unsteady, multiple gait deviations noted. Goal status: INITIAL  5.  Patient will report at least 50% improvement in her pain symptoms with daily activities. Baseline: 6/10 Goal status: INITIAL  PLAN:  PT FREQUENCY: 1-2x/week  PT DURATION: 10 weeks  PLANNED INTERVENTIONS: Therapeutic exercises, Therapeutic activity, Neuromuscular re-education, Balance training, Gait training, Patient/Family education, Self Care, Joint mobilization, Stair training, Dry Needling, Electrical stimulation, Cryotherapy, Moist heat, Ionotophoresis 4mg /ml Dexamethasone, and Manual therapy.  PLAN FOR NEXT SESSION: add exercises for strength and balance as tolerated  Encounter Date: 11/26/2022   Iona Beard, PT 11/26/2022, 11:06 AM  [facility] [place] [facility] [address] [facility] [place], [place], [address] Phone: [phone]   Fax:  [phone]

## 2022-11-28 ENCOUNTER — Ambulatory Visit: Payer: Medicare PPO | Admitting: Physical Therapy

## 2022-11-28 DIAGNOSIS — R293 Abnormal posture: Secondary | ICD-10-CM

## 2022-11-28 DIAGNOSIS — M4310 Spondylolisthesis, site unspecified: Secondary | ICD-10-CM | POA: Diagnosis not present

## 2022-11-28 DIAGNOSIS — M6281 Muscle weakness (generalized): Secondary | ICD-10-CM

## 2022-11-28 DIAGNOSIS — R2681 Unsteadiness on feet: Secondary | ICD-10-CM

## 2022-11-28 DIAGNOSIS — R262 Difficulty in walking, not elsewhere classified: Secondary | ICD-10-CM | POA: Diagnosis not present

## 2022-11-28 NOTE — Therapy (Signed)
activity, Neuromuscular re-education, Balance training, Gait training, Patient/Family education, Self Care, Joint mobilization, Stair training, Dry Needling, Electrical stimulation, Cryotherapy, Moist heat, Ionotophoresis 4mg /ml Dexamethasone, and Manual therapy.  PLAN FOR NEXT SESSION: add exercises for strength and balance as tolerated  Encounter Date: 11/28/2022   Suanne Marker, PTA 11/28/2022, 10:57 AM  [facility] at [facility] [address] [facility]. [place], [place], [address] Phone: [phone]   Fax:  [phone] [address] [facility] [facility] [address] [place], [place], [address] Phone: [phone]   Fax:  [phone]  Patient  Details  Name: Cheryl Cooper MRN: [id number] Date of Birth: [birth year] Referring Provider:  Mliss Sax,*  activity, Neuromuscular re-education, Balance training, Gait training, Patient/Family education, Self Care, Joint mobilization, Stair training, Dry Needling, Electrical stimulation, Cryotherapy, Moist heat, Ionotophoresis 4mg /ml Dexamethasone, and Manual therapy.  PLAN FOR NEXT SESSION: add exercises for strength and balance as tolerated  Encounter Date: 11/28/2022   Suanne Marker, PTA 11/28/2022, 10:57 AM  [facility] at [facility] [address] [facility]. [place], [place], [address] Phone: [phone]   Fax:  [phone] [address] [facility] [address] [place], [place], [address] Phone: [phone]   Fax:  [phone]  Patient  Details  Name: Cheryl Cooper MRN: [id number] Date of Birth: [birth year] Referring Provider:  Mliss Sax,*  OUTPATIENT PHYSICAL THERAPY THORACOLUMBAR    Patient Name: Cheryl Cooper MRN: [id number] DOB:1940/12/29, 82 y.o., female Today's Date: 11/28/2022  END OF SESSION:  PT End of Session - 11/28/22 1057     Visit Number 5    Date for PT Re-Evaluation 01/21/23    PT Start Time 1057    PT Stop Time 1145    PT Time Calculation (min) 48 min            Past Medical History:  Diagnosis Date   Anxiety    Arthritis    Carpal tunnel syndrome    Deafness in left ear    Depression    Diabetes mellitus    diet controlled   Diverticulitis    GERD (gastroesophageal reflux disease)    Hypercholesteremia    Hypertension    Hypothyroid    Memory loss    reports d/t concussion   PONV (postoperative nausea and vomiting) yrs ago, none recent   Post concussion syndrome    S/P TAVR (transcatheter aortic valve replacement) 11/13/2021   s/p TAVR with a 23 mm Edwards S3UR via the TF approach by Dr. Excell Seltzer and Dr. Leafy Ro   Severe aortic stenosis    Stroke [facility])    Past Surgical History:  Procedure Laterality Date   ABDOMINAL HYSTERECTOMY  1986   ANTERIOR LATERAL LUMBAR FUSION WITH PERCUTANEOUS SCREW 1 LEVEL Left 01/25/2022   Procedure: ANTERIOR LATERAL LUMBAR FUSION WITH PERCUTANEOUS SCREW LUMBAR THREE-FOUR;  Surgeon: Julio Sicks, MD;  [facility]: [facility] OR;  Service: Neurosurgery;  Laterality: Left;   BACK SURGERY  2010   lower   BIOPSY  01/12/2018   Procedure: BIOPSY;  Surgeon: Meridee Score Netty Starring., MD;  [facility];  Service: Gastroenterology;;   CARDIAC CATHETERIZATION     CARPAL TUNNEL RELEASE Right 2023   CHOLECYSTECTOMY     ESOPHAGOGASTRODUODENOSCOPY (EGD) WITH PROPOFOL N/A 01/12/2018   Procedure: ESOPHAGOGASTRODUODENOSCOPY (EGD) WITH PROPOFOL;  Surgeon: Lemar Lofty., MD;  Location: [facility];  Service: Gastroenterology;  Laterality: N/A;   EYE SURGERY Right    cataract   INTRAOPERATIVE TRANSTHORACIC ECHOCARDIOGRAM N/A 11/13/2021   Procedure:  INTRAOPERATIVE TRANSTHORACIC ECHOCARDIOGRAM;  Surgeon: Tonny Bollman, MD;  Location: [facility] OR;  Service: Open Heart Surgery;  Laterality: N/A;   left elobow surgery  2003   left rotator cuff  2001   LUMBAR LAMINECTOMY/DECOMPRESSION MICRODISCECTOMY Left 12/23/2017   Procedure: Laminectomy for facet/synovial cyst - left - Lumbar three-Lumbar four;  Surgeon: Julio Sicks, MD;  Location: [facility] OR;  Service: Neurosurgery;  Laterality: Left;   LUMBAR SPINE SURGERY  08/12/2022   r foot surgery  1995   REVERSE SHOULDER ARTHROPLASTY Left 10/23/2018   Procedure: REVERSE TOTAL SHOULDER ARTHROPLASTY;  Surgeon: Beverely Low, MD;  [facility];  Service: Orthopedics;  Laterality: Left;  interscalene block   REVERSE SHOULDER ARTHROPLASTY Right 04/20/2021   Procedure: REVERSE SHOULDER ARTHROPLASTY;  Surgeon: Beverely Low, MD;  [facility];  Service: Orthopedics;  Laterality: Right;  with ISB   right hand surgery  2001   RIGHT HEART CATH AND CORONARY ANGIOGRAPHY N/A 10/24/2021   Procedure: RIGHT HEART CATH AND CORONARY ANGIOGRAPHY;  Surgeon: Tonny Bollman, MD;  Location: [facility] INVASIVE CV LAB;  Service: Cardiovascular;  Laterality: N/A;   right index finger surgery  2009   SHOULDER OPEN ROTATOR CUFF REPAIR  11/21/2011   Procedure: ROTATOR CUFF REPAIR SHOULDER OPEN;  Surgeon: Javier Docker, MD;  [facility];  Service: Orthopedics;  Laterality: Right;  with subacromial  OUTPATIENT PHYSICAL THERAPY THORACOLUMBAR    Patient Name: Cheryl Cooper MRN: [id number] DOB:1940/12/29, 82 y.o., female Today's Date: 11/28/2022  END OF SESSION:  PT End of Session - 11/28/22 1057     Visit Number 5    Date for PT Re-Evaluation 01/21/23    PT Start Time 1057    PT Stop Time 1145    PT Time Calculation (min) 48 min            Past Medical History:  Diagnosis Date   Anxiety    Arthritis    Carpal tunnel syndrome    Deafness in left ear    Depression    Diabetes mellitus    diet controlled   Diverticulitis    GERD (gastroesophageal reflux disease)    Hypercholesteremia    Hypertension    Hypothyroid    Memory loss    reports d/t concussion   PONV (postoperative nausea and vomiting) yrs ago, none recent   Post concussion syndrome    S/P TAVR (transcatheter aortic valve replacement) 11/13/2021   s/p TAVR with a 23 mm Edwards S3UR via the TF approach by Dr. Excell Seltzer and Dr. Leafy Ro   Severe aortic stenosis    Stroke [facility])    Past Surgical History:  Procedure Laterality Date   ABDOMINAL HYSTERECTOMY  1986   ANTERIOR LATERAL LUMBAR FUSION WITH PERCUTANEOUS SCREW 1 LEVEL Left 01/25/2022   Procedure: ANTERIOR LATERAL LUMBAR FUSION WITH PERCUTANEOUS SCREW LUMBAR THREE-FOUR;  Surgeon: Julio Sicks, MD;  [facility];  Service: Neurosurgery;  Laterality: Left;   BACK SURGERY  2010   lower   BIOPSY  01/12/2018   Procedure: BIOPSY;  Surgeon: Meridee Score Netty Starring., MD;  [facility];  Service: Gastroenterology;;   CARDIAC CATHETERIZATION     CARPAL TUNNEL RELEASE Right 2023   CHOLECYSTECTOMY     ESOPHAGOGASTRODUODENOSCOPY (EGD) WITH PROPOFOL N/A 01/12/2018   Procedure: ESOPHAGOGASTRODUODENOSCOPY (EGD) WITH PROPOFOL;  Surgeon: Lemar Lofty., MD;  Location: [facility];  Service: Gastroenterology;  Laterality: N/A;   EYE SURGERY Right    cataract   INTRAOPERATIVE TRANSTHORACIC ECHOCARDIOGRAM N/A 11/13/2021   Procedure:  INTRAOPERATIVE TRANSTHORACIC ECHOCARDIOGRAM;  Surgeon: Tonny Bollman, MD;  Location: [facility] OR;  Service: Open Heart Surgery;  Laterality: N/A;   left elobow surgery  2003   left rotator cuff  2001   LUMBAR LAMINECTOMY/DECOMPRESSION MICRODISCECTOMY Left 12/23/2017   Procedure: Laminectomy for facet/synovial cyst - left - Lumbar three-Lumbar four;  Surgeon: Julio Sicks, MD;  Location: [facility] OR;  Service: Neurosurgery;  Laterality: Left;   LUMBAR SPINE SURGERY  08/12/2022   r foot surgery  1995   REVERSE SHOULDER ARTHROPLASTY Left 10/23/2018   Procedure: REVERSE TOTAL SHOULDER ARTHROPLASTY;  Surgeon: Beverely Low, MD;  [facility];  Service: Orthopedics;  Laterality: Left;  interscalene block   REVERSE SHOULDER ARTHROPLASTY Right 04/20/2021   Procedure: REVERSE SHOULDER ARTHROPLASTY;  Surgeon: Beverely Low, MD;  [facility];  Service: Orthopedics;  Laterality: Right;  with ISB   right hand surgery  2001   RIGHT HEART CATH AND CORONARY ANGIOGRAPHY N/A 10/24/2021   Procedure: RIGHT HEART CATH AND CORONARY ANGIOGRAPHY;  Surgeon: Tonny Bollman, MD;  Location: [facility] INVASIVE CV LAB;  Service: Cardiovascular;  Laterality: N/A;   right index finger surgery  2009   SHOULDER OPEN ROTATOR CUFF REPAIR  11/21/2011   Procedure: ROTATOR CUFF REPAIR SHOULDER OPEN;  Surgeon: Javier Docker, MD;  [facility];  Service: Orthopedics;  Laterality: Right;  with subacromial  activity, Neuromuscular re-education, Balance training, Gait training, Patient/Family education, Self Care, Joint mobilization, Stair training, Dry Needling, Electrical stimulation, Cryotherapy, Moist heat, Ionotophoresis 4mg /ml Dexamethasone, and Manual therapy.  PLAN FOR NEXT SESSION: add exercises for strength and balance as tolerated  Encounter Date: 11/28/2022   Suanne Marker, PTA 11/28/2022, 10:57 AM  [facility] [address] [facility]. [place], [place], [address] Phone: [phone]   Fax:  [phone] [address] [facility] [address] [place], [place], [address] Phone: [phone]   Fax:  [phone]  Patient  Details  Name: Cheryl Cooper MRN: [id number] Date of Birth: [birth year] Referring Provider:  Mliss Sax,*

## 2022-12-02 ENCOUNTER — Other Ambulatory Visit: Payer: Self-pay | Admitting: Family Medicine

## 2022-12-02 ENCOUNTER — Encounter: Payer: Self-pay | Admitting: Family Medicine

## 2022-12-02 ENCOUNTER — Ambulatory Visit: Payer: Medicare PPO | Admitting: Family Medicine

## 2022-12-02 VITALS — BP 104/68 | HR 59 | Temp 97.7°F | Ht 63.0 in | Wt 138.4 lb

## 2022-12-02 DIAGNOSIS — I10 Essential (primary) hypertension: Secondary | ICD-10-CM | POA: Diagnosis not present

## 2022-12-02 DIAGNOSIS — F32A Depression, unspecified: Secondary | ICD-10-CM

## 2022-12-02 DIAGNOSIS — G4700 Insomnia, unspecified: Secondary | ICD-10-CM | POA: Diagnosis not present

## 2022-12-02 NOTE — Progress Notes (Signed)
Established Patient Office Visit   Subjective:  Patient ID: Cheryl Cooper, female    DOB: [birth year]  Age: 82 y.o. MRN: [id number]  Chief Complaint  Patient presents with   Follow-up    6 month f/u. Flu/ covid shot completed at [facility]    HPI Encounter Diagnoses  Name Primary?   Depression, unspecified depression type Yes   Insomnia, unspecified type    Essential hypertension    For follow-up of above.  Continues the citalopram and trazodone for pression insomnia.  Doing well with these medications.  Blood pressure controlled with amlodipine 5 mg.  Physical therapy after recent neck surgery.   Review of Systems  Constitutional: Negative.   HENT: Negative.    Eyes:  Negative for blurred vision, discharge and redness.  Respiratory: Negative.    Cardiovascular: Negative.   Gastrointestinal:  Negative for abdominal pain.  Genitourinary: Negative.   Musculoskeletal:  Positive for neck pain. Negative for myalgias.  Skin:  Negative for rash.  Neurological:  Negative for tingling, loss of consciousness and weakness.  Endo/Heme/Allergies:  Negative for polydipsia.     Current Outpatient Medications:    amLODipine (NORVASC) 5 MG tablet, TAKE 1 AND 1/2 TABLETS BY MOUTH DAILY, Disp: 135 tablet, Rfl: 2   amoxicillin (AMOXIL) 500 MG tablet, Take 2,000 mg by mouth as directed. Take 4 capsules (2000 mg) by mouth 1 hour prior to dental appointments, Disp: , Rfl:    aspirin 81 MG chewable tablet, Chew 1 tablet (81 mg total) by mouth daily., Disp: , Rfl:    Calcium Carbonate-Vit D-Min (CALCIUM 600+D PLUS MINERALS PO), Take 2 tablets by mouth daily., Disp: , Rfl:    escitalopram (LEXAPRO) 20 MG tablet, Take 1 tablet (20 mg total) by mouth daily., Disp: 90 tablet, Rfl: 2   esomeprazole (NEXIUM) 40 MG capsule, TAKE 1 CAPSULE BY MOUTH TWICE A DAY BEFORE A MEAL, Disp: 180 capsule, Rfl: 0   estradiol (ESTRACE VAGINAL) 0.1 MG/GM vaginal cream, Place 1 g vaginally 2 (two) times a week.  Initial dose: Apply externally nightly x 2 weeks, then twice weekly., Disp: 42.5 g, Rfl: 0   ezetimibe (ZETIA) 10 MG tablet, Take 1 tablet (10 mg total) by mouth daily., Disp: 90 tablet, Rfl: 2   glucose blood test strip, daily., Disp: , Rfl:    levothyroxine (SYNTHROID) 75 MCG tablet, TAKE 1 TABLET BY MOUTH DAILY WITH BREAKFAST, Disp: 90 tablet, Rfl: 2   methocarbamol (ROBAXIN) 500 MG tablet, Take 1 tablet (500 mg total) by mouth every 6 (six) hours as needed for muscle spasms., Disp: 30 tablet, Rfl: 1   Multiple Vitamins-Minerals (WOMENS MULTI GUMMIES PO), Take 2 tablets by mouth daily., Disp: , Rfl:    oxybutynin (DITROPAN XL) 15 MG 24 hr tablet, TAKE 1 TABLET BY MOUTH AT BEDTIME, Disp: 90 tablet, Rfl: 1   oxyCODONE 10 MG TABS, Take 1 tablet (10 mg total) by mouth every 3 (three) hours as needed for severe pain ((score 7 to 10))., Disp: 30 tablet, Rfl: 0   Probiotic Product (PROBIOTIC PO), Take 2 capsules by mouth daily., Disp: , Rfl:    rosuvastatin (CRESTOR) 10 MG tablet, TAKE ONE TABLET BY MOUTH DAILY, Disp: 90 tablet, Rfl: 2   traZODone (DESYREL) 50 MG tablet, Take 0.5 tablets (25 mg total) by mouth at bedtime., Disp: 45 tablet, Rfl: 1   Objective:     BP 104/68 (BP Location: Left Arm, Patient Position: Sitting, Cuff Size: Normal)   Pulse (!) 59  Temp 97.7 F (36.5 C) (Temporal)   Ht 5\' 3"  (1.6 m)   Wt 138 lb 6.4 oz (62.8 kg)   SpO2 94%   BMI 24.52 kg/m    Physical Exam Constitutional:      General: She is not in acute distress.    Appearance: Normal appearance. She is not ill-appearing, toxic-appearing or diaphoretic.  HENT:     Head: Normocephalic and atraumatic.     Right Ear: External ear normal.     Left Ear: External ear normal.     Mouth/Throat:     Mouth: Mucous membranes are moist.     Pharynx: Oropharynx is clear. No oropharyngeal exudate or posterior oropharyngeal erythema.  Eyes:     General: No scleral icterus.       Right eye: No discharge.        Left  eye: No discharge.     Extraocular Movements: Extraocular movements intact.     Conjunctiva/sclera: Conjunctivae normal.     Pupils: Pupils are equal, round, and reactive to light.  Cardiovascular:     Rate and Rhythm: Normal rate and regular rhythm.     Heart sounds: Murmur heard.  Pulmonary:     Effort: Pulmonary effort is normal. No respiratory distress.     Breath sounds: Normal breath sounds.  Musculoskeletal:     Cervical back: No rigidity or tenderness.  Skin:    General: Skin is warm and dry.  Neurological:     Mental Status: She is alert and oriented to person, place, and time.  Psychiatric:        Mood and Affect: Mood normal.        Behavior: Behavior normal.      No results found for any visits on 12/02/22.    The ASCVD Risk score (Arnett DK, et al., 2019) failed to calculate for the following reasons:   The 2019 ASCVD risk score is only valid for ages 57 to 51   The patient has a prior MI or stroke diagnosis    Assessment & Plan:   Depression, unspecified depression type  Insomnia, unspecified type  Essential hypertension    Return in about 6 months (around 06/02/2023).  Continue all medications as above.  Continue follow-up with endocrinology for diabetes and hypothyroidism.  Mliss Sax, MD

## 2022-12-04 ENCOUNTER — Ambulatory Visit: Payer: Medicare PPO | Admitting: Physical Therapy

## 2022-12-04 ENCOUNTER — Encounter: Payer: Self-pay | Admitting: Physical Therapy

## 2022-12-04 DIAGNOSIS — R2681 Unsteadiness on feet: Secondary | ICD-10-CM

## 2022-12-04 DIAGNOSIS — R293 Abnormal posture: Secondary | ICD-10-CM

## 2022-12-04 DIAGNOSIS — R262 Difficulty in walking, not elsewhere classified: Secondary | ICD-10-CM

## 2022-12-04 DIAGNOSIS — M4310 Spondylolisthesis, site unspecified: Secondary | ICD-10-CM | POA: Diagnosis not present

## 2022-12-04 DIAGNOSIS — M6281 Muscle weakness (generalized): Secondary | ICD-10-CM | POA: Diagnosis not present

## 2022-12-04 NOTE — Therapy (Signed)
no AD 13.7 sec  3.  Increase B hip strength to at least 4/5 Baseline:  Goal status: INITIAL  4.  Patient will ambulate at least 400' on level or slightly unlevel surfaces without AD, I, minimized gait deviations and no unsteadiness. Baseline: In clinic, max 100', unsteady, multiple gait deviations noted. Goal status: INITIAL  5.  Patient will report at least 50% improvement in her pain symptoms with daily activities. Baseline: 6/10 Goal status: INITIAL  PLAN:  PT FREQUENCY: 1-2x/week  PT DURATION: 10 weeks  PLANNED  INTERVENTIONS: Therapeutic exercises, Therapeutic activity, Neuromuscular re-education, Balance training, Gait training, Patient/Family education, Self Care, Joint mobilization, Stair training, Dry Needling, Electrical stimulation, Cryotherapy, Moist heat, Ionotophoresis 4mg /ml Dexamethasone, and Manual therapy.  PLAN FOR NEXT SESSION: add exercises for strength and balance as tolerated  Encounter Date: 12/04/2022   Iona Beard, PT 12/04/2022, 10:56 AM  [facility] [address] [facility] [place], [place], [address] Phone: [phone]   Fax:  [phone] [address] [place], [place], [address] Phone: [phone]   Fax:  [phone]  Patient Details  Name: Cheryl Cooper MRN: [id number] Date of Birth: [birth year] Referring Provider:  Mliss Sax,*  OUTPATIENT PHYSICAL THERAPY THORACOLUMBAR    Patient Name: Cheryl Cooper MRN: [id number] DOB:03/22/1940, 82 y.o., female Today's Date: 12/04/2022  END OF SESSION:  PT End of Session - 12/04/22 1021     Visit Number 6    Date for PT Re-Evaluation 01/21/23    PT Start Time 1015    PT Stop Time 1055    PT Time Calculation (min) 40 min    Activity Tolerance Patient tolerated treatment well    Behavior During Therapy WFL for tasks assessed/performed             Past Medical History:  Diagnosis Date   Anxiety    Arthritis    Carpal tunnel syndrome    Deafness in left ear    Depression    Diabetes mellitus    diet controlled   Diverticulitis    GERD (gastroesophageal reflux disease)    Hypercholesteremia    Hypertension    Hypothyroid    Memory loss    reports d/t concussion   PONV (postoperative nausea and vomiting) yrs ago, none recent   Post concussion syndrome    S/P TAVR (transcatheter aortic valve replacement) 11/13/2021   s/p TAVR with a 23 mm Edwards S3UR via the TF approach by Dr. Excell Seltzer and Dr. Leafy Ro   Severe aortic stenosis    Stroke [facility])    Past Surgical History:  Procedure Laterality Date   ABDOMINAL HYSTERECTOMY  1986   ANTERIOR LATERAL LUMBAR FUSION WITH PERCUTANEOUS SCREW 1 LEVEL Left 01/25/2022   Procedure: ANTERIOR LATERAL LUMBAR FUSION WITH PERCUTANEOUS SCREW LUMBAR THREE-FOUR;  Surgeon: Julio Sicks, MD;  Location: [facility] OR;  Service: Neurosurgery;  Laterality: Left;   BACK SURGERY  2010   lower   BIOPSY  01/12/2018   Procedure: BIOPSY;  Surgeon: Meridee Score Netty Starring., MD;  Location: [facility]COPY;  Service: Gastroenterology;;   CARDIAC CATHETERIZATION     CARPAL TUNNEL RELEASE Right 2023   CHOLECYSTECTOMY     ESOPHAGOGASTRODUODENOSCOPY (EGD) WITH PROPOFOL N/A 01/12/2018   Procedure: ESOPHAGOGASTRODUODENOSCOPY (EGD) WITH PROPOFOL;  Surgeon: Lemar Lofty., MD;  Location: [facility];  Service: Gastroenterology;  Laterality: N/A;    EYE SURGERY Right    cataract   INTRAOPERATIVE TRANSTHORACIC ECHOCARDIOGRAM N/A 11/13/2021   Procedure: INTRAOPERATIVE TRANSTHORACIC ECHOCARDIOGRAM;  Surgeon: Tonny Bollman, MD;  Location: [facility] OR;  Service: Open Heart Surgery;  Laterality: N/A;   left elobow surgery  2003   left rotator cuff  2001   LUMBAR LAMINECTOMY/DECOMPRESSION MICRODISCECTOMY Left 12/23/2017   Procedure: Laminectomy for facet/synovial cyst - left - Lumbar three-Lumbar four;  Surgeon: Julio Sicks, MD;  Location: [facility] OR;  Service: Neurosurgery;  Laterality: Left;   LUMBAR SPINE SURGERY  08/12/2022   r foot surgery  1995   REVERSE SHOULDER ARTHROPLASTY Left 10/23/2018   Procedure: REVERSE TOTAL SHOULDER ARTHROPLASTY;  Surgeon: Beverely Low, MD;  Location: [facility] ORS;  Service: Orthopedics;  Laterality: Left;  interscalene block   REVERSE SHOULDER ARTHROPLASTY Right 04/20/2021   Procedure: REVERSE SHOULDER ARTHROPLASTY;  Surgeon: Beverely Low, MD;  Location: [facility]S;  Service: Orthopedics;  Laterality: Right;  with ISB   right hand surgery  2001   RIGHT HEART CATH AND CORONARY ANGIOGRAPHY N/A 10/24/2021   Procedure: RIGHT HEART CATH AND CORONARY ANGIOGRAPHY;  Surgeon: Tonny Bollman, MD;  Location: [facility] INVASIVE CV LAB;  Service: Cardiovascular;  Laterality: N/A;   right index finger surgery  2009   SHOULDER OPEN ROTATOR CUFF REPAIR  11/21/2011   Procedure: ROTATOR CUFF REPAIR SHOULDER  no AD 13.7 sec  3.  Increase B hip strength to at least 4/5 Baseline:  Goal status: INITIAL  4.  Patient will ambulate at least 400' on level or slightly unlevel surfaces without AD, I, minimized gait deviations and no unsteadiness. Baseline: In clinic, max 100', unsteady, multiple gait deviations noted. Goal status: INITIAL  5.  Patient will report at least 50% improvement in her pain symptoms with daily activities. Baseline: 6/10 Goal status: INITIAL  PLAN:  PT FREQUENCY: 1-2x/week  PT DURATION: 10 weeks  PLANNED  INTERVENTIONS: Therapeutic exercises, Therapeutic activity, Neuromuscular re-education, Balance training, Gait training, Patient/Family education, Self Care, Joint mobilization, Stair training, Dry Needling, Electrical stimulation, Cryotherapy, Moist heat, Ionotophoresis 4mg /ml Dexamethasone, and Manual therapy.  PLAN FOR NEXT SESSION: add exercises for strength and balance as tolerated  Encounter Date: 12/04/2022   Iona Beard, PT 12/04/2022, 10:56 AM  [facility] [address] [facility] [place], [place], [address] Phone: [phone]   Fax:  [phone] [address] [place], [place], [address] Phone: [phone]   Fax:  [phone]  Patient Details  Name: Cheryl Cooper MRN: [id number] Date of Birth: [birth year] Referring Provider:  Mliss Sax,*  OUTPATIENT PHYSICAL THERAPY THORACOLUMBAR    Patient Name: Cheryl Cooper MRN: [id number] DOB:03/22/1940, 82 y.o., female Today's Date: 12/04/2022  END OF SESSION:  PT End of Session - 12/04/22 1021     Visit Number 6    Date for PT Re-Evaluation 01/21/23    PT Start Time 1015    PT Stop Time 1055    PT Time Calculation (min) 40 min    Activity Tolerance Patient tolerated treatment well    Behavior During Therapy WFL for tasks assessed/performed             Past Medical History:  Diagnosis Date   Anxiety    Arthritis    Carpal tunnel syndrome    Deafness in left ear    Depression    Diabetes mellitus    diet controlled   Diverticulitis    GERD (gastroesophageal reflux disease)    Hypercholesteremia    Hypertension    Hypothyroid    Memory loss    reports d/t concussion   PONV (postoperative nausea and vomiting) yrs ago, none recent   Post concussion syndrome    S/P TAVR (transcatheter aortic valve replacement) 11/13/2021   s/p TAVR with a 23 mm Edwards S3UR via the TF approach by Dr. Excell Seltzer and Dr. Leafy Ro   Severe aortic stenosis    Stroke [facility])    Past Surgical History:  Procedure Laterality Date   ABDOMINAL HYSTERECTOMY  1986   ANTERIOR LATERAL LUMBAR FUSION WITH PERCUTANEOUS SCREW 1 LEVEL Left 01/25/2022   Procedure: ANTERIOR LATERAL LUMBAR FUSION WITH PERCUTANEOUS SCREW LUMBAR THREE-FOUR;  Surgeon: Julio Sicks, MD;  Location: [facility] OR;  Service: Neurosurgery;  Laterality: Left;   BACK SURGERY  2010   lower   BIOPSY  01/12/2018   Procedure: BIOPSY;  Surgeon: Meridee Score Netty Starring., MD;  Location: [facility];  Service: Gastroenterology;;   CARDIAC CATHETERIZATION     CARPAL TUNNEL RELEASE Right 2023   CHOLECYSTECTOMY     ESOPHAGOGASTRODUODENOSCOPY (EGD) WITH PROPOFOL N/A 01/12/2018   Procedure: ESOPHAGOGASTRODUODENOSCOPY (EGD) WITH PROPOFOL;  Surgeon: Lemar Lofty., MD;  Location: [facility];  Service: Gastroenterology;  Laterality: N/A;    EYE SURGERY Right    cataract   INTRAOPERATIVE TRANSTHORACIC ECHOCARDIOGRAM N/A 11/13/2021   Procedure: INTRAOPERATIVE TRANSTHORACIC ECHOCARDIOGRAM;  Surgeon: Tonny Bollman, MD;  Location: [facility] OR;  Service: Open Heart Surgery;  Laterality: N/A;   left elobow surgery  2003   left rotator cuff  2001   LUMBAR LAMINECTOMY/DECOMPRESSION MICRODISCECTOMY Left 12/23/2017   Procedure: Laminectomy for facet/synovial cyst - left - Lumbar three-Lumbar four;  Surgeon: Julio Sicks, MD;  Location: [facility] OR;  Service: Neurosurgery;  Laterality: Left;   LUMBAR SPINE SURGERY  08/12/2022   r foot surgery  1995   REVERSE SHOULDER ARTHROPLASTY Left 10/23/2018   Procedure: REVERSE TOTAL SHOULDER ARTHROPLASTY;  Surgeon: Beverely Low, MD;  Location: [facility]S;  Service: Orthopedics;  Laterality: Left;  interscalene block   REVERSE SHOULDER ARTHROPLASTY Right 04/20/2021   Procedure: REVERSE SHOULDER ARTHROPLASTY;  Surgeon: Beverely Low, MD;  Location: [facility];  Service: Orthopedics;  Laterality: Right;  with ISB   right hand surgery  2001   RIGHT HEART CATH AND CORONARY ANGIOGRAPHY N/A 10/24/2021   Procedure: RIGHT HEART CATH AND CORONARY ANGIOGRAPHY;  Surgeon: Tonny Bollman, MD;  Location: [facility] INVASIVE CV LAB;  Service: Cardiovascular;  Laterality: N/A;   right index finger surgery  2009   SHOULDER OPEN ROTATOR CUFF REPAIR  11/21/2011   Procedure: ROTATOR CUFF REPAIR SHOULDER  no AD 13.7 sec  3.  Increase B hip strength to at least 4/5 Baseline:  Goal status: INITIAL  4.  Patient will ambulate at least 400' on level or slightly unlevel surfaces without AD, I, minimized gait deviations and no unsteadiness. Baseline: In clinic, max 100', unsteady, multiple gait deviations noted. Goal status: INITIAL  5.  Patient will report at least 50% improvement in her pain symptoms with daily activities. Baseline: 6/10 Goal status: INITIAL  PLAN:  PT FREQUENCY: 1-2x/week  PT DURATION: 10 weeks  PLANNED  INTERVENTIONS: Therapeutic exercises, Therapeutic activity, Neuromuscular re-education, Balance training, Gait training, Patient/Family education, Self Care, Joint mobilization, Stair training, Dry Needling, Electrical stimulation, Cryotherapy, Moist heat, Ionotophoresis 4mg /ml Dexamethasone, and Manual therapy.  PLAN FOR NEXT SESSION: add exercises for strength and balance as tolerated  Encounter Date: 12/04/2022   Iona Beard, PT 12/04/2022, 10:56 AM  [facility] [address] [facility] [place], [place], [address] Phone: [phone]   Fax:  [phone] [address] [place], [place], [address] Phone: [phone]   Fax:  [phone]  Patient Details  Name: Cheryl Cooper MRN: [id number] Date of Birth: [birth year] Referring Provider:  Mliss Sax,*

## 2022-12-09 ENCOUNTER — Other Ambulatory Visit: Payer: Self-pay | Admitting: Nurse Practitioner

## 2022-12-09 DIAGNOSIS — N952 Postmenopausal atrophic vaginitis: Secondary | ICD-10-CM

## 2022-12-09 DIAGNOSIS — N9489 Other specified conditions associated with female genital organs and menstrual cycle: Secondary | ICD-10-CM

## 2022-12-10 ENCOUNTER — Ambulatory Visit: Payer: Medicare PPO | Admitting: Physical Therapy

## 2022-12-10 ENCOUNTER — Other Ambulatory Visit: Payer: Self-pay | Admitting: Gastroenterology

## 2022-12-10 DIAGNOSIS — R293 Abnormal posture: Secondary | ICD-10-CM

## 2022-12-10 DIAGNOSIS — R2681 Unsteadiness on feet: Secondary | ICD-10-CM

## 2022-12-10 DIAGNOSIS — R262 Difficulty in walking, not elsewhere classified: Secondary | ICD-10-CM

## 2022-12-10 DIAGNOSIS — M4310 Spondylolisthesis, site unspecified: Secondary | ICD-10-CM | POA: Diagnosis not present

## 2022-12-10 DIAGNOSIS — M6281 Muscle weakness (generalized): Secondary | ICD-10-CM

## 2022-12-10 DIAGNOSIS — K7460 Unspecified cirrhosis of liver: Secondary | ICD-10-CM

## 2022-12-10 NOTE — Therapy (Signed)
OUTPATIENT PHYSICAL THERAPY THORACOLUMBAR    Patient Name: Cheryl Cooper MRN: [id number] DOB:11-Dec-1940, 82 y.o., female Today's Date: 12/10/2022  END OF SESSION:  PT End of Session - 12/10/22 1531     Visit Number 7    Date for PT Re-Evaluation 01/21/23    PT Start Time 1530    PT Stop Time 1615    PT Time Calculation (min) 45 min             Past Medical History:  Diagnosis Date   Anxiety    Arthritis    Carpal tunnel syndrome    Deafness in left ear    Depression    Diabetes mellitus    diet controlled   Diverticulitis    GERD (gastroesophageal reflux disease)    Hypercholesteremia    Hypertension    Hypothyroid    Memory loss    reports d/t concussion   PONV (postoperative nausea and vomiting) yrs ago, none recent   Post concussion syndrome    S/P TAVR (transcatheter aortic valve replacement) 11/13/2021   s/p TAVR with a 23 mm Edwards S3UR via the TF approach by Dr. Excell Seltzer and Dr. Leafy Ro   Severe aortic stenosis    Stroke [facility])    Past Surgical History:  Procedure Laterality Date   ABDOMINAL HYSTERECTOMY  1986   ANTERIOR LATERAL LUMBAR FUSION WITH PERCUTANEOUS SCREW 1 LEVEL Left 01/25/2022   Procedure: ANTERIOR LATERAL LUMBAR FUSION WITH PERCUTANEOUS SCREW LUMBAR THREE-FOUR;  Surgeon: Julio Sicks, MD;  Location: [facility] OR;  Service: Neurosurgery;  Laterality: Left;   BACK SURGERY  2010   lower   BIOPSY  01/12/2018   Procedure: BIOPSY;  Surgeon: Meridee Score Netty Starring., MD;  Location: [facility];  Service: Gastroenterology;;   CARDIAC CATHETERIZATION     CARPAL TUNNEL RELEASE Right 2023   CHOLECYSTECTOMY     ESOPHAGOGASTRODUODENOSCOPY (EGD) WITH PROPOFOL N/A 01/12/2018   Procedure: ESOPHAGOGASTRODUODENOSCOPY (EGD) WITH PROPOFOL;  Surgeon: Lemar Lofty., MD;  Location: [facility];  Service: Gastroenterology;  Laterality: N/A;   EYE SURGERY Right    cataract   INTRAOPERATIVE TRANSTHORACIC ECHOCARDIOGRAM N/A 11/13/2021   Procedure:  INTRAOPERATIVE TRANSTHORACIC ECHOCARDIOGRAM;  Surgeon: Tonny Bollman, MD;  Location: [facility] OR;  Service: Open Heart Surgery;  Laterality: N/A;   left elobow surgery  2003   left rotator cuff  2001   LUMBAR LAMINECTOMY/DECOMPRESSION MICRODISCECTOMY Left 12/23/2017   Procedure: Laminectomy for facet/synovial cyst - left - Lumbar three-Lumbar four;  Surgeon: Julio Sicks, MD;  Location: [facility] OR;  Service: Neurosurgery;  Laterality: Left;   LUMBAR SPINE SURGERY  08/12/2022   r foot surgery  1995   REVERSE SHOULDER ARTHROPLASTY Left 10/23/2018   Procedure: REVERSE TOTAL SHOULDER ARTHROPLASTY;  Surgeon: Beverely Low, MD;  Location: [facility];  Service: Orthopedics;  Laterality: Left;  interscalene block   REVERSE SHOULDER ARTHROPLASTY Right 04/20/2021   Procedure: REVERSE SHOULDER ARTHROPLASTY;  Surgeon: Beverely Low, MD;  Location: [facility];  Service: Orthopedics;  Laterality: Right;  with ISB   right hand surgery  2001   RIGHT HEART CATH AND CORONARY ANGIOGRAPHY N/A 10/24/2021   Procedure: RIGHT HEART CATH AND CORONARY ANGIOGRAPHY;  Surgeon: Tonny Bollman, MD;  Location: [facility] INVASIVE CV LAB;  Service: Cardiovascular;  Laterality: N/A;   right index finger surgery  2009   SHOULDER OPEN ROTATOR CUFF REPAIR  11/21/2011   Procedure: ROTATOR CUFF REPAIR SHOULDER OPEN;  Surgeon: Javier Docker, MD;  Location: [facility];  Service: Orthopedics;  Laterality: Right;  with  subacromial decompression   SHOULDER OPEN ROTATOR CUFF REPAIR Right 05/09/2016   Procedure: Right shoulder mini open revision rotator cuff repair, subacromial decompression;  Surgeon: Jene Every, MD;  Location: [facility];  Service: Orthopedics;  Laterality: Right;   TOTAL HIP ARTHROPLASTY Right 04/24/2017   Procedure: RIGHT TOTAL HIP ARTHROPLASTY ANTERIOR APPROACH;  Surgeon: Samson Frederic, MD;  Location: [facility];  Service: Orthopedics;  Laterality: Right;  Needs RNFA   TRANSCATHETER AORTIC VALVE REPLACEMENT, TRANSFEMORAL N/A 11/13/2021    Procedure: Transcatheter Aortic Valve Replacement, Transfemoral Edwards 23 MM SAPIEN 3 Ultra;  Surgeon: Tonny Bollman, MD;  Location: [facility] OR;  Service: Open Heart Surgery;  Laterality: N/A;  Transfemoral approach   Patient Active Problem List   Diagnosis Date Noted   Insomnia 12/02/2022   Essential hypertension 12/02/2022   Lumbar adjacent segment disease with spondylolisthesis 08/13/2022   Degenerative spondylolisthesis 01/25/2022   S/P TAVR (transcatheter aortic valve replacement) 11/13/2021   Severe aortic stenosis    Urine frequency 07/26/2021   Depression 06/25/2021   B12 deficiency 06/25/2021   S/P shoulder replacement, right 04/20/2021   Slow transit constipation 03/20/2021   Anemia 03/20/2021   Tick bite of abdomen 07/11/2020   Hyperlipidemia 06/22/2020   Type 2 diabetes, diet controlled (HCC) 06/22/2020   Chronic back pain 06/22/2020   Transaminitis 06/22/2020   PUD (peptic ulcer disease) 03/21/2018   Synovial cyst of lumbar facet joint 12/23/2017   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   Rotator cuff tear, right 11/21/2011   PCP: Mliss Sax, MD  REFERRING PROVIDER: Julio Sicks, MD  REFERRING DIAG: M43.10 (ICD-10-CM) - Spondylolisthesis, site unspecified   Rationale for Evaluation and Treatment: Rehabilitation  THERAPY DIAG:  Abnormal posture  Difficulty in walking, not elsewhere classified  Muscle weakness (generalized)  Unsteadiness on feet  ONSET DATE: 10/25/22  SUBJECTIVE:                                                                                                                                                                                           SUBJECTIVE STATEMENT: Patient reports no new issues. Back still hurts the same as always  Patient reports that she had lumbar surgery 07/25/22. Her Dr has cleared her for therapy. She has since had a fall, landing face first on her outside step on 11/04/22. She was cleared by Dr Jordan Likes to proceed with  PT. She arrives wearing a soft collar.  PERTINENT HISTORY:  TAVR 10/24/21, EF 70%, B Rot Cuff surgery, back surgery in 12/23 Per referring physician:  L3-4 decompression and fusion 07/25/22. Sustained a fall on 11/04/22  ER report: 82 year old female presenting for mechanical fall.  She has some facial bruising as well as small lip laceration on the lower lip. CT scans concerning for small subarachnoid hemorrhage. Discussed with neurosurgery who recommends no observation or repeat imaging, just outpatient follow-up. She remained stable here, able without difficulty. She will stop aspirin for a week and then follow-up with neurosurgery. Strict return precautions given. Discharged in stable condition.   PAIN:  Are you having pain? Yes: NPRS scale: 6/10 Pain location: low back Pain description: aching, stays in the back Aggravating factors: First thing in the morning, accumulated mobility towards the afternoon Relieving factors: Pain meds, lying down  PRECAUTIONS: Back and Fall  RED FLAGS: None   WEIGHT BEARING RESTRICTIONS: No  FALLS:  Has patient fallen in last 6 months? Yes. Number of falls 2 and a couple near falls.  LIVING ENVIRONMENT: Lives with: lives with their family and lives with their spouse Lives in: House/apartment Stairs: Yes: External: 2 steps; on right going up Has following equipment at home: Retail banker - 4 wheeled, grab bars in shower  OCCUPATION: N/A  PLOF: Independent  PATIENT GOALS: Patient would like to return to her PLOF, walk without an AD.  NEXT MD VISIT: Pool-12/26/22  OBJECTIVE:   DIAGNOSTIC FINDINGS:  CT cervical spine 11/04/22 IMPRESSION: 1. Trace acute subarachnoid hemorrhage along the bilateral anterior cingulate sulci. 2. No acute facial fracture. 3. No acute cervical spine fracture or traumatic malalignment  COGNITION: Overall cognitive status: Within functional limits for tasks assessed     SENSATION: WFL  MUSCLE  LENGTH: Hamstrings: Right 65 deg; Left 60 deg Thomas test: WNL B  POSTURE: rounded shoulders, forward head, increased thoracic kyphosis, and pelvis rotated post on L  PALPATION: TTP lower lumbar paraspinals on R, R gluts, piriformis and prox ITB  LUMBAR ROM:   AROM eval  Flexion Mid shin, pain in L leg  Extension deferred  Right lateral flexion knee  Left lateral flexion knee  Right rotation deferred  Left rotation deferred   (Blank rows = not tested)  LOWER EXTREMITY ROM:  B hips with decreased PROM in all planes, mild   LOWER EXTREMITY MMT:    MMT Right eval Left eval  Hip flexion 4 4  Hip extension 4- 3+  Hip abduction 4- 4-  Hip adduction    Hip internal rotation    Hip external rotation    Knee flexion 5 5  Knee extension 5 5  Ankle dorsiflexion    Ankle plantarflexion    Ankle inversion    Ankle eversion     (Blank rows = not tested)  LUMBAR SPECIAL TESTS:  Straight leg raise test: positive on R and Slump test: positive on L  FUNCTIONAL TESTS:  5 times sit to stand: 11.9, stable Timed up and go (TUG): 19.23, unsteady, especially during turns, no AD  GAIT: Distance walked: In clinic distances Assistive device utilized: None Level of assistance: Modified independence Comments: Patient was slow and unsteady during gait with multiple deviations including increased lateral sway, increased stance time B, shuffling. She reports that she uses a Rollator at home, but couldn't bring it. She has a cane in the car, but is unsure how to walk with it.  TODAY'S TREATMENT:  DATE:   12/10/22 Nustep L 5 TUG and assessed goals HS curl 25# 2 sets 10. SL 15# 10 x each LAQ 10# 2 sets 10, 5# 10 x SL Black tband trunk flex and ext 2 sets 10 Seated row 20# 2 sets 10 20# resisted gait fwd and back 5 x each,3 x each side STS wt ball press 10 x 2  sets Amb outside HHA working increased stride and navigation curbs and barriers with cuig to get closer to curb before stepping up  12/04/22 NuStep L5 x 6 minutes Seated Fitter press, 2 blue bands, 3 x 10 with each leg, emphasizing power. B sidestepping with G tband resistance at knees, 2 x 10 Side stepping on Airex plank in parallel bars, x 5 each way, had difficulty performing without BUE support Standing step back and turn to reach behind in parallel bars, alternating to each side. X 10 reps each side. Upside down BOSU ball in parallel bars, static standing, then lateral and for/back weight shifts with min A for balance. Ambulation with U light HHA, 1 x 120', including sharp turns, avoiding obstacles, stepping backwards.  11/28/22 Nustep L 5 STS on foam mat 2 set 5 Ball toss on foam mat with multi LOB- min-mod A needed 20# resisted gait min A with LOB fwd 5x , backward 5 x , 3 x each side Red tband standing shld ext and row 2 sets 10 Standing red tband hip ext,flex and abd 10 x each 6 inch alt step tap 20 x MIN A with LOB 6 inch step up 10 x BIL with UE support Standing 3# chest press and trunk rotation 10 x each Sitting on dyna disc 4 way 10x then marching,LAQ and hip abd 10 x BIL  11/26/22 NuStep L5 x 6 minutes Supine stretching for hips and low back- piriformis, gluts, SKTC, DKTC Supine pelvic tilts- unable to accurately perform Bridging, clamshells, ball squeeze x 10 each Standing with counter support-mini squats, heel raises, side to side step against G tband x 10 each   11/22/22 Bike level 3 x 5 minutes Nustep level 4 x 5 minutes 2# LAQ 2# marches Ball b/n knees squeeze Red tband clamshells Feet on ball K2C, rotation, small bridges, isometric abs Standing on airex red tband rows and extension 15# leg curls Gentle HS and piriformis stretches  11/14/22 Nustep L 4 5 min Gait training with SPC ( pt states uses walker at home) cuing for sequencing. Instability  noted Pelvic ROM on sit fit 10 x 4 way Yellow tband scap stab on sit fit 4 way 10 x each 2# ankle wt LAQ,hip flex and abd on sit fit 10x 2# ankle wts HHA SL hip flex,ext and abd 10 x each 2# ankle wts HHA marching fwd and back 10 feet 2 x each, then side stepping 10 feet 2 x each HS curl 15# 2 sets 10 LAQ 5# 2 sets 10  11/12/22 Education   PATIENT EDUCATION:  Education details: POC Person educated: Patient Education method: Explanation Education comprehension: verbalized understanding  HOME EXERCISE PROGRAM:  [id number]  ASSESSMENT:  CLINICAL IMPRESSION: Focus session today more on strength as last session was more balance. Goals assessed and documented OBJECTIVE IMPAIRMENTS: Abnormal gait, decreased activity tolerance, decreased balance, decreased coordination, difficulty walking, decreased ROM, decreased strength, improper body mechanics, and pain.   ACTIVITY LIMITATIONS: carrying, lifting, bending, squatting, stairs, and locomotion level  PARTICIPATION LIMITATIONS: meal prep, cleaning, laundry, driving, shopping, and community activity  PERSONAL FACTORS: Past/current  experiences are also affecting patient's functional outcome.   REHAB POTENTIAL: Good  CLINICAL DECISION MAKING: Evolving/moderate complexity  EVALUATION COMPLEXITY: Moderate   GOALS: Goals reviewed with patient? Yes  SHORT TERM GOALS: Target date: 11/28/22  I with initial HEP Baseline: Goal status: 11/26/22 Provided, ongoing  12/10/22/ MET  LONG TERM GOALS: Target date: 01/21/23  I with final HEP Baseline:  Goal status: INITIAL  2.  TUG in < 12 sec Baseline: 19.23, unsteady, no AD Goal status: 11/28/22 no AD 13.7 sec   12/10/22 no AD  11.26 unsteady but no LOB  3.  Increase B hip strength to at least 4/5 Baseline:  Goal status: progressing 12/10/22  4.  Patient will ambulate at least 400' on level or slightly unlevel surfaces without AD, I, minimized gait deviations and no  unsteadiness. Baseline: In clinic, max 100', unsteady, multiple gait deviations noted. Goal status: progressing 12/10/22  5.  Patient will report at least 50% improvement in her pain symptoms with daily activities. Baseline: 6/10 Goal status:  no changes 12/10/22  PLAN:  PT FREQUENCY: 1-2x/week  PT DURATION: 10 weeks  PLANNED INTERVENTIONS: Therapeutic exercises, Therapeutic activity, Neuromuscular re-education, Balance training, Gait training, Patient/Family education, Self Care, Joint mobilization, Stair training, Dry Needling, Electrical stimulation, Cryotherapy, Moist heat, Ionotophoresis 4mg /ml Dexamethasone, and Manual therapy.  PLAN FOR NEXT SESSION: 2 appts next week and then will wait until 11/7 appt with Dr Dutch Quint to see if will continue.  Encounter Date: 12/10/2022   Nicola Girt 12/10/2022, 3:32 PM  [facility] [address] [place], [place], [address] Phone: [phone]   Fax:  [phone] [address] [place], [place], [address] Phone: [phone]   Fax:  [phone]  Patient Details  Name: LADENE GILKESON MRN: [id number] Date of Birth: 03/29/1940 Referring Provider:  [facility] at [facility] [place], [place], [address] Phone: [phone]   Fax:  [phone]

## 2022-12-10 NOTE — Telephone Encounter (Signed)
Medication refill request: estradiol vaginal cream  Last AEX:  09/11/22 Next AEX: not scheduled  Last MMG (if hormonal medication request): 11/17/20 Refill authorized: 42.5g with 0 refills. Note sent to pharmacy for patient to get Mammo

## 2022-12-11 ENCOUNTER — Other Ambulatory Visit: Payer: Self-pay | Admitting: Family Medicine

## 2022-12-11 DIAGNOSIS — E785 Hyperlipidemia, unspecified: Secondary | ICD-10-CM

## 2022-12-12 ENCOUNTER — Ambulatory Visit: Payer: Medicare PPO | Admitting: Physical Therapy

## 2022-12-17 ENCOUNTER — Ambulatory Visit: Payer: Medicare PPO | Admitting: Physical Therapy

## 2022-12-17 DIAGNOSIS — R293 Abnormal posture: Secondary | ICD-10-CM

## 2022-12-17 DIAGNOSIS — M6281 Muscle weakness (generalized): Secondary | ICD-10-CM | POA: Diagnosis not present

## 2022-12-17 DIAGNOSIS — R262 Difficulty in walking, not elsewhere classified: Secondary | ICD-10-CM | POA: Diagnosis not present

## 2022-12-17 DIAGNOSIS — R2681 Unsteadiness on feet: Secondary | ICD-10-CM

## 2022-12-17 DIAGNOSIS — M4310 Spondylolisthesis, site unspecified: Secondary | ICD-10-CM | POA: Diagnosis not present

## 2022-12-17 NOTE — Therapy (Signed)
each HS curl 15# 2 sets 10 LAQ 5# 2 sets 10  11/12/22 Education   PATIENT EDUCATION:  Education details: POC Person educated: Patient Education method: Explanation Education comprehension: verbalized understanding  HOME EXERCISE PROGRAM:  [id number]  ASSESSMENT:  CLINICAL IMPRESSION: continue to focus session on strength,balance and endurance with cuing needed for correct posture and balance assistance.Discussed postural awareness, standing up straight with gait/walking upright as she wants to do better. Pt uses walker at home so educ on raising up a notch to increase upright posture OBJECTIVE IMPAIRMENTS: Abnormal gait, decreased activity tolerance, decreased balance, decreased coordination, difficulty walking, decreased ROM, decreased strength, improper body mechanics, and pain.   ACTIVITY LIMITATIONS: carrying, lifting, bending, squatting, stairs, and locomotion level  PARTICIPATION LIMITATIONS: meal prep, cleaning, laundry, driving, shopping, and community activity  PERSONAL FACTORS: Past/current experiences are also affecting patient's functional  outcome.   REHAB POTENTIAL: Good  CLINICAL DECISION MAKING: Evolving/moderate complexity  EVALUATION COMPLEXITY: Moderate   GOALS: Goals reviewed with patient? Yes  SHORT TERM GOALS: Target date: 11/28/22  I with initial HEP Baseline: Goal status: 11/26/22 Provided, ongoing  12/10/22/ MET  LONG TERM GOALS: Target date: 01/21/23  I with final HEP Baseline:  Goal status: INITIAL  2.  TUG in < 12 sec Baseline: 19.23, unsteady, no AD Goal status: 11/28/22 no AD 13.7 sec   12/10/22 no AD  11.26 unsteady but no LOB  3.  Increase B hip strength to at least 4/5 Baseline:  Goal status: progressing 12/10/22  4.  Patient will ambulate at least 400' on level or slightly unlevel surfaces without AD, I, minimized gait deviations and no unsteadiness. Baseline: In clinic, max 100', unsteady, multiple gait deviations noted. Goal status: progressing 12/10/22  5.  Patient will report at least 50% improvement in her pain symptoms with daily activities. Baseline: 6/10 Goal status:  no changes 12/10/22  PLAN:  PT FREQUENCY: 1-2x/week  PT DURATION: 10 weeks  PLANNED INTERVENTIONS: Therapeutic exercises, Therapeutic activity, Neuromuscular re-education, Balance training, Gait training, Patient/Family education, Self Care, Joint mobilization, Stair training, Dry Needling, Electrical stimulation, Cryotherapy, Moist heat, Ionotophoresis 4mg /ml Dexamethasone, and Manual therapy.  PLAN FOR NEXT SESSION:Dr Dutch Quint 11/7. Pt would like ot go down to 1 x a week starting next week  Encounter Date: 12/17/2022   Suanne Marker, PTA 12/17/2022, 10:15 AM  [facility] [address] [facility] [place], [place], [address] Phone: [phone]   Fax:  [phone] at [facility] [address] [facility] [address] [facility] [address] [place], [place], [address] Phone: [phone]   Fax:  [phone]  Patient Details  Name: Cheryl Cooper MRN: [id number] Date of Birth: [birth year] Referring Provider:  [facility] at [facility]. [place], [place], [address] Phone: [phone]   Fax:  [phone] [facility] at [facility] [address] [place], [place], [address] Phone: [phone]   Fax:  [phone]  each HS curl 15# 2 sets 10 LAQ 5# 2 sets 10  11/12/22 Education   PATIENT EDUCATION:  Education details: POC Person educated: Patient Education method: Explanation Education comprehension: verbalized understanding  HOME EXERCISE PROGRAM:  [id number]  ASSESSMENT:  CLINICAL IMPRESSION: continue to focus session on strength,balance and endurance with cuing needed for correct posture and balance assistance.Discussed postural awareness, standing up straight with gait/walking upright as she wants to do better. Pt uses walker at home so educ on raising up a notch to increase upright posture OBJECTIVE IMPAIRMENTS: Abnormal gait, decreased activity tolerance, decreased balance, decreased coordination, difficulty walking, decreased ROM, decreased strength, improper body mechanics, and pain.   ACTIVITY LIMITATIONS: carrying, lifting, bending, squatting, stairs, and locomotion level  PARTICIPATION LIMITATIONS: meal prep, cleaning, laundry, driving, shopping, and community activity  PERSONAL FACTORS: Past/current experiences are also affecting patient's functional  outcome.   REHAB POTENTIAL: Good  CLINICAL DECISION MAKING: Evolving/moderate complexity  EVALUATION COMPLEXITY: Moderate   GOALS: Goals reviewed with patient? Yes  SHORT TERM GOALS: Target date: 11/28/22  I with initial HEP Baseline: Goal status: 11/26/22 Provided, ongoing  12/10/22/ MET  LONG TERM GOALS: Target date: 01/21/23  I with final HEP Baseline:  Goal status: INITIAL  2.  TUG in < 12 sec Baseline: 19.23, unsteady, no AD Goal status: 11/28/22 no AD 13.7 sec   12/10/22 no AD  11.26 unsteady but no LOB  3.  Increase B hip strength to at least 4/5 Baseline:  Goal status: progressing 12/10/22  4.  Patient will ambulate at least 400' on level or slightly unlevel surfaces without AD, I, minimized gait deviations and no unsteadiness. Baseline: In clinic, max 100', unsteady, multiple gait deviations noted. Goal status: progressing 12/10/22  5.  Patient will report at least 50% improvement in her pain symptoms with daily activities. Baseline: 6/10 Goal status:  no changes 12/10/22  PLAN:  PT FREQUENCY: 1-2x/week  PT DURATION: 10 weeks  PLANNED INTERVENTIONS: Therapeutic exercises, Therapeutic activity, Neuromuscular re-education, Balance training, Gait training, Patient/Family education, Self Care, Joint mobilization, Stair training, Dry Needling, Electrical stimulation, Cryotherapy, Moist heat, Ionotophoresis 4mg /ml Dexamethasone, and Manual therapy.  PLAN FOR NEXT SESSION:Dr Dutch Quint 11/7. Pt would like ot go down to 1 x a week starting next week  Encounter Date: 12/17/2022   Suanne Marker, PTA 12/17/2022, 10:15 AM  [facility] [address] [facility] [place], [place], [address] Phone: [phone]   Fax:  [phone] at [facility] [address] [facility] [address] [place], [place], [address] Phone: [phone]   Fax:  [phone]  Patient Details  Name: Cheryl Cooper MRN: [id number] Date of Birth: [birth year] Referring Provider:  [facility] at [facility]. [place], [place], [address] Phone: [phone]   Fax:  [phone] [facility] at [facility] [address] [place], [place], [address] Phone: [phone]   Fax:  [phone]  OUTPATIENT PHYSICAL THERAPY THORACOLUMBAR    Patient Name: Cheryl Cooper MRN: [id number] DOB:08/20/1940, 82 y.o., female Today's Date: 12/17/2022  END OF SESSION:  PT End of Session - 12/17/22 1014     Visit Number 8    Date for PT Re-Evaluation 01/21/23    PT Start Time 1015    PT Stop Time 1100    PT Time Calculation (min) 45 min             Past Medical History:  Diagnosis Date   Anxiety    Arthritis    Carpal tunnel syndrome    Deafness in left ear    Depression    Diabetes mellitus    diet controlled   Diverticulitis    GERD (gastroesophageal reflux disease)    Hypercholesteremia    Hypertension    Hypothyroid    Memory loss    reports d/t concussion   PONV (postoperative nausea and vomiting) yrs ago, none recent   Post concussion syndrome    S/P TAVR (transcatheter aortic valve replacement) 11/13/2021   s/p TAVR with a 23 mm Edwards S3UR via the TF approach by Dr. Excell Seltzer and Dr. Leafy Ro   Severe aortic stenosis    Stroke [facility])    Past Surgical History:  Procedure Laterality Date   ABDOMINAL HYSTERECTOMY  1986   ANTERIOR LATERAL LUMBAR FUSION WITH PERCUTANEOUS SCREW 1 LEVEL Left 01/25/2022   Procedure: ANTERIOR LATERAL LUMBAR FUSION WITH PERCUTANEOUS SCREW LUMBAR THREE-FOUR;  Surgeon: Julio Sicks, MD;  Location: [facility] OR;  Service: Neurosurgery;  Laterality: Left;   BACK SURGERY  2010   lower   BIOPSY  01/12/2018   Procedure: BIOPSY;  Surgeon: Meridee Score Netty Starring., MD;  Location: [facility] ENDOSCOPY;  Service: Gastroenterology;;   CARDIAC CATHETERIZATION     CARPAL TUNNEL RELEASE Right 2023   CHOLECYSTECTOMY     ESOPHAGOGASTRODUODENOSCOPY (EGD) WITH PROPOFOL N/A 01/12/2018   Procedure: ESOPHAGOGASTRODUODENOSCOPY (EGD) WITH PROPOFOL;  Surgeon: Lemar Lofty., MD;  Location: [facility];  Service: Gastroenterology;  Laterality: N/A;   EYE SURGERY Right    cataract   INTRAOPERATIVE TRANSTHORACIC ECHOCARDIOGRAM N/A 11/13/2021   Procedure:  INTRAOPERATIVE TRANSTHORACIC ECHOCARDIOGRAM;  Surgeon: Tonny Bollman, MD;  Location: [facility] OR;  Service: Open Heart Surgery;  Laterality: N/A;   left elobow surgery  2003   left rotator cuff  2001   LUMBAR LAMINECTOMY/DECOMPRESSION MICRODISCECTOMY Left 12/23/2017   Procedure: Laminectomy for facet/synovial cyst - left - Lumbar three-Lumbar four;  Surgeon: Julio Sicks, MD;  Location: [facility] OR;  Service: Neurosurgery;  Laterality: Left;   LUMBAR SPINE SURGERY  08/12/2022   r foot surgery  1995   REVERSE SHOULDER ARTHROPLASTY Left 10/23/2018   Procedure: REVERSE TOTAL SHOULDER ARTHROPLASTY;  Surgeon: Beverely Low, MD;  Location: [facility] ORS;  Service: Orthopedics;  Laterality: Left;  interscalene block   REVERSE SHOULDER ARTHROPLASTY Right 04/20/2021   Procedure: REVERSE SHOULDER ARTHROPLASTY;  Surgeon: Beverely Low, MD;  Location: [facility];  Service: Orthopedics;  Laterality: Right;  with ISB   right hand surgery  2001   RIGHT HEART CATH AND CORONARY ANGIOGRAPHY N/A 10/24/2021   Procedure: RIGHT HEART CATH AND CORONARY ANGIOGRAPHY;  Surgeon: Tonny Bollman, MD;  Location: [facility] INVASIVE CV LAB;  Service: Cardiovascular;  Laterality: N/A;   right index finger surgery  2009   SHOULDER OPEN ROTATOR CUFF REPAIR  11/21/2011   Procedure: ROTATOR CUFF REPAIR SHOULDER OPEN;  Surgeon: Javier Docker, MD;  Location: [facility] OR[facility];  Service: Orthopedics;  Laterality: Right;  with  each HS curl 15# 2 sets 10 LAQ 5# 2 sets 10  11/12/22 Education   PATIENT EDUCATION:  Education details: POC Person educated: Patient Education method: Explanation Education comprehension: verbalized understanding  HOME EXERCISE PROGRAM:  [id number]  ASSESSMENT:  CLINICAL IMPRESSION: continue to focus session on strength,balance and endurance with cuing needed for correct posture and balance assistance.Discussed postural awareness, standing up straight with gait/walking upright as she wants to do better. Pt uses walker at home so educ on raising up a notch to increase upright posture OBJECTIVE IMPAIRMENTS: Abnormal gait, decreased activity tolerance, decreased balance, decreased coordination, difficulty walking, decreased ROM, decreased strength, improper body mechanics, and pain.   ACTIVITY LIMITATIONS: carrying, lifting, bending, squatting, stairs, and locomotion level  PARTICIPATION LIMITATIONS: meal prep, cleaning, laundry, driving, shopping, and community activity  PERSONAL FACTORS: Past/current experiences are also affecting patient's functional  outcome.   REHAB POTENTIAL: Good  CLINICAL DECISION MAKING: Evolving/moderate complexity  EVALUATION COMPLEXITY: Moderate   GOALS: Goals reviewed with patient? Yes  SHORT TERM GOALS: Target date: 11/28/22  I with initial HEP Baseline: Goal status: 11/26/22 Provided, ongoing  12/10/22/ MET  LONG TERM GOALS: Target date: 01/21/23  I with final HEP Baseline:  Goal status: INITIAL  2.  TUG in < 12 sec Baseline: 19.23, unsteady, no AD Goal status: 11/28/22 no AD 13.7 sec   12/10/22 no AD  11.26 unsteady but no LOB  3.  Increase B hip strength to at least 4/5 Baseline:  Goal status: progressing 12/10/22  4.  Patient will ambulate at least 400' on level or slightly unlevel surfaces without AD, I, minimized gait deviations and no unsteadiness. Baseline: In clinic, max 100', unsteady, multiple gait deviations noted. Goal status: progressing 12/10/22  5.  Patient will report at least 50% improvement in her pain symptoms with daily activities. Baseline: 6/10 Goal status:  no changes 12/10/22  PLAN:  PT FREQUENCY: 1-2x/week  PT DURATION: 10 weeks  PLANNED INTERVENTIONS: Therapeutic exercises, Therapeutic activity, Neuromuscular re-education, Balance training, Gait training, Patient/Family education, Self Care, Joint mobilization, Stair training, Dry Needling, Electrical stimulation, Cryotherapy, Moist heat, Ionotophoresis 4mg /ml Dexamethasone, and Manual therapy.  PLAN FOR NEXT SESSION:Dr Dutch Quint 11/7. Pt would like ot go down to 1 x a week starting next week  Encounter Date: 12/17/2022   Suanne Marker, PTA 12/17/2022, 10:15 AM  [facility] [address] [facility] [place], [place], [address] Phone: [phone]   Fax:  [phone] at [facility] [address] [facility] [address] [place], [place], [address] Phone: [phone]   Fax:  [phone]  Patient Details  Name: Cheryl Cooper MRN: [id number] Date of Birth: [birth year] Referring Provider:  [facility] at [facility]. [place], [place], [address] Phone: [phone]   Fax:  [phone] [facility] at [facility] [address] [place], [place], [address] Phone: [phone]   Fax:  [phone]  each HS curl 15# 2 sets 10 LAQ 5# 2 sets 10  11/12/22 Education   PATIENT EDUCATION:  Education details: POC Person educated: Patient Education method: Explanation Education comprehension: verbalized understanding  HOME EXERCISE PROGRAM:  [id number]  ASSESSMENT:  CLINICAL IMPRESSION: continue to focus session on strength,balance and endurance with cuing needed for correct posture and balance assistance.Discussed postural awareness, standing up straight with gait/walking upright as she wants to do better. Pt uses walker at home so educ on raising up a notch to increase upright posture OBJECTIVE IMPAIRMENTS: Abnormal gait, decreased activity tolerance, decreased balance, decreased coordination, difficulty walking, decreased ROM, decreased strength, improper body mechanics, and pain.   ACTIVITY LIMITATIONS: carrying, lifting, bending, squatting, stairs, and locomotion level  PARTICIPATION LIMITATIONS: meal prep, cleaning, laundry, driving, shopping, and community activity  PERSONAL FACTORS: Past/current experiences are also affecting patient's functional  outcome.   REHAB POTENTIAL: Good  CLINICAL DECISION MAKING: Evolving/moderate complexity  EVALUATION COMPLEXITY: Moderate   GOALS: Goals reviewed with patient? Yes  SHORT TERM GOALS: Target date: 11/28/22  I with initial HEP Baseline: Goal status: 11/26/22 Provided, ongoing  12/10/22/ MET  LONG TERM GOALS: Target date: 01/21/23  I with final HEP Baseline:  Goal status: INITIAL  2.  TUG in < 12 sec Baseline: 19.23, unsteady, no AD Goal status: 11/28/22 no AD 13.7 sec   12/10/22 no AD  11.26 unsteady but no LOB  3.  Increase B hip strength to at least 4/5 Baseline:  Goal status: progressing 12/10/22  4.  Patient will ambulate at least 400' on level or slightly unlevel surfaces without AD, I, minimized gait deviations and no unsteadiness. Baseline: In clinic, max 100', unsteady, multiple gait deviations noted. Goal status: progressing 12/10/22  5.  Patient will report at least 50% improvement in her pain symptoms with daily activities. Baseline: 6/10 Goal status:  no changes 12/10/22  PLAN:  PT FREQUENCY: 1-2x/week  PT DURATION: 10 weeks  PLANNED INTERVENTIONS: Therapeutic exercises, Therapeutic activity, Neuromuscular re-education, Balance training, Gait training, Patient/Family education, Self Care, Joint mobilization, Stair training, Dry Needling, Electrical stimulation, Cryotherapy, Moist heat, Ionotophoresis 4mg /ml Dexamethasone, and Manual therapy.  PLAN FOR NEXT SESSION:Dr Dutch Quint 11/7. Pt would like ot go down to 1 x a week starting next week  Encounter Date: 12/17/2022   Suanne Marker, PTA 12/17/2022, 10:15 AM  [place] [facility] [address] [facility] [place], [place], [address] Phone: [phone]   Fax:  [phone] at [facility] [address] [address] [facility] [address] [place], [place], [address] Phone: [phone]   Fax:  [phone]  Patient Details  Name: Cheryl Cooper MRN: [id number] Date of Birth: [birth year] Referring Provider:  [facility] at [facility]. [place], [place], [address] Phone: [phone]   Fax:  [phone] [facility] at [facility] [address] [place], [place], [address] Phone: [phone]   Fax:  [phone]

## 2022-12-18 ENCOUNTER — Ambulatory Visit
Admission: RE | Admit: 2022-12-18 | Discharge: 2022-12-18 | Disposition: A | Discharge status: Home / Self Care | Payer: Medicare PPO | Source: Ambulatory Visit | Attending: Gastroenterology | Admitting: Gastroenterology

## 2022-12-18 DIAGNOSIS — K7460 Unspecified cirrhosis of liver: Secondary | ICD-10-CM

## 2022-12-19 ENCOUNTER — Ambulatory Visit: Payer: Medicare PPO | Admitting: Physical Therapy

## 2022-12-19 ENCOUNTER — Encounter: Payer: Self-pay | Admitting: Physical Therapy

## 2022-12-19 DIAGNOSIS — R2681 Unsteadiness on feet: Secondary | ICD-10-CM | POA: Diagnosis not present

## 2022-12-19 DIAGNOSIS — M4310 Spondylolisthesis, site unspecified: Secondary | ICD-10-CM

## 2022-12-19 DIAGNOSIS — R262 Difficulty in walking, not elsewhere classified: Secondary | ICD-10-CM | POA: Diagnosis not present

## 2022-12-19 DIAGNOSIS — M6281 Muscle weakness (generalized): Secondary | ICD-10-CM | POA: Diagnosis not present

## 2022-12-19 DIAGNOSIS — R293 Abnormal posture: Secondary | ICD-10-CM

## 2022-12-19 NOTE — Therapy (Signed)
OUTPATIENT PHYSICAL THERAPY THORACOLUMBAR    Patient Name: Cheryl Cooper MRN: [id number] DOB:[birth year], 82 y.o., female Today's Date: 12/19/2022  END OF SESSION:  PT End of Session - 12/19/22 1130     Visit Number 9    Date for PT Re-Evaluation 01/21/23    PT Start Time 1100    PT Stop Time 1140    PT Time Calculation (min) 40 min    Activity Tolerance Patient tolerated treatment well    Behavior During Therapy WFL for tasks assessed/performed            Past Medical History:  Diagnosis Date   Anxiety    Arthritis    Carpal tunnel syndrome    Deafness in left ear    Depression    Diabetes mellitus    diet controlled   Diverticulitis    GERD (gastroesophageal reflux disease)    Hypercholesteremia    Hypertension    Hypothyroid    Memory loss    reports d/t concussion   PONV (postoperative nausea and vomiting) yrs ago, none recent   Post concussion syndrome    S/P TAVR (transcatheter aortic valve replacement) 11/13/2021   s/p TAVR with a 23 mm Edwards S3UR via the TF approach by Dr. Excell Seltzer and Dr. Leafy Ro   Severe aortic stenosis    Stroke [facility])    Past Surgical History:  Procedure Laterality Date   ABDOMINAL HYSTERECTOMY  1986   ANTERIOR LATERAL LUMBAR FUSION WITH PERCUTANEOUS SCREW 1 LEVEL Left 01/25/2022   Procedure: ANTERIOR LATERAL LUMBAR FUSION WITH PERCUTANEOUS SCREW LUMBAR THREE-FOUR;  Surgeon: Julio Sicks, MD;  Location: [facility] OR;  Service: Neurosurgery;  Laterality: Left;   BACK SURGERY  2010   lower   BIOPSY  01/12/2018   Procedure: BIOPSY;  Surgeon: Meridee Score Netty Starring., MD;  Location: [facility];  Service: Gastroenterology;;   CARDIAC CATHETERIZATION     CARPAL TUNNEL RELEASE Right 2023   CHOLECYSTECTOMY     ESOPHAGOGASTRODUODENOSCOPY (EGD) WITH PROPOFOL N/A 01/12/2018   Procedure: ESOPHAGOGASTRODUODENOSCOPY (EGD) WITH PROPOFOL;  Surgeon: Lemar Lofty., MD;  Location: [facility];  Service: Gastroenterology;  Laterality: N/A;    EYE SURGERY Right    cataract   INTRAOPERATIVE TRANSTHORACIC ECHOCARDIOGRAM N/A 11/13/2021   Procedure: INTRAOPERATIVE TRANSTHORACIC ECHOCARDIOGRAM;  Surgeon: Tonny Bollman, MD;  Location: [facility] OR;  Service: Open Heart Surgery;  Laterality: N/A;   left elobow surgery  2003   left rotator cuff  2001   LUMBAR LAMINECTOMY/DECOMPRESSION MICRODISCECTOMY Left 12/23/2017   Procedure: Laminectomy for facet/synovial cyst - left - Lumbar three-Lumbar four;  Surgeon: Julio Sicks, MD;  Location: [facility] OR;  Service: Neurosurgery;  Laterality: Left;   LUMBAR SPINE SURGERY  08/12/2022   r foot surgery  1995   REVERSE SHOULDER ARTHROPLASTY Left 10/23/2018   Procedure: REVERSE TOTAL SHOULDER ARTHROPLASTY;  Surgeon: Beverely Low, MD;  Location: [facility] ORS;  Service: Orthopedics;  Laterality: Left;  interscalene block   REVERSE SHOULDER ARTHROPLASTY Right 04/20/2021   Procedure: REVERSE SHOULDER ARTHROPLASTY;  Surgeon: Beverely Low, MD;  Location: [facility];  Service: Orthopedics;  Laterality: Right;  with ISB   right hand surgery  2001   RIGHT HEART CATH AND CORONARY ANGIOGRAPHY N/A 10/24/2021   Procedure: RIGHT HEART CATH AND CORONARY ANGIOGRAPHY;  Surgeon: Tonny Bollman, MD;  Location: [facility] INVASIVE CV LAB;  Service: Cardiovascular;  Laterality: N/A;   right index finger surgery  2009   SHOULDER OPEN ROTATOR CUFF REPAIR  11/21/2011   Procedure: ROTATOR CUFF REPAIR SHOULDER OPEN;  OUTPATIENT PHYSICAL THERAPY THORACOLUMBAR    Patient Name: Cheryl Cooper MRN: [id number] DOB:[birth year], 82 y.o., female Today's Date: 12/19/2022  END OF SESSION:  PT End of Session - 12/19/22 1130     Visit Number 9    Date for PT Re-Evaluation 01/21/23    PT Start Time 1100    PT Stop Time 1140    PT Time Calculation (min) 40 min    Activity Tolerance Patient tolerated treatment well    Behavior During Therapy WFL for tasks assessed/performed            Past Medical History:  Diagnosis Date   Anxiety    Arthritis    Carpal tunnel syndrome    Deafness in left ear    Depression    Diabetes mellitus    diet controlled   Diverticulitis    GERD (gastroesophageal reflux disease)    Hypercholesteremia    Hypertension    Hypothyroid    Memory loss    reports d/t concussion   PONV (postoperative nausea and vomiting) yrs ago, none recent   Post concussion syndrome    S/P TAVR (transcatheter aortic valve replacement) 11/13/2021   s/p TAVR with a 23 mm Edwards S3UR via the TF approach by Dr. Excell Seltzer and Dr. Leafy Ro   Severe aortic stenosis    Stroke [facility])    Past Surgical History:  Procedure Laterality Date   ABDOMINAL HYSTERECTOMY  1986   ANTERIOR LATERAL LUMBAR FUSION WITH PERCUTANEOUS SCREW 1 LEVEL Left 01/25/2022   Procedure: ANTERIOR LATERAL LUMBAR FUSION WITH PERCUTANEOUS SCREW LUMBAR THREE-FOUR;  Surgeon: Julio Sicks, MD;  [facility] OR;  Service: Neurosurgery;  Laterality: Left;   BACK SURGERY  2010   lower   BIOPSY  01/12/2018   Procedure: BIOPSY;  Surgeon: Meridee Score Netty Starring., MD;  [facility]: [facility];  Service: Gastroenterology;;   CARDIAC CATHETERIZATION     CARPAL TUNNEL RELEASE Right 2023   CHOLECYSTECTOMY     ESOPHAGOGASTRODUODENOSCOPY (EGD) WITH PROPOFOL N/A 01/12/2018   Procedure: ESOPHAGOGASTRODUODENOSCOPY (EGD) WITH PROPOFOL;  Surgeon: Lemar Lofty., MD;  Location: [facility];  Service: Gastroenterology;  Laterality: N/A;    EYE SURGERY Right    cataract   INTRAOPERATIVE TRANSTHORACIC ECHOCARDIOGRAM N/A 11/13/2021   Procedure: INTRAOPERATIVE TRANSTHORACIC ECHOCARDIOGRAM;  Surgeon: Tonny Bollman, MD;  Location: [facility] OR;  Service: Open Heart Surgery;  Laterality: N/A;   left elobow surgery  2003   left rotator cuff  2001   LUMBAR LAMINECTOMY/DECOMPRESSION MICRODISCECTOMY Left 12/23/2017   Procedure: Laminectomy for facet/synovial cyst - left - Lumbar three-Lumbar four;  Surgeon: Julio Sicks, MD;  Location: [facility] OR;  Service: Neurosurgery;  Laterality: Left;   LUMBAR SPINE SURGERY  08/12/2022   r foot surgery  1995   REVERSE SHOULDER ARTHROPLASTY Left 10/23/2018   Procedure: REVERSE TOTAL SHOULDER ARTHROPLASTY;  Surgeon: Beverely Low, MD;  Location: [facility];  Service: Orthopedics;  Laterality: Left;  interscalene block   REVERSE SHOULDER ARTHROPLASTY Right 04/20/2021   Procedure: REVERSE SHOULDER ARTHROPLASTY;  Surgeon: Beverely Low, MD;  Location: [facility];  Service: Orthopedics;  Laterality: Right;  with ISB   right hand surgery  2001   RIGHT HEART CATH AND CORONARY ANGIOGRAPHY N/A 10/24/2021   Procedure: RIGHT HEART CATH AND CORONARY ANGIOGRAPHY;  Surgeon: Tonny Bollman, MD;  Location: [facility] INVASIVE CV LAB;  Service: Cardiovascular;  Laterality: N/A;   right index finger surgery  2009   SHOULDER OPEN ROTATOR CUFF REPAIR  11/21/2011   Procedure: ROTATOR CUFF REPAIR SHOULDER OPEN;  stab on sit fit 4 way 10 x each 2# ankle wt LAQ,hip flex and abd on sit fit 10x 2# ankle wts HHA SL hip flex,ext and abd 10 x each 2# ankle wts HHA marching fwd and back 10 feet 2 x each, then side stepping 10 feet 2 x each HS curl 15# 2 sets 10 LAQ 5# 2 sets 10  11/12/22 Education   PATIENT EDUCATION:  Education details: POC Person educated: Patient Education method: Explanation Education comprehension: verbalized understanding  HOME EXERCISE PROGRAM:  [id number]  ASSESSMENT:  CLINICAL IMPRESSION: Patient reports continued achiness. Treatment started with some lower trunk mobilizations to stretch muscles, then progressed to strengthening postural muscles. Tolerated all exercises well with no pain.  OBJECTIVE IMPAIRMENTS: Abnormal gait, decreased activity tolerance, decreased balance, decreased coordination, difficulty walking, decreased ROM, decreased strength, improper body  mechanics, and pain.   ACTIVITY LIMITATIONS: carrying, lifting, bending, squatting, stairs, and locomotion level  PARTICIPATION LIMITATIONS: meal prep, cleaning, laundry, driving, shopping, and community activity  PERSONAL FACTORS: Past/current experiences are also affecting patient's functional outcome.   REHAB POTENTIAL: Good  CLINICAL DECISION MAKING: Evolving/moderate complexity  EVALUATION COMPLEXITY: Moderate   GOALS: Goals reviewed with patient? Yes  SHORT TERM GOALS: Target date: 11/28/22  I with initial HEP Baseline: Goal status: 11/26/22 Provided, ongoing  12/10/22/ MET  LONG TERM GOALS: Target date: 01/21/23  I with final HEP Baseline:  Goal status: INITIAL  2.  TUG in < 12 sec Baseline: 19.23, unsteady, no AD Goal status: 11/28/22 no AD 13.7 sec   12/10/22 no AD  11.26 unsteady but no LOB  3.  Increase B hip strength to at least 4/5 Baseline:  Goal status: progressing 12/10/22  4.  Patient will ambulate at least 400' on level or slightly unlevel surfaces without AD, I, minimized gait deviations and no unsteadiness. Baseline: In clinic, max 100', unsteady, multiple gait deviations noted. Goal status: progressing 12/10/22  5.  Patient will report at least 50% improvement in her pain symptoms with daily activities. Baseline: 6/10 Goal status:  no changes 12/10/22  PLAN:  PT FREQUENCY: 1-2x/week  PT DURATION: 10 weeks  PLANNED INTERVENTIONS: Therapeutic exercises, Therapeutic activity, Neuromuscular re-education, Balance training, Gait training, Patient/Family education, Self Care, Joint mobilization, Stair training, Dry Needling, Electrical stimulation, Cryotherapy, Moist heat, Ionotophoresis 4mg /ml Dexamethasone, and Manual therapy.  PLAN FOR NEXT SESSION:Dr Dutch Quint 11/7. Pt would like ot go down to 1 x a week starting next week  Encounter Date: 12/19/2022   Iona Beard, PT 12/19/2022, 11:41 AM  Patient Details  Name: Cheryl Cooper MRN:  [id number] Date of Birth: 1940-02-24 Referring Provider:  Mliss Sax,*  stab on sit fit 4 way 10 x each 2# ankle wt LAQ,hip flex and abd on sit fit 10x 2# ankle wts HHA SL hip flex,ext and abd 10 x each 2# ankle wts HHA marching fwd and back 10 feet 2 x each, then side stepping 10 feet 2 x each HS curl 15# 2 sets 10 LAQ 5# 2 sets 10  11/12/22 Education   PATIENT EDUCATION:  Education details: POC Person educated: Patient Education method: Explanation Education comprehension: verbalized understanding  HOME EXERCISE PROGRAM:  [id number]  ASSESSMENT:  CLINICAL IMPRESSION: Patient reports continued achiness. Treatment started with some lower trunk mobilizations to stretch muscles, then progressed to strengthening postural muscles. Tolerated all exercises well with no pain.  OBJECTIVE IMPAIRMENTS: Abnormal gait, decreased activity tolerance, decreased balance, decreased coordination, difficulty walking, decreased ROM, decreased strength, improper body  mechanics, and pain.   ACTIVITY LIMITATIONS: carrying, lifting, bending, squatting, stairs, and locomotion level  PARTICIPATION LIMITATIONS: meal prep, cleaning, laundry, driving, shopping, and community activity  PERSONAL FACTORS: Past/current experiences are also affecting patient's functional outcome.   REHAB POTENTIAL: Good  CLINICAL DECISION MAKING: Evolving/moderate complexity  EVALUATION COMPLEXITY: Moderate   GOALS: Goals reviewed with patient? Yes  SHORT TERM GOALS: Target date: 11/28/22  I with initial HEP Baseline: Goal status: 11/26/22 Provided, ongoing  12/10/22/ MET  LONG TERM GOALS: Target date: 01/21/23  I with final HEP Baseline:  Goal status: INITIAL  2.  TUG in < 12 sec Baseline: 19.23, unsteady, no AD Goal status: 11/28/22 no AD 13.7 sec   12/10/22 no AD  11.26 unsteady but no LOB  3.  Increase B hip strength to at least 4/5 Baseline:  Goal status: progressing 12/10/22  4.  Patient will ambulate at least 400' on level or slightly unlevel surfaces without AD, I, minimized gait deviations and no unsteadiness. Baseline: In clinic, max 100', unsteady, multiple gait deviations noted. Goal status: progressing 12/10/22  5.  Patient will report at least 50% improvement in her pain symptoms with daily activities. Baseline: 6/10 Goal status:  no changes 12/10/22  PLAN:  PT FREQUENCY: 1-2x/week  PT DURATION: 10 weeks  PLANNED INTERVENTIONS: Therapeutic exercises, Therapeutic activity, Neuromuscular re-education, Balance training, Gait training, Patient/Family education, Self Care, Joint mobilization, Stair training, Dry Needling, Electrical stimulation, Cryotherapy, Moist heat, Ionotophoresis 4mg /ml Dexamethasone, and Manual therapy.  PLAN FOR NEXT SESSION:Dr Dutch Quint 11/7. Pt would like ot go down to 1 x a week starting next week  Encounter Date: 12/19/2022   Iona Beard, PT 12/19/2022, 11:41 AM  Patient Details  Name: Cheryl Cooper MRN:  [id number] Date of Birth: 1940-02-24 Referring Provider:  Mliss Sax,*  OUTPATIENT PHYSICAL THERAPY THORACOLUMBAR    Patient Name: Cheryl Cooper MRN: [id number] DOB:[birth year], 82 y.o., female Today's Date: 12/19/2022  END OF SESSION:  PT End of Session - 12/19/22 1130     Visit Number 9    Date for PT Re-Evaluation 01/21/23    PT Start Time 1100    PT Stop Time 1140    PT Time Calculation (min) 40 min    Activity Tolerance Patient tolerated treatment well    Behavior During Therapy WFL for tasks assessed/performed            Past Medical History:  Diagnosis Date   Anxiety    Arthritis    Carpal tunnel syndrome    Deafness in left ear    Depression    Diabetes mellitus    diet controlled   Diverticulitis    GERD (gastroesophageal reflux disease)    Hypercholesteremia    Hypertension    Hypothyroid    Memory loss    reports d/t concussion   PONV (postoperative nausea and vomiting) yrs ago, none recent   Post concussion syndrome    S/P TAVR (transcatheter aortic valve replacement) 11/13/2021   s/p TAVR with a 23 mm Edwards S3UR via the TF approach by Dr. Excell Seltzer and Dr. Leafy Ro   Severe aortic stenosis    Stroke [facility])    Past Surgical History:  Procedure Laterality Date   ABDOMINAL HYSTERECTOMY  1986   ANTERIOR LATERAL LUMBAR FUSION WITH PERCUTANEOUS SCREW 1 LEVEL Left 01/25/2022   Procedure: ANTERIOR LATERAL LUMBAR FUSION WITH PERCUTANEOUS SCREW LUMBAR THREE-FOUR;  Surgeon: Julio Sicks, MD;  Location: [facility] OR;  Service: Neurosurgery;  Laterality: Left;   BACK SURGERY  2010   lower   BIOPSY  01/12/2018   Procedure: BIOPSY;  Surgeon: Meridee Score Netty Starring., MD;  Location: [facility];  Service: Gastroenterology;;   CARDIAC CATHETERIZATION     CARPAL TUNNEL RELEASE Right 2023   CHOLECYSTECTOMY     ESOPHAGOGASTRODUODENOSCOPY (EGD) WITH PROPOFOL N/A 01/12/2018   Procedure: ESOPHAGOGASTRODUODENOSCOPY (EGD) WITH PROPOFOL;  Surgeon: Lemar Lofty., MD;  Location: [facility];  Service: Gastroenterology;  Laterality: N/A;    EYE SURGERY Right    cataract   INTRAOPERATIVE TRANSTHORACIC ECHOCARDIOGRAM N/A 11/13/2021   Procedure: INTRAOPERATIVE TRANSTHORACIC ECHOCARDIOGRAM;  Surgeon: Tonny Bollman, MD;  Location: [facility] OR;  Service: Open Heart Surgery;  Laterality: N/A;   left elobow surgery  2003   left rotator cuff  2001   LUMBAR LAMINECTOMY/DECOMPRESSION MICRODISCECTOMY Left 12/23/2017   Procedure: Laminectomy for facet/synovial cyst - left - Lumbar three-Lumbar four;  Surgeon: Julio Sicks, MD;  Location: [facility] OR;  Service: Neurosurgery;  Laterality: Left;   LUMBAR SPINE SURGERY  08/12/2022   r foot surgery  1995   REVERSE SHOULDER ARTHROPLASTY Left 10/23/2018   Procedure: REVERSE TOTAL SHOULDER ARTHROPLASTY;  Surgeon: Beverely Low, MD;  Location: [facility];  Service: Orthopedics;  Laterality: Left;  interscalene block   REVERSE SHOULDER ARTHROPLASTY Right 04/20/2021   Procedure: REVERSE SHOULDER ARTHROPLASTY;  Surgeon: Beverely Low, MD;  Location: [facility];  Service: Orthopedics;  Laterality: Right;  with ISB   right hand surgery  2001   RIGHT HEART CATH AND CORONARY ANGIOGRAPHY N/A 10/24/2021   Procedure: RIGHT HEART CATH AND CORONARY ANGIOGRAPHY;  Surgeon: Tonny Bollman, MD;  Location: [facility] INVASIVE CV LAB;  Service: Cardiovascular;  Laterality: N/A;   right index finger surgery  2009   SHOULDER OPEN ROTATOR CUFF REPAIR  11/21/2011   Procedure: ROTATOR CUFF REPAIR SHOULDER OPEN;

## 2022-12-24 ENCOUNTER — Ambulatory Visit: Payer: Medicare PPO

## 2022-12-25 ENCOUNTER — Ambulatory Visit: Payer: Medicare PPO | Admitting: Physical Therapy

## 2022-12-26 DIAGNOSIS — M4310 Spondylolisthesis, site unspecified: Secondary | ICD-10-CM | POA: Diagnosis not present

## 2022-12-31 ENCOUNTER — Ambulatory Visit: Payer: Medicare PPO | Admitting: Physical Therapy

## 2023-01-02 ENCOUNTER — Other Ambulatory Visit: Payer: Self-pay | Admitting: Family Medicine

## 2023-01-07 ENCOUNTER — Ambulatory Visit: Payer: Medicare PPO | Admitting: Physical Therapy

## 2023-02-17 DIAGNOSIS — L821 Other seborrheic keratosis: Secondary | ICD-10-CM | POA: Diagnosis not present

## 2023-02-17 DIAGNOSIS — L905 Scar conditions and fibrosis of skin: Secondary | ICD-10-CM | POA: Diagnosis not present

## 2023-02-17 DIAGNOSIS — D225 Melanocytic nevi of trunk: Secondary | ICD-10-CM | POA: Diagnosis not present

## 2023-02-17 DIAGNOSIS — Z85828 Personal history of other malignant neoplasm of skin: Secondary | ICD-10-CM | POA: Diagnosis not present

## 2023-02-17 DIAGNOSIS — L853 Xerosis cutis: Secondary | ICD-10-CM | POA: Diagnosis not present

## 2023-02-27 DIAGNOSIS — M4310 Spondylolisthesis, site unspecified: Secondary | ICD-10-CM | POA: Diagnosis not present

## 2023-02-28 ENCOUNTER — Other Ambulatory Visit: Payer: Self-pay | Admitting: Neurosurgery

## 2023-02-28 DIAGNOSIS — M4310 Spondylolisthesis, site unspecified: Secondary | ICD-10-CM

## 2023-03-03 ENCOUNTER — Encounter: Payer: Self-pay | Admitting: Neurosurgery

## 2023-03-18 ENCOUNTER — Other Ambulatory Visit: Payer: Self-pay | Admitting: Neurosurgery

## 2023-03-18 ENCOUNTER — Ambulatory Visit
Admission: RE | Admit: 2023-03-18 | Discharge: 2023-03-18 | Disposition: A | Discharge status: Home / Self Care | Payer: Medicare PPO | Source: Ambulatory Visit | Attending: Neurosurgery | Admitting: Neurosurgery

## 2023-03-18 DIAGNOSIS — M4310 Spondylolisthesis, site unspecified: Secondary | ICD-10-CM

## 2023-03-19 ENCOUNTER — Ambulatory Visit
Admission: RE | Admit: 2023-03-19 | Discharge: 2023-03-19 | Discharge status: Home / Self Care | Payer: Medicare PPO | Source: Ambulatory Visit | Attending: Neurosurgery | Admitting: Neurosurgery

## 2023-03-19 DIAGNOSIS — Z981 Arthrodesis status: Secondary | ICD-10-CM | POA: Diagnosis not present

## 2023-03-19 DIAGNOSIS — M4310 Spondylolisthesis, site unspecified: Secondary | ICD-10-CM

## 2023-03-21 ENCOUNTER — Other Ambulatory Visit: Payer: Self-pay | Admitting: Family Medicine

## 2023-03-21 DIAGNOSIS — I10 Essential (primary) hypertension: Secondary | ICD-10-CM

## 2023-03-25 ENCOUNTER — Other Ambulatory Visit: Payer: Self-pay | Admitting: Family Medicine

## 2023-03-25 DIAGNOSIS — E7800 Pure hypercholesterolemia, unspecified: Secondary | ICD-10-CM

## 2023-03-26 DIAGNOSIS — H11003 Unspecified pterygium of eye, bilateral: Secondary | ICD-10-CM | POA: Diagnosis not present

## 2023-03-26 DIAGNOSIS — Z961 Presence of intraocular lens: Secondary | ICD-10-CM | POA: Diagnosis not present

## 2023-03-26 DIAGNOSIS — H02403 Unspecified ptosis of bilateral eyelids: Secondary | ICD-10-CM | POA: Diagnosis not present

## 2023-03-26 DIAGNOSIS — H5203 Hypermetropia, bilateral: Secondary | ICD-10-CM | POA: Diagnosis not present

## 2023-03-26 DIAGNOSIS — E119 Type 2 diabetes mellitus without complications: Secondary | ICD-10-CM | POA: Diagnosis not present

## 2023-03-26 DIAGNOSIS — H04123 Dry eye syndrome of bilateral lacrimal glands: Secondary | ICD-10-CM | POA: Diagnosis not present

## 2023-03-26 DIAGNOSIS — H524 Presbyopia: Secondary | ICD-10-CM | POA: Diagnosis not present

## 2023-03-26 DIAGNOSIS — H52203 Unspecified astigmatism, bilateral: Secondary | ICD-10-CM | POA: Diagnosis not present

## 2023-04-02 NOTE — Progress Notes (Unsigned)
Cardiology Office Note:  .   Date:  04/03/2023  ID:  Cheryl Cooper, DOB 13-Mar-1940, MRN [id number] PCP: Mliss Sax, MD  [facility] Providers Cardiologist:  Reatha Harps, MD    History of Present Illness: .    Chief Complaint  Patient presents with   Follow-up         Cheryl Cooper is a 83 y.o. female with history of CAD, aortic stenosis s/p TAVR, HLD who presents for follow-up.   History of Present Illness   Cheryl Cooper is an 83 year old female with CAD and aortic stenosis who presents for follow-up post TAVR.  She underwent aortic valve replacement with TAVR on November 13, 2021, with a 23 mm prosthetic valve placed. An echocardiogram in November 2024 showed stable function of the prosthetic valve. No chest pain or trouble breathing. She is currently participating in an exercise class twice a week.  She has a history of hypertension, which is well-controlled with amlodipine 5 mg daily. Her blood pressure was noted to be stable today.  She has a history of hyperlipidemia and is on Crestor 10 mg daily and Zetia 10 mg daily. Her most recent LDL was 61.  She has diabetes, which is well-managed, and her recent diabetes numbers were reported as good.  She reports arthritis in her back and neck, with ongoing back pain. She has had multiple back surgeries, with the most recent report indicating possible loose screws. She is planning to follow up with her back surgeon next week.          Problem List 1. DM -A1c 6.4 2. HLD -T chol 128, HDL 44, LDL 61, TG 113 3. HTN 4. Aortic stenosis -23 mm S3 11/13/2021 5. CAD -60% D1    ROS: All other ROS reviewed and negative. Pertinent positives noted in the HPI.     Studies Reviewed: [name]   EKG Interpretation Date/Time:  Thursday April 03 2023 09:09:31 EST Ventricular Rate:  55 PR Interval:  188 QRS Duration:  72 QT Interval:  462 QTC Calculation: 441 R Axis:   -61  Text Interpretation: Sinus  bradycardia with sinus arrhythmia Inferior infarct , age undetermined Confirmed by Lennie Odor [phone]) on 04/03/2023 9:10:27 AM   TTE 11/08/2022  1. Left ventricular ejection fraction, by estimation, is >75%. The left  ventricle has hyperdynamic function. The left ventricle has no regional  wall motion abnormalities. Left ventricular diastolic parameters are  consistent with Grade I diastolic  dysfunction (impaired relaxation). Elevated left atrial pressure.   2. Right ventricular systolic function is normal. The right ventricular  size is normal. Tricuspid regurgitation signal is inadequate for assessing  PA pressure.   3. Left atrial size was mildly dilated.   4. The mitral valve is degenerative. No evidence of mitral valve  regurgitation. Mild mitral stenosis. The mean mitral valve gradient is 4.0  mmHg with average heart rate of 55 bpm. Moderate mitral annular  calcification.   5. The aortic valve has been repaired/replaced. Aortic valve  regurgitation is not visualized. There is a valve present in the aortic  position. Aortic valve mean gradient measures 19.0 mmHg. Aortic valve Vmax  measures 2.93 m/s. Aortic valve acceleration  time measures 77 msec.   6. The inferior vena cava is normal in size with greater than 50%  respiratory variability, suggesting right atrial pressure of 3 mmHg.   LHC 10/24/2021 1.  Patent coronary arteries (right dominant) with a moderate ostial diagonal  stenosis and no other significant coronary artery disease 2.  Normal right heart pressures and cardiac output 3.  Severe calcification and restriction of the aortic valve leaflets seen on plain fluoroscopy with known severe aortic stenosis.  Severe calcification also identified in the aortic root Physical Exam:   VS:  BP 122/60   Pulse (!) 55   Ht 5\' 3"  (1.6 m)   Wt 141 lb 3.2 oz (64 kg)   SpO2 96%   BMI 25.01 kg/m    Wt Readings from Last 3 Encounters:  04/03/23 141 lb 3.2 oz (64 kg)  12/02/22 138  lb 6.4 oz (62.8 kg)  11/08/22 133 lb 6.4 oz (60.5 kg)    GEN: Well nourished, well developed in no acute distress NECK: No JVD; No carotid bruits CARDIAC: RRR, 2/6 SEM, rubs, gallops RESPIRATORY:  Clear to auscultation without rales, wheezing or rhonchi  ABDOMEN: Soft, non-tender, non-distended EXTREMITIES:  No edema; No deformity  ASSESSMENT AND PLAN: .   Assessment and Plan    Aortic Stenosis Status Post TAVR Stable function of the prosthetic valve on recent echocardiogram. Faint murmur on exam and mean gradient increased from 14 to 19 mmHg on recent echocardiogram. -Order echocardiogram this year to ensure stability. -on ASA -takes ABX before dental work  Hypertension Well controlled on current regimen. -Continue Amlodipine 5mg  daily.  Coronary Artery Disease 60% diagonal lesion with no symptoms of angina. EKG unchanged from prior. -Continue Aspirin and Crestor 10mg  daily, Zetia 10mg  daily.  Hyperlipidemia LDL 61 on current regimen. -Continue Crestor 10mg  daily, Zetia 10mg  daily.  Diabetes Well controlled. -Continue current management.  Arthritis Reports arthritis in back and neck, currently managed by surgery. -Continue current management with surgery.  General Health Maintenance -Continue Aspirin, understands need for antibiotics before dental work due to TAVR. -Schedule one year follow-up.              Follow-up: Return in about 1 year (around 04/02/2024).   Signed, Lenna Gilford. Flora Lipps, MD, [facility]  [address] [place], [place] [address] [phone]  9:30 AM

## 2023-04-03 ENCOUNTER — Other Ambulatory Visit: Payer: Self-pay | Admitting: Gastroenterology

## 2023-04-03 ENCOUNTER — Ambulatory Visit
Admission: RE | Admit: 2023-04-03 | Discharge: 2023-04-03 | Disposition: A | Discharge status: Home / Self Care | Payer: Medicare PPO | Source: Ambulatory Visit | Attending: Gastroenterology | Admitting: Gastroenterology

## 2023-04-03 ENCOUNTER — Encounter: Payer: Self-pay | Admitting: Cardiovascular Disease

## 2023-04-03 ENCOUNTER — Ambulatory Visit: Payer: Medicare PPO | Attending: Cardiovascular Disease | Admitting: Cardiovascular Disease

## 2023-04-03 VITALS — BP 122/60 | HR 55 | Ht 63.0 in | Wt 141.2 lb

## 2023-04-03 DIAGNOSIS — K5900 Constipation, unspecified: Secondary | ICD-10-CM

## 2023-04-03 DIAGNOSIS — I2510 Atherosclerotic heart disease of native coronary artery without angina pectoris: Secondary | ICD-10-CM | POA: Diagnosis not present

## 2023-04-03 DIAGNOSIS — E785 Hyperlipidemia, unspecified: Secondary | ICD-10-CM | POA: Diagnosis not present

## 2023-04-03 DIAGNOSIS — Z2989 Encounter for other specified prophylactic measures: Secondary | ICD-10-CM | POA: Diagnosis not present

## 2023-04-03 DIAGNOSIS — Z952 Presence of prosthetic heart valve: Secondary | ICD-10-CM | POA: Diagnosis not present

## 2023-04-03 NOTE — Patient Instructions (Signed)
Medication Instructions:  No changes. *If you need a refill on your cardiac medications before your next appointment, please call your pharmacy*   Testing/Procedures: Your physician has requested that you have an echocardiogram. Echocardiography is a painless test that uses sound waves to create images of your heart. It provides your doctor with information about the size and shape of your heart and how well your heart's chambers and valves are working. This procedure takes approximately one hour. There are no restrictions for this procedure. Please do NOT wear cologne, perfume, aftershave, or lotions (deodorant is allowed). Please arrive 15 minutes prior to your appointment time. [address] [place]  Please note: We ask at that you not bring children with you during ultrasound (echo/ vascular) testing. Due to room size and safety concerns, children are not allowed in the ultrasound rooms during exams. Our front office staff cannot provide observation of children in our lobby area while testing is being conducted. An adult accompanying a patient to their appointment will only be allowed in the ultrasound room at the discretion of the ultrasound technician under special circumstances. We apologize for any inconvenience.    Follow-Up: At [facility], you and your health needs are our priority.  As part of our continuing mission to provide you with exceptional heart care, we have created designated Provider Care Teams.  These Care Teams include your primary Cardiologist (physician) and Advanced Practice Providers (APPs -  Physician Assistants and Nurse Practitioners) who all work together to provide you with the care you need, when you need it.  We recommend signing up for the patient portal called "MyChart".  Sign up information is provided on this After Visit Summary.  MyChart is used to connect with patients for Virtual Visits (Telemedicine).  Patients are able to view lab/test  results, encounter notes, upcoming appointments, etc.  Non-urgent messages can be sent to your provider as well.   To learn more about what you can do with MyChart, go to [email or web address].    Your next appointment:   1 year(s)  Provider:   Reatha Harps, MD     Other Instructions

## 2023-04-04 DIAGNOSIS — Z1231 Encounter for screening mammogram for malignant neoplasm of breast: Secondary | ICD-10-CM | POA: Diagnosis not present

## 2023-04-04 DIAGNOSIS — R92323 Mammographic fibroglandular density, bilateral breasts: Secondary | ICD-10-CM | POA: Diagnosis not present

## 2023-04-04 LAB — HM MAMMOGRAPHY

## 2023-04-08 ENCOUNTER — Encounter: Payer: Self-pay | Admitting: Family Medicine

## 2023-04-15 DIAGNOSIS — Z85828 Personal history of other malignant neoplasm of skin: Secondary | ICD-10-CM | POA: Diagnosis not present

## 2023-04-15 DIAGNOSIS — L300 Nummular dermatitis: Secondary | ICD-10-CM | POA: Diagnosis not present

## 2023-04-17 DIAGNOSIS — M542 Cervicalgia: Secondary | ICD-10-CM | POA: Diagnosis not present

## 2023-04-17 DIAGNOSIS — T8484XA Pain due to internal orthopedic prosthetic devices, implants and grafts, initial encounter: Secondary | ICD-10-CM | POA: Diagnosis not present

## 2023-04-18 ENCOUNTER — Other Ambulatory Visit: Payer: Self-pay | Admitting: Neurosurgery

## 2023-04-22 ENCOUNTER — Encounter: Payer: Self-pay | Admitting: Nurse Practitioner

## 2023-04-22 ENCOUNTER — Ambulatory Visit: Admitting: Nurse Practitioner

## 2023-04-22 VITALS — BP 102/82 | HR 62

## 2023-04-22 DIAGNOSIS — B3731 Acute candidiasis of vulva and vagina: Secondary | ICD-10-CM | POA: Diagnosis not present

## 2023-04-22 DIAGNOSIS — R300 Dysuria: Secondary | ICD-10-CM

## 2023-04-22 DIAGNOSIS — N9489 Other specified conditions associated with female genital organs and menstrual cycle: Secondary | ICD-10-CM | POA: Diagnosis not present

## 2023-04-22 LAB — WET PREP FOR TRICH, YEAST, CLUE

## 2023-04-22 MED ORDER — FLUCONAZOLE 150 MG PO TABS
150.0000 mg | ORAL_TABLET | ORAL | 0 refills | Status: DC
Start: 2023-04-22 — End: 2023-10-01

## 2023-04-22 NOTE — Progress Notes (Signed)
   Acute Office Visit  Subjective:    Patient ID: Cheryl Cooper, female    DOB: 03-29-1940, 83 y.o.   MRN: [id number]   HPI 83 y.o. presents today for burning with urination and with wiping x 2-3 weeks. Denies itching, discharge or odor. Denies urinary frequency, urgency, hematuria, back pain. Uses vaginal estrogen.   No LMP recorded. Patient has had a hysterectomy.    Review of Systems  Constitutional: Negative.   Genitourinary:  Positive for dysuria and vaginal pain (Vulvar discomfort). Negative for difficulty urinating, flank pain, frequency, genital sores, hematuria, urgency and vaginal discharge.       Objective:    Physical Exam Constitutional:      Appearance: Normal appearance.  Genitourinary:    Vagina: No vaginal discharge or erythema.     Comments: Redness around introitus and inner labia minora    BP 102/82   Pulse 62   SpO2 97%  Wt Readings from Last 3 Encounters:  04/03/23 141 lb 3.2 oz (64 kg)  12/02/22 138 lb 6.4 oz (62.8 kg)  11/08/22 133 lb 6.4 oz (60.5 kg)        Patient informed chaperone available to be present for breast and/or pelvic exam. Patient has requested no chaperone to be present. Patient has been advised what will be completed during breast and pelvic exam.   Wet prep + yeast (budding present)  UA: neg leukocytes, neg blood, neg nitrites, yellow/slightly cloudy. Microscopic: wbc 0-5, rbc none, few bacteria, yeast present  Assessment & Plan:   Problem List Items Addressed This Visit   None Visit Diagnoses       Vulvovaginal candidiasis    -  Primary   Relevant Medications   fluconazole (DIFLUCAN) 150 MG tablet     Burning with urination       Relevant Orders   Urinalysis,Complete w/RFL Culture (Completed)     Vulvar burning       Relevant Orders   WET PREP FOR TRICH, YEAST, CLUE      Plan: Wet prep positive for yeast - Diflucan 150 mg every 3 days x 2 doses. UA unremarkanle, reflex culture pending.   Return if symptoms  worsen or fail to improve.    Olivia Mackie DNP, 1:00 PM 04/22/2023

## 2023-04-24 ENCOUNTER — Other Ambulatory Visit: Payer: Self-pay | Admitting: Nurse Practitioner

## 2023-04-24 ENCOUNTER — Ambulatory Visit (HOSPITAL_COMMUNITY): Payer: Medicare PPO

## 2023-04-24 ENCOUNTER — Encounter: Payer: Self-pay | Admitting: Nurse Practitioner

## 2023-04-24 DIAGNOSIS — N3000 Acute cystitis without hematuria: Secondary | ICD-10-CM

## 2023-04-24 LAB — URINALYSIS, COMPLETE W/RFL CULTURE
Bilirubin Urine: NEGATIVE
Glucose, UA: NEGATIVE
Hgb urine dipstick: NEGATIVE
Hyaline Cast: NONE SEEN /LPF
Ketones, ur: NEGATIVE
Leukocyte Esterase: NEGATIVE
Nitrites, Initial: NEGATIVE
Protein, ur: NEGATIVE
RBC / HPF: NONE SEEN /HPF (ref 0–2)
Specific Gravity, Urine: 1.025 (ref 1.001–1.035)
pH: 6 (ref 5.0–8.0)

## 2023-04-24 LAB — URINE CULTURE
MICRO NUMBER:: 16156258
SPECIMEN QUALITY:: ADEQUATE

## 2023-04-24 LAB — CULTURE INDICATED

## 2023-04-24 MED ORDER — SULFAMETHOXAZOLE-TRIMETHOPRIM 800-160 MG PO TABS: 1.0000 | ORAL_TABLET | Freq: Two times a day (BID) | ORAL | 0 refills | Status: AC

## 2023-04-30 ENCOUNTER — Ambulatory Visit (HOSPITAL_COMMUNITY): Attending: Cardiovascular Disease

## 2023-04-30 ENCOUNTER — Encounter (HOSPITAL_COMMUNITY): Payer: Self-pay

## 2023-04-30 DIAGNOSIS — Z952 Presence of prosthetic heart valve: Secondary | ICD-10-CM | POA: Insufficient documentation

## 2023-04-30 DIAGNOSIS — I5030 Unspecified diastolic (congestive) heart failure: Secondary | ICD-10-CM

## 2023-04-30 LAB — ECHOCARDIOGRAM COMPLETE
AR max vel: 1.98 cm2
AV Area VTI: 2.2 cm2
AV Area mean vel: 1.97 cm2
AV Mean grad: 20 mmHg
AV Peak grad: 37.7 mmHg
Ao pk vel: 3.07 m/s
Area-P 1/2: 2.11 cm2
S' Lateral: 1.9 cm

## 2023-04-30 NOTE — Progress Notes (Signed)
 PCP -  Dr Nadene Rubins Cardiologist -  Dr Lennie Odor  Chest x-ray - n/a EKG - 04/03/23 Stress Test - "Yrs ago" - unknown location ECHO - 04/30/23 Cardiac Cath - 10/24/21  ICD Pacemaker/Loop - n/a  Sleep Study -  n/a  Diabetes Type 2, no meds Fasting Blood Sugar -  100s Checks Blood Sugar ___1__ time a day  If your blood sugar is less than 70 mg/dL, you will need to treat for low blood sugar: Treat a low blood sugar (less than 70 mg/dL) with  cup of clear juice (cranberry or apple), 4 glucose tablets, OR glucose gel. Recheck blood sugar in 15 minutes after treatment (to make sure it is greater than 70 mg/dL). If your blood sugar is not greater than 70 mg/dL on recheck, call [phone] for further instructions.  Blood Thinner Instructions:  n/a  Aspirin Instructions: Follow your surgeon's instructions on when to stop aspirin prior to surgery,  If no instructions were given by your surgeon then you will need to call the office for those instructions.  NPO   Anesthesia review: Yes  STOP now taking any Aspirin (unless otherwise instructed by your surgeon), Aleve, Naproxen, Ibuprofen, Motrin, Advil, Goody's, BC's, all herbal medications, fish oil, and all vitamins.   Coronavirus Screening Do you have any of the following symptoms:  Cough yes/no: No Fever (>100.43F)  yes/no: No Runny nose yes/no: No Sore throat yes/no: No Difficulty breathing/shortness of breath  yes/no: No  Have you traveled in the last 14 days and where? yes/no: No  Patient verbalized understanding of instructions that were given to them at the PAT appointment. Patient was also instructed that they will need to review over the PAT instructions again at home before surgery.

## 2023-04-30 NOTE — Progress Notes (Signed)
 Surgical Instructions   Your procedure is scheduled on March 21.. Report to [facility] Main Entrance "A" at 6:00 A.M., then check in with the Admitting office. Any questions or running late day of surgery: call [phone]  Questions prior to your surgery date: call [phone], Monday-Friday, 8am-4pm. If you experience any cold or flu symptoms such as cough, fever, chills, shortness of breath, etc. between now and your scheduled surgery, please notify us at the above number.     Remember:  Do not eat or drink anything after midnight the night before your surgery    Take these medicines the morning of surgery with A SIP OF WATER  amLODipine (NORVASC)  escitalopram (LEXAPRO)  ezetimibe (ZETIA)  levothyroxine (SYNTHROID)  rosuvastatin (CRESTOR)   May take these medicines IF NEEDED: oxyCODONE-acetaminophen (PERCOCET/ROXICET)    One week prior to surgery, STOP taking any  Aleve, Naproxen, Ibuprofen, Motrin, Advil, Goody's, BC's, all herbal medications, fish oil, and non-prescription vitamins.          Follow your surgeon's instructions for stopping aspirin. If no instructions were given, you need to call your surgeon's office immediately.             Do NOT Smoke (Tobacco/Vaping) for 24 hours prior to your procedure.  If you use a CPAP at night, you may bring your mask/headgear for your overnight stay.   You will be asked to remove any contacts, glasses, piercing's, hearing aid's, dentures/partials prior to surgery. Please bring cases for these items if needed.    Patients discharged the day of surgery will not be allowed to drive home, and someone needs to stay with them for 24 hours.  SURGICAL WAITING ROOM VISITATION Patients may have no more than 2 support people in the waiting area - these visitors may rotate.   Pre-op nurse will coordinate an appropriate time for 1 ADULT support person, who may not rotate, to accompany patient in pre-op.  Children under the age of 13 must  have an adult with them who is not the patient and must remain in the main waiting area with an adult.  If the patient needs to stay at the hospital during part of their recovery, the visitor guidelines for inpatient rooms apply.  Please refer to the [facility] website for the visitor guidelines for any additional information.   If you received a COVID test during your pre-op visit  it is requested that you wear a mask when out in public, stay away from anyone that may not be feeling well and notify your surgeon if you develop symptoms. If you have been in contact with anyone that has tested positive in the last 10 days please notify you surgeon.      Pre-operative 5 CHG Bathing Instructions   You can play a key role in reducing the risk of infection after surgery. Your skin needs to be as free of germs as possible. You can reduce the number of germs on your skin by washing with CHG (chlorhexidine gluconate) soap before surgery. CHG is an antiseptic soap that kills germs and continues to kill germs even after washing.   DO NOT use if you have an allergy to chlorhexidine/CHG or antibacterial soaps. If your skin becomes reddened or irritated, stop using the CHG and notify one of our RNs at [phone].   Please shower with the CHG soap starting 4 days before surgery using the following schedule:     Please keep in mind the following:  DO NOT  shave, including legs and underarms, starting the day of your first shower.   You may shave your face at any point before/day of surgery.  Place clean sheets on your bed the day you start using CHG soap. Use a clean washcloth (not used since being washed) for each shower. DO NOT sleep with pets once you start using the CHG.   CHG Shower Instructions:  Wash your face and private area with normal soap. If you choose to wash your hair, wash first with your normal shampoo.  After you use shampoo/soap, rinse your hair and body thoroughly to remove  shampoo/soap residue.  Turn the water OFF and apply about 3 tablespoons (45 ml) of CHG soap to a CLEAN washcloth.  Apply CHG soap ONLY FROM YOUR NECK DOWN TO YOUR TOES (washing for 3-5 minutes)  DO NOT use CHG soap on face, private areas, open wounds, or sores.  Pay special attention to the area where your surgery is being performed.  If you are having back surgery, having someone wash your back for you may be helpful. Wait 2 minutes after CHG soap is applied, then you may rinse off the CHG soap.  Pat dry with a clean towel  Put on clean clothes/pajamas   If you choose to wear lotion, please use ONLY the CHG-compatible lotions that are listed below.  Additional instructions for the day of surgery: DO NOT APPLY any lotions, deodorants, cologne, or perfumes.   Do not bring valuables to the hospital. [facility] is not responsible for any belongings/valuables. Do not wear nail polish, gel polish, artificial nails, or any other type of covering on natural nails (fingers and toes) Do not wear jewelry or makeup Put on clean/comfortable clothes.  Please brush your teeth.  Ask your nurse before applying any prescription medications to the skin.     CHG Compatible Lotions   Aveeno Moisturizing lotion  Cetaphil Moisturizing Cream  Cetaphil Moisturizing Lotion  Clairol Herbal Essence Moisturizing Lotion, Dry Skin  Clairol Herbal Essence Moisturizing Lotion, Extra Dry Skin  Clairol Herbal Essence Moisturizing Lotion, Normal Skin  Curel Age Defying Therapeutic Moisturizing Lotion with Alpha Hydroxy  Curel Extreme Care Body Lotion  Curel Soothing Hands Moisturizing Hand Lotion  Curel Therapeutic Moisturizing Cream, Fragrance-Free  Curel Therapeutic Moisturizing Lotion, Fragrance-Free  Curel Therapeutic Moisturizing Lotion, Original Formula  Eucerin Daily Replenishing Lotion  Eucerin Dry Skin Therapy Plus Alpha Hydroxy Crme  Eucerin Dry Skin Therapy Plus Alpha Hydroxy Lotion  Eucerin  Original Crme  Eucerin Original Lotion  Eucerin Plus Crme Eucerin Plus Lotion  Eucerin TriLipid Replenishing Lotion  Keri Anti-Bacterial Hand Lotion  Keri Deep Conditioning Original Lotion Dry Skin Formula Softly Scented  Keri Deep Conditioning Original Lotion, Fragrance Free Sensitive Skin Formula  Keri Lotion Fast Absorbing Fragrance Free Sensitive Skin Formula  Keri Lotion Fast Absorbing Softly Scented Dry Skin Formula  Keri Original Lotion  Keri Skin Renewal Lotion Keri Silky Smooth Lotion  Keri Silky Smooth Sensitive Skin Lotion  Nivea Body Creamy Conditioning Oil  Nivea Body Extra Enriched Lotion  Nivea Body Original Lotion  Nivea Body Sheer Moisturizing Lotion Nivea Crme  Nivea Skin Firming Lotion  NutraDerm 30 Skin Lotion  NutraDerm Skin Lotion  NutraDerm Therapeutic Skin Cream  NutraDerm Therapeutic Skin Lotion  ProShield Protective Hand Cream  Provon moisturizing lotion  Please read over the following fact sheets that you were given.

## 2023-05-01 ENCOUNTER — Encounter (HOSPITAL_COMMUNITY): Payer: Self-pay

## 2023-05-01 ENCOUNTER — Encounter (HOSPITAL_COMMUNITY)
Admission: RE | Admit: 2023-05-01 | Discharge: 2023-05-01 | Disposition: A | Discharge status: Home / Self Care | Source: Ambulatory Visit | Attending: Neurosurgery | Admitting: Neurosurgery

## 2023-05-01 ENCOUNTER — Other Ambulatory Visit: Payer: Self-pay

## 2023-05-01 VITALS — BP 129/63 | HR 53 | Temp 97.7°F | Resp 17 | Ht 63.0 in | Wt 141.9 lb

## 2023-05-01 DIAGNOSIS — I2510 Atherosclerotic heart disease of native coronary artery without angina pectoris: Secondary | ICD-10-CM | POA: Insufficient documentation

## 2023-05-01 DIAGNOSIS — E785 Hyperlipidemia, unspecified: Secondary | ICD-10-CM | POA: Diagnosis not present

## 2023-05-01 DIAGNOSIS — Z7982 Long term (current) use of aspirin: Secondary | ICD-10-CM | POA: Diagnosis not present

## 2023-05-01 DIAGNOSIS — E119 Type 2 diabetes mellitus without complications: Secondary | ICD-10-CM | POA: Insufficient documentation

## 2023-05-01 DIAGNOSIS — Z01818 Encounter for other preprocedural examination: Secondary | ICD-10-CM

## 2023-05-01 DIAGNOSIS — Z01812 Encounter for preprocedural laboratory examination: Secondary | ICD-10-CM | POA: Diagnosis not present

## 2023-05-01 DIAGNOSIS — Z8673 Personal history of transient ischemic attack (TIA), and cerebral infarction without residual deficits: Secondary | ICD-10-CM | POA: Insufficient documentation

## 2023-05-01 DIAGNOSIS — I10 Essential (primary) hypertension: Secondary | ICD-10-CM | POA: Diagnosis not present

## 2023-05-01 LAB — COMPREHENSIVE METABOLIC PANEL
ALT: 24 U/L (ref 0–44)
AST: 29 U/L (ref 15–41)
Albumin: 3.7 g/dL (ref 3.5–5.0)
Alkaline Phosphatase: 43 U/L (ref 38–126)
Anion gap: 11 (ref 5–15)
BUN: 16 mg/dL (ref 8–23)
CO2: 25 mmol/L (ref 22–32)
Calcium: 9.5 mg/dL (ref 8.9–10.3)
Chloride: 102 mmol/L (ref 98–111)
Creatinine, Ser: 1.32 mg/dL — ABNORMAL HIGH (ref 0.44–1.00)
GFR, Estimated: 41 mL/min — ABNORMAL LOW (ref >60)
Glucose, Bld: 131 mg/dL — ABNORMAL HIGH (ref 70–99)
Potassium: 4.3 mmol/L (ref 3.5–5.1)
Sodium: 138 mmol/L (ref 135–145)
Total Bilirubin: 0.5 mg/dL (ref 0.0–1.2)
Total Protein: 7 g/dL (ref 6.5–8.1)

## 2023-05-01 LAB — CBC
HCT: 40.7 % (ref 36.0–46.0)
Hemoglobin: 13.5 g/dL (ref 12.0–15.0)
MCH: 29.8 pg (ref 26.0–34.0)
MCHC: 33.2 g/dL (ref 30.0–36.0)
MCV: 89.8 fL (ref 80.0–100.0)
Platelets: 166 10*3/uL (ref 150–400)
RBC: 4.53 MIL/uL (ref 3.87–5.11)
RDW: 13.4 % (ref 11.5–15.5)
WBC: 7.7 10*3/uL (ref 4.0–10.5)
nRBC: 0 % (ref 0.0–0.2)

## 2023-05-01 LAB — SURGICAL PCR SCREEN
MRSA, PCR: NEGATIVE
Staphylococcus aureus: NEGATIVE

## 2023-05-01 LAB — HEMOGLOBIN A1C
Hgb A1c MFr Bld: 6.3 % — ABNORMAL HIGH (ref 4.8–5.6)
Mean Plasma Glucose: 134.11 mg/dL

## 2023-05-02 ENCOUNTER — Telehealth: Payer: Self-pay | Admitting: *Deleted

## 2023-05-02 NOTE — Progress Notes (Signed)
 Anesthesia Chart Review:  83 year old female follows with cardiology for history of severe aortic stenosis s/p TAVR 10/2021, nonobstructive CAD, HTN and HLD.  Echo 04/30/2023 showed EF 70-75%, grade 2 DD, mid cavitary obstruction with peak gradient 34 mmHg at rest and 56 mmHg with Valsalva, bioprosthetic aortic valve with mean gradient 20 mmHg, mean mitral gradient 5 mmHg.  Per echo interpretation, both mitral and aortic gradient suspected elevated due to high flow state, no stenosis suggested.  Dr. Flora Lipps commented on echo stating, "Echocardiogram shows stable function of prosthetic aortic valve" and subsequently cleared patient for surgery stating, "okay to proceed to surgery.  No further testing needed.  Okay to hold aspirin before the procedure."  Other pertinent history includes PONV, GERD, hypothyroid, CVA, hepatic steatosis, diet-controlled DM2 (A1c 6.3 on preop labs)  Preop labs reviewed, creatinine mildly elevated 1.32, otherwise unremarkable.  EKG 04/03/2023: Sinus bradycardia with sinus arrhythmia.  Rate 55. Inferior infarct , age undetermined  TTE 04/30/23:  1. Left ventricular ejection fraction, by estimation, is 70 to 75%. Left  ventricular ejection fraction by 3D volume is 74 %. The left ventricle has  hyperdynamic function. The left ventricle has no regional wall motion  abnormalities. There is mild left  ventricular hypertrophy. Left ventricular diastolic parameters are  consistent with Grade II diastolic dysfunction (pseudonormalization).  Elevated left atrial pressure. Midcavitary obstruction with peak gradient  at rest and 56 mmHg with valsalva   2. Right ventricular systolic function is normal. The right ventricular  size is normal. Tricuspid regurgitation signal is inadequate for assessing  PA pressure.   3. The mitral valve is degenerative. Trivial mitral valve regurgitation.  The mean mitral valve gradient is 5.0 mmHg with average heart rate of 62  bpm. Moderate  mitral annular calcification. Suspect elevated MV gradient  due to high flow state, MVA is  normal by continuity equation   4. The inferior vena cava is normal in size with greater than 50%  respiratory variability, suggesting right atrial pressure of 3 mmHg.   5. The aortic valve has been repaired/replaced. Aortic valve  regurgitation is not visualized. There is a 23 mm Edwards Sapien  prosthetic (TAVR) valve present in the aortic position. Procedure Date:  11/13/2021. Elevated gradient through prosthetic valve  (Vmax 3.1 m/s, MG ), suspect this is driven by high flow state, as  EOA 2.2 cm^2 and DI 0.58 do not suggest prosthetic valve stenosis   RHC/LHC (pre-TAVR) 10/24/21:   1st Diag lesion is 60% stenosed.   1.  Patent coronary arteries (right dominant) with a moderate ostial diagonal stenosis and no other significant coronary artery disease 2.  Normal right heart pressures and cardiac output 3.  Severe calcification and restriction of the aortic valve leaflets seen on plain fluoroscopy with known severe aortic stenosis.  Severe calcification also identified in the aortic root   Recommendations: Continue evaluation for TAVR.      Zannie Cove [facility] Short Stay Center/Anesthesiology Phone [phone] 05/05/2023 12:38 PM

## 2023-05-02 NOTE — Telephone Encounter (Signed)
   Pre-operative Risk Assessment    Patient Name: Cheryl Cooper  DOB: [birth year] MRN: [id number]   Date of last office visit: 04/03/23 DR. O'NEAL Date of next office visit: NONE   Request for Surgical Clearance    Procedure:   REMOVAL OF L5 PEDICLE SCREW  Date of Surgery:  Clearance 05/09/23                                Surgeon:  DR. Julio Sicks Surgeon's Group or Practice Name:  [facility] Phone number:  [phone] Fax number:  [phone] ATTN; [name] EXT [id number]   Type of Clearance Requested:   - Medical  - Pharmacy:  Hold Aspirin     Type of Anesthesia:  General    Additional requests/questions:    Elpidio Anis   05/02/2023, 2:40 PM

## 2023-05-04 ENCOUNTER — Encounter: Payer: Self-pay | Admitting: Cardiovascular Disease

## 2023-05-05 NOTE — Telephone Encounter (Signed)
   Patient Name: Cheryl Cooper  DOB: 09/26/1940 MRN: [id number]  Primary Cardiologist: Reatha Harps, MD  Chart reviewed as part of pre-operative protocol coverage. Given past medical history and time since last visit, based on ACC/AHA guidelines, SADE HOLLON is at acceptable risk for the planned procedure without further cardiovascular testing.   Per Dr. Val Eagle' Jennette Kettle, "Okay to proceed to surgery.  No further testing needed. Okay to hold aspirin before the procedure."   Regarding ASA therapy, it may be stopped 5-7 days prior to surgery with a plan to resume it as soon as felt to be feasible from a surgical standpoint in the post-operative period.   I will route this recommendation to the requesting party via Epic fax function and remove from pre-op pool.  Please call with questions.  Rip Harbour, NP 05/05/2023, 7:37 AM

## 2023-05-05 NOTE — Anesthesia Preprocedure Evaluation (Signed)
 Anesthesia Evaluation  Patient identified by MRN, date of birth, ID band Patient awake    Reviewed: Allergy & Precautions, NPO status , Patient's Chart, lab work & pertinent test results  History of Anesthesia Complications (+) PONV and history of anesthetic complications  Airway Mallampati: III  TM Distance: >3 FB Neck ROM: Full  Mouth opening: Limited Mouth Opening  Dental no notable dental hx. (+) Teeth Intact, Dental Advisory Given   Pulmonary neg pulmonary ROS   Pulmonary exam normal breath sounds clear to auscultation       Cardiovascular hypertension, Pt. on medications + CAD  Normal cardiovascular exam+ Valvular Problems/Murmurs (s/p TAVR 10/2021)  Rhythm:Regular Rate:Normal  TTE 04/30/23:  1. Left ventricular ejection fraction, by estimation, is 70 to 75%. Left  ventricular ejection fraction by 3D volume is 74 %. The left ventricle has  hyperdynamic function. The left ventricle has no regional wall motion  abnormalities. There is mild left  ventricular hypertrophy. Left ventricular diastolic parameters are  consistent with Grade II diastolic dysfunction (pseudonormalization).  Elevated left atrial pressure. Midcavitary obstruction with peak gradient  at rest and 56 mmHg with valsalva   2. Right ventricular systolic function is normal. The right ventricular  size is normal. Tricuspid regurgitation signal is inadequate for assessing  PA pressure.   3. The mitral valve is degenerative. Trivial mitral valve regurgitation.  The mean mitral valve gradient is 5.0 mmHg with average heart rate of 62  bpm. Moderate mitral annular calcification. Suspect elevated MV gradient  due to high flow state, MVA is  normal by continuity equation   4. The inferior vena cava is normal in size with greater than 50%  respiratory variability, suggesting right atrial pressure of 3 mmHg.   5. The aortic valve has been repaired/replaced.  Aortic valve  regurgitation is not visualized. There is a 23 mm Edwards Sapien  prosthetic (TAVR) valve present in the aortic position. Procedure Date:  11/13/2021. Elevated gradient through prosthetic valve  (Vmax 3.1 m/s, MG ), suspect this is driven by high flow state, as  EOA 2.2 cm^2 and DI 0.58 do not suggest prosthetic valve stenosis     Neuro/Psych  PSYCHIATRIC DISORDERS Anxiety Depression    CVA    GI/Hepatic Neg liver ROS, PUD,GERD  ,,  Endo/Other  diabetes, Well ControlledHypothyroidism    Renal/GU negative Renal ROS  negative genitourinary   Musculoskeletal negative musculoskeletal ROS (+)    Abdominal   Peds  Hematology negative hematology ROS (+)   Anesthesia Other Findings   Reproductive/Obstetrics                             Anesthesia Physical Anesthesia Plan  ASA: 3  Anesthesia Plan: General   Post-op Pain Management: Tylenol PO (pre-op)*   Induction: Intravenous  PONV Risk Score and Plan: 4 or greater and Dexamethasone, Ondansetron and Treatment may vary due to age or medical condition  Airway Management Planned: Oral ETT  Additional Equipment:   Intra-op Plan:   Post-operative Plan: Extubation in OR  Informed Consent: I have reviewed the patients History and Physical, chart, labs and discussed the procedure including the risks, benefits and alternatives for the proposed anesthesia with the patient or authorized representative who has indicated his/her understanding and acceptance.     Dental advisory given  Plan Discussed with: CRNA  Anesthesia Plan Comments: (PAT note by Antionette Poles, PA-C:  83 year old female follows with cardiology for history of  severe aortic stenosis s/p TAVR 10/2021, nonobstructive CAD, HTN and HLD.  Echo 04/30/2023 showed EF 70-75%, grade 2 DD, mid cavitary obstruction with peak gradient 34 mmHg at rest and 56 mmHg with Valsalva, bioprosthetic aortic valve with mean gradient 20 mmHg,  mean mitral gradient 5 mmHg.  Per echo interpretation, both mitral and aortic gradient suspected elevated due to high flow state, no stenosis suggested.  Dr. Flora Lipps commented on echo stating, "Echocardiogram shows stable function of prosthetic aortic valve" and subsequently cleared patient for surgery stating, "okay to proceed to surgery.  No further testing needed.  Okay to hold aspirin before the procedure."  Other pertinent history includes PONV, GERD, hypothyroid, CVA, hepatic steatosis, diet-controlled DM2 (A1c 6.3 on preop labs)  Preop labs reviewed, creatinine mildly elevated 1.32, otherwise unremarkable.  EKG 04/03/2023: Sinus bradycardia with sinus arrhythmia.  Rate 55. Inferior infarct , age undetermined  TTE 04/30/23: 1. Left ventricular ejection fraction, by estimation, is 70 to 75%. Left  ventricular ejection fraction by 3D volume is 74 %. The left ventricle has  hyperdynamic function. The left ventricle has no regional wall motion  abnormalities. There is mild left  ventricular hypertrophy. Left ventricular diastolic parameters are  consistent with Grade II diastolic dysfunction (pseudonormalization).  Elevated left atrial pressure. Midcavitary obstruction with peak gradient  at rest and 56 mmHg with valsalva  2. Right ventricular systolic function is normal. The right ventricular  size is normal. Tricuspid regurgitation signal is inadequate for assessing  PA pressure.  3. The mitral valve is degenerative. Trivial mitral valve regurgitation.  The mean mitral valve gradient is 5.0 mmHg with average heart rate of 62  bpm. Moderate mitral annular calcification. Suspect elevated MV gradient  due to high flow state, MVA is  normal by continuity equation  4. The inferior vena cava is normal in size with greater than 50%  respiratory variability, suggesting right atrial pressure of 3 mmHg.  5. The aortic valve has been repaired/replaced. Aortic valve  regurgitation is not  visualized. There is a 23 mm Edwards Sapien  prosthetic (TAVR) valve present in the aortic position. Procedure Date:  11/13/2021. Elevated gradient through prosthetic valve  (Vmax 3.1 m/s, MG ), suspect this is driven by high flow state, as  EOA 2.2 cm^2 and DI 0.58 do not suggest prosthetic valve stenosis   RHC/LHC (pre-TAVR) 10/24/21:  1st Diag lesion is 60% stenosed.  1. Patent coronary arteries (right dominant) with a moderate ostial diagonal stenosis and no other significant coronary artery disease 2. Normal right heart pressures and cardiac output 3. Severe calcification and restriction of the aortic valve leaflets seen on plain fluoroscopy with known severe aortic stenosis. Severe calcification also identified in the aortic root  Recommendations: Continue evaluation for TAVR.  )        Anesthesia Quick Evaluation

## 2023-05-08 ENCOUNTER — Other Ambulatory Visit: Payer: Self-pay | Admitting: Family Medicine

## 2023-05-09 ENCOUNTER — Other Ambulatory Visit: Payer: Self-pay

## 2023-05-09 ENCOUNTER — Inpatient Hospital Stay (HOSPITAL_COMMUNITY)

## 2023-05-09 ENCOUNTER — Inpatient Hospital Stay (HOSPITAL_COMMUNITY): Payer: Self-pay

## 2023-05-09 ENCOUNTER — Inpatient Hospital Stay (HOSPITAL_COMMUNITY)
Admission: AD | Admit: 2023-05-09 | Discharge: 2023-05-10 | DRG: 497 | Disposition: A | Discharge status: Home / Self Care | Payer: Medicare PPO | Source: Ambulatory Visit | Attending: Neurosurgery | Admitting: Neurosurgery

## 2023-05-09 ENCOUNTER — Encounter (HOSPITAL_COMMUNITY)
Admission: AD | Disposition: A | Discharge status: Home / Self Care | Payer: Self-pay | Source: Ambulatory Visit | Attending: Neurosurgery

## 2023-05-09 ENCOUNTER — Encounter (HOSPITAL_COMMUNITY): Payer: Self-pay | Admitting: Neurosurgery

## 2023-05-09 ENCOUNTER — Inpatient Hospital Stay (HOSPITAL_COMMUNITY): Payer: Self-pay | Admitting: Physician Assistant

## 2023-05-09 DIAGNOSIS — E7800 Pure hypercholesterolemia, unspecified: Secondary | ICD-10-CM | POA: Diagnosis present

## 2023-05-09 DIAGNOSIS — Z90710 Acquired absence of both cervix and uterus: Secondary | ICD-10-CM | POA: Diagnosis not present

## 2023-05-09 DIAGNOSIS — Z8673 Personal history of transient ischemic attack (TIA), and cerebral infarction without residual deficits: Secondary | ICD-10-CM

## 2023-05-09 DIAGNOSIS — Z96641 Presence of right artificial hip joint: Secondary | ICD-10-CM | POA: Diagnosis present

## 2023-05-09 DIAGNOSIS — T84226A Displacement of internal fixation device of vertebrae, initial encounter: Principal | ICD-10-CM | POA: Diagnosis present

## 2023-05-09 DIAGNOSIS — Z96612 Presence of left artificial shoulder joint: Secondary | ICD-10-CM | POA: Diagnosis present

## 2023-05-09 DIAGNOSIS — Z96611 Presence of right artificial shoulder joint: Secondary | ICD-10-CM | POA: Diagnosis present

## 2023-05-09 DIAGNOSIS — K7460 Unspecified cirrhosis of liver: Secondary | ICD-10-CM | POA: Diagnosis not present

## 2023-05-09 DIAGNOSIS — Z91018 Allergy to other foods: Secondary | ICD-10-CM

## 2023-05-09 DIAGNOSIS — Z8249 Family history of ischemic heart disease and other diseases of the circulatory system: Secondary | ICD-10-CM | POA: Diagnosis not present

## 2023-05-09 DIAGNOSIS — J449 Chronic obstructive pulmonary disease, unspecified: Secondary | ICD-10-CM | POA: Diagnosis not present

## 2023-05-09 DIAGNOSIS — Y831 Surgical operation with implant of artificial internal device as the cause of abnormal reaction of the patient, or of later complication, without mention of misadventure at the time of the procedure: Secondary | ICD-10-CM | POA: Diagnosis present

## 2023-05-09 DIAGNOSIS — Z79899 Other long term (current) drug therapy: Secondary | ICD-10-CM | POA: Diagnosis not present

## 2023-05-09 DIAGNOSIS — Z888 Allergy status to other drugs, medicaments and biological substances status: Secondary | ICD-10-CM

## 2023-05-09 DIAGNOSIS — I10 Essential (primary) hypertension: Secondary | ICD-10-CM | POA: Diagnosis not present

## 2023-05-09 DIAGNOSIS — Z833 Family history of diabetes mellitus: Secondary | ICD-10-CM

## 2023-05-09 DIAGNOSIS — Z953 Presence of xenogenic heart valve: Secondary | ICD-10-CM | POA: Diagnosis not present

## 2023-05-09 DIAGNOSIS — Z885 Allergy status to narcotic agent status: Secondary | ICD-10-CM

## 2023-05-09 DIAGNOSIS — Z79890 Hormone replacement therapy (postmenopausal): Secondary | ICD-10-CM

## 2023-05-09 DIAGNOSIS — H9192 Unspecified hearing loss, left ear: Secondary | ICD-10-CM | POA: Diagnosis present

## 2023-05-09 DIAGNOSIS — K219 Gastro-esophageal reflux disease without esophagitis: Secondary | ICD-10-CM | POA: Diagnosis present

## 2023-05-09 DIAGNOSIS — Z823 Family history of stroke: Secondary | ICD-10-CM

## 2023-05-09 DIAGNOSIS — Z472 Encounter for removal of internal fixation device: Secondary | ICD-10-CM | POA: Diagnosis not present

## 2023-05-09 DIAGNOSIS — E039 Hypothyroidism, unspecified: Secondary | ICD-10-CM | POA: Diagnosis present

## 2023-05-09 DIAGNOSIS — T8484XA Pain due to internal orthopedic prosthetic devices, implants and grafts, initial encounter: Secondary | ICD-10-CM

## 2023-05-09 DIAGNOSIS — E119 Type 2 diabetes mellitus without complications: Secondary | ICD-10-CM | POA: Diagnosis present

## 2023-05-09 DIAGNOSIS — I2510 Atherosclerotic heart disease of native coronary artery without angina pectoris: Secondary | ICD-10-CM

## 2023-05-09 DIAGNOSIS — Z7982 Long term (current) use of aspirin: Secondary | ICD-10-CM | POA: Diagnosis not present

## 2023-05-09 DIAGNOSIS — Z981 Arthrodesis status: Secondary | ICD-10-CM | POA: Diagnosis not present

## 2023-05-09 HISTORY — PX: HARDWARE REMOVAL: SHX979

## 2023-05-09 LAB — GLUCOSE, CAPILLARY
Glucose-Capillary: 117 mg/dL — ABNORMAL HIGH (ref 70–99)
Glucose-Capillary: 144 mg/dL — ABNORMAL HIGH (ref 70–99)

## 2023-05-09 SURGERY — REMOVAL, HARDWARE
Anesthesia: General | Site: Back | Laterality: Left

## 2023-05-09 MED ORDER — LIDOCAINE 2% (20 MG/ML) 5 ML SYRINGE
INTRAMUSCULAR | Status: AC
  Filled 2023-05-09: qty 5

## 2023-05-09 MED ORDER — NEOSTIGMINE METHYLSULFATE 3 MG/3ML IV SOSY
PREFILLED_SYRINGE | INTRAVENOUS | Status: AC
  Filled 2023-05-09: qty 3

## 2023-05-09 MED ORDER — WOMENS MULTI GUMMIES PO CHEW: CHEWABLE_TABLET | Freq: Every day | ORAL | Status: DC

## 2023-05-09 MED ORDER — FENTANYL CITRATE (PF) 100 MCG/2ML IJ SOLN: 25.0000 ug | INTRAMUSCULAR | Status: DC | PRN

## 2023-05-09 MED ORDER — DEXAMETHASONE SODIUM PHOSPHATE 10 MG/ML IJ SOLN
INTRAMUSCULAR | Status: DC | PRN
  Administered 2023-05-09: 10 mg via INTRAVENOUS

## 2023-05-09 MED ORDER — CEFAZOLIN SODIUM-DEXTROSE 1-4 GM/50ML-% IV SOLN
1.0000 g | Freq: Three times a day (TID) | INTRAVENOUS | Status: AC
  Administered 2023-05-09 – 2023-05-10 (×2): 1 g via INTRAVENOUS
  Filled 2023-05-09 (×2): qty 50

## 2023-05-09 MED ORDER — BUPIVACAINE HCL (PF) 0.25 % IJ SOLN
INTRAMUSCULAR | Status: AC
  Filled 2023-05-09: qty 30

## 2023-05-09 MED ORDER — LEVOTHYROXINE SODIUM 75 MCG PO TABS
75.0000 ug | ORAL_TABLET | Freq: Every day | ORAL | Status: DC
  Administered 2023-05-10: 75 ug via ORAL
  Filled 2023-05-09: qty 1

## 2023-05-09 MED ORDER — CHILDRENS CHEW MULTIVITAMIN PO CHEW
1.0000 | CHEWABLE_TABLET | Freq: Every day | ORAL | Status: DC
Start: 2023-05-09 — End: 2023-05-10
  Administered 2023-05-10: 1 via ORAL
  Filled 2023-05-09 (×2): qty 1

## 2023-05-09 MED ORDER — SODIUM CHLORIDE 0.9 % IV SOLN
250.0000 mL | INTRAVENOUS | Status: AC
  Administered 2023-05-09: 250 mL via INTRAVENOUS

## 2023-05-09 MED ORDER — OXYCODONE HCL 5 MG PO TABS: 5.0000 mg | ORAL_TABLET | Freq: Once | ORAL | Status: DC | PRN

## 2023-05-09 MED ORDER — ONDANSETRON HCL 4 MG/2ML IJ SOLN
INTRAMUSCULAR | Status: AC
  Filled 2023-05-09: qty 2

## 2023-05-09 MED ORDER — SODIUM CHLORIDE 0.9% FLUSH
3.0000 mL | Freq: Two times a day (BID) | INTRAVENOUS | Status: DC
  Administered 2023-05-09 (×2): 3 mL via INTRAVENOUS

## 2023-05-09 MED ORDER — CYCLOBENZAPRINE HCL 10 MG PO TABS: 10.0000 mg | ORAL_TABLET | Freq: Three times a day (TID) | ORAL | Status: DC | PRN

## 2023-05-09 MED ORDER — KETOROLAC TROMETHAMINE 15 MG/ML IJ SOLN
7.5000 mg | Freq: Four times a day (QID) | INTRAMUSCULAR | Status: AC
  Administered 2023-05-09 – 2023-05-10 (×4): 7.5 mg via INTRAVENOUS
  Filled 2023-05-09 (×4): qty 1

## 2023-05-09 MED ORDER — ACETAMINOPHEN 500 MG PO TABS
1000.0000 mg | ORAL_TABLET | Freq: Once | ORAL | Status: AC
  Administered 2023-05-09: 1000 mg via ORAL
  Filled 2023-05-09: qty 2

## 2023-05-09 MED ORDER — CALCIUM 600+D PLUS MINERALS 600-400 MG-UNIT PO TABS: ORAL_TABLET | Freq: Every day | ORAL | Status: DC

## 2023-05-09 MED ORDER — ACETAMINOPHEN 650 MG RE SUPP: 650.0000 mg | RECTAL | Status: DC | PRN

## 2023-05-09 MED ORDER — PROPOFOL 10 MG/ML IV BOLUS
INTRAVENOUS | Status: AC
  Filled 2023-05-09: qty 20

## 2023-05-09 MED ORDER — PHENOL 1.4 % MT LIQD: 1.0000 | OROMUCOSAL | Status: DC | PRN

## 2023-05-09 MED ORDER — ORAL CARE MOUTH RINSE: 15.0000 mL | Freq: Once | OROMUCOSAL | Status: AC

## 2023-05-09 MED ORDER — FENTANYL CITRATE (PF) 250 MCG/5ML IJ SOLN
INTRAMUSCULAR | Status: AC
  Filled 2023-05-09: qty 5

## 2023-05-09 MED ORDER — ASPIRIN 81 MG PO CHEW
81.0000 mg | CHEWABLE_TABLET | Freq: Every day | ORAL | Status: DC
  Administered 2023-05-09 – 2023-05-10 (×2): 81 mg via ORAL
  Filled 2023-05-09 (×2): qty 1

## 2023-05-09 MED ORDER — 0.9 % SODIUM CHLORIDE (POUR BTL) OPTIME
TOPICAL | Status: DC | PRN
  Administered 2023-05-09: 1000 mL

## 2023-05-09 MED ORDER — HYDROMORPHONE HCL 1 MG/ML IJ SOLN: 1.0000 mg | INTRAMUSCULAR | Status: DC | PRN

## 2023-05-09 MED ORDER — PHENYLEPHRINE 80 MCG/ML (10ML) SYRINGE FOR IV PUSH (FOR BLOOD PRESSURE SUPPORT)
PREFILLED_SYRINGE | INTRAVENOUS | Status: AC
Start: 2023-05-09 — End: ?
  Filled 2023-05-09: qty 10

## 2023-05-09 MED ORDER — PROPOFOL 10 MG/ML IV BOLUS
INTRAVENOUS | Status: DC | PRN
  Administered 2023-05-09: 100 mg via INTRAVENOUS

## 2023-05-09 MED ORDER — SUGAMMADEX SODIUM 200 MG/2ML IV SOLN
INTRAVENOUS | Status: DC | PRN
  Administered 2023-05-09: 250 mg via INTRAVENOUS

## 2023-05-09 MED ORDER — AMISULPRIDE (ANTIEMETIC) 5 MG/2ML IV SOLN: 10.0000 mg | Freq: Once | INTRAVENOUS | Status: DC | PRN

## 2023-05-09 MED ORDER — ONDANSETRON HCL 4 MG/2ML IJ SOLN: 4.0000 mg | Freq: Four times a day (QID) | INTRAMUSCULAR | Status: DC | PRN

## 2023-05-09 MED ORDER — ESCITALOPRAM OXALATE 20 MG PO TABS
20.0000 mg | ORAL_TABLET | Freq: Every day | ORAL | Status: DC
  Administered 2023-05-10: 20 mg via ORAL
  Filled 2023-05-09: qty 1

## 2023-05-09 MED ORDER — AMLODIPINE BESYLATE 5 MG PO TABS
7.5000 mg | ORAL_TABLET | Freq: Every day | ORAL | Status: DC
  Administered 2023-05-10: 7.5 mg via ORAL
  Filled 2023-05-09: qty 1

## 2023-05-09 MED ORDER — SODIUM CHLORIDE 0.9% FLUSH: 3.0000 mL | INTRAVENOUS | Status: DC | PRN

## 2023-05-09 MED ORDER — SUGAMMADEX SODIUM 200 MG/2ML IV SOLN
INTRAVENOUS | Status: AC
  Filled 2023-05-09: qty 2

## 2023-05-09 MED ORDER — INSULIN ASPART 100 UNIT/ML IJ SOLN: 0.0000 [IU] | INTRAMUSCULAR | Status: DC | PRN

## 2023-05-09 MED ORDER — ACETAMINOPHEN 325 MG PO TABS: 650.0000 mg | ORAL_TABLET | ORAL | Status: DC | PRN

## 2023-05-09 MED ORDER — LACTATED RINGERS IV SOLN: INTRAVENOUS | Status: DC

## 2023-05-09 MED ORDER — ROCURONIUM BROMIDE 10 MG/ML (PF) SYRINGE
PREFILLED_SYRINGE | INTRAVENOUS | Status: AC
  Filled 2023-05-09: qty 10

## 2023-05-09 MED ORDER — CHLORHEXIDINE GLUCONATE CLOTH 2 % EX PADS: 6.0000 | MEDICATED_PAD | Freq: Once | CUTANEOUS | Status: DC

## 2023-05-09 MED ORDER — DEXAMETHASONE SODIUM PHOSPHATE 10 MG/ML IJ SOLN
INTRAMUSCULAR | Status: AC
  Filled 2023-05-09: qty 1

## 2023-05-09 MED ORDER — THROMBIN 20000 UNITS EX SOLR
CUTANEOUS | Status: AC
  Filled 2023-05-09: qty 20000

## 2023-05-09 MED ORDER — ARTIFICIAL TEARS OPHTHALMIC OINT
TOPICAL_OINTMENT | OPHTHALMIC | Status: AC
  Filled 2023-05-09: qty 3.5

## 2023-05-09 MED ORDER — OYSTER SHELL CALCIUM/D3 500-5 MG-MCG PO TABS
1.0000 | ORAL_TABLET | Freq: Every day | ORAL | Status: DC
  Administered 2023-05-09 – 2023-05-10 (×2): 1 via ORAL
  Filled 2023-05-09 (×2): qty 1

## 2023-05-09 MED ORDER — EZETIMIBE 10 MG PO TABS
10.0000 mg | ORAL_TABLET | Freq: Every day | ORAL | Status: DC
  Administered 2023-05-10: 10 mg via ORAL
  Filled 2023-05-09: qty 1

## 2023-05-09 MED ORDER — MENTHOL 3 MG MT LOZG: 1.0000 | LOZENGE | OROMUCOSAL | Status: DC | PRN

## 2023-05-09 MED ORDER — ROSUVASTATIN CALCIUM 5 MG PO TABS
10.0000 mg | ORAL_TABLET | Freq: Every day | ORAL | Status: DC
  Administered 2023-05-09 – 2023-05-10 (×2): 10 mg via ORAL
  Filled 2023-05-09 (×2): qty 2

## 2023-05-09 MED ORDER — CEFAZOLIN SODIUM-DEXTROSE 2-4 GM/100ML-% IV SOLN
2.0000 g | INTRAVENOUS | Status: AC
  Administered 2023-05-09: 2 g via INTRAVENOUS
  Filled 2023-05-09: qty 100

## 2023-05-09 MED ORDER — OXYCODONE HCL 5 MG/5ML PO SOLN: 5.0000 mg | Freq: Once | ORAL | Status: DC | PRN

## 2023-05-09 MED ORDER — EPHEDRINE SULFATE-NACL 50-0.9 MG/10ML-% IV SOSY
PREFILLED_SYRINGE | INTRAVENOUS | Status: DC | PRN
  Administered 2023-05-09: 5 mg via INTRAVENOUS

## 2023-05-09 MED ORDER — PANTOPRAZOLE SODIUM 40 MG PO TBEC
40.0000 mg | DELAYED_RELEASE_TABLET | Freq: Every day | ORAL | Status: DC
  Administered 2023-05-10: 40 mg via ORAL
  Filled 2023-05-09: qty 1

## 2023-05-09 MED ORDER — ONDANSETRON HCL 4 MG PO TABS
4.0000 mg | ORAL_TABLET | Freq: Four times a day (QID) | ORAL | Status: DC | PRN
  Filled 2023-05-09: qty 1

## 2023-05-09 MED ORDER — KETOROLAC TROMETHAMINE 30 MG/ML IJ SOLN
INTRAMUSCULAR | Status: AC
  Filled 2023-05-09: qty 1

## 2023-05-09 MED ORDER — SUCCINYLCHOLINE CHLORIDE 200 MG/10ML IV SOSY
PREFILLED_SYRINGE | INTRAVENOUS | Status: AC
  Filled 2023-05-09: qty 10

## 2023-05-09 MED ORDER — OXYCODONE HCL 5 MG PO TABS
5.0000 mg | ORAL_TABLET | ORAL | Status: DC | PRN
  Administered 2023-05-09 – 2023-05-10 (×3): 5 mg via ORAL
  Filled 2023-05-09 (×4): qty 1

## 2023-05-09 MED ORDER — LIDOCAINE 2% (20 MG/ML) 5 ML SYRINGE
INTRAMUSCULAR | Status: DC | PRN
  Administered 2023-05-09: 60 mg via INTRAVENOUS
  Administered 2023-05-09: 20 mg via INTRAVENOUS

## 2023-05-09 MED ORDER — ROCURONIUM BROMIDE 10 MG/ML (PF) SYRINGE
PREFILLED_SYRINGE | INTRAVENOUS | Status: DC | PRN
  Administered 2023-05-09: 60 mg via INTRAVENOUS

## 2023-05-09 MED ORDER — KETOROLAC TROMETHAMINE 15 MG/ML IJ SOLN
INTRAMUSCULAR | Status: DC | PRN
  Administered 2023-05-09: 15 mg via INTRAVENOUS

## 2023-05-09 MED ORDER — PHENYLEPHRINE 80 MCG/ML (10ML) SYRINGE FOR IV PUSH (FOR BLOOD PRESSURE SUPPORT)
PREFILLED_SYRINGE | INTRAVENOUS | Status: DC | PRN
  Administered 2023-05-09: 320 ug via INTRAVENOUS

## 2023-05-09 MED ORDER — FENTANYL CITRATE (PF) 250 MCG/5ML IJ SOLN
INTRAMUSCULAR | Status: DC | PRN
  Administered 2023-05-09: 50 ug via INTRAVENOUS

## 2023-05-09 MED ORDER — ONDANSETRON HCL 4 MG/2ML IJ SOLN
INTRAMUSCULAR | Status: DC | PRN
  Administered 2023-05-09: 4 mg via INTRAVENOUS

## 2023-05-09 MED ORDER — OXYCODONE HCL 5 MG PO TABS
10.0000 mg | ORAL_TABLET | ORAL | Status: DC | PRN
  Administered 2023-05-10: 10 mg via ORAL

## 2023-05-09 MED ORDER — EPHEDRINE 5 MG/ML INJ
INTRAVENOUS | Status: AC
  Filled 2023-05-09: qty 5

## 2023-05-09 MED ORDER — TRAZODONE HCL 50 MG PO TABS
50.0000 mg | ORAL_TABLET | Freq: Every day | ORAL | Status: DC
  Administered 2023-05-09: 50 mg via ORAL
  Filled 2023-05-09: qty 1

## 2023-05-09 MED ORDER — GLYCOPYRROLATE PF 0.2 MG/ML IJ SOSY
PREFILLED_SYRINGE | INTRAMUSCULAR | Status: AC
  Filled 2023-05-09: qty 1

## 2023-05-09 MED ORDER — THROMBIN 20000 UNITS EX SOLR
CUTANEOUS | Status: DC | PRN
  Administered 2023-05-09: 20 mL via TOPICAL

## 2023-05-09 MED ORDER — CHLORHEXIDINE GLUCONATE 0.12 % MT SOLN
15.0000 mL | Freq: Once | OROMUCOSAL | Status: AC
  Administered 2023-05-09: 15 mL via OROMUCOSAL
  Filled 2023-05-09: qty 15

## 2023-05-09 MED ORDER — BUPIVACAINE HCL (PF) 0.25 % IJ SOLN
INTRAMUSCULAR | Status: DC | PRN
  Administered 2023-05-09: 20 mL

## 2023-05-09 MED ORDER — OXYBUTYNIN CHLORIDE ER 5 MG PO TB24
15.0000 mg | ORAL_TABLET | Freq: Every day | ORAL | Status: DC
  Administered 2023-05-09: 15 mg via ORAL
  Filled 2023-05-09 (×2): qty 1

## 2023-05-09 SURGICAL SUPPLY — 39 items
BAG COUNTER SPONGE SURGICOUNT (BAG) ×1 IMPLANT
BENZOIN TINCTURE PRP APPL 2/3 (GAUZE/BANDAGES/DRESSINGS) ×1 IMPLANT
BLADE CLIPPER SURG (BLADE) IMPLANT
BUR CUTTER 7.0 ROUND (BURR) ×1 IMPLANT
CANISTER SUCT 3000ML PPV (MISCELLANEOUS) ×1 IMPLANT
DERMABOND ADVANCED .7 DNX12 (GAUZE/BANDAGES/DRESSINGS) ×1 IMPLANT
DRAPE HALF SHEET 40X57 (DRAPES) IMPLANT
DRAPE LAPAROTOMY 100X72X124 (DRAPES) ×1 IMPLANT
DRAPE MICROSCOPE SLANT 54X150 (MISCELLANEOUS) ×1 IMPLANT
DRAPE SURG 17X23 STRL (DRAPES) ×2 IMPLANT
DRSG OPSITE POSTOP 4X6 (GAUZE/BANDAGES/DRESSINGS) IMPLANT
ELECT REM PT RETURN 9FT ADLT (ELECTROSURGICAL) ×1 IMPLANT
ELECTRODE REM PT RTRN 9FT ADLT (ELECTROSURGICAL) ×1 IMPLANT
GAUZE 4X4 16PLY ~~LOC~~+RFID DBL (SPONGE) IMPLANT
GAUZE SPONGE 4X4 12PLY STRL (GAUZE/BANDAGES/DRESSINGS) ×1 IMPLANT
GLOVE BIO SURGEON STRL SZ 6 (GLOVE) ×1 IMPLANT
GLOVE BIOGEL PI IND STRL 6.5 (GLOVE) ×1 IMPLANT
GLOVE ECLIPSE 9.0 STRL (GLOVE) ×1 IMPLANT
GLOVE EXAM NITRILE XL STR (GLOVE) IMPLANT
GOWN STRL REUS W/ TWL LRG LVL3 (GOWN DISPOSABLE) IMPLANT
GOWN STRL REUS W/ TWL XL LVL3 (GOWN DISPOSABLE) ×1 IMPLANT
GOWN STRL REUS W/TWL 2XL LVL3 (GOWN DISPOSABLE) IMPLANT
KIT BASIN OR (CUSTOM PROCEDURE TRAY) ×1 IMPLANT
KIT TURNOVER KIT B (KITS) ×1 IMPLANT
NDL HYPO 22X1.5 SAFETY MO (MISCELLANEOUS) ×1 IMPLANT
NDL SPNL 22GX3.5 QUINCKE BK (NEEDLE) ×1 IMPLANT
NEEDLE HYPO 22X1.5 SAFETY MO (MISCELLANEOUS) ×1 IMPLANT
NEEDLE SPNL 22GX3.5 QUINCKE BK (NEEDLE) ×1 IMPLANT
NS IRRIG 1000ML POUR BTL (IV SOLUTION) ×1 IMPLANT
PACK LAMINECTOMY NEURO (CUSTOM PROCEDURE TRAY) ×1 IMPLANT
PAD ARMBOARD POSITIONER FOAM (MISCELLANEOUS) ×3 IMPLANT
SPIKE FLUID TRANSFER (MISCELLANEOUS) ×1 IMPLANT
SPONGE SURGIFOAM ABS GEL 100 (HEMOSTASIS) ×1 IMPLANT
STRIP CLOSURE SKIN 1/2X4 (GAUZE/BANDAGES/DRESSINGS) ×1 IMPLANT
SUT VIC AB 2-0 CT1 18 (SUTURE) ×1 IMPLANT
SUT VIC AB 3-0 SH 8-18 (SUTURE) ×1 IMPLANT
TOWEL GREEN STERILE (TOWEL DISPOSABLE) ×1 IMPLANT
TOWEL GREEN STERILE FF (TOWEL DISPOSABLE) ×1 IMPLANT
WATER STERILE IRR 1000ML POUR (IV SOLUTION) ×1 IMPLANT

## 2023-05-09 NOTE — H&P (Signed)
 Cheryl Cooper is an 83 y.o. female.   Chief Complaint: Back pain HPI: 83 year old female status post prior L3-4 and L4-5 decompression and fusion surgery.  Patient with persistent back pain worse on the left side.  Workup demonstrates evidence of lateral screw migration on the left at L5 however fusion at L4-5 appears to be healing solidly.  Plan is for removal of her loosened painful left L5 pedicle screw.  All of their instrumentation will be left intact.  Past Medical History:  Diagnosis Date   Anxiety    Arthritis    Carpal tunnel syndrome    Cirrhosis of liver (HCC) 07/2020   Deafness in left ear    does not wear hearing aid   Depression    Diabetes mellitus    diet controlled - type 2, no meds   Diverticulitis    GERD (gastroesophageal reflux disease)    Hypercholesteremia    Hypertension    Hypothyroid    Memory loss    reports d/t concussion - mild   PONV (postoperative nausea and vomiting) yrs ago, none recent   Post concussion syndrome    S/P TAVR (transcatheter aortic valve replacement) 11/13/2021   s/p TAVR with a 23 mm Edwards S3UR via the TF approach by Dr. Excell Seltzer and Dr. Leafy Ro   Severe aortic stenosis    Stroke [facility])     Past Surgical History:  Procedure Laterality Date   ABDOMINAL HYSTERECTOMY  1986   ANTERIOR LATERAL LUMBAR FUSION WITH PERCUTANEOUS SCREW 1 LEVEL Left 01/25/2022   Procedure: ANTERIOR LATERAL LUMBAR FUSION WITH PERCUTANEOUS SCREW LUMBAR THREE-FOUR;  Surgeon: Julio Sicks, MD;  Location: [facility] OR;  Service: Neurosurgery;  Laterality: Left;   BACK SURGERY  2010   lower   BIOPSY  01/12/2018   Procedure: BIOPSY;  Surgeon: Meridee Score Netty Starring., MD;  Location: [facility] ENDOSCOPY;  Service: Gastroenterology;;   CARDIAC CATHETERIZATION     CARPAL TUNNEL RELEASE Right 2023   CHOLECYSTECTOMY     ESOPHAGOGASTRODUODENOSCOPY (EGD) WITH PROPOFOL N/A 01/12/2018   Procedure: ESOPHAGOGASTRODUODENOSCOPY (EGD) WITH PROPOFOL;  Surgeon: Lemar Lofty.,  MD;  Location: [facility];  Service: Gastroenterology;  Laterality: N/A;   EYE SURGERY Right    cataract   INTRAOPERATIVE TRANSTHORACIC ECHOCARDIOGRAM N/A 11/13/2021   Procedure: INTRAOPERATIVE TRANSTHORACIC ECHOCARDIOGRAM;  Surgeon: Tonny Bollman, MD;  [facility] OR;  Service: Open Heart Surgery;  Laterality: N/A;   left elobow surgery  2003   left rotator cuff  2001   LUMBAR LAMINECTOMY/DECOMPRESSION MICRODISCECTOMY Left 12/23/2017   Procedure: Laminectomy for facet/synovial cyst - left - Lumbar three-Lumbar four;  Surgeon: Julio Sicks, MD;  Location: [facility] OR;  Service: Neurosurgery;  Laterality: Left;   LUMBAR SPINE SURGERY  08/12/2022   r foot surgery  1995   REVERSE SHOULDER ARTHROPLASTY Left 10/23/2018   Procedure: REVERSE TOTAL SHOULDER ARTHROPLASTY;  Surgeon: Beverely Low, MD;  Location: [facility] ORS;  Service: Orthopedics;  Laterality: Left;  interscalene block   REVERSE SHOULDER ARTHROPLASTY Right 04/20/2021   Procedure: REVERSE SHOULDER ARTHROPLASTY;  Surgeon: Beverely Low, MD;  Location: [facility] ORS;  Service: Orthopedics;  Laterality: Right;  with ISB   right hand surgery  2001   RIGHT HEART CATH AND CORONARY ANGIOGRAPHY N/A 10/24/2021   Procedure: RIGHT HEART CATH AND CORONARY ANGIOGRAPHY;  Surgeon: Tonny Bollman, MD;  Location: [facility] INVASIVE CV LAB;  Service: Cardiovascular;  Laterality: N/A;   right index finger surgery  2009   SHOULDER OPEN ROTATOR CUFF REPAIR  11/21/2011   Procedure: ROTATOR  CUFF REPAIR SHOULDER OPEN;  Surgeon: Javier Docker, MD;  Location: [facility] ORS;  Service: Orthopedics;  Laterality: Right;  with subacromial decompression   SHOULDER OPEN ROTATOR CUFF REPAIR Right 05/09/2016   Procedure: Right shoulder mini open revision rotator cuff repair, subacromial decompression;  Surgeon: Jene Every, MD;  Location: [facility] ORS;  Service: Orthopedics;  Laterality: Right;   TOTAL HIP ARTHROPLASTY Right 04/24/2017   Procedure: RIGHT TOTAL HIP ARTHROPLASTY ANTERIOR APPROACH;   Surgeon: Samson Frederic, MD;  Location: [facility] ORS;  Service: Orthopedics;  Laterality: Right;  Needs RNFA   TRANSCATHETER AORTIC VALVE REPLACEMENT, TRANSFEMORAL N/A 11/13/2021   Procedure: Transcatheter Aortic Valve Replacement, Transfemoral Edwards 23 MM SAPIEN 3 Ultra;  Surgeon: Tonny Bollman, MD;  Location: [facility] OR;  Service: Open Heart Surgery;  Laterality: N/A;  Transfemoral approach    Family History  Problem Relation Age of Onset   Heart attack Mother    Stroke Father    Heart attack Father    Stroke Sister    Stroke Brother    Diabetes Brother    Dementia Brother    Heart disease Brother    Stroke Sister        TIA   Social History:  reports that she has never smoked. She has never used smokeless tobacco. She reports that she does not currently use alcohol. She reports that she does not use drugs.  Allergies:  Allergies  Allergen Reactions   Other Other (See Comments) and Rash    Tomato sauce, garlic, onion - severe acid reflux    Flexeril [Cyclobenzaprine] Other (See Comments)    Per spouse "she felt like a zombie"   Codeine Nausea Only and Rash   Librax [Chlordiazepoxide-Clidinium] Other (See Comments)    Dizziness (intolerance)   Lipitor [Atorvastatin] Other (See Comments)    Unbalanced    Medications Prior to Admission  Medication Sig Dispense Refill   amLODipine (NORVASC) 5 MG tablet TAKE 1 AND 1/2 TABLETS BY MOUTH DAILY 135 tablet 2   aspirin 81 MG chewable tablet Chew 1 tablet (81 mg total) by mouth daily.     Calcium Carbonate-Vit D-Min (CALCIUM 600+D PLUS MINERALS PO) Take 2 tablets by mouth daily. Chewable     escitalopram (LEXAPRO) 20 MG tablet Take 1 tablet (20 mg total) by mouth daily. 90 tablet 2   esomeprazole (NEXIUM) 40 MG capsule TAKE 1 CAPSULE BY MOUTH 2 TIMES A DAY BEFORE A MEAL 180 capsule 0   ezetimibe (ZETIA) 10 MG tablet TAKE 1 TABLET BY MOUTH DAILY 90 tablet 2   FIBER PO Take 10 g by mouth daily.     fluconazole (DIFLUCAN) 150 MG tablet Take 1  tablet (150 mg total) by mouth every 3 (three) days. 2 tablet 0   GINKGO BILOBA PO Take 1 tablet by mouth in the morning.     levothyroxine (SYNTHROID) 75 MCG tablet TAKE 1 TABLET BY MOUTH DAILY WITH BREAKFAST 90 tablet 2   Multiple Vitamins-Minerals (WOMENS MULTI GUMMIES PO) Take 2 tablets by mouth daily.     oxybutynin (DITROPAN XL) 15 MG 24 hr tablet TAKE 1 TABLET BY MOUTH AT BEDTIME 90 tablet 1   oxyCODONE-acetaminophen (PERCOCET/ROXICET) 5-325 MG tablet Take 1 tablet by mouth 4 (four) times daily as needed.     Probiotic Product (PROBIOTIC PO) Take 2 capsules by mouth daily.     rosuvastatin (CRESTOR) 10 MG tablet TAKE 1 TABLET BY MOUTH DAILY 90 tablet 2   traZODone (DESYREL) 50 MG tablet TAKE 1/2  TABLET BY MOUTH DAILY AT BEDTIME 45 tablet 1   glucose blood test strip daily.      Results for orders placed or performed during the hospital encounter of 05/09/23 (from the past 48 hours)  Glucose, capillary     Status: Abnormal   Collection Time: 05/09/23  6:23 AM  Result Value Ref Range   Glucose-Capillary 117 (H) 70 - 99 mg/dL    Comment: Glucose reference range applies only to samples taken after fasting for at least 8 hours.   Comment 1 Notify RN    No results found.  Pertinent items noted in HPI and remainder of comprehensive ROS otherwise negative.  Blood pressure 138/72, pulse (!) 56, temperature 97.7 F (36.5 C), temperature source Oral, resp. rate 18, height 5\' 3"  (1.6 m), weight 60.8 kg, SpO2 95%.  Patient is awake and alert.  She is oriented and appropriate.  Speech is fluent.  Judgment insight are intact.  Cranial nerve function normal bilateral.  Motor examination reveals intact motor strength bilaterally sensory examination nonfocal.  Deep tendon reflexes hypoactive but symmetric.  No evidence of long track signs.  Examination head ears eyes nose and throat is unremarkable chest and abdomen are benign.  Extremities are free from injury deformity. Assessment/Plan Loosened  left L5 pedicle screw with lateral migration.  Plan removal of left L5 pedicle screw.  Risks and benefits been explained.  Patient wishes proceed.  Cheryl Cooper 05/09/2023, 7:47 AM

## 2023-05-09 NOTE — Transfer of Care (Signed)
 Immediate Anesthesia Transfer of Care Note  Patient: Cheryl Cooper  Procedure(s) Performed: Removal of left Lumbar five pedicle screw (Left: Back)  Patient Location: PACU  Anesthesia Type:General  Level of Consciousness: drowsy and patient cooperative  Airway & Oxygen Therapy: Patient Spontanous Breathing and Patient connected to face mask oxygen  Post-op Assessment: Report given to RN and Post -op Vital signs reviewed and stable  Post vital signs: Reviewed and stable  Last Vitals:  Vitals Value Taken Time  BP 145/74 05/09/23 0915  Temp 36.4 C 05/09/23 0915  Pulse 59 05/09/23 0919  Resp 16 05/09/23 0919  SpO2 97 % 05/09/23 0919  Vitals shown include unfiled device data.  Last Pain:  Vitals:   05/09/23 0650  TempSrc:   PainSc: 0-No pain         Complications: There were no known notable events for this encounter.

## 2023-05-09 NOTE — Plan of Care (Signed)

## 2023-05-09 NOTE — Anesthesia Postprocedure Evaluation (Signed)
 Anesthesia Post Note  Patient: Cheryl Cooper  Procedure(s) Performed: Removal of left Lumbar five pedicle screw (Left: Back)     Patient location during evaluation: PACU Anesthesia Type: General Level of consciousness: awake and alert Pain management: pain level controlled Vital Signs Assessment: post-procedure vital signs reviewed and stable Respiratory status: spontaneous breathing, nonlabored ventilation, respiratory function stable and patient connected to nasal cannula oxygen Cardiovascular status: blood pressure returned to baseline and stable Postop Assessment: no apparent nausea or vomiting Anesthetic complications: no  There were no known notable events for this encounter.  Last Vitals:  Vitals:   05/09/23 0945 05/09/23 1015  BP: 136/75 132/62  Pulse: (!) 59 (!) 59  Resp: 13 16  Temp: 36.7 C 36.4 C  SpO2: 92% 96%    Last Pain:  Vitals:   05/09/23 1327  TempSrc:   PainSc: 6                  Cullin Dishman L Korra Christine

## 2023-05-09 NOTE — Op Note (Signed)
 Date of procedure: 05/09/2023  Date of dictation: Same  Service: Neurosurgery  Preoperative diagnosis: Painful orthopedic hardware, left L5 pedicle screw  Postoperative diagnosis: Same  Procedure Name: Reexploration of lumbar fusion with removal of painful hardware  Surgeon:Pascal Stiggers A.Adi Doro, M.D.  Asst. Surgeon: Doran Durand, NP  Anesthesia: General  Indication: 83 year old female remotely status post L3-4 decompression and fusion and status post L4-5 decompression and fusion more recently.  Patient has been progressing reasonably well.  She suffered a recent fall and has had some increasing left-sided back pain.  Workup demonstrates good appearance for fusion at L3-4 and what appears to be solid arthrodesis at L4-5 however her left-sided L5 pedicle screw has loosened and kicked out laterally.  I believe this is causing her discomfort and I have recommended removing this loosened screw.  Hardware on the right at L4-5 appears intact.  Operative note: After induction anesthesia, patient position prone onto Wilson frame appropriate padded.  Lumbar region prepped and draped sterilely.  Incision made over the left L5 pedicle screw confirmed by fluoroscopy.  Dissection performed.  Medially exposing the screw and the surrounding rod at L5.  The proximal aspect of the rod was cut with a titanium metal cutting bit.  The screw was disassembled the rod was removed.  The screw was then removed.  There was no evidence of any other complicating features.  The wound was irrigated.  Is then closed in layers.  No apparent complications.  Patient tolerated procedure well and she returns to the recovery room postop.

## 2023-05-09 NOTE — Discharge Instructions (Signed)
 Wound Care Keep incision covered and dry until post op day 3. You may remove the Honeycomb dressing on post op day 3. Leave steri-strips on back.  They will fall off by themselves. Do not put any creams, lotions, or ointments on incision. You are fine to shower. Let water run over incision and pat dry.   Activity Walk each and every day, increasing distance each day. No lifting greater than 8 lbs.  No lifting no bending no twisting no driving, you can ride as a passenger locally   Diet Resume your normal diet.    Call Your Doctor If Any of These Occur Redness, drainage, or swelling at the wound.  Temperature greater than 101 degrees. Severe pain not relieved by pain medication. Incision starts to come apart.  Follow Up Appt Call [phone] if you have one or any problem.

## 2023-05-09 NOTE — Brief Op Note (Signed)
 05/09/2023  8:50 AM  PATIENT:  Cheryl Cooper  83 y.o. female  PRE-OPERATIVE DIAGNOSIS:  Painful hardware  POST-OPERATIVE DIAGNOSIS:  Painful hardware  PROCEDURE:  Procedure(s): Removal of left Lumbar five pedicle screw (Left)  SURGEON:  Surgeons and Role:    Julio Sicks, MD - Primary  PHYSICIAN ASSISTANT:   ASSISTANTSMarland Mcalpine   ANESTHESIA:   general  EBL:  Minimal   BLOOD ADMINISTERED:none  DRAINS: none   LOCAL MEDICATIONS USED:  MARCAINE     SPECIMEN:  No Specimen  DISPOSITION OF SPECIMEN:  N/A  COUNTS:  YES  TOURNIQUET:  * No tourniquets in log *  DICTATION: .Dragon Dictation  PLAN OF CARE: Admit for overnight observation  PATIENT DISPOSITION:  PACU - hemodynamically stable.   Delay start of Pharmacological VTE agent (>24hrs) due to surgical blood loss or risk of bleeding: yes

## 2023-05-09 NOTE — Anesthesia Procedure Notes (Signed)
 Procedure Name: Intubation Date/Time: 05/09/2023 8:10 AM  Performed by: Jeanee Fabre C, CRNAPre-anesthesia Checklist: Patient identified, Emergency Drugs available, Suction available and Patient being monitored Patient Re-evaluated:Patient Re-evaluated prior to induction Oxygen Delivery Method: Circle system utilized Preoxygenation: Pre-oxygenation with 100% oxygen Induction Type: IV induction Ventilation: Mask ventilation without difficulty Laryngoscope Size: Glidescope and 3 Grade View: Grade I Tube type: Oral Tube size: 7.0 mm Number of attempts: 1 Airway Equipment and Method: Stylet and Oral airway Placement Confirmation: ETT inserted through vocal cords under direct vision, positive ETCO2 and breath sounds checked- equal and bilateral Tube secured with: Tape Dental Injury: Teeth and Oropharynx as per pre-operative assessment

## 2023-05-10 MED ORDER — TIZANIDINE HCL 4 MG PO TABS: 4.0000 mg | ORAL_TABLET | Freq: Four times a day (QID) | ORAL | 0 refills | Status: AC | PRN

## 2023-05-10 NOTE — Progress Notes (Signed)
Patient is discharged from room 3C08 at this time. Alert and in stable condition. IV site d/c'd and instructions read to patient with understanding verbalized and all questions answered. Left unit via wheelchair with all belongings at side. 

## 2023-05-10 NOTE — Discharge Summary (Signed)
 Physician Discharge Summary  Patient ID: PRISEIS CRATTY MRN: [id number] DOB/AGE: 05-30-1940 84 y.o.  Admit date: 05/09/2023 Discharge date: 05/10/2023  Admission Diagnoses:painful hardware, L5 pedicle screw  Discharge Diagnoses:  Principal Problem:   Painful orthopaedic hardware [facility])   Discharged Condition: good  Hospital Course: Mrs. Cheryl Cooper was admitted and taken to the operating room for removal of the left L5 pedicle screw. Post op she is ambulating, voiding, and tolerating a regular diet. The wound is clean, dry, and without signs of infection.   Treatments: surgery: as above  Discharge Exam: Blood pressure (!) 147/63, pulse 65, temperature 97.9 F (36.6 C), temperature source Oral, resp. rate 20, height 5\' 3"  (1.6 m), weight 60.8 kg, SpO2 95%. General appearance: alert, cooperative, appears stated age, and no distress  Disposition: Discharge disposition: 01-Home or Self Care      Painful hardware  Allergies as of 05/10/2023       Reactions   Other Other (See Comments), Rash   Tomato sauce, garlic, onion - severe acid reflux    Flexeril [cyclobenzaprine] Other (See Comments)   Per spouse "she felt like a zombie"   Codeine Nausea Only, Rash   Librax [chlordiazepoxide-clidinium] Other (See Comments)   Dizziness (intolerance)   Lipitor [atorvastatin] Other (See Comments)   Unbalanced        Medication List     STOP taking these medications    FIBER PO       TAKE these medications    amLODipine 5 MG tablet Commonly known as: NORVASC TAKE 1 AND 1/2 TABLETS BY MOUTH DAILY   aspirin 81 MG chewable tablet Chew 1 tablet (81 mg total) by mouth daily.   CALCIUM 600+D PLUS MINERALS PO Take 2 tablets by mouth daily. Chewable   escitalopram 20 MG tablet Commonly known as: LEXAPRO Take 1 tablet (20 mg total) by mouth daily.   esomeprazole 40 MG capsule Commonly known as: NEXIUM TAKE 1 CAPSULE BY MOUTH 2 TIMES A DAY BEFORE A MEAL   ezetimibe 10 MG  tablet Commonly known as: ZETIA TAKE 1 TABLET BY MOUTH DAILY   fluconazole 150 MG tablet Commonly known as: DIFLUCAN Take 1 tablet (150 mg total) by mouth every 3 (three) days.   GINKGO BILOBA PO Take 1 tablet by mouth in the morning.   glucose blood test strip daily.   levothyroxine 75 MCG tablet Commonly known as: SYNTHROID TAKE 1 TABLET BY MOUTH DAILY WITH BREAKFAST   oxybutynin 15 MG 24 hr tablet Commonly known as: DITROPAN XL TAKE 1 TABLET BY MOUTH AT BEDTIME   oxyCODONE-acetaminophen 5-325 MG tablet Commonly known as: PERCOCET/ROXICET Take 1 tablet by mouth 4 (four) times daily as needed.   PROBIOTIC PO Take 2 capsules by mouth daily.   rosuvastatin 10 MG tablet Commonly known as: CRESTOR TAKE 1 TABLET BY MOUTH DAILY   tiZANidine 4 MG tablet Commonly known as: ZANAFLEX Take 1 tablet (4 mg total) by mouth every 6 (six) hours as needed for muscle spasms.   traZODone 50 MG tablet Commonly known as: DESYREL TAKE 1/2 TABLET BY MOUTH DAILY AT BEDTIME   WOMENS MULTI GUMMIES PO Take 2 tablets by mouth daily.        Follow-up Information     Julio Sicks, MD. Call.   Specialty: Neurosurgery Why: As needed, If symptoms worsen Contact information: [address] [place] [address] [phone]                 Signed:  Coletta Memos 05/10/2023, 10:15 AM

## 2023-05-10 NOTE — Evaluation (Signed)
 Occupational Therapy Evaluation Patient Details Name: ALYSAH CARTON MRN: [id number] DOB: [birth year] Today's Date: 05/10/2023   History of Present Illness   Patient is a 83 yo woman admitted for s/p L L5 pedicle screw removal from previous L3-4 decomp and fusion.  PMHx: CVA, carpal tunnel, liver cirrhosis, OA, anxiety, DM, depression, HTN, HLD, TAVR, deaf L ear     Clinical Impressions Patient lives with husband in 1 level home with basement and 2 STE. Independent in ADLs and IADLs and goes to aerobics class 2 x week.  Uses rollator at baseline and reports falls at home  Patient educated on using RW for overall increased safety when feeling unsteady  Patient educated on overall home safety such as removing scatter rugs to prevent falls at home.  Patient and husband verbalized understanding.  Patient completed sit to stand and functional mobility with RW with CGA/Supervision and SBA for LB ADLs for threading garments distally. Patient to return home with A from family.     If plan is discharge home, recommend the following:   Assist for transportation;Help with stairs or ramp for entrance;Assistance with cooking/housework;A little help with bathing/dressing/bathroom     Functional Status Assessment   Patient has had a recent decline in their functional status and demonstrates the ability to make significant improvements in function in a reasonable and predictable amount of time.     Equipment Recommendations         Recommendations for Other Services         Precautions/Restrictions   Restrictions Weight Bearing Restrictions Per Provider Order: No     Mobility Bed Mobility Overal bed mobility:  (sitting EOB upoin arrival to room)                  Transfers Overall transfer level: Needs assistance Equipment used: Rolling walker (2 wheels) Transfers: Sit to/from Stand Sit to Stand: Contact guard assist                  Balance Overall balance  assessment: Modified Independent                                         ADL either performed or assessed with clinical judgement   ADL Overall ADL's : Needs assistance/impaired Eating/Feeding: Independent;Sitting   Grooming: Wash/dry hands;Wash/dry face;Contact guard assist   Upper Body Bathing: Sitting;Independent   Lower Body Bathing: Minimal assistance   Upper Body Dressing : Independent;Sitting   Lower Body Dressing: Minimal assistance   Toilet Transfer: Contact guard assist   Toileting- Clothing Manipulation and Hygiene: Contact guard assist       Functional mobility during ADLs: Contact guard assist;Rolling walker (2 wheels)       Vision Baseline Vision/History: 1 Wears glasses Ability to See in Adequate Light: 0 Adequate Patient Visual Report: No change from baseline Vision Assessment?: No apparent visual deficits     Perception         Praxis         Pertinent Vitals/Pain       Extremity/Trunk Assessment         Cervical / Trunk Assessment Cervical / Trunk Assessment: Normal   Communication Communication Communication: No apparent difficulties   Cognition Arousal: Alert Behavior During Therapy: WFL for tasks assessed/performed Cognition: No apparent impairments  Following commands: Intact       Cueing  General Comments   Cueing Techniques: Verbal cues      Exercises     Shoulder Instructions      Home Living Family/patient expects to be discharged to:: Private residence Living Arrangements: Spouse/significant other Available Help at Discharge: Family;Available 24 hours/day Type of Home: House Home Access: Stairs to enter [employer] of Steps: 2 Entrance Stairs-Rails: Left Home Layout: One level     Bathroom Shower/Tub: Producer, television/film/video: Handicapped height Bathroom Accessibility: Yes How Accessible: Accessible via walker Home  Equipment: Shower seat - built Char[employer] fundraiser (2 wheels);Rollator (4 wheels);Cane - quad;BSC/3in1          Prior Functioning/Environment Prior Level of Function : Independent/Modified Independent;History of Falls (last six months)             Mobility Comments: rollator for ambulation ADLs Comments: Pt reports that prior to her first surgery and afterwards, she required assist with LB dressing/bathing PRN d/t back pain.    OT Problem List: Decreased strength;Decreased activity tolerance;Decreased safety awareness   OT Treatment/Interventions:        OT Goals(Current goals can be found in the care plan section)   Acute Rehab OT Goals OT Goal Formulation: With patient Time For Goal Achievement: 05/10/23 Potential to Achieve Goals: Good   OT Frequency:       Co-evaluation              AM-PAC OT "6 Clicks" Daily Activity     Outcome Measure Help from another person eating meals?: None Help from another person taking care of personal grooming?: None Help from another person toileting, which includes using toliet, bedpan, or urinal?: A Little Help from another person bathing (including washing, rinsing, drying)?: A Little Help from another person to put on and taking off regular upper body clothing?: None Help from another person to put on and taking off regular lower body clothing?: A Little 6 Click Score: 21   End of Session Equipment Utilized During Treatment: Rolling walker (2 wheels) Nurse Communication: Mobility status  Activity Tolerance: Patient tolerated treatment well Patient left: in chair;with call bell/phone within reach  OT Visit Diagnosis: Unsteadiness on feet (R26.81);Muscle weakness (generalized) (M62.81)                Time:  -    Charges:     Governor Specking OT/L  Denice Paradise 05/10/2023, 4:08 PM

## 2023-05-12 ENCOUNTER — Other Ambulatory Visit: Payer: Self-pay | Admitting: Family Medicine

## 2023-05-12 ENCOUNTER — Telehealth: Payer: Self-pay

## 2023-05-12 ENCOUNTER — Encounter (HOSPITAL_COMMUNITY): Payer: Self-pay | Admitting: Neurosurgery

## 2023-05-12 MED ORDER — OXYBUTYNIN CHLORIDE ER 15 MG PO TB24: 15.0000 mg | ORAL_TABLET | Freq: Every day | ORAL | 1 refills | Status: DC

## 2023-05-12 NOTE — Transitions of Care (Post Inpatient/ED Visit) (Signed)
   05/12/2023  Name: DANYELA POSAS MRN: [id number] DOB: 1940-03-21  Today's TOC FU Call Status: Today's TOC FU Call Status:: Unsuccessful Call (1st Attempt) Unsuccessful Call (1st Attempt) Date: 05/12/23  Attempted to reach the patient regarding the most recent Inpatient/ED visit.  Follow Up Plan: Additional outreach attempts will be made to reach the patient to complete the Transitions of Care (Post Inpatient/ED visit) call.   Lonia Chimera, RN, BSN, CEN [employer]- Transition of Care Team.  [facility] [phone]

## 2023-05-12 NOTE — Telephone Encounter (Signed)
 Called patient and scheduled an appt on 4/22@ 10:00 am.   Refill sent to the pharmacy for her. Dm/cma

## 2023-05-12 NOTE — Telephone Encounter (Signed)
 Copied from CRM [id number]. Topic: Clinical - Prescription Issue >> May 12, 2023 11:46 AM Eunice Blase wrote: Reason for CRM: Pt called stated her medication for frequent bladder control was denied and would like to know why. Please call pt at [phone].

## 2023-05-13 ENCOUNTER — Telehealth: Payer: Self-pay | Admitting: *Deleted

## 2023-05-13 NOTE — Transitions of Care (Post Inpatient/ED Visit) (Signed)
   05/13/2023  Name: Cheryl Cooper MRN: [id number] DOB: Feb 10, 1941  Today's TOC FU Call Status: Today's TOC FU Call Status:: Unsuccessful Call (2nd Attempt) Unsuccessful Call (2nd Attempt) Date: 05/13/23  Attempted to reach the patient regarding the most recent Inpatient/ED visit.  Follow Up Plan: Additional outreach attempts will be made to reach the patient to complete the Transitions of Care (Post Inpatient/ED visit) call.   Gean Maidens BSN RN [facility] Health Care Management Coordinator [email or web address] Direct Dial: [phone]  Fax: [phone] Website: [email or web address]

## 2023-05-14 ENCOUNTER — Telehealth: Payer: Self-pay | Admitting: *Deleted

## 2023-05-14 NOTE — Transitions of Care (Post Inpatient/ED Visit) (Signed)
   05/14/2023  Name: AZORIA ABBETT MRN: [id number] DOB: August 14, 1940  Today's TOC FU Call Status: Today's TOC FU Call Status:: Unsuccessful Call (3rd Attempt) Unsuccessful Call (3rd Attempt) Date: 05/14/23  Attempted to reach the patient regarding the most recent Inpatient/ED visit.  Follow Up Plan: No further outreach attempts will be made at this time. We have been unable to contact the patient.  Gean Maidens BSN RN [facility] Health Care Management Coordinator [email or web address] Direct Dial: [phone]  Fax: [phone] Website: [email or web address]

## 2023-05-19 ENCOUNTER — Other Ambulatory Visit: Payer: Self-pay | Admitting: Family Medicine

## 2023-05-19 DIAGNOSIS — E039 Hypothyroidism, unspecified: Secondary | ICD-10-CM

## 2023-05-26 ENCOUNTER — Other Ambulatory Visit: Payer: Self-pay | Admitting: Family Medicine

## 2023-05-26 DIAGNOSIS — F32A Depression, unspecified: Secondary | ICD-10-CM

## 2023-06-02 ENCOUNTER — Other Ambulatory Visit: Payer: Self-pay | Admitting: Family Medicine

## 2023-06-02 DIAGNOSIS — F32A Depression, unspecified: Secondary | ICD-10-CM

## 2023-06-10 ENCOUNTER — Ambulatory Visit: Admitting: Family Medicine

## 2023-06-10 ENCOUNTER — Encounter: Payer: Self-pay | Admitting: Family Medicine

## 2023-06-10 VITALS — BP 110/66 | HR 60 | Temp 97.8°F | Ht 63.0 in | Wt 136.6 lb

## 2023-06-10 DIAGNOSIS — G4700 Insomnia, unspecified: Secondary | ICD-10-CM

## 2023-06-10 DIAGNOSIS — F32A Depression, unspecified: Secondary | ICD-10-CM | POA: Diagnosis not present

## 2023-06-10 DIAGNOSIS — I10 Essential (primary) hypertension: Secondary | ICD-10-CM | POA: Diagnosis not present

## 2023-06-10 DIAGNOSIS — E7800 Pure hypercholesterolemia, unspecified: Secondary | ICD-10-CM

## 2023-06-10 LAB — CBC
HCT: 40.7 % (ref 36.0–46.0)
Hemoglobin: 13.5 g/dL (ref 12.0–15.0)
MCHC: 33.3 g/dL (ref 30.0–36.0)
MCV: 90.5 fl (ref 78.0–100.0)
Platelets: 143 10*3/uL — ABNORMAL LOW (ref 150.0–400.0)
RBC: 4.49 Mil/uL (ref 3.87–5.11)
RDW: 14.2 % (ref 11.5–15.5)
WBC: 6.2 10*3/uL (ref 4.0–10.5)

## 2023-06-10 LAB — BASIC METABOLIC PANEL WITH GFR
BUN: 13 mg/dL (ref 6–23)
CO2: 30 meq/L (ref 19–32)
Calcium: 9.5 mg/dL (ref 8.4–10.5)
Chloride: 103 meq/L (ref 96–112)
Creatinine, Ser: 1 mg/dL (ref 0.40–1.20)
GFR: 53.04 mL/min — ABNORMAL LOW (ref >60.00)
Glucose, Bld: 100 mg/dL — ABNORMAL HIGH (ref 70–99)
Potassium: 3.9 meq/L (ref 3.5–5.1)
Sodium: 139 meq/L (ref 135–145)

## 2023-06-10 LAB — LDL CHOLESTEROL, DIRECT: Direct LDL: 66 mg/dL

## 2023-06-10 NOTE — Progress Notes (Signed)
 Established Patient Office Visit   Subjective:  Patient ID: Cheryl Cooper, female    DOB: 05-19-1940  Age: 83 y.o. MRN: [id number]  Chief Complaint  Patient presents with   Medical Management of Chronic Issues    6 month follow up. Pt is not fasting. No concerns.     HPI Encounter Diagnoses  Name Primary?   Depression, unspecified depression type Yes   Elevated LDL cholesterol level    Insomnia, unspecified type    Essential hypertension    For follow-up of above.  Continues amlodipine  for hypertension.  Continues simvastatin  and Zetia  for elevated cholesterol.  Continues see escitalopram  for depression with good effect.  Continues trazodone  for insomnia.  Continues follow-up with endocrinology for hypothyroidism and diet-controlled type 2 diabetes.  She lives with her husband and small dog.   Review of Systems  Constitutional: Negative.   HENT: Negative.    Eyes:  Negative for blurred vision, discharge and redness.  Respiratory: Negative.    Cardiovascular: Negative.   Gastrointestinal:  Negative for abdominal pain.  Genitourinary: Negative.   Musculoskeletal: Negative.  Negative for myalgias.  Skin:  Negative for rash.  Neurological:  Negative for tingling, loss of consciousness and weakness.  Endo/Heme/Allergies:  Negative for polydipsia.      06/10/2023   10:30 AM 12/02/2022   10:11 AM 04/09/2022   10:59 AM  Depression screen PHQ 2/9  Decreased Interest 1 0 0  Down, Depressed, Hopeless 1 0 0  PHQ - 2 Score 2 0 0  Altered sleeping 1    Tired, decreased energy 1    Change in appetite 0    Feeling bad or failure about yourself  2    Trouble concentrating 0    Moving slowly or fidgety/restless 0    Suicidal thoughts 0    PHQ-9 Score 6    Difficult doing work/chores Not difficult at all         Current Outpatient Medications:    amLODipine  (NORVASC ) 5 MG tablet, TAKE 1 AND 1/2 TABLETS BY MOUTH DAILY, Disp: 135 tablet, Rfl: 2   aspirin  81 MG chewable tablet,  Chew 1 tablet (81 mg total) by mouth daily., Disp: , Rfl:    Calcium  Carbonate-Vit D-Min (CALCIUM  600+D PLUS MINERALS PO), Take 2 tablets by mouth daily. Chewable, Disp: , Rfl:    escitalopram  (LEXAPRO ) 20 MG tablet, TAKE 1 TABLET BY MOUTH DAILY, Disp: 90 tablet, Rfl: 2   esomeprazole  (NEXIUM ) 40 MG capsule, TAKE 1 CAPSULE BY MOUTH 2 TIMES A DAY BEFORE A MEAL, Disp: 180 capsule, Rfl: 0   ezetimibe  (ZETIA ) 10 MG tablet, TAKE 1 TABLET BY MOUTH DAILY, Disp: 90 tablet, Rfl: 2   GINKGO BILOBA PO, Take 1 tablet by mouth in the morning., Disp: , Rfl:    glucose blood test strip, daily., Disp: , Rfl:    levothyroxine  (SYNTHROID ) 75 MCG tablet, TAKE 1 TABLET BY MOUTH DAILY WITH BREAKFAST, Disp: 90 tablet, Rfl: 2   Multiple Vitamins-Minerals (WOMENS MULTI GUMMIES PO), Take 2 tablets by mouth daily., Disp: , Rfl:    oxybutynin  (DITROPAN  XL) 15 MG 24 hr tablet, Take 1 tablet (15 mg total) by mouth at bedtime., Disp: 90 tablet, Rfl: 1   Probiotic Product (PROBIOTIC PO), Take 2 capsules by mouth daily., Disp: , Rfl:    rosuvastatin  (CRESTOR ) 10 MG tablet, TAKE 1 TABLET BY MOUTH DAILY, Disp: 90 tablet, Rfl: 2   tiZANidine  (ZANAFLEX ) 4 MG tablet, Take 1 tablet (4 mg total) by  mouth every 6 (six) hours as needed for muscle spasms., Disp: 60 tablet, Rfl: 0   traZODone  (DESYREL ) 50 MG tablet, TAKE 1/2 TABLET BY MOUTH DAILY AT BEDTIME, Disp: 45 tablet, Rfl: 1   fluconazole  (DIFLUCAN ) 150 MG tablet, Take 1 tablet (150 mg total) by mouth every 3 (three) days. (Patient not taking: Reported on 06/10/2023), Disp: 2 tablet, Rfl: 0   oxyCODONE -acetaminophen  (PERCOCET/ROXICET) 5-325 MG tablet, Take 1 tablet by mouth 4 (four) times daily as needed. (Patient not taking: Reported on 06/10/2023), Disp: , Rfl:    Objective:     BP 110/66 (BP Location: Left Arm, Patient Position: Sitting, Cuff Size: Normal)   Pulse 60   Temp 97.8 F (36.6 C) (Temporal)   Ht 5\' 3"  (1.6 m)   Wt 136 lb 9.6 oz (62 kg)   SpO2 96%   BMI 24.20  kg/m    Physical Exam Constitutional:      General: She is not in acute distress.    Appearance: Normal appearance. She is not ill-appearing, toxic-appearing or diaphoretic.  HENT:     Head: Normocephalic and atraumatic.     Right Ear: External ear normal.     Left Ear: External ear normal.  Eyes:     General: No scleral icterus.       Right eye: No discharge.        Left eye: No discharge.     Extraocular Movements: Extraocular movements intact.     Conjunctiva/sclera: Conjunctivae normal.  Cardiovascular:     Rate and Rhythm: Normal rate and regular rhythm.  Pulmonary:     Effort: Pulmonary effort is normal. No respiratory distress.     Breath sounds: No wheezing, rhonchi or rales.  Skin:    General: Skin is warm and dry.  Neurological:     Mental Status: She is alert and oriented to person, place, and time.  Psychiatric:        Mood and Affect: Mood normal.        Behavior: Behavior normal.      No results found for any visits on 06/10/23.    The ASCVD Risk score (Arnett DK, et al., 2019) failed to calculate for the following reasons:   The 2019 ASCVD risk score is only valid for ages 48 to 64   Risk score cannot be calculated because patient has a medical history suggesting prior/existing ASCVD    Assessment & Plan:   Depression, unspecified depression type  Elevated LDL cholesterol level -     LDL cholesterol, direct  Insomnia, unspecified type  Essential hypertension -     Basic metabolic panel with GFR -     CBC    Return in about 6 months (around 12/10/2023).  Continue all medications as above.  Tonna Frederic, MD

## 2023-07-04 ENCOUNTER — Other Ambulatory Visit: Payer: Self-pay | Admitting: Gastroenterology

## 2023-07-04 ENCOUNTER — Ambulatory Visit
Admission: RE | Admit: 2023-07-04 | Discharge: 2023-07-04 | Disposition: A | Discharge status: Home / Self Care | Source: Ambulatory Visit | Attending: Gastroenterology | Admitting: Gastroenterology

## 2023-07-04 DIAGNOSIS — K5900 Constipation, unspecified: Secondary | ICD-10-CM | POA: Diagnosis not present

## 2023-08-14 DIAGNOSIS — T8484XA Pain due to internal orthopedic prosthetic devices, implants and grafts, initial encounter: Secondary | ICD-10-CM | POA: Diagnosis not present

## 2023-08-26 DIAGNOSIS — M7989 Other specified soft tissue disorders: Secondary | ICD-10-CM | POA: Diagnosis not present

## 2023-08-26 DIAGNOSIS — M48061 Spinal stenosis, lumbar region without neurogenic claudication: Secondary | ICD-10-CM | POA: Diagnosis not present

## 2023-08-26 DIAGNOSIS — T8484XA Pain due to internal orthopedic prosthetic devices, implants and grafts, initial encounter: Secondary | ICD-10-CM | POA: Diagnosis not present

## 2023-08-26 DIAGNOSIS — M5137 Other intervertebral disc degeneration, lumbosacral region with discogenic back pain only: Secondary | ICD-10-CM | POA: Diagnosis not present

## 2023-08-26 DIAGNOSIS — M5126 Other intervertebral disc displacement, lumbar region: Secondary | ICD-10-CM | POA: Diagnosis not present

## 2023-08-31 ENCOUNTER — Other Ambulatory Visit: Payer: Self-pay | Admitting: Family Medicine

## 2023-09-04 ENCOUNTER — Other Ambulatory Visit: Payer: Self-pay

## 2023-09-04 DIAGNOSIS — K5900 Constipation, unspecified: Secondary | ICD-10-CM | POA: Diagnosis not present

## 2023-09-04 DIAGNOSIS — M549 Dorsalgia, unspecified: Secondary | ICD-10-CM | POA: Diagnosis not present

## 2023-09-04 DIAGNOSIS — R140 Abdominal distension (gaseous): Secondary | ICD-10-CM | POA: Diagnosis not present

## 2023-09-04 DIAGNOSIS — K7460 Unspecified cirrhosis of liver: Secondary | ICD-10-CM

## 2023-09-04 DIAGNOSIS — R1310 Dysphagia, unspecified: Secondary | ICD-10-CM

## 2023-09-11 DIAGNOSIS — M5416 Radiculopathy, lumbar region: Secondary | ICD-10-CM | POA: Diagnosis not present

## 2023-09-16 ENCOUNTER — Other Ambulatory Visit

## 2023-09-16 ENCOUNTER — Ambulatory Visit
Admission: RE | Admit: 2023-09-16 | Discharge: 2023-09-16 | Disposition: A | Discharge status: Home / Self Care | Source: Ambulatory Visit

## 2023-09-16 DIAGNOSIS — K7469 Other cirrhosis of liver: Secondary | ICD-10-CM | POA: Diagnosis not present

## 2023-09-16 DIAGNOSIS — K7460 Unspecified cirrhosis of liver: Secondary | ICD-10-CM

## 2023-09-16 DIAGNOSIS — Z9049 Acquired absence of other specified parts of digestive tract: Secondary | ICD-10-CM | POA: Diagnosis not present

## 2023-09-21 ENCOUNTER — Other Ambulatory Visit: Payer: Self-pay | Admitting: Family Medicine

## 2023-09-21 DIAGNOSIS — E785 Hyperlipidemia, unspecified: Secondary | ICD-10-CM

## 2023-09-23 ENCOUNTER — Telehealth: Payer: Self-pay | Admitting: Family Medicine

## 2023-09-23 DIAGNOSIS — I10 Essential (primary) hypertension: Secondary | ICD-10-CM | POA: Diagnosis not present

## 2023-09-23 DIAGNOSIS — E039 Hypothyroidism, unspecified: Secondary | ICD-10-CM | POA: Diagnosis not present

## 2023-09-23 DIAGNOSIS — M8589 Other specified disorders of bone density and structure, multiple sites: Secondary | ICD-10-CM | POA: Diagnosis not present

## 2023-09-23 DIAGNOSIS — E119 Type 2 diabetes mellitus without complications: Secondary | ICD-10-CM | POA: Diagnosis not present

## 2023-09-23 NOTE — Telephone Encounter (Signed)
 Prescription Request  09/23/2023  LOV: 06/10/2023  What is the name of the medication or equipment? oxybutynin  (DITROPAN  XL) 15 MG 24 hr tablet [[id number]]    Have you contacted your pharmacy to request a refill? No, She is wanting to take two pills before bedtime, one pill is not working for her. She only has a few pills left.   Which pharmacy would you like this sent to?  [facility] [id number] [place], [place] - [address] [place] [address] Phone: [phone] Fax: [phone]    Patient notified that their request is being sent to the clinical staff for review and that they should receive a response within 2 business days.   Please advise at Mobile [phone]

## 2023-09-25 ENCOUNTER — Other Ambulatory Visit: Payer: Self-pay | Admitting: Family Medicine

## 2023-09-25 ENCOUNTER — Ambulatory Visit
Admission: RE | Admit: 2023-09-25 | Discharge: 2023-09-25 | Disposition: A | Discharge status: Home / Self Care | Source: Ambulatory Visit

## 2023-09-25 DIAGNOSIS — R1310 Dysphagia, unspecified: Secondary | ICD-10-CM

## 2023-09-25 DIAGNOSIS — K219 Gastro-esophageal reflux disease without esophagitis: Secondary | ICD-10-CM | POA: Diagnosis not present

## 2023-09-26 ENCOUNTER — Other Ambulatory Visit: Payer: Self-pay | Admitting: Family Medicine

## 2023-09-26 NOTE — Telephone Encounter (Signed)
 Copied from CRM [id number]. Topic: Clinical - Medication Refill >> Sep 26, 2023  8:47 AM Pinkey ORN wrote: Medication: oxybutynin  (DITROPAN  XL) 15 MG 24 hr tablet  Has the patient contacted their pharmacy? Yes (Agent: If no, request that the patient contact the pharmacy for the refill. If patient does not wish to contact the pharmacy document the reason why and proceed with request.) (Agent: If yes, when and what did the pharmacy advise?)  This is the patient's preferred pharmacy:  [facility] [id number] [place], [place] - [address] [place] [address] Phone: [phone] Fax: [phone]  Is this the correct pharmacy for this prescription? Yes If no, delete pharmacy and type the correct one.   Has the prescription been filled recently? No  Is the patient out of the medication? Yes  Has the patient been seen for an appointment in the last year OR does the patient have an upcoming appointment? Yes  Can we respond through MyChart? Yes  Agent: Please be advised that Rx refills may take up to 3 business days. We ask that you follow-up with your pharmacy. >> Sep 26, 2023  8:49 AM Pinkey ORN wrote: Patient stays she needs more than the 15MG 

## 2023-09-29 ENCOUNTER — Other Ambulatory Visit: Payer: Self-pay

## 2023-09-29 ENCOUNTER — Ambulatory Visit: Attending: Student

## 2023-09-29 DIAGNOSIS — M4310 Spondylolisthesis, site unspecified: Secondary | ICD-10-CM | POA: Insufficient documentation

## 2023-09-29 DIAGNOSIS — R293 Abnormal posture: Secondary | ICD-10-CM | POA: Insufficient documentation

## 2023-09-29 DIAGNOSIS — R2681 Unsteadiness on feet: Secondary | ICD-10-CM | POA: Insufficient documentation

## 2023-09-29 DIAGNOSIS — R262 Difficulty in walking, not elsewhere classified: Secondary | ICD-10-CM | POA: Insufficient documentation

## 2023-09-29 DIAGNOSIS — M6281 Muscle weakness (generalized): Secondary | ICD-10-CM | POA: Insufficient documentation

## 2023-09-29 NOTE — Therapy (Signed)
 OUTPATIENT PHYSICAL THERAPY THORACOLUMBAR EVALUATION   Patient Name: Cheryl Cooper MRN: [id number] DOB:[birth year], 83 y.o., female Today's Date: 09/29/2023  END OF SESSION:  PT End of Session - 09/29/23 1710     Visit Number 1    Date for PT Re-Evaluation 12/22/23    Progress Note Due on Visit 10    PT Start Time 1600    PT Stop Time 1648    PT Time Calculation (min) 48 min    Activity Tolerance Patient tolerated treatment well    Behavior During Therapy [facility] for tasks assessed/performed          Past Medical History:  Diagnosis Date   Anxiety    Arthritis    Carpal tunnel syndrome    Cirrhosis of liver (HCC) 07/2020   Deafness in left ear    does not wear hearing aid   Depression    Diabetes mellitus    diet controlled - type 2, no meds   Diverticulitis    GERD (gastroesophageal reflux disease)    Hypercholesteremia    Hypertension    Hypothyroid    Memory loss    reports d/t concussion - mild   PONV (postoperative nausea and vomiting) yrs ago, none recent   Post concussion syndrome    S/P TAVR (transcatheter aortic valve replacement) 11/13/2021   s/p TAVR with a 23 mm Edwards S3UR via the TF approach by Dr. Wonda and Dr. Maryjane   Severe aortic stenosis    Stroke [facility])    Past Surgical History:  Procedure Laterality Date   ABDOMINAL HYSTERECTOMY  1986   ANTERIOR LATERAL LUMBAR FUSION WITH PERCUTANEOUS SCREW 1 LEVEL Left 01/25/2022   Procedure: ANTERIOR LATERAL LUMBAR FUSION WITH PERCUTANEOUS SCREW LUMBAR THREE-FOUR;  Surgeon: Louis Shove, MD;  Location: [facility] OR;  Service: Neurosurgery;  Laterality: Left;   BACK SURGERY  2010   lower   BIOPSY  01/12/2018   Procedure: BIOPSY;  Surgeon: Wilhelmenia Aloha Raddle., MD;  Location: [facility] ENDOSCOPY;  Service: Gastroenterology;;   CARDIAC CATHETERIZATION     CARPAL TUNNEL RELEASE Right 2023   CHOLECYSTECTOMY     ESOPHAGOGASTRODUODENOSCOPY (EGD) WITH PROPOFOL  N/A 01/12/2018   Procedure: ESOPHAGOGASTRODUODENOSCOPY (EGD)  WITH PROPOFOL ;  Surgeon: Wilhelmenia Aloha Raddle., MD;  Location: [facility] ENDOSCOPY;  Service: Gastroenterology;  Laterality: N/A;   EYE SURGERY Right    cataract   HARDWARE REMOVAL Left 05/09/2023   Procedure: Removal of left Lumbar five pedicle screw;  Surgeon: Louis Shove, MD;  Location: [facility] OR;  Service: Neurosurgery;  Laterality: Left;   INTRAOPERATIVE TRANSTHORACIC ECHOCARDIOGRAM N/A 11/13/2021   Procedure: INTRAOPERATIVE TRANSTHORACIC ECHOCARDIOGRAM;  Surgeon: Wonda Sharper, MD;  Location: [facility] OR;  Service: Open Heart Surgery;  Laterality: N/A;   left elobow surgery  2003   left rotator cuff  2001   LUMBAR LAMINECTOMY/DECOMPRESSION MICRODISCECTOMY Left 12/23/2017   Procedure: Laminectomy for facet/synovial cyst - left - Lumbar three-Lumbar four;  Surgeon: Louis Shove, MD;  Location: [facility] OR;  Service: Neurosurgery;  Laterality: Left;   LUMBAR SPINE SURGERY  08/12/2022   r foot surgery  1995   REVERSE SHOULDER ARTHROPLASTY Left 10/23/2018   Procedure: REVERSE TOTAL SHOULDER ARTHROPLASTY;  Surgeon: Kay Kemps, MD;  Location: [facility] ORS;  Service: Orthopedics;  Laterality: Left;  interscalene block   REVERSE SHOULDER ARTHROPLASTY Right 04/20/2021   Procedure: REVERSE SHOULDER ARTHROPLASTY;  Surgeon: Kay Kemps, MD;  Location: [facility] ORS;  Service: Orthopedics;  Laterality: Right;  with ISB   right hand surgery  2001  RIGHT HEART CATH AND CORONARY ANGIOGRAPHY N/A 10/24/2021   Procedure: RIGHT HEART CATH AND CORONARY ANGIOGRAPHY;  Surgeon: Wonda Sharper, MD;  [facility] INVASIVE CV LAB;  Service: Cardiovascular;  Laterality: N/A;   right index finger surgery  2009   SHOULDER OPEN ROTATOR CUFF REPAIR  11/21/2011   Procedure: ROTATOR CUFF REPAIR SHOULDER OPEN;  Surgeon: Reyes JAYSON Billing, MD;  Location: [facility] ORS;  Service: Orthopedics;  Laterality: Right;  with subacromial decompression   SHOULDER OPEN ROTATOR CUFF REPAIR Right 05/09/2016   Procedure: Right shoulder mini open revision rotator cuff  repair, subacromial decompression;  Surgeon: Reyes Billing, MD;  Location: [facility] ORS;  Service: Orthopedics;  Laterality: Right;   TOTAL HIP ARTHROPLASTY Right 04/24/2017   Procedure: RIGHT TOTAL HIP ARTHROPLASTY ANTERIOR APPROACH;  Surgeon: Fidel Rogue, MD;  Location: [facility] ORS;  Service: Orthopedics;  Laterality: Right;  Needs RNFA   TRANSCATHETER AORTIC VALVE REPLACEMENT, TRANSFEMORAL N/A 11/13/2021   Procedure: Transcatheter Aortic Valve Replacement, Transfemoral Edwards 23 MM SAPIEN 3 Ultra;  Surgeon: Wonda Sharper, MD;  Location: [facility] OR;  Service: Open Heart Surgery;  Laterality: N/A;  Transfemoral approach   Patient Active Problem List   Diagnosis Date Noted   Essential hypertension 12/02/2022   Lumbar adjacent segment disease with spondylolisthesis 08/13/2022   Degenerative spondylolisthesis 01/25/2022   S/P TAVR (transcatheter aortic valve replacement) 11/13/2021   Severe aortic stenosis    Urine frequency 07/26/2021   Depression 06/25/2021   S/P shoulder replacement, right 04/20/2021   Slow transit constipation 03/20/2021   Tick bite of abdomen 07/11/2020   Hyperlipidemia 06/22/2020   Type 2 diabetes, diet controlled (HCC) 06/22/2020   Chronic back pain 06/22/2020   Transaminitis 06/22/2020   PUD (peptic ulcer disease) 03/21/2018   Synovial cyst of lumbar facet joint 12/23/2017   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   Rotator cuff tear, right 11/21/2011    PCP: Berneta Fallow, MD  REFERRING PROVIDER: Jennetta Sayres, D,NP  REFERRING DIAG: unsteady gait, lumbar radiculopathy  Rationale for Evaluation and Treatment: Rehabilitation  THERAPY DIAG:  Difficulty in walking, not elsewhere classified  Unsteadiness on feet  Abnormal posture  Muscle weakness (generalized)  ONSET DATE: 2 years,   SUBJECTIVE:                                                                                                                                                                                            SUBJECTIVE STATEMENT: Reports that she has experienced multiple falls in the last year, recently slid off the bed, sometimes when trying to hook the leash to her dog  PERTINENT HISTORY:  LS fusion 2 years ago,  hardware loosened, had some hardware removed, pt states constant l sided lower back pain since that time  PAIN:  Are you having pain? Yes: NPRS scale: 8 Pain location: L SI region and across central lower back Pain description: aching , when she gets up during the night, intense pain Aggravating factors: hurts constantly, worse in am after sleeping Relieving factors: meds  PRECAUTIONS: Fall  RED FLAGS: None   WEIGHT BEARING RESTRICTIONS: No  FALLS:  Has patient fallen in last 6 months? Yes. Number of falls 4-6  LIVING ENVIRONMENT: Lives with: lives with their spouse Lives in: House/apartment Stairs: 2 outdoor steps  Has following equipment at home: None  OCCUPATION: retired   PLOF: Independent with household mobility with device and Needs assistance with homemaking  PATIENT GOALS: get balance better and reduce pain  NEXT MD VISIT: 6 weeks  OBJECTIVE:  Note: Objective measures were completed at Evaluation unless otherwise noted.  DIAGNOSTIC FINDINGS:  na  PATIENT SURVEYS:  Modified Oswestry Low Back Pain Disability Questionnaire: 28 / 50 = 56.0 %  COGNITION: Overall cognitive status: Within functional limits for tasks assessed     SENSATION: Pt reports wnl  MUSCLE LENGTH: Hamstrings: Right 15 deg; Left 15 deg Thomas test: Right nt deg; Left nt deg  POSTURE: L pelvic shift in standing, loss of gluteal mass B  PALPATION: Some tenderness L piriformis, non tender L gluteals  LUMBAR ROM:   AROM eval  Flexion 50%  Extension 20%  Right lateral flexion   Left lateral flexion   Right rotation   Left rotation    (Blank rows = not tested)  LOWER EXTREMITY ROM:   all wfl B LE's LOWER EXTREMITY MMT:    MMT Right eval Left eval   Hip flexion  4/5  Hip extension    Hip abduction  4/5  Hip adduction    Hip internal rotation    Hip external rotation    Knee flexion    Knee extension    Ankle dorsiflexion    Ankle plantarflexion    Ankle inversion    Ankle eversion     (Blank rows = not tested)  LUMBAR SPECIAL TESTS:    FUNCTIONAL TESTS:  5 times sit to stand: 20.4 sec  GAIT: Distance walked: in clinic today, wide base of support, using walls for support, arms in high guard, noted L hip with forward lurch,hyperextends L hip to advance R LE .  No device in clinic today although uses walker at home Assistive device utilized: none TREATMENT DATE: 09/29/23: evaluation, inst in seated hip abd/ER with green t -band    to perform 2 x day 15 reps . Education in POC, goals                                                                                                                             PATIENT EDUCATION:  Education details: POC, goals Person educated: Patient Education method: Explanation, Facilities manager, Actor  cues, and Verbal cues Education comprehension: verbalized understanding, returned demonstration, verbal cues required, and tactile cues required  HOME EXERCISE PROGRAM: TBD  ASSESSMENT:  CLINICAL IMPRESSION: Patient is a 83 y.o. female who was evaluated today by physical therapy for unsteady gait as well as L sided lumbar radiculopathy.  She reports a high pain level L post hip and across lower back, at 8/10 and reports nausea with the pain as well today throughout evaluation.  She does have some strength deficits L hip as well as some instability, poor coordination L LE as compared to R, noted with alt toe tapping in standing as well as with seated toe tapping. She scored 26/56 on Berg balance test which puts her in high risk category for falls as well as her time for 5 x sit to stand. She should benefit from skilled PT to address her balance and fall risk, emphasizing l hip strength and hopefully  some additional pain management techniques.   OBJECTIVE IMPAIRMENTS: decreased activity tolerance, decreased balance, decreased coordination, decreased endurance, decreased mobility, difficulty walking, decreased strength, decreased safety awareness, impaired perceived functional ability, postural dysfunction, and pain.   ACTIVITY LIMITATIONS: carrying, lifting, bending, standing, squatting, stairs, transfers, dressing, and locomotion level  PARTICIPATION LIMITATIONS: meal prep, cleaning, laundry, shopping, community activity, and yard work  PERSONAL FACTORS: Age, Behavior pattern, Past/current experiences, Time since onset of injury/illness/exacerbation, and 1-2 comorbidities: R THA, lumbar fusion with loosening and hardware removal are also affecting patient's functional outcome.   REHAB POTENTIAL: Good  CLINICAL DECISION MAKING: Evolving/moderate complexity  EVALUATION COMPLEXITY: Moderate   GOALS: Goals reviewed with patient? No  SHORT TERM GOALS: Target date: 2 weeks 10/13/23  I HEP Baseline: Goal status: INITIAL   LONG TERM GOALS: Target date: 12/22/23  Modified Oswestry Low Back Pain Disability Questionnaire: 28 / 50 = 56.0 % improve to 66% or greater Baseline:  Goal status: INITIAL  2.  BERG 26/56 improve to 35/56 or greater Baseline:  Goal status: INITIAL  3.  Improve L hip abd and flexion strength from 4/5 to 5/5  Baseline:  Goal status: INITIAL  4.  Improve 5 x sit to stand score to 15 sec or less from 20.4 Baseline:  Goal status: INITIAL  PLAN:  PT FREQUENCY: 2x/week  PT DURATION: 12 weeks  PLANNED INTERVENTIONS: 97110-Therapeutic exercises, 97530- Therapeutic activity, [id number]- Neuromuscular re-education, 97535- Self Care, 02859- Manual therapy, [phone]- Gait training, Patient/Family education, Balance training, and Stair training.  PLAN FOR NEXT SESSION: L hip strengthening emphasis, single leg or narrowed stance activities L LE   Tilton Marsalis L Geraldina Parrott, PT, DPT,  OCS 09/29/2023, 5:35 PM

## 2023-10-01 ENCOUNTER — Encounter: Payer: Self-pay | Admitting: Nurse Practitioner

## 2023-10-01 ENCOUNTER — Ambulatory Visit: Admitting: Nurse Practitioner

## 2023-10-01 VITALS — BP 110/74 | HR 60

## 2023-10-01 DIAGNOSIS — N952 Postmenopausal atrophic vaginitis: Secondary | ICD-10-CM

## 2023-10-01 MED ORDER — ESTRADIOL 0.1 MG/GM VA CREA: 1.0000 g | TOPICAL_CREAM | VAGINAL | 3 refills | Status: AC

## 2023-10-01 NOTE — Progress Notes (Signed)
   Cheryl Cooper February 09, 1941 [id number]   History:  83 y.o. [id number] presents for medication management. Using vaginal estrogen twice weekly. Ran out about a month ago and is having some vulvar discomfort without it.   Past medical history, past surgical history, family history and social history were all reviewed and documented in the EPIC chart.  ROS:  A ROS was performed and pertinent positives and negatives are included.  Exam:  Vitals:   10/01/23 1103  BP: 110/74  Pulse: 60  SpO2: 99%   There is no height or weight on file to calculate BMI.  General appearance: Normal  Assessment/Plan:  83 y.o. [id number] for medication management.   Vaginal atrophy - Plan: estradiol  (ESTRACE ) 0.1 MG/GM vaginal cream twice weekly.   Return in about 1 year (around 09/30/2024) for B&P.    Annabella DELENA Shutter DNP, 12:05 PM 10/01/2023

## 2023-10-03 NOTE — Therapy (Signed)
 OUTPATIENT PHYSICAL THERAPY THORACOLUMBAR TREATMENT   Patient Name: Cheryl Cooper MRN: [id number] DOB:1941-02-04, 83 y.o., female Today's Date: 10/06/2023  END OF SESSION:  PT End of Session - 10/06/23 1359     Visit Number 2    Date for PT Re-Evaluation 12/22/23    Progress Note Due on Visit 10    PT Start Time 1359    PT Stop Time 1445    PT Time Calculation (min) 46 min    Activity Tolerance Patient tolerated treatment well    Behavior During Therapy [facility] for tasks assessed/performed           Past Medical History:  Diagnosis Date   Anxiety    Arthritis    Carpal tunnel syndrome    Cirrhosis of liver (HCC) 07/2020   Deafness in left ear    does not wear hearing aid   Depression    Diabetes mellitus    diet controlled - type 2, no meds   Diverticulitis    GERD (gastroesophageal reflux disease)    Hypercholesteremia    Hypertension    Hypothyroid    Memory loss    reports d/t concussion - mild   PONV (postoperative nausea and vomiting) yrs ago, none recent   Post concussion syndrome    S/P TAVR (transcatheter aortic valve replacement) 11/13/2021   s/p TAVR with a 23 mm Edwards S3UR via the TF approach by Dr. Wonda and Dr. Maryjane   Severe aortic stenosis    Stroke [facility])    Past Surgical History:  Procedure Laterality Date   ABDOMINAL HYSTERECTOMY  1986   ANTERIOR LATERAL LUMBAR FUSION WITH PERCUTANEOUS SCREW 1 LEVEL Left 01/25/2022   Procedure: ANTERIOR LATERAL LUMBAR FUSION WITH PERCUTANEOUS SCREW LUMBAR THREE-FOUR;  Surgeon: Louis Shove, MD;  Location: [facility] OR;  Service: Neurosurgery;  Laterality: Left;   BACK SURGERY  2010   lower   BIOPSY  01/12/2018   Procedure: BIOPSY;  Surgeon: Wilhelmenia Aloha Raddle., MD;  Location: [facility];  Service: Gastroenterology;;   CARDIAC CATHETERIZATION     CARPAL TUNNEL RELEASE Right 2023   CHOLECYSTECTOMY     ESOPHAGOGASTRODUODENOSCOPY (EGD) WITH PROPOFOL  N/A 01/12/2018   Procedure: ESOPHAGOGASTRODUODENOSCOPY (EGD)  WITH PROPOFOL ;  Surgeon: Wilhelmenia Aloha Raddle., MD;  Location: [facility];  Service: Gastroenterology;  Laterality: N/A;   EYE SURGERY Right    cataract   HARDWARE REMOVAL Left 05/09/2023   Procedure: Removal of left Lumbar five pedicle screw;  Surgeon: Louis Shove, MD;  Location: [facility] OR;  Service: Neurosurgery;  Laterality: Left;   INTRAOPERATIVE TRANSTHORACIC ECHOCARDIOGRAM N/A 11/13/2021   Procedure: INTRAOPERATIVE TRANSTHORACIC ECHOCARDIOGRAM;  Surgeon: Wonda Sharper, MD;  Location: [facility] OR;  Service: Open Heart Surgery;  Laterality: N/A;   left elobow surgery  2003   left rotator cuff  2001   LUMBAR LAMINECTOMY/DECOMPRESSION MICRODISCECTOMY Left 12/23/2017   Procedure: Laminectomy for facet/synovial cyst - left - Lumbar three-Lumbar four;  Surgeon: Louis Shove, MD;  Location: [facility] OR;  Service: Neurosurgery;  Laterality: Left;   LUMBAR SPINE SURGERY  08/12/2022   r foot surgery  1995   REVERSE SHOULDER ARTHROPLASTY Left 10/23/2018   Procedure: REVERSE TOTAL SHOULDER ARTHROPLASTY;  Surgeon: Kay Kemps, MD;  Location: [facility];  Service: Orthopedics;  Laterality: Left;  interscalene block   REVERSE SHOULDER ARTHROPLASTY Right 04/20/2021   Procedure: REVERSE SHOULDER ARTHROPLASTY;  Surgeon: Kay Kemps, MD;  Location: [facility];  Service: Orthopedics;  Laterality: Right;  with ISB   right hand surgery  2001  RIGHT HEART CATH AND CORONARY ANGIOGRAPHY N/A 10/24/2021   Procedure: RIGHT HEART CATH AND CORONARY ANGIOGRAPHY;  Surgeon: Wonda Sharper, MD;  Location: [facility] INVASIVE CV LAB;  Service: Cardiovascular;  Laterality: N/A;   right index finger surgery  2009   SHOULDER OPEN ROTATOR CUFF REPAIR  11/21/2011   Procedure: ROTATOR CUFF REPAIR SHOULDER OPEN;  Surgeon: Reyes JAYSON Billing, MD;  Location: [facility];  Service: Orthopedics;  Laterality: Right;  with subacromial decompression   SHOULDER OPEN ROTATOR CUFF REPAIR Right 05/09/2016   Procedure: Right shoulder mini open revision rotator cuff  repair, subacromial decompression;  Surgeon: Reyes Billing, MD;  Location: [facility];  Service: Orthopedics;  Laterality: Right;   TOTAL HIP ARTHROPLASTY Right 04/24/2017   Procedure: RIGHT TOTAL HIP ARTHROPLASTY ANTERIOR APPROACH;  Surgeon: Fidel Rogue, MD;  Location: [facility];  Service: Orthopedics;  Laterality: Right;  Needs RNFA   TRANSCATHETER AORTIC VALVE REPLACEMENT, TRANSFEMORAL N/A 11/13/2021   Procedure: Transcatheter Aortic Valve Replacement, Transfemoral Edwards 23 MM SAPIEN 3 Ultra;  Surgeon: Wonda Sharper, MD;  Location: [facility] OR;  Service: Open Heart Surgery;  Laterality: N/A;  Transfemoral approach   Patient Active Problem List   Diagnosis Date Noted   Essential hypertension 12/02/2022   Lumbar adjacent segment disease with spondylolisthesis 08/13/2022   Degenerative spondylolisthesis 01/25/2022   S/P TAVR (transcatheter aortic valve replacement) 11/13/2021   Severe aortic stenosis    Urine frequency 07/26/2021   Depression 06/25/2021   S/P shoulder replacement, right 04/20/2021   Slow transit constipation 03/20/2021   Tick bite of abdomen 07/11/2020   Hyperlipidemia 06/22/2020   Type 2 diabetes, diet controlled (HCC) 06/22/2020   Chronic back pain 06/22/2020   Transaminitis 06/22/2020   PUD (peptic ulcer disease) 03/21/2018   Synovial cyst of lumbar facet joint 12/23/2017   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   Rotator cuff tear, right 11/21/2011    PCP: Berneta Fallow, MD  REFERRING PROVIDER: Jennetta Sayres, D,NP  REFERRING DIAG: unsteady gait, lumbar radiculopathy  Rationale for Evaluation and Treatment: Rehabilitation  THERAPY DIAG:  Difficulty in walking, not elsewhere classified  Acquired spondylolisthesis  Unsteadiness on feet  Abnormal posture  Muscle weakness (generalized)  ONSET DATE: 2 years,   SUBJECTIVE:                                                                                                                                                                                            SUBJECTIVE STATEMENT: I hurt all over. I hurt all the time. I go see the doctor on Thursday, they are going to do a shot.   PERTINENT HISTORY:  LS fusion 2 years  ago, hardware loosened, had some hardware removed, pt states constant l sided lower back pain since that time  PAIN:  Are you having pain? Yes: NPRS scale: 8 Pain location: L SI region and across central lower back Pain description: aching , when she gets up during the night, intense pain Aggravating factors: hurts constantly, worse in am after sleeping Relieving factors: meds  PRECAUTIONS: Fall  RED FLAGS: None   WEIGHT BEARING RESTRICTIONS: No  FALLS:  Has patient fallen in last 6 months? Yes. Number of falls 4-6  LIVING ENVIRONMENT: Lives with: lives with their spouse Lives in: House/apartment Stairs: 2 outdoor steps  Has following equipment at home: None  OCCUPATION: retired   PLOF: Independent with household mobility with device and Needs assistance with homemaking  PATIENT GOALS: get balance better and reduce pain  NEXT MD VISIT: 6 weeks  OBJECTIVE:  Note: Objective measures were completed at Evaluation unless otherwise noted.  DIAGNOSTIC FINDINGS:  na  PATIENT SURVEYS:  Modified Oswestry Low Back Pain Disability Questionnaire: 28 / 50 = 56.0 %  COGNITION: Overall cognitive status: Within functional limits for tasks assessed     SENSATION: Pt reports wnl  MUSCLE LENGTH: Hamstrings: Right 15 deg; Left 15 deg Thomas test: Right nt deg; Left nt deg  POSTURE: L pelvic shift in standing, loss of gluteal mass B  PALPATION: Some tenderness L piriformis, non tender L gluteals  LUMBAR ROM:   AROM eval  Flexion 50%  Extension 20%  Right lateral flexion   Left lateral flexion   Right rotation   Left rotation    (Blank rows = not tested)  LOWER EXTREMITY ROM:   all wfl B LE's LOWER EXTREMITY MMT:    MMT Right eval Left eval  Hip flexion   4/5  Hip extension    Hip abduction  4/5  Hip adduction    Hip internal rotation    Hip external rotation    Knee flexion    Knee extension    Ankle dorsiflexion    Ankle plantarflexion    Ankle inversion    Ankle eversion     (Blank rows = not tested)  LUMBAR SPECIAL TESTS:    FUNCTIONAL TESTS:  5 times sit to stand: 20.4 sec  GAIT: Distance walked: in clinic today, wide base of support, using walls for support, arms in high guard, noted L hip with forward lurch,hyperextends L hip to advance R LE .  No device in clinic today although uses walker at home Assistive device utilized: none    TREATMENT DATE: 10/06/23 NuStep L5 x17mins Seated march 2# 2x10 LAQ 2# 2x10 HS curls red 2x10 Hip abduction red 2x15 Ball squeeze 2x10 Supine LTR, HS stretch, knees to chest Bridges 2x10 STS 2x10     09/29/23: evaluation, inst in seated hip abd/ER with green t -band    to perform 2 x day 15 reps . Education in POC, goals  PATIENT EDUCATION:  Education details: POC, goals Person educated: Patient Education method: Explanation, Demonstration, Tactile cues, and Verbal cues Education comprehension: verbalized understanding, returned demonstration, verbal cues required, and tactile cues required  HOME EXERCISE PROGRAM: Access Code: [email or web address] URL: [email or web address] Date: 10/06/2023 Prepared by: Almetta Fam  Exercises - Supine Lower Trunk Rotation  - 1 x daily - 7 x weekly - 2 sets - 10 reps - Supine Bridge  - 1 x daily - 7 x weekly - 2 sets - 10 reps - Supine Single Knee to Chest Stretch  - 1 x daily - 7 x weekly - 2 reps - 15 hold - Seated Hip Abduction with Resistance  - 1 x daily - 7 x weekly - 2 sets - 15 reps - Sit to Stand  - 1 x daily - 7 x weekly - 2 sets - 10 reps  ASSESSMENT:  CLINICAL IMPRESSION: Patient is a 83 y.o. female who  was seen today by physical therapy for unsteady gait as well as L sided lumbar radiculopathy.  She reports ongoing back pain, that she states never goes away. She is walking with a cane today. She is fairly steady with the cane but would benefit from ongoing balance and strengthening to decrease her risk for falls. She should benefit from skilled PT to address her balance and fall risk, emphasizing l hip strength and hopefully some additional pain management techniques.   OBJECTIVE IMPAIRMENTS: decreased activity tolerance, decreased balance, decreased coordination, decreased endurance, decreased mobility, difficulty walking, decreased strength, decreased safety awareness, impaired perceived functional ability, postural dysfunction, and pain.   ACTIVITY LIMITATIONS: carrying, lifting, bending, standing, squatting, stairs, transfers, dressing, and locomotion level  PARTICIPATION LIMITATIONS: meal prep, cleaning, laundry, shopping, community activity, and yard work  PERSONAL FACTORS: Age, Behavior pattern, Past/current experiences, Time since onset of injury/illness/exacerbation, and 1-2 comorbidities: R THA, lumbar fusion with loosening and hardware removal are also affecting patient's functional outcome.   REHAB POTENTIAL: Good  CLINICAL DECISION MAKING: Evolving/moderate complexity  EVALUATION COMPLEXITY: Moderate   GOALS: Goals reviewed with patient? No  SHORT TERM GOALS: Target date: 2 weeks 10/13/23  I HEP Baseline: Goal status: INITIAL   LONG TERM GOALS: Target date: 12/22/23  Modified Oswestry Low Back Pain Disability Questionnaire: 28 / 50 = 56.0 % improve to 66% or greater Baseline:  Goal status: INITIAL  2.  BERG 26/56 improve to 35/56 or greater Baseline:  Goal status: INITIAL  3.  Improve L hip abd and flexion strength from 4/5 to 5/5  Baseline:  Goal status: INITIAL  4.  Improve 5 x sit to stand score to 15 sec or less from 20.4 Baseline:  Goal status:  INITIAL  PLAN:  PT FREQUENCY: 2x/week  PT DURATION: 12 weeks  PLANNED INTERVENTIONS: 97110-Therapeutic exercises, 97530- Therapeutic activity, [id number]- Neuromuscular re-education, 97535- Self Care, [id number]- Manual therapy, [phone]- Gait training, Patient/Family education, Balance training, and Stair training.  PLAN FOR NEXT SESSION: L hip strengthening emphasis, single leg or narrowed stance activities L LE   Almetta Fam, PT, DPT 10/06/2023, 2:47 PM

## 2023-10-06 ENCOUNTER — Ambulatory Visit

## 2023-10-06 DIAGNOSIS — R2681 Unsteadiness on feet: Secondary | ICD-10-CM | POA: Diagnosis not present

## 2023-10-06 DIAGNOSIS — R293 Abnormal posture: Secondary | ICD-10-CM

## 2023-10-06 DIAGNOSIS — M4310 Spondylolisthesis, site unspecified: Secondary | ICD-10-CM | POA: Diagnosis not present

## 2023-10-06 DIAGNOSIS — R262 Difficulty in walking, not elsewhere classified: Secondary | ICD-10-CM | POA: Diagnosis not present

## 2023-10-06 DIAGNOSIS — M6281 Muscle weakness (generalized): Secondary | ICD-10-CM | POA: Diagnosis not present

## 2023-10-09 DIAGNOSIS — M5416 Radiculopathy, lumbar region: Secondary | ICD-10-CM | POA: Diagnosis not present

## 2023-10-14 DIAGNOSIS — M8589 Other specified disorders of bone density and structure, multiple sites: Secondary | ICD-10-CM | POA: Diagnosis not present

## 2023-10-15 ENCOUNTER — Ambulatory Visit: Admitting: Physical Therapy

## 2023-10-16 ENCOUNTER — Ambulatory Visit: Admitting: Physical Therapy

## 2023-10-16 ENCOUNTER — Encounter: Payer: Self-pay | Admitting: Physical Therapy

## 2023-10-16 DIAGNOSIS — R2681 Unsteadiness on feet: Secondary | ICD-10-CM

## 2023-10-16 DIAGNOSIS — M6281 Muscle weakness (generalized): Secondary | ICD-10-CM

## 2023-10-16 DIAGNOSIS — R262 Difficulty in walking, not elsewhere classified: Secondary | ICD-10-CM

## 2023-10-16 DIAGNOSIS — R293 Abnormal posture: Secondary | ICD-10-CM

## 2023-10-16 DIAGNOSIS — M4310 Spondylolisthesis, site unspecified: Secondary | ICD-10-CM

## 2023-10-16 NOTE — Therapy (Signed)
 OUTPATIENT PHYSICAL THERAPY THORACOLUMBAR TREATMENT   Patient Name: Cheryl Cooper MRN: [id number] DOB:1940/07/27, 83 y.o., female Today's Date: 10/16/2023  END OF SESSION:  PT End of Session - 10/16/23 1601     Visit Number 3    Date for PT Re-Evaluation 12/22/23    PT Start Time 1601    PT Stop Time 1645    PT Time Calculation (min) 44 min    Activity Tolerance Patient tolerated treatment well    Behavior During Therapy [facility] for tasks assessed/performed           Past Medical History:  Diagnosis Date   Anxiety    Arthritis    Carpal tunnel syndrome    Cirrhosis of liver (HCC) 07/2020   Deafness in left ear    does not wear hearing aid   Depression    Diabetes mellitus    diet controlled - type 2, no meds   Diverticulitis    GERD (gastroesophageal reflux disease)    Hypercholesteremia    Hypertension    Hypothyroid    Memory loss    reports d/t concussion - mild   PONV (postoperative nausea and vomiting) yrs ago, none recent   Post concussion syndrome    S/P TAVR (transcatheter aortic valve replacement) 11/13/2021   s/p TAVR with a 23 mm Edwards S3UR via the TF approach by Dr. Wonda and Dr. Maryjane   Severe aortic stenosis    Stroke [facility])    Past Surgical History:  Procedure Laterality Date   ABDOMINAL HYSTERECTOMY  1986   ANTERIOR LATERAL LUMBAR FUSION WITH PERCUTANEOUS SCREW 1 LEVEL Left 01/25/2022   Procedure: ANTERIOR LATERAL LUMBAR FUSION WITH PERCUTANEOUS SCREW LUMBAR THREE-FOUR;  Surgeon: Louis Shove, MD;  Location: [facility] OR;  Service: Neurosurgery;  Laterality: Left;   BACK SURGERY  2010   lower   BIOPSY  01/12/2018   Procedure: BIOPSY;  Surgeon: Wilhelmenia Aloha Raddle., MD;  Location: [facility] ENDOSCOPY;  Service: Gastroenterology;;   CARDIAC CATHETERIZATION     CARPAL TUNNEL RELEASE Right 2023   CHOLECYSTECTOMY     ESOPHAGOGASTRODUODENOSCOPY (EGD) WITH PROPOFOL  N/A 01/12/2018   Procedure: ESOPHAGOGASTRODUODENOSCOPY (EGD) WITH PROPOFOL ;  Surgeon:  Wilhelmenia Aloha Raddle., MD;  Location: [facility] ENDOSCOPY;  Service: Gastroenterology;  Laterality: N/A;   EYE SURGERY Right    cataract   HARDWARE REMOVAL Left 05/09/2023   Procedure: Removal of left Lumbar five pedicle screw;  Surgeon: Louis Shove, MD;  Location: [facility] OR;  Service: Neurosurgery;  Laterality: Left;   INTRAOPERATIVE TRANSTHORACIC ECHOCARDIOGRAM N/A 11/13/2021   Procedure: INTRAOPERATIVE TRANSTHORACIC ECHOCARDIOGRAM;  Surgeon: Wonda Sharper, MD;  Location: [facility] OR;  Service: Open Heart Surgery;  Laterality: N/A;   left elobow surgery  2003   left rotator cuff  2001   LUMBAR LAMINECTOMY/DECOMPRESSION MICRODISCECTOMY Left 12/23/2017   Procedure: Laminectomy for facet/synovial cyst - left - Lumbar three-Lumbar four;  Surgeon: Louis Shove, MD;  Location: [facility] OR;  Service: Neurosurgery;  Laterality: Left;   LUMBAR SPINE SURGERY  08/12/2022   r foot surgery  1995   REVERSE SHOULDER ARTHROPLASTY Left 10/23/2018   Procedure: REVERSE TOTAL SHOULDER ARTHROPLASTY;  Surgeon: Kay Kemps, MD;  Location: [facility] ORS;  Service: Orthopedics;  Laterality: Left;  interscalene block   REVERSE SHOULDER ARTHROPLASTY Right 04/20/2021   Procedure: REVERSE SHOULDER ARTHROPLASTY;  Surgeon: Kay Kemps, MD;  Location: [facility] ORS;  Service: Orthopedics;  Laterality: Right;  with ISB   right hand surgery  2001   RIGHT HEART CATH AND CORONARY ANGIOGRAPHY N/A 10/24/2021  Procedure: RIGHT HEART CATH AND CORONARY ANGIOGRAPHY;  Surgeon: Wonda Sharper, MD;  Location: [facility] INVASIVE CV LAB;  Service: Cardiovascular;  Laterality: N/A;   right index finger surgery  2009   SHOULDER OPEN ROTATOR CUFF REPAIR  11/21/2011   Procedure: ROTATOR CUFF REPAIR SHOULDER OPEN;  Surgeon: Reyes JAYSON Billing, MD;  Location: [facility] ORS;  Service: Orthopedics;  Laterality: Right;  with subacromial decompression   SHOULDER OPEN ROTATOR CUFF REPAIR Right 05/09/2016   Procedure: Right shoulder mini open revision rotator cuff repair, subacromial  decompression;  Surgeon: Reyes Billing, MD;  Location: [facility] ORS;  Service: Orthopedics;  Laterality: Right;   TOTAL HIP ARTHROPLASTY Right 04/24/2017   Procedure: RIGHT TOTAL HIP ARTHROPLASTY ANTERIOR APPROACH;  Surgeon: Fidel Rogue, MD;  Location: [facility] ORS;  Service: Orthopedics;  Laterality: Right;  Needs RNFA   TRANSCATHETER AORTIC VALVE REPLACEMENT, TRANSFEMORAL N/A 11/13/2021   Procedure: Transcatheter Aortic Valve Replacement, Transfemoral Edwards 23 MM SAPIEN 3 Ultra;  Surgeon: Wonda Sharper, MD;  Location: [facility] OR;  Service: Open Heart Surgery;  Laterality: N/A;  Transfemoral approach   Patient Active Problem List   Diagnosis Date Noted   Essential hypertension 12/02/2022   Lumbar adjacent segment disease with spondylolisthesis 08/13/2022   Degenerative spondylolisthesis 01/25/2022   S/P TAVR (transcatheter aortic valve replacement) 11/13/2021   Severe aortic stenosis    Urine frequency 07/26/2021   Depression 06/25/2021   S/P shoulder replacement, right 04/20/2021   Slow transit constipation 03/20/2021   Tick bite of abdomen 07/11/2020   Hyperlipidemia 06/22/2020   Type 2 diabetes, diet controlled (HCC) 06/22/2020   Chronic back pain 06/22/2020   Transaminitis 06/22/2020   PUD (peptic ulcer disease) 03/21/2018   Synovial cyst of lumbar facet joint 12/23/2017   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   Rotator cuff tear, right 11/21/2011    PCP: Berneta Fallow, MD  REFERRING PROVIDER: Jennetta Sayres, D,NP  REFERRING DIAG: unsteady gait, lumbar radiculopathy  Rationale for Evaluation and Treatment: Rehabilitation  THERAPY DIAG:  Difficulty in walking, not elsewhere classified  Acquired spondylolisthesis  Unsteadiness on feet  Abnormal posture  Muscle weakness (generalized)  ONSET DATE: 2 years,   SUBJECTIVE:                                                                                                                                                                                            SUBJECTIVE STATEMENT: I am hurting all over  PERTINENT HISTORY:  LS fusion 2 years ago, hardware loosened, had some hardware removed, pt states constant l sided lower back pain since that time  PAIN:  Are you having pain? Yes: NPRS scale: 6-7/10  Pain location: L SI region and across central lower back Pain description: aching , when she gets up during the night, intense pain Aggravating factors: hurts constantly, worse in am after sleeping Relieving factors: meds  PRECAUTIONS: Fall  RED FLAGS: None   WEIGHT BEARING RESTRICTIONS: No  FALLS:  Has patient fallen in last 6 months? Yes. Number of falls 4-6  LIVING ENVIRONMENT: Lives with: lives with their spouse Lives in: House/apartment Stairs: 2 outdoor steps  Has following equipment at home: None  OCCUPATION: retired   PLOF: Independent with household mobility with device and Needs assistance with homemaking  PATIENT GOALS: get balance better and reduce pain  NEXT MD VISIT: 6 weeks  OBJECTIVE:  Note: Objective measures were completed at Evaluation unless otherwise noted.  DIAGNOSTIC FINDINGS:  na  PATIENT SURVEYS:  Modified Oswestry Low Back Pain Disability Questionnaire: 28 / 50 = 56.0 %  COGNITION: Overall cognitive status: Within functional limits for tasks assessed     SENSATION: Pt reports wnl  MUSCLE LENGTH: Hamstrings: Right 15 deg; Left 15 deg Thomas test: Right nt deg; Left nt deg  POSTURE: L pelvic shift in standing, loss of gluteal mass B  PALPATION: Some tenderness L piriformis, non tender L gluteals  LUMBAR ROM:   AROM eval  Flexion 50%  Extension 20%  Right lateral flexion   Left lateral flexion   Right rotation   Left rotation    (Blank rows = not tested)  LOWER EXTREMITY ROM:   all wfl B LE's LOWER EXTREMITY MMT:    MMT Right eval Left eval  Hip flexion  4/5  Hip extension    Hip abduction  4/5  Hip adduction    Hip internal rotation    Hip  external rotation    Knee flexion    Knee extension    Ankle dorsiflexion    Ankle plantarflexion    Ankle inversion    Ankle eversion     (Blank rows = not tested)  LUMBAR SPECIAL TESTS:    FUNCTIONAL TESTS:  5 times sit to stand: 20.4 sec  GAIT: Distance walked: in clinic today, wide base of support, using walls for support, arms in high guard, noted L hip with forward lurch,hyperextends L hip to advance R LE .  No device in clinic today although uses walker at home Assistive device utilized: none    TREATMENT DATE: 10/16/23 NuStep L5 x 6 min Hip add abll squeeze 2x10 Hs curls red 2x12 Hip abd red 2x15  Sit to stand 2x10  Seated Rows red 2x10 Standing shoulder Ext red 2x10  Standing march 2x10 4in step ups x10 with 2 rails 6in step up x10 with 2 rails  10/06/23 NuStep L5 x31mins Seated march 2# 2x10 LAQ 2# 2x10 HS curls red 2x10 Hip abduction red 2x15 Ball squeeze 2x10 Supine LTR, HS stretch, knees to chest Bridges 2x10 STS 2x10     09/29/23: evaluation, inst in seated hip abd/ER with green t -band    to perform 2 x day 15 reps . Education in POC, goals  PATIENT EDUCATION:  Education details: POC, goals Person educated: Patient Education method: Explanation, Demonstration, Tactile cues, and Verbal cues Education comprehension: verbalized understanding, returned demonstration, verbal cues required, and tactile cues required  HOME EXERCISE PROGRAM: Access Code: [email or web address] URL: [email or web address] Date: 10/06/2023 Prepared by: Almetta Fam  Exercises - Supine Lower Trunk Rotation  - 1 x daily - 7 x weekly - 2 sets - 10 reps - Supine Bridge  - 1 x daily - 7 x weekly - 2 sets - 10 reps - Supine Single Knee to Chest Stretch  - 1 x daily - 7 x weekly - 2 reps - 15 hold - Seated Hip Abduction with Resistance  - 1 x daily - 7 x  weekly - 2 sets - 15 reps - Sit to Stand  - 1 x daily - 7 x weekly - 2 sets - 10 reps  ASSESSMENT:  CLINICAL IMPRESSION: Patient is a 83 y.o. female who was seen today by physical therapy for unsteady gait as well as L sided lumbar radiculopathy.  She reports ongoing pain all over, that she states never goes away. She again is walking with a cane today.  Pt did have some instability with standing marches. Cues for full ROM needed with HS curls. Fatigue and weakness with step ups.  She should benefit from skilled PT to address her balance and fall risk, emphasizing l hip strength and hopefully some additional pain management techniques.   OBJECTIVE IMPAIRMENTS: decreased activity tolerance, decreased balance, decreased coordination, decreased endurance, decreased mobility, difficulty walking, decreased strength, decreased safety awareness, impaired perceived functional ability, postural dysfunction, and pain.   ACTIVITY LIMITATIONS: carrying, lifting, bending, standing, squatting, stairs, transfers, dressing, and locomotion level  PARTICIPATION LIMITATIONS: meal prep, cleaning, laundry, shopping, community activity, and yard work  PERSONAL FACTORS: Age, Behavior pattern, Past/current experiences, Time since onset of injury/illness/exacerbation, and 1-2 comorbidities: R THA, lumbar fusion with loosening and hardware removal are also affecting patient's functional outcome.   REHAB POTENTIAL: Good  CLINICAL DECISION MAKING: Evolving/moderate complexity  EVALUATION COMPLEXITY: Moderate   GOALS: Goals reviewed with patient? No  SHORT TERM GOALS: Target date: 2 weeks 10/13/23  I HEP Baseline: Goal status: progressing 10/16/23   LONG TERM GOALS: Target date: 12/22/23  Modified Oswestry Low Back Pain Disability Questionnaire: 28 / 50 = 56.0 % improve to 66% or greater Baseline:  Goal status: INITIAL  2.  BERG 26/56 improve to 35/56 or greater Baseline:  Goal status: INITIAL  3.   Improve L hip abd and flexion strength from 4/5 to 5/5  Baseline:  Goal status: INITIAL  4.  Improve 5 x sit to stand score to 15 sec or less from 20.4 Baseline:  Goal status: INITIAL  PLAN:  PT FREQUENCY: 2x/week  PT DURATION: 12 weeks  PLANNED INTERVENTIONS: [id number]-Therapeutic exercises, 97530- Therapeutic activity, [id number]- Neuromuscular re-education, 97535- Self Care, [id number]- Manual therapy, [phone]- Gait training, Patient/Family education, Balance training, and Stair training.  PLAN FOR NEXT SESSION: L hip strengthening emphasis, single leg or narrowed stance activities L LE   Tanda KANDICE Sorrow, PTA, DPT 10/16/2023, 4:02 PM

## 2023-10-21 ENCOUNTER — Ambulatory Visit: Attending: Student | Admitting: Physical Therapy

## 2023-10-21 ENCOUNTER — Encounter: Payer: Self-pay | Admitting: Physical Therapy

## 2023-10-21 DIAGNOSIS — R2681 Unsteadiness on feet: Secondary | ICD-10-CM | POA: Diagnosis not present

## 2023-10-21 DIAGNOSIS — R262 Difficulty in walking, not elsewhere classified: Secondary | ICD-10-CM | POA: Insufficient documentation

## 2023-10-21 DIAGNOSIS — M4310 Spondylolisthesis, site unspecified: Secondary | ICD-10-CM | POA: Diagnosis not present

## 2023-10-21 DIAGNOSIS — M6281 Muscle weakness (generalized): Secondary | ICD-10-CM | POA: Insufficient documentation

## 2023-10-21 DIAGNOSIS — R293 Abnormal posture: Secondary | ICD-10-CM | POA: Insufficient documentation

## 2023-10-21 NOTE — Therapy (Signed)
 OUTPATIENT PHYSICAL THERAPY THORACOLUMBAR TREATMENT   Patient Name: Cheryl Cooper MRN: [id number] DOB:February 27, 1940, 83 y.o., female Today's Date: 10/21/2023  END OF SESSION:  PT End of Session - 10/21/23 1603     Visit Number 4    Date for PT Re-Evaluation 12/22/23    PT Start Time 1602    PT Stop Time 1645    PT Time Calculation (min) 43 min    Activity Tolerance Patient tolerated treatment well    Behavior During Therapy [facility] for tasks assessed/performed           Past Medical History:  Diagnosis Date   Anxiety    Arthritis    Carpal tunnel syndrome    Cirrhosis of liver (HCC) 07/2020   Deafness in left ear    does not wear hearing aid   Depression    Diabetes mellitus    diet controlled - type 2, no meds   Diverticulitis    GERD (gastroesophageal reflux disease)    Hypercholesteremia    Hypertension    Hypothyroid    Memory loss    reports d/t concussion - mild   PONV (postoperative nausea and vomiting) yrs ago, none recent   Post concussion syndrome    S/P TAVR (transcatheter aortic valve replacement) 11/13/2021   s/p TAVR with a 23 mm Edwards S3UR via the TF approach by Dr. Wonda and Dr. Maryjane   Severe aortic stenosis    Stroke [facility])    Past Surgical History:  Procedure Laterality Date   ABDOMINAL HYSTERECTOMY  1986   ANTERIOR LATERAL LUMBAR FUSION WITH PERCUTANEOUS SCREW 1 LEVEL Left 01/25/2022   Procedure: ANTERIOR LATERAL LUMBAR FUSION WITH PERCUTANEOUS SCREW LUMBAR THREE-FOUR;  Surgeon: Louis Shove, MD;  Location: [facility] OR;  Service: Neurosurgery;  Laterality: Left;   BACK SURGERY  2010   lower   BIOPSY  01/12/2018   Procedure: BIOPSY;  Surgeon: Wilhelmenia Aloha Raddle., MD;  Location: [facility] ENDOSCOPY;  Service: Gastroenterology;;   CARDIAC CATHETERIZATION     CARPAL TUNNEL RELEASE Right 2023   CHOLECYSTECTOMY     ESOPHAGOGASTRODUODENOSCOPY (EGD) WITH PROPOFOL  N/A 01/12/2018   Procedure: ESOPHAGOGASTRODUODENOSCOPY (EGD) WITH PROPOFOL ;  Surgeon:  Wilhelmenia Aloha Raddle., MD;  Location: [facility] ENDOSCOPY;  Service: Gastroenterology;  Laterality: N/A;   EYE SURGERY Right    cataract   HARDWARE REMOVAL Left 05/09/2023   Procedure: Removal of left Lumbar five pedicle screw;  Surgeon: Louis Shove, MD;  Location: [facility] OR;  Service: Neurosurgery;  Laterality: Left;   INTRAOPERATIVE TRANSTHORACIC ECHOCARDIOGRAM N/A 11/13/2021   Procedure: INTRAOPERATIVE TRANSTHORACIC ECHOCARDIOGRAM;  Surgeon: Wonda Sharper, MD;  Location: [facility] OR;  Service: Open Heart Surgery;  Laterality: N/A;   left elobow surgery  2003   left rotator cuff  2001   LUMBAR LAMINECTOMY/DECOMPRESSION MICRODISCECTOMY Left 12/23/2017   Procedure: Laminectomy for facet/synovial cyst - left - Lumbar three-Lumbar four;  Surgeon: Louis Shove, MD;  Location: [facility] OR;  Service: Neurosurgery;  Laterality: Left;   LUMBAR SPINE SURGERY  08/12/2022   r foot surgery  1995   REVERSE SHOULDER ARTHROPLASTY Left 10/23/2018   Procedure: REVERSE TOTAL SHOULDER ARTHROPLASTY;  Surgeon: Kay Kemps, MD;  Location: [facility] ORS;  Service: Orthopedics;  Laterality: Left;  interscalene block   REVERSE SHOULDER ARTHROPLASTY Right 04/20/2021   Procedure: REVERSE SHOULDER ARTHROPLASTY;  Surgeon: Kay Kemps, MD;  Location: [facility] ORS;  Service: Orthopedics;  Laterality: Right;  with ISB   right hand surgery  2001   RIGHT HEART CATH AND CORONARY ANGIOGRAPHY N/A 10/24/2021  Procedure: RIGHT HEART CATH AND CORONARY ANGIOGRAPHY;  Surgeon: Wonda Sharper, MD;  Location: [facility] INVASIVE CV LAB;  Service: Cardiovascular;  Laterality: N/A;   right index finger surgery  2009   SHOULDER OPEN ROTATOR CUFF REPAIR  11/21/2011   Procedure: ROTATOR CUFF REPAIR SHOULDER OPEN;  Surgeon: Reyes JAYSON Billing, MD;  Location: [facility] ORS;  Service: Orthopedics;  Laterality: Right;  with subacromial decompression   SHOULDER OPEN ROTATOR CUFF REPAIR Right 05/09/2016   Procedure: Right shoulder mini open revision rotator cuff repair, subacromial  decompression;  Surgeon: Reyes Billing, MD;  Location: [facility] ORS;  Service: Orthopedics;  Laterality: Right;   TOTAL HIP ARTHROPLASTY Right 04/24/2017   Procedure: RIGHT TOTAL HIP ARTHROPLASTY ANTERIOR APPROACH;  Surgeon: Fidel Rogue, MD;  Location: [facility] ORS;  Service: Orthopedics;  Laterality: Right;  Needs RNFA   TRANSCATHETER AORTIC VALVE REPLACEMENT, TRANSFEMORAL N/A 11/13/2021   Procedure: Transcatheter Aortic Valve Replacement, Transfemoral Edwards 23 MM SAPIEN 3 Ultra;  Surgeon: Wonda Sharper, MD;  Location: [facility] OR;  Service: Open Heart Surgery;  Laterality: N/A;  Transfemoral approach   Patient Active Problem List   Diagnosis Date Noted   Essential hypertension 12/02/2022   Lumbar adjacent segment disease with spondylolisthesis 08/13/2022   Degenerative spondylolisthesis 01/25/2022   S/P TAVR (transcatheter aortic valve replacement) 11/13/2021   Severe aortic stenosis    Urine frequency 07/26/2021   Depression 06/25/2021   S/P shoulder replacement, right 04/20/2021   Slow transit constipation 03/20/2021   Tick bite of abdomen 07/11/2020   Hyperlipidemia 06/22/2020   Type 2 diabetes, diet controlled (HCC) 06/22/2020   Chronic back pain 06/22/2020   Transaminitis 06/22/2020   PUD (peptic ulcer disease) 03/21/2018   Synovial cyst of lumbar facet joint 12/23/2017   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   Rotator cuff tear, right 11/21/2011    PCP: Berneta Fallow, MD  REFERRING PROVIDER: Jennetta Sayres, D,NP  REFERRING DIAG: unsteady gait, lumbar radiculopathy  Rationale for Evaluation and Treatment: Rehabilitation  THERAPY DIAG:  Difficulty in walking, not elsewhere classified  Acquired spondylolisthesis  Unsteadiness on feet  Abnormal posture  Muscle weakness (generalized)  ONSET DATE: 2 years,   SUBJECTIVE:                                                                                                                                                                                            SUBJECTIVE STATEMENT: I dont know it just really hurts. All weekend long and yesterday, really hurting  PERTINENT HISTORY:  LS fusion 2 years ago, hardware loosened, had some hardware removed, pt states constant l sided lower back pain since that time  PAIN:  Are you having pain? Yes: NPRS scale: 10/10 Pain location: L SI region and across central lower back Pain description: aching , when she gets up during the night, intense pain Aggravating factors: hurts constantly, worse in am after sleeping Relieving factors: meds  PRECAUTIONS: Fall  RED FLAGS: None   WEIGHT BEARING RESTRICTIONS: No  FALLS:  Has patient fallen in last 6 months? Yes. Number of falls 4-6  LIVING ENVIRONMENT: Lives with: lives with their spouse Lives in: House/apartment Stairs: 2 outdoor steps  Has following equipment at home: None  OCCUPATION: retired   PLOF: Independent with household mobility with device and Needs assistance with homemaking  PATIENT GOALS: get balance better and reduce pain  NEXT MD VISIT: 6 weeks  OBJECTIVE:  Note: Objective measures were completed at Evaluation unless otherwise noted.  DIAGNOSTIC FINDINGS:  na  PATIENT SURVEYS:  Modified Oswestry Low Back Pain Disability Questionnaire: 28 / 50 = 56.0 %  COGNITION: Overall cognitive status: Within functional limits for tasks assessed     SENSATION: Pt reports wnl  MUSCLE LENGTH: Hamstrings: Right 15 deg; Left 15 deg Thomas test: Right nt deg; Left nt deg  POSTURE: L pelvic shift in standing, loss of gluteal mass B  PALPATION: Some tenderness L piriformis, non tender L gluteals  LUMBAR ROM:   AROM eval  Flexion 50%  Extension 20%  Right lateral flexion   Left lateral flexion   Right rotation   Left rotation    (Blank rows = not tested)  LOWER EXTREMITY ROM:   all wfl B LE's LOWER EXTREMITY MMT:    MMT Right eval Left eval  Hip flexion  4/5  Hip extension    Hip  abduction  4/5  Hip adduction    Hip internal rotation    Hip external rotation    Knee flexion    Knee extension    Ankle dorsiflexion    Ankle plantarflexion    Ankle inversion    Ankle eversion     (Blank rows = not tested)  LUMBAR SPECIAL TESTS:    FUNCTIONAL TESTS:  5 times sit to stand: 20.4 sec  GAIT: Distance walked: in clinic today, wide base of support, using walls for support, arms in high guard, noted L hip with forward lurch,hyperextends L hip to advance R LE .  No device in clinic today although uses walker at home Assistive device utilized: none    TREATMENT DATE: 10/21/23 NuStep L5 x 6 min Sit to stand holding red ball 2x10 Rows & Ext green 2x10 HS curls green 2x10 Bridges x10 LE on Pball bridges, oblq, K2c Passive stretching to bilateral HS, piriformis, K2C, lower trunk rotations   10/16/23 NuStep L5 x 6 min Hip add ball squeeze 2x10 Hs curls red 2x12 Hip abd red 2x15  Sit to stand 2x10  Seated Rows red 2x10 Standing shoulder Ext red 2x10  Standing march 2x10 4in step ups x10 with 2 rails 6in step up x10 with 2 rails  10/06/23 NuStep L5 x66mins Seated march 2# 2x10 LAQ 2# 2x10 HS curls red 2x10 Hip abduction red 2x15 Ball squeeze 2x10 Supine LTR, HS stretch, knees to chest Bridges 2x10 STS 2x10     09/29/23: evaluation, inst in seated hip abd/ER with green t -band    to perform 2 x day 15 reps . Education in POC, goals  PATIENT EDUCATION:  Education details: POC, goals Person educated: Patient Education method: Explanation, Demonstration, Tactile cues, and Verbal cues Education comprehension: verbalized understanding, returned demonstration, verbal cues required, and tactile cues required  HOME EXERCISE PROGRAM: Access Code: [email or web address] URL: [email or web address] Date: 10/06/2023 Prepared by: Almetta Fam  Exercises - Supine Lower Trunk Rotation  - 1 x daily - 7 x weekly - 2 sets - 10 reps - Supine Bridge  - 1 x daily - 7 x weekly - 2 sets - 10 reps - Supine Single Knee to Chest Stretch  - 1 x daily - 7 x weekly - 2 reps - 15 hold - Seated Hip Abduction with Resistance  - 1 x daily - 7 x weekly - 2 sets - 15 reps - Sit to Stand  - 1 x daily - 7 x weekly - 2 sets - 10 reps  ASSESSMENT:  CLINICAL IMPRESSION: Patient is a 83 y.o. female who was seen today by physical therapy for unsteady gait as well as L sided lumbar radiculopathy.  She reports ongoing pain all over that she states never goes away.  Cues for full ROM needed with HS curls. Fatigue with sit to stands.  Rigid posture noted during session.  She should benefit from skilled PT to address her balance and fall risk, emphasizing l hip strength and hopefully some additional pain management techniques.   OBJECTIVE IMPAIRMENTS: decreased activity tolerance, decreased balance, decreased coordination, decreased endurance, decreased mobility, difficulty walking, decreased strength, decreased safety awareness, impaired perceived functional ability, postural dysfunction, and pain.   ACTIVITY LIMITATIONS: carrying, lifting, bending, standing, squatting, stairs, transfers, dressing, and locomotion level  PARTICIPATION LIMITATIONS: meal prep, cleaning, laundry, shopping, community activity, and yard work  PERSONAL FACTORS: Age, Behavior pattern, Past/current experiences, Time since onset of injury/illness/exacerbation, and 1-2 comorbidities: R THA, lumbar fusion with loosening and hardware removal are also affecting patient's functional outcome.   REHAB POTENTIAL: Good  CLINICAL DECISION MAKING: Evolving/moderate complexity  EVALUATION COMPLEXITY: Moderate   GOALS: Goals reviewed with patient? No  SHORT TERM GOALS: Target date: 2 weeks 10/13/23  I HEP Baseline: Goal status: progressing 10/16/23   LONG TERM GOALS: Target date:  12/22/23  Modified Oswestry Low Back Pain Disability Questionnaire: 28 / 50 = 56.0 % improve to 66% or greater Baseline:  Goal status: INITIAL  2.  BERG 26/56 improve to 35/56 or greater Baseline:  Goal status: INITIAL  3.  Improve L hip abd and flexion strength from 4/5 to 5/5  Baseline:  Goal status: INITIAL  4.  Improve 5 x sit to stand score to 15 sec or less from 20.4 Baseline:  Goal status: INITIAL  PLAN:  PT FREQUENCY: 2x/week  PT DURATION: 12 weeks  PLANNED INTERVENTIONS: 97110-Therapeutic exercises, 97530- Therapeutic activity, [id number]- Neuromuscular re-education, 97535- Self Care, [id number]- Manual therapy, [phone]- Gait training, Patient/Family education, Balance training, and Stair training.  PLAN FOR NEXT SESSION: L hip strengthening emphasis, single leg or narrowed stance activities L LE   Tanda KANDICE Sorrow, PTA, DPT 10/21/2023, 4:03 PM

## 2023-10-22 NOTE — Telephone Encounter (Signed)
 Pt is stating she never got this script from [facility] in August. She is wanting a refill. She already has a urologist. Please advise pt at [phone]

## 2023-10-23 ENCOUNTER — Encounter: Payer: Self-pay | Admitting: Physical Therapy

## 2023-10-23 ENCOUNTER — Ambulatory Visit: Admitting: Physical Therapy

## 2023-10-23 DIAGNOSIS — M6281 Muscle weakness (generalized): Secondary | ICD-10-CM | POA: Diagnosis not present

## 2023-10-23 DIAGNOSIS — R262 Difficulty in walking, not elsewhere classified: Secondary | ICD-10-CM | POA: Diagnosis not present

## 2023-10-23 DIAGNOSIS — R293 Abnormal posture: Secondary | ICD-10-CM

## 2023-10-23 DIAGNOSIS — R2681 Unsteadiness on feet: Secondary | ICD-10-CM

## 2023-10-23 DIAGNOSIS — M4310 Spondylolisthesis, site unspecified: Secondary | ICD-10-CM | POA: Diagnosis not present

## 2023-10-23 NOTE — Therapy (Signed)
 OUTPATIENT PHYSICAL THERAPY THORACOLUMBAR TREATMENT   Patient Name: Cheryl Cooper MRN: [id number] DOB:1940/10/28, 83 y.o., female Today's Date: 10/23/2023  END OF SESSION:  PT End of Session - 10/23/23 1556     Visit Number 5    Date for PT Re-Evaluation 12/22/23    PT Start Time 1600    PT Stop Time 1645    PT Time Calculation (min) 45 min    Behavior During Therapy [facility] for tasks assessed/performed           Past Medical History:  Diagnosis Date   Anxiety    Arthritis    Carpal tunnel syndrome    Cirrhosis of liver (HCC) 07/2020   Deafness in left ear    does not wear hearing aid   Depression    Diabetes mellitus    diet controlled - type 2, no meds   Diverticulitis    GERD (gastroesophageal reflux disease)    Hypercholesteremia    Hypertension    Hypothyroid    Memory loss    reports d/t concussion - mild   PONV (postoperative nausea and vomiting) yrs ago, none recent   Post concussion syndrome    S/P TAVR (transcatheter aortic valve replacement) 11/13/2021   s/p TAVR with a 23 mm Edwards S3UR via the TF approach by Dr. Wonda and Dr. Maryjane   Severe aortic stenosis    Stroke [facility])    Past Surgical History:  Procedure Laterality Date   ABDOMINAL HYSTERECTOMY  1986   ANTERIOR LATERAL LUMBAR FUSION WITH PERCUTANEOUS SCREW 1 LEVEL Left 01/25/2022   Procedure: ANTERIOR LATERAL LUMBAR FUSION WITH PERCUTANEOUS SCREW LUMBAR THREE-FOUR;  Surgeon: Louis Shove, MD;  Location: [facility] OR;  Service: Neurosurgery;  Laterality: Left;   BACK SURGERY  2010   lower   BIOPSY  01/12/2018   Procedure: BIOPSY;  Surgeon: Wilhelmenia Aloha Raddle., MD;  Location: [facility] ENDOSCOPY;  Service: Gastroenterology;;   CARDIAC CATHETERIZATION     CARPAL TUNNEL RELEASE Right 2023   CHOLECYSTECTOMY     ESOPHAGOGASTRODUODENOSCOPY (EGD) WITH PROPOFOL  N/A 01/12/2018   Procedure: ESOPHAGOGASTRODUODENOSCOPY (EGD) WITH PROPOFOL ;  Surgeon: Wilhelmenia Aloha Raddle., MD;  Location: [facility] ENDOS[facility];  Service:  Gastroenterology;  Laterality: N/A;   EYE SURGERY Right    cataract   HARDWARE REMOVAL Left 05/09/2023   Procedure: Removal of left Lumbar five pedicle screw;  Surgeon: Louis Shove, MD;  Location: [facility] OR;  Service: Neurosurgery;  Laterality: Left;   INTRAOPERATIVE TRANSTHORACIC ECHOCARDIOGRAM N/A 11/13/2021   Procedure: INTRAOPERATIVE TRANSTHORACIC ECHOCARDIOGRAM;  Surgeon: Wonda Sharper, MD;  Location: [facility] OR;  Service: Open Heart Surgery;  Laterality: N/A;   left elobow surgery  2003   left rotator cuff  2001   LUMBAR LAMINECTOMY/DECOMPRESSION MICRODISCECTOMY Left 12/23/2017   Procedure: Laminectomy for facet/synovial cyst - left - Lumbar three-Lumbar four;  Surgeon: Louis Shove, MD;  Location: [facility] OR;  Service: Neurosurgery;  Laterality: Left;   LUMBAR SPINE SURGERY  08/12/2022   r foot surgery  1995   REVERSE SHOULDER ARTHROPLASTY Left 10/23/2018   Procedure: REVERSE TOTAL SHOULDER ARTHROPLASTY;  Surgeon: Kay Kemps, MD;  Location: [facility] ORS;  Service: Orthopedics;  Laterality: Left;  interscalene block   REVERSE SHOULDER ARTHROPLASTY Right 04/20/2021   Procedure: REVERSE SHOULDER ARTHROPLASTY;  Surgeon: Kay Kemps, MD;  Location: [facility] ORS;  Service: Orthopedics;  Laterality: Right;  with ISB   right hand surgery  2001   RIGHT HEART CATH AND CORONARY ANGIOGRAPHY N/A 10/24/2021   Procedure: RIGHT HEART CATH AND CORONARY ANGIOGRAPHY;  Surgeon: Wonda Sharper, MD;  Location: [facility] INVASIVE CV LAB;  Service: Cardiovascular;  Laterality: N/A;   right index finger surgery  2009   SHOULDER OPEN ROTATOR CUFF REPAIR  11/21/2011   Procedure: ROTATOR CUFF REPAIR SHOULDER OPEN;  Surgeon: Reyes JAYSON Billing, MD;  Location: [facility] ORS;  Service: Orthopedics;  Laterality: Right;  with subacromial decompression   SHOULDER OPEN ROTATOR CUFF REPAIR Right 05/09/2016   Procedure: Right shoulder mini open revision rotator cuff repair, subacromial decompression;  Surgeon: Reyes Billing, MD;  Location: [facility] ORS;  Service:  Orthopedics;  Laterality: Right;   TOTAL HIP ARTHROPLASTY Right 04/24/2017   Procedure: RIGHT TOTAL HIP ARTHROPLASTY ANTERIOR APPROACH;  Surgeon: Fidel Rogue, MD;  Location: [facility] ORS;  Service: Orthopedics;  Laterality: Right;  Needs RNFA   TRANSCATHETER AORTIC VALVE REPLACEMENT, TRANSFEMORAL N/A 11/13/2021   Procedure: Transcatheter Aortic Valve Replacement, Transfemoral Edwards 23 MM SAPIEN 3 Ultra;  Surgeon: Wonda Sharper, MD;  Location: [facility] OR;  Service: Open Heart Surgery;  Laterality: N/A;  Transfemoral approach   Patient Active Problem List   Diagnosis Date Noted   Essential hypertension 12/02/2022   Lumbar adjacent segment disease with spondylolisthesis 08/13/2022   Degenerative spondylolisthesis 01/25/2022   S/P TAVR (transcatheter aortic valve replacement) 11/13/2021   Severe aortic stenosis    Urine frequency 07/26/2021   Depression 06/25/2021   S/P shoulder replacement, right 04/20/2021   Slow transit constipation 03/20/2021   Tick bite of abdomen 07/11/2020   Hyperlipidemia 06/22/2020   Type 2 diabetes, diet controlled (HCC) 06/22/2020   Chronic back pain 06/22/2020   Transaminitis 06/22/2020   PUD (peptic ulcer disease) 03/21/2018   Synovial cyst of lumbar facet joint 12/23/2017   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   Rotator cuff tear, right 11/21/2011    PCP: Berneta Fallow, MD  REFERRING PROVIDER: Jennetta Sayres, D,NP  REFERRING DIAG: unsteady gait, lumbar radiculopathy  Rationale for Evaluation and Treatment: Rehabilitation  THERAPY DIAG:  Difficulty in walking, not elsewhere classified  Acquired spondylolisthesis  Unsteadiness on feet  Muscle weakness (generalized)  Abnormal posture  ONSET DATE: 2 years,   SUBJECTIVE:                                                                                                                                                                                           SUBJECTIVE STATEMENT: Hurting, I'm always  hurting  PERTINENT HISTORY:  LS fusion 2 years ago, hardware loosened, had some hardware removed, pt states constant l sided lower back pain since that time  PAIN:  Are you having pain? Yes: NPRS scale: 8/10 Pain location: L SI region and across central lower  back Pain description: aching , when she gets up during the night, intense pain Aggravating factors: hurts constantly, worse in am after sleeping Relieving factors: meds  PRECAUTIONS: Fall  RED FLAGS: None   WEIGHT BEARING RESTRICTIONS: No  FALLS:  Has patient fallen in last 6 months? Yes. Number of falls 4-6  LIVING ENVIRONMENT: Lives with: lives with their spouse Lives in: House/apartment Stairs: 2 outdoor steps  Has following equipment at home: None  OCCUPATION: retired   PLOF: Independent with household mobility with device and Needs assistance with homemaking  PATIENT GOALS: get balance better and reduce pain  NEXT MD VISIT: 6 weeks  OBJECTIVE:  Note: Objective measures were completed at Evaluation unless otherwise noted.  DIAGNOSTIC FINDINGS:  na  PATIENT SURVEYS:  Modified Oswestry Low Back Pain Disability Questionnaire: 28 / 50 = 56.0 %  COGNITION: Overall cognitive status: Within functional limits for tasks assessed     SENSATION: Pt reports wnl  MUSCLE LENGTH: Hamstrings: Right 15 deg; Left 15 deg Thomas test: Right nt deg; Left nt deg  POSTURE: L pelvic shift in standing, loss of gluteal mass B  PALPATION: Some tenderness L piriformis, non tender L gluteals  LUMBAR ROM:   AROM eval 10/23/23  Flexion 50% WFL  Extension 20% WFL  Right lateral flexion    Left lateral flexion    Right rotation    Left rotation     (Blank rows = not tested)  LOWER EXTREMITY ROM:   all wfl B LE's LOWER EXTREMITY MMT:    MMT Left eval Left 10/23/23  Hip flexion 4/5 4+  Hip extension    Hip abduction 4/5 5  Hip adduction    Hip internal rotation    Hip external rotation    Knee flexion    Knee  extension    Ankle dorsiflexion    Ankle plantarflexion    Ankle inversion    Ankle eversion     (Blank rows = not tested)  LUMBAR SPECIAL TESTS:    FUNCTIONAL TESTS:  5 times sit to stand: 20.4 sec  GAIT: Distance walked: in clinic today, wide base of support, using walls for support, arms in high guard, noted L hip with forward lurch,hyperextends L hip to advance R LE .  No device in clinic today although uses walker at home Assistive device utilized: none    TREATMENT DATE: 10/23/23 NuStep L 5 x 6 min Modified Oswestry Low Back Pain Disability Questionnaire: 25 / 50 = 50.0 % 5x sit to stand 14.61 sec Hs curls 20lb 2x15 Leg Ext 5lb 2x10 Sit to stand holding yellow ball 2x10 Rows & Ext green 2x10 Passive stretching to bilateral HS, piriformis, K2C, lower trunk rotations  10/21/23 NuStep L5 x 6 min Sit to stand holding red ball 2x10 Rows & Ext green 2x10 HS curls green 2x10 Bridges x10 LE on Pball bridges, oblq, K2c Passive stretching to bilateral HS, piriformis, K2C, lower trunk rotations   10/16/23 NuStep L5 x 6 min Hip add ball squeeze 2x10 Hs curls red 2x12 Hip abd red 2x15  Sit to stand 2x10  Seated Rows red 2x10 Standing shoulder Ext red 2x10  Standing march 2x10 4in step ups x10 with 2 rails 6in step up x10 with 2 rails  10/06/23 NuStep L5 x24mins Seated march 2# 2x10 LAQ 2# 2x10 HS curls red 2x10 Hip abduction red 2x15 Ball squeeze 2x10 Supine LTR, HS stretch, knees to chest Bridges 2x10 STS 2x10     09/29/23: evaluation, inst  in seated hip abd/ER with green t -band    to perform 2 x day 15 reps . Education in POC, goals                                                                                                                             PATIENT EDUCATION:  Education details: POC, goals Person educated: Patient Education method: Explanation, Demonstration, Tactile cues, and Verbal cues Education comprehension: verbalized understanding,  returned demonstration, verbal cues required, and tactile cues required  HOME EXERCISE PROGRAM: Access Code: [email or web address] URL: [email or web address] Date: 10/06/2023 Prepared by: Almetta Fam  Exercises - Supine Lower Trunk Rotation  - 1 x daily - 7 x weekly - 2 sets - 10 reps - Supine Bridge  - 1 x daily - 7 x weekly - 2 sets - 10 reps - Supine Single Knee to Chest Stretch  - 1 x daily - 7 x weekly - 2 reps - 15 hold - Seated Hip Abduction with Resistance  - 1 x daily - 7 x weekly - 2 sets - 15 reps - Sit to Stand  - 1 x daily - 7 x weekly - 2 sets - 10 reps  ASSESSMENT:  CLINICAL IMPRESSION: Patient is a 83 y.o. female who was seen today by physical therapy for unsteady gait as well as L sided lumbar radiculopathy.  She reports ongoing pain all over that she states never goes away.  She has progressed towards goals.  Rigid posture noted during session.  Postural cue needed with rows and ext. Bilateral HS tightness with stretching.  She should benefit from skilled PT to address her balance and fall risk, emphasizing l hip strength and hopefully some additional pain management techniques.   OBJECTIVE IMPAIRMENTS: decreased activity tolerance, decreased balance, decreased coordination, decreased endurance, decreased mobility, difficulty walking, decreased strength, decreased safety awareness, impaired perceived functional ability, postural dysfunction, and pain.   ACTIVITY LIMITATIONS: carrying, lifting, bending, standing, squatting, stairs, transfers, dressing, and locomotion level  PARTICIPATION LIMITATIONS: meal prep, cleaning, laundry, shopping, community activity, and yard work  PERSONAL FACTORS: Age, Behavior pattern, Past/current experiences, Time since onset of injury/illness/exacerbation, and 1-2 comorbidities: R THA, lumbar fusion with loosening and hardware removal are also affecting patient's functional outcome.   REHAB POTENTIAL: Good  CLINICAL DECISION MAKING:  Evolving/moderate complexity  EVALUATION COMPLEXITY: Moderate   GOALS: Goals reviewed with patient? No  SHORT TERM GOALS: Target date: 2 weeks 10/13/23  I HEP Baseline: Goal status: progressing 10/16/23   LONG TERM GOALS: Target date: 12/22/23  Modified Oswestry Low Back Pain Disability Questionnaire: 28 / 50 = 56.0 % improve to 66% or greater Baseline:  Goal status:  ongoing 10/23/2523 / 50 = 50.0 %  2.  BERG 26/56 improve to 35/56 or greater Baseline:  Goal status: INITIAL  3.  Improve L hip abd and flexion strength from 4/5 to 5/5 16.61 Baseline:  Goal status: Progressing 10/23/23  4.  Improve 5 x sit  to stand score to 15 sec or less from 20.4 Baseline:  Goal status: 10/23/23 Met 14.61 sec  PLAN:  PT FREQUENCY: 2x/week  PT DURATION: 12 weeks  PLANNED INTERVENTIONS: 97110-Therapeutic exercises, 97530- Therapeutic activity, 97112- Neuromuscular re-education, 97535- Self Care, [id number]- Manual therapy, [phone]- Gait training, Patient/Family education, Balance training, and Stair training.  PLAN FOR NEXT SESSION: L hip strengthening emphasis, single leg or narrowed stance activities L LE   Tanda KANDICE Sorrow, PTA, DPT 10/23/2023, 3:56 PM

## 2023-10-27 ENCOUNTER — Ambulatory Visit: Admitting: Physical Therapy

## 2023-10-29 ENCOUNTER — Telehealth: Payer: Self-pay

## 2023-10-29 ENCOUNTER — Ambulatory Visit: Admitting: Physical Therapy

## 2023-10-29 NOTE — Telephone Encounter (Signed)
 Called patient and informed her that the refill is at her pharmacy and has been since 09/26/23 with refills for the future. She thanked me for calling.

## 2023-10-29 NOTE — Telephone Encounter (Signed)
 Copied from CRM [id number]. Topic: Clinical - Medication Refill >> Sep 26, 2023  8:47 AM Pinkey ORN wrote: Medication: oxybutynin  (DITROPAN  XL) 15 MG 24 hr tablet  Has the patient contacted their pharmacy? Yes (Agent: If no, request that the patient contact the pharmacy for the refill. If patient does not wish to contact the pharmacy document the reason why and proceed with request.) (Agent: If yes, when and what did the pharmacy advise?)  This is the patient's preferred pharmacy:  [facility] [id number] [place], [place] - [address] [place] [address] Phone: [phone] Fax: [phone]  Is this the correct pharmacy for this prescription? Yes If no, delete pharmacy and type the correct one.   Has the prescription been filled recently? No  Is the patient out of the medication? Yes  Has the patient been seen for an appointment in the last year OR does the patient have an upcoming appointment? Yes  Can we respond through MyChart? Yes  Agent: Please be advised that Rx refills may take up to 3 business days. We ask that you follow-up with your pharmacy. >> Oct 29, 2023  9:43 AM Carlyon D wrote: Pt calling again in regards to medication and why it has not been sent over to pharmacy and it it has been over a month. Please reach out to pt in regards to this.  >> Sep 26, 2023  8:49 AM Pinkey ORN wrote: Patient stays she needs more than the 15MG 

## 2023-10-29 NOTE — Telephone Encounter (Signed)
  Called patient's pharmacy and spoke with Tiffany. I informed her of why I was calling. She stated that the refill is there and that patient has not called them requesting a refill. I thanked her for taking my call and stated that I will give patient a call.

## 2023-11-13 NOTE — Progress Notes (Signed)
 Cheryl Cooper                                          MRN: [id number]   11/13/2023   The [facility] Quality Team Specialist reviewed this patient medical record for the purposes of chart review for care gap closure. The following were reviewed: chart review for care gap closure-kidney health evaluation for diabetes:eGFR  and uACR.    [facility] Quality Team

## 2023-11-22 ENCOUNTER — Other Ambulatory Visit: Payer: Self-pay | Admitting: Family Medicine

## 2023-11-22 DIAGNOSIS — F32A Depression, unspecified: Secondary | ICD-10-CM

## 2023-11-27 ENCOUNTER — Other Ambulatory Visit: Payer: Self-pay | Admitting: Family Medicine

## 2023-11-27 DIAGNOSIS — M65331 Trigger finger, right middle finger: Secondary | ICD-10-CM | POA: Diagnosis not present

## 2023-11-27 DIAGNOSIS — M65332 Trigger finger, left middle finger: Secondary | ICD-10-CM | POA: Diagnosis not present

## 2023-11-27 DIAGNOSIS — M65341 Trigger finger, right ring finger: Secondary | ICD-10-CM | POA: Diagnosis not present

## 2023-11-27 DIAGNOSIS — G5601 Carpal tunnel syndrome, right upper limb: Secondary | ICD-10-CM | POA: Diagnosis not present

## 2023-11-27 DIAGNOSIS — Z4789 Encounter for other orthopedic aftercare: Secondary | ICD-10-CM | POA: Diagnosis not present

## 2023-12-09 ENCOUNTER — Other Ambulatory Visit: Payer: Self-pay | Admitting: Family Medicine

## 2023-12-10 DIAGNOSIS — M5416 Radiculopathy, lumbar region: Secondary | ICD-10-CM | POA: Diagnosis not present

## 2023-12-12 ENCOUNTER — Other Ambulatory Visit: Payer: Self-pay | Admitting: Neurosurgery

## 2023-12-12 ENCOUNTER — Encounter: Payer: Self-pay | Admitting: Neurosurgery

## 2023-12-12 DIAGNOSIS — M5416 Radiculopathy, lumbar region: Secondary | ICD-10-CM

## 2023-12-15 ENCOUNTER — Ambulatory Visit
Admission: RE | Admit: 2023-12-15 | Discharge: 2023-12-15 | Disposition: A | Source: Ambulatory Visit | Attending: Neurosurgery | Admitting: Neurosurgery

## 2023-12-15 DIAGNOSIS — M4726 Other spondylosis with radiculopathy, lumbar region: Secondary | ICD-10-CM | POA: Diagnosis not present

## 2023-12-15 DIAGNOSIS — M48061 Spinal stenosis, lumbar region without neurogenic claudication: Secondary | ICD-10-CM | POA: Diagnosis not present

## 2023-12-15 DIAGNOSIS — M5116 Intervertebral disc disorders with radiculopathy, lumbar region: Secondary | ICD-10-CM | POA: Diagnosis not present

## 2023-12-15 DIAGNOSIS — M5416 Radiculopathy, lumbar region: Secondary | ICD-10-CM

## 2023-12-16 ENCOUNTER — Other Ambulatory Visit: Payer: Self-pay | Admitting: Family Medicine

## 2023-12-16 DIAGNOSIS — E7800 Pure hypercholesterolemia, unspecified: Secondary | ICD-10-CM

## 2023-12-16 DIAGNOSIS — I10 Essential (primary) hypertension: Secondary | ICD-10-CM

## 2023-12-25 DIAGNOSIS — M47816 Spondylosis without myelopathy or radiculopathy, lumbar region: Secondary | ICD-10-CM | POA: Diagnosis not present

## 2024-01-06 DIAGNOSIS — K7460 Unspecified cirrhosis of liver: Secondary | ICD-10-CM | POA: Diagnosis not present

## 2024-01-06 DIAGNOSIS — K5900 Constipation, unspecified: Secondary | ICD-10-CM | POA: Diagnosis not present

## 2024-01-08 DIAGNOSIS — M65342 Trigger finger, left ring finger: Secondary | ICD-10-CM | POA: Diagnosis not present

## 2024-01-08 DIAGNOSIS — M65332 Trigger finger, left middle finger: Secondary | ICD-10-CM | POA: Diagnosis not present

## 2024-01-08 DIAGNOSIS — M65341 Trigger finger, right ring finger: Secondary | ICD-10-CM | POA: Diagnosis not present

## 2024-01-08 DIAGNOSIS — M65331 Trigger finger, right middle finger: Secondary | ICD-10-CM | POA: Diagnosis not present

## 2024-01-20 DIAGNOSIS — M47817 Spondylosis without myelopathy or radiculopathy, lumbosacral region: Secondary | ICD-10-CM | POA: Diagnosis not present

## 2024-01-20 DIAGNOSIS — M47816 Spondylosis without myelopathy or radiculopathy, lumbar region: Secondary | ICD-10-CM | POA: Diagnosis not present

## 2024-01-22 ENCOUNTER — Other Ambulatory Visit: Payer: Self-pay | Admitting: Gastroenterology

## 2024-01-22 ENCOUNTER — Ambulatory Visit
Admission: RE | Admit: 2024-01-22 | Discharge: 2024-01-22 | Disposition: A | Source: Ambulatory Visit | Attending: Gastroenterology | Admitting: Gastroenterology

## 2024-01-22 DIAGNOSIS — K5900 Constipation, unspecified: Secondary | ICD-10-CM

## 2024-02-06 ENCOUNTER — Encounter: Payer: Self-pay | Admitting: Emergency Medicine

## 2024-02-06 ENCOUNTER — Ambulatory Visit
Admission: EM | Admit: 2024-02-06 | Discharge: 2024-02-06 | Disposition: A | Discharge status: Home / Self Care | Attending: Student | Admitting: Student

## 2024-02-06 DIAGNOSIS — Z973 Presence of spectacles and contact lenses: Secondary | ICD-10-CM

## 2024-02-06 MED ORDER — MOXIFLOXACIN HCL 0.5 % OP SOLN: 1.0000 [drp] | Freq: Three times a day (TID) | OPHTHALMIC | 0 refills | Status: AC

## 2024-02-06 NOTE — ED Provider Notes (Signed)
 [facility]    CSN: [id number] Arrival date & time: 02/06/24  1423      History   Chief Complaint Chief Complaint  Patient presents with   Eye Problem    HPI Cheryl Cooper is a 83 y.o. female.   States she put a soft contact lens in her right eye on Monday 02/02/24 (5 days ago), and is unsure if it came out. Endorses foreign body sensation. Irritation and scratchiness. Denies photophobia.  Denies flashes of light or floaters in field of vision.  Denies visual changes.  She does not wear a contact lens in the left eye.  Per chart review, she was offered multiple appointments with her ophthalmologist, but declined all of them due to inconvenience.   HPI  Past Medical History:  Diagnosis Date   Anxiety    Arthritis    Carpal tunnel syndrome    Cirrhosis of liver (HCC) 07/2020   Deafness in left ear    does not wear hearing aid   Depression    Diabetes mellitus    diet controlled - type 2, no meds   Diverticulitis    GERD (gastroesophageal reflux disease)    Hypercholesteremia    Hypertension    Hypothyroid    Memory loss    reports d/t concussion - mild   PONV (postoperative nausea and vomiting) yrs ago, none recent   Post concussion syndrome    S/P TAVR (transcatheter aortic valve replacement) 11/13/2021   s/p TAVR with a 23 mm Edwards S3UR via the TF approach by Dr. Wonda and Dr. Maryjane   Severe aortic stenosis    Stroke [facility])     Patient Active Problem List   Diagnosis Date Noted   Essential hypertension 12/02/2022   Lumbar adjacent segment disease with spondylolisthesis 08/13/2022   Degenerative spondylolisthesis 01/25/2022   S/P TAVR (transcatheter aortic valve replacement) 11/13/2021   Severe aortic stenosis    Urine frequency 07/26/2021   Depression 06/25/2021   S/P shoulder replacement, right 04/20/2021   Slow transit constipation 03/20/2021   Tick bite of abdomen 07/11/2020   Hyperlipidemia 06/22/2020   Type 2 diabetes, diet  controlled (HCC) 06/22/2020   Chronic back pain 06/22/2020   Transaminitis 06/22/2020   PUD (peptic ulcer disease) 03/21/2018   Synovial cyst of lumbar facet joint 12/23/2017   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   Rotator cuff tear, right 11/21/2011    Past Surgical History:  Procedure Laterality Date   ABDOMINAL HYSTERECTOMY  1986   ANTERIOR LATERAL LUMBAR FUSION WITH PERCUTANEOUS SCREW 1 LEVEL Left 01/25/2022   Procedure: ANTERIOR LATERAL LUMBAR FUSION WITH PERCUTANEOUS SCREW LUMBAR THREE-FOUR;  Surgeon: Louis Shove, MD;  Location: [facility] OR;  Service: Neurosurgery;  Laterality: Left;   BACK SURGERY  2010   lower   BIOPSY  01/12/2018   Procedure: BIOPSY;  Surgeon: Wilhelmenia Aloha Raddle., MD;  Location: [facility];  Service: Gastroenterology;;   CARDIAC CATHETERIZATION     CARPAL TUNNEL RELEASE Right 2023   CHOLECYSTECTOMY     ESOPHAGOGASTRODUODENOSCOPY (EGD) WITH PROPOFOL  N/A 01/12/2018   Procedure: ESOPHAGOGASTRODUODENOSCOPY (EGD) WITH PROPOFOL ;  Surgeon: Wilhelmenia Aloha Raddle., MD;  Location: [facility];  Service: Gastroenterology;  Laterality: N/A;   EYE SURGERY Right    cataract   HARDWARE REMOVAL Left 05/09/2023   Procedure: Removal of left Lumbar five pedicle screw;  Surgeon: Louis Shove, MD;  Location: [facility] OR;  Service: Neurosurgery;  Laterality: Left;   INTRAOPERATIVE TRANSTHORACIC ECHOCARDIOGRAM N/A 11/13/2021   Procedure: INTRAOPERATIVE  TRANSTHORACIC ECHOCARDIOGRAM;  Surgeon: Wonda Sharper, MD;  Location: [facility] OR;  Service: Open Heart Surgery;  Laterality: N/A;   left elobow surgery  2003   left rotator cuff  2001   LUMBAR LAMINECTOMY/DECOMPRESSION MICRODISCECTOMY Left 12/23/2017   Procedure: Laminectomy for facet/synovial cyst - left - Lumbar three-Lumbar four;  Surgeon: Louis Shove, MD;  Location: [facility] OR;  Service: Neurosurgery;  Laterality: Left;   LUMBAR SPINE SURGERY  08/12/2022   r foot surgery  1995   REVERSE SHOULDER ARTHROPLASTY Left 10/23/2018   Procedure:  REVERSE TOTAL SHOULDER ARTHROPLASTY;  Surgeon: Kay Kemps, MD;  Location: [facility] ORS;  Service: Orthopedics;  Laterality: Left;  interscalene block   REVERSE SHOULDER ARTHROPLASTY Right 04/20/2021   Procedure: REVERSE SHOULDER ARTHROPLASTY;  Surgeon: Kay Kemps, MD;  Location: [facility] ORS;  Service: Orthopedics;  Laterality: Right;  with ISB   right hand surgery  2001   RIGHT HEART CATH AND CORONARY ANGIOGRAPHY N/A 10/24/2021   Procedure: RIGHT HEART CATH AND CORONARY ANGIOGRAPHY;  Surgeon: Wonda Sharper, MD;  Location: [facility] INVASIVE CV LAB;  Service: Cardiovascular;  Laterality: N/A;   right index finger surgery  2009   SHOULDER OPEN ROTATOR CUFF REPAIR  11/21/2011   Procedure: ROTATOR CUFF REPAIR SHOULDER OPEN;  Surgeon: Reyes JAYSON Billing, MD;  Location: [facility] ORS;  Service: Orthopedics;  Laterality: Right;  with subacromial decompression   SHOULDER OPEN ROTATOR CUFF REPAIR Right 05/09/2016   Procedure: Right shoulder mini open revision rotator cuff repair, subacromial decompression;  Surgeon: Reyes Billing, MD;  Location: [facility];  Service: Orthopedics;  Laterality: Right;   TOTAL HIP ARTHROPLASTY Right 04/24/2017   Procedure: RIGHT TOTAL HIP ARTHROPLASTY ANTERIOR APPROACH;  Surgeon: Fidel Rogue, MD;  Location: [facility];  Service: Orthopedics;  Laterality: Right;  Needs RNFA   TRANSCATHETER AORTIC VALVE REPLACEMENT, TRANSFEMORAL N/A 11/13/2021   Procedure: Transcatheter Aortic Valve Replacement, Transfemoral Edwards 23 MM SAPIEN 3 Ultra;  Surgeon: Wonda Sharper, MD;  Location: [facility] OR;  Service: Open Heart Surgery;  Laterality: N/A;  Transfemoral approach    OB History     Gravida  5   Para  3   Term      Preterm      AB  2   Living  3      SAB  2   IAB      Ectopic      Multiple      Live Births  3            Home Medications    Prior to Admission medications  Medication Sig Start Date End Date Taking? Authorizing Provider  moxifloxacin  (VIGAMOX ) 0.5 % ophthalmic  solution Place 1 drop into the right eye 3 (three) times daily for 7 days. 02/06/24 02/13/24 Yes Carrine Kroboth E, PA-C  alendronate  (FOSAMAX ) 70 MG tablet 1 tablet 30 minutes before the first food, beverage or medicine of the day with plain water  Orally; Duration: 30 days    [provider]  amLODipine  (NORVASC ) 5 MG tablet TAKE 1 AND 1/2 TABLETS BY MOUTH DAILY 12/16/23   Berneta Elsie Sayre, MD  amLODipine -benazepril (LOTREL) 5-10 MG capsule 1.5 tablets Orally once a day    [provider]  aspirin  81 MG chewable tablet Chew 1 tablet (81 mg total) by mouth daily. 11/14/21   Sebastian Lamarr SAUNDERS, PA-C  Calcium  Carbonate-Vit D-Min (CALCIUM  600+D PLUS MINERALS PO) Take 2 tablets by mouth daily. Chewable    [provider]  escitalopram  (LEXAPRO ) 20 MG tablet  TAKE 1 TABLET BY MOUTH DAILY 06/03/23   Berneta Elsie Sayre, MD  esomeprazole  (NEXIUM ) 40 MG capsule TAKE 1 CAPSULE BY MOUTH 2 TIMES A DAY BEFORE A MEAL 11/27/23   Berneta Elsie Sayre, MD  estradiol  (ESTRACE ) 0.1 MG/GM vaginal cream Place 1 g vaginally 2 (two) times a week. 10/02/23   Prentiss Annabella LABOR, NP  ezetimibe  (ZETIA ) 10 MG tablet TAKE 1 TABLET BY MOUTH DAILY 09/22/23   Berneta Elsie Sayre, MD  gabapentin  (NEURONTIN ) 100 MG capsule Take 100 mg by mouth 3 (three) times daily. 09/10/23   [provider]  GINKGO BILOBA PO Take 1 tablet by mouth in the morning.    [provider]  glucose blood test strip daily. 11/09/20   [provider]  HYDROcodone -acetaminophen  (NORCO/VICODIN) 5-325 MG tablet Take 1 tablet by mouth every 6 (six) hours as needed.    [provider]  levothyroxine  (SYNTHROID ) 75 MCG tablet TAKE 1 TABLET BY MOUTH DAILY WITH BREAKFAST 05/19/23   Berneta Elsie Sayre, MD  LINZESS 72 MCG capsule Take 72 mcg by mouth every morning.    [provider]  Multiple Vitamins-Minerals (WOMENS MULTI GUMMIES PO) Take 2 tablets by mouth daily.    [provider]  oxybutynin  (DITROPAN  XL) 15 MG 24 hr tablet TAKE 1 TABLET BY MOUTH AT BEDTIME 09/26/23   Berneta Elsie Sayre, MD  oxyCODONE -acetaminophen  (PERCOCET/ROXICET) 5-325 MG tablet Take 1 tablet by mouth 4 (four) times daily as needed. 03/26/23   [provider]  Probiotic Product (PROBIOTIC PO) Take 2 capsules by mouth daily.    [provider]  rosuvastatin  (CRESTOR ) 10 MG tablet TAKE 1 TABLET BY MOUTH DAILY 12/16/23   Berneta Elsie Sayre, MD  tiZANidine  (ZANAFLEX ) 4 MG tablet Take 1 tablet (4 mg total) by mouth every 6 (six) hours as needed for muscle spasms. 05/10/23   Gillie Duncans, MD  traZODone  (DESYREL ) 50 MG tablet TAKE 1/2 TABLET BY MOUTH DAILY AT BEDTIME 11/24/23   Berneta Elsie Sayre, MD    Family History Family History  Problem Relation Age of Onset   Heart attack Mother    Stroke Father    Heart attack Father    Stroke Sister    Stroke Brother    Diabetes Brother    Dementia Brother    Heart disease Brother    Stroke Sister        TIA    Social History Social History[1]   Allergies   Other, Flexeril  [cyclobenzaprine ], Codeine, Librax [chlordiazepoxide-clidinium], and Lipitor [atorvastatin]   Review of Systems Review of Systems  Eyes:  Positive for itching. Negative for photophobia, pain, discharge, redness and visual disturbance.     Physical Exam Triage Vital Signs ED Triage Vitals  Encounter Vitals Group     BP 02/06/24 1456 121/72     Girls Systolic BP Percentile --      Girls Diastolic BP Percentile --      Boys Systolic BP Percentile --      Boys Diastolic BP Percentile --      Pulse Rate 02/06/24 1456 (!) 59     Resp 02/06/24 1456 17     Temp 02/06/24 1456 98.5 F (36.9 C)     Temp Source 02/06/24 1456 Oral     SpO2 02/06/24 1456 99 %     Weight --      Height --      Head Circumference --      Peak Flow --  Pain Score 02/06/24 1458 3     Pain Loc --      Pain Education --      Exclude from Growth Chart  --    No data found.  Updated Vital Signs BP 121/72 (BP Location: Right Arm)   Pulse (!) 59   Temp 98.5 F (36.9 C) (Oral)   Resp 17   SpO2 99%   Visual Acuity Right Eye Distance:   Left Eye Distance:   Bilateral Distance:    Right Eye Near:   Left Eye Near:    Bilateral Near:     Physical Exam Vitals reviewed.  Constitutional:      General: She is not in acute distress.    Appearance: Normal appearance. She is not ill-appearing.  HENT:     Head: Normocephalic and atraumatic.  Eyes:     General: Lids are normal.        Right eye: No discharge or hordeolum.        Left eye: No foreign body, discharge or hordeolum.     Conjunctiva/sclera:     Right eye: Right conjunctiva is not injected. No chemosis, exudate or hemorrhage.    Left eye: Left conjunctiva is not injected. No chemosis, exudate or hemorrhage.    Comments: Right eye: No conjunctival injection, exudate, or lid changes.  Fluorescein exam was negative for corneal abrasion or laceration.  Around the iris, I saw very faint circumferential fluorescein uptake, consistent with contact lens.  Pulmonary:     Effort: Pulmonary effort is normal.  Neurological:     General: No focal deficit present.     Mental Status: She is alert and oriented to person, place, and time.  Psychiatric:        Mood and Affect: Mood normal.        Behavior: Behavior normal.        Thought Content: Thought content normal.        Judgment: Judgment normal.      UC Treatments / Results  Labs (all labs ordered are listed, but only abnormal results are displayed) Labs Reviewed - No data to display  EKG   Radiology No results found.  Procedures Procedures (including critical care time)  Medications Ordered in UC Medications - No data to display  Initial Impression / Assessment and Plan / UC Course  I have reviewed the triage vital signs and the nursing notes.  Pertinent labs & imaging results that were available during my care  of the patient were reviewed by me and considered in my medical decision making (see chart for details).     Patient is a pleasant 83 y.o. female presenting with possible contact lens stuck in her right eye. The patient is afebrile and nontachycardic.  Antipyretic has not been administered today.  On fluorescein exam, I did not see a corneal abrasion or laceration, but there was a faint circumferential fluorescein uptake around the iris, consistent with a contact lens.  I am unable to fully and properly evaluate her in the urgent care setting for this issue.  I am unable to get the contact lens out of her eye with tools available to me in urgent care.  It appears that her ophthalmologist offered her multiple appointments, and she declined these due to inconvenience.  We also attempted to call her earlier today, to inform her that she needs to be seen by ophthalmology, but she never saw this message.  I informed the patient, that she MUST be  seen by an eye doctor as soon as possible to have this fully evaluated.  She verbalizes that she will call her ophthalmologist as soon as she is done here, to make this appointment.  She understands that if she does not see an ophthalmologist, this could result in vision loss.  She understands that if her ophthalmologist cannot see her, and her symptoms worsen, then she will need to head to the emergency department.  Final Clinical Impressions(s) / UC Diagnoses   Final diagnoses:  Wears contact lenses     Discharge Instructions      -If there is a contact still in your eye, it is really stuck on your eyeball, and I did not see it. But it is possible a contact is still there!  -We are treating you with antibiotic drops while you wait on your appointment with your eye doctor - Vigamox  3x daily x7 days -You can still do over-the-counter lubricating drops, but make sure to wait 30 minutes after using the antibiotic drops -Call your eye doctor TODAY. Tell them  you need an appointment as soon as possible! You need to see them, to make sure there is not still a contact in your eye, which can cause permanent vision loss -If your symptoms change or worsen over the weekend, you will need to go to the ER to see an eye doctor      ED Prescriptions     Medication Sig Dispense Auth. Provider   moxifloxacin  (VIGAMOX ) 0.5 % ophthalmic solution Place 1 drop into the right eye 3 (three) times daily for 7 days. 3 mL Latasha Buczkowski E, PA-C      PDMP not reviewed this encounter.     [1]  Social History Tobacco Use   Smoking status: Never   Smokeless tobacco: Never  Vaping Use   Vaping status: Never Used  Substance Use Topics   Alcohol  use: Not Currently   Drug use: No     Arlyss Leita BRAVO, PA-C 02/06/24 1531  "

## 2024-02-06 NOTE — Discharge Instructions (Addendum)
-  If there is a contact still in your eye, it is really stuck on your eyeball, and I did not see it. But it is possible a contact is still there!  -We are treating you with antibiotic drops while you wait on your appointment with your eye doctor - Vigamox 3x daily x7 days -You can still do over-the-counter lubricating drops, but make sure to wait 30 minutes after using the antibiotic drops -Call your eye doctor TODAY. Tell them you need an appointment as soon as possible! You need to see them, to make sure there is not still a contact in your eye, which can cause permanent vision loss -If your symptoms change or worsen over the weekend, you will need to go to the ER to see an eye doctor

## 2024-02-06 NOTE — ED Triage Notes (Signed)
 Pt states she is unsure if she has contact stuck in right eye. C/o right eye feeling scratchy for 3 days.

## 2024-02-10 ENCOUNTER — Other Ambulatory Visit: Payer: Self-pay | Admitting: Family Medicine

## 2024-02-10 DIAGNOSIS — E039 Hypothyroidism, unspecified: Secondary | ICD-10-CM

## 2024-02-19 ENCOUNTER — Other Ambulatory Visit: Payer: Self-pay | Admitting: Family Medicine

## 2024-02-19 DIAGNOSIS — F32A Depression, unspecified: Secondary | ICD-10-CM

## 2024-02-20 ENCOUNTER — Ambulatory Visit
Admission: RE | Admit: 2024-02-20 | Discharge: 2024-02-20 | Disposition: A | Discharge status: Home / Self Care | Source: Ambulatory Visit | Attending: Family Medicine

## 2024-02-20 ENCOUNTER — Ambulatory Visit: Payer: Self-pay

## 2024-02-20 VITALS — BP 153/71 | HR 62 | Temp 97.4°F | Resp 15

## 2024-02-20 DIAGNOSIS — M79661 Pain in right lower leg: Secondary | ICD-10-CM

## 2024-02-20 DIAGNOSIS — M7989 Other specified soft tissue disorders: Secondary | ICD-10-CM

## 2024-02-20 NOTE — Telephone Encounter (Signed)
 Poor connection on transfer to NT. Attempted to reach patient for triage, but phone rang without answer. Placed in callbacks for follow up  Copied from CRM #[id number]. Topic: Clinical - Red Word Triage >> Feb 20, 2024  2:40 PM Deleta RAMAN wrote: Red Word that prompted transfer to Nurse Triage: patient has swelling little to no pain

## 2024-02-20 NOTE — ED Triage Notes (Signed)
 Pt present with c/o rt leg swelling x 5 weeks. Pt states her leg only swells up in the evening.

## 2024-02-20 NOTE — Telephone Encounter (Signed)
 Call was transferred to this RN. This RN could not understand patient, due to poor connection. This RN called patient's alternate phone number [phone], per patient's request. No answer, LVM. Routing for additional attempts.

## 2024-02-20 NOTE — Discharge Instructions (Addendum)
 You have been scheduled for an outpatient ultrasound tomorrow at 11 AM.  Keep your right leg elevated and uses compression stockings in the meantime.  For any pain you can take Tylenol  500 mg every 6-8 hours as needed.  Do not hesitate to seek immediate care at the nearest emergency department for any new concerning symptoms.

## 2024-02-20 NOTE — Telephone Encounter (Signed)
 FYI Only or Action Required?: FYI only for provider: urgent care appt .  Patient was last seen in primary care on 06/10/2023 by Berneta Elsie Sayre, MD.  Called Nurse Triage reporting Leg Swelling.  Symptoms began couple weeks ago.  Interventions attempted: Other: compression stocking.  Symptoms are: worse throughout the day.  Triage Disposition: See HCP Within 4 Hours (Or PCP Triage)  Patient/caregiver understands and will follow disposition?: Yes                    Copied from CRM #[id number]. Topic: Clinical - Red Word Triage >> Feb 20, 2024  2:40 PM Deleta RAMAN wrote: Red Word that prompted transfer to Nurse Triage: patient has swelling little to no pain Reason for Disposition  [1] Thigh, calf, or ankle swelling AND [2] only 1 side  Answer Assessment - Initial Assessment Questions 1. ONSET: When did the swelling start? (e.g., minutes, hours, days)     Couple weeks ago. Swelling is not present in the morning when waking up, only present in the evening time.  2. LOCATION: What part of the leg is swollen?  Are both legs swollen or just one leg?     Right leg. From foot to knee.  3. SEVERITY: How bad is the swelling? (e.g., localized; mild, moderate, severe)     Moderate.  4. REDNESS: Is there redness or signs of infection?     When it starts to swelling whole thing turns red. Not a bright red, like a sunburn red.  5. PAIN: Is the swelling painful to touch? If Yes, ask: How painful is it?   (Scale 1-10; mild, moderate or severe)     Yes, 6/10. Aches.  6. FEVER: Do you have a fever? If Yes, ask: What is it, how was it measured, and when did it start?      No.  7. CAUSE: What do you think is causing the leg swelling?     Unknown, she states could be from drinking fluids.  8. MEDICAL HISTORY: Do you have a history of blood clots (e.g., DVT), cancer, heart failure, kidney disease, or liver failure?     Liver cirrhosis.  9. RECURRENT  SYMPTOM: Have you had leg swelling before? If Yes, ask: When was the last time? What happened that time?     No.  10. OTHER SYMPTOMS: Do you have any other symptoms? (e.g., chest pain, difficulty breathing)       She states she always has abdominal swelling that is being treated with Benefiber and Miralax . No chest pain, SOB, fever.  Protocols used: Leg Swelling and Edema-A-AH

## 2024-02-20 NOTE — ED Provider Notes (Signed)
 " [facility]    CSN: [id number] Arrival date & time: 02/20/24  1600      History   Chief Complaint Chief Complaint  Patient presents with   Leg Swelling    Entered by patient    HPI Cheryl Cooper is a 84 y.o. female.   Patient presents to clinic over concern of right lower leg pain and swelling that has been ongoing and intermittent for the past few weeks, she is unsure how long but maybe around 5 weeks.  Seems to notice that the right lower leg swells up at the end of the day.  She did buy a compression stocking today but has not yet used it.  Intermittent calf pain that is tender to palpation.  Swelling seems to have improved today.  Patient has not had any trauma or injuries.  She does have an artificial nails which appear to be impacting the pulse oximetry reading.  She has not had cough, wheezing, shortness of breath or recent illness.  The history is provided by the patient and medical records.    Past Medical History:  Diagnosis Date   Anxiety    Arthritis    Carpal tunnel syndrome    Cirrhosis of liver (HCC) 07/2020   Deafness in left ear    does not wear hearing aid   Depression    Diabetes mellitus    diet controlled - type 2, no meds   Diverticulitis    GERD (gastroesophageal reflux disease)    Hypercholesteremia    Hypertension    Hypothyroid    Memory loss    reports d/t concussion - mild   PONV (postoperative nausea and vomiting) yrs ago, none recent   Post concussion syndrome    S/P TAVR (transcatheter aortic valve replacement) 11/13/2021   s/p TAVR with a 23 mm Edwards S3UR via the TF approach by Dr. Wonda and Dr. Maryjane   Severe aortic stenosis    Stroke [facility])     Patient Active Problem List   Diagnosis Date Noted   Essential hypertension 12/02/2022   Lumbar adjacent segment disease with spondylolisthesis 08/13/2022   Degenerative spondylolisthesis 01/25/2022   S/P TAVR (transcatheter aortic valve replacement) 11/13/2021    Severe aortic stenosis    Urine frequency 07/26/2021   Depression 06/25/2021   S/P shoulder replacement, right 04/20/2021   Slow transit constipation 03/20/2021   Tick bite of abdomen 07/11/2020   Hyperlipidemia 06/22/2020   Type 2 diabetes, diet controlled (HCC) 06/22/2020   Chronic back pain 06/22/2020   Transaminitis 06/22/2020   PUD (peptic ulcer disease) 03/21/2018   Synovial cyst of lumbar facet joint 12/23/2017   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   Rotator cuff tear, right 11/21/2011    Past Surgical History:  Procedure Laterality Date   ABDOMINAL HYSTERECTOMY  1986   ANTERIOR LATERAL LUMBAR FUSION WITH PERCUTANEOUS SCREW 1 LEVEL Left 01/25/2022   Procedure: ANTERIOR LATERAL LUMBAR FUSION WITH PERCUTANEOUS SCREW LUMBAR THREE-FOUR;  Surgeon: Louis Shove, MD;  Location: [facility] OR;  Service: Neurosurgery;  Laterality: Left;   BACK SURGERY  2010   lower   BIOPSY  01/12/2018   Procedure: BIOPSY;  Surgeon: Wilhelmenia Aloha Raddle., MD;  Location: [facility] ENDOSCOPY;  Service: Gastroenterology;;   CARDIAC CATHETERIZATION     CARPAL TUNNEL RELEASE Right 2023   CHOLECYSTECTOMY     ESOPHAGOGASTRODUODENOSCOPY (EGD) WITH PROPOFOL  N/A 01/12/2018   Procedure: ESOPHAGOGASTRODUODENOSCOPY (EGD) WITH PROPOFOL ;  Surgeon: Wilhelmenia Aloha Raddle., MD;  Location: [facility] ENDOSCOPY;  Service:  Gastroenterology;  Laterality: N/A;   EYE SURGERY Right    cataract   HARDWARE REMOVAL Left 05/09/2023   Procedure: Removal of left Lumbar five pedicle screw;  Surgeon: Louis Shove, MD;  Location: [facility] OR;  Service: Neurosurgery;  Laterality: Left;   INTRAOPERATIVE TRANSTHORACIC ECHOCARDIOGRAM N/A 11/13/2021   Procedure: INTRAOPERATIVE TRANSTHORACIC ECHOCARDIOGRAM;  Surgeon: Wonda Sharper, MD;  Location: [facility] OR;  Service: Open Heart Surgery;  Laterality: N/A;   left elobow surgery  2003   left rotator cuff  2001   LUMBAR LAMINECTOMY/DECOMPRESSION MICRODISCECTOMY Left 12/23/2017   Procedure: Laminectomy for facet/synovial  cyst - left - Lumbar three-Lumbar four;  Surgeon: Louis Shove, MD;  Location: [facility] OR;  Service: Neurosurgery;  Laterality: Left;   LUMBAR SPINE SURGERY  08/12/2022   r foot surgery  1995   REVERSE SHOULDER ARTHROPLASTY Left 10/23/2018   Procedure: REVERSE TOTAL SHOULDER ARTHROPLASTY;  Surgeon: Kay Kemps, MD;  Location: [facility] ORS;  Service: Orthopedics;  Laterality: Left;  interscalene block   REVERSE SHOULDER ARTHROPLASTY Right 04/20/2021   Procedure: REVERSE SHOULDER ARTHROPLASTY;  Surgeon: Kay Kemps, MD;  Location: [facility] ORS;  Service: Orthopedics;  Laterality: Right;  with ISB   right hand surgery  2001   RIGHT HEART CATH AND CORONARY ANGIOGRAPHY N/A 10/24/2021   Procedure: RIGHT HEART CATH AND CORONARY ANGIOGRAPHY;  Surgeon: Wonda Sharper, MD;  Location: [facility] INVASIVE CV LAB;  Service: Cardiovascular;  Laterality: N/A;   right index finger surgery  2009   SHOULDER OPEN ROTATOR CUFF REPAIR  11/21/2011   Procedure: ROTATOR CUFF REPAIR SHOULDER OPEN;  Surgeon: Reyes JAYSON Billing, MD;  Location: [facility] ORS;  Service: Orthopedics;  Laterality: Right;  with subacromial decompression   SHOULDER OPEN ROTATOR CUFF REPAIR Right 05/09/2016   Procedure: Right shoulder mini open revision rotator cuff repair, subacromial decompression;  Surgeon: Reyes Billing, MD;  Location: [facility] ORS;  Service: Orthopedics;  Laterality: Right;   TOTAL HIP ARTHROPLASTY Right 04/24/2017   Procedure: RIGHT TOTAL HIP ARTHROPLASTY ANTERIOR APPROACH;  Surgeon: Fidel Rogue, MD;  Location: [facility] ORS;  Service: Orthopedics;  Laterality: Right;  Needs RNFA   TRANSCATHETER AORTIC VALVE REPLACEMENT, TRANSFEMORAL N/A 11/13/2021   Procedure: Transcatheter Aortic Valve Replacement, Transfemoral Edwards 23 MM SAPIEN 3 Ultra;  Surgeon: Wonda Sharper, MD;  Location: [facility] OR;  Service: Open Heart Surgery;  Laterality: N/A;  Transfemoral approach    OB History     Gravida  5   Para  3   Term      Preterm      AB  2   Living  3       SAB  2   IAB      Ectopic      Multiple      Live Births  3            Home Medications    Prior to Admission medications  Medication Sig Start Date End Date Taking? Authorizing Provider  alendronate  (FOSAMAX ) 70 MG tablet 1 tablet 30 minutes before the first food, beverage or medicine of the day with plain water  Orally; Duration: 30 days    [provider]  amLODipine  (NORVASC ) 5 MG tablet TAKE 1 AND 1/2 TABLETS BY MOUTH DAILY 12/16/23   Berneta Elsie Sayre, MD  amLODipine -benazepril (LOTREL) 5-10 MG capsule 1.5 tablets Orally once a day    [provider]  aspirin  81 MG chewable tablet Chew 1 tablet (81 mg total) by mouth daily. 11/14/21   Sebastian Lamarr SAUNDERS, PA-C  Calcium  Carbonate-Vit D-Min (CALCIUM  600+D PLUS MINERALS PO) Take 2 tablets by mouth daily. Chewable    [provider]  escitalopram  (LEXAPRO ) 20 MG tablet TAKE 1 TABLET BY MOUTH DAILY 06/03/23   Berneta Elsie Sayre, MD  esomeprazole  (NEXIUM ) 40 MG capsule TAKE 1 CAPSULE BY MOUTH 2 TIMES A DAY BEFORE A MEAL 11/27/23   Berneta Elsie Sayre, MD  estradiol  (ESTRACE ) 0.1 MG/GM vaginal cream Place 1 g vaginally 2 (two) times a week. 10/02/23   Prentiss Annabella LABOR, NP  ezetimibe  (ZETIA ) 10 MG tablet TAKE 1 TABLET BY MOUTH DAILY 09/22/23   Berneta Elsie Sayre, MD  gabapentin  (NEURONTIN ) 100 MG capsule Take 100 mg by mouth 3 (three) times daily. 09/10/23   [provider]  GINKGO BILOBA PO Take 1 tablet by mouth in the morning.    [provider]  glucose blood test strip daily. 11/09/20   [provider]  HYDROcodone -acetaminophen  (NORCO/VICODIN) 5-325 MG tablet Take 1 tablet by mouth every 6 (six) hours as needed.    [provider]  levothyroxine  (SYNTHROID ) 75 MCG tablet TAKE 1 TABLET BY MOUTH DAILY WITH BREAKFAST 02/10/24   Berneta Elsie Sayre, MD  LINZESS 72 MCG capsule Take 72 mcg by mouth every morning.    [provider]  Multiple  Vitamins-Minerals (WOMENS MULTI GUMMIES PO) Take 2 tablets by mouth daily.    [provider]  oxybutynin  (DITROPAN  XL) 15 MG 24 hr tablet TAKE 1 TABLET BY MOUTH AT BEDTIME 09/26/23   Berneta Elsie Sayre, MD  oxyCODONE -acetaminophen  (PERCOCET/ROXICET) 5-325 MG tablet Take 1 tablet by mouth 4 (four) times daily as needed. 03/26/23   [provider]  Probiotic Product (PROBIOTIC PO) Take 2 capsules by mouth daily.    [provider]  rosuvastatin  (CRESTOR ) 10 MG tablet TAKE 1 TABLET BY MOUTH DAILY 12/16/23   Berneta Elsie Sayre, MD  tiZANidine  (ZANAFLEX ) 4 MG tablet Take 1 tablet (4 mg total) by mouth every 6 (six) hours as needed for muscle spasms. 05/10/23   Gillie Duncans, MD  traZODone  (DESYREL ) 50 MG tablet TAKE 1/2 TABLET BY MOUTH DAILY AT BEDTIME 11/24/23   Berneta Elsie Sayre, MD    Family History Family History  Problem Relation Age of Onset   Heart attack Mother    Stroke Father    Heart attack Father    Stroke Sister    Stroke Brother    Diabetes Brother    Dementia Brother    Heart disease Brother    Stroke Sister        TIA    Social History Social History[1]   Allergies   Other, Flexeril  [cyclobenzaprine ], Codeine, Librax [chlordiazepoxide-clidinium], and Lipitor [atorvastatin]   Review of Systems Review of Systems  Per HPI  Physical Exam Triage Vital Signs ED Triage Vitals  Encounter Vitals Group     BP 02/20/24 1616 (!) 153/71     Girls Systolic BP Percentile --      Girls Diastolic BP Percentile --      Boys Systolic BP Percentile --      Boys Diastolic BP Percentile --      Pulse Rate 02/20/24 1616 62     Resp 02/20/24 1616 15     Temp 02/20/24 1617 (!) 97.4 F (36.3 C)     Temp Source 02/20/24 1616 Oral     SpO2 02/20/24 1616 90 %     Weight --      Height --      Head  Circumference --      Peak Flow --      Pain Score --      Pain Loc --      Pain Education --      Exclude from Growth Chart --    No data  found.  Updated Vital Signs BP (!) 153/71   Pulse 62   Temp (!) 97.4 F (36.3 C)   Resp 15   SpO2 90%   Visual Acuity Right Eye Distance:   Left Eye Distance:   Bilateral Distance:    Right Eye Near:   Left Eye Near:    Bilateral Near:     Physical Exam Vitals and nursing note reviewed.  Constitutional:      Appearance: Normal appearance.  HENT:     Head: Normocephalic and atraumatic.     Right Ear: External ear normal.     Left Ear: External ear normal.     Nose: Nose normal.     Mouth/Throat:     Mouth: Mucous membranes are moist.  Eyes:     Conjunctiva/sclera: Conjunctivae normal.  Cardiovascular:     Rate and Rhythm: Normal rate and regular rhythm.     Heart sounds: Normal heart sounds. No murmur heard. Pulmonary:     Effort: Pulmonary effort is normal. No respiratory distress.     Breath sounds: Normal breath sounds. No wheezing.  Musculoskeletal:        General: Swelling and tenderness present. No signs of injury.     Right lower leg: Swelling and tenderness present. 1+ Edema present.     Comments: Diffuse tenderness along the medial and lateral lower leg.  Mild loss of ankle margins medially.  Without gross swelling of the calf.  Without pain with dorsiflexion.  Skin:    General: Skin is warm and dry.  Neurological:     General: No focal deficit present.     Mental Status: She is alert.  Psychiatric:        Mood and Affect: Mood normal.        Behavior: Behavior is cooperative.      UC Treatments / Results  Labs (all labs ordered are listed, but only abnormal results are displayed) Labs Reviewed - No data to display  EKG   Radiology No results found.  Procedures Procedures (including critical care time)  Medications Ordered in UC Medications - No data to display  Initial Impression / Assessment and Plan / UC Course  I have reviewed the triage vital signs and the nursing notes.  Pertinent labs & imaging results that were available during  my care of the patient were reviewed by me and considered in my medical decision making (see chart for details).  Vitals and triage reviewed, patient is hemodynamically stable.  Lungs vesicular, heart with regular rate and rhythm.  Some nonpitting edema of the right medial ankle with loss of bony margins.  Without calf pain on dorsiflexion.  Reassuring that it is intermittent and gets worse throughout the day.  Encourage compression stockings and elevations.  Outpatient DVT study of the right lower extremity ordered.  Patient does have a history of strokes.  Outpatient DVT ordered for tomorrow.  Plan of care, follow-up care and strict emergency precautions given if symptoms evolve or worsen.    Final Clinical Impressions(s) / UC Diagnoses   Final diagnoses:  Pain and swelling of right lower leg     Discharge Instructions      You have been scheduled  for an outpatient ultrasound tomorrow at 11 AM.  Keep your right leg elevated and uses compression stockings in the meantime.  For any pain you can take Tylenol  500 mg every 6-8 hours as needed.  Do not hesitate to seek immediate care at the nearest emergency department for any new concerning symptoms.     ED Prescriptions   None    PDMP not reviewed this encounter.     [1]  Social History Tobacco Use   Smoking status: Never   Smokeless tobacco: Never  Vaping Use   Vaping status: Never Used  Substance Use Topics   Alcohol  use: Not Currently   Drug use: No     Dreama Zollie SAILOR, FNP 02/20/24 1710  "

## 2024-02-20 NOTE — Telephone Encounter (Signed)
 Noted

## 2024-02-20 NOTE — Telephone Encounter (Signed)
 Refill request for  Trazodone  50 mg LR  11/24/23, #45, 0 rf LOV  06/10/23 FOV  none scheduled.   Please review and advise.  Thanks. Dm/cma

## 2024-02-21 ENCOUNTER — Telehealth: Payer: Self-pay | Admitting: Emergency Medicine

## 2024-02-21 ENCOUNTER — Other Ambulatory Visit: Payer: Self-pay | Admitting: Emergency Medicine

## 2024-02-21 ENCOUNTER — Ambulatory Visit (HOSPITAL_BASED_OUTPATIENT_CLINIC_OR_DEPARTMENT_OTHER)
Admission: RE | Admit: 2024-02-21 | Discharge: 2024-02-21 | Disposition: A | Discharge status: Home / Self Care | Source: Ambulatory Visit | Attending: Emergency Medicine | Admitting: Emergency Medicine

## 2024-02-21 ENCOUNTER — Other Ambulatory Visit (HOSPITAL_BASED_OUTPATIENT_CLINIC_OR_DEPARTMENT_OTHER): Payer: Self-pay | Admitting: Emergency Medicine

## 2024-02-21 ENCOUNTER — Ambulatory Visit (HOSPITAL_BASED_OUTPATIENT_CLINIC_OR_DEPARTMENT_OTHER): Admit: 2024-02-21

## 2024-02-21 DIAGNOSIS — R600 Localized edema: Secondary | ICD-10-CM

## 2024-02-21 NOTE — Telephone Encounter (Signed)
 Ultrasound at [facility] called clinic regarding order for DVT study for this patient.  We ordered DVT study for this patient as this was clinically indicated.

## 2024-02-25 ENCOUNTER — Other Ambulatory Visit: Payer: Self-pay | Admitting: Family Medicine

## 2024-02-25 DIAGNOSIS — F32A Depression, unspecified: Secondary | ICD-10-CM

## 2024-02-26 NOTE — Telephone Encounter (Signed)
 Needs OV.  Will call to scheduled. Dm/cma.

## 2024-02-26 NOTE — Telephone Encounter (Signed)
 Left VM to RTN call to schedule a f/u appt before sending refill request to provider. Dm/cma

## 2024-03-08 ENCOUNTER — Other Ambulatory Visit: Payer: Self-pay | Admitting: Family Medicine

## 2024-03-08 DIAGNOSIS — E039 Hypothyroidism, unspecified: Secondary | ICD-10-CM

## 2024-03-11 ENCOUNTER — Encounter: Payer: Self-pay | Admitting: Family Medicine

## 2024-03-11 ENCOUNTER — Ambulatory Visit: Admitting: Family Medicine

## 2024-03-11 VITALS — BP 116/60 | HR 59 | Temp 97.9°F | Ht 63.0 in | Wt 140.8 lb

## 2024-03-11 DIAGNOSIS — F32A Depression, unspecified: Secondary | ICD-10-CM | POA: Diagnosis not present

## 2024-03-11 DIAGNOSIS — I10 Essential (primary) hypertension: Secondary | ICD-10-CM

## 2024-03-11 DIAGNOSIS — E7800 Pure hypercholesterolemia, unspecified: Secondary | ICD-10-CM

## 2024-03-11 NOTE — Progress Notes (Signed)
 "  Established Patient Office Visit   Subjective:  Patient ID: Cheryl Cooper, female    DOB: 12/20/1940  Age: 84 y.o. MRN: [id number]  Chief Complaint  Patient presents with   Medical Management of Chronic Issues    Follow-up medications    HPI Encounter Diagnoses  Name Primary?   Elevated LDL cholesterol level Yes   Depression, unspecified depression type    Essential hypertension    Continues rosuvastatin  and Zetia  for elevated cholesterol.  Continues amlodipine  5 mg and amlodipine  5 mg with benazepril 10 mg for control of hypertension.  Continues escitalopram  for depression.  This has been a stressful time with the apparent return of her husband's cancer.  Results for him are pending.  Continues follow-up with endocrinology diet-controlled type 2 diabetes.   Review of Systems  Constitutional: Negative.   HENT: Negative.    Eyes:  Negative for blurred vision, discharge and redness.  Respiratory: Negative.    Cardiovascular: Negative.   Gastrointestinal:  Negative for abdominal pain.  Genitourinary: Negative.   Musculoskeletal: Negative.  Negative for myalgias.  Skin:  Negative for rash.  Neurological:  Negative for tingling, loss of consciousness and weakness.  Endo/Heme/Allergies:  Negative for polydipsia.      03/11/2024    1:05 PM 06/10/2023   10:30 AM 12/02/2022   10:11 AM  Depression screen PHQ 2/9  Decreased Interest 1 1 0  Down, Depressed, Hopeless 1 1 0  PHQ - 2 Score 2 2 0  Altered sleeping 0 1   Tired, decreased energy 0 1   Change in appetite 0 0   Feeling bad or failure about yourself  2 2   Trouble concentrating 0 0   Moving slowly or fidgety/restless 1 0   Suicidal thoughts 0 0   PHQ-9 Score 5 6    Difficult doing work/chores Somewhat difficult Not difficult at all      Data saved with a previous flowsheet row definition     Current Medications[1]   Objective:     BP 116/60   Pulse (!) 59   Temp 97.9 F (36.6 C)   Ht 5' 3 (1.6 m)   Wt  140 lb 12.8 oz (63.9 kg)   BMI 24.94 kg/m  BP Readings from Last 3 Encounters:  03/11/24 116/60  02/20/24 (!) 153/71  02/06/24 121/72   Wt Readings from Last 3 Encounters:  03/11/24 140 lb 12.8 oz (63.9 kg)  06/10/23 136 lb 9.6 oz (62 kg)  05/09/23 134 lb (60.8 kg)      Physical Exam Constitutional:      General: She is not in acute distress.    Appearance: Normal appearance. She is not ill-appearing, toxic-appearing or diaphoretic.  HENT:     Head: Normocephalic and atraumatic.     Right Ear: External ear normal.     Left Ear: External ear normal.     Mouth/Throat:     Mouth: Mucous membranes are moist.     Pharynx: Oropharynx is clear. No oropharyngeal exudate or posterior oropharyngeal erythema.  Eyes:     General: No scleral icterus.       Right eye: No discharge.        Left eye: No discharge.     Extraocular Movements: Extraocular movements intact.     Conjunctiva/sclera: Conjunctivae normal.     Pupils: Pupils are equal, round, and reactive to light.  Cardiovascular:     Rate and Rhythm: Normal rate and regular rhythm.  Pulmonary:  Effort: Pulmonary effort is normal. No respiratory distress.     Breath sounds: Normal breath sounds. No wheezing or rales.  Musculoskeletal:     Cervical back: No rigidity or tenderness.  Lymphadenopathy:     Cervical: No cervical adenopathy.  Skin:    General: Skin is warm and dry.  Neurological:     Mental Status: She is alert and oriented to person, place, and time.  Psychiatric:        Mood and Affect: Mood normal.        Behavior: Behavior normal.      No results found for any visits on 03/11/24.    The ASCVD Risk score (Arnett DK, et al., 2019) failed to calculate for the following reasons:   The 2019 ASCVD risk score is only valid for ages 34 to 3   Risk score cannot be calculated because patient has a medical history suggesting prior/existing ASCVD   * - Cholesterol units were assumed    Assessment & Plan:    Elevated LDL cholesterol level -     Comprehensive metabolic panel with GFR; Future -     Lipid panel; Future  Depression, unspecified depression type  Essential hypertension -     CBC; Future -     Comprehensive metabolic panel with GFR; Future    Return in about 6 months (around 09/08/2024), or Will work on fasting for blood work..  Continue current medications and follow-up in 6 months or sooner if needed.  Cheryl Sim Lent, MD    [1]  Current Outpatient Medications:    alendronate  (FOSAMAX ) 70 MG tablet, 1 tablet 30 minutes before the first food, beverage or medicine of the day with plain water  Orally; Duration: 30 days, Disp: , Rfl:    amLODipine  (NORVASC ) 5 MG tablet, TAKE 1 AND 1/2 TABLETS BY MOUTH DAILY, Disp: 135 tablet, Rfl: 2   amLODipine -benazepril (LOTREL) 5-10 MG capsule, 1.5 tablets Orally once a day, Disp: , Rfl:    aspirin  81 MG chewable tablet, Chew 1 tablet (81 mg total) by mouth daily., Disp: , Rfl:    Calcium  Carbonate-Vit D-Min (CALCIUM  600+D PLUS MINERALS PO), Take 2 tablets by mouth daily. Chewable, Disp: , Rfl:    escitalopram  (LEXAPRO ) 20 MG tablet, TAKE 1 TABLET BY MOUTH DAILY, Disp: 90 tablet, Rfl: 0   esomeprazole  (NEXIUM ) 40 MG capsule, TAKE 1 CAPSULE BY MOUTH 2 TIMES A DAY BEFORE A MEAL, Disp: 30 capsule, Rfl: 0   estradiol  (ESTRACE ) 0.1 MG/GM vaginal cream, Place 1 g vaginally 2 (two) times a week., Disp: 42.5 g, Rfl: 3   ezetimibe  (ZETIA ) 10 MG tablet, TAKE 1 TABLET BY MOUTH DAILY, Disp: 90 tablet, Rfl: 2   GINKGO BILOBA PO, Take 1 tablet by mouth in the morning., Disp: , Rfl:    glucose blood test strip, daily., Disp: , Rfl:    HYDROcodone -acetaminophen  (NORCO/VICODIN) 5-325 MG tablet, Take 1 tablet by mouth every 6 (six) hours as needed., Disp: , Rfl:    levothyroxine  (SYNTHROID ) 75 MCG tablet, TAKE 1 TABLET BY MOUTH DAILY WITH BREAKFAST **PLEASE SCHEDULE APPOINTMENT FOR REFILLS**, Disp: 30 tablet, Rfl: 0   LINZESS 72 MCG capsule, Take 72  mcg by mouth every morning., Disp: , Rfl:    Multiple Vitamins-Minerals (WOMENS MULTI GUMMIES PO), Take 2 tablets by mouth daily., Disp: , Rfl:    oxybutynin  (DITROPAN  XL) 15 MG 24 hr tablet, TAKE 1 TABLET BY MOUTH AT BEDTIME, Disp: 90 tablet, Rfl: 1   oxyCODONE -acetaminophen  (PERCOCET/ROXICET) 5-325  MG tablet, Take 1 tablet by mouth 4 (four) times daily as needed., Disp: , Rfl:    Probiotic Product (PROBIOTIC PO), Take 2 capsules by mouth daily., Disp: , Rfl:    rosuvastatin  (CRESTOR ) 10 MG tablet, TAKE 1 TABLET BY MOUTH DAILY, Disp: 90 tablet, Rfl: 2   traZODone  (DESYREL ) 50 MG tablet, TAKE 1/2 TABLET BY MOUTH DAILY AT BEDTIME, Disp: 45 tablet, Rfl: 0   gabapentin  (NEURONTIN ) 100 MG capsule, Take 100 mg by mouth 3 (three) times daily., Disp: , Rfl:    tiZANidine  (ZANAFLEX ) 4 MG tablet, Take 1 tablet (4 mg total) by mouth every 6 (six) hours as needed for muscle spasms. (Patient not taking: Reported on 03/11/2024), Disp: 60 tablet, Rfl: 0  "

## 2024-03-12 ENCOUNTER — Other Ambulatory Visit (INDEPENDENT_AMBULATORY_CARE_PROVIDER_SITE_OTHER)

## 2024-03-12 DIAGNOSIS — E7800 Pure hypercholesterolemia, unspecified: Secondary | ICD-10-CM

## 2024-03-12 DIAGNOSIS — I10 Essential (primary) hypertension: Secondary | ICD-10-CM

## 2024-03-12 LAB — COMPREHENSIVE METABOLIC PANEL WITH GFR
ALT: 32 U/L (ref 3–35)
AST: 29 U/L (ref 5–37)
Albumin: 3.9 g/dL (ref 3.5–5.2)
Alkaline Phosphatase: 37 U/L — ABNORMAL LOW (ref 39–117)
BUN: 20 mg/dL (ref 6–23)
CO2: 29 meq/L (ref 19–32)
Calcium: 9.3 mg/dL (ref 8.4–10.5)
Chloride: 106 meq/L (ref 96–112)
Creatinine, Ser: 0.91 mg/dL (ref 0.40–1.20)
GFR: 59.08 mL/min — ABNORMAL LOW
Glucose, Bld: 108 mg/dL — ABNORMAL HIGH (ref 70–99)
Potassium: 4.2 meq/L (ref 3.5–5.1)
Sodium: 140 meq/L (ref 135–145)
Total Bilirubin: 0.3 mg/dL (ref 0.2–1.2)
Total Protein: 7 g/dL (ref 6.0–8.3)

## 2024-03-12 LAB — LIPID PANEL
Cholesterol: 111 mg/dL (ref 28–200)
HDL: 47.4 mg/dL
LDL Cholesterol: 38 mg/dL (ref 10–99)
NonHDL: 63.56
Total CHOL/HDL Ratio: 2
Triglycerides: 128 mg/dL (ref 10.0–149.0)
VLDL: 25.6 mg/dL (ref 0.0–40.0)

## 2024-03-12 LAB — CBC
HCT: 40 % (ref 36.0–46.0)
Hemoglobin: 13.4 g/dL (ref 12.0–15.0)
MCHC: 33.5 g/dL (ref 30.0–36.0)
MCV: 90.5 fl (ref 78.0–100.0)
Platelets: 182 K/uL (ref 150.0–400.0)
RBC: 4.42 Mil/uL (ref 3.87–5.11)
RDW: 13.8 % (ref 11.5–15.5)
WBC: 7.3 K/uL (ref 4.0–10.5)

## 2024-03-15 ENCOUNTER — Ambulatory Visit: Payer: Self-pay | Admitting: Family Medicine
